# Patient Record
Sex: Female | Born: 1945 | Race: White | Hispanic: No | Marital: Married | Smoking: Former smoker
Health system: Southern US, Community
[De-identification: ages and names within clinical notes are randomized; demographics above are authoritative.]

## PROBLEM LIST (undated history)

## (undated) DIAGNOSIS — Z9221 Personal history of antineoplastic chemotherapy: Secondary | ICD-10-CM

## (undated) DIAGNOSIS — Z9889 Other specified postprocedural states: Secondary | ICD-10-CM

## (undated) DIAGNOSIS — M797 Fibromyalgia: Secondary | ICD-10-CM

## (undated) DIAGNOSIS — J302 Other seasonal allergic rhinitis: Secondary | ICD-10-CM

## (undated) DIAGNOSIS — N393 Stress incontinence (female) (male): Secondary | ICD-10-CM

## (undated) DIAGNOSIS — F32A Depression, unspecified: Secondary | ICD-10-CM

## (undated) DIAGNOSIS — F329 Major depressive disorder, single episode, unspecified: Secondary | ICD-10-CM

## (undated) DIAGNOSIS — H3530 Unspecified macular degeneration: Secondary | ICD-10-CM

## (undated) DIAGNOSIS — R112 Nausea with vomiting, unspecified: Secondary | ICD-10-CM

## (undated) DIAGNOSIS — R208 Other disturbances of skin sensation: Secondary | ICD-10-CM

## (undated) DIAGNOSIS — Z860100 Personal history of colon polyps, unspecified: Secondary | ICD-10-CM

## (undated) DIAGNOSIS — C519 Malignant neoplasm of vulva, unspecified: Secondary | ICD-10-CM

## (undated) DIAGNOSIS — Z973 Presence of spectacles and contact lenses: Secondary | ICD-10-CM

## (undated) DIAGNOSIS — Z853 Personal history of malignant neoplasm of breast: Secondary | ICD-10-CM

## (undated) DIAGNOSIS — M1990 Unspecified osteoarthritis, unspecified site: Secondary | ICD-10-CM

## (undated) DIAGNOSIS — K219 Gastro-esophageal reflux disease without esophagitis: Secondary | ICD-10-CM

## (undated) DIAGNOSIS — R0602 Shortness of breath: Secondary | ICD-10-CM

## (undated) DIAGNOSIS — Z923 Personal history of irradiation: Secondary | ICD-10-CM

## (undated) DIAGNOSIS — E039 Hypothyroidism, unspecified: Secondary | ICD-10-CM

## (undated) DIAGNOSIS — K5909 Other constipation: Secondary | ICD-10-CM

## (undated) DIAGNOSIS — Z8719 Personal history of other diseases of the digestive system: Secondary | ICD-10-CM

## (undated) DIAGNOSIS — J45909 Unspecified asthma, uncomplicated: Secondary | ICD-10-CM

## (undated) DIAGNOSIS — C4499 Other specified malignant neoplasm of skin, unspecified: Secondary | ICD-10-CM

## (undated) DIAGNOSIS — Z8711 Personal history of peptic ulcer disease: Secondary | ICD-10-CM

## (undated) DIAGNOSIS — F419 Anxiety disorder, unspecified: Secondary | ICD-10-CM

## (undated) DIAGNOSIS — R200 Anesthesia of skin: Secondary | ICD-10-CM

## (undated) HISTORY — PX: CARDIAC CATHETERIZATION: SHX172

## (undated) HISTORY — PX: KNEE ARTHROSCOPY: SUR90

## (undated) HISTORY — PX: DIAGNOSTIC LAPAROSCOPY: SUR761

## (undated) HISTORY — PX: ROTATOR CUFF REPAIR: SHX139

## (undated) HISTORY — DX: Fibromyalgia: M79.7

## (undated) HISTORY — DX: Gastro-esophageal reflux disease without esophagitis: K21.9

## (undated) HISTORY — PX: TOTAL KNEE ARTHROPLASTY: SHX125

## (undated) HISTORY — DX: Shortness of breath: R06.02

---

## 1966-05-26 HISTORY — PX: TONSILLECTOMY: SUR1361

## 1966-05-26 HISTORY — PX: DILATION AND CURETTAGE OF UTERUS: SHX78

## 1981-05-26 HISTORY — PX: THYROIDECTOMY, PARTIAL: SHX18

## 1991-05-27 HISTORY — PX: OTHER SURGICAL HISTORY: SHX169

## 1995-05-27 DIAGNOSIS — Z9221 Personal history of antineoplastic chemotherapy: Secondary | ICD-10-CM

## 1995-05-27 HISTORY — PX: MASTECTOMY: SHX3

## 1995-05-27 HISTORY — PX: OTHER SURGICAL HISTORY: SHX169

## 1995-05-27 HISTORY — DX: Personal history of antineoplastic chemotherapy: Z92.21

## 1996-05-26 HISTORY — PX: LUMBAR SPINE SURGERY: SHX701

## 1997-10-16 ENCOUNTER — Ambulatory Visit (HOSPITAL_BASED_OUTPATIENT_CLINIC_OR_DEPARTMENT_OTHER): Admission: RE | Admit: 1997-10-16 | Discharge: 1997-10-16 | Payer: Self-pay | Admitting: Specialist

## 1998-01-24 ENCOUNTER — Ambulatory Visit (HOSPITAL_COMMUNITY): Admission: RE | Admit: 1998-01-24 | Discharge: 1998-01-24 | Payer: Self-pay | Admitting: Family Medicine

## 1999-01-30 ENCOUNTER — Other Ambulatory Visit: Admission: RE | Admit: 1999-01-30 | Discharge: 1999-01-30 | Payer: Self-pay | Admitting: Family Medicine

## 1999-02-06 ENCOUNTER — Ambulatory Visit (HOSPITAL_COMMUNITY): Admission: RE | Admit: 1999-02-06 | Discharge: 1999-02-06 | Payer: Self-pay | Admitting: Family Medicine

## 1999-02-06 ENCOUNTER — Encounter: Payer: Self-pay | Admitting: Family Medicine

## 1999-02-13 ENCOUNTER — Encounter: Payer: Self-pay | Admitting: Family Medicine

## 1999-02-13 ENCOUNTER — Ambulatory Visit (HOSPITAL_COMMUNITY): Admission: RE | Admit: 1999-02-13 | Discharge: 1999-02-13 | Payer: Self-pay | Admitting: Family Medicine

## 1999-04-15 ENCOUNTER — Ambulatory Visit (HOSPITAL_COMMUNITY): Admission: RE | Admit: 1999-04-15 | Discharge: 1999-04-15 | Payer: Self-pay | Admitting: *Deleted

## 1999-06-03 ENCOUNTER — Encounter (INDEPENDENT_AMBULATORY_CARE_PROVIDER_SITE_OTHER): Payer: Self-pay | Admitting: Specialist

## 1999-06-03 ENCOUNTER — Ambulatory Visit (HOSPITAL_BASED_OUTPATIENT_CLINIC_OR_DEPARTMENT_OTHER): Admission: RE | Admit: 1999-06-03 | Discharge: 1999-06-03 | Payer: Self-pay | Admitting: Specialist

## 1999-06-03 HISTORY — PX: OTHER SURGICAL HISTORY: SHX169

## 1999-07-24 ENCOUNTER — Encounter: Admission: RE | Admit: 1999-07-24 | Discharge: 1999-07-24 | Payer: Self-pay | Admitting: Oncology

## 1999-07-24 ENCOUNTER — Encounter: Payer: Self-pay | Admitting: Oncology

## 2000-03-23 ENCOUNTER — Encounter: Payer: Self-pay | Admitting: Oncology

## 2000-03-23 ENCOUNTER — Encounter: Admission: RE | Admit: 2000-03-23 | Discharge: 2000-03-23 | Payer: Self-pay | Admitting: Oncology

## 2000-04-28 ENCOUNTER — Encounter: Payer: Self-pay | Admitting: Orthopedic Surgery

## 2000-04-29 ENCOUNTER — Encounter: Admission: RE | Admit: 2000-04-29 | Discharge: 2000-04-29 | Payer: Self-pay | Admitting: Oncology

## 2000-04-29 ENCOUNTER — Encounter: Payer: Self-pay | Admitting: Oncology

## 2000-05-04 ENCOUNTER — Inpatient Hospital Stay (HOSPITAL_COMMUNITY): Admission: RE | Admit: 2000-05-04 | Discharge: 2000-05-07 | Payer: Self-pay | Admitting: Orthopedic Surgery

## 2000-05-05 ENCOUNTER — Encounter: Payer: Self-pay | Admitting: Orthopedic Surgery

## 2000-05-06 ENCOUNTER — Encounter: Payer: Self-pay | Admitting: Orthopedic Surgery

## 2000-05-07 ENCOUNTER — Inpatient Hospital Stay (HOSPITAL_COMMUNITY)
Admission: RE | Admit: 2000-05-07 | Discharge: 2000-05-11 | Payer: Self-pay | Admitting: Physical Medicine & Rehabilitation

## 2000-06-09 ENCOUNTER — Encounter: Admission: RE | Admit: 2000-06-09 | Discharge: 2000-08-11 | Payer: Self-pay | Admitting: Orthopedic Surgery

## 2000-07-30 ENCOUNTER — Encounter: Payer: Self-pay | Admitting: Oncology

## 2000-07-30 ENCOUNTER — Ambulatory Visit (HOSPITAL_COMMUNITY): Admission: RE | Admit: 2000-07-30 | Discharge: 2000-07-30 | Payer: Self-pay | Admitting: Oncology

## 2000-08-07 ENCOUNTER — Ambulatory Visit (HOSPITAL_COMMUNITY): Admission: RE | Admit: 2000-08-07 | Discharge: 2000-08-07 | Payer: Self-pay | Admitting: Oncology

## 2000-08-07 ENCOUNTER — Encounter: Payer: Self-pay | Admitting: Oncology

## 2000-10-08 ENCOUNTER — Ambulatory Visit: Admission: RE | Admit: 2000-10-08 | Discharge: 2000-10-08 | Payer: Self-pay | Admitting: Orthopedic Surgery

## 2000-10-30 ENCOUNTER — Ambulatory Visit (HOSPITAL_COMMUNITY): Admission: RE | Admit: 2000-10-30 | Discharge: 2000-10-31 | Payer: Self-pay | Admitting: Cardiology

## 2000-12-22 ENCOUNTER — Inpatient Hospital Stay (HOSPITAL_COMMUNITY): Admission: EM | Admit: 2000-12-22 | Discharge: 2000-12-25 | Payer: Self-pay | Admitting: *Deleted

## 2001-03-29 ENCOUNTER — Inpatient Hospital Stay (HOSPITAL_COMMUNITY): Admission: RE | Admit: 2001-03-29 | Discharge: 2001-04-02 | Payer: Self-pay | Admitting: Orthopedic Surgery

## 2001-04-08 ENCOUNTER — Ambulatory Visit: Admission: RE | Admit: 2001-04-08 | Discharge: 2001-04-08 | Payer: Self-pay | Admitting: Orthopedic Surgery

## 2001-04-14 ENCOUNTER — Encounter: Payer: Self-pay | Admitting: Orthopedic Surgery

## 2001-04-14 ENCOUNTER — Inpatient Hospital Stay (HOSPITAL_COMMUNITY): Admission: RE | Admit: 2001-04-14 | Discharge: 2001-04-17 | Payer: Self-pay | Admitting: Orthopedic Surgery

## 2001-04-14 HISTORY — PX: OTHER SURGICAL HISTORY: SHX169

## 2001-04-20 ENCOUNTER — Inpatient Hospital Stay (HOSPITAL_COMMUNITY): Admission: EM | Admit: 2001-04-20 | Discharge: 2001-04-25 | Payer: Self-pay | Admitting: Orthopedic Surgery

## 2001-05-24 ENCOUNTER — Ambulatory Visit: Admission: RE | Admit: 2001-05-24 | Discharge: 2001-05-24 | Payer: Self-pay | Admitting: Orthopedic Surgery

## 2001-06-14 ENCOUNTER — Encounter: Admission: RE | Admit: 2001-06-14 | Discharge: 2001-09-12 | Payer: Self-pay | Admitting: Orthopedic Surgery

## 2001-09-13 ENCOUNTER — Encounter: Admission: RE | Admit: 2001-09-13 | Discharge: 2001-12-12 | Payer: Self-pay | Admitting: Orthopedic Surgery

## 2001-12-09 ENCOUNTER — Encounter: Payer: Self-pay | Admitting: Orthopedic Surgery

## 2001-12-10 ENCOUNTER — Inpatient Hospital Stay (HOSPITAL_COMMUNITY): Admission: RE | Admit: 2001-12-10 | Discharge: 2001-12-18 | Payer: Self-pay | Admitting: Orthopedic Surgery

## 2001-12-13 ENCOUNTER — Encounter: Payer: Self-pay | Admitting: Orthopedic Surgery

## 2001-12-14 ENCOUNTER — Encounter: Payer: Self-pay | Admitting: Orthopedic Surgery

## 2001-12-17 ENCOUNTER — Encounter: Payer: Self-pay | Admitting: Orthopedic Surgery

## 2001-12-30 ENCOUNTER — Encounter: Payer: Self-pay | Admitting: Orthopedic Surgery

## 2001-12-30 ENCOUNTER — Ambulatory Visit (HOSPITAL_BASED_OUTPATIENT_CLINIC_OR_DEPARTMENT_OTHER): Admission: RE | Admit: 2001-12-30 | Discharge: 2001-12-30 | Payer: Self-pay | Admitting: Orthopedic Surgery

## 2002-01-13 ENCOUNTER — Encounter: Payer: Self-pay | Admitting: Orthopedic Surgery

## 2002-01-14 ENCOUNTER — Encounter: Payer: Self-pay | Admitting: Orthopedic Surgery

## 2002-01-14 ENCOUNTER — Inpatient Hospital Stay (HOSPITAL_COMMUNITY): Admission: RE | Admit: 2002-01-14 | Discharge: 2002-01-18 | Payer: Self-pay | Admitting: Orthopedic Surgery

## 2002-01-18 ENCOUNTER — Inpatient Hospital Stay (HOSPITAL_COMMUNITY)
Admission: RE | Admit: 2002-01-18 | Discharge: 2002-01-21 | Payer: Self-pay | Admitting: Physical Medicine & Rehabilitation

## 2002-02-22 ENCOUNTER — Encounter: Admission: RE | Admit: 2002-02-22 | Discharge: 2002-02-22 | Payer: Self-pay | Admitting: Family Medicine

## 2002-02-22 ENCOUNTER — Encounter: Payer: Self-pay | Admitting: Family Medicine

## 2002-07-28 ENCOUNTER — Encounter: Payer: Self-pay | Admitting: Family Medicine

## 2002-07-28 ENCOUNTER — Encounter: Admission: RE | Admit: 2002-07-28 | Discharge: 2002-07-28 | Payer: Self-pay | Admitting: Family Medicine

## 2002-10-27 ENCOUNTER — Other Ambulatory Visit: Admission: RE | Admit: 2002-10-27 | Discharge: 2002-10-27 | Payer: Self-pay | Admitting: *Deleted

## 2002-11-03 ENCOUNTER — Ambulatory Visit (HOSPITAL_COMMUNITY): Admission: RE | Admit: 2002-11-03 | Discharge: 2002-11-03 | Payer: Self-pay | Admitting: *Deleted

## 2002-11-17 ENCOUNTER — Encounter (INDEPENDENT_AMBULATORY_CARE_PROVIDER_SITE_OTHER): Payer: Self-pay | Admitting: Specialist

## 2002-11-17 ENCOUNTER — Ambulatory Visit (HOSPITAL_COMMUNITY): Admission: RE | Admit: 2002-11-17 | Discharge: 2002-11-17 | Payer: Self-pay | Admitting: *Deleted

## 2003-02-24 ENCOUNTER — Encounter: Payer: Self-pay | Admitting: Oncology

## 2003-02-24 ENCOUNTER — Encounter: Admission: RE | Admit: 2003-02-24 | Discharge: 2003-02-24 | Payer: Self-pay | Admitting: Oncology

## 2003-05-27 DIAGNOSIS — Z923 Personal history of irradiation: Secondary | ICD-10-CM

## 2003-05-27 HISTORY — DX: Personal history of irradiation: Z92.3

## 2003-09-25 ENCOUNTER — Encounter: Admission: RE | Admit: 2003-09-25 | Discharge: 2003-09-25 | Payer: Self-pay | Admitting: Orthopedic Surgery

## 2004-03-11 ENCOUNTER — Encounter: Admission: RE | Admit: 2004-03-11 | Discharge: 2004-03-11 | Payer: Self-pay | Admitting: *Deleted

## 2004-03-15 ENCOUNTER — Encounter (INDEPENDENT_AMBULATORY_CARE_PROVIDER_SITE_OTHER): Payer: Self-pay | Admitting: *Deleted

## 2004-03-15 ENCOUNTER — Encounter: Admission: RE | Admit: 2004-03-15 | Discharge: 2004-03-15 | Payer: Self-pay | Admitting: Oncology

## 2004-03-15 ENCOUNTER — Encounter (INDEPENDENT_AMBULATORY_CARE_PROVIDER_SITE_OTHER): Payer: Self-pay | Admitting: Radiology

## 2004-03-19 HISTORY — PX: BREAST BIOPSY: SHX20

## 2004-03-20 ENCOUNTER — Encounter (HOSPITAL_COMMUNITY): Admission: RE | Admit: 2004-03-20 | Discharge: 2004-06-18 | Payer: Self-pay | Admitting: Oncology

## 2004-03-27 ENCOUNTER — Encounter: Admission: RE | Admit: 2004-03-27 | Discharge: 2004-03-27 | Payer: Self-pay | Admitting: General Surgery

## 2004-03-27 ENCOUNTER — Ambulatory Visit (HOSPITAL_COMMUNITY): Admission: RE | Admit: 2004-03-27 | Discharge: 2004-03-27 | Payer: Self-pay | Admitting: General Surgery

## 2004-03-27 ENCOUNTER — Encounter (INDEPENDENT_AMBULATORY_CARE_PROVIDER_SITE_OTHER): Payer: Self-pay | Admitting: *Deleted

## 2004-03-27 ENCOUNTER — Ambulatory Visit (HOSPITAL_BASED_OUTPATIENT_CLINIC_OR_DEPARTMENT_OTHER): Admission: RE | Admit: 2004-03-27 | Discharge: 2004-03-27 | Payer: Self-pay | Admitting: General Surgery

## 2004-03-27 HISTORY — PX: BREAST LUMPECTOMY: SHX2

## 2004-04-09 ENCOUNTER — Ambulatory Visit: Admission: RE | Admit: 2004-04-09 | Discharge: 2004-06-21 | Payer: Self-pay | Admitting: Radiation Oncology

## 2004-04-12 ENCOUNTER — Ambulatory Visit: Payer: Self-pay | Admitting: Oncology

## 2004-05-31 ENCOUNTER — Ambulatory Visit: Payer: Self-pay | Admitting: Oncology

## 2004-07-17 ENCOUNTER — Ambulatory Visit: Admission: RE | Admit: 2004-07-17 | Discharge: 2004-07-17 | Payer: Self-pay | Admitting: Radiation Oncology

## 2004-08-15 ENCOUNTER — Ambulatory Visit: Payer: Self-pay | Admitting: Oncology

## 2004-09-10 ENCOUNTER — Encounter: Admission: RE | Admit: 2004-09-10 | Discharge: 2004-09-10 | Payer: Self-pay | Admitting: Oncology

## 2004-10-17 ENCOUNTER — Ambulatory Visit: Payer: Self-pay | Admitting: Oncology

## 2004-12-19 ENCOUNTER — Ambulatory Visit: Payer: Self-pay | Admitting: Oncology

## 2005-02-13 ENCOUNTER — Ambulatory Visit: Payer: Self-pay | Admitting: Oncology

## 2005-02-20 ENCOUNTER — Encounter: Admission: RE | Admit: 2005-02-20 | Discharge: 2005-02-20 | Payer: Self-pay | Admitting: Radiation Oncology

## 2005-07-01 ENCOUNTER — Encounter: Admission: RE | Admit: 2005-07-01 | Discharge: 2005-07-11 | Payer: Self-pay | Admitting: Orthopedic Surgery

## 2005-07-07 ENCOUNTER — Encounter (INDEPENDENT_AMBULATORY_CARE_PROVIDER_SITE_OTHER): Payer: Self-pay | Admitting: Specialist

## 2005-07-07 ENCOUNTER — Inpatient Hospital Stay (HOSPITAL_COMMUNITY): Admission: RE | Admit: 2005-07-07 | Discharge: 2005-07-11 | Payer: Self-pay | Admitting: Orthopedic Surgery

## 2005-07-07 ENCOUNTER — Ambulatory Visit: Payer: Self-pay | Admitting: Physical Medicine & Rehabilitation

## 2005-07-07 HISTORY — PX: REVISION TOTAL KNEE ARTHROPLASTY: SUR1280

## 2005-08-14 ENCOUNTER — Ambulatory Visit: Payer: Self-pay | Admitting: Oncology

## 2005-09-09 ENCOUNTER — Encounter: Admission: RE | Admit: 2005-09-09 | Discharge: 2005-09-09 | Payer: Self-pay | Admitting: Oncology

## 2005-09-24 ENCOUNTER — Encounter: Admission: RE | Admit: 2005-09-24 | Discharge: 2005-09-24 | Payer: Self-pay | Admitting: Oncology

## 2006-08-11 ENCOUNTER — Ambulatory Visit: Payer: Self-pay | Admitting: Oncology

## 2006-08-13 LAB — CBC WITH DIFFERENTIAL/PLATELET
Basophils Absolute: 0.1 10*3/uL (ref 0.0–0.1)
Eosinophils Absolute: 0.1 10*3/uL (ref 0.0–0.5)
HGB: 13.3 g/dL (ref 11.6–15.9)
MCV: 83.4 fL (ref 81.0–101.0)
MONO%: 6.2 % (ref 0.0–13.0)
NEUT#: 2.5 10*3/uL (ref 1.5–6.5)
Platelets: 156 10*3/uL (ref 145–400)
RDW: 14.1 % (ref 11.3–14.5)

## 2006-08-13 LAB — MORPHOLOGY

## 2006-08-13 LAB — URIC ACID: Uric Acid, Serum: 4.4 mg/dL (ref 2.4–7.0)

## 2006-08-13 LAB — LACTATE DEHYDROGENASE: LDH: 159 U/L (ref 94–250)

## 2006-08-22 ENCOUNTER — Ambulatory Visit (HOSPITAL_COMMUNITY): Admission: RE | Admit: 2006-08-22 | Discharge: 2006-08-22 | Payer: Self-pay | Admitting: Oncology

## 2006-09-14 ENCOUNTER — Encounter: Admission: RE | Admit: 2006-09-14 | Discharge: 2006-09-14 | Payer: Self-pay | Admitting: Oncology

## 2007-03-05 ENCOUNTER — Ambulatory Visit: Payer: Self-pay | Admitting: Oncology

## 2007-03-09 LAB — COMPREHENSIVE METABOLIC PANEL
ALT: 21 U/L (ref 0–35)
AST: 17 U/L (ref 0–37)
Albumin: 4.4 g/dL (ref 3.5–5.2)
Calcium: 9 mg/dL (ref 8.4–10.5)
Chloride: 102 mEq/L (ref 96–112)
Creatinine, Ser: 0.74 mg/dL (ref 0.40–1.20)
Potassium: 4.2 mEq/L (ref 3.5–5.3)

## 2007-03-09 LAB — CBC WITH DIFFERENTIAL/PLATELET
BASO%: 0.5 % (ref 0.0–2.0)
EOS%: 1 % (ref 0.0–7.0)
MCH: 30.1 pg (ref 26.0–34.0)
MCHC: 35.2 g/dL (ref 32.0–36.0)
RDW: 13.5 % (ref 11.3–14.5)
lymph#: 1.2 10*3/uL (ref 0.9–3.3)

## 2007-05-07 ENCOUNTER — Ambulatory Visit (HOSPITAL_COMMUNITY): Admission: RE | Admit: 2007-05-07 | Discharge: 2007-05-07 | Payer: Self-pay | Admitting: Gastroenterology

## 2007-07-19 ENCOUNTER — Observation Stay (HOSPITAL_COMMUNITY): Admission: EM | Admit: 2007-07-19 | Discharge: 2007-07-20 | Payer: Self-pay | Admitting: Emergency Medicine

## 2007-08-04 ENCOUNTER — Encounter: Payer: Self-pay | Admitting: Pulmonary Disease

## 2007-08-23 ENCOUNTER — Ambulatory Visit: Payer: Self-pay | Admitting: Pulmonary Disease

## 2007-08-23 DIAGNOSIS — J45909 Unspecified asthma, uncomplicated: Secondary | ICD-10-CM

## 2007-08-23 DIAGNOSIS — Z85828 Personal history of other malignant neoplasm of skin: Secondary | ICD-10-CM | POA: Insufficient documentation

## 2007-08-23 DIAGNOSIS — R05 Cough: Secondary | ICD-10-CM

## 2007-08-23 DIAGNOSIS — R059 Cough, unspecified: Secondary | ICD-10-CM | POA: Insufficient documentation

## 2007-08-23 DIAGNOSIS — J309 Allergic rhinitis, unspecified: Secondary | ICD-10-CM

## 2007-08-23 HISTORY — DX: Cough, unspecified: R05.9

## 2007-08-23 HISTORY — DX: Allergic rhinitis, unspecified: J30.9

## 2007-08-23 HISTORY — DX: Personal history of other malignant neoplasm of skin: Z85.828

## 2007-08-23 HISTORY — DX: Unspecified asthma, uncomplicated: J45.909

## 2007-09-15 ENCOUNTER — Encounter: Admission: RE | Admit: 2007-09-15 | Discharge: 2007-09-15 | Payer: Self-pay | Admitting: Oncology

## 2007-09-22 ENCOUNTER — Ambulatory Visit: Payer: Self-pay | Admitting: Oncology

## 2007-09-27 LAB — COMPREHENSIVE METABOLIC PANEL
AST: 15 U/L (ref 0–37)
Alkaline Phosphatase: 101 U/L (ref 39–117)
BUN: 18 mg/dL (ref 6–23)
Calcium: 9.2 mg/dL (ref 8.4–10.5)
Creatinine, Ser: 0.75 mg/dL (ref 0.40–1.20)

## 2007-09-27 LAB — CBC WITH DIFFERENTIAL/PLATELET
Basophils Absolute: 0 10*3/uL (ref 0.0–0.1)
EOS%: 1.2 % (ref 0.0–7.0)
HCT: 37.5 % (ref 34.8–46.6)
HGB: 12.8 g/dL (ref 11.6–15.9)
LYMPH%: 25.3 % (ref 14.0–48.0)
MCH: 29.4 pg (ref 26.0–34.0)
MCV: 86.4 fL (ref 81.0–101.0)
MONO%: 6.6 % (ref 0.0–13.0)
NEUT%: 66.3 % (ref 39.6–76.8)

## 2007-09-28 ENCOUNTER — Encounter: Payer: Self-pay | Admitting: Family Medicine

## 2007-09-29 ENCOUNTER — Encounter: Admission: RE | Admit: 2007-09-29 | Discharge: 2007-09-29 | Payer: Self-pay | Admitting: Family Medicine

## 2007-10-01 ENCOUNTER — Ambulatory Visit: Payer: Self-pay | Admitting: Pulmonary Disease

## 2007-12-15 ENCOUNTER — Encounter: Payer: Self-pay | Admitting: Pulmonary Disease

## 2008-02-18 ENCOUNTER — Emergency Department (HOSPITAL_COMMUNITY): Admission: EM | Admit: 2008-02-18 | Discharge: 2008-02-18 | Payer: Self-pay | Admitting: Emergency Medicine

## 2008-02-28 ENCOUNTER — Encounter: Admission: RE | Admit: 2008-02-28 | Discharge: 2008-02-28 | Payer: Self-pay | Admitting: Orthopedic Surgery

## 2008-03-31 ENCOUNTER — Ambulatory Visit: Payer: Self-pay | Admitting: Oncology

## 2008-04-04 LAB — COMPREHENSIVE METABOLIC PANEL
AST: 20 U/L (ref 0–37)
Albumin: 4.3 g/dL (ref 3.5–5.2)
Alkaline Phosphatase: 81 U/L (ref 39–117)
BUN: 14 mg/dL (ref 6–23)
Glucose, Bld: 128 mg/dL — ABNORMAL HIGH (ref 70–99)
Potassium: 4.6 mEq/L (ref 3.5–5.3)
Total Bilirubin: 0.5 mg/dL (ref 0.3–1.2)

## 2008-04-04 LAB — CBC WITH DIFFERENTIAL/PLATELET
Basophils Absolute: 0 10*3/uL (ref 0.0–0.1)
EOS%: 0.9 % (ref 0.0–7.0)
HGB: 13 g/dL (ref 11.6–15.9)
LYMPH%: 25.6 % (ref 14.0–48.0)
MCH: 29.7 pg (ref 26.0–34.0)
MCV: 87 fL (ref 81.0–101.0)
MONO%: 5.8 % (ref 0.0–13.0)
Platelets: 167 10*3/uL (ref 145–400)
RBC: 4.38 10*6/uL (ref 3.70–5.32)
RDW: 14 % (ref 11.3–14.5)

## 2008-08-20 ENCOUNTER — Emergency Department (HOSPITAL_COMMUNITY): Admission: EM | Admit: 2008-08-20 | Discharge: 2008-08-20 | Payer: Self-pay | Admitting: Emergency Medicine

## 2008-11-03 ENCOUNTER — Emergency Department (HOSPITAL_COMMUNITY): Admission: EM | Admit: 2008-11-03 | Discharge: 2008-11-03 | Payer: Self-pay | Admitting: Emergency Medicine

## 2009-02-01 ENCOUNTER — Inpatient Hospital Stay (HOSPITAL_COMMUNITY): Admission: RE | Admit: 2009-02-01 | Discharge: 2009-02-05 | Payer: Self-pay | Admitting: Orthopedic Surgery

## 2009-02-01 HISTORY — PX: FOOT SURGERY: SHX648

## 2009-04-13 ENCOUNTER — Ambulatory Visit: Payer: Self-pay | Admitting: Oncology

## 2009-04-16 ENCOUNTER — Telehealth (INDEPENDENT_AMBULATORY_CARE_PROVIDER_SITE_OTHER): Payer: Self-pay | Admitting: *Deleted

## 2009-04-17 LAB — CBC WITH DIFFERENTIAL/PLATELET
BASO%: 0.5 % (ref 0.0–2.0)
EOS%: 1 % (ref 0.0–7.0)
HGB: 12.7 g/dL (ref 11.6–15.9)
MCH: 28.5 pg (ref 25.1–34.0)
MCHC: 33.2 g/dL (ref 31.5–36.0)
RBC: 4.47 10*6/uL (ref 3.70–5.45)
RDW: 14.1 % (ref 11.2–14.5)
lymph#: 1.2 10*3/uL (ref 0.9–3.3)

## 2009-04-17 LAB — COMPREHENSIVE METABOLIC PANEL
ALT: 18 U/L (ref 0–35)
AST: 19 U/L (ref 0–37)
Albumin: 4.1 g/dL (ref 3.5–5.2)
Alkaline Phosphatase: 102 U/L (ref 39–117)
Calcium: 9.4 mg/dL (ref 8.4–10.5)
Chloride: 103 mEq/L (ref 96–112)
Potassium: 4.3 mEq/L (ref 3.5–5.3)

## 2009-04-20 ENCOUNTER — Encounter: Admission: RE | Admit: 2009-04-20 | Discharge: 2009-04-20 | Payer: Self-pay | Admitting: Oncology

## 2009-04-25 ENCOUNTER — Encounter: Admission: RE | Admit: 2009-04-25 | Discharge: 2009-04-25 | Payer: Self-pay | Admitting: Oncology

## 2009-05-15 ENCOUNTER — Encounter: Admission: RE | Admit: 2009-05-15 | Discharge: 2009-05-15 | Payer: Self-pay | Admitting: Orthopedic Surgery

## 2009-05-23 ENCOUNTER — Ambulatory Visit (HOSPITAL_COMMUNITY): Admission: RE | Admit: 2009-05-23 | Discharge: 2009-05-23 | Payer: Self-pay | Admitting: Orthopedic Surgery

## 2009-05-26 HISTORY — PX: OTHER SURGICAL HISTORY: SHX169

## 2009-11-09 ENCOUNTER — Ambulatory Visit: Payer: Self-pay | Admitting: Pulmonary Disease

## 2009-11-09 DIAGNOSIS — K219 Gastro-esophageal reflux disease without esophagitis: Secondary | ICD-10-CM | POA: Insufficient documentation

## 2009-11-19 ENCOUNTER — Telehealth (INDEPENDENT_AMBULATORY_CARE_PROVIDER_SITE_OTHER): Payer: Self-pay | Admitting: *Deleted

## 2009-11-20 ENCOUNTER — Encounter: Payer: Self-pay | Admitting: Pulmonary Disease

## 2010-04-15 ENCOUNTER — Ambulatory Visit: Payer: Self-pay | Admitting: Oncology

## 2010-04-16 LAB — CBC WITH DIFFERENTIAL/PLATELET
BASO%: 0.3 % (ref 0.0–2.0)
EOS%: 1.7 % (ref 0.0–7.0)
MCH: 29.9 pg (ref 25.1–34.0)
MCHC: 34.7 g/dL (ref 31.5–36.0)
RDW: 13.6 % (ref 11.2–14.5)
lymph#: 1 10*3/uL (ref 0.9–3.3)

## 2010-04-16 LAB — COMPREHENSIVE METABOLIC PANEL
ALT: 217 U/L — ABNORMAL HIGH (ref 0–35)
AST: 77 U/L — ABNORMAL HIGH (ref 0–37)
Albumin: 4.4 g/dL (ref 3.5–5.2)
Calcium: 9.4 mg/dL (ref 8.4–10.5)
Chloride: 103 mEq/L (ref 96–112)
Potassium: 4.3 mEq/L (ref 3.5–5.3)
Sodium: 139 mEq/L (ref 135–145)
Total Protein: 6.3 g/dL (ref 6.0–8.3)

## 2010-04-22 ENCOUNTER — Encounter: Admission: RE | Admit: 2010-04-22 | Discharge: 2010-04-22 | Payer: Self-pay | Admitting: Oncology

## 2010-04-24 LAB — HEPATIC FUNCTION PANEL
ALT: 50 U/L — ABNORMAL HIGH (ref 0–35)
AST: 28 U/L (ref 0–37)
Bilirubin, Direct: 0.1 mg/dL (ref 0.0–0.3)
Indirect Bilirubin: 0.2 mg/dL (ref 0.0–0.9)

## 2010-04-24 LAB — HEPATITIS PANEL, ACUTE: Hep A IgM: NEGATIVE

## 2010-04-25 ENCOUNTER — Ambulatory Visit (HOSPITAL_COMMUNITY)
Admission: RE | Admit: 2010-04-25 | Discharge: 2010-04-25 | Payer: Self-pay | Source: Home / Self Care | Admitting: Oncology

## 2010-05-07 ENCOUNTER — Encounter
Admission: RE | Admit: 2010-05-07 | Discharge: 2010-05-07 | Payer: Self-pay | Source: Home / Self Care | Attending: Gastroenterology | Admitting: Gastroenterology

## 2010-06-16 ENCOUNTER — Encounter: Payer: Self-pay | Admitting: Oncology

## 2010-06-16 ENCOUNTER — Encounter: Payer: Self-pay | Admitting: Gastroenterology

## 2010-06-17 LAB — URINALYSIS, ROUTINE W REFLEX MICROSCOPIC
Bilirubin Urine: NEGATIVE
Hgb urine dipstick: NEGATIVE
Ketones, ur: NEGATIVE mg/dL
Nitrite: NEGATIVE
Protein, ur: NEGATIVE mg/dL
Specific Gravity, Urine: 1.014 (ref 1.005–1.030)
Urine Glucose, Fasting: NEGATIVE mg/dL
Urobilinogen, UA: 0.2 mg/dL (ref 0.0–1.0)
pH: 7 (ref 5.0–8.0)

## 2010-06-17 LAB — URINE MICROSCOPIC-ADD ON

## 2010-06-17 LAB — COMPREHENSIVE METABOLIC PANEL
ALT: 21 U/L (ref 0–35)
AST: 25 U/L (ref 0–37)
Albumin: 4.2 g/dL (ref 3.5–5.2)
Alkaline Phosphatase: 68 U/L (ref 39–117)
BUN: 13 mg/dL (ref 6–23)
CO2: 32 mEq/L (ref 19–32)
Calcium: 9.6 mg/dL (ref 8.4–10.5)
Chloride: 103 mEq/L (ref 96–112)
Creatinine, Ser: 0.71 mg/dL (ref 0.4–1.2)
GFR calc Af Amer: >60 mL/min (ref >60)
GFR calc non Af Amer: >60 mL/min (ref >60)
Glucose, Bld: 99 mg/dL (ref 70–99)
Potassium: 4.5 mEq/L (ref 3.5–5.1)
Sodium: 142 mEq/L (ref 135–145)
Total Bilirubin: 0.8 mg/dL (ref 0.3–1.2)
Total Protein: 7.1 g/dL (ref 6.0–8.3)

## 2010-06-17 LAB — DIFFERENTIAL
Eosinophils Relative: 2 % (ref 0–5)
Lymphs Abs: 1 10*3/uL (ref 0.7–4.0)
Monocytes Relative: 7 % (ref 3–12)
Neutro Abs: 2.7 10*3/uL (ref 1.7–7.7)

## 2010-06-17 LAB — SURGICAL PCR SCREEN
MRSA, PCR: NEGATIVE
Staphylococcus aureus: NEGATIVE

## 2010-06-17 LAB — CBC
Platelets: 127 10*3/uL — ABNORMAL LOW (ref 150–400)
RDW: 13.8 % (ref 11.5–15.5)

## 2010-06-21 ENCOUNTER — Ambulatory Visit (HOSPITAL_COMMUNITY)
Admission: RE | Admit: 2010-06-21 | Discharge: 2010-06-22 | Payer: Self-pay | Source: Home / Self Care | Attending: General Surgery | Admitting: General Surgery

## 2010-06-21 HISTORY — PX: LAPAROSCOPIC CHOLECYSTECTOMY: SUR755

## 2010-06-24 NOTE — Op Note (Signed)
NAMEORISSA, ARREAGA                ACCOUNT NO.:  [id number]  MEDICAL RECORD NO.:  [id number]          PATIENT TYPE:  AMB  LOCATION:  DAY                          FACILITY:  [facility]  PHYSICIAN:  Sharlet Salina T. Hoxworth, M.D.DATE OF BIRTH:  [birth year]  DATE OF PROCEDURE:  06/21/2010 DATE OF DISCHARGE:                              OPERATIVE REPORT   PREOPERATIVE DIAGNOSIS:  Chronic cholecystitis, intermittent elevated liver function tests.  POSTOPERATIVE DIAGNOSIS:  Chronic cholecystitis, intermittent elevated liver function tests.  SURGICAL PROCEDURES:  Laparoscopic cholecystectomy with intraoperative cholangiogram.  SURGEON:  Sharlet Salina T. Hoxworth, M.D.  ANESTHESIA:  General.  BRIEF HISTORY:  Ms. Lieurance is a 65 year old female with a history of morbid obesity and breast cancer.  She has been noted recently to have elevated LFTs with moderately elevated transaminases, mildly elevated alkaline phosphatase and normal bilirubin.  This has coincided with episodes of fairly severe acute right upper quadrant abdominal pain. She has had an extensive workup including CT scan of the abdomen and pelvis which was unremarkable as well as a gallbladder ultrasound which was negative.  She, however, has had continued discomfort and tenderness in the right upper quadrant.  She is felt to have possibly chronic cholecystitis or small stones not seen on ultrasound and with this constellation of findings, after extensive discussion regarding pros and cons, we have elected to proceed with laparoscopic cholecystectomy with cholangiogram in an effort to relieve her symptoms.  The nature of the procedure, its indications, risks of bleeding, infection, bile leak, bile duct injury, anesthetic complications and failure to relieve symptoms have been discussed and understood.  She is now brought to the operating room for this procedure.  DESCRIPTION OF OPERATION:  The patient was brought to the  operating room, placed in the supine position on the operating table and general endotracheal anesthesia was induced.  She received subcutaneous heparin and preoperative IV antibiotics.  The abdomen was widely sterilely prepped and draped.  PAS were in place.  The correct patient and procedure were verified.  Due to morbid obesity, I made an incision above the umbilicus in the midline about 2 cm and dissection was carried down through the subcutaneous tissue to the midline fascia which was incised for 1 cm under direct vision and the peritoneum entered without difficulty.  Through a mattress suture of 0 Vicryl the Hasson trocar was placed and pneumoperitoneum established.  Under direct vision an 11-mm trocar was placed subxiphoid and two 5-mm trocars along the right subcostal margin.  The gallbladder did not appear particularly inflamed, although there were some adhesions up to the infundibulum.  The fundus was grasped and elevated up over the liver.  The exposure was a little difficult but with a 30 degrees scope we were able to see theinfundibulum and closed triangle well.  The infundibulum was retracted inferolaterally and peritoneum anterior and posterior to closed triangle was incised.  Fibrofatty tissue was stripped off the neck of the gallbladder toward the porta hepatis.  The distal gallbladder was thoroughly dissected.  The cystic artery was identified and skeletonized in closed triangle and clearly seen coursing up on the gallbladder wall.  It was then divided between two proximal and one distal clip.  The cystic duct was further dissected and when the anatomy was clear the cystic duct was clipped at the gallbladder junction and an operative cholangiogram was obtained through the cystic duct.  This showed good filling of a normal common bile duct and intrahepatic ducts with free flow into the duodenum and no filling defects or obstruction.  The pancreatic duct was also visualized  proximally.  Cholangiocath was removed and the cystic duct was triply clipped and proximally divided. The gallbladder was then dissected free from its bed using hook cautery. It was then placed in an EndoCatch bag and brought out through the umbilicus.  The right upper quadrant was thoroughly irrigated and suctioned until clear.  There was some mild bleeding in the gallbladder bed that was controlled with cautery and then a Surgicel pack placed as well.  At this point, there was no evidence of bleeding, trocar injury or other problem.  All CO2 was evacuated and trocars were removed and the mattress suture was secured at the umbilicus.  Skin incisions were closed with subcuticular Monocryl and Dermabond.  Sponge, needle, and instrument counts were correct.  The patient was taken to recovery in good condition.     Lorne Skeens. Hoxworth, M.D.     Tory Emerald  D:  06/21/2010  T:  06/21/2010  Job:  [id number]  cc:   Griffith Citron, M.D. Fax: [phone]  Genene Churn. Cyndie Chime, M.D. Fax: [phone]  Electronically Signed by Glenna Fellows M.D. on 06/24/2010 02:04:18 PM

## 2010-06-25 NOTE — Assessment & Plan Note (Signed)
Summary: rov for cough related to LPR   Copy to:  [facility] Primary Provider/Referring Provider:  Herb Grays  CC:  Pt is here for a f/u appt.  Pt was last seen May 2009.  Pt requesting refill on Zegerid.  Pt states occ cough esp when eating.  Pt states she will cough up sputum- unsure of color.  History of Present Illness: the pt comes in today for f/u of her known chronic cough related to reflux disease.  She has done better with zegerid, but does still have a cough at times while eating.  She still has intermittant reflux that sometimes is associated with regurgitation of food.  She has not seen Dr. Kinnie Scales in awhile.  She denies any chest congestion or change in her exertional tolerance.  Current Medications (verified): 1)  Spironolactone 50 Mg Tabs (Spironolactone) .... Take 1/2 To 1 Tab By Mouth As Needed For Swelling 2)  Zegerid 40-1100 Mg  Caps (Omeprazole-Sodium Bicarbonate) .... Take 1 Tablet By Mouth Two Times A Day 3)  Celexa 40 Mg  Tabs (Citalopram Hydrobromide) .... Take 1 1/2 Tabs By Mouth Once Daily 4)  Femara 2.5 Mg  Tabs (Letrozole) .... Take 1 Tablet By Mouth Once A Day 5)  Combivent 103-18 Mcg/act  Aero (Ipratropium-Albuterol) .... Inhale 2 Puffs Every 6 Hours 6)  Wellbutrin Xl 150 Mg Xr24h-Tab (Bupropion Hcl) .... Take 1 Tablet By Mouth Once A Day 7)  Calcium +vitamin D .... Take 2 Tabs By Mouth Daily 8)  Multivitamins   Tabs (Multiple Vitamin) .... Take 1 Tablet By Mouth Once A Day 9)  Albuterol Neb .... Use As Needed 10)  Levothyroxine Sodium 150 Mcg Tabs (Levothyroxine Sodium) .... Take 1 Tablet By Mouth Once A Day 11)  Tussionex Pennkinetic Er 8-10 Mg/82ml  Lqcr (Chlorpheniramine-Hydrocodone) .... 5 Cc By Mouth Twice Daily 12)  Tessalon Perles 100 Mg  Caps (Benzonatate) .... One To Two By Mouth 3-4 Times Daily 13)  Ocu Support Antioxidant Powder 10mg  .... Take 1 Tablet By Mouth Three Times A Day 14)  Pro Omega .... Take 2 Tabs Daily  Allergies (verified): 1)  !  Penicillin 2)  ! Valium 3)  ! Adhesive Tape  Review of Systems       The patient complains of productive cough, non-productive cough, acid heartburn, indigestion, and hand/feet swelling.  The patient denies shortness of breath at rest, coughing up blood, chest pain, irregular heartbeats, loss of appetite, weight change, abdominal pain, difficulty swallowing, sore throat, tooth/dental problems, headaches, nasal congestion/difficulty breathing through nose, sneezing, itching, ear ache, anxiety, depression, joint stiffness or pain, rash, change in color of mucus, fever, and shortness of breath with activity.    Vital Signs:  Patient profile:   65 year old female Height:      64 inches Weight:      258 pounds BMI:     44.45 O2 Sat:      90 % on Room air Temp:     98.0 degrees F oral Pulse rate:   92 / minute BP sitting:   118 / 76  (left arm) Cuff size:   large  Vitals Entered By: Arman Filter LPN (November 09, 2009 10:56 AM)  O2 Flow:  Room air  Physical Exam  General:  obese female in nad Nose:  no obvious drainage or purulence. Lungs:  totally clear to auscultation Heart:  rrr Extremities:  no significant edema or cyanosis Neurologic:  alert and oriented, moves all 4.   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  She has done well with zegerid, but is currently having symptoms of ongoing reflux with regurgitation of food.  She obviously needs to get back with her gastroenterologist, and I have asked her to do so.  I have given her a prescription for zegerid, but asked her to get this filled in the future thru GI or her primary md.  I would be happy to see her again if worsening or if new pulmonary issues arise.  Medications Added to Medication List This Visit: 1)  Spironolactone 50 Mg Tabs (Spironolactone) .... Take 1/2 to 1 tab by mouth as needed for swelling 2)  Wellbutrin Xl 150 Mg Xr24h-tab  (Bupropion hcl) .... Take 1 tablet by mouth once a day 3)  Calcium +vitamin D  .... Take 2 tabs by mouth daily 4)  Levothyroxine Sodium 150 Mcg Tabs (Levothyroxine sodium) .... Take 1 tablet by mouth once a day 5)  Ocu Support Antioxidant Powder 10mg   .... Take 1 tablet by mouth three times a day 6)  Pro Omega  .... Take 2 tabs daily  Other Orders: Est. Patient Level III (46962)  Patient Instructions: 1)  will refill your zegerid, but will send a note to your primary md asking if they can fill in the future for you. 2)  you need to make an apptm with Dr. Kinnie Scales regarding your ongoing reflux with regurgitation 3)  followup with me as needed.  Prescriptions: ZEGERID 40-1100 MG  CAPS (OMEPRAZOLE-SODIUM BICARBONATE) Take 1 tablet by mouth two times a day  #60 Capsule x 3   Entered and Authorized by:   Barbaraann Share MD   Signed by:   Barbaraann Share MD on 11/09/2009   Method used:   Print then Give to Patient   RxID:   [id number]     Immunization History:  Influenza Immunization History:    Influenza:  historical (05/26/2009)  Pneumovax Immunization History:    Pneumovax:  historical (05/26/2009)

## 2010-06-25 NOTE — Progress Notes (Signed)
Summary: rx/PA  Phone Note Call from Patient Call back at Home Phone [phone]   Caller: Patient Call For: clance Reason for Call: Talk to Nurse Summary of Call: pt's insurance company won't let her have Zegerid without PA.    Pt takes two times a day and nothing else works.  She has been taking OTC Prilosec and it is not working.  [facility] said they sent fax to [facility] on Friday fro [place].  Have we rec'd Initial call taken by: Eugene Gavia,  November 19, 2009 3:42 PM  Follow-up for Phone Call        Advised pt that we never recieived PA form and that she should have the pharmacy re-fax this. Pt verbalized understanding. Follow-up by: Vernie Murders,  November 19, 2009 3:55 PM

## 2010-06-25 NOTE — Miscellaneous (Signed)
Summary: change omeprazole to prilosec  Clinical Lists Changes  Medications: Changed medication from ZEGERID 40-1100 MG  CAPS (OMEPRAZOLE-SODIUM BICARBONATE) Take 1 tablet by mouth two times a day to PRILOSEC 40 MG CPDR (OMEPRAZOLE) Take 1 tablet by mouth two times a day - Signed Rx of PRILOSEC 40 MG CPDR (OMEPRAZOLE) Take 1 tablet by mouth two times a day;  #60 x 3;  Signed;  Entered by: Arman Filter LPN;  Authorized by: Barbaraann Share MD;  Method used: Electronically to [facility] [address] [place], [place], [place]  [address], Ph: [phone], Fax: [phone]  received prior auth request from [facility] pharmacy for pt's Omeprazole/Sodium 40-1100mg .  [facility] ok'd to change rx to Prilosec 40mg  two times a day.  Rx sent to pharmacy. Aundra Millet Reynolds LPN  November 20, 2009 2:44 PM   Prescriptions: PRILOSEC 40 MG CPDR (OMEPRAZOLE) Take 1 tablet by mouth two times a day  #60 x 3   Entered by:   Arman Filter LPN   Authorized by:   Barbaraann Share MD   Signed by:   Arman Filter LPN on 76/72/0947   Method used:   Electronically to        [facility]* (retail)       [address]       [place], [place]  [address]       Ph: [phone]       Fax: [phone]   RxID:   [id number]

## 2010-07-10 ENCOUNTER — Encounter (HOSPITAL_COMMUNITY): Payer: Medicare Other

## 2010-07-10 ENCOUNTER — Ambulatory Visit: Payer: Medicare Other | Attending: Gynecologic Oncology | Admitting: Gynecologic Oncology

## 2010-07-10 DIAGNOSIS — Z79899 Other long term (current) drug therapy: Secondary | ICD-10-CM | POA: Insufficient documentation

## 2010-07-10 DIAGNOSIS — R32 Unspecified urinary incontinence: Secondary | ICD-10-CM | POA: Insufficient documentation

## 2010-07-10 DIAGNOSIS — Z853 Personal history of malignant neoplasm of breast: Secondary | ICD-10-CM | POA: Insufficient documentation

## 2010-07-10 DIAGNOSIS — Z01812 Encounter for preprocedural laboratory examination: Secondary | ICD-10-CM | POA: Insufficient documentation

## 2010-07-10 DIAGNOSIS — C44599 Other specified malignant neoplasm of skin of other part of trunk: Secondary | ICD-10-CM | POA: Insufficient documentation

## 2010-07-10 DIAGNOSIS — N840 Polyp of corpus uteri: Secondary | ICD-10-CM | POA: Insufficient documentation

## 2010-07-10 DIAGNOSIS — E890 Postprocedural hypothyroidism: Secondary | ICD-10-CM | POA: Insufficient documentation

## 2010-07-10 DIAGNOSIS — Z9010 Acquired absence of unspecified breast and nipple: Secondary | ICD-10-CM | POA: Insufficient documentation

## 2010-07-10 DIAGNOSIS — L293 Anogenital pruritus, unspecified: Secondary | ICD-10-CM | POA: Insufficient documentation

## 2010-07-10 LAB — CBC
HCT: 36.5 % (ref 36.0–46.0)
MCH: 28.6 pg (ref 26.0–34.0)
MCV: 88.4 fL (ref 78.0–100.0)
RDW: 13.5 % (ref 11.5–15.5)
WBC: 4.2 10*3/uL (ref 4.0–10.5)

## 2010-07-10 LAB — COMPREHENSIVE METABOLIC PANEL
Alkaline Phosphatase: 84 U/L (ref 39–117)
BUN: 14 mg/dL (ref 6–23)
Creatinine, Ser: 0.71 mg/dL (ref 0.4–1.2)
Glucose, Bld: 117 mg/dL — ABNORMAL HIGH (ref 70–99)
Potassium: 4.3 mEq/L (ref 3.5–5.1)
Total Bilirubin: 0.4 mg/dL (ref 0.3–1.2)
Total Protein: 6.2 g/dL (ref 6.0–8.3)

## 2010-07-10 LAB — SURGICAL PCR SCREEN
MRSA, PCR: NEGATIVE
Staphylococcus aureus: NEGATIVE

## 2010-07-11 ENCOUNTER — Other Ambulatory Visit: Payer: Self-pay | Admitting: Oncology

## 2010-07-11 DIAGNOSIS — Z9889 Other specified postprocedural states: Secondary | ICD-10-CM

## 2010-07-11 DIAGNOSIS — Z9011 Acquired absence of right breast and nipple: Secondary | ICD-10-CM

## 2010-07-16 ENCOUNTER — Ambulatory Visit (HOSPITAL_COMMUNITY)
Admission: RE | Admit: 2010-07-16 | Discharge: 2010-07-16 | Disposition: A | Discharge status: Home / Self Care | Payer: Medicare Other | Source: Ambulatory Visit | Attending: Obstetrics and Gynecology | Admitting: Obstetrics and Gynecology

## 2010-07-16 ENCOUNTER — Other Ambulatory Visit: Payer: Self-pay | Admitting: Obstetrics and Gynecology

## 2010-07-16 DIAGNOSIS — J449 Chronic obstructive pulmonary disease, unspecified: Secondary | ICD-10-CM | POA: Insufficient documentation

## 2010-07-16 DIAGNOSIS — J4489 Other specified chronic obstructive pulmonary disease: Secondary | ICD-10-CM | POA: Insufficient documentation

## 2010-07-16 DIAGNOSIS — Z01812 Encounter for preprocedural laboratory examination: Secondary | ICD-10-CM | POA: Insufficient documentation

## 2010-07-16 DIAGNOSIS — E669 Obesity, unspecified: Secondary | ICD-10-CM | POA: Insufficient documentation

## 2010-07-16 DIAGNOSIS — N840 Polyp of corpus uteri: Secondary | ICD-10-CM | POA: Insufficient documentation

## 2010-07-16 DIAGNOSIS — C44599 Other specified malignant neoplasm of skin of other part of trunk: Secondary | ICD-10-CM | POA: Insufficient documentation

## 2010-07-16 DIAGNOSIS — E039 Hypothyroidism, unspecified: Secondary | ICD-10-CM | POA: Insufficient documentation

## 2010-07-16 DIAGNOSIS — K219 Gastro-esophageal reflux disease without esophagitis: Secondary | ICD-10-CM | POA: Insufficient documentation

## 2010-07-16 DIAGNOSIS — Z853 Personal history of malignant neoplasm of breast: Secondary | ICD-10-CM | POA: Insufficient documentation

## 2010-07-16 HISTORY — PX: VULVECTOMY PARTIAL: SHX6187

## 2010-07-17 NOTE — Op Note (Signed)
  [name], REPPUCCI                ACCOUNT NO.:  [id number]  MEDICAL RECORD NO.:  [id number]           PATIENT TYPE:  O  LOCATION:  DAYL                         FACILITY:  [facility]  PHYSICIAN:  De Blanch, M.D.DATE OF BIRTH:  06-Jul-1945  DATE OF PROCEDURE: DATE OF DISCHARGE:  07/16/2010                              OPERATIVE REPORT   PREOPERATIVE DIAGNOSIS:  Paget disease of the vulva.  POSTOPERATIVE DIAGNOSIS:  Paget disease of the vulva.  PROCEDURE:  Partial vulvectomy.  SURGEON:  De Blanch, MD  ASSISTANT:  [name], R.N.  ANESTHESIA:  General orotracheal tube.  ESTIMATED BLOOD LOSS:  50 cc.  SURGICAL FINDINGS:  Examination under anesthesia revealed a raised red and white lesion of the left posterior vulva measuring approximately 5 x 7 cm.  Some of the other areas of the vulva were edematous and inflamed, but did not appear to be Paget disease.  DESCRIPTION OF PROCEDURE:  The patient was brought to the operating room and after satisfactory attainment of general anesthesia, she was placed in modified lithotomy position in [place] stirrups.  Dr. Dois Davenport Rivard proceeded to perform a hysteroscopy and D and C.  The vulvar lesion was identified and acetic acid was applied to further delineate the margins.  Taking approximately 1 cm margin, an elliptical incision was made encompassing the entire lesion.  The lesion was then excised including a small portion of subcutaneous fat.  Bleeding sites were controlled with cautery.  The subcutaneous fat was reapproximated with interrupted sutures of 2-0 Vicryl.  The skin was closed interrupted sutures of 3-0 Vicryl. Hemostasis at the end of the procedure was excellent.  A ice pack was placed on the vulva and the patient was awakened from anesthesia and taken to the recovery room in satisfactory condition.  Sponge, needle, and instruments counts were correct x2.     De Blanch,  M.D.     DC/MEDQ  D:  07/16/2010  T:  07/17/2010  Job:  [id number]  cc:   Dois Davenport A. Rivard, M.D. Fax: [phone]  [name], R.N. [address] [place], [place] [address]  Electronically Signed by De Blanch M.D. on 07/17/2010 11:23:53 AM

## 2010-07-19 NOTE — Op Note (Signed)
  NAMESHATERIA, PATERNOSTRO                ACCOUNT NO.:  [id number]  MEDICAL RECORD NO.:  [id number]           PATIENT TYPE:  O  LOCATION:  PADM                         FACILITY:  [facility]  PHYSICIAN:  Crist Fat. Charrie Mcconnon, M.D. DATE OF BIRTH:  Sep 30, 1945  DATE OF PROCEDURE: DATE OF DISCHARGE:  07/10/2010                              OPERATIVE REPORT   PREOPERATIVE DIAGNOSIS:  Postmenopausal bleeding.  POSTOPERATIVE DIAGNOSIS:  Postmenopausal bleeding.  ANESTHESIA:  General.  ANESTHESIOLOGIST:  Jill Side, M.D.  PROCEDURE:  Hysteroscopy D and C.  SURGEON:  Ketrina Boateng A. Gicela Schwarting, M.D.  ASSISTANT:  No assistant.  ESTIMATED BLOOD LOSS:  Minimal.  DESCRIPTION OF PROCEDURE:  After being informed of the planned procedure with possible complications including bleeding, infection, and injury to uterus, informed consent was obtained.  The patient is taken to OR #12, given general anesthesia with endotracheal intubation without any complication.  She is placed in lithotomy position, prepped and draped in a sterile fashion with knee-high sequential compressive devices.  Her bladder was emptied with an in-and-out red rubber catheter.  Pelvic exam is greatly limited by body habitus, but reveals a normal uterus with adnexa that are not felt.  A weighted speculum is inserted in the vagina.  Anterior lip of the cervix was grasped with a tenaculum forceps, and we proceeded with a paracervical block using Novocain 1% 18 cc in the usual fashion.  Uterus was then sounded at 7 cm, and the cervix was easily dilated using Hegar dilator until #33, which allowed easy entry of an operative hysteroscope with resectoscope.  Using glycine perfusion at a maximum pressure of 80 mmHg, we are able to easily visualize the entire uterine cavity and both tubal ostia.  To our surprise, we see a large dense synechia midline about 2 cm below the fundus.  We are able to go around the synechia and visualize the  entire endometrium, which is greatly atrophic.  Instruments are then removed and a sharp curette is used to remove some of endometrial tissue for further pathological diagnosis.  Endocervical curettage is also performed.  Instruments and sponge count is complete x2.  Estimated blood loss is minimal.  The procedure is well tolerated by the patient who is then left to the care of Dr. De Blanch to proceed with a wide vulvar excision due to Paget disease.  SPECIMEN:  Endometrial curettings and endocervical curettings sent to Pathology.     Crist Fat Staphanie Harbison, M.D.     SAR/MEDQ  D:  07/16/2010  T:  07/17/2010  Job:  [id number]  Electronically Signed by Silverio Lay M.D. on 07/19/2010 06:27:14 PM

## 2010-07-22 NOTE — Op Note (Signed)
  NAMEJENEL, GIERKE                ACCOUNT NO.:  [id number]  MEDICAL RECORD NO.:  [id number]          PATIENT TYPE:  OIB  LOCATION:  1531                         FACILITY:  [facility]  PHYSICIAN:  Sharlet Salina T. Juris Gosnell, M.D.DATE OF BIRTH:  12/20/1945  DATE OF PROCEDURE:  06/21/2010 DATE OF DISCHARGE:  06/22/2010                              OPERATIVE REPORT   ADDENDUM:  SURGICAL ASSISTANT:  Molli Hazard B. Daphine Deutscher, MD.     Lorne Skeens. Adalene Gulotta, M.D.     Tory Emerald  D:  07/04/2010  T:  07/04/2010  Job:  [id number]  Electronically Signed by Glenna Fellows M.D. on 07/22/2010 03:11:25 PM

## 2010-08-08 ENCOUNTER — Ambulatory Visit: Payer: Medicare Other | Attending: Gynecologic Oncology | Admitting: Gynecologic Oncology

## 2010-08-08 DIAGNOSIS — C44599 Other specified malignant neoplasm of skin of other part of trunk: Secondary | ICD-10-CM | POA: Insufficient documentation

## 2010-08-08 DIAGNOSIS — Z853 Personal history of malignant neoplasm of breast: Secondary | ICD-10-CM | POA: Insufficient documentation

## 2010-08-16 NOTE — Consult Note (Signed)
NAMESIRENA, RIDDLE                ACCOUNT NO.:  [id number]  MEDICAL RECORD NO.:  [id number]           PATIENT TYPE:  O  LOCATION:  PADM                         FACILITY:  [facility]  PHYSICIAN:  Laurette Schimke, MD     DATE OF BIRTH:  Mar 02, 1946  DATE OF CONSULTATION: DATE OF DISCHARGE:                                CONSULTATION   REASON FOR CONSULTATION:  Consult was requested for evaluation of patient with extramammary Paget disease of the vulva by Dr. Estanislado Pandy.  HISTORY OF PRESENT ILLNESS:  This is a 65 year old gravida 1, para 1, last normal menstrual period in 1997.  Ms. Cathers noted vaginal bleeding approximately 8 to 12 months ago and heavier bleeding for the last 2 months.  She was referred to Dr. Estanislado Pandy and an ultrasound was performed on June 20, 2010.  This was notable for endometrial stripe of 9.4 mm. The adnexa appeared within normal limits.  At the time of that evaluation, a biopsy of the vulva was obtained and was consistent with extramammary Paget disease without an invasive component noted within the dermis.  Ms. Sievers subsequently underwent sonohysterogram, which demonstrated the presence of endometrial polyps measuring 2.2 cm.  Ms. Sonnen reports vulva pruritus of several months' duration.  She denies any weight loss, changes in her appetite, fever, chills, nausea, or vomiting.  PAST MEDICAL HISTORY:  Right lumpectomy in February 1997 followed by radical mastectomy at which time she was diagnosed with a stage II right breast cancer with 2 positive lymph nodes involved.  She received adjuvant chemotherapy and then a course of tamoxifen.  A contralateral left-sided breast cancer was identified in April 2006 that was treated with lumpectomy, lymph node dissection, and adjuvant radiation therapy. She is now currently on Femara.  Right breast cancer in 1997, left breast cancer in 2006, hypothyroidism.  PAST SURGICAL HISTORY:  Notable for thyroidectomy in 1983,  tonsillectomy and adenoidectomy in 1968, knee replacement of the right knee in 2007 and in 1996, and left knee replacement in 2005, cholecystectomy on June 21, 2010, left foot fusion in 2010.  PAST GYNECOLOGIC HISTORY:  Gravida 1, para 1, stillbirth at term.  Last Pap 5 years ago.  She reports urinary incontinence.  SOCIAL HISTORY:  Tobacco use, 2 packs per day for 41 years, quit 13 years ago.  Colonoscopy 2 years ago.  FAMILY HISTORY:  Notable for paternal cousin with breast cancer, age unknown.  Her father with lung and kidney cancer and a paternal uncle with lung and colon cancer in 44s.  ALLERGIES:  PENICILLIN, which causes a rash.  MEDICATIONS: 1. Bupropion hydrochloride XL 150 mg daily. 2. Levothyroxine 175 mcg daily. 3. Letrozole 2.5 mg daily. 4. Chlordiazepoxide 1 tablet every 6 h. 5. Citalopram 40 mg 1 tablet daily. 6. Spironolactone 50 mg 1/2 to 1 tablet twice daily. 7. Pantoprazole 40 mg 1 tablet twice daily. 8. Advair p.r.n. 9. Albuterol inhaled nebulizer p.r.n. 10.Clindamycin before dental appointments. 11.Fish oil 1000 mg daily.  REVIEW OF SYSTEMS:  10-point review of systems is notable for vaginal bleeding, vulva pruritus.  No nausea, vomiting.  No abdominal pain. Lower extremity  edema.  Abdominal wall discomfort.  Urinary incontinence.  No hematuria or hematochezia.  Otherwise, 10-point review of systems is negative.  PHYSICAL EXAMINATION:  GENERAL:  Well-developed female in no acute distress. VITAL SIGNS:  Weight 270 pounds, height 5 feet 5-1/2 inches, blood pressure 138/72. CHEST:  Clear to auscultation.  Distant breath sounds. LYMPH NODES:  No cervical, subclavicular, or inguinal adenopathy. HEART:  Regular rate and rhythm. ABDOMEN:  Soft, nontender, obese. PELVIC:  A 7 x 5 cm left labia majora lesion is identified, brawny edematous with changes consistent with extramammary Paget.  There is thickening of the area of the perineum and inner thigh  consistent with trauma from walking. On pelvic examination small cervix is appreciated and is unable to assess the size of the uterus.  IMPRESSION:  Ms. Hawks is a 65 year old with extramammary noninvasive Paget and endometrial polyps with bleeding.  Dr. Estanislado Pandy has requested a joint procedure for the benefit of the patient with a single anesthetic agent.  Our plan is for wide local excision, possible simple vulvectomy of the left labia majora.  Ms. Kasper and her husband were counseled that positive margins are quite common and recurrence will likely be the norm as she has been counseled to pay attention to vulvar itching.  Dr. Estanislado Pandy will perform the hysteroscopy and D and C prior to the wide local excision/simple vulvectomy that will perform by Dr. Katheren Shams- Sharol Given.  Dr. Estanislado Pandy will order antibiotics as indicated for this situation.     Laurette Schimke, MD     WB/MEDQ  D:  07/10/2010  T:  07/11/2010  Job:  [id number]  cc:   [name], R.N. [address] [place], [place] [address]  Silverio Lay, MD  Electronically Signed by Laurette Schimke MD on 07/11/2010 12:52:21 PM

## 2010-08-16 NOTE — Consult Note (Signed)
  NAMEKELSEI, DEFINO                ACCOUNT NO.:  [id number]  MEDICAL RECORD NO.:  [id number]           PATIENT TYPE:  LOCATION:                                 FACILITY:  PHYSICIAN:  Laurette Schimke, MD     DATE OF BIRTH:  [birth year]  DATE OF CONSULTATION:  08/08/2010 DATE OF DISCHARGE:                                CONSULTATION   REFERRING PHYSICIAN:  Crist Fat. Rivard, M.D.  REASON FOR VISIT:  Surveillance, status post partial vulvectomy for extramammary Pagets's.  HISTORY OF PRESENT ILLNESS:  This is a 65 year old who was referred by Dr. Dois Davenport Rivard for Paget disease of the vulva.  Her history is notable for bilateral breast cancers with recurrence, all treated now without any evidence of disease.  On July 16, 2010, she underwent a left partial vulvectomy.  Final pathology was consistent with Paget disease of the vulva with multiple foci of Paget disease present at the medial, inferior margins.  At this visit, Ms. Dray reports fibrinous yellow discharge.  She informs [place] that she is currently receiving Ciprofloxacin.  REVIEW OF SYSTEMS:  No fever, chills, nausea, vomiting, discomfort in the area of the vulva and yellow brown discharge.  PHYSICAL EXAMINATION:  VITAL SIGNS:  Weight 267 pounds, blood pressure 120/62, pulse of 60. ABDOMEN:  Soft, nontender. GENITALIA:  Inspection of the vulva demonstrates that the surgical sites are intact.  There is no evidence of discharge.  No erythema.  No lymphangitic streaking.  ASSESSMENT:  Extramammary Paget disease of the vulva.  PLAN:  Ms. Slagel has been advised to follow up in 6 weeks.  She has been also advised to clean the vulva after defecation and urination and she has been advised on how to use a spray bottle to facilitate multiple daily water debridements of the area.     Laurette Schimke, MD     WB/MEDQ  D:  08/08/2010  T:  08/09/2010  Job:  [id number]  cc:   Dois Davenport A. Rivard, M.D. Fax: [phone]  [name], R.N. [address] [place], [place] [address]  Electronically Signed by Laurette Schimke MD on 08/12/2010 10:24:11 AM

## 2010-08-30 LAB — COMPREHENSIVE METABOLIC PANEL
Albumin: 4.2 g/dL (ref 3.5–5.2)
BUN: 14 mg/dL (ref 6–23)
Chloride: 107 mEq/L (ref 96–112)
Creatinine, Ser: 0.65 mg/dL (ref 0.4–1.2)
GFR calc non Af Amer: >60 mL/min (ref >60)
Total Bilirubin: 0.5 mg/dL (ref 0.3–1.2)

## 2010-08-30 LAB — CBC
HCT: 36.8 % (ref 36.0–46.0)
MCV: 87.1 fL (ref 78.0–100.0)
Platelets: 140 10*3/uL — ABNORMAL LOW (ref 150–400)
WBC: 4.8 10*3/uL (ref 4.0–10.5)

## 2010-08-30 LAB — PROTIME-INR
INR: 1 (ref 0.00–1.49)
Prothrombin Time: 13.3 seconds (ref 11.6–15.2)

## 2010-08-30 LAB — APTT: aPTT: 29 seconds (ref 24–37)

## 2010-09-18 ENCOUNTER — Ambulatory Visit: Payer: Medicare Other | Attending: Gynecology | Admitting: Gynecology

## 2010-09-18 DIAGNOSIS — C44599 Other specified malignant neoplasm of skin of other part of trunk: Secondary | ICD-10-CM | POA: Insufficient documentation

## 2010-09-18 DIAGNOSIS — F17200 Nicotine dependence, unspecified, uncomplicated: Secondary | ICD-10-CM | POA: Insufficient documentation

## 2010-09-18 DIAGNOSIS — Z853 Personal history of malignant neoplasm of breast: Secondary | ICD-10-CM | POA: Insufficient documentation

## 2010-09-18 DIAGNOSIS — N760 Acute vaginitis: Secondary | ICD-10-CM | POA: Insufficient documentation

## 2010-09-18 DIAGNOSIS — Z09 Encounter for follow-up examination after completed treatment for conditions other than malignant neoplasm: Secondary | ICD-10-CM | POA: Insufficient documentation

## 2010-09-19 NOTE — Consult Note (Signed)
NAMECHENEY, GOSCH                ACCOUNT NO.:  [id number]  MEDICAL RECORD NO.:  [id number]          PATIENT TYPE:  LOCATION:                                 FACILITY:  PHYSICIAN:  De Blanch, M.D.DATE OF BIRTH:  [birth year]  DATE OF CONSULTATION:  09/18/2010 DATE OF DISCHARGE:                                CONSULTATION   CHIEF COMPLAINT:  Postoperative followup of Paget's disease of the vulva.  INTERVAL HISTORY:  The patient returns today for followup.  She reports she has had good surgical healing and has had no trouble with her surgical wound.  On the other hand, she has had a considerable amount of vulvitis and has been scratching extensively.  She uses Aristocort cream on occasion.  Patient also complains of abdominal distention and tightness, which has been present for approximately 2 weeks.  She feels gassy and has had some difficulty with constipation.  HISTORY OF PRESENT ILLNESS:  Patient underwent a partial simple vulvectomy on July 16, 2010 for vulvar Paget's disease.  Some of the surgical margins were positive.  She had a postoperative course, which was uncomplicated.  PAST MEDICAL HISTORY:  Bilateral breast cancer, treated with mastectomy and adjuvant chemotherapy and tamoxifen.  PAST SURGICAL HISTORY: 1. Thyroidectomy. 2. Tonsils and adenoidectomy. 3. Total knee replacement of the right knee and left knee. 4. Cholecystectomy. 5. Left foot fusion. 6. Partial vulvectomy.  PAST GYNECOLOGIC HISTORY:  Gravida 1, para 1, stillbirth at term.  She has some urinary incontinence.  SOCIAL HISTORY:  The patient smokes two packs a day.  She quit 13 years ago.  Colonoscopy was performed 2 years ago.  FAMILY HISTORY:  Paternal cousin with breast cancer.  Her father had lung and kidney cancer and a parenteral uncle had lung and colon cancer.  DRUG ALLERGIES:  PENICILLIN (rash).  CURRENT MEDICATIONS:  Listed in my clinic note of  July 10, 2010, are reviewed and unchanged.  REVIEW OF SYSTEMS:  Ten-point comprehensive review of systems negative except as noted above.  PHYSICAL EXAMINATION:  VITAL SIGNS:  Weight 268 pounds, blood pressure 110/70, height 5 feet 5-1/2 inches. GENERAL:  The patient is a pleasant white female, in no acute distress. HEENT:  Negative. NECK:  Supple without thyromegaly or supraclavicular or inguinal adenopathy. ABDOMEN:  Massively obese and distended.  I am unable to detect any fluid wave or shifting dullness.  I am unable to detect any organomegaly or masses. PELVIC EXAMINATION:  EGBUS shows that the left vulva is well-healed from a partial vulvectomy and no new lesions are noted; however, there is a considerable amount of excoriation throughout the entire vulva and medial thighs consistent with a chronic vulvitis.  IMPRESSION:  Paget's disease of the vulva, status post excision with good postoperative healing.  We will return the patient to Dr. Estanislado Pandy for follow-up and would recommend 51-month intervals.  Acute vulvitis:  The patient is given instructions on vulvar hygiene, clean cotton underwear, Aveeno Oatmeal Bath and topical steroids.  She is to return to the care of Dr. Estanislado Pandy to manage this problem.  Abdominal distention of questionable etiology:  We will  refer back to her primary care physician, Dr. Herb Grays to evaluate this problem further.     De Blanch, M.D.     DC/MEDQ  D:  09/18/2010  T:  09/18/2010  Job:  [id number]  cc:   Dois Davenport A. Rivard, M.D. Fax: [phone]  [name], R.N. [address] [place], [place] [address]  Tammy R. Collins Scotland, M.D. Fax: [phone]  Electronically Signed by De Blanch M.D. on 09/19/2010 03:52:38 PM

## 2010-10-08 NOTE — Op Note (Signed)
NAMEJESSIECA, RHEM                ACCOUNT NO.:  [id number]   MEDICAL RECORD NO.:  [id number]          PATIENT TYPE:  AMB   LOCATION:  ENDO                         FACILITY:  [facility]   PHYSICIAN:  Griffith Citron, M.D.DATE OF BIRTH:  Feb 27, 1946   DATE OF PROCEDURE:  05/07/2007  DATE OF DISCHARGE:  05/07/2007                               OPERATIVE REPORT   Date procedure report received May 18, 2007 (after multiple  requests).   [name] with symptoms of dysphagia.  Panendoscopy,  barium swallow with barium tablet both normal.   PROCEDURE:  After reviewing the nature of the procedure with the patient  including potential risks and complications, informed consent was  signed.  Using a low compliance manometry system, nasoesophageal  catheter was placed.  Evaluation of the esophageal, lower esophageal  sphincter, esophageal body, and upper esophageal sphincter were  successfully completed without complication.  The catheter was removed  and the patient was discharged in satisfactory condition.   DATA INTERPRETATION:  The lower esophageal sphincter was located at 43.5  cm to 47.5 cm with a total length of 4 cm.  The lower esophageal  sphincter pressure (LESP) measured 20.2 mmHg.  Normal range 10-45 mmHg.  Relaxation 95% with swallowing was measured.   Lower esophageal body revealed 89% peristaltic contractions.  The  amplitude ranged from 56 to 83 Hg with a mean velocity of 3.5 cm per  second.  All measurements within normal limits.  The upper esophageal  sphincter pressure was a bit increased at 124 mmHg (normal 30-118 mmHg).  There was 96% coordinated relaxation with swallows.   ASSESSMENT:  Normal esophageal manometry, no motility disorder to  explain symptoms of dysphagia.   RECOMMENDATIONS:  Consider a functional etiology.      Griffith Citron, M.D.  Electronically Signed     JRM/MEDQ  D:  05/18/2007  T:  05/19/2007  Job:  [id number]   cc:    Tammy R. Collins Scotland, M.D.  Genene Churn. Cyndie Chime, M.D.

## 2010-10-08 NOTE — H&P (Signed)
[name], [name]                ACCOUNT NO.:  [id number]   MEDICAL RECORD NO.:  [id number]          PATIENT TYPE:  INP   LOCATION:  1519                         FACILITY:  [facility]   PHYSICIAN:  [name], MDDATE OF BIRTH:  March 09, 1946   DATE OF ADMISSION:  07/19/2007  DATE OF DISCHARGE:                              HISTORY & PHYSICAL   CHIEF COMPLAINT:  Shortness of breath.   HISTORY OF PRESENT ILLNESS:  [name] is a 65 year old woman who  presents to the emergency department because of a shortness of breath  particularly with activity.  The patient has been having wheezing,  cough, shortness of breath for 8 days now.  She saw her primary care  physician and has completed a Z-Pak course but she is no better.  She  continues to have a severe cough and shortness of breath with exertion.  She chronically sleeps on 2 pillows but has had to sleep on 3 recently.  She denies any fever.  No chest pain but just persistent severe wheezing  and cough.   PAST MEDICAL HISTORY:  Significant for asthma which was diagnosed 5  years ago but she has a history of breast cancer first in 1997.  She is  status post right mastectomy and chemotherapy. She had a recurrence in  2007 and is status post left lumpectomy and radiation. She has a history  of thyroidectomy, tonsillectomy, bilateral total knee replacements with  multiple complications requiring revisions and she had an infection as  well.  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  The patient also had a history of a left breast implant which was  infected and had to be removed several times, history of GERD,  depression and hypothyroidism. The patient does have a history of  thyroidectomy because she says of an enlarged thyroid and they were  worried about thyroid cancer but that was never diagnosed.   MEDICATIONS ON ARRIVAL:  1.  Spironolactone 25 mg p.o. b.i.d.  2. Bupropion XL 300 mg daily.  3. Citalopram 60 mg daily.  4. Lasix 40 mg daily.  5. Femara 2.5 mg daily.  6. Zegerid 40/1100 at bedtime.  7. Calcium supplementation.  8. Multivitamin.  9. Synthroid 175 mcg daily.   VITAL SIGNS:  Her temperature was 98.8, blood pressure 116/64, pulse 82,  respiratory rate 22, O2 sats 94% on 2 liters nasal cannula.   SOCIAL HISTORY:  She does not smoke cigarettes, drink alcohol or use any  illicit drugs.   FAMILY HISTORY:  Significant for asthma and CHF.   REVIEW OF SYSTEMS:  CONSTITUTIONAL:  Appetite is fine.  No weight loss  reported. HEENT:  She does have a sinus headache, some nasal discharge  which was slightly bloody, so she has had significant sinus trouble over  the last several days. No sore throat. CARDIOVASCULAR:  No chest pain.  No lower extremity edema. RESPIRATORY:  She has a severe cough, it is  nonproductive but persistent. She is short of breath  with walking. She  has had to sleep on 3 pills instead of her usual 2. She has not had any  hemoptysis. GI:  She has chronic constipation.  No abdominal pain, has  not vomited blood or seen blood in her stool.  GU:  Negative.  MUSCULOSKELETAL:  She walks with a cane and that is unchanged.  No  change in her chronic joint pain. NEUROLOGIC:  No asymmetric weakness.  No paresthesias.  All other systems reviewed and are negative.   PHYSICAL EXAMINATION:  The patient is obese and in no acute distress.  HEENT:  Normocephalic, atraumatic.  Pupils are equal round.  Sclerae  nonicteric.  Oral mucosa moist.  NECK:  Supple.  No lymphadenopathy, no thyromegaly, no jugular venous  distention.  CARDIAC:  Regular rate and rhythm.  Her chest wall reveals a deformity  secondary to the right mastectomy.  LUNGS:  Reveal moderate expiratory wheezing in all lung fields.  ABDOMEN:  Obese, nontender, nondistended.  Normoactive bowel sounds.  No  masses are appreciated.   EXTREMITIES:  Reveal no evidence of clubbing, cyanosis or edema.  MUSCULOSKELETAL:  Reveals she has multiple or severe scars over both  knees and some deformity because of the multiple surgeries.  NEUROLOGICALLY:  She is alert and oriented x3.  Her cranial nerves II-  XII are intact grossly.  She has 5/5 strength in her upper and lower  extremities.  Her sensory exam is intact grossly in her upper and lower  extremities except for the fact that she has some mild foot drop in the  left foot which she says is due to some tendons which have ruptured. All  of her labs are still pending at this time. She did have a chest x-ray  which reveals no acute cardiopulmonary process.   ASSESSMENT/PLAN:  1. Chronic obstructive pulmonary disease flare and acute bronchitis.      The patient has failed outpatient management. Will start her on      prednisone which is the one medication  she has not had yet. She      has already completed her antibiotics so we will not repeat any      antibiotics.  She is afebrile and her chest x-ray shows no      pneumonia. I will continue nebs as well and will wait for her      laboratory results.  2. As for her GERD, depression, hypothyroidism, and history of breast      cancer:  Will continue all of her outpatient routine medications.      Vanessa Bottoms, MD  Electronically Signed     CVC/MEDQ  D:  07/19/2007  T:  07/20/2007  Job:  [id number]   cc:   [name], M.D.  Fax: [phone]

## 2010-10-11 NOTE — Op Note (Signed)
[place]  Patient:    LYNN, RECENDIZ [facility] Visit Number: [id number] MRN: [id number]          Service Type: SUR Location: [id number] Attending Physician:  Georgena Spurling Dictated by:   Georgena Spurling, M.D. Proc. Date: 12/10/01 Admit Date:  12/10/2001                             Operative Report  PREOPERATIVE DIAGNOSIS:  Infected left total knee replacement.  POSTOPERATIVE DIAGNOSIS:  Infected left total knee replacement.  PROCEDURE:  Removal of left total knee arthroplasty, and insertion of antibiotic cement spacer, and irrigation and debridement.  ANESTHESIA:  General.  SURGEON:  Georgena Spurling, M.D.  ASSISTANT:  Jamelle Rushing, P.A.  INDICATIONS FOR PROCEDURE:  The patient is status post revision total knee arthroplasty for instability from her early primary arthroplasty.  She had three operations within one month and began having a warm swollen knee. Aspiration results revealed Staph epidermidis infection.  Blood work concurred with that diagnosis and informed consent was obtained.  DESCRIPTION OF PROCEDURE:  The patient was laid supine and administered general endotracheal anesthesia and a Foley catheter placed.  The left lower extremity was prepped in the usual sterile fashion.  The leg was elevated, but not exsanguinated, and the tourniquet inflated to 350 mmHg.  A #10 blade was used to make an incision in the previous midline incision and a clean blade was used to elevate some plaques minimally and identify the arthrotomy.  There was a draining sinus tract coming out the inferomedial portion of the knee and communicating with the skin.  I debrided that aggressively and then made the arthrotomy.  I did a synovectomy very aggressively throughout the knee.  I removed the polyethylene.  I used a small sagittal saw to remove the femoral and tibial component.  Completed that removal with the osteotome and got it out very cleanly with  very, very minimal bone loss.  I then removed all loose cement I could which I believe was just about all of it, and at this point, I irrigated copiously with 6000 cc of normal saline via the pulse lavage system. I then fashioned an antibiotic cement spacer with two batches of Palacos cement and 2.4 g of tobramycin.  I made a tibial component and femoral component, and then put it in the knee when the two components were not together.  I then irrigated once again and left a Hemovac deep to the arthrotomy, closed the arthrotomy with interrupted #1 PDS, and then deep 0 PDSs, and then a running 2-0 PDS, and skin staples.  The patient tolerated the procedure well.  A dressing of Adaptic, 4 x 4s, sterile Webril, and Ace wrap.  Complications none.  Drains one Hemovac. Estimated blood loss 500 cc. Dictated by:   Georgena Spurling, M.D. Attending Physician:  Georgena Spurling DD:  12/10/01 TD:  12/15/01 Job: [id number]

## 2010-10-11 NOTE — Op Note (Signed)
[place]  Patient:    [name]                          MRN: [id number] Proc. Date: 06/03/99 Adm. Date:  [id number] Attending:  Gustavus Messing CC:         Yaakov Guthrie. Shon Hough, M.D.                           Operative Report  PREOPERATIVE DIAGNOSIS:  POSTOPERATIVE DIAGNOSIS:  OPERATION PERFORMED:  Removal of right breast implant, right capsulectomy.  SURGEON:  Yaakov Guthrie. Shon Hough, M.D.  ANESTHESIA:  General.  INDICATIONS FOR PROCEDURE:  The patient is a 65 year old lady who is status post right mastectomy for breast cancer and had reconstruction done and had implant replacement because of capsule formation.  Has had increased pain and difficulty with the right breast ____________ allow me to expand this as well over the last year or so.  She has now developed a Bakers 3 to 4 capsule formation, increased  pain that radiates from the breast area to the axillary and back areas with no improvement.  DESCRIPTION OF PROCEDURE:  The patient underwent general anesthesia, intubated orally.  Prep was done to the chest and breast areas in the routine fashion using Betadine soap and solution and walled off with sterile towels and drapes so as o make a sterile field.  The right transverse incision was entered down through skin and subcutaneous tissue to the underlying capsule.  Opening was made in the capsule and the implant was removed.  Next, capsulectomies were done throughout the area to remove intense scar tissue.  After this and after proper hemostasis using a Bovie unit on coagulation, the wound was irrigated with saline.  The wounds were reclosed with 2-0 Monocryl x 2 layers and a running subcuticular stitch of 3-0 Monocryl.  Steri-Strips and soft dressing were applied to all the areas.  She withstood the procedures very well and was taken to recovery in excellent condition. DD:  06/03/99 TD:  06/03/99 Job:  [id number]

## 2010-10-11 NOTE — H&P (Signed)
[place]  Patient:    [name], [name]                         MRN: [id number] Adm. Date:  10/30/00 Attending:  Darden Palmer., M.D. CC:         Tammy R. Collins Scotland, M.D.             John L. Dorothyann Gibbs, M.D.                         History and Physical  REASON FOR ADMISSION:  For catheterization.  HISTORY:  The patient is a 65 year old woman with lifelong obesity and severe arthritis.  She has had a prior history of right mastectomy for breast cancer and postoperative chemotherapy and has a longstanding history of cigarette abuse, quitting last October.  Last October she had a right total knee replacement with Dr. Priscille Kluver and states she had a bad time with the surgery with dyspnea, had a difficult time getting back to par in rehab.  Surgery was planned on the left leg but surgery was cancelled because of a history of progressive dyspnea with chest tightness and pain radiating up into her neck and shoulder.  She was placed on Prevacid with some relief and noticed that the pain had some relation to food.  She has become limited with dyspnea and exertional sweating and we saw her in consultation on Oct 15, 2000.  An adenosine Cardiolite scan done on a two-day basis was done on Oct 23, 2000 which was an indeterminate study.  Because she has had progressive symptoms catheterization was advised to exclude significant coronary artery disease prior to planned upcoming knee surgery.  PAST HISTORY:  Remarkable for longstanding cigarette abuse.  She states that she has quit since last October.  There is a history of cancer.  There is no history of hypertension or diabetes.  PREVIOUS SURGERY:  Right knee replacement, tonsillectomy, D&C, right mastectomy for cancer, thyroidectomy, arthroscopy bilaterally in knees, right trigger thumb, and lumbar laminectomy.  ALLERGIES:  DEMEROL, CODEINE, and PENICILLIN.  CURRENT MEDICATIONS: 1. Sorbitol b.i.d. 2.  Synthroid 0.2 mg daily. 3. Vioxx 25 b.i.d. 4. Nolvadex 20 mg daily. 5. Prevacid 30 mg daily. 6. Wellbutrin SR 150 b.i.d. 7. Celexa 40 mg daily. 8. Ranitidine 150 mg p.r.n.  FAMILY HISTORY:  Father died age 53 of heart attack, lung cancer, kidney problems.  Mother is 50.  Three brothers and two sisters healthy.  SOCIAL HISTORY:  She has been married for several years.  She is a disabled Midwife, quit smoking six months ago.  Does not drink alcohol to excess.  Does drink some caffeine.  REVIEW OF SYSTEMS:  Weight has gone up since her previous knee surgery to a high weight of 280 pounds.  She has been quite inactive.  She denies eye problems or skin problems, does wear glasses.  She has some esophageal reflux as well as indigestion and does have constipation.  She does have urinary frequency and urgency and some dependent edema.  No claudication or TA. Significant low back pain and severe generalized arthritis.  The remainder of the review of systems is unremarkable except as noted above.  PHYSICAL EXAMINATION:  GENERAL:  She is an obese female in no acute distress.  VITAL SIGNS:  Blood pressure 120/70 sitting and standing, pulse 76.  SKIN:  Warm and dry.  HEENT:  EOMI, PERRLA.  C&S clear.  Fundi unremarkable.  Pharynx negative.  NECK:  Supple without masses, JVD, thyromegaly, or bruits.  LUNGS:  Clear to A&P.  Increased AP diameter.  BREASTS:  Not examined.  Right mastectomy noted.  CARDIAC:  Normal S1, S2, no S3 or murmur.  ABDOMEN:  Soft and nontender.  No mass and no organomegaly.  PULSES:  Femoral and distal pulses are deep.  EXTREMITIES:  There is trace edema noted.  LABORATORY DATA:  A 12-lead electrocardiogram shows sinus rhythm and is normal.  IMPRESSION: 1. Exertional dyspnea and atypical chest pain with an indeterminate    Cardiolite scan, rule out significant coronary artery disease. 2. Severe arthritis in need of knee replacement. 3. Morbid  obesity. 4. Chronic obstructive pulmonary disease with previous cigarette smoking. 5. Anxiety and depression.  RECOMMENDATIONS:  Brought in at this time for same-day cardiac catheterization.  Procedure is discussed with the patient fully including risk of MI, death, or CVA and she is willing to proceed. DD:  10/29/00 TD:  10/29/00 Job: [id number]

## 2010-10-11 NOTE — Discharge Summary (Signed)
NAMEANGENETTE, [name]                ACCOUNT NO.:  [id number]   MEDICAL RECORD NO.:  [id number]          PATIENT TYPE:  OBV   LOCATION:  1519                         FACILITY:  [facility]   PHYSICIAN:  Michaelyn Barter, M.D. DATE OF BIRTH:  1946/05/03   DATE OF ADMISSION:  07/19/2007  DATE OF DISCHARGE:  07/20/2007                               DISCHARGE SUMMARY   FINAL DIAGNOSES:  1. Acute chronic obstructive pulmonary disease exacerbation.  2. Acute bronchitis.   PROCEDURES:  Chest x-ray completed July 19, 2007.   HISTORY OF PRESENT ILLNESS:  [name] is a 65 year old female who  arrived with a chief complaint of shortness of breath.  She indicated  that she had experienced wheezing, cough, shortness of breath for eight  days.  She saw her primary care physician and was prescribed a Z-Pak,  but her symptoms did not improve.  She indicated that her shortness of  breath was more pronounced with exertion.   PAST MEDICAL HISTORY:  Please see that dictated by Dr. Buena Irish.   HOSPITAL COURSE:  Acute chronic obstructive pulmonary disease  exacerbation.  A chest x-ray was done on February 23 which revealed no  active disease.  The patient was admitted to the medicine floor, and  prednisone as well as well as breathing treatments were provided to the  patient.  Over the patient's hospital course her breathing improved  significantly.  Her shortness of breath had resolved, and by the  following day the patient indicated that she wanted to go home.  She was  also started on empiric antibiotics.  On the day of discharge the  patient's vitals are temperature 97.4, heart rate 74, respirations 20,  blood pressure 114/78, O2 sat 93% on 2 liters.   DISCHARGE MEDICATIONS:  She was discharged home on:   1. Combivent MDI 2 puffs q.6 h.  2. Moxifloxacin 400 mg p.o. [name].  3. Prednisone tapering.   She was told to call her primary care doctor for follow-up appointment.      Michaelyn Barter, M.D.  Electronically Signed     OR/MEDQ  D:  08/26/2007  T:  08/26/2007  Job:  [id number]

## 2010-10-11 NOTE — Discharge Summary (Signed)
[name], [name]                          ACCOUNT NO.:  [id number]   MEDICAL RECORD NO.:  [id number]                   PATIENT TYPE:  INP   LOCATION:  5031                                 FACILITY:  [facility]   PHYSICIAN:  Mila Homer. Sherlean Foot, M.D.              DATE OF BIRTH:  [birth year]   DATE OF ADMISSION:  12/10/2001  DATE OF DISCHARGE:  12/18/2001                                 DISCHARGE SUMMARY   ADMISSION DIAGNOSES:  1. Infected left total knee arthroplasty.  2. Right total knee arthroplasty.  3. Obesity.  4. Thyroid disease.  5. Peptic ulcer disease.  6. History of depression.  7. History of angina.   DISCHARGE DIAGNOSES:  1. Irrigation and debridement with removal of left total knee arthroplasty     hardware with implantation of antibiotic-impregnated cement spacer.  2. Methicillin-sensitive Staphylococcus aureus infection.  3. Repeated dislocation of left knee with cement spacer.  4. Depression.  5. Postoperative blood loss anemia.  6. History of peptic ulcer disease.  7. History of angina.  8. Obesity.  9. Thyroid disease.   HISTORY OF PRESENT ILLNESS:  The patient is a 65 year old white female with  a history of left total knee arthroplasty in November 2002.  The patient  initially had problems with multiple spinouts of the poly prior to being  discharged.  A revision was performed x2 with a second revision including  the removal of hardware and a restraining-type prosthesis placed.  The  patient has been having chronic low-grade temperatures, pain over the  anterior-inferior part of the joint, last aspiration performed showed Staph  species coagulase negative.  It was felt that the prosthesis joint would  involve an infection and so she was brought in to the hospital for removal  of hardware and placement of antibiotic-impregnated cement spacer for a  total of 6-8 weeks IV antibiotics and then revision with a new prosthesis.   ALLERGIES:  DEMEROL, CODEINE,  PENICILLIN, SENSITIVE TO TAPE.   CURRENT MEDICATIONS:  1. Synthroid 175 mcg p.o. q.d.  2. Nexium 40 mg p.o. q.d.  3. Wellbutrin 200 mg p.o. b.i.d.  4. Celexa 40 mg p.o. q.d.   SURGICAL PROCEDURE:  On December 10, 2001, the patient was taken to the OR by  Dr. Georgena Spurling assisted by Jamelle Rushing, P.A.-C.  Under general  anesthesia, the patient underwent a removal left total knee arthroplasty  with insertion of an antibiotic cement spacer with irrigation and  debridement.  The patient tolerated the procedure well.  One Hemovac drain  was left in place.  Complications were none.  Estimated blood loss 500 cc.  The patient was transferred to the recovery room and then to the orthopedic  floor after receiving a femoral nerve block for routine postop care.   CONSULTATIONS:  A physical therapy consult, infectious disease consult, and  a psych consult were requested for evaluation of the  patient's multiple  medical issues during her hospitalization.   HOSPITAL COURSE:  On December 10, 2001, the patient was admitted to [facility] under the care of Dr. Georgena Spurling.  The patient was taken to the  OR for an I&D and removal of left total knee prosthesis due to a Staph  species coagulase-negative infection.  An antibiotic spacer was placed.  The  patient had a postoperative femoral nerve block placed and was transferred  to the recovery room and then to the orthopedic floor in good condition.  The patient was placed on Lovenox for routine DVT prophylaxis.   The patient then incurred a total of 8 days postoperative care on the  orthopedic floor.  She did develop some postoperative blood loss anemia when  her hemoglobin dropped to 8.7.  She was transfused 2 units of packed red  blood cells with improvement of her hemoglobin to 9.7.  Infectious disease  did evaluate the patient and the cultures and felt that the appropriate IV  antibiotic to start with would be vancomycin.  This was dosed for  IV dosing  for q.d. dosing and the patient was continued on this even as a post-  outpatient basis.   The patient's other significant events during her hospitalization were she  had multiple posterior dislocations of the tibial cement component, the  patient was noncompliant with wearing her leg immobilizer, she had to have  two closed reductions at the bedside with the first attempting to keep the  leg reduced with just a long leg immobilizer, the second attempt was finally  placing a long-leg cast.  The patient was evaluated after each reduction  with improvement with a perching of the tibial component onto the femoral  component but this was felt to be better than completely dislocated as  previously been.   The patient's wound remained benign for any signs of infection.  Her vital  signs remained stable.  Her leg remained neuromotor vascularly intact during  her hospitalization.  The patient did have significant issues raised with  her depression and frustration with her current medical situation, her home  situation and loss of support by a close family member so it was requested a  psychiatry consult for evaluation of this and possible further  recommendations for management.  This consult was performed.  Her  medications were adjusted in the dosage and she was recommended for followup  with the [facility]patient [facility] upon discharge.   On postop day #8 the patient's leg was neuromotor vascularly intact in a  long leg cast, the patient was comfortable, there were no other complaints  or issues and she was felt to be ready for discharge to home after having a  PICC line placed and outpatient IV vancomycin.  These were arranged and the  patient was discharged to home in improved condition.   LABORATORIES:  1. EKG on admission was 83 beats per minute with normal sinus rhythm. 2. CBC on December 18, 2001 - WBC 3.7, hemoglobin 8.9, hematocrit 26.8,     platelets 200.  3.  Routine chemistries on December 12, 2001 - Sodium of 141, potassium of 3.8,     glucose 111, BUN 7, creatinine 0.6.  4. Wound cultures from December 10, 2001 show aerobic culture of rare Staph     species coagulase negative.  No anaerobes noted.   TRANSFUSION:  The patient received a total of 2 units of packed red blood  cells during hospitalization.  MEDICATIONS UPON DISCHARGE:  1. Colace 100 mg p.o. b.i.d.  2. Trinsicon one capsule p.o. t.i.d.  3. Percocet one or two tablets every 4-6 hours p.r.n.  4. Tylenol 650 mg po q.4h. p.r.n.  5. Skelaxin one or two tablets every 6 hours p.r.n.  6. Restoril 30 mg p.o. q.h.s. p.r.n.  7. Lovenox 30 mg subcu q.12h.  8. Synthroid 125 mcg p.o. q.d.  9. Protonix 80 mg p.o. q.d.  10.      Wellbutrin 200 mg p.o. q.d.  11.      Celexa 40 mg p.o. q.d.  12.      Vancomycin 1500 IV q.24h.  13.      OxyContin CR 10 mg p.o. q.12h.   DISCHARGE INSTRUCTIONS:  1. Medications:     a. Resume home meds except Celexa.     b. Celexa 60 mg once a day.     c. Lovenox 40 mg one injection a day for 7 days.     d. OxyContin CR 10 mg one tablet every 12 hours.     e. Dilaudid 2 mg one or two tablets every 4-6 hours for pain if needed.     f. Vancomycin IV once a day 2 g IV over 2 hours.  2. Activity:     a. Rest as much as possible.     b. Keep brace on at all times.  3. Wound care: Keep wound clean.  If there are any signs of infection call     Dr. Tobin Chad office.  4. Followup:     a. Call Dr. Tobin Chad office for an appointment in 1 week.     b. Call [phone] for a followup appointment with Behavioral Health.   CONDITION ON DISCHARGE TO HOME:  Improved and good.     Jamelle Rushing, P.A.                      Mila Homer. Sherlean Foot, M.D.    RWK/MEDQ  D:  02/09/2002  T:  02/11/2002  Job:  [id number]

## 2010-10-11 NOTE — Op Note (Signed)
[facility]  Patient:    TALULLAH, ABATE [facility] Visit Number: [id number] MRN: [id number]          Service Type: SUR Location: 4W [id number] [id number] Attending Physician:  Carolan Shiver Ii Proc. Date: 03/29/01 Admit Date:  03/29/2001                             Operative Report  INDICATION AND JUSTIFICATION FOR PROCEDURE:  Chronic unremitting left knee pain, nonresponsive to conservative measures with radiographic bone against bone lateral compartment.  JUSTIFICATION FOR HOSPITAL STAY:  Pain control and rehab.  PREOPERATIVE DIAGNOSIS:  End-stage osteoarthritis left knee.  SURGICAL PROCEDURE:  Left LCS total knee replacement, fully cemented.  POSTOPERATIVE DIAGNOSIS:  End-stage osteoarthritis left knee.  SURGEON:  John L. Dorothyann Gibbs, M.D.  ASSISTANTJerolyn Shin. Tresa Res, M.D.  ANESTHESIA:  General.  PATHOLOGY:  The patient has bone against bone lateral compartment of the knee plus end-stage patellofemoral arthritis as well and milder medial compartment arthritis.  DESCRIPTION OF PROCEDURE:  Under general anesthesia, the left leg was prepared with DuraPrep and draped as a sterile field.  Midline incision was made.  The patella was everted.  Femur was sized as a standard.  Proximal tibial resection was carried out using the tibial guide.  Using first the femoral guide, an intercondylar drill hole was placed.  Using the second guide, the anterior and posterior flare of the distal femur were resected with a 15 mm flexion gap.  Using the intramedullary guide, distal femoral cut was made with a 15 mm extension gap on the second try.  It was 12.5 at the first and not long enough.  Recessing guide was then used.  Lamina spreader was then inserted.  Remnants of the menisci and cruciates were resected.  Bone spurs off the back of the femoral condyle and the fabella were excised.  Following this, tibia was sized at 2.5.  A center peg hole was made along with hole  for a keel.  Following this, trial reduction of a keeled 2.5 tibial component, a 15 mm bearing, and a standard femur revealed excellent fit, alignment, and stability both in flexion and extension.  The patella was then osteotomized, three peg holes placed; trial patella insertion revealed excellent fit and stability.  Permanent components were then obtained, and all were cemented in place.  Once cement hardened, excess was removed.  The leg was then tested through a full range of motion, and the tourniquet was let down.  At this point, a lateral bearing was found to spin forward and out of place.  The determination was made that a larger bearing was required.  Bearings were trialed, and the 20 bearing finally gave good stability in flexion.  It was stability in flexion that was the problem not extension.  A permanent 20 bearing was then inserted and was stable both in flexion and extension.  Full extension was obtained.  The wound was then closed in layers with #1 Surgidac, 2-0 Vicryl, and skin clips.  Operative time approximately 1 hour and 20 minutes.  The patient tolerated the procedure well.  Blood loss was 150 cc. Medium Hemovac drain was in the knee. Attending Physician:  Carolan Shiver Ii DD:  03/29/01 TD:  03/30/01 Job: [id number]

## 2010-10-11 NOTE — Discharge Summary (Signed)
[facility]  Patient:    [name], [name]                     MRN: [id number] Adm. Date:  [id number] Disc. Date: 05/07/00 Attending:  Herold Harms Dictator:   Arnoldo Morale, P.A. CC:         Tammy R. Collins [place], M.D., [facility]   Discharge Summary  ADMISSION DIAGNOSES  1. End-stage osteoarthritis, bilateral knees, right worse than left.  2. Obesity.  3. Hypothyroidism.  4. Peptic ulcer disease.  5. Gastroesophageal reflux disease.  6. History of depression.  7. Chronic bronchitis.  8. History of breast cancer.  9. Candidiasis in groin. 10. Chronic constipation.  DISCHARGE DIAGNOSES  1. Post-hemorrhagic anemia.  2. Postoperative atelectasis, left lower lobe.  3. End-stage osteoarthritis, bilateral knees, right worse than left.  4. Obesity.  5. Hypothyroidism.  6. Peptic ulcer disease.  7. Gastroesophageal reflux disease.  8. History of depression.  9. Chronic bronchitis. 10. History of breast cancer. 11. Candidiasis in groin. 12. Chronic constipation.  SURGICAL PROCEDURE:  On May 04, 2000, Ms. Backs underwent a right total knee arthroplasty by Dr. Jonny Ruiz L. Rendall III.  COMPLICATIONS:  None.  CONSULTANTS  1. Pharmacy consult for Coumadin therapy, May 04, 2000.  2. Case Management, Rehab Medicine and physical therapy consults,     May 05, 2000.  3. Occupational therapy consult, May 06, 2000.  HISTORY OF PRESENT ILLNESS:  This 65 year old white female presented to Dr. Priscille Kluver with a 10- to 20-year history of progressively worsening bilateral knee pain.  The knee pain has been so severe that she had to be medically disabled.  Over the last two to three years, the pain in the right knee has been getting progressively worse and now it is much worse than the left.  The pain is constant in both knees but the pain in the right knee is located in the anteromedial aspect of the knee with occasional radiation  down her leg. She has severe leg cramps when the pain is severe.  The pain is constant and increased with any walking and standing and decreased with elevation and ice. Because of her significant difficulty walking because of the pain and the failure of conservative measures, she is presenting for a right total knee arthroplasty.  HOSPITAL COURSE:  She tolerated her surgical procedure well without immediate postoperative complications.  She was subsequently transferred to 4-west. Postop day #1, T-max was 100.3 and vital signs stable.  She had a productive cough with slightly blood-tinged sputum.  There were some coarse breath sounds noted in the right lung and x-rays were taken; they showed a possible infiltrate in the left lower lobe.  Hemoglobin was 10.7, hematocrit 30.7.  Her leg was neurovascularly intact.  The incision was well-approximated with staples.  She was started on PT per protocol and continued on Coumadin per protocol.  On postop day #2, T-max was 100.4.  Vital signs stable.  A repeat PA and lateral chest x-ray were obtained per radiologys recommendations and it did confirm atelectasis in the left lower lobe.  Breath sounds did appear slightly clear at this time.  Right leg was unchanged.  Hemoglobin was 9.5, hematocrit 27.3.  She did complain of some constipation and medications were given to help resolve that.  She was converted to p.o. pain medicine.  On December 13th, she was feeling slightly better.  She did have a productive cough and cough syrup was given  to help suppress that some.  The medication worked the day before and her constipation had resolved.  T-max was 100.2. Lung sounds had improved at that time but still a few rales and expiratory wheezes were noted.  Right knee incision was unchanged.  Her hemoglobin was 8.5, hematocrit 24.3; this was monitored and she was started on ferrous sulfate.  She was also started on Tequin 400 mg p.o. q.d. for five days  to cover for a possible respiratory infection.  Sputum culture was ordered.  A bed became available on rehab and she was subsequently transferred to rehab.  DISCHARGE INSTRUCTIONS  1. She is to continue all her current hospital medications, with them to be     adjusted by the rehab medicine doctors as needed.  2. She is to be weightbearing as tolerated on the right leg with the use of a     walker and is to continue PT and OT per rehab protocols.  3. Monitor the right knee incision for signs of infection and on postop day     #14, her staples can be removed with Steri-Strips applied.  If she is     still hospitalized at that time, it can be done in house; otherwise, she     needs to follow up with Dr. Priscille Kluver at about that time for staple removal.     If staples are removed prior to her discharge, then she needs to follow     up with Dr. Priscille Kluver in approximately two weeks; she needs to call [phone]     to set up that appointment.  4. You are to notify Dr. Priscille Kluver of any temperature greater than 101.5,     chills, pain unrelieved by pain medications or foul-smelling drainage from     the wounds.  LABORATORY AND X-RAY FINDINGS:  On April 28, 2000, hemoglobin was 12.1, hematocrit 35.1.  On December 11th, hemoglobin 10.7, hematocrit 30.7, platelets 133,000.  On December 12th, white count was 8.8, hemoglobin 9.5, hematocrit 27.3, platelets 116,000 and on December 13th, white count was 6.7, hemoglobin 8.5, hematocrit 24.3 and platelets 105,000.  Her PT on December 4th was 13.7 seconds, INR 1.1 and PTT 30.  On December 13th, PT was 20.1 seconds, INR 2.2.  On December 4th, her sodium was 134, potassium 4.  On December 11th, sodium was 135, potassium 4.4, glucose 156, calcium 8.2.  On December 12th, glucose was 153, calcium 8.2.  On December 13th, sodium was 138, potassium 3.8, chloride 104, CO2 30, BUN 7, creatinine 0.8 and glucose 166.  All other laboratory studies were within normal  limits.  Chest x-ray done on April 28, 2000 showed probable COPD, chronic bronchitis,  bibasilar atelectasis related to suboptimal inspiration but no evidence of acute disease.  Portable chest x-ray done on December 11th showed lingular atelectasis versus infiltrate and a followup PA and lateral chest was recommended.  A repeat chest x-ray done, PA and lateral, on December 12th showed linear opacity at the left base, most compatible with atelectasis. DD:  05/08/00 TD:  05/08/00 Job: [id number]

## 2010-10-11 NOTE — H&P (Signed)
[name], [name]                ACCOUNT NO.:  [id number]   MEDICAL RECORD NO.:  [id number]          PATIENT TYPE:  INP   LOCATION:  NA                           FACILITY:  [facility]   PHYSICIAN:  Mila Homer. Sherlean Foot, M.D. DATE OF BIRTH:  August 20, 1945   DATE OF ADMISSION:  07/07/2005  DATE OF DISCHARGE:                                HISTORY & PHYSICAL   CHIEF COMPLAINT:  Painful right total knee for the last 4-6 months.   HISTORY OF PRESENT ILLNESS:  This 65 year old white female patient with a  history of bilateral knee replacements and multiple revisions surgeries on  the last presents with an approximately 5-6 months history of gradual onset  of progressively worsening right knee pain. She has had no new injury to the  right knee and no surgery since the knee was placed. At this point, the pain  is a constant sharp, throbbing sensation over the medial and anterior aspect  of the knee with radiation all the way down into her foot at times. Pain  increases with any prolonged standing or walking and then decreases with  elevation and ibuprofen. She complains of the knee catching, locking,  swelling and keeping her up at night. She has great difficulty the first  thing in the morning when she attempts to walk. She actually has to use a  walker. She has had several falls because her knee gave way. The knee does  not grind or pop. She has been walking with a cane and walker on and off the  last year.   ALLERGIES:  1.  DEMEROL causes sedation.  2.  CODEINE causes hallucinations.  3.  PENICILLIN causes severe rash.  4.  PLASTIC TAPE causes a rash.  5.  VALIUM causes sedation.   CURRENT MEDICATIONS:  1.  Calcium plus D 600 mg 2 tablets p.o. q.a.m..  2.  Multivitamin 1 tablet p.o. q.a.m.Marland Kitchen  3.  Samara 2.5 mg 1 tablet p.o. q.a.m.Marland Kitchen  4.  Nexium 40 mg 1 tablet p.o. q.a.m..  5.  Bupropion XL 300 mg 1 tablet p.o. q.a.m..  6.  Levothyroxine 175 mcg 1 tablet p.o. q.a.m.Marland Kitchen  7.  Citalopram 40 mg 1/2  to 1 tablet p.o. q.a.m..  8.  Lasix 40 mg 1 tablet p.o. q.a.m. p.r.n..  9.  Detrol LA 4 mg 1 tablet p.o. q.a.m.Marland Kitchen  10. Advair discus 250/50 mg 1 puff inhaled twice daily p.r.n.Marland Kitchen  11. Albuterol nebulizer use p.r.n.Marland Kitchen  12. Senokot 2 tablets p.o. q.a.m. p.r.n.Marland Kitchen   PAST MEDICAL HISTORY:  1.  Thyroid disease, initially hyperactive, treated with radioactive iodine      and subsequent thyroidectomy.  2.  Peptic ulcer disease, 1992.  3.  Depression since the 1980s.  4.  Chronic bronchitis and newly diagnosed with asthma and 2000.  5.  Gastroesophageal reflux disease.  6.  History of breast cancer with right mastectomy and reoccurrence in the      left breast in 2005.  7.  History of angina with cardiac catheterization in June 2002 by Dr.      Donnie Aho.   She  denies any history of diabetes mellitus, hypertension, hiatal hernia,  heart disease, or any other chronic medical condition other than noted  previously.   PAST SURGICAL HISTORY:  1.  Left total knee arthroplasty with 2 revisions by Dr. Priscille Kluver.  2.  Tonsillectomy.  3.  Fair thyroidectomy in 1983.  4.  Right breast lumpectomy.  5.  Right breast mastectomy with lymph node dissection.  6.  Repair of herniated lumbar disk L5-S1.  7.  Left knee arthroscopy by Dr. Priscille Kluver.  8.  Right knee arthroscopy and right trigger finger release of the thumb by      Dr. Priscille Kluver.  9.  Left chest Port-A-Cath with removal in the same year and placement of      Hickman catheter.  10. Right total knee arthroplasty in about 1999 by Dr. Jonny Ruiz L. Rendall.  11. Removal of infected left total knee December 10, 2001, with placement of      antibiotic spacer.  12. Closed reduction of antibiotic spacer January 12, 2002.  13. Revision left total knee with removal of antibiotic spacer January 14, 2002, by Dr. Mila Homer. Lucey.  14. Left breast lumpectomy November 2005.  15. Basal cell carcinoma removal left thigh 2005.  16. Removal of left thigh anterior basal cell  carcinoma January 2007.  17. D&C in 1976.  18. Right breast implant 1998 and subsequent removal after Port-A-Cath was      removed.   She denies any complications from the above-mentioned surgical procedures.   SOCIAL HISTORY:  She quit cigarette smoking in 2001. She does not drink nor  use any drugs. She is married. Her husband has been laid off. They live in a  one-story house with four steps into the main entrance. Her medical doctor  is Dr. Herb Grays at [phone]. Her cardiologist is Dr. Viann Fish and  Dr. Deborah Chalk.   FAMILY HISTORY:  Mother is alive at age 46 with Alzheimer's. Father died at  the age of 41 with lung cancer kidney cancer and myocardial infarction. She  has three living brothers with history of peptic ulcer disease and two  sisters who are alive and well. She has no living children. She did have a  stillborn daughter in 48.   REVIEW OF SYSTEMS:  She does have a history of headaches. She does wear  glasses. She has some dyspnea on exertion. She does have constipation  treated with Senokot. Complains of occasional low abdominal pain. She does  have some urinary urgency or frequency treated with Detrol. She does not  have a living will nor power of attorney. All other systems were negative  and noncontributory.   PHYSICAL EXAMINATION:  GENERAL:  Well-developed, well-nourished overweight  white female who walks with a significant antalgic gait and right-sided limp  with use of a cane. Mood and affect are appropriate. Accompanied by her  husband.  Height 5 feet 5 inches, weight 264 pounds, BMI is 33.  VITAL SIGNS: Temperature 99.2 degrees Fahrenheit, pulse 76, respirations 14,  and blood pressure 134/76.  HEENT: Normocephalic, atraumatic without frontal or maxillary sinus  tenderness to palpation. Conjunctiva pink. Sclerae anicteric. PERLA. EOMs intact. No visible external ear deformities. Right ear canal occluded with  cerumen. Left tympanic membranes pearly  gray with good light reflex. Nose  and nasal septum midline. Nasal mucosa pink and moist without exudates or  polyps noted. Buccal mucosa pink and moist. Dentition in fair repair.  Pharynx without erythema or exudates.  Tongue and uvula midline. Tongue  without fasciculations, and uvula rises equally with phonation.  NECK: No visible masses or lesions noted. There is a well-healed low neck  incision line. No palpable lymphadenopathy or thyromegaly. Carotids +2  bilaterally without bruits. Full range of motion, nontender to palpation  along the cervical spine.  CARDIOVASCULAR:  Heart rate and rhythm regular. S1-S2 present without rubs,  clicks or murmurs noted.  RESPIRATORY: Respirations even and unlabored. Breath sounds clear to  auscultation bilaterally without rales or wheezes noted. There is a  midsternal old healed incision line from a previous Hickman catheter.  BREASTS: She does have a right mastectomy scar noted that is well healed and  approximated. There is a low inner quadrant left breast incision line that  is also well healed and approximated. No other abnormalities noted.  ABDOMEN:  Rounded abdominal contour. Bowel sounds present x4 quadrants. She  does have pain with palpation in the right lower quadrant of the abdomen.  This is reproducible. No rebound. Femoral pulses +2 bilaterally. Nontender  to palpation along the vertebral column.  GU/RECTAL/PELVIC: These exams deferred at this time.  MUSCULOSKELETAL:  No obvious deformities bilateral upper extremities with  full range of motion of these extremities without pain. Radial pulses +2  bilaterally. She has full range of motion of her hips, ankles and toes  bilaterally. DP and PT pulses are +2. There is +2 bilateral, mildly pitting  lower extremity edema. No calf pain with palpation. Negative Homans' sign  bilaterally.  LEFT KNEE has a well-healed curvilinear knee incision. She appears to have  some bruising along the lateral  aspect of the knee. She is lacking about 5  degrees of full extension and can flex the knee to 100 degrees with minimal  amount of patellofemoral crepitus. She is tender to palpation over the  medial joint line, none laterally. Stable to varus and valgus stress.  Negative anterior drawer. Right knee has a well-healed midline incision. No  erythema or ecchymosis. She has full extension and flexion to 90 degrees  with a fair amount of pain. She is acutely tender to palpation over the  medial joint line, none laterally. There may be a small effusion. Stable to  varus and valgus stress. Negative anterior drawer.  NEUROLOGIC: Alert and oriented x3. Cranial nerves II-XII are grossly intact.  Strength 5/5 bilateral upper and lower extremities. Rapid alternating  movements intact. Deep tendon reflexes 2+ bilateral upper and lower  extremities. Sensation intact to light touch.  RADIOLOGIC FINDINGS:  X-rays taken of the right knee in April 2005 show some  signs of settling of the tibial prosthesis with it tilting into varus and  what Dr. Sherlean Foot felt were signs of loosening.   IMPRESSION:  1.  Painful right total knee replacement.  2.  History of multiple revision surgeries on the left knee status post most      recent revision in 2003.  3.  Obesity.  4.  Hypothyroidism.  5.  Peptic ulcer disease.  6.  Gastroesophageal reflux disease.  7.  Depression.  8.  Chronic bronchitis and asthma.  9.  History of breast cancer with recent reoccurrence in the left breast in      2005.  10. History of angina.   PLAN:  Ms. Cohick will be admitted to [facility] on July 07, 2005, where she will undergo a revision of her right total knee arthroplasty  by Dr. Mila Homer. Lucey. She will undergo all the  routine preoperative  laboratory tests and studies prior to this procedure. If she has any medical  issues while she is hospitalized, we will consult Dr. Alda Berthold group and find  out who they would  like to have follow her or consult Dr. Deborah Chalk or Dr.  Donnie Aho.      Legrand Pitts Duffy, P.A.    ______________________________  Mila Homer. Sherlean Foot, M.D.    KED/MEDQ  D:  06/30/2005  T:  06/30/2005  Job:  [id number]

## 2010-10-11 NOTE — Op Note (Signed)
   NAMEMENDE, BISWELL                          ACCOUNT NO.:  [id number]   MEDICAL RECORD NO.:  [id number]                   PATIENT TYPE:  INP   LOCATION:  NA                                   FACILITY:  [facility]   PHYSICIAN:  Mila Homer. Sherlean Foot, M.D.              DATE OF BIRTH:  Apr 29, 1946   DATE OF PROCEDURE:  12/30/2001  DATE OF DISCHARGE:                                 OPERATIVE REPORT   SURGEON:  Mila Homer. Sherlean Foot, M.D.   ASSISTANT:  None.   ANESTHESIA:  General mask.   INDICATIONS FOR PROCEDURE:  The patient is a 64 year old status post  infectious of a total knee revision arthroplasty with removal and antibiotic  cement spacers with dislocation.  She dislocated the spacer and I took her  to the operating room a week ago, did a closed reduction under anesthesia,  placed a cast in flexion, and she returned to the office yesterday with it  dislocated once again.  Informed consent was obtained.   DESCRIPTION OF PROCEDURE:  I removed the case after she was placed under  general anesthesia.  I performed several reduction maneuvers.  It was  obviously being caught in the tight lateral compartment and opening/levering  posteriorly in the loose medial compartment.  Therefore, I found the most  stable position was to be in hyperextension and with her in hyperextension  and a varus stress straightening the leg, it was stable.  So, I cast it in  that position, verified under C-arm images that it was in fact located and  the patient was awakened and left the operating room in stable condition.                                               Mila Homer. Sherlean Foot, M.D.    SDL/MEDQ  D:  12/30/2001  T:  01/01/2002  Job:  [id number]

## 2010-10-11 NOTE — Op Note (Signed)
[facility]  Patient:    [name], [name] [facility] Visit Number: [id number] MRN: [id number]          Service Type: SUR Location: 4W [id number] Attending Physician:  Carolan Shiver Ii Dictated by:   Carlisle Beers Dorothyann Gibbs, M.D. Proc. 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  POSTOPERATIVE DIAGNOSIS:  Dislocated bearing, left total knee with capsular stretch.  SURGICAL PROCEDURE:  Closed manipulation followed by open reduction following by bearing exchange between tibial and femoral bearing, left total knee.  SURGEON:  John L. Rendall III, M.D.  ASSISTANT:  Arnoldo Morale, P.A.-C.  ANESTHESIA:  General.  PATHOLOGY:  The patients tibial rotating platform bearing had dislocated posterolaterally, and there was the appearance of a complete stretch of the medial capsule of the knee.  At the time of surgery, the spun bearing was causing erosion of the posterior aspect of the patellar tendon, thinning and fraying the patellar tendon.  The bearing itself was beginning to show some deformation where the femoral component was striking in a nonintended way. The medial capsule was actually found at the time of open surgery to be intact, and not pulled loose.  DESCRIPTION OF PROCEDURE:  Under general anesthesia, the left leg was manipulated in an attempt to correct the valgus deformity, and this was completely unsuccessful.  The displaced bearing did not budge.  At this point, the leg was prepared with Betadine, and draped in the sterile field using the nonsterile proximal thigh tourniquet.  Previous surgical incision was reopened, down through skin and subcutaneous tissue, carefully exposing. Retinaculum and capsule was carefully evaluated under direct vision.  Stitches were intact and had not  apparently pulled out, although there was fraying of the patellar tendon seen from behind as a result of bearing deformity. Previous surgical capsule incision was then reopened.  Normal residual hematoma was found, but a deep culture was obtained just to be thorough.  The spun bearing required a fairly significant medial capsular release from the tibia in order to allow it to be reduced.  Once it was reduced, the knee was stable in 90 degrees of flexion.  The knee was stable in full extension, but in 60 degrees flexion, it was possible to redislocate the bearing.  This was felt to be a problem of capsular stretching.  At this point, the 20 mm bearing was removed.  Trial reduction of a 22.5 deep dish bearing which was a slightly deeper and more stable bearing revealed excellent range of motion, stability, and full flexion, full extension, and the 60 degree flexed position. Permanent component was then obtained and inserted.  At this point, the knee was copiously irrigated with antibiotic solution.  The tourniquet was let down.  Towel clips were applied to the incision.  Testing of the stability one more time revealed it to be absolutely stable.  No impingement of the patella on the bearing in 95 degrees flexion, even though now there appeared to be a relative patella baja.  At this point, with the tourniquet down, several small vessels were cauterized.  The knee was then closed with layers with #1 Tycron, 0 and 2-0 Vicryl, and skin clips.  Tourniquet time was approximately 30 minutes and another  20 minutes was required for wound closure.  Dr. Shireen Quan applied a femoral nerve block at the end of the case. Dictated by:   Carlisle Beers. Dorothyann Gibbs, M.D. Attending Physician:  Carolan Shiver Ii DD:  04/14/01 TD:  04/15/01 Job: [id number]

## 2010-10-11 NOTE — Op Note (Signed)
NAMEMAXWELL, [name]                          ACCOUNT NO.:  [id number]   MEDICAL RECORD NO.:  [id number]                   PATIENT TYPE:  INP   LOCATION:  5032                                 FACILITY:  [facility]   PHYSICIAN:  Mila Homer. Sherlean Foot, M.D.              DATE OF BIRTH:  June 24, 1945   DATE OF PROCEDURE:  01/14/2002  DATE OF DISCHARGE:                                 OPERATIVE REPORT   PREOPERATIVE DIAGNOSIS:  Failed, infected left total knee arthroplasty.   POSTOPERATIVE DIAGNOSIS:  Failed, infected left total knee arthroplasty.   PROCEDURE:  Revision left total knee arthroplasty.   SURGEON:  Mila Homer. Sherlean Foot, M.D.   ASSISTANT:  Jamelle Rushing, P.A.   ANESTHESIA:  General.   TOURNIQUET TIME:  1 hour 16 minute.   COMPLICATIONS:  None.   DRAINS:  One Hemovac.   ESTIMATED BLOOD LOSS:  400 cc.   INDICATIONS:  The patient is status post infected revision arthroplasty with  removal of antibiotic cement spacer and five to six weeks of IV antibiotics.  Informed consent was obtained.   DESCRIPTION OF PROCEDURE:  The patient was laid supine and administered  general anesthesia, a Foley catheter placed, and her left lower extremity  was prepped and draped in the usual sterile fashion.  Old incision was used  and made with a #10 blade.  A new clean blade was used to make a median  parapatellar arthrotomy.  The antibiotic spacers were broken up with an  osteotome and removed.  I then performed a synovectomy in the medial and  lateral gutters and removed all soft tissue and scar tissue from the end of  the femur and the top of the tibia.  I then had to perform a quadriceps snip  to gain flexion.  There was a very small remnant shell of the patella, and I  elected not to attempt to replace the back of the patella.  I then used  spacer blocks and found that a 17 spacer block gave good flexion-extension  gap balance.  I did have to dissect all the way around the medial corner and  released the semimembranosus tendon.  I then reamed sequentially up to 16 on  the tibia, 17 on the femur, and used a size 4 tibial tray, drilled, keeled,  and prepared for a size 4 tibial trial with a 16 mm offset stem, offsetting  and pushing the tibial tray somewhat laterally to gain good seating of the  tibial component.  I then used a size E cutting block to cut the notch just  a little bit deeper.  We used a 5 mm distal augment and a 5 mm posterior  medial augment with an offset 17 mm femoral stem.  With this in place, we  trialled multiple sizes.  The only difficulty was that the tibia had been  scarred posteriorly, the hamstrings were quite tight,  and we had some  posterior subluxation force that was keeping [place] from full extension, but  with a 14 and a 17 both in place, we had good flexion-extension gap  balancing and so we removed our trial components, copiously irrigated with  over 6000 cc of pulse-lavage normal saline.  I then cemented in a size 4  tibia with an offset 16 mm stem, removed excess cement, then went into  flexion and cemented in a size E femoral component with a 5 mm distal and 5  mm posteromedial augment, an offset 17 mm femoral stem.  Then we put a 14 mm  insert in place, let the cement harden in extension.  Once it was hard we  let the tourniquet down, which did, in fact, help the hamstrings loosen up a  bit and pull the tibia forward.  At this point the 17 mm CCK polyethylene  insert snapped and screwed it into place.  We located the knee, took it  through a range of motion.  The quadriceps tendon was quite contracted.  The  patellar tendon was also somewhat frayed on the medial side.  I performed a  closure of the quadriceps snip, closure of the quadriceps and patellar  tendon, and it was under no tension until approximately 50-55 degrees of  flexion.  After that it would probably have torn.  We then closed the deep  soft tissues with interrupted 0 Vicryl, a  running subcuticular 2-0 Vicryl,  skin staples.  We did leave a Hemovac deep to the arthrotomy.  We dressed  with Adaptic, 4 x 4's, sterile Webril, and TED stocking.                                               Mila Homer. Sherlean Foot, M.D.    SDL/MEDQ  D:  01/14/2002  T:  01/18/2002  Job:  [id number]

## 2010-10-11 NOTE — H&P (Signed)
[facility]  Patient:    [name], [name]                      MRN: [id number] Adm. Date:  05/04/00 Attending:  Carlisle Beers. Dorothyann Gibbs, M.D. Dictator:   John L. Dorothyann Gibbs, M.D.                         History and Physical  DATE OF BIRTH:  May 19, 1946  CHIEF COMPLAINT:  Bilateral knee pain, right worse than left since the 1980s.  HISTORY OF PRESENT ILLNESS:  This 65 year old white female presented to Dr. Priscille Kluver with a 10-20 year history of progressively worsening bilateral knee pain.  At this point, the right is worse than the left.  She has been having pain in both the knees since the 1980s and has had two arthroscopies on the left and arthroscopy on the right in 1993.  The last two to three years, the pain in the right knee has been getting progressively worse and now it is much worse than the left.  At this time, the pain is constant in both knees, right worse than left.  The pain in the right knee is located in the anterior medial aspect of the knee and also posteriorly.  There is occasional radiation down its path.  She also has severe leg cramps when the knee bothers her.  The pain is constant and sharp and increases with any walking and standing and decreases with elevation and ice.  The knee does lock and grind when she ambulated up stairs.  It also catches in the medial aspect of the knee.  She has difficulty with prolonged sitting and a lot of stiffness and occasional edema in the knee.  She does have to walk with a cane at times, and she does have some pain at night.  She has tried hyalogen and cortisone in the past, and they have been ineffective in relieving her pain.  She is currently taking Tylenol and ibuprofen for pain, and that provides a moderate amount of relief.  ALLERGIES: 1. PENICILLIN causes hives. 2. CODEINE causes hallucinations. 3. DEMEROL caused extreme sedation.  CURRENT MEDICATIONS: 1. Wellbutrin SR 150 mg 1 tablet  p.o. b.i.d. 2. Celexa 40 mg 1 tablet p.o. q.d. 3. Synthroid 0.175 mg 1 tablet p.o. q.d. 4. ______  20 mg 1 tablet p.o. q.d. 5. Vioxx 25 mg 1 tablet p.o. b.i.d. p.r.n. pain. 6. Prevacid 30 mg 1 tablet p.o. q.d. 7. Sorbitol solution 70%, 2 tablespoons p.o. b.i.d. 8. Nystatin powder 100,000 grams applied to her groin 2-4 times a day as    needed. 9. Albuterol nebulizer inhaled p.r.n. shortness of breath.  PAST MEDICAL HISTORY: 1. She does have a history of an enlarged thyroid in 1983 which required    excision, and she reports she only had 1/2 of a thyroid gland.  She now has    hypothyroidism, and that is followed by Dr. Yehuda Budd. 2. She does have a history of peptic ulcer disease and gastroesophageal reflux    disease, and that is treated by [facility]. 3. She has a history of depression treated by Dr. Yehuda Budd. 4. She also has a history of bronchitis and breast cancer in the right breast.    That was treated with a mastectomy in 1997.  She denies any history of    diabetes mellitus, hypertension, hiatal hernia, heart disease, asthma, or  any chronic medical condition other than noted previously.  PAST SURGICAL HISTORY:  1. Tonsillectomy in 1968.  2. Thyroidectomy in 1983.  3. Right breast lumpectomy in 1997.  4. Right mastectomy with lymph node dissection in July 24, 1995.  5. Repair of a ruptured disk L5-S1 by Dr. Elesa Hacker in 1998.  6. Left knee arthroscopy in September of 1985 by Dr. Rinaldo Ratel.  7. Left knee arthroscopy December 1993 by Dr. Jonny Ruiz L. Rendall.  8. Right knee arthroscopy and repair of a right trigger thumb in May of 1993     by Dr. Jonny Ruiz L. Rendall.  9. Left chest Port-A-Cath in March 1997. 10. Removal of left chest Port-A-Cath April 1997. 11. Placement of a right Hickman catheter in May 1997. 12. Removal of right breast implant in May 1999.  SOCIAL HISTORY:  She quit smoking February 26, 2000.  She has an 80-pack-year history of cigarette smoking.  She  did smoke two packs of cigarettes a day. She does not drink any alcohol nor use any drugs.   She is married and does not have any children.  She and her husband live in a one-story house with four steps into the main entrance.  She is a retired Midwife.  Her medical doctor is Dr. Dewain Penning at [facility].  FAMILY HISTORY:  Her mother is alive at age 67 with no major medical problems. Her father died at the age of 58 with lung cancer, kidney cancer, myocardial infarction, and congestive heart failure.  She has three brothers who are alive, one at age 74 with peptic ulcer disease, one age 57, and one age 17. She has two sisters age 50 and 45, and they are alive and well.  REVIEW OF SYSTEMS:  She did have one positive lymph node with her lymph node biopsy in 1997 and underwent chemotherapy treatment.  Her Port-A-Cath did get infected and eventually cause removal of her right breast implant.  She does have occasional tinnitus in her right ear.  She does have some pain and cracking in her jaw at times.  She also has cervical spine arthritis and pain in her neck p.r.n.  She has dyspnea on exertion, history of hemorrhoids and frequent constipation for which she takes sorbitol regularly.  She does have some urinary frequency.  She is being treated at this time for a yeast infection in her groin, and that is almost resolved.  She does wear glasses. Her last normal menstrual period was in 1997.  She complains of occasional paresthesias in both legs at times.  She has gained about 25 pounds since she quit smoking three months ago.  She does not have a living will or Power-of-Attorney.  All other systems are negative and not contributory at this time.  PHYSICAL EXAMINATION:  Well-developed, well-nourished overweight white female in no acute distress.  Does walk with a limp on the right.  Mood and affect are appropriate.  Talks easily with examiner.  Height 57", weight 274  lbs., BMI is 42.5  VITAL SIGNS: Temperature 99.1 degrees Fahrenheit, pulse 100, respirations 24, blood pressure 136/76.   HEENT:  Normocephalic, atraumatic without frontal or maxillary sinus tenderness to palpation.  Conjunctivae pink, sclerae anicteric.  PERRLA.  EOMs intact.  Funduscopic exam shows visible red reflux bilaterally with normal retinal vasculature, optic disc, and cup.  No visible external ear deformities.  Hearing grossly intact.  Tympanic membranes are pearly gray bilaterally with good light reflex.  Nose and nasal septum  midline.  Nasal mucosa pink and moist without exudates or polyps noted.  Buccal mucosa pink and moist.  Good dentition.  Pharynx without erythema or exudates.  Tongue and uvula midline.  Tongue without fasciculations and uvular rises equally with phonation.  NECK:  She has a well healed thyroid incision located at the base of her neck. Skin is intact without redness or ecchymosis.  Trachea midline.  No palpable lymphadenopathy nor thyromegaly.  Carotids +2 bilaterally without bruits.  She has some pain with lateral bending to the left, but otherwise full range of motion and nontender to palpation along the cervical spine.  CARDIOVASCULAR:  Heart rate and rhythm regular.  S1, S2 present without rubs, clicks, or murmurs noted.  RESPIRATORY:  Respirations even and unlabored.  Breath sounds clear to auscultation bilaterally without rales or wheezes noted.  ABDOMEN:  Rounded abdominal contour.  Bowel sounds present x 4 quadrants. Soft, nontender to palpation without hepatosplenomegaly nor CVA tenderness to palpation.  Nontender to palpation along the entire length of the vertebral column.  Femoral pulses are +2 bilaterally.  BREASTS:  She has a well healed right breast incision line.  She also has a small reddened incision line located over the left breast and also in the midline area.  These are well healed and approximated.  No obvious  masses noted.  GU/RECTAL/PELVIC:  These exams deferred at this time.  MUSCULOSKELETAL:  No obvious deformities of her bilateral upper extremities with full range of motion of these extremities without pain.  Radial pulses are +2 bilaterally.  She has full range of motion of her hips, ankles, and toes bilaterally.  DP and PT pulses are +2.  She is lacking 10 degrees of full extension of the left knee and can flex to only about 90 degrees.  She has pain with palpation, more so on lateral than the medial joint line.  There is a moderate amount of crepitus with range of motion of the left knee and a minimal effusion.  Range of motion and palpation on the knee does cause severe pain and cramping in her lower leg.  Collateral ligaments appear stable. Right knee is lacking 10 degrees of full extension and also can flex to 90 degrees.  She has pain with palpation on both the medial and lateral joint line and a moderate amount of crepitus with range of motion.  There is a small effusion in the knee, and collateral ligaments are stable.  NEUROLOGIC:  Alert and oriented x 3.  Cranial nerves 2-12 grossly intact. Strength 5/5 bilateral upper and lower extremities.  Rapid alternating movements intact.  Deep tendon reflexes 2+ bilateral upper and lower extremities.  Sensation intact to light touch.  RADIOLOGIC FINDINGS:  X-rays taken in 1993 of the right knee showed medial and lateral compartment collapse with advanced osteoarthritic changes.  Repeat x-rays taken since that time have shown bone against bone changes in her right knee.  IMPRESSION:  1. End-stage osteoarthritis bilateral knees, right worse than left.  2. Obesity.  3. Hypothyroidism.  4. Peptic ulcer disease.  5. Gastroesophageal reflux disease.  6. History of depression.  7. History of bronchitis.  8. History of breast cancer with lymph node dissection and right mastectomy.  9. Yeast infection, groin. 10. Chronic  constipation.  PLAN:  Ms. Riebe will be admitted to [facility] on May 04, 2000, where she will undergo a right total knee arthroplasty by Dr. Jonny Ruiz L. Rendall.  She will undergo all the routine preoperative  laboratory tests and studies prior to this procedure.  She is to have no blood pressures or sticks done in her right arm.  If we have any medical problems while she is hospitalized, we will contact Dr. Yehuda Budd office at that time. DD:  05/01/00 TD:  05/02/00 Job: [id number]

## 2010-10-11 NOTE — Discharge Summary (Signed)
[place]  Patient:    [name], [name]                     MRN: [id number] Adm. Date:  [id number] Disc. Date: 05/11/00 Attending:  Herold Harms Dictator:   Mcarthur Rossetti. Angiulli, P.A. CC:         John L. Dorothyann Gibbs, M.D.  Tammy R. Collins Scotland, M.D.   Discharge Summary  DISCHARGE DIAGNOSES: 1. Right total knee replacement March 04, 2000. 2. Postoperative anemia. 3. Hypothyroidism. 4. Peptic ulcer disease. 5. Depression. 6. History of bronchitis. 7. Right mastectomy.  HISTORY OF PRESENT ILLNESS:  This is a 65 year old female admitted to [facility] March 04, 2000, with progressive bilateral knee pain, right greater than left.  No relief with conservative care.  X-rays with end-stage degenerative joint disease.  Underwent a right total knee replacement per Dr. Priscille Kluver on March 04, 2000.  Placed on Coumadin for deep vein thrombosis prophylaxis and weightbearing as tolerated.  Postoperative anemia and pain management.  Noted ongoing chronic cough with history of bronchitis.  Chest x-ray x 2:  No active disease, minimal atelectasis.  Placed on Tequin May 07, 2000, for four days for empiric coverage.  Ambulating short distances with a walker.  No chest pain or shortness of breath.  Latest INR of 2.2. Chemistries unremarkable.  Hemoglobin 8.5.  Admitted for comprehensive rehabilitation program.  PAST MEDICAL HISTORY:  See discharge diagnoses.  ALLERGIES:  PENICILLIN, CODEINE, and DEMEROL.  PRIMARY M.D.:  Dr. Collins Scotland.  PAST SURGICAL HISTORY: 1. Tonsillectomy. 2. Thyroid resection. 3. Back surgery in 1998. 4. Right trigger thumb 1993.  MEDICATIONS PRIOR TO ADMISSION: 1. Wellbutrin SR 150 mg twice daily. 2. Celexa 40 mg daily. 3. Synthroid daily. 4. Vioxx daily. 5. Prevacid 30 mg daily. 6. Albuterol inhaler as needed. 7. Nolvadex daily.  TOBACCO/ALCOHOL:  Quit smoking in October 2001.  No alcohol.  SOCIAL HISTORY:   Married, no children.  Retired Midwife.  One-level home, four steps to entry.  Lives with her husband.  He is on medical retirement secondary to knee problems from [place].  HOSPITAL COURSE:  The patient did well while on rehabilitation services with therapies initiated on a b.i.d. basis.  The following issues were followed during the patients rehabilitation course.  Pertaining to Ms. Rodrigues right total knee replacement, remained stable.  Surgical sites healing nicely.  No signs of infection.  She was using a walker, independent in her room, weightbearing as tolerated.  She continued on Coumadin for deep vein thrombosis prophylaxis.  Venous Doppler studies prior to her discharge were negative.  She would be followed by her primary M.D., Dr. Collins Scotland. Postoperative anemia was stable.  Continued on iron supplement.  Latest hemoglobin of 8.7, hematocrit 24.6.  No bleeding episodes.  Hormone supplement for her hypothyroidism.  She continued on home regimen of Wellbutrin and Celexa for her history of depression.  She had a long history of bronchitis. She continued on her inhalers.  Chest x-ray x 2 was negative.  White blood cell count 5.6.  She had a urinalysis study, also negative.  She had completed a four-day course of Tequin May 10, 2000, for empiric coverage.  She was placed on Humibid for some atelectasis, which greatly improved.  She had no shortness of breath.  Overall, for her functional mobility she was ambulating extended household distances with a walker.  Essentially independent to standby assist in all areas of activities of daily living,  in dressing, grooming, and homemaking.  Overall, her strength and endurance greatly improved as she was encouraged in her overall progress and discharged to home.  Latest laboratories May 11, 2000, showed an INR of 2.3.  Latest hemoglobin of 8.7, hematocrit 24.6.  Sodium 138, potassium 4.4, BUN 10, creatinine 0.8.  DISCHARGE  MEDICATIONS: 1. Coumadin daily with dose to be established at the time of discharge to    complete Coumadin protocol. 2. Synthroid 175 mcg daily. 3. Nolvadex 20 mg daily. 4. Trinsicon twice daily. 5. Prevacid 40 mg daily.  6. Celexa 40 mg daily.  7. Wellbutrin SR 150 mg twice daily.  8. OxyContin CR 10 mg every 12 hours x 1 week.  9. Oxycodone as needed for pain. 10. Albuterol inhaler as advised. 11. Humibid L.A. 600 mg twice daily.  ACTIVITY:  Weightbearing as tolerated with walker.  DIET:  Regular.  WOUND CARE:  Follow up with Dr. Priscille Kluver in one week for removal of staples.  SPECIAL INSTRUCTIONS:  No driving.  No aspirin or ibuprofen while on Coumadin.  FOLLOW-UP:  Arrangements were to be made for Dr. Collins Scotland, [phone], to follow Coumadin until protocol completed. DD:  05/11/00 TD:  05/11/00 Job: [id number]

## 2010-10-11 NOTE — H&P (Signed)
[facility]  Patient:    [name], [name]                     MRN: [id number] Adm. Date:  [id number] Attending:  Anastasio Auerbach CC:         Tammy R. Collins Scotland, M.D.  Darden Palmer., M.D.  John L. Dorothyann Gibbs, M.D.  Genene Churn. Cyndie Chime, M.D.  Clinton D. Maple Hudson, M.D.   History and Physical  DATE OF BIRTH:  [birth year]  CHIEF COMPLAINT:  "I just cant catch my breath."  HISTORY OF PRESENT ILLNESS:  [name] is a 64 year old heavy set Caucasian female who presents with a one and a half week history of shortness of breath. She was seen in the outpatient clinic eight days ago and treated with Biaxin and a steroid taper.  She has failed to respond appreciably to this treatment and has been using her nebulizers q.4h. at home.  [name] has a long history of tobacco use, but quit in October 2001.  She also describes some possible asbestos exposure through work.  According to the patient since spring of this year she has had significant difficulty with progressive dyspnea on exertion.  She even underwent cardiac catheterization in June 2002 as there were some concerns about manifestations of coronary artery disease. Reports indicated normal ejection fraction with normal coronary arteries. There was no comment on pulmonary or right heart pressures.  She also describes a history of gastroesophageal reflux disease as well as possible allergic rhinitis with chronic mold exposure at home.  During this episode she has described chills, but has not had documented fevers.  No diarrhea.  Her sister was diagnosed with histoplasmoma pneumonia approximately three weeks ago but given the mode of transmission I doubt this is related.  ALLERGIES:  PENICILLIN (hives), CODEINE (disorientation).  CURRENT MEDICATIONS:  1. Celexa 40 mg q.d.  2. Synthroid 200 mcg q.d.  3. Zantac 150 b.i.d.  4. Wellbutrin SR 150 b.i.d.  5. Guaifenesin DM two b.i.d.  6. Prevacid  30 mg q.d.  7. Tamoxifen 10 mg b.i.d.  8. Prednisone 20 mg q.d. (completing taper).  9. Nystatin powder p.r.n. 10. Tussionex p.r.n. 11. Albuterol nebulizer q.4h. p.r.n. 12. Clindamycin 150 mg p.r.n. dental procedures.  PAST MEDICAL HISTORY:  1. Recurrent episodes of bronchitis with COPD exacerbation.  2. Gastroesophageal reflux disease.  3. Chronic skin fungal infection/panniculitis.  4. Mild obesity.  5. Breast cancer.     a. Right mastectomy with positive nodes 1997.     b. Status post chemotherapy.  6. History of recurrent bacterial infections since 1997.  7. Osteoarthritis.     a. Status post right knee replacement October 2001.  8. Hypothyroidism.     a. Thyroidectomy 1983.  9. Chronic headaches. 10. Posttraumatic stress disorder/depression. 11. Chronic constipation. 12. Stress incontinence. 13. History of ruptured disk. 14. Recent diagnosis of glucose intolerance.     a. Glycohemoglobin June 2002 5.3%.  SOCIAL HISTORY:  [name] is married.  She quit smoking in October 2001. Previously a 40+ pack year history.  Denies alcohol or drug use.  FAMILY HISTORY:  Father with heart disease as well as lung cancer.  Diabetes in mother.  No history of progressive lung disease except for the cancer for which her father had.  REVIEW OF SYSTEMS:  GENERAL:  No significant weight changes.  Occasional blurred vision in the right eye.  Chronic frontal headaches, postnasal drip, left ear fullness and fullness  in the left neck over the past 10 days, no difficulty swallowing, no hemoptysis, no odynophagia, no chest pain.  Positive dyspnea on exertion.  No abdominal pain.  Chronic constipation.  Chronic stress incontinence.  Intermittent lower extremity edema.  Chronic knee and hip pain.  No history of stroke or MI.  No history of kidney or liver problems.  Review of systems except for mentioned above and previously is otherwise negative.  PHYSICAL EXAMINATION  GENERAL:  Alert and  oriented x 3.  Appropriate.  Coughs frequently during the examination, but in no acute distress.  VITAL SIGNS:  Temperature 98.1, heart rate 78, blood pressure 136/93, respiratory rate 20, oxygen saturation 92% on room air.  HEENT:  Pupils are equal, round and reactive to light.  Sclerae:  Clear. Conjunctivae:  Pink.  Atraumatic, normocephalic.  Mucous membranes slightly dry.  Face somewhat flushed.  NECK:  Supple.  No significantly palpable adenopathy or mass.  I do get a sense of possible fullness on the left, but again, no definite mass.  No bruits.  No JVD.  LUNGS:  Fair air movement bilaterally with end-expiratory wheezes.  No central rhonchi.  No definite crackles.  HEART:  Regular.  Normal S1, S2.  No audible murmurs, rubs, or gallops.  ABDOMEN:  Bowel sounds present.  Obese.  No significant tenderness.  She does have a fungal infection under her pannus.  This has residual powder on it. There are satellite lesions to suggest a fungal nature.  There does not appear to be any bacterial suprainfection.  No definite mass or organomegaly.  CHEST:  Status post right mastectomy.  Scar over the central chest as well as the left chest related to catheter access for chemotherapy.  No palpable masses.  MUSCULOSKELETAL:  No atrophy.  No fasciculations.  No palpable cords.  Pulses 2+ bilaterally.  NEUROLOGIC:  Patient is alert and oriented x 3 as mentioned above.  Cranial nerves 2-12 grossly intact.  Motor examination reveals 5/5 strength bilaterally to confrontation.  Sensation grossly intact.  Gait is normal. Reflexes 2+ bilaterally with normal relaxation.  LABORATORIES:  Chest x-ray:  Dr. ______ did not send the x-ray with the  patient, however, she reported to me that she had mild hyperexpansion as well as some chronic lung changes and possible venous congestion.  I will repeat this x-ray tomorrow.  Room air blood gas pH 7.49, PCO2 37, PO2 68.  Sodium 144, potassium  5.1, chloride 108, bicarbonate 31, glucose 144, BUN 12, creatinine 0.9, bilirubin 0.7, alkaline phosphatase 72, SGOT 20, SGPT 22, albumin 4.2.  Hemoglobin 12.3, WBC 6200, ANC 5000, ALC 1000, 0% eosinophils.  IMPRESSION: 1. Chronic obstructive pulmonary disease exacerbation.  [name] has    clearly failed outpatient treatment for suspected bronchitis or atypical    pneumonia.  I would opt to broaden her antibiotic coverage to Tequin at    this time and put her on intravenous steroids and continue her nebulizers    and Humibid.  I have also added inhaled steroids in the form of a metered    dose inhaler.  At this point I would like to aggressively treat any    potential gastroesophageal reflux disease with a protime pump inhibitor    b.i.d. and add Nasonex and Claritin for possible allergies and postnasal    drip.  I am going to get a chest x-ray and consider possible chest CT scan    in this instance.  Given [name] history of progressive dyspnea  on    exertion there may be an underlying process that has not yet been    diagnosed.  At this point I will place her on 2 L of oxygen humidified and    follow her symptomatically.  I have not officially consulted Dr. Maple Hudson yet    but should we need a pulmonologist I would call him given the fact that she    has been set up as an outpatient to see him as a new patient on September    24. 2. Glucose intolerance.  [name] has some documented glucose intolerance    in the outpatient setting.  This may be related to steroids given the fact    that she had a normal glycohemoglobin early in June.  However, I will    repeat the glycohemoglobin as we would have had time to see a trend upward.    I have not written for any medications at this time but she may need    something while she is on steroids. DD:  12/22/00 TD:  12/22/00 Job: [id number]

## 2010-10-11 NOTE — Op Note (Signed)
[facility]  Patient:    [name], [name]                     MRN: [id number] Proc. Date: 05/04/00 Adm. Date:  [id number] Attending:  Carolan Shiver Ii                           Operative Report  PREOPERATIVE DIAGNOSIS:  Osteoarthritis, right knee.  POSTOPERATIVE DIAGNOSIS:  Osteoarthritis, right knee.  SURGICAL PROCEDURE:  Right LCS total knee replacement -- fully cemented.  SURGEON:  John L. Dorothyann Gibbs, M.D.  ASSISTANT:  Arnoldo Morale, P.A.  ANESTHESIA:  General.  PATHOLOGY:  Patient had essentially bare bone in the medial compartment, with extensive osteoarthritic changes in the other two compartments, with chronic pain now for the last several years.  DESCRIPTION OF PROCEDURE:  Under general anesthesia, the right leg was prepared with Duraprep and draped as a sterile field.  A midline incision is made, which is carried medial parapatellar deep.  The patella is everted. Multiple small vessels are cauterized.  The femur is sized to the standard. Proximal tibial resection is carried out.  PCL is not preserved.  The first femoral guide is then used and an intercondylar drill hole is placed.  Using the second guide, the anterior and posterior flares of the distal femur are resected, balancing the flexion gap at 12.5 mm.  Intramedullary guide is then used and extension gap is balanced at 12.5 mm.  The recessing guide is then used.  Lamina spreader is then inserted and remnants of the cruciates and menisci are resected, plus spurs on the back of the femoral condyles are removed.  Tibia is then sized to the standard.  A center peg hole is placed. Trial reduction of a standard tibia, 12.5 rotating platform and standard femur reveals excellent fit, alignment and stability.  Bone is considered to have good cancellous bone but it is soft and easily dented.  Decision is made to fully cement the prosthesis.  Patella is osteotomized.  Bony surfaces  are prepared with pulse irrigation.  The prosthesis is fully cemented in place. After cement has hardened, the excess is removed.  Tourniquet is let down at 46 minutes.  Multiple small vessels are cauterized and the wound is then closed in layers with #1 Tycron, 0 and 2-0 Vicryl and skin clips.  Operative time:  Approximately one hour.  Blood loss:  Less than 100 cc.  Patient tolerated the procedure well and returned to recovery in good condition. DD:  05/04/00 TD:  05/04/00 Job: [id number]

## 2010-10-11 NOTE — Op Note (Signed)
[name], [name]                ACCOUNT NO.:  [id number]   MEDICAL RECORD NO.:  [id number]          PATIENT TYPE:  AMB   LOCATION:  DSC                          FACILITY:  [facility]   PHYSICIAN:  Sharlet Salina T. Hoxworth, M.D.DATE OF BIRTH:  07/23/1945   DATE OF PROCEDURE:  03/27/2004  DATE OF DISCHARGE:                                 OPERATIVE REPORT   PREOPERATIVE DIAGNOSIS:  Ductal carcinoma in situ, left breast.   POSTOPERATIVE DIAGNOSIS:  Ductal carcinoma in situ, left breast.   OPERATION PERFORMED:  Needle localization, left breast lumpectomy.   SURGEON:  Lorne Skeens. Hoxworth, M.D.   ANESTHESIA:  LMA general.   INDICATIONS FOR PROCEDURE:  [name] is a 65 year old female with a  personal history of stage 2 cancer of the right breast in 1997, status post  modified mastectomy and adjuvant treatment.  Recent screening mammogram  revealed a small mass approximately 1 cm in the lateral aspect of the left  breast.  Large core needle biopsy has been performed which reveals  intermediate grade ductal carcinoma in situ.  After discussion of surgical  treatment options, she has elected to proceed with needle localized  lumpectomy.  The nature of the procedure, its indications and risks of  bleeding, infection and possible need for further surgery were discussed and  understood.  She is now brought to the operating room for this procedure.   DESCRIPTION OF PROCEDURE:  The patient was brought to the operating room and  placed in supine position on the operating table and laryngeal mask general  anesthesia was induced.  The left breast was widely sterilely prepped and  draped.  She had undergone needle localization with the shaft of the wire  through the lesion in the lateral left breast.  A curvilinear incision was  made in the lateral breast near the wire insertion site but closer to the  mass and dissection was carried down into the subcutaneous tissue.  The wire  was brought into  the wound.  A generous core of breast tissue was then  excised around the shaft of the wire and the specimen and wire were removed.  The wire appeared to be going through the center of the specimen.  Specimen  mammography confirmed the presence of the lesion within the specimen.  Complete hemostasis was obtained with cautery.  The soft tissue was  infiltrated with Marcaine with epinephrine.  The skin was closed with  running subcuticular 4-0 Monocryl and Steri-Strips.  Sponge and needle  counts were correct.  The patient was taken to the recovery room in good  condition.       BTH/MEDQ  D:  03/27/2004  T:  03/27/2004  Job:  [id number]

## 2010-10-11 NOTE — Discharge Summary (Signed)
[facility]  Patient:    [name], [name] [facility] Visit Number: [id number] MRN: [id number]          Service Type: MED Location: [phone] Attending Physician:  Carolan Shiver Ii Dictated by:   Arnoldo Morale, P.A.-C Admit Date:  04/20/2001 Discharge Date: 04/25/2001                             Discharge Summary  ADMISSION DIAGNOSES: 1. Dislocated polyethylene bearing, left knee replacement. 2. Obesity. 3. Hypothyroidism. 4. Peptic ulcer disease.  DISCHARGE DIAGNOSES: 1. Dislocated bearing, left total knee replacement. 2. Acute blood loss anemia secondary to surgery. 3. Obesity. 4. Hypothyroidism. 5. Peptic ulcer disease.  SURGICAL PROCEDURE:  On April 14, 2001, [name] underwent an open arthrotomy with polyethylene bearing exchange of her left total knee replacement by Dr. Jonny Ruiz L. Rendall, assisted by Arnoldo Morale, P.A.-C.  COMPLICATIONS:  None.  CONSULTATIONS: 1. Physical therapy and case management consult on April 15, 2001. 2. Occupational therapy consult on April 16, 2001.  HISTORY OF PRESENT ILLNESS:  This 65 year old white female patient underwent a right knee replacement by Dr. Priscille Kluver on March 29, 2001.  She was seen in the office on the day prior to admission when x-rays taken at that time revealed the tibial poly component of her knee replacement had dislocated posterior laterally.  She is now being admitted for closed versus open reduction of that polyethylene component and possible poly exchange.  HOSPITAL COURSE:  [name] tolerated her surgical procedure well without immediate postoperative complications.  She was subsequently transferred to [facility].  On postoperative day #1, her pain was controlled with medications. Vitals were stable.  Left leg dressing was intact without drainage, and her leg was neurovascularly intact.  Her hemoglobin was 10.3 with hematocrit of 30.8.  She did have some low urine output at  that time, and her maintenance fluid was increased, and she was started on therapy.  She was not to have CPM due to fear of having another problem with poly exchange spinning out, and she was placed in a hinged knee brace.  On postoperative day #2, her urine output had improved with maintenance fluids.  She was afebrile, vital signs stable.  Left knee incision was well approximated with staples, and the hinge brace was in place.  Hemoglobin was stable at 9, with a hematocrit of 26.5.  She did have some problems with constipation at the time, and that was treated with an enema.  Plans were begun for probable discharge home the next day.  Postoperative day #3, she was ready for discharge home, and was discharged home later in the day.  DIET:  She is to resume her regular pre-hospitalization diet.  DISCHARGE MEDICATIONS: 1. She is to resume her pre-hospitalization medications with the addition of    OxyContin 20 mg one tablet p.o. q.12h. 2. Arixstra 2.5 mg subcutaneously q.d. x 8 days.  ACTIVITY:  She is to be out of bed weightbearing as tolerated on the left knee with the hinge brace in place.  She is to ambulate with the use of a walker. She is arranged for home health physical therapy per [facility]. She is not to have CPM anymore on the knee.  WOUND CARE:  She needs to keep the incision clean and dry.  May shower after no drainage from the wound for two days.  She needs to notify Dr. Priscille Kluver  of temperature greater than 101.5, chills, pain unrelieved by pain medications, or foul smelling drainage from the wound.  FOLLOWUP:  She needs to follow up with Dr. Priscille Kluver in our office on April 20, 2001. Needs to call [phone] to set up that appointment.  LABORATORY DATA:  On April 13, 2001, hemoglobin 11.1, hematocrit 33.2.  On April 15, 2001, hemoglobin 10.3, hematocrit 30.8.  On April 16, 2001, hemoglobin 9, hematocrit 26.5.  On April 13, 2001, glucose  169.  On April 15, 2001, glucose 128.  Urinalysis on April 13, 2001, showed amber cloudy urine with trace ketones, trace leukocyte esterase, many epithelials, 0 to 2 white cells, and a few bacteria.  All other laboratory studies were within normal limits.  X-ray taken postoperatively on April 14, 2001, of the left knee showed normal alignment after revision of the knee replacement. Dictated by:   Arnoldo Morale, P.A.-C Attending Physician:  Carolan Shiver Ii DD:  05/05/01 TD:  05/05/01 Job: [id number]

## 2010-10-11 NOTE — Op Note (Signed)
[facility]  PatientSHAUNITA, SENEY [facility] Visit Number: [id number] MRN: [id number]          Service Type: MED Location: [phone] Attending Physician:  Carolan Shiver Ii Dictated by:   Georgena Spurling, M.D. Proc. Date: 04/21/01 Admit Date:  04/20/2001                             Operative Report  PREOPERATIVE DIAGNOSIS:       Failed left total knee replacement.  POSTOPERATIVE DIAGNOSIS:      Failed left total knee replacement.  OPERATION:                    Revision of left total knee replacement.  SURGEON:                      Georgena Spurling, M.D.  ASSISTANT:                    Carlisle Beers. Dorothyann Gibbs, M.D.  ANESTHESIA:                   General endotracheal.  INDICATIONS:                  The patient is a couple of weeks status post primary total knee replacement and has had two episodes of bearing spin out from a mobile bearing knee replacement design.  She was taken back for revision and that revision failed as well with bearing spin out.  Informed consent was obtained for revision to a constrained component.  DESCRIPTION OF PROCEDURE:     The patient was laid supine and administered general endotracheal anesthesia.  A Foley catheter was placed.  The left lower extremity was prepped and draped in the usual sterile fashion.  A midline incision was made with a #10 blade.  Old sutures were removed.  We then made a medial parapatellar arthrotomy where the prior arthrotomy was made, and these sutures were removed as well.  We then could easily dissect off the medial crest of the tibia, bring the knee up into flexion.  The bearing was lodged from the medial compartment and had spun out posteriorly.  We relocated the bearing and tested the knee, and had a lot of flexion instability, and was spinning out from the lateral side posteriorly.  We removed the component.  We removed the polyethylene bearing, and used the small sagittal saw to  develop an _________ between the prosthesis and cement.  We used more revision osteotomes in the femur and we removed the femur first and the tibia second. We did have a fair degree of bone loss on the femoral side.  The tibial side came off very cleanly.  At this point, we used curets and rongeurs to remove all cement and debris, and then we removed the patella in a likewise fashion with the sagittal saw and revision osteotomes.  We then gained access to the tibial and femoral canal, and reamed up to a 15 to accept a stubby stem on the tibia, and to accept a small stem in the femur.  We then used the sagittal guide to size E femoral component, and at this point, we placed it in with a 15 mm stem, pinned it into place, and cut our box.  At this point, we trialed, and we needed a 5 mm medial augment  to get an adequate valgus and angle, and then turned our attention to the tibia.  We freshened up our ________ to get rid of the posterior slope, and then chose a size 4 tray, drilled, and keeled it, and at this point, placed a size 4 with a stubby 15 mm stem on that.  We then placed a size E with a 5 mm distal medial augment and trialed with various poly inserts, and chose the largest size which was a size 23.  What was obvious was that we did have a flexion extension gap mismatch which was a little more loose in flexion than extension.  With the constrained liner in place, we had excellent medial and lateral stability.  We had adequate jump distance and flexion, so we chose these components.  We removed the prosthetic trial, irrigated copiously with 3000 cc of normal saline, and then some "bug juice."  We then mixed up two bags of Palacos cement with 2 g of tobramycin in each batch, and cemented the patella first, tibia second, femur third, and placed a trial 23 mm insert in place, and allowed the cement to harden while in extension.  We removed all excess cement.  Once the cement was  hard, we snapped in the CCK bearing-type screw, and relocated the knee.  Range of motion was 0-110 degrees.  Varus/valgus stability was good.  Anterior and posterior stability was good.  There was a little bit of tilting of the patella, and a slight lateral release was performed which corrected that.  We closed over a drain through the arthrotomy and drained the soft tissues.  We closed the arthrotomy with interrupted #1 Tycron sutures, the deep soft tissues with interrupted 0 Vicryl sutures and a subcuticular 2-0 Vicryl.  We then placed skin staples and dressed with Xeroform dressing, sponges, sterile ABDs, Webril, and Ace wrap.  We then placed the patient in a knee immobilizer and took her to the recovery room where she was in stable condition.  EBL 500 cc.  Drains two Hemovacs. Complications none. Dictated by:   Georgena Spurling, M.D. Attending Physician:  Carolan Shiver Ii DD:  04/21/01 TD:  04/22/01 Job: [id number]

## 2010-10-11 NOTE — Discharge Summary (Signed)
[facility]  Patient:    DARYANA, WHIRLEY                     MRN: [id number] Adm. Date:  [id number] Disc. Date: [id number] Attending:  Anastasio Auerbach CC:         Tammy R. Collins Scotland, M.D.  Clinton D. Maple Hudson, M.D.  Darden Palmer., M.D.  Genene Churn. Cyndie Chime, M.D.  John L. Dorothyann Gibbs, M.D.   Discharge Summary  DATE OF BIRTH:  03/31/1946  DISCHARGE DIAGNOSES:  1. Bronchitis with asthma/ chronic obstructive pulmonary disease     exacerbation:     A. Failed outpatient therapy with one week of antibiotics and steroids.     B. Recent progressive dyspnea on exertion.     C. Quit tobacco October 2001 (questionable history of asbestos exposure).  2. Hypoxia and respiratory distress secondary to #1:     A. Room air blood gas pH 7.50, pCO2 37, and pO2 68.  3. Glucose intolerance while on steroids:     A. Glycohemoglobin 5.4%.     B. Morning CBG on IV steroids 138.  4. Allergic rhinitis.  5. Question vocal cord dysfunction.  6. Progressive dyspnea on exertion since March 2002:     A. Cardiac catheterization June 2002:  Ejection fraction normal, normal        coronaries.  7. Obesity.  8. Chronic urinary incontinence:     A. Primarily stress incontinence.  9. Gastroesophageal reflux disease. 10. 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 11. History of breast cancer:     A. Mastectomy with positive lymph nodes in 1997.     B. Status post chemotherapy. 12. Osteoarthritis:     A. Right knee replacement, October 2001. 13. Hypothyroidism:     A. Thyroidectomy in 1983.     B. TSH 3.70 this admission. 14. Chronic headaches. 15. Depression/post-traumatic stress disorder. 16. Chronic constipation. 17. History of ruptured disk.  ALLERGIES:  1. PENICILLIN (hives).  2. CODEINE (disorientation).  DISCHARGE MEDICATIONS:  1. New, Tequin 400 mg p.o. q.d. x 6 days, total 10 day therapy.  2. New, Atrovent nebulizer 0.5 mg q.6h. x 7 days, then  convert to inhaler.  3. Albuterol nebulizer 2.5 q.6h. x 7 days, then convert to inhaler.  4. New, Combivent MDI with spacer 2 puffs q.6h, to be started after seven     days.  5. New, Nasonex 50 mcg 2 sprays in each nostril q.d.  6. Guaifenesin DM 2 pills b.i.d.  7. New, Flovent MDI with spacer 220 2 puffs b.i.d.  8. New, Claritin 10 mg q.d.  9. Prednisone 20 mg, continue taper you were on prior to admission, stop when     you run out.  I believe it is due to last only three to four more days. 10. New, Ditropan XL 10 mg q.d. 11. Nystatin powder, apply to clean dry pannus 2 x q.d. 12. Increased dose, Prevacid 30 mg p.o. b.i.d. 13. Tamoxifen 10 mg b.i.d. 14. Celexa 40 mg q.d. 15. Synthroid 200 mcg q.d. 16. Wellbutrin SR 150 mg p.o. b.i.d.  CONDITION ON DISCHARGE:  Stable.  Breathing definitely improved from presentation.  Oxygen saturation 92% on room air.  Discharge weight 276.9 pounds.  DISPOSITION:  Home.  RECOMMENDED ACTIVITY:  No strenuous until followup with Dr. Collins Scotland.  RECOMMENDED DIET:  Low fat, no concentrated sweets, drink plenty of water, and eat small frequent meals.  WOUND CARE:  Keep pannus clean and  dry.  SPECIAL INSTRUCTIONS:  1. Do not take Zantac (Prevacid replaces).  2. Call if problems or questions.  I did update Dr. Collins Scotland on the day of     discharge.  FOLLOWUP:  1. Dr. Herb Grays, Thursday, December 31, 2000, 2:30.  The patient was     instructed to bring her discharge medication list.  2. The patient is to keep her previously scheduled appointment with Dr. Fannie Knee, February 16, 2001, at 9:40.  The patient is to come to that     appointment 30 minutes early to fill out paperwork.  CONSULTATIONS:  None.  PROCEDURES:  Two-view chest x-ray, December 23, 2000, borderline cardiac size, mild chronic bronchitic changes, no acute infiltrative or edematous change.  HOSPITAL COURSE:  #1 - BRONCHITIS WITH ASTHMA/CHRONIC OBSTRUCTIVE PULMONARY  DISEASE EXACERBATION:  Ms. Hasten is a 65 year old, heavy set female who presents with shortness of breath.  One week ago she was seen in the outpatient clinic with evidence of wheezing and cough, and she was placed on Biaxin and a steroid taper.  Her symptoms have not significantly improved.  She was admitted to the hospital for further evaluation and treatment.  On presentation she was dyspneic with a room air gas demonstrating pH 7.50, pCO2 37, pO2 68.  She did have audible expiratory wheezing.  She told me she quit smoking in October 2001 and that she may have been exposed to asbestos years ago at work.  Her admission x-ray was unremarkable, except for some chronic bronchitic changes.  I reviewed films from December and compared them to now.  There was no appreciable change.  Ms. Kirsten did have a chest CT with contrast back in March 2002 for followup of her cancer.  At that time there was no interstitial abnormalities, and I felt like repeat CT would not be helpful.  We did, however, do a pulmonary function test.  I recognized this is somewhat of an acute setting, but it may be helpful to compare them to convalescent readings. I did discuss this with Dr. Maple Hudson who will be seeing her in the near future.  Oddly enough as I dictate this dictation, the pulmonary function tests, which were certainly present on the patients chart, are not with her stack in medical records.  From memory, she only had mild obstructive disease and had no significant response to bronchodilator and no reduction in her diffusion capacity.  I went back and talked with her because I was concerned about this potentially being a vocal cord dysfunction type picture.  She did state it was  more difficult breathing in than out, and she certainly does continue to have a rough cough that seems to come from her throat.  At this point she is clinically improved on aggressive treatment of both any reactive airway disease  component as well as treatment of any postnasal drip and gastroesophageal reflux disease.  At this point she is stable for discharge. She will follow up with Dr. Collins Scotland and certainly keep her appointment with Dr. Fannie Knee.  #2 - GLUCOSE INTOLERANCE ON HIGH DOSE STEROIDS:  Glycohemoglobin was normal at 5.4%.  She will be on a rapid taper as she goes home.  She will need to be followed closely in the outpatient setting over the months and years to ensure she does not develop overt diabetes.  DISCHARGE LABORATORY:  Sed rate 0, sodium 140, potassium 4.5, chloride 106, bicarb 28, BUN 9, creatinine 0.8, glucose 142.  Legionella urine antigen negative.  I spent greater than 30 minutes arranging discharge. DD:  12/27/00 TD:  12/29/00 Job: [id number]

## 2010-10-11 NOTE — H&P (Signed)
[facility]  Patient:    [name], [name]                       MRN: [id number] Adm. Date:  10/12/00 Attending:  Jonny Ruiz L. Dorothyann Gibbs, M.D. Dictator:   Jamelle Rushing, P.A.                         History and Physical  DATE OF BIRTH:  [birth year]  CHIEF COMPLAINT:  Left knee pain for 8-9 years.  HISTORY OF PRESENT ILLNESS:  The patient is a 65 year old white female with an 8-9 year history of progressively worsening left knee pain.  The patient did have a right total knee arthroplasty performed in December of 2001 with excellent results.  The patient states that her left knee pain has progressively worsened with time.  It bothers her with any type of weight bearing activity.  She has difficulty getting in and out of chairs, in and out of cars, going up and down stairs.  She does have an unstable feeling in the knee.  She does have grinding and swelling in the knee.  Initiation of ambulation is extremely difficult and takes a few moments to get her balance. She does have pain in the knee which is described as a sharp toothache quality of pain that does radiate down into the calf.  It is present at night.  She has occasionally used a cane for assistance in ambulation.  DRUG ALLERGIES:  PENICILLIN, DEMEROL, CODEINE.  CURRENT MEDICATIONS:  1. Darvocet-N 100 p.r.n.  2. Keflex 500 mg p.o. b.i.d. up until May 20.  3. Vioxx 25 mg p.o. b.i.d.  4. Ranitidine 150 mg p.o. q.d.  5. Nolvadex 20 mg p.o. q.d.  6. Wellbutrin 150 mg p.o. b.i.d.  7. Celexa 40 mg p.o. q.d.  8. Prevacid 30 mg p.o. q.d.  9. Synthroid 200 mcg p.o. q.d. 10. Sorbitol solution 70% two tablespoons b.i.d. 11. Albuterol MDI p.r.n.  PREVIOUS MEDICAL HISTORY:  The patient states she did have diabetes induced hypertension in 1968 but no problems since then.  She has had thyroid disease with removal of her thyroid in 1983.  The patient does have a history of hiatal hernia and peptic  ulcer.  The patient recently was diagnosed with an infection of her left breast evaluated by Dr. Johna Sheriff, and was placed on Keflex up until May 20.  The patient does have a history of a breast cancer with a right mastectomy in 1997 with chemotherapy.  The patient is also being treated for depression managed by Dr. Cyndie Chime.  Otherwise the patient denies any history of diabetes, cardiac disease.  PAST SURGICAL HISTORY:  1. Tonsillectomy in 1968.  2. Thyroidectomy in 1983.  3. Right breast lumpectomy in 1997.  4. Right breast mastectomy with lymph node dissection in 1997.  5. Repair of ruptured disk, L5-S1 in 1998.  6. Left knee arthroscopy in 1985.  7. Left knee arthroscopy in 1993.  8. Right knee arthroscopy and trigger finger repair in 1993.  9. Left chest Port-A-Cath in 1997. 10. Placement of Hickman cath in 1997. 11. Removal of right breast implant in 1999. 12. Right total knee arthroplasty in 2001.  The patient states that she has had problems with infections with all previous Port-A-Cath and Hickman cath placements.  She does have significant problems with nausea and vomiting after anesthesia.  SOCIAL HISTORY:  The patient is a 65 year old  white, moderately obese female. She states she stopped smoking in 2001 after smoking two packs a day for in excess of 40 years.  The patient denies any alcohol use or drug use.  She is married and does live with her husband in a one story house with four steps into the main entrance.  The patient does not have any children.  The patient is a retired Midwife.  FAMILY PHYSICIAN:  Dr. Dewain Penning at [facility].  FAMILY MEDICAL HISTORY:  Mother is alive and well at the age of 4.  Father is deceased at 39 years of age from lung cancer and kidney problems and a history of an MI.  Patient has got three brothers alive with histories of abdominal ulcers and migraines.  The patient has got two sisters alive, one of them  with problems with anxiety.  REVIEW OF SYSTEMS:  Positive for chronic afternoon cough.  It does improve with her albuterol nebulizers.  She does not produce any purulent sputum with these coughs.  The patient does sleep with two pillows at night.  This is no change.  The patient does have two lower teeth crowns.  The patient does have some shortness of breath with exertion.  She contributes to her weight, but she does have some periods of diaphoresis with this.  The patient denies any significant chest pains related to the shortness of breath.  The patient has not had any cardiac workup in the past.  The patient does have significant problems with constipation which improves with the Sorbitol.  She does have occasional increased urinary frequency.  She does have a history of anemia with previous chemotherapy.  Otherwise the review of systems areas are negative and noncontributory.  PHYSICAL EXAMINATION:  VITAL SIGNS:  Height is 5 feet 7 inches, weight is 276 pounds.  Respirations 12, temperature is 98.4, blood pressure is 148/80, heart rate 80.  GENERAL:  This is a short in stature, moderately obese white female.  She does ambulate without any significant limp, but she has a little bit of difficulty getting on and off the exam table.  She is a very pleasant conversationalist.  HEENT:  Head is normocephalic, atraumatic, nontender, _________ and frontal sinuses.  Pupils are equal, round, and reactive, accommodating to light. Extraocular movements are intact.  Sclerae are nonicteric.  Conjunctivae are pink and moist.  External ears without deformities, canals patent, TMs pearly gray and intact.  Gross hearing is intact.  Oral buccal mucosa is pink and moist without lesions.  Dentition is in fair repair.  Uvula is midline symmetrically with phonation.  Nasal septum is midline.  Mucous membranes pink and moist, no polyps.  NECK:  Supple, no palpable lymphadenopathy.  Thyroid gland was  nonpalpable. She had a very faint surgery line incision.  She had good range of motion of her cervical spine without any difficulty.   CHEST:  Lung sounds were clear and equal bilaterally.  No wheezes, rales, rhonchi, or rubs noted.  HEART:  Regular rate and rhythm, S1 and S2 was auscultated.  No murmurs, rubs, or gallops noted.  ABDOMEN:  Round, obese, difficulty palpating any _________  due to obesity. She was nontender in all quadrants.  Their bowel sounds were normal active throughout.  CVA was nontender to percussion.  EXTREMITIES:  Upper extremities were symmetrically sized and shape with excellent range of motion of her shoulders, elbows, and wrists.  Motor strength was 5/5 in all muscle groups.  Lower extremities:  Right and left hips had excellent range of motion with full extension and flexion to about 90 degrees, limited by soft tissue and abdomen.  She had 20-30 degrees internal-external rotation without any difficulty.  Right knee had a well healed midline surgical incision.  She had full extension, flexion back to about 100 degrees.  She had no joint line tenderness, no effusion, no anterior or posterior draw, no valgus varus laxity.  Left knee:  There was no sign or erythema or ecchymosis.  She had no palpable effusion.  There was some slight diffuse soft tissue swelling.  She was mitigantly tender along the medial and lateral joint line.  She did have about 20 degrees of valgus varus laxity with a 20 degree valgus deformity, non-weight bearing.  She had no anterior or posterior draw.  Calves were nontender.  Bilateral ankles were symmetrically sized and shape with good dorsi and plantar flexion.  Peripheral vasculature:  Carotid pulses were 2+, no bruits.  Radial pulses 2+, femoral pulses unable to palpate due to obesity.  Dorsalis pedis pulses were 1+, unable to palpate posterior tibial pulses.  The patient had a significant amount of bilateral ankle varicosities,  trace amount of lower extremity edema, venous stasis changes.  NEUROLOGIC:  The patient was conscious, alert, and appropriate, held an easy conversation with the examiner.  Cranial nerves 2-12 were grossly intact. Deep tendon reflexes of the upper and lower extremities were symmetrical.  BREASTS/RECTAL/GENITOURINARY:  Deferred at this time.  IMPRESSION: 1. End-stage osteoarthritis, left knee. 2. Status post right total knee arthroplasty. 3. Hypothyroidism. 4. Hiatal hernia. 5. Peptic ulcer disease. 6. Depression.  PLAN:  The patient will be admitted to [facility] on May 20 under the care of Dr. Jonny Ruiz L. Rendall.  The patient will undergo a left total knee arthroplasty after undergoing all routine labs and tests.  The patient does have an appointment with Dr. Yehuda Budd for medical clearance on May 15. DD:  10/06/00 TD:  10/06/00 Job: [id number]

## 2010-10-11 NOTE — Discharge Summary (Signed)
[name], [name]                ACCOUNT NO.:  [id number]   MEDICAL RECORD NO.:  [id number]          PATIENT TYPE:  INP   LOCATION:  5013                         FACILITY:  [facility]   PHYSICIAN:  Mila Homer. Sherlean Foot, M.D. DATE OF BIRTH:  12/21/1945   DATE OF ADMISSION:  07/07/2005  DATE OF DISCHARGE:  07/11/2005                                 DISCHARGE SUMMARY   ADMISSION DIAGNOSES:  1.  Painful right total knee arthroplasty.  2.  History of multiple revision surgeries on the left knee with most recent      revision January 2003.  3.  Obesity.  4.  Hypothyroidism.  5.  Peptic ulcer disease.  6.  Gastroesophageal reflux disease.  7.  Depression.  8.  Chronic bronchitis and asthma.  9.  History of breast cancer with recent reoccurrence left breast in 2005.  10. History of angina.   DISCHARGE DIAGNOSES:  1.  Painful right total knee arthroplasty status post revision total knee      arthroplasty.  2.  Acute blood loss anemia secondary to surgery.  3.  Constipation.  4.  Hyponatremia.  5.  Hypokalemia.  6.  Atelectasis and mild hypoxia.  7.  History of multiple revision surgeries left knee with most recent in      2003.  8.  Obesity.  9.  Hypothyroidism.  10. Peptic ulcer disease.  11. Gastroesophageal reflux disease.  12. Depression.  13. Chronic bronchitis and asthma.  14. History of breast cancer with recent reoccurrence left breast 2005.  15. History of angina.   SURGICAL PROCEDURE:  On July 07, 2005 [name] underwent a revision of  her right total knee arthroplasty by Dr. Mila Homer. Lucey, assisted by  Richardean Canal PA-C.  She had a NexGen stem extension offset 17 mm diameter  100 mm length with a femoral component size E right.  A stem tibial  component Precoat size 3 with a stem extension offset 16 mm diameter 100 mm  length an all poly patella size 32 8.5 mm thickness.  Distal femoral augment  block site E 5 mm augment and an also 10 mm augment with screws size  E for  the femoral side.  Also a NexGen complete Legacy knee condylar constrained  articular surface with locking screws size __________  EF 17 mm height.   COMPLICATIONS:  None.   CONSULTANT:  1.  Rehabilitation medicine consult July 10, 2005 in addition to a      physical therapy consult at that time.  2.  Occupational therapy consult July 09, 2005.   HISTORY OF PRESENT ILLNESS:  This 65 year old white female patient presented  to Dr. Sherlean Foot with history of bilateral knee replacements.  She has been  having problems with the right knee for the last five to six months.  Pain  is a constant, sharp, throbbing sensation over the medial and anterior  aspect of the knee with radiation into her foot.  It increases with  prolonged standing or walking and decreases with elevation, ibuprofen.  She  complains of the knee catching, locking, and swelling  keeps her up at night.  X-rays show signs of loosening and because of this she is presenting for a  revision of her right total knee arthroplasty.   HOSPITAL COURSE:  [name] tolerated her surgical procedure well without  immediate postoperative complications.  She was transfused with 1 unit of  packed red blood cells postoperatively.  She tolerated that well.  She was  transferred to 5000.  Postoperative day one she was afebrile, vitals stable.  Hemoglobin was 12.5, hematocrit 36.3.  Right knee and dressing was intact.  Leg was neurovascularly intact and she was started on therapy per protocol.  She did have problems with constipation upon admission, had not gone for  three days so treatment was started for that.   Postoperative day two she was afebrile, vitals stable.  Right knee incision  was well-approximated with staples, minimal drainage.  Hemoglobin was 10.4,  hematocrit 29.5.  Sodium 132, potassium 3.1.  Her potassium was supplemented  and she was continued on therapy.   Postoperative day three she was having problems with  hypoxia and low O2  saturations.  Aggressive pulmonary toilet was ordered.  Chest x-ray was  ordered which showed some atelectasis in the right lung base.  Sodium was  138, potassium 3.1.  Supplements were given for the potassium and aggressive  pulmonary toilet for her lungs.   Postoperative day four she is doing very well.  She has effectively had some  bowel movements.  T-max 99.2, vitals stable.  Sodium 140, potassium 4.2.  She did develop some blisters on lateral aspect of the right knee and medial  aspect of the left thigh.  These were intact with no signs of infection.  It  is felt she is ready for discharge home and will be discharged home later  today.   DISCHARGE INSTRUCTIONS:   DIET:  She can resume her regular pre hospitalization diet.   MEDICATIONS:  She may resume her pre hospitalization medications.  These  include:  1.  Calcium plus D 600 mg two tablets p.o. q.a.m.  2.  Multivitamin one tablet p.o. q.a.m.  3.  Femara 2.5 mg p.o. q.a.m.  4.  Nexium 40 mg one tablet p.o. q.a.m.  5.  Bupropion XL 300 mg one tablet p.o. q.a.m.  6.  Levothyroxine 175 mcg one tablet p.o. q.a.m.  7.  Citalopram 40 mg half to one tablet p.o. q.a.m.  8.  Lasix 40 mg one tablet p.o. q.a.m. p.r.n.  9.  Detrol LA 4 mg one tablet p.o. q.a.m.  10. Advair Diskus 250/50 mg one puff inhaled twice daily p.r.n.  11. Albuterol nebulizer p.r.n.  12. Senokot two tablets p.o. q.a.m. p.r.n.   ADDITIONAL MEDICATIONS:  1.  Lovenox 40 mg subcutaneous once a day, last dose July 21, 2005.  2.  Robaxin 500 mg one to two tablets p.o. q.6h. p.r.n. for spasms, 40 with      no refill.  3.  Norco 5/325 mg one to two tablets p.o. q.4h. p.r.n. for pain, 60 with no      refill.   ACTIVITY:  She can be out of bed weightbearing as tolerated on the right  leg.  She is to have home CPM 0-90 degrees six to eight hours a day and home health PT per [facility].  Please see the blue total knee  discharge  sheet for further activity instructions.   WOUND CARE:  She may shower after no drainage from the wound for two  days.  Please see the blue total knee discharge sheet for further wound care  instructions.   FOLLOW-UP:  She is to follow up with Dr. Sherlean Foot in our office on Tuesday,  February 27 and she needs to call [phone] for that appointment.   LABORATORY DATA:  Chest x-ray done on July 01, 2005 showed probable  chronic bronchitis, no active lung disease, and mild cardiomegaly.  Repeat  chest x-ray done on February 15 showed progressive right basilar atelectasis  and elevation of the right hemidiaphragm, no infiltrate.   White count on February 6 was 4.2 and went to a high of 8.2 on the 13th.  Hemoglobin/hematocrit on the 6th were 13.4 and 38.7, had dropped to a low of  9.4 and 26.6 on the 15th.  Platelet ranged from 141 on February 6 to a high  of 381 on February 12 and then 106 on February 15.   Sodium ranged from 138 on February 6 to a low of 132 on the 14th.  Potassium  ranged from 4.4 on the 6th to a low of 3.1 on the 15th to 4.2 on the 16th.  Glucose ranged from 89 on the 6th to a high of 139 on the 14th.  Calcium  ranged from 9 on February 6 to a low of 8.3 on February 15.  All other  laboratory studies were within normal limits.      Legrand Pitts Duffy, P.A.    ______________________________  Mila Homer. Sherlean Foot, M.D.    KED/MEDQ  D:  07/11/2005  T:  07/11/2005  Job:  [id number]   cc:   Georga Hacking, M.D.  Fax: [phone]  Email: [email or web address]   Tammy R. Collins [place], M.D.  Fax: [phone]

## 2010-10-11 NOTE — Discharge Summary (Signed)
[facility]  Patient:    [name], [name] [facility] Visit Number: [id number] MRN: [id number]          Service Type: MED Location: [phone] Attending Physician:  Carolan Shiver Ii Dictated by:   Arnoldo Morale, P.A. Admit Date:  04/20/2001 Discharge Date: 04/25/2001                             Discharge Summary  ADMITTING DIAGNOSES: 1. Dislocated bearing, left total knee replacement. 2. Obesity. 3. Hypothyroidism. 4. Gastroesophageal reflux disease. 5. History of depression and angina. 6. History of right breast cancer.  DISCHARGE DIAGNOSES: 1. Spun bearing left total knee replacement. 2. Acute blood loss anemia secondary to surgery. 3. 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 4. Obesity. 5. Hypothyroidism. 6. Gastroesophageal reflux disease. 7. History of depression and angina. 8. History of right breast cancer.  PROCEDURE:  On April 21, 2001, [name] underwent revision of left total knee arthroplasty by Dr. Georgena Spurling, assisted by Dr. Erasmo Leventhal.  No complications.  CONSULTATIONS: 1. Physical therapy consult on April 23, 2001. 2. Case management on April 25, 2001.  HISTORY OF PRESENT ILLNESS:  This 65 year old, white female patient of Dr. Priscille Kluver underwent a left knee replacement by Dr. Priscille Kluver on March 29, 2001, which was complicated with a spun bearing on April 13, 2001.  She subsequently underwent a poly exchange and open arthrotomy of the left knee replacement on April 14, 2001.  She was discharged from [facility] on November 23, and did well until November 25, when she got out of bed with the immobilizer on.  She felt a pop and had increasing pain in the knee.  X-rays in our office on April 20, 2001, showed the tibial bearing had spun out again.  She is being admitted now for complete revision of her knee replacement.  HOSPITAL COURSE:  [name] was admitted on November 26, and made NPO  after midnight.  On April 21, 2001, she was complaining of some knee pain, but was otherwise stable.  She was given FFP to reverse her Arixtra which she had been on after the previous surgery.  She subsequently underwent a revision of her left knee replacement later that day by Dr. Georgena Spurling, assisted by Dr. Priscille Kluver.  On postop day #1, she had complained of some numbness and tingling in her left leg from the knee down.  Her vital signs were stable.  Hemoglobin was 9.2, hematocrit 27.  She was noted to have some weakness in the peroneals.  She was continued on therapy.  On postop day #2, T-max was 100.3 with vital signs stable.  The leg was neurovascularly intact except the dorsum of her left foot.  Wounds were otherwise stable.  The peroneal still had minimal movement.  The dressing was changed and Hemovac discontinued.  She was started on p.o. pain medications and continued on therapy.  She continued to make good progress with therapy over the next several days. Her peroneals remained unchanged.  Hemoglobin on November 30, was 8.8 with hematocrit of 24.9 and this remained stable and she was asymptomatic with that.  This was just monitored.  On April 25, 2001, she was doing well enough that it was felt okay for her to be discharged to home.  DIET:  She can resume her prehospitalization diet.  DISCHARGE MEDICATIONS: 1. Resume all prehospital medications. 2. OxyContin 20 mg one tablet p.o. q.12h.  3. Oxy-IR 5 mg one to two p.o. q.4-6h. p.r.n. pain. 4. Arixtra 2.5 mg subcu q.d. x 2 days. 5. Keflex 500 mg p.o. q.i.d. 6. Colace 100 mg p.o. b.i.d. 7. Senokot S two tablets p.o. q.h.s. x 1 day.  ACTIVITY:  Out of bed, weightbearing as tolerated on the left leg with use of the walker.  She is to have home health physical therapy per [facility].  She is arranged to have home CPM.  WOUND CARE:  She needs to keep her incision clean and dry.  Notify Dr. Priscille Kluver or Dr.  Sherlean Foot if temperature greater than 101.5, chills, pain unrelieved by pain medications or foul smelling drainage from the wound.  FOLLOWUP:  She needs to follow up with Dr. Priscille Kluver in our office a week after discharge.  She needs to call [phone], to set up that appointment.  ADDENDUM:  It does appear that she was transfused with two units of packed red blood cells from November 26 through November 27.  LABORATORY DATA AND X-RAY FINDINGS:  On April 20, 2001, white count was 3.7, hemoglobin 9, hematocrit 26.5.  On November 28, hemoglobin 9.2, hematocrit 27.  On November 30, hemoglobin 8.8, hematocrit 24.9.  On December 1, white count 3.8, hemoglobin 8.8, hematocrit 25.2 and platelets 172.  On November 26, glucose 161.  On November 28, glucose 131, calcium 7.9.  On December 1, BUN 5, creatinine 0.6 and calcium 8.2.  Wound culture taken on April 21, 2001, showed a few white blood cells predominantly PMNs, but no organisms seen.  A few Staphylococcus species were cultured out and that was resistant to clindamycin, Keflex, erythromycin, Levaquin, oxacillin and penicillin.  It was sensitive to Rifampin and gentamicin only.  Left knee culture was sensitive to tetracycline, Bactrim and vancomycin.  Wound and tissue cultures done on November 27.  All other laboratory studies were within normal limits. Dictated by:   Arnoldo Morale, P.A. Attending Physician:  Carolan Shiver Ii DD:  05/05/01 TD:  05/05/01 Job: [id number]

## 2010-10-11 NOTE — H&P (Signed)
[facility]  Patient:    [name], [name]. Visit Number: [id number] MRN: [id number]          Service Type: Attending:  Carlisle Beers. Dorothyann Gibbs, M.D. Dictated by:   Jamelle Rushing, P.A. Adm. Date:  03/29/01                           History and Physical  DATE OF BIRTH:  08-28-1945  CHIEF COMPLAINT:  Left knee pain since the early 1980s.  HISTORY OF PRESENT ILLNESS:  Patient is a 65 year old white female with a history of bilateral knee pain since the 1980s.  The patient had a right total knee arthroplasty performed on May 04, 2000 with good results.  The patient states that her left knee has progressively worsened and is currently disabling.  The patient has pain with ambulation.  She has pain in her knee with any type of range of motion.  She has difficulty initiating ambulation from the sitting to the standing position.  The patient describes the pain as a deep achy sensation located from the anteromedial spot on the joint line, deep through across the knee to the posterolateral aspect.  She does have sharp shooting sensations in the knee with awkward movements.  She does have a locking-up sensation, she does have grinding, she does have popping and she does have swelling in the knee.  She occasionally has some night discomfort. Patient has not had any relief with the recent cortisone injections and patient is currently requesting a total knee replacement.  ALLERGIES:  1. PENICILLIN.  2. CODEINE.  3. DEMEROL.  CURRENT MEDICATIONS:  1. Wellbutrin SR 150 mg p.o. b.i.d.  2. Celexa 40 mg p.o. q.h.s.  3. Synthroid 200 mcg p.o. q.d.  4. Tamoxifen 10 mg p.o. b.i.d.  5. Prevacid 30 mg p.o. q.d.  6. Celebrex 200 mg p.o. q.d.  7. Advair 250/50 two puffs b.i.d.  8. Sorbitol solution 70% -- two tablespoons b.i.d.  9. Nystatin powder 100,000 g applied to affected areas b.i.d. to q.i.d.     p.r.n. 10. Albuterol nebulizer p.r.n.  PREVIOUS  MEDICAL HISTORY:  1. Thyroid disease.  2. Peptic ulcer disease.  3. History of depression.  4. History of bronchitis.  5. Right breast cancer with right-sided mastectomy.  6. Positive guaiac stools for blood currently being worked up.  7. Angina with cardiac catheterization in June of 2002 by Dr. Donnie Aho.  Patient denies any history of diabetes, hypertension, hiatal hernia or any asthma.  PAST SURGICAL HISTORY:  1. Tonsillectomy in 1968.  2. Thyroidectomy in 1983.  3. Right breast lumpectomy in 1997.  4. Right mastectomy with lymph node dissection in February of 1997.  5. Repair of ruptured disk, L5-S1, in 1998.  6. Left knee arthroscopy in 1995.  7. Left knee arthroscopy in December of 1993.  8. Right knee arthroscopy and repair of right trigger finger in 1993.  9. Left chest Port-A-Cath in 1997 with removal same year. 10. Removal of right breast implant in 1999. 11. Right total knee arthroplasty in 2001.  Patient denies any complications with any of the above-mentioned surgical procedures.  SOCIAL HISTORY:  Patient is a 65 year old white, obese, short-in-stature female who quit smoking February 26, 2000; up until that time, patient had an 80-pack-year smoking history.  She does not drink alcohol or use any drugs. She is married, does not have any children.  She lives with her husband in  a one-story house, four steps into the main entrance.  FAMILY PHYSICIAN:  Dr. Bayard Beaver. Collins [place] at [facility].  CARDIOLOGIST:  Dr. Georga Hacking, Montez Hageman.  FAMILY HISTORY:  Mother is alive at the age of 4, no major medical issues. Father is deceased at 32 from lung cancer, kidney cancer and myocardial infarction.  Patient has three brothers alive with histories of peptic ulcer disease and two sisters alive, in good health.  REVIEW OF SYSTEMS:  Review of systems is positive for recent positive guaiac stools, currently being worked up.  The patient does have loose bowels  with foul odors.  She also has urine with a foul odor.  Patient is currently going to a gastroenterologist for evaluation.  The patient recently was told by her primary care physician she does have increased wbcs and low platelet counts but the patient denies any significant bleeding disorders.  Patient does have decreased cough since stopping smoking.  She does have significant heartburn at times but it is relieved with the Prevacid and over-the-counter Zantac. Patient did have an episode of chest pain this last June, which she had a positive stress test with chest pain, was catheterized and was found to have some slight one-vessel disease, with no current medical treatment.  Patient does have occasional tinnitus in her right ear.  Patient does have problems with hemorrhoids and frequent constipation for which she uses the sorbitol, increased urinary frequency and frequent infections in her groin which improve with nystatin powder.  PHYSICAL EXAMINATION:  VITAL SIGNS:  Height is 5 foot 7 inches.  Weight 276 pounds.  Temperature is 98.7; respirations 14; pulse of 80 and regular; blood pressure is 122/70.  GENERAL:  This patient is a well-developed, well-nourished-appearing, overweight, obese female, ambulates very slowly, gets on and off the exam table very slowly but able to do it by herself.  She does have slow initiation of ambulation from sitting to standing.  She appears to be in no obvious significant distress.  HEENT:  Head is normocephalic, atraumatic, nontender over maxillary or frontal sinuses.  Pupils are equal, round and reactive, accommodating to light. Extraocular movements are intact.  Sclerae are nonicteric.  Conjunctivae are pink and moist.  External ears were without deformities.  Canals patent.  TMs pearly gray and gross hearing is intact.  Nasal septum was midline, mucous membranes pink and moist, no polyps noted.  Oral buccal mucosa was pink and moist without  lesions.  Upper and lower dentition were in good repair.  Uvula  was midline.  Patient is able to swallow without any difficulty.  NECK:  Supple.  No palpable lymphadenopathy.  Patient had good range of motion of her cervical spine without any difficulty or tenderness.  CHEST:  Lung sounds were clear and equal bilaterally.  No wheezes, rales, rhonchi or rubs noted.  HEART:  Regular rate and rhythm.  S1 and S2 were auscultated.  No murmurs, rubs, or gallops noted.  ABDOMEN:  Round, obese.  Bowel sounds were present throughout and normoactive. She had no tenderness to deep palpation.  Was unable to palpate any hepatosplenomegaly due to obesity.  CVA was nontender to percussion.  EXTREMITIES:  Upper extremities were symmetrically sized and shaped.  She had good range of motion of her shoulders, elbows and wrists without any difficulty, with 5/5 motor strength in all muscle groups tested.  Lower extremities:  Right and left hips had full extension, flexion up to 120, 20 to 30 degrees internal/external rotation without  any difficulty or tenderness or mechanical symptoms.  Right knee had a well-healed midline surgical incision, no palpable effusion, no sign of erythema.  She did have some slight mechanical clunking with valgus/varus stress but no significant laxity.  She had full extension and flexion back to about 115 degrees.  Left knee:  There was no sign of erythema or ecchymosis, no sign of any significant soft tissue swelling and no palpable effusion.  She was significantly tender along the medial and lateral joint lines.  She had a significant amount of pain with valgus/varus stress and she had about a 10 to 15 degrees laxity. She had about 10 degrees short of full extension and flexion down to 95 degrees.  She had no anterior or posterior drawer.  Bilateral calves were nontender.  Bilateral ankles were symmetrical with good dorsi and plantar flexion.  PERIPHERAL VASCULAR:   Carotid pulses were 2+, no bruits; radial pulses 2+; unable to palpate femoral pulses; dorsalis pedis pulses and posterior tibial pulses were 2+.  The patient had some slight lower extremity edema which was nonpitting.  No significant venous stasis changes or varicosities.  NEUROLOGIC:  Patient was conscious and appropriate, held an easy conversation with examiner.  Cranial nerves II-XII were grossly intact.  Deep tendon reflexes of the upper and lower extremities were intact.  Patient was grossly intact and had light touch sensation from head to toe.  BREASTS:  Patients right breast was not present due to a previous mastectomy. Breast exam was not completed.  RECTAL AND GU:  Exams were also deferred.  IMPRESSION:  1. Severe arthritis, left knee, with bone on bone in lateral compartment.  2. Obesity.  3. Thyroid disease.  4. Peptic ulcer disease.  5. History of depression.  6. History of angina with recent cardiac catheterization.  7. Guaiac positive for blood, stools currently being worked up.  PLAN:  Patient will be admitted to [facility] on March 29, 2001 under the care of Dr. Jonny Ruiz L. Rendall III.  The patient will undergo all routine labs and tests prior to having a left total knee arthroplasty. Dr. Tawana Scale office was contacted for review of cardiac cath information and this will be forwarded to [facility] anesthesia for further evaluation. Dictated by:   Jamelle Rushing, P.A. Attending:  Carlisle Beers. Dorothyann Gibbs, M.D. DD:  03/23/01 TD:  03/24/01 Job: [id number]

## 2010-10-11 NOTE — H&P (Signed)
[place]  Patient:    [name], [name] [facility] Visit Number: [id number] MRN: [id number]          Service Type: SUR Location: [id number] Attending Physician:  Georgena Spurling Dictated by:   Jamelle Rushing, P.A. Admit Date:  12/10/2001                           History and Physical  DATE OF BIRTH:  May 03, 1946  CHIEF COMPLAINT:  Painful left total knee arthroplasty.  HISTORY OF PRESENT ILLNESS:  The patient is a 65 year old white female with a history of left total knee arthroplasty in November 2002.  The patient initially had problems with multiple spinouts of the poly prior to being discharged.  A revision was performed x2 with the second revision including complete removal of hardware and a restraining type prosthesis placed.  The patient has been having chronic low grade temperatures and pain over the anterior and inferior part of the joint.  The last aspiration that was performed showed a Staph species coag negative.  It was felt that the prosthesis of the joint was involved in infection so she was brought into the hospital for removal of hardware, antibiotic, cement, and placed for six to eight weeks with IV antibiotics and then a revision of new prosthesis.  ALLERGIES:  DEMEROL, CODEINE, PENICILLIN, TAPE.  CURRENT MEDICATIONS: 1. Synthroid 175 mcg p.o. q.d. 2. Nexium 40 mg p.o. q.d. 3. Wellbutrin 200 mg p.o. b.i.d. 4. Celexa 40 mg p.o. q.d.  PAST MEDICAL HISTORY: 1. Thyroid disease. 2. Peptic ulcer disease. 3. History of depression. 4. History of bronchitis. 5. History of right breast cancer with mastectomy. 6. Angina with cardiac catheterization June 2002 by Dr. Donnie Aho.  PAST SURGICAL HISTORY:  1. Left total knee arthroplasty with a revision x2.  2. Tonsillectomy.  3. Thyroidectomy.  4. Right breast lumpectomy.  5. Right breast mastectomy with lymph node dissection.  6. Repair of lumbar disk at L5-S1.  7. Left knee  arthroscopy.  8. Right knee arthroscopy with right trigger finger repair.  9. Left chest Port-A-Cath with removal the same year. 10. Right total knee arthroplasty.  The patient denied any complications with the above mentioned surgical procedures.  SOCIAL HISTORY:  The patient is a 65 year old white obese short in stature female.  Quit smoking around 2001.  The patient does not drink or use any drugs.  She is married.  Her husband works third shift.  They live in a one-story house, four steps into the main entrance.  Family physician is Dr. Dewain Penning, cardiologist Dr. Viann Fish.  FAMILY HISTORY:  Mother is deceased recently.  Father is deceased at age 7 from lung cancer, kidney cancer, myocardial infarction.  The patient has three brothers with a history of peptic ulcer disease and two sisters who are in good health.  REVIEW OF SYSTEMS:  Positive for chronic discomfort in left knee with occasional low grade temperatures.  Otherwise, negative.  PHYSICAL EXAMINATION  VITAL SIGNS:  Height 5 feet 7 inches, weight 254 pounds, temperature 97.8, heart rate 83 and regular, blood pressure 118/75.  GENERAL:  This is a short in stature, slightly heavy set white female who appears healthy, well-developed who appears to be on the down and depressed side due to her current situation.  HEENT:  Head was normocephalic.  Pupils were equal and round and reactive, accommodating to light.  Extraocular movements intact.  Michaell Cowing  hearing is intact.  Oral buccal mucosa without lesions.  NECK:  Supple.  No palpable lymphadenopathy.  The patient had good range of motion of cervical spine.  CHEST:  Lung sounds were clear and equal bilaterally.  HEART:  Regular rate and rhythm.  ABDOMEN:  Round, obese, soft, nontender with normoactive bowel sounds throughout.  CVA was nontender.  EXTREMITIES:  Upper extremities were symmetrically sized and shaped.  She had good range of motion of her  shoulders, elbows, and wrists.  Motor strength 5/5.  Lower extremities, right and left hip had full range of motion without any deficits.  Left knee had a well healed midline surgical incision.  There was some diffuse erythema and some slight skin breakdown across the anterior inferior part of the knee.  It was quite tender along the anterior aspect of the knee.  She had about 10 degrees short of full extension and flexion back to 90 degrees.  There was no joint instability.  The calf was nontender. Right knee had a well healed surgical incision.  There was no sign of erythema or ecchymosis.  She was not tender throughout.  It was stable.  Calf was nontender.  Ankles were symmetric with good dorsi and plantar flexion.  CHEST WALL:  The patient had an obvious right-sided mastectomy.  PERIPHERAL VASCULAR:  Carotid pulses were 2+.  No bruits.  Radial pulses 2+. Posterior tibial pulses were 2+.  Unable to palpate dorsalis pedis.  She had no lower extremity edema or venous stasis changes.  NEUROLOGIC:  The patient was conscious, alert, and appropriate.  She appeared to be slightly down and depressed.  Cranial nerves II-XII were grossly intact. Deep tendon reflexes were symmetrical.  RECTAL:  Deferred.  GENITOURINARY:  Deferred.  IMPRESSION: 1. Infected left total knee arthroplasty. 2. Right total knee arthroplasty. 3. Obesity. 4. Thyroid disease. 5. Peptic ulcer disease. 6. History of depression. 7. History of angina.  PLAN:  The patient will be admitted to [facility] under the care of Dr. Georgena Spurling.  The patient will undergo a removal of all left total knee arthroplasty hardware and place implementing an antibiotic impregnated cement spacer, an infectious disease consult and treatment with possible IV antibiotics for six to eight weeks.  Once the patient is deemed clear of infection, we will revise her total knee arthroplasty. Dictated by:   Jamelle Rushing,  P.A. Attending Physician:  Georgena Spurling DD:  12/11/01 TD:  12/15/01  Job: [id number]

## 2010-10-11 NOTE — Discharge Summary (Signed)
NAMELUCIENNE, Moreno                            ACCOUNT NO.:  [id number]   MEDICAL RECORD NO.:  [id number]                   PATIENT TYPE:   LOCATION:                                       FACILITY:   PHYSICIAN:  [name]. [name], M.D.              DATE OF BIRTH:  June 19, 1945   DATE OF ADMISSION:  01/14/2002  DATE OF DISCHARGE:  01/18/2002                                 DISCHARGE SUMMARY   ADMISSION DIAGNOSES:  1. Infected left total knee arthroplasty.  2. Reflux disease.  3. Depression.  4. Thyroid disease.   DISCHARGE DIAGNOSES:  1. Revision left total knee arthroplasty status post infection.  2. Postoperative blood loss anemia.  3. Obesity.   HISTORY OF PRESENT ILLNESS:  The patient is a 65 year old white female who  has had one-year history of significant problems with her left total knee  arthroplasty.  The patient initially had several episodes in which after  having a total knee arthroplasty the poly would spin out.  This was revised  three times and on the third time the patient  did not have problems with  continued spinout.  The patient after several months had chronic pain and  eventually positive cultures were obtained for infection and then the  revised total knee arthroplasty was removed and had an antibiotic  impregnated spacer with IV antibiotics for six to eight weeks.  The patient  has completed her IV antibiotics and is now presenting for a revision of her  left total knee arthroplasty.   ALLERGIES:  1. PENICILLIN.  2. CODEINE.   CURRENT MEDICATIONS:  1. Synthroid 200 mcg p.o. q.d.  2. Nexium 40 mg p.o. q.d.  3. Wellbutrin 200 mg p.o. q.d.  4. Celexa 60 mg p.o. q.d.  5. OxyContin CR 10 mg p.o. q.12h.  6. OxyIR one or two tablets every four to six hours p.r.n.   SURGICAL PROCEDURE:  On 01/14/02, the patient was taken to the OR by Dr.  Georgena Moreno, assisted by Vanessa Rushing, PA-C.  Under general anesthesia,  the patient underwent a revision of her  left total knee arthroplasty.  Estimated blood loss was 400 cc.  One Hemovac drain was left in place.  There were no complications and the patient tolerated the procedure well and  transferred to the recovery room in good condition.   CONSULTATIONS:  The following routine consults  requested:  Physical  therapy, occupational therapy, rehab, case management.  Infectious disease  consult was also requested for continued evaluation of the patient's  previous infectious state.   HOSPITAL COURSE:  On 01/14/02, the patient was admitted to [facility] under the care of Dr. Georgena Moreno.  The patient was taken to the  OR.  A revision of her left total knee arthroplasty was performed.  The  patient tolerated the procedure well and was transferred to the recovery  room  and then to the orthopedic floor in good condition.   The patient then incurred a total of three days postoperative care on the  orthopedic floor in which she did very well.  The patient did develop some  postoperative blood loss anemia with her hemoglobin dropping to 8.3.  She  was typed and crossed and transfused two units of packed red blood cells  with positive results.  The patient's temperature remained afebrile.  Her  vitals remained stable. Her wound remained benign for any signs of  infection.  Her leg remained neuro, motor, vascularly intact.  The patient  did do well over the course of her postop stay with physical therapy, but it  was felt that due to her long progression of complications with this knee,  her slow ability to become independent; it was felt that a short stay on the  orthopedic floor was warranted.  Arrangements were made and she was accepted  and transferred to that unit in good condition.   LABORATORY DATA:  CBC on 8/25:  Hemoglobin 8.4, hematocrit 25.5.  Routine  chemistries on 8/24:  Sodium 137, potassium 3.8, glucose 122, BUN 5,  creatinine 0.6.  Routine urinalysis on admission showed   yellow hazy urine,  moderate leukocyte esterase, many epithelial cells, few bacteria.  This was  treated with routine postop antibiotics.   The patient received a total of two units of packed red blood cells.   MEDICATIONS UPON DISCHARGE FROM ORTHO FLOOR:  1. Colace 100 mg  p.o. b.i.d.  2. Lovenox 30 mg subcu q.12h.  3. Synthroid 75 mcg p.o. q.d.  4. Protonix 40 mg p.o. q.d.  5. Wellbutrin 200 mg p.o. b.i.d.  6. Celexa 60 mg p.o. q.d.  7. Oxycodone 20 mg p.o. q.12h.  8. Percocet one to two tablets every four to six hours p.r.n. breakthrough     pain.   DISCHARGE INSTRUCTIONS:  To rehab floor.   MEDICATIONS:  The patient continue routine meds as dispensed on ortho floor.   ACTIVITY:  The patient may be weightbearing as tolerated with the use of a  walker.   DIET:  No restrictions.   WOUND CARE:  The patient should keep wound clean and dry.  Check daily for  any signs of infection. Staples to be removed on postop day #14.    FOLLOW UP:  The patient should have a followup appointment with Dr. Sherlean Moreno in  his office two weeks from date of  discharge from the rehab floor.       [name], P.A.                      [name]. [name], M.D.    RWK/MEDQ  D:  03/29/2002  T:  03/30/2002  Job:  [id number]

## 2010-10-11 NOTE — Discharge Summary (Signed)
[facility]  Patient:    [name], [name] [facility] Visit Number: [id number] MRN: [id number]          Service Type: OUT Location: CARD Attending Physician:  Carolan Shiver Ii Dictated by:   Arnoldo Morale, P.A.-C. Admit Date:  04/08/2001 Discharge Date: 04/08/2001   CC:         [name], M.D., [facility]   Discharge Summary  ADMITTING DIAGNOSES: 1. Severe osteoarthritis, left knee. 2. Obesity. 3. Hypothyroidism. 4. Peptic ulcer disease. 5. History of depression. 6. History of angina with a recent cardiac catheterization. 7. History of guaiac-positive stools.  DISCHARGE DIAGNOSES:  1. End-stage osteoarthritis, left knee.  2. Obesity.  3. Hypothyroidism.  4. Peptic ulcer disease.  5. Acute blood loss anemia secondary to surgery.  6. Hematoma, left knee.  7. Constipation.  8. History of depression.  9. History of angina with a recent cardiac catheterization. 10. History of guaiac-positive stools.  SURGICAL PROCEDURE:  March 29, 2001, [name] underwent a left total knee arthroplasty by Jonny Ruiz L. Dorothyann Gibbs, M.D., assisted by Jerolyn Shin. Tresa Res, M.D.  COMPLICATIONS:  None.  CONSULTS: 1. Rehab medicine consult, March 31, 2001. 2. Physical therapy consult, March 30, 2001. 3. Occupational therapy and case management consult, March 31, 2001.  HISTORY OF PRESENT ILLNESS:  This 65 year old white female patient presented to Dr. Priscille Kluver with a history of bilateral knee pain since the 80s.  She had a right knee replacement done December 2001 with good results, and now her left knee has gotten progressively worse.  The pain in her knee is present with any range of motion.  She has difficulty moving from a sitting to standing positive also initiating ambulation.  The pain is a deep ache in the anteromedial aspect of the joint line with radiation across the knee to the posterolateral aspect.  She has sharp pains at times with  certain movements and has been having difficulty with ambulation.  The pain does keep her up at night.  She has failed conservative treatment at this time and is presenting for a left total knee arthroplasty.  HOSPITAL COURSE:  [name] tolerated her surgical procedure well without immediate postoperative complications.  She was subsequently transferred to [facility] [place].  On postop day one, she was afebrile, vital signs stable.  She had a low urine output, and that was monitored.  Her leg was neurovascularly intact. She was continued on IV fluids at that time and INR was monitored.  On March 31, 2001, she was afebrile, vital signs stable.  She complained of some constipation.  Hemoglobin was 7.1 with hematocrit of 20.7.  She was subsequently transfused with two units of packed red blood cells.  Left knee incision looked good.  She was switched to p.o. pain medications and given a soapsuds enema to help with her complaints of constipation.  On November 7, the patient was feeling much better.  She had good results from the enema.  Afebrile and vital signs stable.  Left knee incision unchanged with the addition of just some moderate ecchymosis.  Hemoglobin had improved to 9.4 with hematocrit of 27.  She was started on regular laxatives to help with constipation and plans were made for discharge home the next day.  On April 02, 2001, she was ready for discharge.  Incision was unchanged from the day before.  She was afebrile and vital signs stable.  She was ready for discharge home after another enema that day.  DISCHARGE  INSTRUCTIONS:  DIET:  She can resume her prehospitalization diet.  MEDICATIONS:  1. She is to presume her prehospitalization medications which include     Wellbutrin SR 150 mg p.o. b.i.d.  2. Celexa 40 mg p.o. q.h.s.  4. Synthroid 200 mcg p.o. q.d.  5. Tamoxifen 10 mg p.o. b.i.d.  6. Prevacid 30 mg p.o. q.d.  7. Celebrex 200 mg p.o. b.i.d.  8. Advair 250/50, 2 puffs  inhaled b.i.d.  9. Sorbitol solution 70% 2 tbsp. b.i.d. 10. Nystatin powder as needed b.i.d. to q.i.d. p.r.n. 11. Albuterol nebulizer p.r.n. 12. Additional medications included ______  2.5 mg subcu q 8 p.m. through     Sunday, and then she needs to start enteric-coated aspirin 325 mg 1 tablet     p.o. q.d. on April 05, 2001. 13. OxyContin 10 mg 1 tablet p.o. q.12h. #22 with no refill. 14. Percocet 5 mg 1-2 tablets p.o. q.4h. p.r.n. for pain #45 with no refill.  ACTIVITY:  She is to be out of bed.  Weightbearing as tolerated on the left leg with the use of a walker.  She is to put a K-pad to her left knee to help with swelling.  She is arranged for home health physical therapy and R.N. per [facility], and a CBC needs to be checked on her on April 05, 2001.  WOUND CARE:  She needs to keep the left knee incision clean and dry.  She can shower once there is no drainage from the wound for two days.  She needs to notify Dr. Priscille Kluver of temperature greater than 101.5, chills, pain unrelieved by pain medications or foul-smelling drainage from the wound.  FOLLOW-UP:  She needs to follow up with Dr. Priscille Kluver in our office on April 13, 2001, and needs to call [phone] to set up that appointment.  She stated good understanding of these instructions and was subsequently discharged to home.  LABORATORY DATA:  On March 24, 2001, white count 3.5, hemoglobin 12, hematocrit 35, and platelets 134.  On November 4, hemoglobin 10.3, hematocrit 29.6.  On November 5, hemoglobin 9, hematocrit 26.  On the 6th, initial hemoglobin was 7.1 with hematocrit 20.7 and repeat was 7.4 with hematocrit of 21.5.  On November 7, hemoglobin 9.4, hematocrit 27.  On November 6, glucose 135, calcium 7.8.  All other laboratory studies were within normal limits. Dictated by:   Arnoldo Morale, P.A.-C. Attending Physician:  Carolan Shiver Ii DD:  04/12/01 TD:  04/12/01 Job: [id number]

## 2010-10-11 NOTE — Discharge Summary (Signed)
[name], [name]                            ACCOUNT NO.:  [id number]   MEDICAL RECORD NO.:  [id number]                    PATIENT TYPE:   LOCATION:                                       FACILITY:   PHYSICIAN:  Krisalyn Yankowski DICTATOR                    DATE OF BIRTH:   DATE OF ADMISSION:  DATE OF DISCHARGE:                                 DISCHARGE SUMMARY   DISCHARGE DIAGNOSES:  1. 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  2. Anemia.  3. Hypothyroidism.  4. 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  5. Peptic ulcer disease.  6. History of depression.  7. History of bronchitis.  8. Right breast cancer with mastectomy and chemotherapy.   HISTORY OF PRESENT ILLNESS:  The patient is a 65 year old white female with  history of right total knee arthroplasty in December 2001 with inpatient  rehabilitative services. She was admitted on January 14, 2002 with noted left  knee arthroplasty on November 2002. Subsequent revision secondary to  loosening of hardware and infection with placement of antibiotic spacers.  She had ongoing low grade temperatures, pain over the joint space and felt  that the joint space was again infected. She was placed on IV Vancomycin and  monitored times six weeks. With increased knee pain, it was advised revision  of left total knee. She underwent revision of left knee on January 14, 2002  by Dr. Sherlean Foot. Placed on subcutaneous Lovenox for deep vein thrombosis  prophylaxis. Advised weight bearing as tolerated. Postoperative anemia 8.3  and transfused two units of packed red blood cells on January 15, 2002.  Infectious disease follow-up by Dr. Burnice Logan felt that Vancomycin could  be stopped but it was requested by the patient to complete two more weeks at  her request, although Dr. Roxan Hockey felt this was not needed. She was  minimal  assist for ambulation and transfers. Moderate assist for activities of daily  living. There was no chest pain or shortness of breath. Latest chemistries  unremarkable. She is admitted for comprehensive rehabilitative program.   PAST MEDICAL HISTORY:  1. Hypothyroidism.  2. Peptic ulcer disease.  3. Depression.  4. History of bronchitis.  5. Right breast cancer with mastectomy and chemotherapy.   PAST SURGICAL HISTORY:  1. Tonsillectomy.  2. Thyroid resection.  3. Back surgery in 1998.  4. Right trigger thumb release in 1993.   ALLERGIES:  PENICILLIN, CODEINE, DEMEROL, AND VALIUM.   PRIMARY CARE PHYSICIAN:  Dewain Penning, M.D.   ONCOLOGIST:  Cephas Darby, M.D.   SOCIAL HISTORY:  The patient quit smoking in 2001. She denies alcohol.   MEDICATION ON ADMISSION:  1. Synthroid.  2. Nexium.  3. Wellbutrin.  4. Celexa.  5.  OxyContin CR as well as immediate release.   FAMILY HISTORY:  Lives with husband in [place]. Independent with cane and  walker prior to admission. Also uses a wheelchair at times. She was  receiving [facility] three [facility] a [facility]. Husband  works full time. Local family with limited assistance.   HOSPITAL COURSE:  The patient did well while on rehabilitative services with  therapy as initiated on a twice a day basis. The following issues were  followed during the patient's rehabilitative course. Pertaining to [name] revision of left knee with history of multiple infections, surgical  site was healing nicely. She was ambulating with walker. Weight bearing as  tolerated. She was close supervision to minimal assist overall with her  gait. Minimum to moderate assist for activities of daily living. Home health  therapy has been arranged for [facility] as prior to hospital  admission. She continued on subcutaneous Lovenox for deep vein thrombosis  prophylaxis. This had been completed at the time of discharge.  She was on IV  Vancomycin, although this had been followed up through Infectious Disease by  Dr. Burnice Logan, who felt that the antibiotic could be discontinued due to  the fact that the patient said that she still had a nine day supply at home.  She had requested to complete this antibiotic therapy. This was all  confirmed with Dr. Burnice Logan. Noted left foot eversion one out of five  for ankle dorsiflexion with decreased sensation to the dorsum of the left  foot as well as left fifth toe and first dorsal web. There was question of  probable peroneal nerve injury. It was the thought of Dr. Jodean Lima that an  EMG should be completed and this would be arranged in his office.  Postoperative anemia was stable with latest hemoglobin 9.3 and hematocrit  27. She had been transfused upon her admit labs to [facility]  rehabilitative unit for hemoglobin of 7.5. She had no chest pain or  shortness of breath. She continued on iron supplement. She would remain on  her hormone replacement for hyperthyroidism. Blood pressures remained  controlled. She had no gastrointestinal complaints on Protonix, which she  would resume Nexium on discharge. She was to be weaned from her OxyContin CR  as she still had a supply at  home. This was all discussed at length. She  had no bowel or bladder disturbances.   LABORATORY DATA:  Hemoglobin and hematocrit 9.3 and 27.7. Sodium 142,  potassium 3.8, BUN 7, creatinine 0.7. LFT's were within normal limits.   DISCHARGE MEDICATIONS:  1. Vancomycin as to be completed prior to hospital admission, directed by     [facility].  2. Synthroid 75 mcg daily.  3. Nexium 40 mg daily.  4. Wellbutrin 200 mg daily.  5. Celexa 60 mg daily.  6. OxyContin CR 20 mg every twelve hours and taper over the next two weeks.  7. Trinsicon one tablet twice a day.  8. Tylenol as needed.  9. Oxycodone 5-10 mg every four hours as needed.  ACTIVITY:  Weight bearing as  tolerated with a walker.   DIET:  Regular.   FOLLOW UP:  Follow-up with Dr. Sherlean Foot in one week. Home health PT and OT as  well as a home health nurse for administering of antibiotics to be  completed. The patient should call for appointment for questionable EMG for  probable peroneal nerve injury per Dr. Loletta Parish  at the [facility] at [phone].                                               Camari Wisham DICTATOR    DD/MEDQ  D:  01/20/2002  T:  01/23/2002  Job:  [id number]   cc:   Erick Colace, M.D.  [address] [place], [place] [address]  Fax: [phone]   Mila Homer. Sherlean Foot, M.D.   Tammy R. Collins Scotland, M.D.

## 2010-10-11 NOTE — Cardiovascular Report (Signed)
[facility]  Patient:    [name], [name]                     MRN: [id number] Proc. Date: 10/30/00 Adm. Date:  [id number] Attending:  Norman Clay CC:         Tammy R. Collins Scotland, M.D.   Cardiac Catheterization  HISTORY:  The patient is a 65 year old female, with progressive exertional dyspnea, chest pain and sweating.  She has a Cardiolite scan that was indeterminate.  PROCEDURES PERFORMED:  Left heart catheterization with coronary angiogram and left ventriculogram.  COMMENTS ABOUT PROCEDURE:  The patient tolerated the procedure well.  Coronary angiograms were done without complications.  A single anterior wall arterial stick was done.  Following the procedure a right ______ angiogram was made and Perclose was attempted to the right femoral artery due to the patients large amount of obesity and platelet count of 115,000.  The initial Perclose device had only deployment of one suture.  This suture was withdrawn and the wire was reintroduced through the device.  A second device was deployed with both sutures being deployed.  Despite what was felt to be significant deployment of the knot, adequate hemostasis could not be achieved.  Manual pressure was held and multiple attempts were made to ensure that the knot was cinched down properly, which it appeared to be.  Despite this, the patient had perfuse arterial bleeding and it was thought that perhaps a suture had become unattached to the opposite arterial wall.  Manual pressure was then held.  The suture was clipped and the patient left the lab with adequate hemostasis.  She will be observed in the hospital overnight following the procedure.  Cipro 200 mg IV was given.  HEMODYNAMIC DATA:  Aorta post contrast 167/96, LV post contrast 167/18-20.  ANGIOGRAPHIC DATA:  LEFT VENTRICULOGRAM:  The left ventriculogram was performed in the 30 degree RAO projection.  The aortic valve was normal.   The mitral valve was normal. The left ventricle appears normal in size.  The estimated ejection fraction is 65-70%.  Coronary arteries arise and distribute normally.  No calcification is present.  Left main:  The left main coronary artery appears normal.  Left anterior descending:  The left anterior descending appears normal.  Circumflex artery:  The circumflex artery appears normal.  Right coronary artery:  The right coronary artery appears normal with no significant coronary artery disease identified.  IMPRESSION: 1. Normal left ventricular function. 2. Normal coronary arteries. 3. Unsuccessful Perclose of the right femoral artery. DD:  10/30/00 TD:  10/30/00 Job: [id number]

## 2010-10-11 NOTE — Op Note (Signed)
[name], [name]                ACCOUNT NO.:  [id number]   MEDICAL RECORD NO.:  [id number]          PATIENT TYPE:  INP   LOCATION:  5013                         FACILITY:  [facility]   PHYSICIAN:  Mila Homer. Sherlean Foot, M.D. DATE OF BIRTH:  [birth year]   DATE OF PROCEDURE:  07/07/2005  DATE OF DISCHARGE:                                 OPERATIVE REPORT   PREOPERATIVE DIAGNOSIS:  Failed right total knee arthroplasty.   POSTOPERATIVE DIAGNOSIS:  Failed right total knee arthroplasty.   OPERATION PERFORMED:  Right revision total knee arthroplasty.   SURGEON:  Mila Homer. Sherlean Foot, M.D.   ASSISTANT:  Richardean Canal, P.A.   ANESTHESIA:  General.   INDICATIONS FOR PROCEDURE:  The patient is a 65 year old white female with  failure of conservative measures for osteoarthritis of the knee.  Total knee  done nine years ago.  She has had two to three years worth of osteolysis and  pain and has now finally given informed consent for do revision surgery.  Informed consent was obtained.   DESCRIPTION OF PROCEDURE:  The patient was laid supine and administered  general anesthesia.  Foley catheter placed and right lower extremity was  prepped and draped in the usual sterile fashion.  Exsanguination of the  extremity was performed with an Esmarch and tourniquet inflated to 250 mmHg.  A #10 blade was used to make an incision through the old incision.  I  extended it proximally and distally approximately 2 more cm for a total  length of 16.  Used a fresh blade to go through the old medial parapatellar  arthrotomy to remove the Tycron sutures.  There was lots of synovitis.  Complete synovectomy was performed with a #10 blade.  Then I sent off some  of that synovium for frozen section and the pathologist called back with no  acute inflammation.  Both components were grossly loose.  I used the small  sagittal saw to get an interval between the femoral and tibial components  and the cement. I removed both just  with finger pressure.  Then removed the  cement and debris from the end of the femur with the curette and rongeurs.  The same on the tibia.  I then irrigated copiously.  I then removed the  metal backed patella.  There was minimal bone loss there.  The bone loss on  the femur was contained but was approximately 30 mL of cancellous bone that  was very very osteoporotic bone and at least 5 mm to 10 mm of bone loss from  the distal aspect of the lateral femoral condyle.  On the tibia, the tibial  component was basically inside of the tibia and metaphyseal region and had  subsided down approximately 1 to 1.5 cm.  I removed that and again debrided  back to a stable rim of bony tissue.  I then sized the femur with a sagittal  sizer to a size E.  I put on a 5 distal femoral augment 10 mm medial distal  augment and tamped it down with an offset femoral stem which was size 16.  This afforded excellent coverage distally.  I used the offset stem to bring  the component a little bit posteriorly to have  good contact anteriorly.  I  then pinned this and cut the box.  With the trial component in place, I  actually repaired the femur quite nicely.  I then turned my attention to the  tibia.  The knee was hyperflexed and I used the 12 mm reamer and set up a  perpendicular cut going down to the level of good cancellous bone.  I then  used a size 3 tibial tray, drill and keel and a size 16 offset stem to bring  the tibial a little bit anteriorly on the cut surface of the femur.  I then  trialed with a size 3 tibia, size 16 offset insert, size E femur with 5 mm  distal lateral, 10 mm distal medial and 16 mm offset femoral stem.  After  good flexion and extension gap balance I chose to go with a CCK 17 mm  polyethylene insert only because the LPS had a little bit of rotational  instability at midflexion.  It had good patellar tracking here as well.  I  removed all the trial components.  I then copiously  irrigated.  I then  cemented in the tibia.  I removed excess cement.  I then cemented in the  femur and placed the 17 mm polyethylene in and screwed it and locked it into  place.  I then cemented in the patella.  I allowed the cement to harden.  I  let the tourniquet down which was at 1 hour and 47 minutes.  I then  copiously irrigated again.  I left the Hemovac deep to the arthrotomy coming  out superolaterally.  I closed the arthrotomy with figure-of-eight #1 Vicryl  sutures.  Deep soft tissue with interrupted 0 Vicryl sutures, subcuticular 2-  0 Vicryl stitches and skin staples.   COMPLICATIONS:  None.   DRAINS:  One Hemovac.   ESTIMATED BLOOD LOSS:  500 mL.           ______________________________  Mila Homer. Sherlean Foot, M.D.     SDL/MEDQ  D:  07/07/2005  T:  07/07/2005  Job:  [id number]

## 2011-02-14 LAB — BASIC METABOLIC PANEL WITH GFR
BUN: 15
CO2: 28
Calcium: 9.2
Chloride: 96
Creatinine, Ser: 0.95
GFR calc non Af Amer: 60 — ABNORMAL LOW
Glucose, Bld: 214 — ABNORMAL HIGH
Potassium: 3.8
Sodium: 134 — ABNORMAL LOW

## 2011-02-14 LAB — DIFFERENTIAL
Basophils Absolute: 0
Basophils Relative: 0
Eosinophils Absolute: 0
Eosinophils Relative: 0
Lymphocytes Relative: 9 — ABNORMAL LOW
Lymphs Abs: 0.7
Monocytes Absolute: 0.1
Monocytes Relative: 1 — ABNORMAL LOW
Neutro Abs: 6.8
Neutrophils Relative %: 89 — ABNORMAL HIGH

## 2011-02-14 LAB — CBC
HCT: 39.4
Hemoglobin: 13.5
WBC: 7.7

## 2011-02-21 ENCOUNTER — Ambulatory Visit (INDEPENDENT_AMBULATORY_CARE_PROVIDER_SITE_OTHER): Payer: Medicare Other | Admitting: General Surgery

## 2011-02-21 VITALS — BP 142/88 | HR 70 | Temp 97.3°F | Resp 16 | Ht 66.0 in | Wt 272.2 lb

## 2011-02-21 DIAGNOSIS — R140 Abdominal distension (gaseous): Secondary | ICD-10-CM

## 2011-02-21 DIAGNOSIS — R141 Gas pain: Secondary | ICD-10-CM

## 2011-02-21 DIAGNOSIS — R143 Flatulence: Secondary | ICD-10-CM

## 2011-02-21 NOTE — Patient Instructions (Signed)
Call for any worsening symptoms and we will do your breast cancer checkup in November.

## 2011-02-21 NOTE — Progress Notes (Signed)
Chief complaint: Abdominal bloating and discomfort  History: Patient returns to the office with some abdominal complaints. She is status post laparoscopic cholecystectomy in January of this year for some episodic right upper quadrant pain and negative gallbladder ultrasound and occasionally mildly elevated LFTs. This pain was relieved by surgery and has remained relieved. She however in recent weeks to a couple of months has felt pain and bloating in her midline upper abdomen a little above her supraumbilical port site. This radiates up into her epigastrium. It tends to get much worse prior to bowel movements and then better with flatus. She however has this discomfort at other times as well and tenderness when this air of her abdomen bumps into a counter or chair. Of note she has a long history of abdominal and GI complaints followed by Dr. Kinnie Scales and has had a recent colonoscopy and has had a CT of the abdomen and pelvis within the past year.  Physical exam: Gen.: Significant for morbid obesity. Abdomen: Well-healed laparoscopic incisions. I cannot feel any evidence of incisional hernia with her coughing straining. There is no significant abdominal tenderness. No palpable mass.  Assessment and plan: Abdominal discomfort and bloating. This sounds quite similar to her chronic complaints and I expect may be functional and related to gaseous distention of her colon or bowel. I do not find any incisional hernia or evidence of complication from her gallbladder surgery. She has had extensive workup in the recent past and I doubt that she has a surgical problem. I told her that I think symptomatic management for now is appropriate. She has a breast cancer followup scheduled for later this year and we can recheck this issue at that time as well.

## 2011-04-10 ENCOUNTER — Ambulatory Visit (INDEPENDENT_AMBULATORY_CARE_PROVIDER_SITE_OTHER): Payer: Medicare Other | Admitting: General Surgery

## 2011-04-10 ENCOUNTER — Encounter (INDEPENDENT_AMBULATORY_CARE_PROVIDER_SITE_OTHER): Payer: Self-pay | Admitting: General Surgery

## 2011-04-10 VITALS — BP 128/76 | HR 80 | Temp 97.3°F | Resp 18 | Ht 65.5 in | Wt 269.0 lb

## 2011-04-10 DIAGNOSIS — K432 Incisional hernia without obstruction or gangrene: Secondary | ICD-10-CM

## 2011-04-10 HISTORY — DX: Morbid (severe) obesity due to excess calories: E66.01

## 2011-04-10 NOTE — Patient Instructions (Signed)
Hernia Repair with Laparoscope A hernia occurs when an internal organ pushes out through a weak spot in the belly (abdominal) wall muscles. Hernias most commonly occur in the groin and around the navel. Hernias can also occur through a cut by the surgeon (incision) after an abdominal operation. A hernia may be caused by:  Lifting heavy objects.   Prolonged coughing.   Straining to move your bowels.  Hernias can often be pushed back into place (reduced). Most hernias tend to get worse over time. Problems occur when abdominal contents get stuck in the opening and the blood supply is blocked or impaired (incarcerated hernia). Because of these risks, you require surgery to repair the hernia. Your hernia will be repaired using a laparoscope. Laparoscopic surgery is a type of minimally invasive surgery. It does not involve making a typical surgical cut (incision) in the skin. A laparoscope is a telescope-like rod and lens system. It is usually connected to a video camera and a light source so your caregiver can clearly see the operative area. The instruments are inserted through  to  inch (5 mm or 10 mm) openings in the skin at specific locations. A working and viewing space is created by blowing a small amount of carbon dioxide gas into the abdominal cavity. The abdomen is essentially blown up like a balloon (insufflated). This elevates the abdominal wall above the internal organs like a dome. The carbon dioxide gas is common to the human body and can be absorbed by tissue and removed by the respiratory system. Once the repair is completed, the small incisions will be closed with either stitches (sutures) or staples (just like a paper stapler only this staple holds the skin together). LET YOUR CAREGIVERS KNOW ABOUT:  Allergies.   Medications taken including herbs, eye drops, over the counter medications, and creams.   Use of steroids (by mouth or creams).   Previous problems with anesthetics or  Novocaine.   Possibility of pregnancy, if this applies.   History of blood clots (thrombophlebitis).   History of bleeding or blood problems.   Previous surgery.   Other health problems.  BEFORE THE PROCEDURE  Laparoscopy can be done either in a hospital or out-patient clinic. You may be given a mild sedative to help you relax before the procedure. Once in the operating room, you will be given a general anesthesia to make you sleep (unless you and your caregiver choose a different anesthetic).  AFTER THE PROCEDURE  After the procedure you will be watched in a recovery area. Depending on what type of hernia was repaired, you might be admitted to the hospital or you might go home the same day. With this procedure you may have less pain and scarring. This usually results in a quicker recovery and less risk of infection. HOME CARE INSTRUCTIONS   Bed rest is not required. You may continue your normal activities but avoid heavy lifting (more than 10 pounds) or straining.   Cough gently. If you are a smoker it is best to stop, as even the best hernia repair can break down with the continual strain of coughing.   Avoid driving until given the OK by your surgeon.   There are no dietary restrictions unless given otherwise.   TAKE ALL MEDICATIONS AS DIRECTED.   Only take over-the-counter or prescription medicines for pain, discomfort, or fever as directed by your caregiver.  SEEK MEDICAL CARE IF:   There is increasing abdominal pain or pain in your incisions.     There is more bleeding from incisions, other than minimal spotting.   You feel light headed or faint.   You develop an unexplained fever, chills, and/or an oral temperature above 102 F (38.9 C).   You have redness, swelling, or increasing pain in the wound.   Pus coming from wound.   A foul smell coming from the wound or dressings.  SEEK IMMEDIATE MEDICAL CARE IF:   You develop a rash.   You have difficulty breathing.    You have any allergic problems.  MAKE SURE YOU:   Understand these instructions.   Will watch your condition.   Will get help right away if you are not doing well or get worse.  Document Released: 05/12/2005 Document Revised: 01/22/2011 Document Reviewed: 04/11/2009 [employer] Patient Information 2012 [employer]. 

## 2011-04-10 NOTE — Progress Notes (Signed)
Subjective:   Lump and pain in abdomen  Patient ID: [name], female   DOB: [birth year], 64 y.o.   MRN: [id number]  HPI Patient returns to the office with a new complaints. She is status post laparoscopic cholecystectomy with a supraumbilical incision in January 2012. She states that last night she had a fairly severe pain near this incision and also vomiting. She has recently noted a lump soreness and tenderness in this area as well. No vomiting today.  Past Medical History  Diagnosis Date  . Cancer   . Asthma   . Blood transfusion   . GERD (gastroesophageal reflux disease)   . Thyroid disease   . Allergy    Past Surgical History  Procedure Date  . Breast surgery   . Breast lumpectomy   . Mastectomy 1997    right breast  . Cholecystectomy   . Knee surgery     x5  . Joint replacement     right knee x2  . Joint replacement     left knee x5  . Back surgery   . Foot surgery     fused left foot  . Tonsillectomy 68  . Thyroid surgery   . Hand tendon surgery     right hand tendon tear  . Rotator cuff repair     right rotator cuff  . Thyroidectomy    Current Outpatient Prescriptions  Medication Sig Dispense Refill  . buPROPion (WELLBUTRIN XL) 300 MG 24 hr tablet Take 300 mg by mouth daily.        . celecoxib (CELEBREX) 200 MG capsule Take 200 mg by mouth daily.        . citalopram (CELEXA) 40 MG tablet Take 40 mg by mouth daily.        Marland Kitchen esomeprazole (NEXIUM) 40 MG capsule Take 40 mg by mouth daily before breakfast.        . Fluticasone-Salmeterol (ADVAIR) 100-50 MCG/DOSE AEPB Inhale 1 puff into the lungs as needed.        . furosemide (LASIX) 40 MG tablet Take 40 mg by mouth daily.        Marland Kitchen levothyroxine (SYNTHROID, LEVOTHROID) 200 MCG tablet Take 200 mcg by mouth daily.        . Multiple Vitamins-Minerals (PRESERVISION/LUTEIN PO) Take by mouth.         Allergies  Allergen Reactions  . Diazepam   . Penicillins    History  Substance Use Topics  . Smoking status:  Former Games developer  . Smokeless tobacco: Never Used  . Alcohol Use: No    Review of Systems  Constitutional: Positive for fatigue.  HENT: Negative.   Respiratory: Negative.   Cardiovascular: Negative.   Gastrointestinal: Positive for nausea, abdominal pain and abdominal distention.  Musculoskeletal: Positive for arthralgias and gait problem.       Objective:   Physical Exam General: Morbidly obese Caucasian female in no distress Skin: Warm and dry without rash or infection HEENT: No palpable masses or thyromegaly. Sclera nonicteric. Lymph nodes: No cervical, subclavicular, or axillary nodes palpable Breasts: Status post right mastectomy with no chest wall masses or skin changes. Left breast is negative for masses Lungs: Clear without wheezing or increased work of breathing Cardiovascular: Regular rate and rhythm without murmur. Trace edema. No JVD. Abdomen: Obese. Intermittent abdomen above the umbilicus at the site of her previous laparoscopic port site is a definite several centimeter tender reducible hernia. With the patient doing so that maneuver there is some moderate stasis  as well. No other masses or tenderness. Exam limited due to obesity. Extremities: One plus edema Neurologic: Alert and oriented. Gait normal.    Assessment:     Incisional hernia at previous midabdominal laparoscopic site. This is markedly symptomatic and will need to be repaired. I discussed this with her. We discussed the nature of the procedure as well as complications such as general anesthesia risks bleeding, infection, trocar injury. We discussed use of mesh and the risk of recurrence. Her recurrence risk will be higher due to morbid obesity. She also has some diastases in the in this area in one to use a fairly large piece of mesh. Recovery time was discussed. All questions were answered.    Plan:     Laparoscopic repair of abdominal incisional hernia under general anesthesia and plan overnight  hospitalization

## 2011-04-12 ENCOUNTER — Telehealth: Payer: Self-pay | Admitting: Oncology

## 2011-04-12 NOTE — Telephone Encounter (Signed)
Talked to reminded her of appt on 04/18/11,pt is aware of appt scheduled for 05/30/11, r/s from November due to [facility]

## 2011-04-18 ENCOUNTER — Other Ambulatory Visit: Payer: Self-pay | Admitting: Oncology

## 2011-04-18 ENCOUNTER — Other Ambulatory Visit (HOSPITAL_BASED_OUTPATIENT_CLINIC_OR_DEPARTMENT_OTHER): Payer: Medicare Other | Admitting: Lab

## 2011-04-18 DIAGNOSIS — Z853 Personal history of malignant neoplasm of breast: Secondary | ICD-10-CM

## 2011-04-18 DIAGNOSIS — R7401 Elevation of levels of liver transaminase levels: Secondary | ICD-10-CM

## 2011-04-18 LAB — CBC WITH DIFFERENTIAL/PLATELET
BASO%: 0.4 % (ref 0.0–2.0)
EOS%: 1.8 % (ref 0.0–7.0)
HCT: 36.5 % (ref 34.8–46.6)
LYMPH%: 24.2 % (ref 14.0–49.7)
MCH: 29.6 pg (ref 25.1–34.0)
MCHC: 33.9 g/dL (ref 31.5–36.0)
MONO#: 0.2 10*3/uL (ref 0.1–0.9)
NEUT%: 68.1 % (ref 38.4–76.8)
Platelets: 138 10*3/uL — ABNORMAL LOW (ref 145–400)

## 2011-04-18 LAB — COMPREHENSIVE METABOLIC PANEL
ALT: 30 U/L (ref 0–35)
BUN: 13 mg/dL (ref 6–23)
CO2: 24 mEq/L (ref 19–32)
Creatinine, Ser: 0.56 mg/dL (ref 0.50–1.10)
Total Bilirubin: 0.2 mg/dL — ABNORMAL LOW (ref 0.3–1.2)

## 2011-04-18 LAB — LACTATE DEHYDROGENASE: LDH: 186 U/L (ref 94–250)

## 2011-04-22 ENCOUNTER — Encounter (INDEPENDENT_AMBULATORY_CARE_PROVIDER_SITE_OTHER): Payer: Self-pay | Admitting: Surgery

## 2011-04-22 ENCOUNTER — Ambulatory Visit (INDEPENDENT_AMBULATORY_CARE_PROVIDER_SITE_OTHER): Payer: Medicare Other | Admitting: Surgery

## 2011-04-22 VITALS — BP 116/88 | HR 68 | Temp 97.5°F | Resp 16 | Ht 65.5 in | Wt 264.1 lb

## 2011-04-22 DIAGNOSIS — L02219 Cutaneous abscess of trunk, unspecified: Secondary | ICD-10-CM

## 2011-04-22 DIAGNOSIS — L03313 Cellulitis of chest wall: Secondary | ICD-10-CM

## 2011-04-22 DIAGNOSIS — L03319 Cellulitis of trunk, unspecified: Secondary | ICD-10-CM

## 2011-04-22 MED ORDER — DOXYCYCLINE HYCLATE 100 MG PO TABS: 100.0000 mg | ORAL_TABLET | Freq: Two times a day (BID) | ORAL | Status: AC

## 2011-04-22 NOTE — Progress Notes (Signed)
Subjective:     Patient ID: IMAAN Moreno, female   DOB: January 05, 1946, 65 y.o.   MRN: [id number]  HPI The patient presents today with a chief complaint of redness over her previous right mastectomy incision. She also has noted some drainage. She is complaining of pain at that site as well. The pain is sharp in nature. She denies any fever or chills. The drainage is purulent. This has been going on for at least one week.   Review of Systems  Constitutional: Positive for fatigue. Negative for fever and chills.  HENT: Negative.   Respiratory: Negative.   Cardiovascular: Negative.        Objective:   Physical Exam  HENT:  Head: Normocephalic and atraumatic.  Pulmonary/Chest: Effort normal.  Skin:     Redness central portion of mastectomy incision.no abscess.  No drainage.     Assessment:     Cellulitis right chest wall in mastectomy incision.    Plan:     Doxycycline 100 mg p.o. b.i.d. Cellulitis home care instructions given. Followup 10-14 days with Dr. Johna Moreno

## 2011-04-22 NOTE — Patient Instructions (Signed)

## 2011-04-24 ENCOUNTER — Ambulatory Visit
Admission: RE | Admit: 2011-04-24 | Discharge: 2011-04-24 | Disposition: A | Discharge status: Home / Self Care | Payer: Medicare Other | Source: Ambulatory Visit | Attending: Oncology | Admitting: Oncology

## 2011-04-24 ENCOUNTER — Encounter (INDEPENDENT_AMBULATORY_CARE_PROVIDER_SITE_OTHER): Payer: Medicare Other | Admitting: General Surgery

## 2011-04-24 DIAGNOSIS — Z9011 Acquired absence of right breast and nipple: Secondary | ICD-10-CM

## 2011-04-24 DIAGNOSIS — Z9889 Other specified postprocedural states: Secondary | ICD-10-CM

## 2011-05-05 ENCOUNTER — Emergency Department (HOSPITAL_COMMUNITY): Payer: Medicare Other | Admitting: Anesthesiology

## 2011-05-05 ENCOUNTER — Encounter (HOSPITAL_COMMUNITY): Payer: Self-pay | Admitting: Emergency Medicine

## 2011-05-05 ENCOUNTER — Inpatient Hospital Stay (HOSPITAL_COMMUNITY)
Admission: EM | Admit: 2011-05-05 | Discharge: 2011-05-10 | DRG: 354 | Disposition: A | Discharge status: Home / Self Care | Payer: Medicare Other | Attending: Surgery | Admitting: Surgery

## 2011-05-05 ENCOUNTER — Other Ambulatory Visit: Payer: Self-pay

## 2011-05-05 ENCOUNTER — Emergency Department (HOSPITAL_COMMUNITY): Payer: Medicare Other

## 2011-05-05 ENCOUNTER — Encounter (HOSPITAL_COMMUNITY): Payer: Self-pay | Admitting: Anesthesiology

## 2011-05-05 ENCOUNTER — Encounter (HOSPITAL_COMMUNITY): Admission: EM | Disposition: A | Discharge status: Home / Self Care | Payer: Self-pay | Source: Home / Self Care

## 2011-05-05 ENCOUNTER — Telehealth (INDEPENDENT_AMBULATORY_CARE_PROVIDER_SITE_OTHER): Payer: Self-pay | Admitting: General Surgery

## 2011-05-05 DIAGNOSIS — K560 Paralytic ileus: Secondary | ICD-10-CM | POA: Diagnosis not present

## 2011-05-05 DIAGNOSIS — R112 Nausea with vomiting, unspecified: Secondary | ICD-10-CM | POA: Diagnosis present

## 2011-05-05 DIAGNOSIS — E875 Hyperkalemia: Secondary | ICD-10-CM | POA: Diagnosis present

## 2011-05-05 DIAGNOSIS — K219 Gastro-esophageal reflux disease without esophagitis: Secondary | ICD-10-CM | POA: Diagnosis present

## 2011-05-05 DIAGNOSIS — R109 Unspecified abdominal pain: Secondary | ICD-10-CM

## 2011-05-05 DIAGNOSIS — K430 Incisional hernia with obstruction, without gangrene: Secondary | ICD-10-CM

## 2011-05-05 DIAGNOSIS — Z6841 Body Mass Index (BMI) 40.0 and over, adult: Secondary | ICD-10-CM

## 2011-05-05 DIAGNOSIS — K56609 Unspecified intestinal obstruction, unspecified as to partial versus complete obstruction: Secondary | ICD-10-CM | POA: Diagnosis present

## 2011-05-05 HISTORY — PX: INCISIONAL HERNIA REPAIR: SHX193

## 2011-05-05 HISTORY — DX: Other specified postprocedural states: R11.2

## 2011-05-05 HISTORY — DX: Other specified postprocedural states: Z98.890

## 2011-05-05 LAB — COMPREHENSIVE METABOLIC PANEL
Alkaline Phosphatase: 69 U/L (ref 39–117)
BUN: 12 mg/dL (ref 6–23)
CO2: 27 mEq/L (ref 19–32)
Chloride: 101 mEq/L (ref 96–112)
Creatinine, Ser: 0.6 mg/dL (ref 0.50–1.10)
GFR calc non Af Amer: >90 mL/min (ref >90)
Glucose, Bld: 134 mg/dL — ABNORMAL HIGH (ref 70–99)
Potassium: 3.9 mEq/L (ref 3.5–5.1)
Total Bilirubin: 0.4 mg/dL (ref 0.3–1.2)

## 2011-05-05 LAB — URINALYSIS, ROUTINE W REFLEX MICROSCOPIC
Bilirubin Urine: NEGATIVE
Ketones, ur: NEGATIVE mg/dL
Nitrite: NEGATIVE
Protein, ur: NEGATIVE mg/dL

## 2011-05-05 LAB — DIFFERENTIAL
Lymphocytes Relative: 13 % (ref 12–46)
Lymphs Abs: 0.9 10*3/uL (ref 0.7–4.0)
Monocytes Absolute: 0.3 10*3/uL (ref 0.1–1.0)
Monocytes Relative: 5 % (ref 3–12)
Neutro Abs: 5.2 10*3/uL (ref 1.7–7.7)

## 2011-05-05 LAB — URINE MICROSCOPIC-ADD ON

## 2011-05-05 LAB — CBC
HCT: 37.9 % (ref 36.0–46.0)
Hemoglobin: 12.7 g/dL (ref 12.0–15.0)
MCHC: 33.5 g/dL (ref 30.0–36.0)
RBC: 4.43 MIL/uL (ref 3.87–5.11)
WBC: 6.4 10*3/uL (ref 4.0–10.5)

## 2011-05-05 LAB — LIPASE, BLOOD: Lipase: 13 U/L (ref 11–59)

## 2011-05-05 SURGERY — REPAIR, HERNIA, INCISIONAL, LAPAROSCOPIC
Anesthesia: General | Site: Abdomen | Wound class: Clean Contaminated

## 2011-05-05 MED ORDER — HYDROMORPHONE HCL PF 1 MG/ML IJ SOLN: 0.2500 mg | INTRAMUSCULAR | Status: DC | PRN

## 2011-05-05 MED ORDER — ONDANSETRON HCL 4 MG/2ML IJ SOLN
INTRAMUSCULAR | Status: DC | PRN
  Administered 2011-05-05: 4 mg via INTRAVENOUS

## 2011-05-05 MED ORDER — PROPOFOL 10 MG/ML IV BOLUS
INTRAVENOUS | Status: DC | PRN
  Administered 2011-05-05: 150 mg via INTRAVENOUS
  Administered 2011-05-05: 25 mg via INTRAVENOUS

## 2011-05-05 MED ORDER — DIPHENHYDRAMINE HCL 12.5 MG/5ML PO ELIX: 12.5000 mg | ORAL_SOLUTION | Freq: Four times a day (QID) | ORAL | Status: DC | PRN

## 2011-05-05 MED ORDER — ONDANSETRON HCL 4 MG/2ML IJ SOLN
4.0000 mg | Freq: Once | INTRAMUSCULAR | Status: AC
  Administered 2011-05-05: 4 mg via INTRAVENOUS
  Filled 2011-05-05: qty 2

## 2011-05-05 MED ORDER — IOHEXOL 300 MG/ML  SOLN
100.0000 mL | Freq: Once | INTRAMUSCULAR | Status: AC | PRN
  Administered 2011-05-05: 100 mL via INTRAVENOUS

## 2011-05-05 MED ORDER — DEXAMETHASONE SODIUM PHOSPHATE 4 MG/ML IJ SOLN
INTRAMUSCULAR | Status: DC | PRN
  Administered 2011-05-05: 10 mg via INTRAVENOUS

## 2011-05-05 MED ORDER — LACTATED RINGERS IV SOLN: INTRAVENOUS | Status: DC

## 2011-05-05 MED ORDER — GLYCOPYRROLATE 0.2 MG/ML IJ SOLN
INTRAMUSCULAR | Status: DC | PRN
  Administered 2011-05-05: .4 mg via INTRAVENOUS

## 2011-05-05 MED ORDER — PANTOPRAZOLE SODIUM 40 MG IV SOLR
40.0000 mg | Freq: Every day | INTRAVENOUS | Status: DC
  Administered 2011-05-06 – 2011-05-09 (×4): 40 mg via INTRAVENOUS
  Filled 2011-05-05 (×5): qty 40

## 2011-05-05 MED ORDER — LACTATED RINGERS IR SOLN
Status: DC | PRN
  Administered 2011-05-05: 1000 mL

## 2011-05-05 MED ORDER — CISATRACURIUM BESYLATE 2 MG/ML IV SOLN
INTRAVENOUS | Status: DC | PRN
  Administered 2011-05-05: 4 mg via INTRAVENOUS
  Administered 2011-05-05: 2 mg via INTRAVENOUS
  Administered 2011-05-05: 6 mg via INTRAVENOUS

## 2011-05-05 MED ORDER — DIPHENHYDRAMINE HCL 50 MG/ML IJ SOLN: 12.5000 mg | Freq: Four times a day (QID) | INTRAMUSCULAR | Status: DC | PRN

## 2011-05-05 MED ORDER — CIPROFLOXACIN IN D5W 400 MG/200ML IV SOLN
400.0000 mg | Freq: Once | INTRAVENOUS | Status: AC
  Administered 2011-05-05: 400 mg via INTRAVENOUS
  Filled 2011-05-05: qty 200

## 2011-05-05 MED ORDER — SODIUM CHLORIDE 0.9 % IR SOLN
Status: DC | PRN
  Administered 2011-05-05: 2000 mL

## 2011-05-05 MED ORDER — HYDROMORPHONE HCL PF 1 MG/ML IJ SOLN
1.0000 mg | Freq: Once | INTRAMUSCULAR | Status: AC
  Administered 2011-05-05: 1 mg via INTRAVENOUS
  Filled 2011-05-05: qty 1

## 2011-05-05 MED ORDER — LACTATED RINGERS IV SOLN
INTRAVENOUS | Status: DC
  Administered 2011-05-05: 22:00:00 via INTRAVENOUS

## 2011-05-05 MED ORDER — SODIUM CHLORIDE 0.9 % IV SOLN
999.0000 mL | Freq: Once | INTRAVENOUS | Status: AC
  Administered 2011-05-05: 999 mL via INTRAVENOUS

## 2011-05-05 MED ORDER — NEOSTIGMINE METHYLSULFATE 1 MG/ML IJ SOLN
INTRAMUSCULAR | Status: DC | PRN
  Administered 2011-05-05: 2 mg via INTRAVENOUS

## 2011-05-05 MED ORDER — PROMETHAZINE HCL 25 MG/ML IJ SOLN: 6.2500 mg | INTRAMUSCULAR | Status: DC | PRN

## 2011-05-05 MED ORDER — KCL IN DEXTROSE-NACL 20-5-0.45 MEQ/L-%-% IV SOLN
INTRAVENOUS | Status: DC
  Administered 2011-05-05 – 2011-05-10 (×12): via INTRAVENOUS
  Filled 2011-05-05 (×13): qty 1000

## 2011-05-05 MED ORDER — MORPHINE SULFATE 10 MG/ML IJ SOLN
INTRAMUSCULAR | Status: AC
  Administered 2011-05-05: 4 mg
  Filled 2011-05-05: qty 1

## 2011-05-05 MED ORDER — MORPHINE SULFATE 4 MG/ML IJ SOLN
4.0000 mg | Freq: Once | INTRAMUSCULAR | Status: DC
  Filled 2011-05-05: qty 1

## 2011-05-05 MED ORDER — SUCCINYLCHOLINE CHLORIDE 20 MG/ML IJ SOLN
INTRAMUSCULAR | Status: DC | PRN
  Administered 2011-05-05: 100 mg via INTRAVENOUS

## 2011-05-05 MED ORDER — HYDROMORPHONE HCL PF 1 MG/ML IJ SOLN
1.0000 mg | INTRAMUSCULAR | Status: DC | PRN
  Administered 2011-05-05 – 2011-05-06 (×4): 2 mg via INTRAVENOUS
  Administered 2011-05-07 – 2011-05-09 (×4): 1 mg via INTRAVENOUS
  Administered 2011-05-09: 2 mg via INTRAVENOUS
  Administered 2011-05-10: 1 mg via INTRAVENOUS
  Filled 2011-05-05 (×2): qty 2
  Filled 2011-05-05 (×4): qty 1
  Filled 2011-05-05 (×2): qty 2
  Filled 2011-05-05: qty 1
  Filled 2011-05-05: qty 2

## 2011-05-05 MED ORDER — BUPIVACAINE-EPINEPHRINE PF 0.25-1:200000 % IJ SOLN
INTRAMUSCULAR | Status: DC | PRN
  Administered 2011-05-05: 20 mL

## 2011-05-05 MED ORDER — FENTANYL CITRATE 0.05 MG/ML IJ SOLN
INTRAMUSCULAR | Status: DC | PRN
  Administered 2011-05-05: 50 ug via INTRAVENOUS
  Administered 2011-05-05: 100 ug via INTRAVENOUS
  Administered 2011-05-05 (×2): 25 ug via INTRAVENOUS

## 2011-05-05 MED ORDER — LACTATED RINGERS IV SOLN
INTRAVENOUS | Status: DC | PRN
  Administered 2011-05-05 (×2): via INTRAVENOUS

## 2011-05-05 MED ORDER — ONDANSETRON HCL 4 MG/2ML IJ SOLN
4.0000 mg | Freq: Four times a day (QID) | INTRAMUSCULAR | Status: DC | PRN
  Administered 2011-05-06 – 2011-05-07 (×2): 4 mg via INTRAVENOUS
  Filled 2011-05-05 (×2): qty 2

## 2011-05-05 MED ORDER — DIPHENHYDRAMINE HCL 50 MG/ML IJ SOLN
25.0000 mg | Freq: Once | INTRAMUSCULAR | Status: AC
  Administered 2011-05-05: 25 mg via INTRAVENOUS
  Filled 2011-05-05: qty 1

## 2011-05-05 MED ORDER — BUPIVACAINE-EPINEPHRINE PF 0.25-1:200000 % IJ SOLN
INTRAMUSCULAR | Status: AC
  Filled 2011-05-05: qty 30

## 2011-05-05 SURGICAL SUPPLY — 70 items
ADH SKN CLS APL DERMABOND .7 (GAUZE/BANDAGES/DRESSINGS) ×2
APL SKNCLS STERI-STRIP NONHPOA (GAUZE/BANDAGES/DRESSINGS)
BENZOIN TINCTURE PRP APPL 2/3 (GAUZE/BANDAGES/DRESSINGS) IMPLANT
BINDER ABD UNIV 12 45-62 (WOUND CARE) ×2 IMPLANT
BINDER ABDOMINAL 46IN 62IN (WOUND CARE) ×3
CANISTER SUCTION 2500CC (MISCELLANEOUS) ×3 IMPLANT
CLOTH BEACON ORANGE TIMEOUT ST (SAFETY) ×3 IMPLANT
DECANTER SPIKE VIAL GLASS SM (MISCELLANEOUS) ×3 IMPLANT
DERMABOND ADVANCED (GAUZE/BANDAGES/DRESSINGS) ×1
DERMABOND ADVANCED .7 DNX12 (GAUZE/BANDAGES/DRESSINGS) ×1 IMPLANT
DEVICE SECURE STRAP 25 ABSORB (INSTRUMENTS) ×4 IMPLANT
DEVICE TROCAR PUNCTURE CLOSURE (ENDOMECHANICALS) ×2 IMPLANT
DISSECTOR BLUNT TIP ENDO 5MM (MISCELLANEOUS) IMPLANT
DRAIN CHANNEL 19F RND (DRAIN) ×1 IMPLANT
DRAPE CAMERA CLOSED 9X96 (DRAPES) ×2 IMPLANT
DRAPE INCISE IOBAN 66X45 STRL (DRAPES) ×4 IMPLANT
DRAPE INCISE IOBAN 85X60 (DRAPES) ×2 IMPLANT
DRAPE LAPAROSCOPIC ABDOMINAL (DRAPES) ×4 IMPLANT
DRSG PAD ABDOMINAL 8X10 ST (GAUZE/BANDAGES/DRESSINGS) ×8 IMPLANT
ELECT REM PT RETURN 9FT ADLT (ELECTROSURGICAL) ×3
ELECTRODE REM PT RTRN 9FT ADLT (ELECTROSURGICAL) ×3 IMPLANT
GLOVE BIOGEL PI IND STRL 6.5 (GLOVE) ×2 IMPLANT
GLOVE BIOGEL PI IND STRL 7.0 (GLOVE) ×4 IMPLANT
GLOVE BIOGEL PI INDICATOR 6.5 (GLOVE) ×2
GLOVE BIOGEL PI INDICATOR 7.0 (GLOVE) ×2
GLOVE SS BIOGEL STRL SZ 7.5 (GLOVE) ×2 IMPLANT
GLOVE SUPERSENSE BIOGEL SZ 7.5 (GLOVE) ×1
GLOVE SURG SS PI 6.5 STRL IVOR (GLOVE) ×6 IMPLANT
GOWN BRE IMP SLV AUR XL STRL (GOWN DISPOSABLE) ×1 IMPLANT
GOWN STRL NON-REIN LRG LVL3 (GOWN DISPOSABLE) ×9 IMPLANT
GOWN STRL REIN XL XLG (GOWN DISPOSABLE) ×8 IMPLANT
KIT BASIN OR (CUSTOM PROCEDURE TRAY) ×4 IMPLANT
MESH PARIETEX 20X15 (Mesh General) ×2 IMPLANT
NDL SPNL 22GX3.5 QUINCKE BK (NEEDLE) ×1 IMPLANT
NEEDLE INSUFFLATION 14GA 150MM (NEEDLE) IMPLANT
NEEDLE SPNL 22GX3.5 QUINCKE BK (NEEDLE) IMPLANT
NS IRRIG 1000ML POUR BTL (IV SOLUTION) ×3 IMPLANT
PACK GENERAL/GYN (CUSTOM PROCEDURE TRAY) ×3 IMPLANT
PEN SKIN MARKING BROAD (MISCELLANEOUS) ×3 IMPLANT
PENCIL BUTTON HOLSTER BLD 10FT (ELECTRODE) ×3 IMPLANT
SCALPEL HARMONIC ACE (MISCELLANEOUS) ×1 IMPLANT
SCISSORS LAP 5X35 DISP (ENDOMECHANICALS) ×2 IMPLANT
SET IRRIG TUBING LAPAROSCOPIC (IRRIGATION / IRRIGATOR) ×3 IMPLANT
SLEEVE ADV FIXATION 5X100MM (TROCAR) ×2 IMPLANT
SLEEVE Z-THREAD 5X100MM (TROCAR) ×4 IMPLANT
SOLUTION ANTI FOG 6CC (MISCELLANEOUS) ×3 IMPLANT
SPONGE GAUZE 4X4 12PLY (GAUZE/BANDAGES/DRESSINGS) IMPLANT
STAPLER VISISTAT 35W (STAPLE) IMPLANT
STRIP CLOSURE SKIN 1/2X4 (GAUZE/BANDAGES/DRESSINGS) IMPLANT
SUT MNCRL AB 4-0 PS2 18 (SUTURE) IMPLANT
SUT NOVA 0 T19/GS 22DT (SUTURE) ×3 IMPLANT
SUT PROLENE 0 CT 1 CR/8 (SUTURE) IMPLANT
SUT SILK 0 (SUTURE) ×3
SUT SILK 0 30XBRD TIE 6 (SUTURE) ×1 IMPLANT
SUT SILK 2 0 KS (SUTURE) ×4 IMPLANT
SYRINGE 10CC LL (SYRINGE) ×3 IMPLANT
TACKER 5MM HERNIA 3.5CML NAB (ENDOMECHANICALS) ×1 IMPLANT
TIPS TEFLON (MISCELLANEOUS) ×3 IMPLANT
TOWEL OR 17X26 10 PK STRL BLUE (TOWEL DISPOSABLE) ×6 IMPLANT
TRAY FOLEY CATH 14FRSI W/METER (CATHETERS) ×3 IMPLANT
TRAY LAP CHOLE (CUSTOM PROCEDURE TRAY) IMPLANT
TROCAR ADV FIXATION 11X100MM (TROCAR) IMPLANT
TROCAR ADV FIXATION 5X100MM (TROCAR) IMPLANT
TROCAR BLADELESS OPT 5 75 (ENDOMECHANICALS) ×2 IMPLANT
TROCAR XCEL BLUNT TIP 100MML (ENDOMECHANICALS) IMPLANT
TROCAR Z-THREAD FIOS 11X100 BL (TROCAR) IMPLANT
TROCAR Z-THREAD FIOS 12X100MM (TROCAR) ×3 IMPLANT
TROCAR Z-THREAD FIOS 5X100MM (TROCAR) ×1 IMPLANT
TROCAR Z-THREAD SLEEVE 11X100 (TROCAR) IMPLANT
TUBING FILTER THERMOFLATOR (ELECTROSURGICAL) ×1 IMPLANT

## 2011-05-05 NOTE — ED Notes (Signed)
Pt c/o hernia pain, having surgery on 05/21/11. Pt states is now vomiting green emesis. Pt denies fever states did have chills.

## 2011-05-05 NOTE — Transfer of Care (Signed)
Immediate Anesthesia Transfer of Care Note  Patient: [name]  Procedure(s) Performed:  LAPAROSCOPIC INCISIONAL HERNIA - LAPAROSCOPIC REPAIR INCARCERATED INCISIONAL HERNIA  WITH MESH  Patient Location: PACU  Anesthesia Type: General  Level of Consciousness: awake, patient cooperative, lethargic and responds to stimulation  Airway & Oxygen Therapy: Patient Spontanous Breathing and Patient connected to face mask oxygen  Post-op Assessment: Report given to PACU RN, Post -op Vital signs reviewed and stable and Patient moving all extremities X 4  Post vital signs: Reviewed and stable  Complications: No apparent anesthesia complications

## 2011-05-05 NOTE — ED Provider Notes (Signed)
History     CSN: [id number] Arrival date & time: 05/05/2011  9:48 AM   First MD Initiated Contact with Patient 05/05/11 [phone]      Chief Complaint  Patient presents with  . Hernia    (Consider location/radiation/quality/duration/timing/severity/associated sxs/prior treatment) HPI This 65 year old female presents with abdominal pain, nausea, vomiting. She has a known ventral hernia, and is scheduled for elective repair in 2 weeks. She notes symptoms began gradually 3 days ago. Since onset she has had persistent discomfort about her supra-umbilical area. The pain is described as sharp, worse with different positions, not relieved by anything. She also notes persistent nausea since that time. Vomiting has been persistent, not relieved by anything. No bowel movements since the onset of pain, though the patient continues to have flatus. No fever, + chills, no disorientation, no other pain, no chest pain, no dyspnea Past Medical History  Diagnosis Date  . Cancer   . Asthma   . Blood transfusion   . GERD (gastroesophageal reflux disease)   . Thyroid disease   . Allergy   . Hernia     Past Surgical History  Procedure Date  . Breast surgery   . Breast lumpectomy   . Mastectomy 1997    right breast  . Cholecystectomy   . Knee surgery     x5  . Joint replacement     right knee x2  . Joint replacement     left knee x5  . Back surgery   . Foot surgery     fused left foot  . Tonsillectomy 68  . Thyroid surgery   . Hand tendon surgery     right hand tendon tear  . Rotator cuff repair     right rotator cuff  . Thyroidectomy     Family History  Problem Relation Age of Onset  . Cancer Father 65    lung and kidney     History  Substance Use Topics  . Smoking status: Former Games developer  . Smokeless tobacco: Never Used  . Alcohol Use: No    OB History    Grav Para Term Preterm Abortions TAB SAB Ect Mult Living                  Review of Systems  Constitutional:   HPI  HENT:       HPI otherwise negative  Eyes: Negative.   Respiratory:       HPI, otherwise negative  Cardiovascular:       HPI, otherwise nmegative  Gastrointestinal: Positive for vomiting.  Genitourinary:       HPI, otherwise negative  Musculoskeletal:       HPI, otherwise negative  Skin: Negative.   Neurological: Negative for syncope.    Allergies  Diazepam and Penicillins  Home Medications   Current Outpatient Rx  Name Route Sig Dispense Refill  . BUPROPION HCL ER (XL) 300 MG PO TB24 Oral Take 300 mg by mouth daily.      . CELECOXIB 200 MG PO CAPS Oral Take 200 mg by mouth daily.      Marland Kitchen CITALOPRAM HYDROBROMIDE 40 MG PO TABS Oral Take 40 mg by mouth daily.      Marland Kitchen ESOMEPRAZOLE MAGNESIUM 40 MG PO CPDR Oral Take 40 mg by mouth daily before breakfast.      . FLUTICASONE-SALMETEROL 100-50 MCG/DOSE IN AEPB Inhalation Inhale 1 puff into the lungs as needed.      . FUROSEMIDE 40 MG PO TABS Oral Take 40  mg by mouth daily.      Marland Kitchen LEVOTHYROXINE SODIUM 200 MCG PO TABS Oral Take 200 mcg by mouth daily.      Marland Kitchen PRESERVISION/LUTEIN PO Oral Take by mouth.        BP 124/74  Pulse 91  Temp(Src) 98.6 F (37 C) (Oral)  Resp 16  SpO2 94%  Physical Exam  Nursing note and vitals reviewed. Constitutional: She is oriented to person, place, and time. She appears well-developed and well-nourished.  HENT:  Head: Normocephalic and atraumatic.  Eyes: EOM are normal.  Cardiovascular: Normal rate and regular rhythm.   Pulmonary/Chest: Effort normal and breath sounds normal.  Abdominal: She exhibits distension and mass. There is tenderness. There is guarding.       Palpable firm mass protruding from the patient's mid abdomen area and non-reducible  Musculoskeletal: She exhibits no edema and no tenderness.  Neurological: She is alert and oriented to person, place, and time.  Skin: Skin is warm and dry.    ED Course  Procedures (including critical care time)   Labs Reviewed  CBC    DIFFERENTIAL  COMPREHENSIVE METABOLIC PANEL  LIPASE, BLOOD  URINALYSIS, ROUTINE W REFLEX MICROSCOPIC   No results found.   No diagnosis found.   Ct reviewed by me.  NG placed in ED MDM  This 65 year old female with a known ventral hernia now presents with several days of abdominal pain, nausea, vomiting. On exam she is nondistressed though she is uncomfortable. The patient's CT notable for an incarcerated hernia. There is also a small bowel obstruction. The patient will be admitted for further evaluation and management of her condition.        Gerhard Munch, MD 05/05/11 [phone]

## 2011-05-05 NOTE — Anesthesia Postprocedure Evaluation (Signed)
  Anesthesia Post-op Note  Patient: [name]  Procedure(s) Performed:  LAPAROSCOPIC INCISIONAL HERNIA - LAPAROSCOPIC REPAIR INCARCERATED INCISIONAL HERNIA  WITH MESH  Patient Location: PACU  Anesthesia Type: General  Level of Consciousness: awake and alert   Airway and Oxygen Therapy: Patient Spontanous Breathing with oxygen  Post-op Pain: mild  Post-op Assessment: Post-op Vital signs reviewed, Patient's Cardiovascular Status Stable, Respiratory Function Stable, Patent Airway and No signs of Nausea or vomiting  Post-op Vital Signs: stable  Complications: No apparent anesthesia complications

## 2011-05-05 NOTE — Op Note (Signed)
Surgeon: Glenna Fellows T   Assistants: None  Anesthesia: General endotracheal anesthesia  Indications: incarcerated ventral incisional hernia with small bowel obstructio   Procedure Detail : patient is a 65 year old female with morbid obesity and a known relatively small isolated ventral incisional hernia in her low epigastrium secondary to a previous laparoscopic port from cholecystectomy. She had been scheduled for elective repair in about one week but presented today with 24 hours of persistent nausea vomiting abdominal pain and CT scan was obtained in the emergency room showing a loop of small bowel incarcerated in the hernia with obstruction. I recommended proceeding with laparoscopic and possible open repair depending on findings. We discussed the nature of the procedure, indications, expected recovery, risks of anesthetic complications, bleeding, infection, bowel injury and recurrence.  Patient is brought to the operating room and placed in the supine position on the operating table. She received preoperative IV antibiotics. Patient timeout was performed and correct patient and proceed verified after widely sterilely prepping and draping the abdomen.  Ioban drape was used. Access was obtained with a 5 mm Optiview trocar in the left upper lateral abdomen without difficulty and pneumoperitoneum established. There was no evidence of trocar injury. Under direct vision an additional 5 mm trocar was placed well laterally in the left midabdomen. Through this trocar using sharp dissection the omental adhesions were taken down from the pelvis and the upper abdomen completely isolating the incarcerated hernia contents. I placed a second 5 mm trocar lower in the left lateral abdomen. Using careful blunt and sharp dissection without energy omentum was brought down out of the hernia defect. A loop of small bowel was exposed which was loosely incarcerated and came down easily. It was slightly  hemorrhagic but clearly viable. It was dilated proximally and decompressed distally and obviously the site of obstruction. Further omental adhesions were completely taken down and the hernia defect completely cleared. It measured about 4 cm in diameter. The falciform ligament was taken down superiorly to allow broad coverage of mesh to the abdominal wall. I measured 8 cm superiorly and inferiorly internally away from the defect. Using a Mellody Dance needle and 2-0 silk passed through the abdominal wall and back out inferiorly I measured an 18 cm length of mesh to cover this area. I chose a 20 x 15 cmpiece of Parietex, composite was chosen for wide coverage. 4  0 Novafil sutures were placed at the 4 clock positions around the mesh and one in the center of the mesh as well. The mesh was moistened, rolled, and introduced into the abdomen through a 12 mm trocar that was placed centrally through the hernia defect. I measured about a centimeter superiorly and inferiorly from a previously marked locations on the abdominal wall to allow for longer mesh and the superior and inferior sutures were brought up through the abdominal wall with a suture passer tightly deploying the mesh in this direction. Holding the center suture up through the hernia defect to center the mesh I pulled the 2 lateral sutures out tautly and brought them to the abdominal wall at this point. This provided very broad coverage in all directions. The sutures were secured. I then used concentrate circles of secure strap tacks to secure the mesh. The abdomen was inspected for hemostasis and there was no evidence of trocar injury or other problems. CO2 was evacuated and trochars removed. Skin incisions were closed with subcuticular Monocryl and Dermabond. A binder was placed and the patient was taken to PACU in good  condition.   Estimated Blood Loss:  less than 50 mL         Drains: None       Blood Given: none          Specimens: None          Complications:  * No complications entered in OR log *         Disposition: PACU - hemodynamically stable.         Condition: stable  Mariella Saa MD, FACS  05/05/2011, 8:46 PM

## 2011-05-05 NOTE — H&P (Signed)
Chief Complaint: Abd pain, known hernia HPI: [name] is an 65 y.o. female who had lap chole earlier this by Dr. Johna Sheriff, she developed a small hernia at her lower mid port site. It has gotten somewhat larger but has been soft.  She and Dr. Johna Sheriff had scheduled elective repair for 12/26. However, over the past few days, she's had progressive nausea and vomiting and obstipation. She woke this morning and noted that her hernia was stuck out and hard and very painful. She has not been able to push it back in. She contacted our office and was advised to come to the ED for urgent eval. Her eval and workup shows an incarcerated loop of SB in the hernia with some inflammatory changes. Surgical consult requested. NO other recent PMH or medication changes.  Past Medical History:  Past Medical History  Diagnosis Date  . Cancer   . Asthma   . Blood transfusion   . GERD (gastroesophageal reflux disease)   . Thyroid disease   . Allergy   . Hernia     Past Surgical History:  Past Surgical History  Procedure Date  . Breast surgery   . Breast lumpectomy   . Mastectomy 1997    right breast  . Cholecystectomy   . Knee surgery     x5  . Joint replacement     right knee x2  . Joint replacement     left knee x5  . Back surgery   . Foot surgery     fused left foot  . Tonsillectomy 68  . Thyroid surgery   . Hand tendon surgery     right hand tendon tear  . Rotator cuff repair     right rotator cuff  . Thyroidectomy     Family History:  Family History  Problem Relation Age of Onset  . Cancer Father 37    lung and kidney     Social History:  reports that she has quit smoking. She has never used smokeless tobacco. She reports that she does not drink alcohol or use illicit drugs.  Allergies:  Allergies  Allergen Reactions  . Diazepam Other (See Comments)    Knocks me out   . Morphine And Related Itching and Rash  . Penicillins Rash    Medications: Medication Sig Dispense  Refill  . celecoxib (CELEBREX) 200 MG capsule Take 200 mg by mouth daily.        . citalopram (CELEXA) 40 MG tablet Take 40 mg by mouth daily.        . Fluticasone-Salmeterol (ADVAIR) 100-50 MCG/DOSE AEPB Inhale 1 puff into the lungs as needed. wheezing      . furosemide (LASIX) 40 MG tablet Take 40 mg by mouth daily.        Marland Kitchen levothyroxine (SYNTHROID, LEVOTHROID) 200 MCG tablet Take 200 mcg by mouth daily.          10 system review complete, see HPI for pertinent positives.  Blood pressure 139/76, pulse 81, temperature 98.6 F (37 C), temperature source Oral, resp. rate 18, SpO2 100.00%. There is no height or weight on file to calculate BMI.   General Appearance:  Alert, cooperative, no distress, appears stated age, obese  Head:  Normocephalic, without obvious abnormality, atraumatic  ENT: Unremarkable  Neck: Supple, symmetrical, trachea midline, no adenopathy.  Lungs:   Clear to auscultation bilaterally, no w/r/r, respirations unlabored without use of accessory muscles.  Chest Wall:  No tenderness or deformity  Heart:  Regular  rate and rhythm, S1, S2 normal, no murmur, rub or gallop. Carotids 2+ without bruit.  Abdomen:   Soft. Focally tender and swollen at midline incision c/w prior trocar site. This area is also warm but no overlying skin changes. Obvious hernia, not reducible, very tender with gentle attempt.  Genitalia:  Normal. No hernias  Rectal:  Deferred.  Extremities: Extremities normal, atraumatic, no cyanosis or edema  Pulses: 2+ and symmetric  Skin: Skin color, texture, turgor normal, no rashes or lesions  Neurologic: Normal affect, no gross deficits.   Results for orders placed during the hospital encounter of 05/05/11 (from the past 48 hour(s))  CBC     Status: Normal   Collection Time   05/05/11 10:50 AM      Component Value Range Comment   WBC 6.4  4.0 - 10.5 (K/uL)    RBC 4.43  3.87 - 5.11 (MIL/uL)    Hemoglobin 12.7  12.0 - 15.0 (g/dL)    HCT 16.1  09.6 - 04.5  (%)    MCV 85.6  78.0 - 100.0 (fL)    MCH 28.7  26.0 - 34.0 (pg)    MCHC 33.5  30.0 - 36.0 (g/dL)    RDW 40.9  81.1 - 91.4 (%)    Platelets 168  150 - 400 (K/uL)   DIFFERENTIAL     Status: Abnormal   Collection Time   05/05/11 10:50 AM      Component Value Range Comment   Neutrophils Relative 81 (*) 43 - 77 (%)    Neutro Abs 5.2  1.7 - 7.7 (K/uL)    Lymphocytes Relative 13  12 - 46 (%)    Lymphs Abs 0.9  0.7 - 4.0 (K/uL)    Monocytes Relative 5  3 - 12 (%)    Monocytes Absolute 0.3  0.1 - 1.0 (K/uL)    Eosinophils Relative 1  0 - 5 (%)    Eosinophils Absolute 0.0  0.0 - 0.7 (K/uL)    Basophils Relative 0  0 - 1 (%)    Basophils Absolute 0.0  0.0 - 0.1 (K/uL)   COMPREHENSIVE METABOLIC PANEL     Status: Abnormal   Collection Time   05/05/11 10:50 AM      Component Value Range Comment   Sodium 137  135 - 145 (mEq/L)    Potassium 3.9  3.5 - 5.1 (mEq/L)    Chloride 101  96 - 112 (mEq/L)    CO2 27  19 - 32 (mEq/L)    Glucose, Bld 134 (*) 70 - 99 (mg/dL)    BUN 12  6 - 23 (mg/dL)    Creatinine, Ser 7.82  0.50 - 1.10 (mg/dL)    Calcium 95.6  8.4 - 10.5 (mg/dL)    Total Protein 6.9  6.0 - 8.3 (g/dL)    Albumin 3.9  3.5 - 5.2 (g/dL)    AST 19  0 - 37 (U/L)    ALT 19  0 - 35 (U/L)    Alkaline Phosphatase 69  39 - 117 (U/L)    Total Bilirubin 0.4  0.3 - 1.2 (mg/dL)    GFR calc non Af Amer >90  >90 (mL/min)    GFR calc Af Amer >90  >90 (mL/min)   LIPASE, BLOOD     Status: Normal   Collection Time   05/05/11 10:50 AM      Component Value Range Comment   Lipase 13  11 - 59 (U/L)   URINALYSIS, ROUTINE W REFLEX  MICROSCOPIC     Status: Abnormal   Collection Time   05/05/11 12:25 PM      Component Value Range Comment   Color, Urine YELLOW  YELLOW     APPearance CLEAR  CLEAR     Specific Gravity, Urine 1.006  1.005 - 1.030     pH 7.0  5.0 - 8.0     Glucose, UA NEGATIVE  NEGATIVE (mg/dL)    Hgb urine dipstick NEGATIVE  NEGATIVE     Bilirubin Urine NEGATIVE  NEGATIVE     Ketones, ur  NEGATIVE  NEGATIVE (mg/dL)    Protein, ur NEGATIVE  NEGATIVE (mg/dL)    Urobilinogen, UA 0.2  0.0 - 1.0 (mg/dL)    Nitrite NEGATIVE  NEGATIVE     Leukocytes, UA SMALL (*) NEGATIVE    URINE MICROSCOPIC-ADD ON     Status: Normal   Collection Time   05/05/11 12:25 PM      Component Value Range Comment   Squamous Epithelial / LPF RARE  RARE     WBC, UA 3-6  <3 (WBC/hpf) WITH CLUMPS   Bacteria, UA RARE  RARE     Ct Abdomen Pelvis W Contrast  05/05/2011  *RADIOLOGY REPORT*  Clinical Data: Abdominal pain and vomiting.  CT ABDOMEN AND PELVIS WITH CONTRAST  Technique:  Multidetector CT imaging of the abdomen and pelvis was performed following the standard protocol during bolus administration of intravenous contrast.  Contrast: OMNIPAQUE IOHEXOL 300 MG/ML IV SOLN  Comparison: CT scan 04/25/2010.  Findings: The lung bases are clear.  There is diffuse fatty infiltration of the liver but no focal hepatic lesions or intrahepatic biliary dilatation.  The gallbladder is surgically absent.  No common bile duct dilatation. The pancreas demonstrates fatty change but no acute inflammation or mass.  The spleen is normal in size.  No focal lesions.  The adrenal glands and kidneys are normal.  There is a small bowel obstruction due to an anterior abdominal wall hernia containing small bowel loops.  No bowel wall thickening or pneumatosis.  There is enhancement of the bowel wall.  There are moderate interstitial changes in the fat surrounding the bowel in the hernia.  The colon appears normal.  The appendix is normal. The aorta is normal in caliber.  Mild atherosclerotic changes.  No mesenteric or retroperitoneal masses or lymphadenopathy.  The uterus and ovaries are normal.  There is a small amount of free pelvic fluid.  Mild diffuse bladder wall thickening is noted.  No discrete mass.  The bony structures are unremarkable.  Bilateral pars defects are noted at L5 with a grade 1 spondylolisthesis.  IMPRESSION: Small  bowel obstruction due to incarcerated hernia in the midline 7 cm above the umbilicus.  Original Report Authenticated By: P. Loralie Champagne, M.D.    Assessment: Principal Problem:  *Incarcerated incisional hernia Active Problems:  Small bowel obstruction Plan: Bowel rest, NGT to LIWS Needs repair as bowel obstructed noted and possible impending bowel compromise based on CT findings. D/W pt that open repair is more likely/necessary in urgent setting, possible resection if concerning. Possible use of mesh with repair, which can get infected and possibly need additional surgery to remove.   Leiana Rund J 05/05/2011, 1:33 PM

## 2011-05-05 NOTE — ED Notes (Signed)
Patient being prepped for OR.  Per MD, OR will be ready in about an hour and half.

## 2011-05-05 NOTE — ED Notes (Signed)
Surgery called and they are on their way to retrieve the pt. Pt notified. NAD.

## 2011-05-05 NOTE — Telephone Encounter (Signed)
Pt called in stating that she has been vomiting since Friday. Stated today it was bitter in taste and greenish/yellow in color. Pt stated she is scheduled for hernia sx on 12/26 with Dr. Johna Sheriff. Pt stated the hernia will not reduce and it is hard, can hold down some food but not much. Advised pt to go to the ER as quickly has possible.

## 2011-05-05 NOTE — ED Notes (Signed)
After administration of 4mg /0.82mL of morphine the patient developed a rash above the IV site in the antecubital area and began itching on her arm. Doctor was notified and benadryl 25mg  was given. Morphine added to allergy list.

## 2011-05-05 NOTE — ED Notes (Signed)
Redness and itching resolved with benadryl. Pt feels better. NAD.

## 2011-05-05 NOTE — Anesthesia Preprocedure Evaluation (Addendum)
Anesthesia Evaluation  Patient identified by MRN, date of birth, ID band Patient awake    Reviewed: Allergy & Precautions, H&P , NPO status , Patient's Chart, lab work & pertinent test results  History of Anesthesia Complications (+) PONV  Airway Mallampati: II TM Distance: >3 FB Neck ROM: full    Dental  (+) Missing, Loose and Dental Advisory Given,    Pulmonary asthma ,  clear to auscultation  Pulmonary exam normal       Cardiovascular Exercise Tolerance: Good neg cardio ROS regular Normal    Neuro/Psych Negative Neurological ROS  Negative Psych ROS   GI/Hepatic negative GI ROS, Neg liver ROS, GERD-  Medicated and Controlled,  Endo/Other  Negative Endocrine ROSMorbid obesity  Renal/GU negative Renal ROS  Genitourinary negative   Musculoskeletal   Abdominal   Peds  Hematology negative hematology ROS (+)   Anesthesia Other Findings   Reproductive/Obstetrics negative OB ROS                          Anesthesia Physical Anesthesia Plan  ASA: III  Anesthesia Plan: General   Post-op Pain Management:    Induction: Intravenous, Rapid sequence and Cricoid pressure planned  Airway Management Planned: Oral ETT  Additional Equipment:   Intra-op Plan:   Post-operative Plan: Extubation in OR  Informed Consent: I have reviewed the patients History and Physical, chart, labs and discussed the procedure including the risks, benefits and alternatives for the proposed anesthesia with the patient or authorized representative who has indicated his/her understanding and acceptance.   Dental Advisory Given  Plan Discussed with: CRNA and Surgeon  Anesthesia Plan Comments:        Anesthesia Quick Evaluation

## 2011-05-05 NOTE — Preoperative (Signed)
Beta Blockers   Reason not to administer Beta Blockers:Not Applicable 

## 2011-05-06 ENCOUNTER — Inpatient Hospital Stay (HOSPITAL_COMMUNITY): Payer: Medicare Other

## 2011-05-06 LAB — BASIC METABOLIC PANEL
Calcium: 9.4 mg/dL (ref 8.4–10.5)
GFR calc non Af Amer: 89 mL/min — ABNORMAL LOW (ref >90)
Glucose, Bld: 166 mg/dL — ABNORMAL HIGH (ref 70–99)
Sodium: 135 mEq/L (ref 135–145)

## 2011-05-06 LAB — CBC
MCH: 28.9 pg (ref 26.0–34.0)
Platelets: 138 10*3/uL — ABNORMAL LOW (ref 150–400)
RBC: 4.25 MIL/uL (ref 3.87–5.11)
WBC: 6.2 10*3/uL (ref 4.0–10.5)

## 2011-05-06 LAB — MRSA PCR SCREENING: MRSA by PCR: NEGATIVE

## 2011-05-06 MED ORDER — FUROSEMIDE 10 MG/ML IJ SOLN
20.0000 mg | Freq: Every day | INTRAMUSCULAR | Status: DC
  Administered 2011-05-06 – 2011-05-09 (×4): 20 mg via INTRAVENOUS
  Filled 2011-05-06 (×5): qty 2

## 2011-05-06 NOTE — Progress Notes (Addendum)
1 Day Post-Op  Subjective: Sore, but pain control adequate. Some nausea, NG with little output, advanced some. No flatus yet.  Objective: Vital signs in last 24 hours: Temp:  [97.7 F (36.5 C)-99.2 F (37.3 C)] 98 F (36.7 C) (12/11 0630) Pulse Rate:  [81-92] 85  (12/11 0630) Resp:  [12-18] 16  (12/11 0630) BP: (72-139)/(41-76) 102/68 mmHg (12/11 0630) SpO2:  [92 %-100 %] 95 % (12/11 0630) Last BM Date: 05/05/11  Intake/Output this shift:    Physical Exam: BP 102/68  Pulse 85  Temp(Src) 98 F (36.7 C) (Oral)  Resp 16  SpO2 95% Lungs: CTA without w/r/r Heart: Regular Abdomen: soft, ND, appropriately tender   Incisions all c/d/i without erythema or hematoma. Ext: No edema or tenderness   Labs: CBC  Basename 05/06/11 0414 05/05/11 1050  WBC 6.2 6.4  HGB 12.3 12.7  HCT 37.3 37.9  PLT 138* 168   BMET  Basename 05/06/11 0414 05/05/11 1050  NA 135 137  K 5.2* 3.9  CL 103 101  CO2 24 27  GLUCOSE 166* 134*  BUN 11 12  CREATININE 0.69 0.60  CALCIUM 9.4 10.1   LFT  Basename 05/05/11 1050  PROT 6.9  ALBUMIN 3.9  AST 19  ALT 19  ALKPHOS 69  BILITOT 0.4  BILIDIR --  IBILI --  LIPASE 13   PT/INR No results found for this basename: LABPROT:2,INR:2 in the last 72 hours ABG No results found for this basename: PHART:2,PCO2:2,PO2:2,HCO3:2 in the last 72 hours  Studies/Results: Ct Abdomen Pelvis W Contrast  05/05/2011  *RADIOLOGY REPORT*  Clinical Data: Abdominal pain and vomiting.  CT ABDOMEN AND PELVIS WITH CONTRAST  Technique:  Multidetector CT imaging of the abdomen and pelvis was performed following the standard protocol during bolus administration of intravenous contrast.  Contrast: OMNIPAQUE IOHEXOL 300 MG/ML IV SOLN  Comparison: CT scan 04/25/2010.  Findings: The lung bases are clear.  There is diffuse fatty infiltration of the liver but no focal hepatic lesions or intrahepatic biliary dilatation.  The gallbladder is surgically absent.  No common  bile duct dilatation. The pancreas demonstrates fatty change but no acute inflammation or mass.  The spleen is normal in size.  No focal lesions.  The adrenal glands and kidneys are normal.  There is a small bowel obstruction due to an anterior abdominal wall hernia containing small bowel loops.  No bowel wall thickening or pneumatosis.  There is enhancement of the bowel wall.  There are moderate interstitial changes in the fat surrounding the bowel in the hernia.  The colon appears normal.  The appendix is normal. The aorta is normal in caliber.  Mild atherosclerotic changes.  No mesenteric or retroperitoneal masses or lymphadenopathy.  The uterus and ovaries are normal.  There is a small amount of free pelvic fluid.  Mild diffuse bladder wall thickening is noted.  No discrete mass.  The bony structures are unremarkable.  Bilateral pars defects are noted at L5 with a grade 1 spondylolisthesis.  IMPRESSION: Small bowel obstruction due to incarcerated hernia in the midline 7 cm above the umbilicus.  Original Report Authenticated By: P. Loralie Champagne, M.D.    Assessment: Principal Problem:  *Incarcerated incisional hernia Active Problems:  Small bowel obstruction Hyper K  Procedure(s): LAPAROSCOPIC INCISIONAL HERNIA  Plan: Cont NGT/NPO Encourage OOB/IS use Await return of bowel fxn Restart Lasix IV  LOS: 1 day    Jezelle Gullick J 05/06/2011

## 2011-05-06 NOTE — Progress Notes (Signed)
Chart reviewed and UR completed. 

## 2011-05-07 ENCOUNTER — Encounter (HOSPITAL_COMMUNITY): Payer: Self-pay | Admitting: General Surgery

## 2011-05-07 LAB — BASIC METABOLIC PANEL
BUN: 12 mg/dL (ref 6–23)
CO2: 30 mEq/L (ref 19–32)
Calcium: 9 mg/dL (ref 8.4–10.5)
Chloride: 101 mEq/L (ref 96–112)
Creatinine, Ser: 0.61 mg/dL (ref 0.50–1.10)

## 2011-05-07 MED ORDER — ALBUTEROL SULFATE (5 MG/ML) 0.5% IN NEBU: 2.5000 mg | INHALATION_SOLUTION | Freq: Four times a day (QID) | RESPIRATORY_TRACT | Status: DC | PRN

## 2011-05-07 MED ORDER — LEVOTHYROXINE SODIUM 200 MCG PO TABS
200.0000 ug | ORAL_TABLET | Freq: Every day | ORAL | Status: DC
  Administered 2011-05-07 – 2011-05-10 (×4): 200 ug via ORAL
  Filled 2011-05-07 (×4): qty 1

## 2011-05-07 MED ORDER — GUAIFENESIN 100 MG/5ML PO SOLN: 5.0000 mL | ORAL | Status: DC | PRN

## 2011-05-07 MED ORDER — CITALOPRAM HYDROBROMIDE 40 MG PO TABS
40.0000 mg | ORAL_TABLET | Freq: Every day | ORAL | Status: DC
  Administered 2011-05-07 – 2011-05-10 (×4): 40 mg via ORAL
  Filled 2011-05-07 (×4): qty 1

## 2011-05-07 MED ORDER — FLUTICASONE-SALMETEROL 100-50 MCG/DOSE IN AEPB
1.0000 | INHALATION_SPRAY | Freq: Two times a day (BID) | RESPIRATORY_TRACT | Status: DC
  Administered 2011-05-07 – 2011-05-10 (×8): 1 via RESPIRATORY_TRACT
  Filled 2011-05-07: qty 14

## 2011-05-07 MED ORDER — BUPROPION HCL ER (XL) 150 MG PO TB24
150.0000 mg | ORAL_TABLET | Freq: Two times a day (BID) | ORAL | Status: DC
  Administered 2011-05-07 – 2011-05-10 (×7): 150 mg via ORAL
  Filled 2011-05-07 (×9): qty 1

## 2011-05-07 MED ORDER — VITAMINS A & D EX OINT
TOPICAL_OINTMENT | CUTANEOUS | Status: AC
  Administered 2011-05-07: 1
  Filled 2011-05-07: qty 5

## 2011-05-07 NOTE — Progress Notes (Signed)
2 Days Post-Op  Subjective: Pt ok. C/o congestion and mucous drainage. No CP or SOB. Denies flatus but hearing good 'grumblings' VOiding well  Objective: Vital signs in last 24 hours: Temp:  [97.4 F (36.3 C)-98.7 F (37.1 C)] 98.7 F (37.1 C) (12/12 0600) Pulse Rate:  [72-81] 77  (12/12 0600) Resp:  [18] 18  (12/12 0600) BP: (99-125)/(59-73) 113/67 mmHg (12/12 0600) SpO2:  [94 %-100 %] 100 % (12/12 0600) Last BM Date: 05/05/11  Intake/Output this shift: Total I/O In: -  Out: 350 [Urine:350]  Physical Exam: BP 113/67  Pulse 77  Temp(Src) 98.7 F (37.1 C) (Oral)  Resp 18  SpO2 100% Lungs: CTA without w/r/r Heart: Regular Abdomen: soft, ND, appropriately tender   Incisions all c/d/i without erythema or hematoma. Ext: No edema or tenderness   Labs: CBC  Basename 05/06/11 0414 05/05/11 1050  WBC 6.2 6.4  HGB 12.3 12.7  HCT 37.3 37.9  PLT 138* 168   BMET  Basename 05/07/11 0355 05/06/11 0414  NA 137 135  K 3.9 5.2*  CL 101 103  CO2 30 24  GLUCOSE 110* 166*  BUN 12 11  CREATININE 0.61 0.69  CALCIUM 9.0 9.4   LFT  Basename 05/05/11 1050  PROT 6.9  ALBUMIN 3.9  AST 19  ALT 19  ALKPHOS 69  BILITOT 0.4  BILIDIR --  IBILI --  LIPASE 13   PT/INR No results found for this basename: LABPROT:2,INR:2 in the last 72 hours ABG No results found for this basename: PHART:2,PCO2:2,PO2:2,HCO3:2 in the last 72 hours  Studies/Results: Dg Abd 1 View  05/06/2011  *RADIOLOGY REPORT*  Clinical Data: NG tube placement.  Status post surgery for small bowel obstruction.  ABDOMEN - 1 VIEW  Comparison: CT scan 05/05/2011  Findings: The NG tube tip is at the GE junction.  This should be advanced several centimeters.  IMPRESSION: NG tube tip is at the GE junction and should be advanced several centimeters into the stomach.  Original Report Authenticated By: P. Loralie Champagne, M.D.   Ct Abdomen Pelvis W Contrast  05/05/2011  *RADIOLOGY REPORT*  Clinical Data: Abdominal  pain and vomiting.  CT ABDOMEN AND PELVIS WITH CONTRAST  Technique:  Multidetector CT imaging of the abdomen and pelvis was performed following the standard protocol during bolus administration of intravenous contrast.  Contrast: OMNIPAQUE IOHEXOL 300 MG/ML IV SOLN  Comparison: CT scan 04/25/2010.  Findings: The lung bases are clear.  There is diffuse fatty infiltration of the liver but no focal hepatic lesions or intrahepatic biliary dilatation.  The gallbladder is surgically absent.  No common bile duct dilatation. The pancreas demonstrates fatty change but no acute inflammation or mass.  The spleen is normal in size.  No focal lesions.  The adrenal glands and kidneys are normal.  There is a small bowel obstruction due to an anterior abdominal wall hernia containing small bowel loops.  No bowel wall thickening or pneumatosis.  There is enhancement of the bowel wall.  There are moderate interstitial changes in the fat surrounding the bowel in the hernia.  The colon appears normal.  The appendix is normal. The aorta is normal in caliber.  Mild atherosclerotic changes.  No mesenteric or retroperitoneal masses or lymphadenopathy.  The uterus and ovaries are normal.  There is a small amount of free pelvic fluid.  Mild diffuse bladder wall thickening is noted.  No discrete mass.  The bony structures are unremarkable.  Bilateral pars defects are noted at L5 with  a grade 1 spondylolisthesis.  IMPRESSION: Small bowel obstruction due to incarcerated hernia in the midline 7 cm above the umbilicus.  Original Report Authenticated By: P. Loralie Champagne, M.D.    Assessment: Principal Problem:  *Incarcerated incisional hernia Active Problems:  Small bowel obstruction   Procedure(s): LAPAROSCOPIC INCISIONAL HERNIA REPAIR  Plan: DC ngt, sips only OOB/IS Robitussin for congestion, resume Advair and Albuterol.  LOS: 2 days    Iona Coach 05/07/2011

## 2011-05-08 MED ORDER — BISACODYL 10 MG RE SUPP
10.0000 mg | Freq: Once | RECTAL | Status: AC
  Administered 2011-05-08: 10 mg via RECTAL
  Filled 2011-05-08: qty 1

## 2011-05-08 MED ORDER — ACETAMINOPHEN 160 MG/5ML PO SOLN
650.0000 mg | Freq: Four times a day (QID) | ORAL | Status: DC | PRN
  Administered 2011-05-08 (×2): 650 mg via ORAL
  Filled 2011-05-08 (×2): qty 20.3

## 2011-05-08 NOTE — Progress Notes (Signed)
3 Days Post-Op  Subjective: Pt ok. Belching some but also passing flatus. No BM Pain control ok. Foley remains  Objective: Vital signs in last 24 hours: Temp:  [98.1 F (36.7 C)-98.9 F (37.2 C)] 98.9 F (37.2 C) (12/13 0600) Pulse Rate:  [87-94] 87  (12/13 0600) Resp:  [18] 18  (12/13 0600) BP: (124-132)/(69-80) 132/80 mmHg (12/13 0600) SpO2:  [91 %-95 %] 95 % (12/13 0814) FiO2 (%):  [2 %] 2 % (12/13 0600) Last BM Date: 05/05/11  Intake/Output this shift:    Physical Exam: BP 132/80  Pulse 87  Temp(Src) 98.9 F (37.2 C) (Oral)  Resp 18  SpO2 95% Abdomen: mildly distended but soft. Few BS Incisions all c/d/i  Labs: CBC  Basename 05/06/11 0414 05/05/11 1050  WBC 6.2 6.4  HGB 12.3 12.7  HCT 37.3 37.9  PLT 138* 168   BMET  Basename 05/07/11 0355 05/06/11 0414  NA 137 135  K 3.9 5.2*  CL 101 103  CO2 30 24  GLUCOSE 110* 166*  BUN 12 11  CREATININE 0.61 0.69  CALCIUM 9.0 9.4   LFT  Basename 05/05/11 1050  PROT 6.9  ALBUMIN 3.9  AST 19  ALT 19  ALKPHOS 69  BILITOT 0.4  BILIDIR --  IBILI --  LIPASE 13   PT/INR No results found for this basename: LABPROT:2,INR:2 in the last 72 hours ABG No results found for this basename: PHART:2,PCO2:2,PO2:2,HCO3:2 in the last 72 hours  Studies/Results: Dg Abd 1 View  05/06/2011  *RADIOLOGY REPORT*  Clinical Data: NG tube placement.  Status post surgery for small bowel obstruction.  ABDOMEN - 1 VIEW  Comparison: CT scan 05/05/2011  Findings: The NG tube tip is at the GE junction.  This should be advanced several centimeters.  IMPRESSION: NG tube tip is at the GE junction and should be advanced several centimeters into the stomach.  Original Report Authenticated By: P. Loralie Champagne, M.D.    Assessment: Principal Problem:  *Incarcerated incisional hernia Active Problems:  Small bowel obstruction   Procedure(s): LAPAROSCOPIC INCISIONAL HERNIA  Plan: DC foley Keep on sips until increased bowel fxn Trial  suppository  LOS: 3 days    Naara Kelty J 05/08/2011

## 2011-05-09 LAB — BASIC METABOLIC PANEL
BUN: 5 mg/dL — ABNORMAL LOW (ref 6–23)
CO2: 27 mEq/L (ref 19–32)
Calcium: 9 mg/dL (ref 8.4–10.5)
Creatinine, Ser: 0.53 mg/dL (ref 0.50–1.10)
GFR calc non Af Amer: >90 mL/min (ref >90)
Glucose, Bld: 134 mg/dL — ABNORMAL HIGH (ref 70–99)
Sodium: 136 mEq/L (ref 135–145)

## 2011-05-09 NOTE — Progress Notes (Signed)
4 Days Post-Op  Subjective: More flatus and small mucous BM, less belching. Denies N/V, feels like she is ready for po's  Objective: Vital signs in last 24 hours: Temp:  [98.1 F (36.7 C)-98.8 F (37.1 C)] 98.1 F (36.7 C) (12/14 0600) Pulse Rate:  [83-85] 83  (12/14 0600) Resp:  [18] 18  (12/14 0600) BP: (101-118)/(67-74) 101/67 mmHg (12/14 0600) SpO2:  [92 %-95 %] 92 % (12/14 0600) Weight:  [260 lb (117.935 kg)] 260 lb (117.935 kg) (12/13 1900) Last BM Date: 05/05/11  Intake/Output this shift:    Physical Exam: BP 101/67  Pulse 83  Temp(Src) 98.1 F (36.7 C) (Oral)  Resp 18  Ht 5\' 5"  (1.651 m)  Wt 260 lb (117.935 kg)  BMI 43.27 kg/m2  SpO2 92% Abdomen: less distended. Good BS All incisions c/d/i  Labs: CBC No results found for this basename: WBC:2,HGB:2,HCT:2,PLT:2 in the last 72 hours BMET  [facility] 05/09/11 0355 05/07/11 0355  NA 136 137  K 3.9 3.9  CL 102 101  CO2 27 30  GLUCOSE 134* 110*  BUN 5* 12  CREATININE 0.53 0.61  CALCIUM 9.0 9.0   LFT No results found for this basename: PROT,ALBUMIN,AST,ALT,ALKPHOS,BILITOT,BILIDIR,IBILI,LIPASE in the last 72 hours PT/INR No results found for this basename: LABPROT:2,INR:2 in the last 72 hours ABG No results found for this basename: PHART:2,PCO2:2,PO2:2,HCO3:2 in the last 72 hours  Studies/Results: No results found.  Assessment: Principal Problem:  *Incarcerated incisional hernia Active Problems:  Small bowel obstruction   Procedure(s): LAPAROSCOPIC INCISIONAL HERNIA  Plan: Clear liquids diet, advance to fulls if tolerated well Keep walking  LOS: 4 days    Marianna Fuss 05/09/2011

## 2011-05-09 NOTE — Progress Notes (Signed)
Pt seen and agree with note and treatment plan. 

## 2011-05-10 MED ORDER — PANTOPRAZOLE SODIUM 40 MG PO TBEC: 40.0000 mg | DELAYED_RELEASE_TABLET | Freq: Every day | ORAL | Status: DC

## 2011-05-10 MED ORDER — OXYCODONE-ACETAMINOPHEN 5-325 MG PO TABS: 1.0000 | ORAL_TABLET | ORAL | Status: AC | PRN

## 2011-05-10 NOTE — Progress Notes (Signed)
Protonix IV  Patient meets criteria set forth by P&T Committee for auto conversion to po protonix: eating full liquids or better, tolerating other enteral meds, no GIB. Will change to po protonix.  Gwen Her PharmD  [phone] 05/10/2011 10:39 AM

## 2011-05-10 NOTE — Progress Notes (Signed)
5 Days Post-Op  Subjective: Tolerating full liquids.  Had a BM.  Objective: Vital signs in last 24 hours: Temp:  [97.8 F (36.6 C)-99.3 F (37.4 C)] 98 F (36.7 C) (12/15 0600) Pulse Rate:  [78-89] 78  (12/15 0600) Resp:  [18] 18  (12/15 0600) BP: (111-123)/(72-74) 114/74 mmHg (12/15 0600) SpO2:  [93 %-96 %] 94 % (12/15 0850) FiO2 (%):  [21 %] 21 % (12/14 2143) Last BM Date: 05/05/11  Intake/Output from previous day: 12/14 0701 - 12/15 0700 In: 3401.7 [P.O.:240; I.V.:3161.7] Out: 3300 [Urine:3300] Intake/Output this shift:    PE: Abd-soft, obese, incisions c/d/i  Lab Results:  No results found for this basename: WBC:2,HGB:2,HCT:2,PLT:2 in the last 72 hours BMET  [facility] 05/09/11 0355  NA 136  K 3.9  CL 102  CO2 27  GLUCOSE 134*  BUN 5*  CREATININE 0.53  CALCIUM 9.0   PT/INR No results found for this basename: LABPROT:2,INR:2 in the last 72 hours Comprehensive Metabolic Panel:    Component Value Date/Time   NA 136 05/09/2011 0355   K 3.9 05/09/2011 0355   CL 102 05/09/2011 0355   CO2 27 05/09/2011 0355   BUN 5* 05/09/2011 0355   CREATININE 0.53 05/09/2011 0355   GLUCOSE 134* 05/09/2011 0355   CALCIUM 9.0 05/09/2011 0355   AST 19 05/05/2011 1050   ALT 19 05/05/2011 1050   ALKPHOS 69 05/05/2011 1050   BILITOT 0.4 05/05/2011 1050   PROT 6.9 05/05/2011 1050   ALBUMIN 3.9 05/05/2011 1050     Studies/Results: No results found.  Anti-infectives: Anti-infectives     Start     Dose/Rate Route Frequency Ordered Stop   05/05/11 1415   ciprofloxacin (CIPRO) IVPB 400 mg        400 mg 200 mL/hr over 60 Minutes Intravenous  Once 05/05/11 1406 05/05/11 1521          Assessment Principal Problem:  *Incarcerated incisional hernia s/p repair Active Problems:  Ileus-resolved.    LOS: 5 days   Plan: Advance diet.  Home later today or tomorrow if diet tolerated.  Instructions given.   [name] 05/10/2011

## 2011-05-12 ENCOUNTER — Telehealth (INDEPENDENT_AMBULATORY_CARE_PROVIDER_SITE_OTHER): Payer: Self-pay | Admitting: General Surgery

## 2011-05-12 NOTE — Telephone Encounter (Signed)
Pt called in stating she had emergency hernia sx on 05/05/11 by [facility] and wanted to know if he could also see the site since he has appt tmrw. Confirmed with Neysa Bonito that would not be a problem. Will call back patient to advise.

## 2011-05-13 ENCOUNTER — Encounter (INDEPENDENT_AMBULATORY_CARE_PROVIDER_SITE_OTHER): Payer: Self-pay | Admitting: General Surgery

## 2011-05-13 ENCOUNTER — Ambulatory Visit (INDEPENDENT_AMBULATORY_CARE_PROVIDER_SITE_OTHER): Payer: Medicare Other | Admitting: General Surgery

## 2011-05-13 VITALS — BP 126/72 | HR 68 | Temp 97.2°F | Resp 18 | Ht 65.5 in | Wt 265.2 lb

## 2011-05-13 DIAGNOSIS — T8140XA Infection following a procedure, unspecified, initial encounter: Secondary | ICD-10-CM

## 2011-05-13 NOTE — Progress Notes (Signed)
Patient returns to the office just over one week following emergency laparoscopic repair of an incarcerated incisional hernia that contained a loop of small bowel. She presented with small bowel obstruction. She did not require resection and underwent laparoscopic repair. She's getting along pretty well. She has mild discomfort. She had some constipation likely related to pain medications.  On exam she is afebrile. She does not appear ill. Abdomen is soft and nontender. Incisions are all healing well. There is no evidence of recurrent hernia.  Assessment and plan: Doing well following laparoscopic repair of her incarcerated incisional hernia. I advised her to increase her MiraLax as needed for constipation. She is to return in 6 weeks for what should be a final check.

## 2011-05-21 ENCOUNTER — Inpatient Hospital Stay (HOSPITAL_COMMUNITY): Admission: RE | Admit: 2011-05-21 | Payer: Medicare Other | Source: Ambulatory Visit | Admitting: General Surgery

## 2011-05-21 ENCOUNTER — Encounter (HOSPITAL_COMMUNITY): Admission: RE | Payer: Self-pay | Source: Ambulatory Visit

## 2011-05-21 SURGERY — REPAIR, HERNIA, INCISIONAL, LAPAROSCOPIC
Anesthesia: General

## 2011-05-26 ENCOUNTER — Other Ambulatory Visit: Payer: Self-pay | Admitting: Oncology

## 2011-05-26 DIAGNOSIS — C50919 Malignant neoplasm of unspecified site of unspecified female breast: Secondary | ICD-10-CM

## 2011-05-28 ENCOUNTER — Encounter: Payer: Self-pay | Admitting: Oncology

## 2011-05-28 ENCOUNTER — Other Ambulatory Visit: Payer: Self-pay | Admitting: Oncology

## 2011-05-28 DIAGNOSIS — C50919 Malignant neoplasm of unspecified site of unspecified female breast: Secondary | ICD-10-CM

## 2011-05-28 DIAGNOSIS — F320 Major depressive disorder, single episode, mild: Secondary | ICD-10-CM

## 2011-05-28 DIAGNOSIS — C50911 Malignant neoplasm of unspecified site of right female breast: Secondary | ICD-10-CM | POA: Insufficient documentation

## 2011-05-28 DIAGNOSIS — F32A Depression, unspecified: Secondary | ICD-10-CM

## 2011-05-28 DIAGNOSIS — D696 Thrombocytopenia, unspecified: Secondary | ICD-10-CM

## 2011-05-28 DIAGNOSIS — M47816 Spondylosis without myelopathy or radiculopathy, lumbar region: Secondary | ICD-10-CM

## 2011-05-28 DIAGNOSIS — D0510 Intraductal carcinoma in situ of unspecified breast: Secondary | ICD-10-CM | POA: Insufficient documentation

## 2011-05-28 DIAGNOSIS — E039 Hypothyroidism, unspecified: Secondary | ICD-10-CM

## 2011-05-28 DIAGNOSIS — M797 Fibromyalgia: Secondary | ICD-10-CM

## 2011-05-28 DIAGNOSIS — K219 Gastro-esophageal reflux disease without esophagitis: Secondary | ICD-10-CM

## 2011-05-28 HISTORY — DX: Hypothyroidism, unspecified: E03.9

## 2011-05-28 HISTORY — DX: Depression, unspecified: F32.A

## 2011-05-28 HISTORY — DX: Malignant neoplasm of unspecified site of right female breast: C50.911

## 2011-05-28 HISTORY — DX: Major depressive disorder, single episode, mild: F32.0

## 2011-05-28 HISTORY — DX: Spondylosis without myelopathy or radiculopathy, lumbar region: M47.816

## 2011-05-28 NOTE — Discharge Summary (Signed)
  Patient ID: [name] [id number] 66 y.o. 07-27-1945  05/05/2011  Discharge date and time: 05/10/2011  Admitting Physician: Glenna Fellows T  Discharge Physician: Glenna Fellows T  Admission Diagnoses: Incarcerated incisional hernia [552.21] Small bowel obstruction [560.9] HERNIA  Discharge Diagnoses:same  Operations: Procedure(s): LAPAROSCOPIC INCISIONAL HERNIA  Admission Condition: poor  Discharged Condition: good  Indication for Admission: patient is a 66 year old female with morbid obesity and a known periumbilical ventral incisional hernia. Repair was scheduled for later this month. She however presents with 48 hours of persistent nausea and vomiting and pain at the hernia site. A CT scan was obtained in the emergency room showing incarcerated small bowel in the hernia with evidence of obstruction. She is admitted for emergency repair.  Hospital Course: on the day of admission the patient underwent laparoscopic repair of her ventral incisional hernia. There was no bowel damage and the hernia was relatively small. Her postoperative course was uneventful. Her NG tube was left for 24 hours. By the third postoperative day she was begun on sips of clear liquids which she tolerated well and her diet was gradually advanced. Her wounds healed without incident. By December 15 she was advanced to a soft diet and pain was well controlled on oral medications. She is discharged home this time to return to the office in 2 weeks.  Consults: none  Significant Diagnostic Studies: radiology: CT scan: showed evidence of small bowel obstruction with incarceration and a ventral incisional hernia  Treatments: surgery: laparoscopic repair of ventral incisional hernia  Disposition: Home  Patient Instructions:   [name], [name]  Home Medication Instructions [id number]   Printed on:05/28/11 1232  Medication Information                    celecoxib (CELEBREX) 200 MG capsule Take  200 mg by mouth daily.             levothyroxine (SYNTHROID, LEVOTHROID) 200 MCG tablet Take 200 mcg by mouth daily.             citalopram (CELEXA) 40 MG tablet Take 40 mg by mouth daily.             Fluticasone-Salmeterol (ADVAIR) 100-50 MCG/DOSE AEPB Inhale 1 puff into the lungs as needed. wheezing           furosemide (LASIX) 40 MG tablet Take 40 mg by mouth daily.             Calcium Carbonate-Vitamin D (CALCIUM + D PO) Take 1 tablet by mouth daily.             Omega-3 Fatty Acids (FISH OIL) 1200 MG CAPS Take 1 capsule by mouth daily.             OVER THE COUNTER MEDICATION Take 1 tablet by mouth daily. occuvision support. 1 tablet daily            buPROPion (WELLBUTRIN XL) 150 MG 24 hr tablet Take 150 mg by mouth 2 (two) times daily.             pantoprazole (PROTONIX) 40 MG tablet Take 40 mg by mouth daily.               Activity: no heavy lifting for 4 weeks Diet: regular diet Wound Care: none needed  Follow-up:  With Dr. Johna Sheriff in 2 weeks.  Signed: Mariella Saa MD, FACS  05/28/2011, 12:32 PM

## 2011-05-30 ENCOUNTER — Ambulatory Visit: Payer: Medicare Other | Admitting: Oncology

## 2011-06-19 ENCOUNTER — Encounter (INDEPENDENT_AMBULATORY_CARE_PROVIDER_SITE_OTHER): Payer: Self-pay | Admitting: General Surgery

## 2011-06-19 ENCOUNTER — Other Ambulatory Visit (INDEPENDENT_AMBULATORY_CARE_PROVIDER_SITE_OTHER): Payer: Self-pay

## 2011-06-19 ENCOUNTER — Ambulatory Visit (INDEPENDENT_AMBULATORY_CARE_PROVIDER_SITE_OTHER): Payer: Medicare Other | Admitting: General Surgery

## 2011-06-19 VITALS — BP 114/70 | HR 80 | Temp 97.6°F | Resp 24 | Ht 65.0 in | Wt 253.4 lb

## 2011-06-19 DIAGNOSIS — Z09 Encounter for follow-up examination after completed treatment for conditions other than malignant neoplasm: Secondary | ICD-10-CM

## 2011-06-19 NOTE — Progress Notes (Signed)
Patient returns for more long-term followup after emergency laparoscopic repair of an incarcerated ventral hernia. She reports she is feeling well at this point and back to her normal activities. She still gets some belching and gas but no nausea or abdominal pain.  On examination her wounds are all well healed and with her standing and coughing the hernia repair feels solid.  Assessment and plan: She's done well following her surgery and I see no evidence of complication. Her GI symptoms are somewhat chronic abdominal sure related to the surgery. She will call as needed at this point.

## 2011-07-22 ENCOUNTER — Other Ambulatory Visit: Payer: Self-pay | Admitting: Family Medicine

## 2011-07-22 DIAGNOSIS — N6320 Unspecified lump in the left breast, unspecified quadrant: Secondary | ICD-10-CM

## 2011-07-30 ENCOUNTER — Ambulatory Visit
Admission: RE | Admit: 2011-07-30 | Discharge: 2011-07-30 | Disposition: A | Discharge status: Home / Self Care | Payer: Medicare Other | Source: Ambulatory Visit | Attending: Family Medicine | Admitting: Family Medicine

## 2011-07-30 DIAGNOSIS — N6320 Unspecified lump in the left breast, unspecified quadrant: Secondary | ICD-10-CM

## 2011-08-15 ENCOUNTER — Ambulatory Visit (INDEPENDENT_AMBULATORY_CARE_PROVIDER_SITE_OTHER): Payer: Medicare Other | Admitting: Obstetrics and Gynecology

## 2011-08-15 DIAGNOSIS — N3946 Mixed incontinence: Secondary | ICD-10-CM

## 2011-08-15 DIAGNOSIS — Z9189 Other specified personal risk factors, not elsewhere classified: Secondary | ICD-10-CM

## 2011-08-15 DIAGNOSIS — Z853 Personal history of malignant neoplasm of breast: Secondary | ICD-10-CM

## 2011-08-26 ENCOUNTER — Other Ambulatory Visit: Payer: Self-pay | Admitting: Medical Oncology

## 2011-08-26 DIAGNOSIS — C50919 Malignant neoplasm of unspecified site of unspecified female breast: Secondary | ICD-10-CM

## 2011-08-26 MED ORDER — LETROZOLE 2.5 MG PO TABS: 2.5000 mg | ORAL_TABLET | Freq: Every day | ORAL | Status: DC

## 2011-08-26 NOTE — Telephone Encounter (Signed)
I spoke with pharmacist Olegario Messier at [facility] to give pt one refill on femara and pt needs to call for a MD appointment.

## 2011-08-27 ENCOUNTER — Other Ambulatory Visit: Payer: Self-pay | Admitting: Oncology

## 2011-08-27 ENCOUNTER — Encounter (INDEPENDENT_AMBULATORY_CARE_PROVIDER_SITE_OTHER): Payer: Medicare Other

## 2011-08-27 DIAGNOSIS — N390 Urinary tract infection, site not specified: Secondary | ICD-10-CM

## 2011-08-27 DIAGNOSIS — N3946 Mixed incontinence: Secondary | ICD-10-CM

## 2011-08-27 DIAGNOSIS — D696 Thrombocytopenia, unspecified: Secondary | ICD-10-CM

## 2011-08-27 DIAGNOSIS — C50919 Malignant neoplasm of unspecified site of unspecified female breast: Secondary | ICD-10-CM

## 2011-08-28 ENCOUNTER — Telehealth: Payer: Self-pay | Admitting: *Deleted

## 2011-08-28 ENCOUNTER — Other Ambulatory Visit: Payer: Self-pay

## 2011-08-28 NOTE — Telephone Encounter (Signed)
patient confirmed over the phone the new date and time on 09-08-2011 starting at 3:30

## 2011-09-08 ENCOUNTER — Ambulatory Visit (HOSPITAL_BASED_OUTPATIENT_CLINIC_OR_DEPARTMENT_OTHER): Payer: Medicare Other | Admitting: Oncology

## 2011-09-08 ENCOUNTER — Other Ambulatory Visit (HOSPITAL_BASED_OUTPATIENT_CLINIC_OR_DEPARTMENT_OTHER): Payer: Medicare Other | Admitting: Lab

## 2011-09-08 VITALS — BP 136/75 | HR 86 | Temp 98.3°F | Ht 66.0 in | Wt 250.8 lb

## 2011-09-08 DIAGNOSIS — Z9010 Acquired absence of unspecified breast and nipple: Secondary | ICD-10-CM

## 2011-09-08 DIAGNOSIS — Z1231 Encounter for screening mammogram for malignant neoplasm of breast: Secondary | ICD-10-CM

## 2011-09-08 DIAGNOSIS — C50919 Malignant neoplasm of unspecified site of unspecified female breast: Secondary | ICD-10-CM

## 2011-09-08 DIAGNOSIS — D0510 Intraductal carcinoma in situ of unspecified breast: Secondary | ICD-10-CM

## 2011-09-08 DIAGNOSIS — Z853 Personal history of malignant neoplasm of breast: Secondary | ICD-10-CM

## 2011-09-08 DIAGNOSIS — Z87898 Personal history of other specified conditions: Secondary | ICD-10-CM

## 2011-09-08 DIAGNOSIS — D696 Thrombocytopenia, unspecified: Secondary | ICD-10-CM

## 2011-09-08 DIAGNOSIS — Z9011 Acquired absence of right breast and nipple: Secondary | ICD-10-CM

## 2011-09-08 DIAGNOSIS — Z9889 Other specified postprocedural states: Secondary | ICD-10-CM

## 2011-09-08 LAB — CBC WITH DIFFERENTIAL/PLATELET
BASO%: 0.8 % (ref 0.0–2.0)
Basophils Absolute: 0 10e3/uL (ref 0.0–0.1)
EOS%: 1.9 % (ref 0.0–7.0)
Eosinophils Absolute: 0.1 10e3/uL (ref 0.0–0.5)
HCT: 37.3 % (ref 34.8–46.6)
HGB: 12.4 g/dL (ref 11.6–15.9)
LYMPH%: 24.6 % (ref 14.0–49.7)
MCH: 28.8 pg (ref 25.1–34.0)
MCHC: 33.2 g/dL (ref 31.5–36.0)
MCV: 86.6 fL (ref 79.5–101.0)
MONO#: 0.3 10e3/uL (ref 0.1–0.9)
MONO%: 5.2 % (ref 0.0–14.0)
NEUT#: 3.8 10e3/uL (ref 1.5–6.5)
NEUT%: 67.5 % (ref 38.4–76.8)
Platelets: 123 10e3/uL — ABNORMAL LOW (ref 145–400)
RBC: 4.3 10e6/uL (ref 3.70–5.45)
RDW: 14 % (ref 11.2–14.5)
WBC: 5.6 10e3/uL (ref 3.9–10.3)
lymph#: 1.4 10e3/uL (ref 0.9–3.3)
nRBC: 0 % (ref 0–0)

## 2011-09-08 LAB — COMPREHENSIVE METABOLIC PANEL WITH GFR
ALT: 18 U/L (ref 0–35)
AST: 23 U/L (ref 0–37)
Albumin: 3.9 g/dL (ref 3.5–5.2)
Alkaline Phosphatase: 84 U/L (ref 39–117)
BUN: 12 mg/dL (ref 6–23)
CO2: 30 meq/L (ref 19–32)
Calcium: 9.6 mg/dL (ref 8.4–10.5)
Chloride: 101 meq/L (ref 96–112)
Creatinine, Ser: 0.72 mg/dL (ref 0.50–1.10)
Glucose, Bld: 111 mg/dL — ABNORMAL HIGH (ref 70–99)
Potassium: 4.6 meq/L (ref 3.5–5.3)
Sodium: 139 meq/L (ref 135–145)
Total Bilirubin: 0.3 mg/dL (ref 0.3–1.2)
Total Protein: 6.7 g/dL (ref 6.0–8.3)

## 2011-09-08 LAB — LACTATE DEHYDROGENASE: LDH: 235 U/L (ref 94–250)

## 2011-09-09 ENCOUNTER — Other Ambulatory Visit: Payer: Medicare Other

## 2011-09-09 NOTE — Progress Notes (Signed)
Hematology and Oncology Follow Up Visit  [name] [id number] 05-Dec-1945 66 y.o. 09/09/2011 9:20 AM   Principle Diagnosis: Encounter Diagnoses  Name Primary?  . Carcinoma of breast, stage 2, estrogen receptor positive Yes  . DCIS (ductal carcinoma in situ) of breast   . 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  Second DCIS left breast status post lumpectomy November 2005.  She was put on Femara and remains on this drug at present. She has had ongoing evaluations for atypical abdominal pain and change in bowel habits. She was evaluated in December of 2011 for elevated serum transaminases. CT scan of the abdomen was grossly normal but she did have signs and symptoms suggestive of chronic cholecystitis. She was seen by Dr. Sharrell Ku gastroenterology. She was referred to Dr. Odie Sera general surgery, and underwent a laparoscopic cholecystectomy in January 2012. She developed an incisional hernia with subsequent small bowel obstruction due to incarceration and required surgical repair in December 2012.  She is now having bilateral lower quadrant abdominal pain. Some intermittent vaginal spotting. She is under evaluation by her gynecologist Dr. Estanislado Pandy. She is due to have a transvaginal ultrasound on April 25.  She has developed a rotator cuff injury after a fall. She is under evaluation by Dr. Lorin Picket being orthopedic surgery.  Medications: reviewed  Allergies:  Allergies  Allergen Reactions  . Diazepam Other (See Comments)    Knocks me out   . Morphine And Related Itching and Rash  . Penicillins Rash    Review of Systems: Constitutional:   Chronic  fatigue and anxiety Respiratory: No cough or dyspnea Cardiovascular:  No chest pain or palpitations Gastrointestinal: See above Genito-Urinary: See above Musculoskeletal: See above Neurologic: Intermittent right-sided headache likely related to her rotator cuff problem Skin: No rash or ecchymosis Remaining ROS negative.  Physical Exam: Blood pressure 136/75, pulse 86, temperature 98.3 F (36.8 C), temperature source Oral, height 5\' 6"  (1.676 m), weight 250 lb 12.8 oz (113.762 kg). Wt Readings from Last 3 Encounters:  09/08/11 250 lb 12.8 oz (113.762 kg)  06/19/11 253 lb 6.4 oz (114.941 kg)  05/13/11 265 lb 4 oz (120.317 kg)     General appearance: Well-nourished Caucasian woman she has lost 15 pounds since last evaluated here over one year ago HENNT: Pharynx no erythema or exudate Lymph nodes: No cervical supraclavicular or axillary adenopathy Breasts: Right mastectomy no chest wall lesions no left breast masses Lungs: Clear to auscultation resonant to percussion Heart: Regular rhythm no murmur or gallop Abdomen: Soft tender in both lower abdominal quadrants no mass no organomegaly Extremities: No edema no calf tenderness; scars over both knees status post bilateral knee replacements Vascular: No cyanosis Neurologic: Motor strength is 5 over 5 reflexes absent symmetric at the knees 1+ symmetric at the biceps Skin: No rash or ecchymosis  Lab Results: Lab Results  Component Value Date   WBC 5.6 09/08/2011   HGB 12.4 09/08/2011   HCT 37.3 09/08/2011   MCV 86.6 09/08/2011   PLT 123* 09/08/2011     Chemistry      Component Value Date/Time   NA 139 09/08/2011 1524   K 4.6 09/08/2011 1524   CL  101 09/08/2011 1524   CO2 30 09/08/2011 1524   BUN 12 09/08/2011 1524   CREATININE 0.72 09/08/2011 1524      Component Value Date/Time   CALCIUM 9.6 09/08/2011 1524   ALKPHOS 84 09/08/2011 1524   AST 23 09/08/2011 1524   ALT 18 09/08/2011 1524   BILITOT 0.3 09/08/2011 1524        Radiological Studies: Left mammogram done 07/30/2011 shows postsurgical changes but no new disease. Previous right mastectomy.   Impression and Plan: #1. Metachronous primary cancers of the right breast initial stage II ER positive invasive cancer then subsequent contralateral DCIS in the left breast and treated as outlined above. She remains free of any obvious new disease at this time. She has been on Femara now for approximately 8 years. I told her she could discontinue the hormone at this time.  #2. Chronic abdominal and pelvic pain see discussion above.  #3. Advanced degenerative arthritis status post bilateral knee replacements and now with a new right rotator cuff problem under orthopedic management.  #4. Hypothyroid on replacement.  #5. GERD  #6. Chronic anxiety and depression  #7. Probable fibromyalgia  CC:. Dr. Maxwell Marion; Dr. Dois Davenport Rivard; Dr. Sharrell Ku; Dr. Fara Chute   Levert Feinstein, MD 4/16/20139:20 AM

## 2011-09-15 ENCOUNTER — Other Ambulatory Visit: Payer: Self-pay

## 2011-09-15 DIAGNOSIS — R102 Pelvic and perineal pain: Secondary | ICD-10-CM

## 2011-09-16 ENCOUNTER — Encounter: Payer: Self-pay | Admitting: Obstetrics and Gynecology

## 2011-09-16 ENCOUNTER — Telehealth: Payer: Self-pay

## 2011-09-16 ENCOUNTER — Ambulatory Visit (INDEPENDENT_AMBULATORY_CARE_PROVIDER_SITE_OTHER): Payer: Medicare Other | Admitting: Obstetrics and Gynecology

## 2011-09-16 VITALS — BP 104/64 | HR 70 | Resp 16 | Ht 66.0 in | Wt 246.0 lb

## 2011-09-16 DIAGNOSIS — C50919 Malignant neoplasm of unspecified site of unspecified female breast: Secondary | ICD-10-CM

## 2011-09-16 DIAGNOSIS — N3946 Mixed incontinence: Secondary | ICD-10-CM

## 2011-09-16 HISTORY — DX: Mixed incontinence: N39.46

## 2011-09-16 NOTE — Progress Notes (Signed)
Here for f/u Lumax C/o mixed incontinence No relief previously with Detrol LA  Filed Vitals:   09/16/11 1401  BP: 104/64  Pulse: 70  Resp: 16   atrophic vagina No cystocele or rectocele   Urine Cx 08/27/11 Lumax Reviewed High volume SUI at low pressure - ?ISD  A/P Sched TVT per pt request after reviewing options and R/B/A Note for [facility] stating whether pt may use vaginal estrogen cream postop for 6wks Keep appt with SR Thurs for u/s Preop

## 2011-09-16 NOTE — Patient Instructions (Signed)
Patient Education Materials to be provided at check out (*indicates is located in accordion folder):  Surgery for Incontinence

## 2011-09-16 NOTE — Telephone Encounter (Signed)
Pt notified per Dr Cyndie Chime - OK for her to use estrogen cream.  Pt verbalizes understanding. dph

## 2011-09-16 NOTE — Telephone Encounter (Signed)
Received call from pt stating that Dr Su Hilt "wanted me to call and see what Dr Cyndie Chime thinks about her putting me on vaginal estrogen cream for 6 weeks."    Note to Dr Cyndie Chime. dph

## 2011-09-18 ENCOUNTER — Encounter: Payer: Self-pay | Admitting: Obstetrics and Gynecology

## 2011-09-18 ENCOUNTER — Ambulatory Visit (INDEPENDENT_AMBULATORY_CARE_PROVIDER_SITE_OTHER): Payer: Medicare Other | Admitting: Obstetrics and Gynecology

## 2011-09-18 ENCOUNTER — Other Ambulatory Visit: Payer: Self-pay | Admitting: Obstetrics and Gynecology

## 2011-09-18 ENCOUNTER — Ambulatory Visit (INDEPENDENT_AMBULATORY_CARE_PROVIDER_SITE_OTHER): Payer: Medicare Other

## 2011-09-18 ENCOUNTER — Other Ambulatory Visit: Payer: Medicare Other

## 2011-09-18 VITALS — BP 126/78 | Wt 248.0 lb

## 2011-09-18 DIAGNOSIS — N949 Unspecified condition associated with female genital organs and menstrual cycle: Secondary | ICD-10-CM

## 2011-09-18 DIAGNOSIS — D0510 Intraductal carcinoma in situ of unspecified breast: Secondary | ICD-10-CM

## 2011-09-18 DIAGNOSIS — R102 Pelvic and perineal pain: Secondary | ICD-10-CM

## 2011-09-18 DIAGNOSIS — C50919 Malignant neoplasm of unspecified site of unspecified female breast: Secondary | ICD-10-CM

## 2011-09-18 DIAGNOSIS — Z170 Estrogen receptor positive status [ER+]: Secondary | ICD-10-CM

## 2011-09-18 DIAGNOSIS — D0590 Unspecified type of carcinoma in situ of unspecified breast: Secondary | ICD-10-CM

## 2011-09-18 NOTE — Progress Notes (Signed)
66 year old married white female here today for a GYN ultrasound due to tenderness in the left lower quadrant at her annual exam.  Ultrasound today shows a normal uterus with a normal endometrium urea and right ovary is normal.  Left ovary is not seen due to bowel gas but no adnexal mass is seen.  Patient is awaiting scheduling with Dr. Osborn Coho to undergo a TVT.  Patient is instructed to communicate with her GI doctor to investigate the abdominal pain which could be GI in origin.  We'll see patient again for her annual exam

## 2011-09-23 ENCOUNTER — Telehealth: Payer: Self-pay | Admitting: Obstetrics and Gynecology

## 2011-09-23 NOTE — Telephone Encounter (Signed)
Phone call from patient requesting vaginal estrogen therapy and given written permission by oncologist, Dr. Cephas Darby to receive this therapy.  (patient has a history of breast cancer)      ----- Message ----- From: Levert Feinstein, MD Sent: 09/23/2011 2:30 PM To: Adrianne Moreno Subject: RE: Authorization to treat Patient with Estr*  I authorize short term use of vaginal estrogen cream for Ms. [name] ----- Message ----- From: Gardiner Ramus Sent: 09/23/2011 1:09 PM To: Henreitta Leber, PA, Levert Feinstein, MD Subject: Authorization to treat Patient with Estrogen*  Patient saw Dr. Osborn Coho on 09/16/11, she's a 2 time cancer survivor. Our PA Henreitta Leber was going to prescribe her estrogen cream X 6 weeks and she is asking for clearance from you for that. The patient relayed a verbal approval, but the PA would like it in writing. Please advise. [name]      Patient may receive:  Estrace Vaginal Cream  #1 tube  apply 1 gram in vagina every other day for 10 days, then twice weekly thereafter  refills for 1 year.  Vanessa Plemmons J. Lowell Guitar, PA-C

## 2011-09-25 ENCOUNTER — Other Ambulatory Visit (HOSPITAL_COMMUNITY): Payer: Self-pay | Admitting: Orthopedic Surgery

## 2011-09-25 DIAGNOSIS — M25561 Pain in right knee: Secondary | ICD-10-CM

## 2011-09-28 ENCOUNTER — Encounter (HOSPITAL_COMMUNITY): Payer: Self-pay

## 2011-09-29 ENCOUNTER — Encounter (HOSPITAL_COMMUNITY)
Admission: RE | Admit: 2011-09-29 | Discharge: 2011-09-29 | Disposition: A | Discharge status: Home / Self Care | Payer: Medicare Other | Source: Ambulatory Visit | Attending: Orthopedic Surgery | Admitting: Orthopedic Surgery

## 2011-09-29 DIAGNOSIS — M25561 Pain in right knee: Secondary | ICD-10-CM

## 2011-09-29 DIAGNOSIS — M25569 Pain in unspecified knee: Secondary | ICD-10-CM | POA: Insufficient documentation

## 2011-09-29 MED ORDER — TECHNETIUM TC 99M EXAMETAZIME IV KIT
6.7000 | PACK | Freq: Once | INTRAVENOUS | Status: AC | PRN
  Administered 2011-09-29: 6.7 via INTRAVENOUS

## 2011-09-30 NOTE — Progress Notes (Signed)
As per Dr. Cyndie Chime, vaginal estrogen in light of pt's history is ok for short term use.

## 2011-10-01 ENCOUNTER — Other Ambulatory Visit: Payer: Self-pay | Admitting: Obstetrics and Gynecology

## 2011-10-06 ENCOUNTER — Encounter: Payer: Self-pay | Admitting: Obstetrics and Gynecology

## 2011-10-06 ENCOUNTER — Ambulatory Visit (INDEPENDENT_AMBULATORY_CARE_PROVIDER_SITE_OTHER): Payer: Medicare Other | Admitting: Obstetrics and Gynecology

## 2011-10-06 ENCOUNTER — Telehealth: Payer: Self-pay | Admitting: Obstetrics and Gynecology

## 2011-10-06 VITALS — BP 120/78 | HR 74 | Temp 98.2°F | Resp 16 | Ht 65.0 in | Wt 249.0 lb

## 2011-10-06 DIAGNOSIS — N3946 Mixed incontinence: Secondary | ICD-10-CM

## 2011-10-06 MED ORDER — ESTRADIOL 10 MCG VA TABS: 10.0000 ug | ORAL_TABLET | VAGINAL | Status: DC

## 2011-10-06 NOTE — Progress Notes (Signed)
[name] is a 66 y.o. female G 0 who presents for pre-operative exam for placement of tension free vaginal tape because of mixed urinary incontinence symptoms. For many years patient has suffered incontinence that has worsened in the past 3 years. She has to wear 2 pads that she changes 2-3 times a day to stay dry. Over the years the patient has found if difficult to make it to the bathroom in time and now, due to inability to walk fast, she has even more of a challenge. She denies dysuria, hematuria, or flank pain. She has noticed that as long as she's supine, she can hold her urine for long periods of time. But her incontinence with standing and walking has caused her to have to change her clothes several times a day. Lumax urodynamics were performed April 2013 and confirmed mixed urinary incontinence.  Given the protracted and disruptive nature of patient's symptoms, she has decided to pursue surgical intervention and management with the placement of tension free vaginal tape.  Past Medical History  OB History: G0  GYN History: menarche 66 YO postmenopausal, The patient denies history of sexually transmitted disease. Denies history of abnormal PAP smear Last PAP smear 2012  Medical History: asthma, breast cancer, depression, thyroid, gastroesophageal reflux disease, abdominal hernia, fibromyalgia, degenerative disc disease-lumbar spine, thrombocytopenia, small bowel obstruction, peptic ulcer disease, Paget's disease, eczema, post-traumatic stress disorder, osteopenia and migraines.  Surgical History: 1968-tonsillectomy, 1970-D & C, 1970-lumbar disc surgery, 1997- Right Lumpectomy/Mastectomy, 2006-Left Breast Lumpectomy, 2010-Left Foot Bone Fusion, Multiple knee surgeries, right hand surgery, rotator cuff repair, hernia repair, partial cholecytecomy, thyroidectomy and history of blood transfusions. Admits to severe nausea and vomiting with some anesthesia  Family History: anxiety, dementia, acid  reflux, and substance abuse.  Social History: married, retired; denies tobacco, alcohol or illicit drug use.  Outpatient Encounter Prescriptions as of 10/06/2011   Medication  Sig  Dispense  Refill   .  benzonatate (TESSALON PERLES) 100 MG capsule  Take 200 mg by mouth 3 (three) times daily as needed. For cough     .  buPROPion (WELLBUTRIN XL) 150 MG 24 hr tablet  Take 150 mg by mouth 2 (two) times daily.     .  Calcium Carbonate-Vitamin D 600-125 MG-UNIT TABS  Take 2 tablets by mouth daily.     .  citalopram (CELEXA) 40 MG tablet  Take 60 mg by mouth daily.     .  docusate sodium (COLACE) 100 MG capsule  Take 300 mg by mouth at bedtime.     .  Fluticasone-Salmeterol (ADVAIR) 250-50 MCG/DOSE AEPB  Inhale 2 puffs into the lungs as needed. For wheezing. Will take 2 puffs before procedure     .  furosemide (LASIX) 40 MG tablet  Take 20-40 mg by mouth daily as needed. For edema     .  GREEN COFFEE BEAN PO  Take by mouth 3 (three) times daily.     .  levothyroxine (SYNTHROID, LEVOTHROID) 175 MCG tablet  Take 175 mcg by mouth daily.     .  Multiple Vitamins-Minerals (WOMENS 50+ MULTI VITAMIN/MIN PO)  Take 1 tablet by mouth daily.     .  nystatin ointment (MYCOSTATIN)  Apply 1 application topically as needed. For biopsied area on bottom     .  Omega-3 Fatty Acids (FISH OIL) 1200 MG CAPS  Take 2 capsules by mouth daily.     .  OVER THE COUNTER MEDICATION  Take 1 tablet by mouth daily.   occuvision support. 1 tablet daily     .  OVER THE COUNTER MEDICATION  Take 125 mg by mouth 3 (three) times daily.     .  pantoprazole (PROTONIX) 40 MG tablet  Take 40 mg by mouth 2 (two) times daily.     .  Polyethylene Glycol 3350 (MIRALAX PO)  Take 34 g by mouth daily.      Allergies   Allergen  Reactions   .  Diazepam  Other (See Comments)     Knocks me out   .  Tape      "thick clear plastic tape" causes blisters   .  Morphine And Related  Itching and Rash   .  Penicillins  Rash   Denies sensitivity to  shellfish, peanuts or soy.  ROS:wears glasses, has a removable partial plate in lower front jaw has frequent coughing, various arthralgias due to previous surgeries and arthritis,; denies headache with vision changes, dysphagia, chest pain, shortness of breath, nausea, vomiting or diarrhea, constipation, dysuria, hematuria, vaginitis symptoms, vaginal bleeding or skin rashes. Except as is mentioned in HPI, patient's review of systems is negative.  Physical Exam   BP 120/78  Pulse 74  Temp 98.2 F (36.8 C)  Resp 16  Ht 5' 5" (1.651 m)  Wt 249 lb (112.946 kg)  BMI 41.44 kg/m2  Neck: supple no masses  Lungs: clear  Heart:regular rate and rhythym  Abdomen: soft, non-tender  Pelvic:patient refused; 08/01/11 exam showed no cystocele/retocele/uterine prolapse  Extremities: no clubbing or cyanosis but patient has chronic lower extremity edema  Assesment: Mixed Urinary Incontinence  Disposition: A discussion was held with patient regarding the indication for her procedure(s) along with the risks, which include but are not limited to: reaction to anesthesia, damage to adjacent organs, infection, excessive bleeding. worsening of symptoms and erosion of tension free vaginal tape. (Patient's husband was also present for review of risks. Patient verbalized understanding of risks and has consented to proceed with placement of tension free vaginal tape at [facility] on 10/13/11 at 9:15 a.m.  CSN# [id number]  Baylin Cabal J. Mana Haberl, PA-C for Dr. Roberts   

## 2011-10-06 NOTE — H&P (Signed)
[name] is a 65 y.o. female G 0 who presents for pre-operative exam for placement of tension free vaginal tape because of mixed urinary incontinence symptoms. For many years patient has suffered incontinence that has worsened in the past 3 years. She has to wear 2 pads that she changes 2-3 times a day to stay dry. Over the years the patient has found if difficult to make it to the bathroom in time and now, due to inability to walk fast, she has even more of a challenge. She denies dysuria, hematuria, or flank pain. She has noticed that as long as she's supine, she can hold her urine for long periods of time. But her incontinence with standing and walking has caused her to have to change her clothes several times a day. Lumax urodynamics were performed April 2013 and confirmed mixed urinary incontinence.  Given the protracted and disruptive nature of patient's symptoms, she has decided to pursue surgical intervention and management with the placement of tension free vaginal tape.  Past Medical History  OB History: G0  GYN History: menarche 66 YO postmenopausal, The patient denies history of sexually transmitted disease. Denies history of abnormal PAP smear Last PAP smear 2012  Medical History: asthma, breast cancer, depression, thyroid, gastroesophageal reflux disease, abdominal hernia, fibromyalgia, degenerative disc disease-lumbar spine, thrombocytopenia, small bowel obstruction, peptic ulcer disease, Paget's disease, eczema, post-traumatic stress disorder, osteopenia and migraines.  Surgical History: 1968-tonsillectomy, 1970-D & C, 1970-lumbar disc surgery, 1997- Right Lumpectomy/Mastectomy, 2006-Left Breast Lumpectomy, 2010-Left Foot Bone Fusion, Multiple knee surgeries, right hand surgery, rotator cuff repair, hernia repair, partial cholecytecomy, thyroidectomy and history of blood transfusions. Admits to severe nausea and vomiting with some anesthesia  Family History: anxiety, dementia, acid  reflux, and substance abuse.  Social History: married, retired; denies tobacco, alcohol or illicit drug use.  Outpatient Encounter Prescriptions as of 10/06/2011   Medication  Sig  Dispense  Refill   .  benzonatate (TESSALON PERLES) 100 MG capsule  Take 200 mg by mouth 3 (three) times daily as needed. For cough     .  buPROPion (WELLBUTRIN XL) 150 MG 24 hr tablet  Take 150 mg by mouth 2 (two) times daily.     .  Calcium Carbonate-Vitamin D 600-125 MG-UNIT TABS  Take 2 tablets by mouth daily.     .  citalopram (CELEXA) 40 MG tablet  Take 60 mg by mouth daily.     .  docusate sodium (COLACE) 100 MG capsule  Take 300 mg by mouth at bedtime.     .  Fluticasone-Salmeterol (ADVAIR) 250-50 MCG/DOSE AEPB  Inhale 2 puffs into the lungs as needed. For wheezing. Will take 2 puffs before procedure     .  furosemide (LASIX) 40 MG tablet  Take 20-40 mg by mouth daily as needed. For edema     .  GREEN COFFEE BEAN PO  Take by mouth 3 (three) times daily.     .  levothyroxine (SYNTHROID, LEVOTHROID) 175 MCG tablet  Take 175 mcg by mouth daily.     .  Multiple Vitamins-Minerals (WOMENS 50+ MULTI VITAMIN/MIN PO)  Take 1 tablet by mouth daily.     .  nystatin ointment (MYCOSTATIN)  Apply 1 application topically as needed. For biopsied area on bottom     .  Omega-3 Fatty Acids (FISH OIL) 1200 MG CAPS  Take 2 capsules by mouth daily.     .  OVER THE COUNTER MEDICATION  Take 1 tablet by mouth daily.   occuvision support. 1 tablet daily     .  OVER THE COUNTER MEDICATION  Take 125 mg by mouth 3 (three) times daily.     .  pantoprazole (PROTONIX) 40 MG tablet  Take 40 mg by mouth 2 (two) times daily.     .  Polyethylene Glycol 3350 (MIRALAX PO)  Take 34 g by mouth daily.      Allergies   Allergen  Reactions   .  Diazepam  Other (See Comments)     Knocks me out   .  Tape      "thick clear plastic tape" causes blisters   .  Morphine And Related  Itching and Rash   .  Penicillins  Rash   Denies sensitivity to  shellfish, peanuts or soy.  ROS:wears glasses, has a removable partial plate in lower front jaw has frequent coughing, various arthralgias due to previous surgeries and arthritis,; denies headache with vision changes, dysphagia, chest pain, shortness of breath, nausea, vomiting or diarrhea, constipation, dysuria, hematuria, vaginitis symptoms, vaginal bleeding or skin rashes. Except as is mentioned in HPI, patient's review of systems is negative.  Physical Exam   BP 120/78  Pulse 74  Temp 98.2 F (36.8 C)  Resp 16  Ht 5' 5" (1.651 m)  Wt 249 lb (112.946 kg)  BMI 41.44 kg/m2  Neck: supple no masses  Lungs: clear  Heart:regular rate and rhythym  Abdomen: soft, non-tender  Pelvic:patient refused; 08/01/11 exam showed no cystocele/retocele/uterine prolapse  Extremities: no clubbing or cyanosis but patient has chronic lower extremity edema  Assesment: Mixed Urinary Incontinence  Disposition: A discussion was held with patient regarding the indication for her procedure(s) along with the risks, which include but are not limited to: reaction to anesthesia, damage to adjacent organs, infection, excessive bleeding. worsening of symptoms and erosion of tension free vaginal tape. (Patient's husband was also present for review of risks. Patient verbalized understanding of risks and has consented to proceed with placement of tension free vaginal tape at [facility] on 10/13/11 at 9:15 a.m.  CSN# [id number]  Cade Dashner J. Lani Havlik, PA-C for Dr. Roberts   

## 2011-10-06 NOTE — Telephone Encounter (Signed)
Ep will you see about this

## 2011-10-06 NOTE — Telephone Encounter (Signed)
Return call to pharmacist at [facility] in [place].  Their concern was about Vagifem (generic) and patient taking Femara.  Explained that patient was not on Femara (stopped at the end of April)-confirmed with patient and that patient's oncologist Dr. Cyndie Chime had given permission for her to be on a vaginal estrogen product.

## 2011-10-06 NOTE — Telephone Encounter (Signed)
[name]/EP pt/had appt today

## 2011-10-07 ENCOUNTER — Other Ambulatory Visit (HOSPITAL_COMMUNITY): Payer: Medicare Other

## 2011-10-07 ENCOUNTER — Encounter (HOSPITAL_COMMUNITY): Payer: Self-pay

## 2011-10-07 ENCOUNTER — Telehealth: Payer: Self-pay | Admitting: Obstetrics and Gynecology

## 2011-10-07 ENCOUNTER — Encounter (HOSPITAL_COMMUNITY)
Admission: RE | Admit: 2011-10-07 | Discharge: 2011-10-07 | Disposition: A | Discharge status: Home / Self Care | Payer: Medicare Other | Source: Ambulatory Visit | Attending: Obstetrics and Gynecology | Admitting: Obstetrics and Gynecology

## 2011-10-07 LAB — CBC
HCT: 39.7 % (ref 36.0–46.0)
MCV: 89 fL (ref 78.0–100.0)
RBC: 4.46 MIL/uL (ref 3.87–5.11)
RDW: 13.8 % (ref 11.5–15.5)
WBC: 4.8 10*3/uL (ref 4.0–10.5)

## 2011-10-07 LAB — BASIC METABOLIC PANEL
BUN: 13 mg/dL (ref 6–23)
CO2: 31 mEq/L (ref 19–32)
Chloride: 103 mEq/L (ref 96–112)
GFR calc Af Amer: >90 mL/min (ref >90)
Potassium: 4.5 mEq/L (ref 3.5–5.1)

## 2011-10-07 NOTE — Patient Instructions (Signed)
YOUR PROCEDURE IS SCHEDULED ON:10/13/11  ENTER THROUGH THE MAIN ENTRANCE OF [facility] AT:7:45 am  USE DESK PHONE AND DIAL [id number] TO INFORM us OF YOUR ARRIVAL  CALL [phone] IF YOU HAVE ANY QUESTIONS OR PROBLEMS PRIOR TO YOUR ARRIVAL.  REMEMBER: DO NOT EAT OR DRINK AFTER MIDNIGHT : Sunday   YOU MAY BRUSH YOUR TEETH THE MORNING OF SURGERY   TAKE THESE MEDICINES THE DAY OF SURGERY WITH SIP OF WATER:Synthroid, aWellbutrin, Celexa, Pantoprazole, Advair   DO NOT WEAR JEWELRY, EYE MAKEUP, LIPSTICK OR DARK FINGERNAIL POLISH DO NOT WEAR LOTIONS  DO NOT SHAVE FOR 48 HOURS PRIOR TO SURGERY  YOU WILL NOT BE ALLOWED TO DRIVE YOURSELF HOME.  NAME OF DRIVER:Mike Larinda Buttery

## 2011-10-09 NOTE — Progress Notes (Signed)
Addended by: Osborn Coho on: 10/09/2011 08:10 AM   Modules accepted: Orders

## 2011-10-10 NOTE — Telephone Encounter (Signed)
Triage/epic 

## 2011-10-11 NOTE — Telephone Encounter (Signed)
Had spoken with patient at pre-op visit about using a compounded form of vaginal estrogen for approximately $34 however, this cost was also greater than she was able to pay.  Will defer to Dr. Su Hilt for alternatives.   Nadea Kirkland, PA-C

## 2011-10-12 MED ORDER — GENTAMICIN SULFATE 40 MG/ML IJ SOLN
INTRAVENOUS | Status: AC
  Administered 2011-10-13: 100 mL via INTRAVENOUS
  Filled 2011-10-12: qty 2.5

## 2011-10-13 ENCOUNTER — Telehealth: Payer: Self-pay | Admitting: Obstetrics and Gynecology

## 2011-10-13 ENCOUNTER — Encounter (HOSPITAL_COMMUNITY): Payer: Self-pay | Admitting: *Deleted

## 2011-10-13 ENCOUNTER — Encounter (HOSPITAL_COMMUNITY)
Admission: RE | Disposition: A | Discharge status: Home / Self Care | Payer: Self-pay | Source: Ambulatory Visit | Attending: Obstetrics and Gynecology

## 2011-10-13 ENCOUNTER — Inpatient Hospital Stay (HOSPITAL_COMMUNITY): Payer: Medicare Other

## 2011-10-13 ENCOUNTER — Encounter (HOSPITAL_COMMUNITY): Payer: Self-pay

## 2011-10-13 ENCOUNTER — Ambulatory Visit (HOSPITAL_COMMUNITY)
Admission: RE | Admit: 2011-10-13 | Discharge: 2011-10-14 | Disposition: A | Discharge status: Home / Self Care | Payer: Medicare Other | Source: Ambulatory Visit | Attending: Obstetrics and Gynecology | Admitting: Obstetrics and Gynecology

## 2011-10-13 DIAGNOSIS — N3946 Mixed incontinence: Secondary | ICD-10-CM | POA: Insufficient documentation

## 2011-10-13 DIAGNOSIS — Z01812 Encounter for preprocedural laboratory examination: Secondary | ICD-10-CM | POA: Insufficient documentation

## 2011-10-13 DIAGNOSIS — N393 Stress incontinence (female) (male): Secondary | ICD-10-CM

## 2011-10-13 DIAGNOSIS — Z01818 Encounter for other preprocedural examination: Secondary | ICD-10-CM | POA: Insufficient documentation

## 2011-10-13 DIAGNOSIS — N3642 Intrinsic sphincter deficiency (ISD): Secondary | ICD-10-CM

## 2011-10-13 HISTORY — PX: CYSTOSCOPY: SHX5120

## 2011-10-13 HISTORY — PX: BLADDER SUSPENSION: SHX72

## 2011-10-13 SURGERY — URETHROPEXY, USING TRANSVAGINAL TAPE
Anesthesia: General | Wound class: Clean Contaminated

## 2011-10-13 MED ORDER — VASOPRESSIN 20 UNIT/ML IJ SOLN
INTRAVENOUS | Status: DC | PRN
  Administered 2011-10-13: 10:00:00 via INTRAMUSCULAR

## 2011-10-13 MED ORDER — DEXAMETHASONE SODIUM PHOSPHATE 10 MG/ML IJ SOLN
INTRAMUSCULAR | Status: AC
  Filled 2011-10-13: qty 1

## 2011-10-13 MED ORDER — CITALOPRAM HYDROBROMIDE 40 MG PO TABS
60.0000 mg | ORAL_TABLET | Freq: Every day | ORAL | Status: DC
  Administered 2011-10-14: 60 mg via ORAL
  Filled 2011-10-13 (×2): qty 1

## 2011-10-13 MED ORDER — ONDANSETRON HCL 4 MG/2ML IJ SOLN
INTRAMUSCULAR | Status: AC
  Filled 2011-10-13: qty 2

## 2011-10-13 MED ORDER — MIDAZOLAM HCL 5 MG/5ML IJ SOLN
INTRAMUSCULAR | Status: DC | PRN
  Administered 2011-10-13: 2 mg via INTRAVENOUS

## 2011-10-13 MED ORDER — FENTANYL CITRATE 0.05 MG/ML IJ SOLN
INTRAMUSCULAR | Status: AC
  Filled 2011-10-13: qty 5

## 2011-10-13 MED ORDER — OXYCODONE-ACETAMINOPHEN 5-325 MG PO TABS
1.0000 | ORAL_TABLET | ORAL | Status: DC | PRN
  Administered 2011-10-14 (×2): 2 via ORAL
  Filled 2011-10-13 (×2): qty 2

## 2011-10-13 MED ORDER — EPHEDRINE SULFATE 50 MG/ML IJ SOLN
INTRAMUSCULAR | Status: DC | PRN
  Administered 2011-10-13: 10 mg via INTRAVENOUS
  Administered 2011-10-13: 20 mg via INTRAVENOUS
  Administered 2011-10-13: 10 mg via INTRAVENOUS

## 2011-10-13 MED ORDER — LEVOTHYROXINE SODIUM 175 MCG PO TABS
175.0000 ug | ORAL_TABLET | Freq: Every day | ORAL | Status: DC
  Administered 2011-10-14: 175 ug via ORAL
  Filled 2011-10-13 (×2): qty 1

## 2011-10-13 MED ORDER — PROPOFOL 10 MG/ML IV EMUL
INTRAVENOUS | Status: DC | PRN
  Administered 2011-10-13: 240 mg via INTRAVENOUS

## 2011-10-13 MED ORDER — FENTANYL CITRATE 0.05 MG/ML IJ SOLN
INTRAMUSCULAR | Status: DC | PRN
  Administered 2011-10-13: 50 ug via INTRAVENOUS
  Administered 2011-10-13: 100 ug via INTRAVENOUS

## 2011-10-13 MED ORDER — HYDROMORPHONE 0.3 MG/ML IV SOLN
INTRAVENOUS | Status: DC
  Administered 2011-10-13: 0.2 mg via INTRAVENOUS
  Administered 2011-10-13: 0.399 mg via INTRAVENOUS
  Administered 2011-10-13: 13:00:00 via INTRAVENOUS
  Filled 2011-10-13: qty 25

## 2011-10-13 MED ORDER — LIDOCAINE HCL (CARDIAC) 20 MG/ML IV SOLN
INTRAVENOUS | Status: DC | PRN
  Administered 2011-10-13: 30 mg via INTRAVENOUS

## 2011-10-13 MED ORDER — ESTRADIOL 0.1 MG/GM VA CREA
TOPICAL_CREAM | VAGINAL | Status: DC | PRN
  Administered 2011-10-13: 1 via VAGINAL

## 2011-10-13 MED ORDER — MIDAZOLAM HCL 2 MG/2ML IJ SOLN
INTRAMUSCULAR | Status: AC
  Filled 2011-10-13: qty 2

## 2011-10-13 MED ORDER — NEOSTIGMINE METHYLSULFATE 1 MG/ML IJ SOLN
INTRAMUSCULAR | Status: DC | PRN
  Administered 2011-10-13: 5 mg via INTRAVENOUS

## 2011-10-13 MED ORDER — GLYCOPYRROLATE 0.2 MG/ML IJ SOLN
INTRAMUSCULAR | Status: AC
  Filled 2011-10-13: qty 1

## 2011-10-13 MED ORDER — FLUTICASONE-SALMETEROL 250-50 MCG/DOSE IN AEPB
2.0000 | INHALATION_SPRAY | RESPIRATORY_TRACT | Status: DC | PRN
  Filled 2011-10-13: qty 14

## 2011-10-13 MED ORDER — ESTRADIOL 0.1 MG/GM VA CREA
1.0000 | TOPICAL_CREAM | Freq: Every day | VAGINAL | Status: DC
  Administered 2011-10-13: 1 via VAGINAL
  Filled 2011-10-13: qty 42.5

## 2011-10-13 MED ORDER — DOCUSATE SODIUM 100 MG PO CAPS
300.0000 mg | ORAL_CAPSULE | Freq: Every day | ORAL | Status: DC
  Administered 2011-10-13: 300 mg via ORAL
  Filled 2011-10-13: qty 2
  Filled 2011-10-13: qty 1

## 2011-10-13 MED ORDER — FENTANYL CITRATE 0.05 MG/ML IJ SOLN
INTRAMUSCULAR | Status: AC
  Administered 2011-10-13: 25 ug
  Filled 2011-10-13: qty 2

## 2011-10-13 MED ORDER — ROCURONIUM BROMIDE 100 MG/10ML IV SOLN
INTRAVENOUS | Status: DC | PRN
  Administered 2011-10-13: 10 mg via INTRAVENOUS
  Administered 2011-10-13: 30 mg via INTRAVENOUS

## 2011-10-13 MED ORDER — EPHEDRINE 5 MG/ML INJ
INTRAVENOUS | Status: AC
  Filled 2011-10-13: qty 10

## 2011-10-13 MED ORDER — FUROSEMIDE 20 MG PO TABS
20.0000 mg | ORAL_TABLET | Freq: Every day | ORAL | Status: DC | PRN
  Filled 2011-10-13: qty 2

## 2011-10-13 MED ORDER — DIPHENHYDRAMINE HCL 50 MG/ML IJ SOLN: 12.5000 mg | Freq: Four times a day (QID) | INTRAMUSCULAR | Status: DC | PRN

## 2011-10-13 MED ORDER — IBUPROFEN 600 MG PO TABS: 600.0000 mg | ORAL_TABLET | Freq: Four times a day (QID) | ORAL | Status: DC | PRN

## 2011-10-13 MED ORDER — FENTANYL CITRATE 0.05 MG/ML IJ SOLN: 25.0000 ug | INTRAMUSCULAR | Status: DC | PRN

## 2011-10-13 MED ORDER — PROPOFOL 10 MG/ML IV EMUL
INTRAVENOUS | Status: AC
  Filled 2011-10-13: qty 20

## 2011-10-13 MED ORDER — ROCURONIUM BROMIDE 50 MG/5ML IV SOLN
INTRAVENOUS | Status: AC
  Filled 2011-10-13: qty 1

## 2011-10-13 MED ORDER — VASOPRESSIN 20 UNIT/ML IJ SOLN
INTRAMUSCULAR | Status: AC
  Filled 2011-10-13: qty 1

## 2011-10-13 MED ORDER — ESTRADIOL 0.1 MG/GM VA CREA
TOPICAL_CREAM | VAGINAL | Status: AC
  Filled 2011-10-13: qty 42.5

## 2011-10-13 MED ORDER — MEPERIDINE HCL 25 MG/ML IJ SOLN: 6.2500 mg | INTRAMUSCULAR | Status: DC | PRN

## 2011-10-13 MED ORDER — DEXAMETHASONE SODIUM PHOSPHATE 4 MG/ML IJ SOLN
INTRAMUSCULAR | Status: DC | PRN
  Administered 2011-10-13: 10 mg via INTRAVENOUS

## 2011-10-13 MED ORDER — GLYCOPYRROLATE 0.2 MG/ML IJ SOLN
INTRAMUSCULAR | Status: AC
  Filled 2011-10-13: qty 2

## 2011-10-13 MED ORDER — LACTATED RINGERS IV SOLN
INTRAVENOUS | Status: DC
  Administered 2011-10-13 (×2): via INTRAVENOUS

## 2011-10-13 MED ORDER — SCOPOLAMINE 1 MG/3DAYS TD PT72
MEDICATED_PATCH | TRANSDERMAL | Status: AC
  Administered 2011-10-13: 1.5 mg via TRANSDERMAL
  Filled 2011-10-13: qty 1

## 2011-10-13 MED ORDER — SCOPOLAMINE 1 MG/3DAYS TD PT72
1.0000 | MEDICATED_PATCH | TRANSDERMAL | Status: DC
  Administered 2011-10-13: 1.5 mg via TRANSDERMAL

## 2011-10-13 MED ORDER — STERILE WATER FOR IRRIGATION IR SOLN
Status: DC | PRN
  Administered 2011-10-13: 1000 mL

## 2011-10-13 MED ORDER — SUCCINYLCHOLINE CHLORIDE 20 MG/ML IJ SOLN
INTRAMUSCULAR | Status: AC
  Filled 2011-10-13: qty 10

## 2011-10-13 MED ORDER — INDIGOTINDISULFONATE SODIUM 8 MG/ML IJ SOLN
INTRAMUSCULAR | Status: AC
  Filled 2011-10-13: qty 5

## 2011-10-13 MED ORDER — ONDANSETRON HCL 4 MG/2ML IJ SOLN: 4.0000 mg | Freq: Four times a day (QID) | INTRAMUSCULAR | Status: DC | PRN

## 2011-10-13 MED ORDER — KETOROLAC TROMETHAMINE 30 MG/ML IJ SOLN: 30.0000 mg | Freq: Once | INTRAMUSCULAR | Status: DC

## 2011-10-13 MED ORDER — DIPHENHYDRAMINE HCL 12.5 MG/5ML PO ELIX: 12.5000 mg | ORAL_SOLUTION | Freq: Four times a day (QID) | ORAL | Status: DC | PRN

## 2011-10-13 MED ORDER — NEOSTIGMINE METHYLSULFATE 1 MG/ML IJ SOLN
INTRAMUSCULAR | Status: AC
  Filled 2011-10-13: qty 10

## 2011-10-13 MED ORDER — SODIUM CHLORIDE 0.9 % IJ SOLN: 9.0000 mL | INTRAMUSCULAR | Status: DC | PRN

## 2011-10-13 MED ORDER — SUCCINYLCHOLINE CHLORIDE 20 MG/ML IJ SOLN
INTRAMUSCULAR | Status: DC | PRN
  Administered 2011-10-13: 140 mg via INTRAVENOUS

## 2011-10-13 MED ORDER — ONDANSETRON HCL 4 MG/2ML IJ SOLN
INTRAMUSCULAR | Status: DC | PRN
  Administered 2011-10-13: 4 mg via INTRAVENOUS

## 2011-10-13 MED ORDER — INDIGOTINDISULFONATE SODIUM 8 MG/ML IJ SOLN
INTRAMUSCULAR | Status: DC | PRN
  Administered 2011-10-13: 5 mL via INTRAVENOUS

## 2011-10-13 MED ORDER — NALOXONE HCL 0.4 MG/ML IJ SOLN: 0.4000 mg | INTRAMUSCULAR | Status: DC | PRN

## 2011-10-13 MED ORDER — GLYCOPYRROLATE 0.2 MG/ML IJ SOLN
INTRAMUSCULAR | Status: DC | PRN
  Administered 2011-10-13: 1 mg via INTRAVENOUS
  Administered 2011-10-13: 0.2 mg via INTRAVENOUS

## 2011-10-13 MED ORDER — LIDOCAINE HCL (CARDIAC) 20 MG/ML IV SOLN
INTRAVENOUS | Status: AC
  Filled 2011-10-13: qty 5

## 2011-10-13 MED ORDER — BUPROPION HCL ER (XL) 150 MG PO TB24
150.0000 mg | ORAL_TABLET | Freq: Two times a day (BID) | ORAL | Status: DC
  Administered 2011-10-13 – 2011-10-14 (×2): 150 mg via ORAL
  Filled 2011-10-13 (×4): qty 1

## 2011-10-13 MED ORDER — PANTOPRAZOLE SODIUM 40 MG PO TBEC
40.0000 mg | DELAYED_RELEASE_TABLET | Freq: Two times a day (BID) | ORAL | Status: DC
  Administered 2011-10-13 – 2011-10-14 (×2): 40 mg via ORAL
  Filled 2011-10-13 (×4): qty 1

## 2011-10-13 MED ORDER — PROMETHAZINE HCL 25 MG/ML IJ SOLN: 6.2500 mg | INTRAMUSCULAR | Status: DC | PRN

## 2011-10-13 MED ORDER — KETOROLAC TROMETHAMINE 30 MG/ML IJ SOLN: 15.0000 mg | Freq: Once | INTRAMUSCULAR | Status: AC | PRN

## 2011-10-13 SURGICAL SUPPLY — 36 items
ADH SKN CLS APL DERMABOND .7 (GAUZE/BANDAGES/DRESSINGS) ×2
BLADE SURG 11 STRL SS (BLADE) ×3 IMPLANT
BLADE SURG 15 STRL LF C SS BP (BLADE) ×2 IMPLANT
BLADE SURG 15 STRL SS (BLADE) ×3
CANISTER SUCTION 2500CC (MISCELLANEOUS) ×3 IMPLANT
CATH FOLEY 2WAY SLVR  5CC 18FR (CATHETERS) ×2
CATH FOLEY 2WAY SLVR 5CC 18FR (CATHETERS) ×4 IMPLANT
CLOTH BEACON ORANGE TIMEOUT ST (SAFETY) ×3 IMPLANT
DECANTER SPIKE VIAL GLASS SM (MISCELLANEOUS) IMPLANT
DERMABOND ADVANCED (GAUZE/BANDAGES/DRESSINGS) ×1
DERMABOND ADVANCED .7 DNX12 (GAUZE/BANDAGES/DRESSINGS) ×2 IMPLANT
DRAPE HYSTEROSCOPY (DRAPE) ×3 IMPLANT
DRSG COVADERM PLUS 2X2 (GAUZE/BANDAGES/DRESSINGS) IMPLANT
GAUZE PACKING 2X5 YD STERILE (GAUZE/BANDAGES/DRESSINGS) IMPLANT
GAUZE SPONGE 4X4 16PLY XRAY LF (GAUZE/BANDAGES/DRESSINGS) IMPLANT
GLOVE BIO SURGEON STRL SZ7.5 (GLOVE) ×6 IMPLANT
GLOVE BIOGEL PI IND STRL 7.5 (GLOVE) ×4 IMPLANT
GLOVE BIOGEL PI INDICATOR 7.5 (GLOVE) ×2
GOWN PREVENTION PLUS LG XLONG (DISPOSABLE) ×9 IMPLANT
NDL SPNL 22GX3.5 QUINCKE BK (NEEDLE) IMPLANT
NEEDLE HYPO 22GX1.5 SAFETY (NEEDLE) ×3 IMPLANT
NEEDLE SPNL 22GX3.5 QUINCKE BK (NEEDLE) IMPLANT
NS IRRIG 1000ML POUR BTL (IV SOLUTION) ×3 IMPLANT
PACK VAGINAL WOMENS (CUSTOM PROCEDURE TRAY) ×3 IMPLANT
SET CYSTO W/LG BORE CLAMP LF (SET/KITS/TRAYS/PACK) ×3 IMPLANT
SLING TRANS VAGINAL TAPE (Sling) ×1 IMPLANT
SLING UTERINE/ABD GYNECARE TVT (Sling) ×2 IMPLANT
SUT MNCRL AB 3-0 PS2 27 (SUTURE) IMPLANT
SUT MNCRL AB 4-0 PS2 18 (SUTURE) IMPLANT
SUT VIC AB 0 CT1 27 (SUTURE) ×3
SUT VIC AB 0 CT1 27XBRD ANBCTR (SUTURE) ×2 IMPLANT
SUT VIC AB 2-0 SH 27 (SUTURE) ×24
SUT VIC AB 2-0 SH 27XBRD (SUTURE) ×16 IMPLANT
TOWEL OR 17X24 6PK STRL BLUE (TOWEL DISPOSABLE) ×6 IMPLANT
TRAY FOLEY CATH 14FR (SET/KITS/TRAYS/PACK) ×3 IMPLANT
WATER STERILE IRR 1000ML POUR (IV SOLUTION) ×3 IMPLANT

## 2011-10-13 NOTE — Anesthesia Preprocedure Evaluation (Signed)
Anesthesia Evaluation  Patient identified by MRN, date of birth, ID band  Reviewed: Allergy & Precautions, H&P , NPO status , Patient's Chart, lab work & pertinent test results  Airway Mallampati: II      Dental No notable dental hx.    Pulmonary asthma ,  Used inhaler today   Pulmonary exam normal       Cardiovascular     Neuro/Psych    GI/Hepatic negative GI ROS, Neg liver ROS, GERD-  Poorly Controlled,  Endo/Other  Hypothyroidism Morbid obesity  Renal/GU negative Renal ROS  Female GU complaint     Musculoskeletal   Abdominal (+) + obese,   Peds negative pediatric ROS (+)  Hematology negative hematology ROS (+)   Anesthesia Other Findings   Reproductive/Obstetrics negative OB ROS                           Anesthesia Physical Anesthesia Plan  ASA: III  Anesthesia Plan: General   Post-op Pain Management:    Induction: Intravenous  Airway Management Planned: Oral ETT  Additional Equipment:   Intra-op Plan:   Post-operative Plan: Extubation in OR  Informed Consent: I have reviewed the patients History and Physical, chart, labs and discussed the procedure including the risks, benefits and alternatives for the proposed anesthesia with the patient or authorized representative who has indicated his/her understanding and acceptance.   Dental advisory given  Plan Discussed with: CRNA and Surgeon  Anesthesia Plan Comments:         Anesthesia Quick Evaluation

## 2011-10-13 NOTE — Anesthesia Procedure Notes (Signed)
Procedure Name: Intubation Date/Time: 10/13/2011 9:40 AM Performed by: Lincoln Brigham Pre-anesthesia Checklist: Patient identified, Patient being monitored, Emergency Drugs available, Timeout performed and Suction available Patient Re-evaluated:Patient Re-evaluated prior to inductionOxygen Delivery Method: Circle system utilized Preoxygenation: Pre-oxygenation with 100% oxygen Intubation Type: IV induction, Rapid sequence and Cricoid Pressure applied Laryngoscope Size: Miller and 2 Grade View: Grade II Tube size: 7.0 mm Number of attempts: 1 Airway Equipment and Method: Stylet and Bite block Placement Confirmation: ETT inserted through vocal cords under direct vision,  breath sounds checked- equal and bilateral,  positive ETCO2 and CO2 detector Secured at: 21 cm Tube secured with: Tape

## 2011-10-13 NOTE — Transfer of Care (Signed)
Immediate Anesthesia Transfer of Care Note  Patient: [name]  Procedure(s) Performed: Procedure(s) (LRB): TRANSVAGINAL TAPE (TVT) PROCEDURE (N/A) CYSTOSCOPY (Bilateral)  Patient Location: PACU  Anesthesia Type: General  Level of Consciousness: awake and alert   Airway & Oxygen Therapy: Patient Spontanous Breathing and Patient connected to nasal cannula oxygen  Post-op Assessment: Report given to PACU RN and Post -op Vital signs reviewed and stable  Post vital signs: Reviewed and stable  Complications: No apparent anesthesia complications

## 2011-10-13 NOTE — Telephone Encounter (Signed)
TVT; Cysto scheduled for 10/13/11 @ 9:15 with AR/EP.  Patient has MCR/BCBS Adrianne Pridgen

## 2011-10-13 NOTE — H&P (View-Only) (Signed)
[name] is a 66 y.o. female G 0 who presents for pre-operative exam for placement of tension free vaginal tape because of mixed urinary incontinence symptoms. For many years patient has suffered incontinence that has worsened in the past 3 years. She has to wear 2 pads that she changes 2-3 times a day to stay dry. Over the years the patient has found if difficult to make it to the bathroom in time and now, due to inability to walk fast, she has even more of a challenge. She denies dysuria, hematuria, or flank pain. She has noticed that as long as she's supine, she can hold her urine for long periods of time. But her incontinence with standing and walking has caused her to have to change her clothes several times a day. Lumax urodynamics were performed April 2013 and confirmed mixed urinary incontinence.  Given the protracted and disruptive nature of patient's symptoms, she has decided to pursue surgical intervention and management with the placement of tension free vaginal tape.  Past Medical History  OB History: G0  GYN History: menarche 66 YO postmenopausal, The patient denies history of sexually transmitted disease. Denies history of abnormal PAP smear Last PAP smear 2012  Medical History: asthma, breast cancer, depression, thyroid, gastroesophageal reflux disease, abdominal hernia, fibromyalgia, degenerative disc disease-lumbar spine, thrombocytopenia, small bowel obstruction, peptic ulcer disease, Paget's disease, eczema, post-traumatic stress disorder, osteopenia and migraines.  Surgical History: 1968-tonsillectomy, 1970-D & C, 1970-lumbar disc surgery, 1997- Right Lumpectomy/Mastectomy, 2006-Left Breast Lumpectomy, 2010-Left Foot Bone Fusion, Multiple knee surgeries, right hand surgery, rotator cuff repair, hernia repair, partial cholecytecomy, thyroidectomy and history of blood transfusions. Admits to severe nausea and vomiting with some anesthesia  Family History: anxiety, dementia, acid  reflux, and substance abuse.  Social History: married, retired; denies tobacco, alcohol or illicit drug use.  Outpatient Encounter Prescriptions as of 10/06/2011   Medication  Sig  Dispense  Refill   .  benzonatate (TESSALON PERLES) 100 MG capsule  Take 200 mg by mouth 3 (three) times daily as needed. For cough     .  buPROPion (WELLBUTRIN XL) 150 MG 24 hr tablet  Take 150 mg by mouth 2 (two) times daily.     .  Calcium Carbonate-Vitamin D 600-125 MG-UNIT TABS  Take 2 tablets by mouth daily.     .  citalopram (CELEXA) 40 MG tablet  Take 60 mg by mouth daily.     Marland Kitchen  docusate sodium (COLACE) 100 MG capsule  Take 300 mg by mouth at bedtime.     .  Fluticasone-Salmeterol (ADVAIR) 250-50 MCG/DOSE AEPB  Inhale 2 puffs into the lungs as needed. For wheezing. Will take 2 puffs before procedure     .  furosemide (LASIX) 40 MG tablet  Take 20-40 mg by mouth daily as needed. For edema     .  GREEN COFFEE BEAN PO  Take by mouth 3 (three) times daily.     Marland Kitchen  levothyroxine (SYNTHROID, LEVOTHROID) 175 MCG tablet  Take 175 mcg by mouth daily.     .  Multiple Vitamins-Minerals (WOMENS 50+ MULTI VITAMIN/MIN PO)  Take 1 tablet by mouth daily.     Marland Kitchen  nystatin ointment (MYCOSTATIN)  Apply 1 application topically as needed. For biopsied area on bottom     .  Omega-3 Fatty Acids (FISH OIL) 1200 MG CAPS  Take 2 capsules by mouth daily.     Marland Kitchen  OVER THE COUNTER MEDICATION  Take 1 tablet by mouth daily.  occuvision support. 1 tablet daily     .  OVER THE COUNTER MEDICATION  Take 125 mg by mouth 3 (three) times daily.     .  pantoprazole (PROTONIX) 40 MG tablet  Take 40 mg by mouth 2 (two) times daily.     .  Polyethylene Glycol 3350 (MIRALAX PO)  Take 34 g by mouth daily.      Allergies   Allergen  Reactions   .  Diazepam  Other (See Comments)     Knocks me out   .  Tape      "thick clear plastic tape" causes blisters   .  Morphine And Related  Itching and Rash   .  Penicillins  Rash   Denies sensitivity to  shellfish, peanuts or soy.  ZOX:WRUEA glasses, has a removable partial plate in lower front jaw has frequent coughing, various arthralgias due to previous surgeries and arthritis,; denies headache with vision changes, dysphagia, chest pain, shortness of breath, nausea, vomiting or diarrhea, constipation, dysuria, hematuria, vaginitis symptoms, vaginal bleeding or skin rashes. Except as is mentioned in HPI, patient's review of systems is negative.  Physical Exam   BP 120/78  Pulse 74  Temp 98.2 F (36.8 C)  Resp 16  Ht 5\' 5"  (1.651 m)  Wt 249 lb (112.946 kg)  BMI 41.44 kg/m2  Neck: supple no masses  Lungs: clear  Heart:regular rate and rhythym  Abdomen: soft, non-tender  Pelvic:patient refused; 08/01/11 exam showed no cystocele/retocele/uterine prolapse  Extremities: no clubbing or cyanosis but patient has chronic lower extremity edema  Assesment: Mixed Urinary Incontinence  Disposition: A discussion was held with patient regarding the indication for her procedure(s) along with the risks, which include but are not limited to: reaction to anesthesia, damage to adjacent organs, infection, excessive bleeding. worsening of symptoms and erosion of tension free vaginal tape. (Patient's husband was also present for review of risks. Patient verbalized understanding of risks and has consented to proceed with placement of tension free vaginal tape at [facility] on 10/13/11 at 9:15 a.m.  CSN# [id number]  Kasheem Toner J. Lowell Guitar, PA-C for Dr. Su Hilt

## 2011-10-13 NOTE — Interval H&P Note (Signed)
History and Physical Interval Note: see complete H&P by Henreitta Leber, PA-C  10/13/2011 9:29 AM  [name]  has presented today for surgery, with the diagnosis of Urinary Incontinence  The various methods of treatment have been discussed with the patient and family. After consideration of risks, benefits and other options for treatment, the patient has consented to  Procedure(s) (LRB): TRANSVAGINAL TAPE (TVT) PROCEDURE (N/A) CYSTOSCOPY (Bilateral) as a surgical intervention .  The patients' history has been reviewed, patient examined, no change in status, stable for surgery.  I have reviewed the patients' chart and labs.  Questions were answered to the patient's satisfaction.     Pradyun Ishman Y  Pt was unable to get estrogen cream secondary to cost.  May send her home with some to use 2x/wk for 6wks.  Approved by MD that follows her for her h/o breast ca who thought it would be ok temporarily.

## 2011-10-13 NOTE — Op Note (Signed)
Preop Diagnosis: SUI  Postop Diagnosis: SUI  Procedure:1.TVT 2. Cystoscopy  Complications:none  Procedure:The patient was taken to the operating room after the risks, benefits and alternatives were discussed with patient, the patient verbalized understanding and consent signed and witnessed. The patient was placed under general anesthesia and prepped and draped in the normal sterile fashion in the dorsal lithotomy position. A weighted speculum was placed in the patient's vagina and the anterior vaginal wall was injected with dilute pitressin at a concentration of 20 units of pitressin in a total of 100cc of normal saline.  An incision was made in the anterior wall of the vagina for approximately 1cm beneath the midurethra and the underlying tissue was dissected away from the anterior vaginal wall down to the level of the lower symphysis pubis bilaterally. Attention was then turned to the mons pubis where two 5 mm incisions were made 2 fingerbreadths from the midline. The transabdominal guide was then passed through the mons pubis incision on the patient's right down through the space of Retzius and out through the anterior vaginal wall after deflecting the rigid urethral catheter guide to the ipsilateral side. The same was done on the contralateral side. Cystoscopy was performed and no invadvertant bladder injury was noted. The bladder was drained with a Foley and while deflecting the rigid urethral catheter guide to the patient's right, the mesh was attached to the transabdominal guide and elevated up through the space of Retzius and out through the incision on the mons pubis on the ipsilateral side. The same was done on the contralateral side. Cystoscopy was performed again and no inadvertant bladder injury was noted. The 52 French Foley was left in the urethra and a large Tresa Endo was placed between the urethra and the mesh in order to leave the mesh slack beneath the midurethra. The mesh was then cut flush  with the skin at the mons pubis incisions bilaterally. Indigo carmine had been administered, cystoscopy was performed again and bilateral ureters were noted to efflux without difficulty. The bilateral incisions on the mons pubis were then cleaned and Dermabond applied. The anterior vaginal wall incision was repaired with 2-0 vicryl with running interlocking stitch.  Vagina was packed with estrogen soaked vaginal packing.  Sponge, lap and needle count was correct.  The patient tolerated the procedure well and was returned to the recovery room in good condition.

## 2011-10-14 LAB — CBC
HCT: 33.1 % — ABNORMAL LOW (ref 36.0–46.0)
Hemoglobin: 10.9 g/dL — ABNORMAL LOW (ref 12.0–15.0)
MCHC: 32.9 g/dL (ref 30.0–36.0)
MCV: 89 fL (ref 78.0–100.0)

## 2011-10-14 MED ORDER — ESTRADIOL 0.1 MG/GM VA CREA
1.0000 g | TOPICAL_CREAM | VAGINAL | Status: DC
  Filled 2011-10-14: qty 42.5

## 2011-10-14 MED ORDER — CIPROFLOXACIN HCL 250 MG PO TABS: 250.0000 mg | ORAL_TABLET | Freq: Two times a day (BID) | ORAL | Status: AC

## 2011-10-14 MED ORDER — OXYCODONE-ACETAMINOPHEN 5-325 MG PO TABS: 1.0000 | ORAL_TABLET | ORAL | Status: DC | PRN

## 2011-10-14 MED ORDER — IBUPROFEN 600 MG PO TABS: 600.0000 mg | ORAL_TABLET | Freq: Four times a day (QID) | ORAL | Status: AC | PRN

## 2011-10-14 NOTE — Anesthesia Postprocedure Evaluation (Signed)
Anesthesia Post Note  Patient: [name]  Procedure(s) Performed: Procedure(s) (LRB): TRANSVAGINAL TAPE (TVT) PROCEDURE (N/A) CYSTOSCOPY (Bilateral)  Anesthesia type: General  Patient location: PACU  Post pain: Pain level controlled  Post assessment: Post-op Vital signs reviewed  Last Vitals:  Filed Vitals:   10/14/11 0620  BP: 104/68  Pulse: 86  Temp: 36.9 C  Resp: 18    Post vital signs: Reviewed  Level of consciousness: sedated  Complications: No apparent anesthesia complicationsfj

## 2011-10-14 NOTE — Anesthesia Postprocedure Evaluation (Signed)
  Anesthesia Post-op Note  Patient: [name]  Procedure(s) Performed: Procedure(s) (LRB): TRANSVAGINAL TAPE (TVT) PROCEDURE (N/A) CYSTOSCOPY (Bilateral)  Patient Location: A-ICU  Anesthesia Type: General  Level of Consciousness: awake  Airway and Oxygen Therapy: Patient Spontanous Breathing  Post-op Pain: mild  Post-op Assessment: Post-op Vital signs reviewed  Post-op Vital Signs: Reviewed and stable  Complications: No apparent anesthesia complications

## 2011-10-14 NOTE — Progress Notes (Cosign Needed)
   1 Day Post-Op Procedure(s) (LRB): TRANSVAGINAL TAPE (TVT) PROCEDURE (N/A) CYSTOSCOPY (Bilateral)  Subjective: Patient reports that pain is well managed.  Tolerating regular diet without difficulty. No nausea / vomiting., ambulating but has not voided yet.  Objective: BP 104/68  Pulse 86  Temp(Src) 98.4 F (36.9 C) (Oral)  Resp 18  Ht 5\' 5"  (1.651 m)  Wt 242 lb (109.77 kg)  BMI 40.27 kg/m2  SpO2 97% Lungs: clear Heart: normal rate and rhythm Abdomen:soft and appropriately tender Extremities: Homans sign is negative, no sign of DVT, SCD hose in place Incision:intact without evidence of infection Perineal pad: clean  Assessment: s/p Procedure(s): TRANSVAGINAL TAPE (TVT) PROCEDURE CYSTOSCOPY: stable and anemia  Plan: Discharge home Once patient voids without difficulty  LOS: 1 day    Dahiana Kulak, PA-C 10/14/2011, 7:53 AM

## 2011-10-14 NOTE — Progress Notes (Signed)
Foley catheter and vaginal packing removed at 6:00am.  At 10:00am pt unable to void after 3 attempts between 8:45am and 10:00am.  PVR via bladder scanner.  Phoned Dr. Su Hilt with update.  Instructed by Dr Su Hilt to I&O catheterize pt now and do another 4 hour voiding trial.  Updated pt who was amenable to plan.

## 2011-10-14 NOTE — Discharge Summary (Signed)
Physician Discharge Summary  Patient ID: [name] MRN: [id number] DOB/AGE: 1946/02/09 66 y.o.  Admit date: 10/13/2011 Discharge date: 10/14/2011  Admission Diagnoses: Mixed Urinary Incontinence  Discharge Diagnoses: Mixed Urinary Incontinence Active Problems:  * No active hospital problems. *    Discharged Condition: stable  Hospital Course: On the date of admission the patient underwent placement of tension free vaginal tape and cystoscopy, tolerating procedure well.  Post operative course was unremarkable with patient tolerating a Hgb of 10.9. By post operative day #1 the patient had resumed bowel and bladder function and was therefore deemed ready for discharge home.    Disposition: 01-Home or Self Care  Discharge Orders    Future Appointments: Provider: Department: Dept Phone: Center:   11/24/2011 11:45 AM Purcell Nails, MD [facility] [phone] None     Future Orders Please Complete By Expires   Discharge patient        Medication List  As of 10/14/2011  8:15 AM   TAKE these medications         buPROPion 150 MG 24 hr tablet   Commonly known as: WELLBUTRIN XL   Take 150 mg by mouth 2 (two) times daily.      Calcium Carbonate-Vitamin D 600-125 MG-UNIT Tabs   Take 2 tablets by mouth daily.      ciprofloxacin 250 MG tablet   Commonly known as: CIPRO   Take 1 tablet (250 mg total) by mouth 2 (two) times daily.      citalopram 40 MG tablet   Commonly known as: CELEXA   Take 60 mg by mouth daily.      docusate sodium 100 MG capsule   Commonly known as: COLACE   Take 300 mg by mouth at bedtime.      Fish Oil 1200 MG Caps   Take 2 capsules by mouth daily.      Fluticasone-Salmeterol 250-50 MCG/DOSE Aepb   Commonly known as: ADVAIR   Inhale 2 puffs into the lungs as needed. For wheezing. Will take 2 puffs before procedure      furosemide 40 MG tablet   Commonly known as: LASIX   Take 20-40 mg by mouth daily as needed. For edema      GREEN COFFEE BEAN  PO   Take by mouth 3 (three) times daily.      ibuprofen 600 MG tablet   Commonly known as: ADVIL,MOTRIN   Take 1 tablet (600 mg total) by mouth every 6 (six) hours as needed for pain (mild pain).      levothyroxine 175 MCG tablet   Commonly known as: SYNTHROID, LEVOTHROID   Take 175 mcg by mouth daily.      MIRALAX PO   Take 34 g by mouth daily.      nystatin ointment   Commonly known as: MYCOSTATIN   Apply 1 application topically as needed. For biopsied area on bottom      OVER THE COUNTER MEDICATION   Take 1 tablet by mouth daily. occuvision support. 1 tablet daily      OVER THE COUNTER MEDICATION   Take 125 mg by mouth 3 (three) times daily.      oxyCODONE-acetaminophen 5-325 MG per tablet   Commonly known as: PERCOCET   Take 1-2 tablets by mouth every 3 (three) hours as needed (moderate to severe pain (when tolerating fluids)).      pantoprazole 40 MG tablet   Commonly known as: PROTONIX   Take 40 mg by mouth 2 (two)  times daily.      TESSALON PERLES 100 MG capsule   Generic drug: benzonatate   Take 200 mg by mouth 3 (three) times daily as needed. For cough      WOMENS 50+ MULTI VITAMIN/MIN PO   Take 1 tablet by mouth daily.         ASK your doctor about these medications         Estradiol 10 MCG Tabs (Pt will be using vaginal cream 2x/wk until postop)   Place 1 tablet (10 mcg total) vaginally 1 day or 1 dose. 1 tablet in vagina every other day for 10 days then twice weekly           Follow-up Information    Follow up with Purcell Nails, MD on 11/24/2011. (appointment time 11:45 am)    Contact information:   [address] [place] [address] [phone]          Signed: Patrick Jupiter 10/14/2011, 8:15 AM

## 2011-10-14 NOTE — Addendum Note (Signed)
Addendum  created 10/14/11 1108 by Algis Greenhouse, CRNA   Modules edited:Notes Section

## 2011-10-14 NOTE — Progress Notes (Addendum)
Voiding trials:  10:00am   Vd:  0    PVR via [place]:  377    I&O Cath:  325 12:30pm   Vd: 150   PVR via [place]:  25   I&O Cath:  No done 14:00pm   Vd:  100   PVR via [place]:  747  I&O Cath: 750  Dr. Su Hilt updated via telephone with above and pt discharged by Wyvonna Plum, RN.  Condition stable - pt home with foley catheter and leg bag.  Pt to car via wheelchair with E. Pinion, NT.  No other equipment for home ordered at discharge.

## 2011-10-14 NOTE — Discharge Instructions (Signed)
Call [facility] @ [phone] if:  You have a temperature greater than or equal to 100.4 degrees Farenheit orally You have pain that is not made better by the pain medication given and taken as directed You have excessive bleeding or problems urinating  Apply estrogen vaginal cream with finger to vaginal area every other day for 14 days then twice weekly until your post operatiive  visit  Take Colace (Docusate Sodium/Stool Softener) 100 mg 2-3 times daily while taking narcotic pain medicine to avoid constipation or until bowel movements are regular.  You may walk up steps You may shower tomorrow You may resume a regular diet Keep incisions clean and dry Avoid anything in vagina for 6 weeks (or until after your post-operative visit)   Follow up with Dr. Su Hilt November 24, 2011 @ 11:45 am

## 2011-10-15 ENCOUNTER — Telehealth: Payer: Self-pay

## 2011-10-15 NOTE — Telephone Encounter (Signed)
Spoke to pt to get her sched for  appt 10/22/2011 @ 9 am for catheter removal. Levin Erp

## 2011-10-22 ENCOUNTER — Encounter: Payer: Self-pay | Admitting: Obstetrics and Gynecology

## 2011-10-22 ENCOUNTER — Ambulatory Visit (INDEPENDENT_AMBULATORY_CARE_PROVIDER_SITE_OTHER): Payer: Medicare Other | Admitting: Obstetrics and Gynecology

## 2011-10-22 VITALS — BP 118/66 | Resp 16 | Wt 250.0 lb

## 2011-10-22 DIAGNOSIS — R339 Retention of urine, unspecified: Secondary | ICD-10-CM

## 2011-10-22 DIAGNOSIS — R300 Dysuria: Secondary | ICD-10-CM

## 2011-10-22 LAB — POCT URINALYSIS DIPSTICK
Glucose, UA: NEGATIVE
Ketones, UA: NEGATIVE
Spec Grav, UA: 1.015

## 2011-10-22 MED ORDER — BETHANECHOL CHLORIDE 10 MG PO TABS: 10.0000 mg | ORAL_TABLET | Freq: Three times a day (TID) | ORAL | Status: DC

## 2011-10-22 MED ORDER — CLOTRIMAZOLE-BETAMETHASONE 1-0.05 % EX CREA: TOPICAL_CREAM | Freq: Every day | CUTANEOUS | Status: DC

## 2011-10-22 NOTE — Progress Notes (Signed)
DATE OF SURGERY: 10/13/2011 TYPE OF SURGERY:Bladder  PAIN:Yes  VAG BLEEDING: yes VAG DISCHARGE: yes NORMAL GI FUNCTN: yes NORMAL GU FUNCTN: foley in place secondary to urinary retention  C/o burning "down there"  Filed Vitals:   10/22/11 0916  BP: 118/66  Resp: 16   Ext genitalia with excoriated area Vagina healing well Foley removed without difficulty  A/P Pt to return this afternoon TOV in meantime - Upon return pt says she went to bathroom a couple of times.  Voided normally except still feels pressure.  Urine to Cx.  PVR after voiding 66 cc.  Rx for urecholine sent to pharmacy Lotrisone cream for ext genitalia RTO in 1wk for f/u urinary retention s/p TVT now with what appears to be resolution

## 2011-10-23 LAB — URINE CULTURE: Colony Count: 9000

## 2011-10-30 ENCOUNTER — Ambulatory Visit (INDEPENDENT_AMBULATORY_CARE_PROVIDER_SITE_OTHER): Payer: Medicare Other | Admitting: Obstetrics and Gynecology

## 2011-10-30 ENCOUNTER — Encounter: Payer: Self-pay | Admitting: Obstetrics and Gynecology

## 2011-10-30 VITALS — BP 130/70 | Resp 18 | Ht 63.5 in | Wt 245.0 lb

## 2011-10-30 DIAGNOSIS — Z466 Encounter for fitting and adjustment of urinary device: Secondary | ICD-10-CM

## 2011-10-30 DIAGNOSIS — N390 Urinary tract infection, site not specified: Secondary | ICD-10-CM

## 2011-10-30 LAB — POCT URINALYSIS DIPSTICK
Bilirubin, UA: NEGATIVE
Glucose, UA: NEGATIVE
Nitrite, UA: NEGATIVE
Urobilinogen, UA: NEGATIVE

## 2011-10-30 MED ORDER — SULFAMETHOXAZOLE-TRIMETHOPRIM 800-160 MG PO TABS: 1.0000 | ORAL_TABLET | Freq: Two times a day (BID) | ORAL | Status: AC

## 2011-10-30 NOTE — Progress Notes (Signed)
Pt c/o voiiding only small amounts when goes to urinate. S/p TVT with immediate postop urinary retention.  Foley left in for about a week post op to rest bladder.  Catheter has been removed for several day.  Filed Vitals:   10/30/11 1556  BP: 130/70  Resp: 18   Results for orders placed in visit on 10/30/11  POCT URINALYSIS DIPSTICK      Component Value Range   Color, UA       Clarity, UA       Glucose, UA negative     Bilirubin, UA negative     Ketones, UA negative     Spec Grav, UA 1.015     Blood, UA 50     pH, UA       Protein, UA negative     Urobilinogen, UA negative     Nitrite, UA negative     Leukocytes, UA moderate (2+)      PVR 15cc  A/P Probable UTI vs Retention Bactrim (pt had Cipro post op) CC Urine Cx If problem persists, may need to return to OR to loosen or replace TVT Pt instructed to call office if still feeling no relief by 2p tomorrow

## 2011-10-31 ENCOUNTER — Other Ambulatory Visit: Payer: Self-pay | Admitting: Obstetrics and Gynecology

## 2011-10-31 ENCOUNTER — Telehealth: Payer: Self-pay | Admitting: Obstetrics and Gynecology

## 2011-10-31 NOTE — Telephone Encounter (Signed)
PER MSG BELOW, PT ADVISED TO STOP MED, MED LIST UPDATED TO REFLECT THAT.

## 2011-10-31 NOTE — Telephone Encounter (Signed)
Message copied by Delon Sacramento on Fri Oct 31, 2011  9:19 AM ------      Message from: Osborn Coho      Created: Thu Oct 30, 2011  6:24 PM       Please contact pt and make sure she is no longer taking urecholine (Bethanecol) and if she is tell her to stop it immediately.  Update med list.  Thanks

## 2011-11-01 LAB — URINE CULTURE
Colony Count: NO GROWTH
Organism ID, Bacteria: NO GROWTH

## 2011-11-03 ENCOUNTER — Telehealth: Payer: Self-pay

## 2011-11-03 NOTE — Telephone Encounter (Signed)
Spoke to pt to see how she's doing and she states shes putting out a lot of urine. Usually about every 15-20 mins. Feeling good, drinking plenty of water. No complaints. Melody Comas A

## 2011-11-17 ENCOUNTER — Telehealth: Payer: Self-pay | Admitting: Obstetrics and Gynecology

## 2011-11-17 NOTE — Telephone Encounter (Signed)
Pt just called to ask if EP, Dr. Su Hilt or Rivard wanted some vegetables. No medical needs. [name]

## 2011-11-24 ENCOUNTER — Encounter: Payer: Medicare Other | Admitting: Obstetrics and Gynecology

## 2011-12-11 ENCOUNTER — Ambulatory Visit (INDEPENDENT_AMBULATORY_CARE_PROVIDER_SITE_OTHER): Payer: Medicare Other | Admitting: Obstetrics and Gynecology

## 2011-12-11 ENCOUNTER — Encounter: Payer: Self-pay | Admitting: Obstetrics and Gynecology

## 2011-12-11 VITALS — BP 110/70 | Ht 65.0 in | Wt 244.0 lb

## 2011-12-11 DIAGNOSIS — Z719 Counseling, unspecified: Secondary | ICD-10-CM

## 2011-12-11 DIAGNOSIS — N3946 Mixed incontinence: Secondary | ICD-10-CM

## 2011-12-11 MED ORDER — TOLTERODINE TARTRATE ER 4 MG PO CP24: 4.0000 mg | ORAL_CAPSULE | Freq: Every day | ORAL | Status: DC

## 2011-12-11 NOTE — Progress Notes (Signed)
DATE OF SURGERY: 10/13/2011 TYPE OF SURGERY:TVT/Cystoscopy PAIN:No VAG BLEEDING: no VAG DISCHARGE: no NORMAL GI FUNCTN: yes NORMAL GU FUNCTN: yes  C/o OAB sxs and having to sit on toilet while to empty bladder completely.  Nl PVR at last visit.  Filed Vitals:   12/11/11 1454  BP: 110/70   ROS: noncontributory  Pelvic exam:  VULVA: normal appearing vulva with no masses, tenderness or lesions,  VAGINA: normal appearing vagina with normal color and discharge, no lesions, well healed CERVIX: normal appearing cervix without discharge or lesions,  UTERUS: uterus is normal size, shape, consistency and nontender,  ADNEXA: normal adnexa in size, nontender and no masses.  A/P UCx Detrol La RTO 6wks

## 2011-12-13 LAB — URINE CULTURE: Colony Count: NO GROWTH

## 2011-12-18 ENCOUNTER — Telehealth: Payer: Self-pay | Admitting: Obstetrics and Gynecology

## 2011-12-19 ENCOUNTER — Telehealth: Payer: Self-pay

## 2011-12-19 ENCOUNTER — Other Ambulatory Visit: Payer: Self-pay

## 2011-12-19 DIAGNOSIS — N3946 Mixed incontinence: Secondary | ICD-10-CM

## 2011-12-19 MED ORDER — SOLIFENACIN SUCCINATE 5 MG PO TABS: ORAL_TABLET | ORAL | Status: DC

## 2011-12-19 NOTE — Telephone Encounter (Signed)
Spoke to pt to let her know I told AR about the Detrol LA being a $200.00 out of pocket cost. Per Dr. Su Hilt, we will e- scribe  Vesicare 5 mg instead to take 1-2 po qd # 30 w/ 1 RF instead. It will be  $ 43.00 cost out of pocket. Melody Comas A

## 2012-01-20 ENCOUNTER — Encounter: Payer: Medicare Other | Admitting: Obstetrics and Gynecology

## 2012-01-27 ENCOUNTER — Telehealth (INDEPENDENT_AMBULATORY_CARE_PROVIDER_SITE_OTHER): Payer: Self-pay | Admitting: General Surgery

## 2012-01-27 NOTE — Telephone Encounter (Signed)
Patient calling status post fall last Monday through her screen door. She is calling our office because she had a mastectomy in 1997 with axillary node dissection. She is having some pain in her back/axilla area. She doesn't report swelling or any cuts on arm/axilla. I advised her to follow up with PCP and they would contact our office if we need to see patient for this. She will call with any additional problems.

## 2012-03-13 ENCOUNTER — Emergency Department (HOSPITAL_COMMUNITY): Payer: Medicare Other

## 2012-03-13 ENCOUNTER — Emergency Department (HOSPITAL_COMMUNITY)
Admission: EM | Admit: 2012-03-13 | Discharge: 2012-03-13 | Disposition: A | Discharge status: Home / Self Care | Payer: Medicare Other | Attending: Emergency Medicine | Admitting: Emergency Medicine

## 2012-03-13 ENCOUNTER — Encounter (HOSPITAL_COMMUNITY): Payer: Self-pay | Admitting: Emergency Medicine

## 2012-03-13 DIAGNOSIS — M25519 Pain in unspecified shoulder: Secondary | ICD-10-CM | POA: Insufficient documentation

## 2012-03-13 DIAGNOSIS — S1093XA Contusion of unspecified part of neck, initial encounter: Secondary | ICD-10-CM | POA: Insufficient documentation

## 2012-03-13 DIAGNOSIS — M79609 Pain in unspecified limb: Secondary | ICD-10-CM | POA: Insufficient documentation

## 2012-03-13 DIAGNOSIS — M899 Disorder of bone, unspecified: Secondary | ICD-10-CM | POA: Insufficient documentation

## 2012-03-13 DIAGNOSIS — M25569 Pain in unspecified knee: Secondary | ICD-10-CM | POA: Insufficient documentation

## 2012-03-13 DIAGNOSIS — E039 Hypothyroidism, unspecified: Secondary | ICD-10-CM | POA: Insufficient documentation

## 2012-03-13 DIAGNOSIS — W010XXA Fall on same level from slipping, tripping and stumbling without subsequent striking against object, initial encounter: Secondary | ICD-10-CM | POA: Insufficient documentation

## 2012-03-13 DIAGNOSIS — Z96659 Presence of unspecified artificial knee joint: Secondary | ICD-10-CM | POA: Insufficient documentation

## 2012-03-13 DIAGNOSIS — M949 Disorder of cartilage, unspecified: Secondary | ICD-10-CM | POA: Insufficient documentation

## 2012-03-13 DIAGNOSIS — J90 Pleural effusion, not elsewhere classified: Secondary | ICD-10-CM | POA: Insufficient documentation

## 2012-03-13 DIAGNOSIS — R110 Nausea: Secondary | ICD-10-CM | POA: Insufficient documentation

## 2012-03-13 DIAGNOSIS — T148XXA Other injury of unspecified body region, initial encounter: Secondary | ICD-10-CM | POA: Insufficient documentation

## 2012-03-13 DIAGNOSIS — Z853 Personal history of malignant neoplasm of breast: Secondary | ICD-10-CM | POA: Insufficient documentation

## 2012-03-13 DIAGNOSIS — S0003XA Contusion of scalp, initial encounter: Secondary | ICD-10-CM | POA: Insufficient documentation

## 2012-03-13 MED ORDER — TRAMADOL HCL 50 MG PO TABS: 50.0000 mg | ORAL_TABLET | Freq: Four times a day (QID) | ORAL | Status: DC | PRN

## 2012-03-13 MED ORDER — PROMETHAZINE HCL 25 MG PO TABS: 25.0000 mg | ORAL_TABLET | Freq: Four times a day (QID) | ORAL | Status: DC | PRN

## 2012-03-13 MED ORDER — PROMETHAZINE HCL 25 MG/ML IJ SOLN
12.5000 mg | Freq: Once | INTRAMUSCULAR | Status: AC
  Administered 2012-03-13: 12.5 mg via INTRAVENOUS
  Filled 2012-03-13 (×2): qty 1

## 2012-03-13 NOTE — ED Notes (Signed)
RUE:AV40<JW> Expected date:03/13/12<BR> Expected time: 2:37 PM<BR> Means of arrival:<BR> Comments:<BR> Fall shoulder injury

## 2012-03-13 NOTE — ED Notes (Signed)
Patient transported to CT and X ray 

## 2012-03-13 NOTE — ED Provider Notes (Addendum)
History     CSN: [id number]  Arrival date & time 03/13/12  1445   First MD Initiated Contact with Patient 03/13/12 1520      Chief Complaint  Patient presents with  . Fall  . Arm Pain  . Knee Pain    (Consider location/radiation/quality/duration/timing/severity/associated sxs/prior treatment) The history is provided by the patient and the spouse.   the patient is a 66 year old, female, with a history of bilateral knee replacements, who presents to emergency department complaining of a contusion to her left forehead, along with right shoulder pain, right humerus pain.  Bilateral knee pain, and bilateral lower extremity pain.  She was walking down an incline and stumbled and fell forward.  She struck the left side of her forehead, and reached to brace herself from the fall.  She pulled her right arm, and landed on her knees.  She denies loss of consciousness.  She denies neck pain, or back pain.  She denies chest pain, or abdominal pain. She was nauseated, but denies vomiting.  She denies weakness, or paresthesias.  She was not able to stand up after the fall.  She denies taking anticoagulants.  She was given analgesics, and antiemetics, by the ambulance and denies pain, or nausea.  At this time.  Past Medical History  Diagnosis Date  . Cancer   . Asthma   . Blood transfusion   . GERD (gastroesophageal reflux disease)   . Thyroid disease   . Allergy   . Hernia   . PONV (postoperative nausea and vomiting)   . Breast cancer, stage 2 05/28/2011  . 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 Hypothyroid 05/28/2011  . GERD (gastroesophageal reflux disease) 05/28/2011  . Fibromyalgia 05/28/2011  . 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 Mild depression 05/28/2011  . Thrombocytopenia, unspecified 05/28/2011  . Hx of migraines     Past Surgical History  Procedure Date  . Breast surgery   . Breast lumpectomy   . Mastectomy 1997    right breast  .  Cholecystectomy   . Knee surgery     x5  . Joint replacement     right knee x2  . Joint replacement     left knee x5  . Back surgery   . Foot surgery     fused left foot  . Tonsillectomy 68  . Thyroid surgery   . Hand tendon surgery     right hand tendon tear  . Rotator cuff repair     right rotator cuff  . Thyroidectomy   . Incisional hernia repair 05/05/2011    Procedure: LAPAROSCOPIC INCISIONAL HERNIA;  Surgeon: Mariella Saa, MD;  Location: [facility];  Service: General;  Laterality: N/A;  LAPAROSCOPIC REPAIR INCARCERATED INCISIONAL HERNIA  WITH MESH  . Hernia repair 05/05/11    emergency VH and incisional hernia repair   . Bladder suspension 10/13/2011    Procedure: TRANSVAGINAL TAPE (TVT) PROCEDURE;  Surgeon: Purcell Nails, MD;  Location: [facility];  Service: Gynecology;  Laterality: N/A;  . Cystoscopy 10/13/2011    Procedure: CYSTOSCOPY;  Surgeon: Purcell Nails, MD;  Location: [facility];  Service: Gynecology;  Laterality: Bilateral;    Family History  Problem Relation Age of Onset  . Cancer Father 4    lung and kidney   . Alzheimer's disease Mother   . Alcohol abuse Brother   . Heart attack Maternal Grandmother     History  Substance Use Topics  .  Smoking status: Former Smoker    Types: Cigarettes    Quit date: 06/05/1999  . Smokeless tobacco: Never Used  . Alcohol Use: No    OB History    Grav Para Term Preterm Abortions TAB SAB Ect Mult Living   1 0              Review of Systems  HENT: Negative for neck pain.   Eyes: Negative for visual disturbance.  Cardiovascular: Negative for chest pain.  Gastrointestinal: Positive for nausea. Negative for vomiting and abdominal pain.  Musculoskeletal: Negative for back pain.  Skin: Negative for wound.  Neurological: Negative for weakness, numbness and headaches.  Hematological: Does not bruise/bleed easily.  Psychiatric/Behavioral: Negative for confusion.  All other systems reviewed and are  negative.    Allergies  Demerol; Diazepam; Tape; Morphine and related; and Penicillins  Home Medications   Current Outpatient Rx  Name Route Sig Dispense Refill  . BUPROPION HCL ER (XL) 150 MG PO TB24 Oral Take 150 mg by mouth 2 (two) times daily.      Marland Kitchen CALCIUM CARBONATE-VITAMIN D 600-125 MG-UNIT PO TABS Oral Take 2 tablets by mouth daily.    Marland Kitchen CITALOPRAM HYDROBROMIDE 40 MG PO TABS Oral Take 60 mg by mouth daily.     Marland Kitchen DOCUSATE SODIUM 100 MG PO CAPS Oral Take 300 mg by mouth at bedtime.    Marland Kitchen GREEN COFFEE BEAN PO Oral Take 1 tablet by mouth daily.     Marland Kitchen LEVOTHYROXINE SODIUM 175 MCG PO TABS Oral Take 175 mcg by mouth daily.    . WOMENS 50+ MULTI VITAMIN/MIN PO Oral Take 1 tablet by mouth daily.    Marland Kitchen FISH OIL 1200 MG PO CAPS Oral Take 2 capsules by mouth daily.     Marland Kitchen OVER THE COUNTER MEDICATION Oral Take 125 mg by mouth 3 (three) times daily.    Marland Kitchen PANTOPRAZOLE SODIUM 40 MG PO TBEC Oral Take 40 mg by mouth 2 (two) times daily.     Marland Kitchen MIRALAX PO Oral Take 34 g by mouth at bedtime.       BP 121/65  Pulse 75  Temp 98 F (36.7 C) (Oral)  Resp 20  SpO2 98%  Physical Exam  Nursing note and vitals reviewed. Constitutional: She is oriented to person, place, and time. No distress.       Morbidly obese  HENT:  Head: Normocephalic.       Approximately 3 cm contusion to left forehead  Eyes: Conjunctivae normal and EOM are normal.  Neck: Normal range of motion. Neck supple.       No neck tenderness  Cardiovascular: Normal rate and regular rhythm.   Pulmonary/Chest: Effort normal and breath sounds normal. No respiratory distress. She exhibits tenderness.       Mild bilateral anterior chest wall tenderness, without crepitance  Abdominal: Soft. Bowel sounds are normal. She exhibits no distension. There is no tenderness.  Musculoskeletal: She exhibits tenderness. She exhibits no edema.       Right upper extremity Tenderness over the shoulder and anterior humerus.  No tenderness from the  elbow to the hand Right lower extremity. Tenderness from the knee to the tibia without swelling, deformity, ecchymoses, or laceration. Left lower extremity. Tenderness from the knee to the anterior tibia without swelling, deformity, ecchymoses, or laceration. Pelvis is stable to compression over the iliac wings and pubic symphysis, with no tenderness   Neurological: She is alert and oriented to person, place, and time. No cranial  nerve deficit.  Skin: Skin is warm and dry.  Psychiatric: She has a normal mood and affect. Thought content normal.    ED Course  Procedures (including critical care time)  Labs Reviewed - No data to display No results found.   No diagnosis found.  5:07 PM Nausea returned. Denies pain.  Discussed xr results and plan.  Pt understands and agrees.   MDM  Fall with left forehead contusion        Cheri Guppy, MD 03/13/12 1558  Cheri Guppy, MD 03/13/12 [phone]

## 2012-03-13 NOTE — ED Notes (Addendum)
Per EMS: Pt fell face first onto a ramp, broke fall by upper extremities. She has contusion on left frontal. Pt complaints of 9/10 pain from right elbow to shoulder. Pt reports 7/10 bilateral knee pain, pt has hx or bilateral knee replacements. Pt found supine. Pt denies neck or back pain. Pt denies LOC.

## 2012-03-13 NOTE — ED Notes (Signed)
Pt returned from Radiology.

## 2012-03-17 ENCOUNTER — Encounter: Payer: Self-pay | Admitting: Obstetrics and Gynecology

## 2012-03-17 ENCOUNTER — Ambulatory Visit (INDEPENDENT_AMBULATORY_CARE_PROVIDER_SITE_OTHER): Payer: Medicare Other | Admitting: Obstetrics and Gynecology

## 2012-03-17 ENCOUNTER — Telehealth: Payer: Self-pay

## 2012-03-17 VITALS — BP 130/72 | Resp 16 | Ht 63.5 in | Wt 254.0 lb

## 2012-03-17 DIAGNOSIS — N318 Other neuromuscular dysfunction of bladder: Secondary | ICD-10-CM

## 2012-03-17 DIAGNOSIS — N760 Acute vaginitis: Secondary | ICD-10-CM

## 2012-03-17 DIAGNOSIS — N3281 Overactive bladder: Secondary | ICD-10-CM

## 2012-03-17 DIAGNOSIS — N762 Acute vulvitis: Secondary | ICD-10-CM

## 2012-03-17 DIAGNOSIS — R300 Dysuria: Secondary | ICD-10-CM

## 2012-03-17 LAB — POCT URINALYSIS DIPSTICK
Bilirubin, UA: NEGATIVE
Glucose, UA: NEGATIVE
Nitrite, UA: NEGATIVE
Spec Grav, UA: <=1.005
Urobilinogen, UA: NEGATIVE

## 2012-03-17 MED ORDER — SOLIFENACIN SUCCINATE 5 MG PO TABS: 10.0000 mg | ORAL_TABLET | Freq: Every day | ORAL | Status: DC

## 2012-03-17 MED ORDER — CLOTRIMAZOLE-BETAMETHASONE 1-0.05 % EX CREA: TOPICAL_CREAM | Freq: Every day | CUTANEOUS | Status: DC

## 2012-03-17 MED ORDER — NITROFURANTOIN MONOHYD MACRO 100 MG PO CAPS: 100.0000 mg | ORAL_CAPSULE | Freq: Two times a day (BID) | ORAL | Status: DC

## 2012-03-17 NOTE — Telephone Encounter (Signed)
Spoke with Genie Physiological scientist) and Selena Batten (pharmacist) regarding generic for [employer] regarding below. Generic was approved per Dr. Alinda Sierras. Genie states Ditropan is generic & cheaper will cost pt $10. Ditropan 5 mg 1 po qd may increase to 2 tabs po qd called to [facility] pharmacist. Pt is thankful and agrees.

## 2012-03-17 NOTE — Telephone Encounter (Signed)
VM from pt requesting solifenacin generic for Vesicare. Pt states pharmacy needs an order before prescribing the generic. Informed pt will consult with provider and call her back before the end of the day. Pt agrees and voices understanding.

## 2012-03-17 NOTE — Addendum Note (Signed)
Addended by: Marla Roe A on: 03/17/2012 12:56 PM   Modules accepted: Orders

## 2012-03-17 NOTE — Progress Notes (Signed)
Here to f/u OAB and starting detrol la  Filed Vitals:   03/17/12 1108  BP: 130/72  Resp: 16   ROS: noncontributory  Pelvic exam:  VULVA: appears inflamed with no masses, tenderness or lesions,  VAGINA: normal appearing vagina with normal color and discharge, no lesions, CERVIX: normal appearing cervix without discharge or lesions,  UTERUS: uterus is normal size, shape, consistency and nontender,  ADNEXA: normal adnexa in size, nontender and no masses.  A/P UCx Trial of macrobid lotrisone for vulvitis RTO 2wks Trial of vesicare

## 2012-03-19 LAB — URINE CULTURE
Colony Count: NO GROWTH
Organism ID, Bacteria: NO GROWTH

## 2012-03-30 ENCOUNTER — Encounter: Payer: Self-pay | Admitting: Obstetrics and Gynecology

## 2012-03-30 ENCOUNTER — Ambulatory Visit (INDEPENDENT_AMBULATORY_CARE_PROVIDER_SITE_OTHER): Payer: Medicare Other | Admitting: Obstetrics and Gynecology

## 2012-03-30 VITALS — BP 110/64 | Ht 65.0 in | Wt 257.0 lb

## 2012-03-30 DIAGNOSIS — C4499 Other specified malignant neoplasm of skin, unspecified: Secondary | ICD-10-CM

## 2012-03-30 HISTORY — DX: Other specified malignant neoplasm of skin, unspecified: C44.99

## 2012-03-30 MED ORDER — OXYBUTYNIN CHLORIDE 5 MG PO TABS: 5.0000 mg | ORAL_TABLET | Freq: Two times a day (BID) | ORAL | Status: DC

## 2012-03-30 NOTE — Progress Notes (Signed)
Pt here to f/u vulva and reports it is a worsening of what she had a yr ago when she was referred to Dr. Linus Mako Vitals:   03/30/12 1158  BP: 110/64   Vulva is less inflammed but still erythematous and irritated  A/p H/o paget's disease Will refer back to gyn onc

## 2012-03-30 NOTE — Addendum Note (Signed)
Addended by: Osborn Coho on: 03/30/2012 12:51 PM   Modules accepted: Orders

## 2012-04-01 ENCOUNTER — Telehealth: Payer: Self-pay

## 2012-04-01 NOTE — Telephone Encounter (Signed)
LM for pt to cb re: referral appt to GYN ONC . Appt set for 04/14/2012 w/ Dr. Cleda Mccreedy. @ 1:30. Pt to arrive 1/2 hr early. Melody Comas A

## 2012-04-01 NOTE — Telephone Encounter (Signed)
Notified pt about referral to Dr Lester [place] for Pagets disease on 04/14/2012 @ 1:30. Melody Comas A

## 2012-04-14 ENCOUNTER — Encounter: Payer: Self-pay | Admitting: Gynecologic Oncology

## 2012-04-14 ENCOUNTER — Ambulatory Visit: Payer: Medicare Other | Attending: Gynecologic Oncology | Admitting: Gynecologic Oncology

## 2012-04-14 VITALS — BP 118/68 | HR 68 | Temp 97.5°F | Resp 18 | Ht 65.0 in | Wt 252.0 lb

## 2012-04-14 DIAGNOSIS — C4499 Other specified malignant neoplasm of skin, unspecified: Secondary | ICD-10-CM

## 2012-04-14 DIAGNOSIS — C519 Malignant neoplasm of vulva, unspecified: Secondary | ICD-10-CM

## 2012-04-14 DIAGNOSIS — K219 Gastro-esophageal reflux disease without esophagitis: Secondary | ICD-10-CM | POA: Insufficient documentation

## 2012-04-14 DIAGNOSIS — Z79899 Other long term (current) drug therapy: Secondary | ICD-10-CM | POA: Insufficient documentation

## 2012-04-14 DIAGNOSIS — IMO0001 Reserved for inherently not codable concepts without codable children: Secondary | ICD-10-CM | POA: Insufficient documentation

## 2012-04-14 DIAGNOSIS — E039 Hypothyroidism, unspecified: Secondary | ICD-10-CM | POA: Insufficient documentation

## 2012-04-14 DIAGNOSIS — Z96659 Presence of unspecified artificial knee joint: Secondary | ICD-10-CM | POA: Insufficient documentation

## 2012-04-14 NOTE — Progress Notes (Signed)
Consult Note: Gyn-Onc  [name] 66 y.o. female  CC:  Chief Complaint  Patient presents with  . Paget Disease    Follow up    HPI: Patient is a 66 year old who was last seen by our service in April 2012. At that time she had a history of a partial simple vulvectomy in February of 2012 her vulvar Paget's disease. Some of the surgical margins were positive. Her postoperative course was uncomplicated. She was last seen by our service as stated above April 2012. At that time there was no evidence of any lesions consistent with Paget's. However, there is considerable amount of excoriations throughout the entire vulvar and medial thighs consistent with chronic vulvitis. Since we last saw her she had quite an eventful year in that she had a incarcerated hernia repaired in December of last year due to bowel obstruction by Dr. Johna Sheriff with permanent mesh. In May of this year she also had a TVT bladder by Dr. Osborn Coho. She does continue to wear a pad and also takes Detrol twice daily but states that her urinary leakage is markedly improved. She describes the vulvar irritation is intermittent itching with burning and that it has increased over time as has the pruritus. She does use a steroid cream twice daily and does scratch her vulva. She states that for the past week she's noticed some bleeding and spotting from the vulva. She notices it when she is wiping. There is no significant increase pain related to this.   Review of Systems: She has a change about bladder habits patient denies any vaginal bleeding. As any chest pain shortness of breath nausea vomiting fevers or chills. She has had a 16 pound weight loss since April 2012. She is otherwise doing quite well.  Current Meds:  Outpatient Encounter Prescriptions as of 04/14/2012  Medication Sig Dispense Refill  . albuterol (PROVENTIL, VENTOLIN) (5 MG/ML) 0.5% NEBU Use as directed 5 mg/hr in the mouth or throat continuous.      Marland Kitchen buPROPion  (WELLBUTRIN XL) 150 MG 24 hr tablet Take 150 mg by mouth 2 (two) times daily.        . Calcium Carbonate-Vitamin D 600-125 MG-UNIT TABS Take 2 tablets by mouth daily.      . citalopram (CELEXA) 40 MG tablet Take 60 mg by mouth daily.       Marland Kitchen docusate sodium (COLACE) 100 MG capsule Take 300 mg by mouth at bedtime.      Marland Kitchen GREEN COFFEE BEAN PO Take 1 tablet by mouth daily.       Marland Kitchen levothyroxine (SYNTHROID, LEVOTHROID) 175 MCG tablet Take 175 mcg by mouth daily.      . Multiple Vitamins-Minerals (WOMENS 50+ MULTI VITAMIN/MIN PO) Take 1 tablet by mouth daily.      Marland Kitchen nystatin-triamcinolone (MYCOLOG II) cream Apply 1 application topically 3 (three) times daily as needed.      . Omega-3 Fatty Acids (FISH OIL) 1200 MG CAPS Take 2 capsules by mouth daily.       Marland Kitchen OVER THE COUNTER MEDICATION Take 125 mg by mouth 3 (three) times daily.      Marland Kitchen oxybutynin (DITROPAN) 5 MG tablet Take 1 tablet (5 mg total) by mouth 2 (two) times daily.  60 tablet  6  . pantoprazole (PROTONIX) 40 MG tablet Take 40 mg by mouth 2 (two) times daily.       . Polyethylene Glycol 3350 (MIRALAX PO) Take 34 g by mouth at bedtime.       Marland Kitchen  clotrimazole-betamethasone (LOTRISONE) cream Apply topically daily. For 2wks .  30 g  0  . levofloxacin (LEVAQUIN) 750 MG tablet Take 750 mg by mouth daily.      . nitrofurantoin, macrocrystal-monohydrate, (MACROBID) 100 MG capsule Take 1 capsule (100 mg total) by mouth 2 (two) times daily.  10 capsule  0  . predniSONE (DELTASONE) 20 MG tablet Take 20 mg by mouth daily.      . promethazine (PHENERGAN) 25 MG tablet Take 1 tablet (25 mg total) by mouth every 6 (six) hours as needed for nausea.  20 tablet  0  . solifenacin (VESICARE) 5 MG tablet Take 2 tablets (10 mg total) by mouth daily. May start with 1 tab qd and increase to 2tabs daily if sxs persist.  30 tablet  1  . traMADol (ULTRAM) 50 MG tablet Take 1 tablet (50 mg total) by mouth every 6 (six) hours as needed for pain.  15 tablet  0    Allergy:   Allergies  Allergen Reactions  . Demerol (Meperidine)   . Diazepam Other (See Comments)    Knocks me out   . Tape     "thick clear plastic tape" causes blisters  . Morphine And Related Itching and Rash  . Penicillins Rash    Social Hx:   History   Social History  . Marital Status: Married    Spouse Name: N/A    Number of Children: N/A  . Years of Education: N/A   Occupational History  . Not on file.   Social History Main Topics  . Smoking status: Former Smoker    Types: Cigarettes    Quit date: 06/05/1999  . Smokeless tobacco: Never Used  . Alcohol Use: No  . Drug Use: No  . Sexually Active: No   Other Topics Concern  . Not on file   Social History Narrative  . No narrative on file    Past Surgical Hx:  Past Surgical History  Procedure Date  . Breast surgery   . Breast lumpectomy   . Mastectomy 1997    right breast  . Cholecystectomy   . Knee surgery     x5  . Joint replacement     right knee x2  . Joint replacement     left knee x5  . Back surgery   . Foot surgery     fused left foot  . Tonsillectomy 68  . Thyroid surgery   . Hand tendon surgery     right hand tendon tear  . Rotator cuff repair     right rotator cuff  . Thyroidectomy   . Incisional hernia repair 05/05/2011    Procedure: LAPAROSCOPIC INCISIONAL HERNIA;  Surgeon: Mariella Saa, MD;  Location: [facility];  Service: General;  Laterality: N/A;  LAPAROSCOPIC REPAIR INCARCERATED INCISIONAL HERNIA  WITH MESH  . Hernia repair 05/05/11    emergency VH and incisional hernia repair   . Bladder suspension 10/13/2011    Procedure: TRANSVAGINAL TAPE (TVT) PROCEDURE;  Surgeon: Purcell Nails, MD;  Location: [facility];  Service: Gynecology;  Laterality: N/A;  . Cystoscopy 10/13/2011    Procedure: CYSTOSCOPY;  Surgeon: Purcell Nails, MD;  Location: [facility];  Service: Gynecology;  Laterality: Bilateral;    Past Medical Hx:  Past Medical History  Diagnosis Date  . Cancer   . Asthma   .  Blood transfusion   . GERD (gastroesophageal reflux disease)   . Thyroid disease   . Allergy   .  Hernia   . PONV (postoperative nausea and vomiting)   . Breast cancer, stage 2 05/28/2011  . 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 Hypothyroid 05/28/2011  . GERD (gastroesophageal reflux disease) 05/28/2011  . Fibromyalgia 05/28/2011  . 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 Mild depression 05/28/2011  . Thrombocytopenia, unspecified 05/28/2011  . Hx of migraines   . Bowel obstruction     Family Hx:  Family History  Problem Relation Age of Onset  . Cancer Father 82    lung and kidney   . Alzheimer's disease Mother   . Alcohol abuse Brother   . Heart attack Maternal Grandmother     Vitals:  Blood pressure 118/68, pulse 68, temperature 97.5 F (36.4 C), resp. rate 18, height 5\' 5"  (1.651 m), weight 252 lb (114.306 kg).  Physical Exam: Well-developed female in no acute distress.   Pelvic: External genitalia is notable for surgical resection of the right labia minora. There is a hyperkeratotic raised lesion that somewhat pink and pale encompassing the left labia majora measuring approximately 3 x 3 cm and a separate area just lateral to labia minora measuring approximately 1 x 2 cm. After obtaining the patient's verbal consent the area was cleaned with Betadine clinic solution. Half a cc of 1% lidocaine with tuberculin syringe needle was injected. Biopsy was performed the Tischler forcep. Silver nitrate was applied. The patient tolerated the procedure well.  Assessment/Plan:  66 year old with history of vulvar Paget's he has increasing symptoms and bleeding. We will followup in results of her biopsy from today contact her with the results. Hairo Garraway A., MD 04/14/2012, 2:08 PM

## 2012-04-14 NOTE — Patient Instructions (Signed)
We will call you with the results of the biopsy

## 2012-04-21 ENCOUNTER — Telehealth: Payer: Self-pay | Admitting: *Deleted

## 2012-04-21 NOTE — Telephone Encounter (Signed)
Patient notified of Path results.   

## 2012-05-17 ENCOUNTER — Encounter (HOSPITAL_COMMUNITY): Payer: Self-pay

## 2012-05-17 NOTE — Progress Notes (Signed)
[name]-   Has  APPT PST 05/20/12  And doesn't have orders  THANKS

## 2012-05-20 ENCOUNTER — Encounter (HOSPITAL_COMMUNITY)
Admission: RE | Admit: 2012-05-20 | Discharge: 2012-05-20 | Disposition: A | Discharge status: Home / Self Care | Payer: Medicare Other | Source: Ambulatory Visit | Attending: Gynecologic Oncology | Admitting: Gynecologic Oncology

## 2012-05-20 ENCOUNTER — Encounter (HOSPITAL_COMMUNITY): Payer: Self-pay

## 2012-05-20 LAB — BASIC METABOLIC PANEL
Calcium: 9.2 mg/dL (ref 8.4–10.5)
Creatinine, Ser: 0.6 mg/dL (ref 0.50–1.10)
GFR calc Af Amer: >90 mL/min (ref >90)

## 2012-05-20 LAB — CBC
MCH: 29.3 pg (ref 26.0–34.0)
MCV: 86.9 fL (ref 78.0–100.0)
Platelets: 137 10*3/uL — ABNORMAL LOW (ref 150–400)
RDW: 13.6 % (ref 11.5–15.5)
WBC: 4.2 10*3/uL (ref 4.0–10.5)

## 2012-05-20 NOTE — Patient Instructions (Addendum)
20 [name]  05/20/2012   Your procedure is scheduled on:  1-2 -2014  Report to [facility] at    0930    AM.  Call this number if you have problems the morning of surgery: [phone]  Or Presurgical Testing [phone](Jody Aguinaga)   Remember: Follow any bowel prep instructions per MD office. For Cpap use: Bring mask and tubing only.   Do not eat food:After Midnight.    Take these medicines the morning of surgery with A SIP OF WATER: Bupropion. Citalopram. Levothyroxine. Pantoprazole. Advair and bring. And Albuterol inhaler   Do not wear jewelry, make-up or nail polish.  Do not wear lotions, powders, or perfumes. You may wear deodorant.  Do not shave 48 hours prior to surgery.(face and neck okay, no shaving of legs)  Do not bring valuables to the hospital.  Contacts, dentures or bridgework,body piercing,  may not be worn into surgery.  Leave suitcase in the car. After surgery it may be brought to your room.  For patients admitted to the hospital, checkout time is 11:00 AM the day of discharge.   Patients discharged the day of surgery will not be allowed to drive home. Must have responsible person with you x 24 hours once discharged.  Name and phone number of your driver: zasha, belleau [phone] cell  Special Instructions: CHG Shower Use Special Wash: see special instructions.(avoid face and genitals)   Please read over the following fact sheets that you were given: MRSA Information, Blood Transfusion fact sheet, Incentive Spirometry Instruction.    Failure to follow these instructions may result in Cancellation of your surgery.   Patient signature_______________________________________________________

## 2012-05-20 NOTE — Pre-Procedure Instructions (Addendum)
05-20-12-CXR 03-13-12-Epic 05-20-12 1445 Pt. Notified of Positive PCR screen for Staph aureus-will Use Rx. Mupirocin as directed.W. Kennon Portela

## 2012-05-27 ENCOUNTER — Ambulatory Visit (HOSPITAL_COMMUNITY): Payer: Medicare Other | Admitting: *Deleted

## 2012-05-27 ENCOUNTER — Encounter (HOSPITAL_COMMUNITY): Payer: Self-pay | Admitting: *Deleted

## 2012-05-27 ENCOUNTER — Encounter (HOSPITAL_COMMUNITY)
Admission: RE | Disposition: A | Discharge status: Home / Self Care | Payer: Self-pay | Source: Ambulatory Visit | Attending: Gynecologic Oncology

## 2012-05-27 ENCOUNTER — Ambulatory Visit (HOSPITAL_COMMUNITY)
Admission: RE | Admit: 2012-05-27 | Discharge: 2012-05-27 | Disposition: A | Discharge status: Home / Self Care | Payer: Medicare Other | Source: Ambulatory Visit | Attending: Gynecologic Oncology | Admitting: Gynecologic Oncology

## 2012-05-27 ENCOUNTER — Encounter (HOSPITAL_COMMUNITY): Payer: Self-pay | Admitting: Gynecologic Oncology

## 2012-05-27 DIAGNOSIS — Z01812 Encounter for preprocedural laboratory examination: Secondary | ICD-10-CM | POA: Insufficient documentation

## 2012-05-27 DIAGNOSIS — C519 Malignant neoplasm of vulva, unspecified: Secondary | ICD-10-CM | POA: Insufficient documentation

## 2012-05-27 DIAGNOSIS — C4499 Other specified malignant neoplasm of skin, unspecified: Secondary | ICD-10-CM

## 2012-05-27 DIAGNOSIS — Z01810 Encounter for preprocedural cardiovascular examination: Secondary | ICD-10-CM | POA: Insufficient documentation

## 2012-05-27 HISTORY — PX: VULVECTOMY: SHX1086

## 2012-05-27 SURGERY — WIDE EXCISION VULVECTOMY
Anesthesia: General | Site: Vulva | Wound class: Clean

## 2012-05-27 MED ORDER — FENTANYL CITRATE 0.05 MG/ML IJ SOLN
INTRAMUSCULAR | Status: DC | PRN
  Administered 2012-05-27: 50 ug via INTRAVENOUS
  Administered 2012-05-27: 25 ug via INTRAVENOUS
  Administered 2012-05-27: 50 ug via INTRAVENOUS
  Administered 2012-05-27: 25 ug via INTRAVENOUS

## 2012-05-27 MED ORDER — METOCLOPRAMIDE HCL 5 MG/ML IJ SOLN
INTRAMUSCULAR | Status: DC | PRN
  Administered 2012-05-27: 5 mg via INTRAVENOUS

## 2012-05-27 MED ORDER — ACETIC ACID 5 % SOLN
Status: AC
  Filled 2012-05-27: qty 500

## 2012-05-27 MED ORDER — DEXAMETHASONE SODIUM PHOSPHATE 10 MG/ML IJ SOLN
INTRAMUSCULAR | Status: DC | PRN
  Administered 2012-05-27: 10 mg via INTRAVENOUS

## 2012-05-27 MED ORDER — PROMETHAZINE HCL 25 MG/ML IJ SOLN: 6.2500 mg | INTRAMUSCULAR | Status: DC | PRN

## 2012-05-27 MED ORDER — BUPIVACAINE HCL 0.25 % IJ SOLN
INTRAMUSCULAR | Status: AC
  Filled 2012-05-27: qty 1

## 2012-05-27 MED ORDER — LIDOCAINE HCL (CARDIAC) 20 MG/ML IV SOLN
INTRAVENOUS | Status: DC | PRN
  Administered 2012-05-27: 50 mg via INTRAVENOUS

## 2012-05-27 MED ORDER — CISATRACURIUM BESYLATE (PF) 10 MG/5ML IV SOLN
INTRAVENOUS | Status: DC | PRN
  Administered 2012-05-27: 6 mg via INTRAVENOUS

## 2012-05-27 MED ORDER — LACTATED RINGERS IV SOLN: INTRAVENOUS | Status: DC

## 2012-05-27 MED ORDER — LIDOCAINE-EPINEPHRINE 1 %-1:100000 IJ SOLN
INTRAMUSCULAR | Status: AC
  Filled 2012-05-27: qty 1

## 2012-05-27 MED ORDER — PROPOFOL 10 MG/ML IV EMUL
INTRAVENOUS | Status: DC | PRN
  Administered 2012-05-27: 175 mg via INTRAVENOUS

## 2012-05-27 MED ORDER — ALBUTEROL SULFATE HFA 108 (90 BASE) MCG/ACT IN AERS
INHALATION_SPRAY | RESPIRATORY_TRACT | Status: AC
  Filled 2012-05-27: qty 6.7

## 2012-05-27 MED ORDER — ACETAMINOPHEN 10 MG/ML IV SOLN
INTRAVENOUS | Status: AC
  Filled 2012-05-27: qty 100

## 2012-05-27 MED ORDER — NEOSTIGMINE METHYLSULFATE 1 MG/ML IJ SOLN
INTRAMUSCULAR | Status: DC | PRN
  Administered 2012-05-27: 4 mg via INTRAVENOUS

## 2012-05-27 MED ORDER — OXYCODONE-ACETAMINOPHEN 5-325 MG PO TABS
1.0000 | ORAL_TABLET | Freq: Four times a day (QID) | ORAL | Status: DC | PRN
  Administered 2012-05-27: 1 via ORAL
  Filled 2012-05-27: qty 1

## 2012-05-27 MED ORDER — ONDANSETRON HCL 4 MG/2ML IJ SOLN
INTRAMUSCULAR | Status: DC | PRN
  Administered 2012-05-27 (×2): 2 mg via INTRAVENOUS

## 2012-05-27 MED ORDER — SUCCINYLCHOLINE CHLORIDE 20 MG/ML IJ SOLN
INTRAMUSCULAR | Status: DC | PRN
  Administered 2012-05-27: 100 mg via INTRAVENOUS

## 2012-05-27 MED ORDER — FENTANYL CITRATE 0.05 MG/ML IJ SOLN: 25.0000 ug | INTRAMUSCULAR | Status: DC | PRN

## 2012-05-27 MED ORDER — LACTATED RINGERS IV SOLN
INTRAVENOUS | Status: DC
  Administered 2012-05-27: 13:00:00 via INTRAVENOUS
  Administered 2012-05-27: 1000 mL via INTRAVENOUS

## 2012-05-27 MED ORDER — MIDAZOLAM HCL 5 MG/5ML IJ SOLN
INTRAMUSCULAR | Status: DC | PRN
  Administered 2012-05-27 (×2): 1 mg via INTRAVENOUS

## 2012-05-27 MED ORDER — ACETAMINOPHEN 10 MG/ML IV SOLN
INTRAVENOUS | Status: DC | PRN
  Administered 2012-05-27: 1000 mg via INTRAVENOUS

## 2012-05-27 MED ORDER — BUPIVACAINE HCL (PF) 0.25 % IJ SOLN
INTRAMUSCULAR | Status: DC | PRN
  Administered 2012-05-27: 30 mL

## 2012-05-27 MED ORDER — 0.9 % SODIUM CHLORIDE (POUR BTL) OPTIME
TOPICAL | Status: DC | PRN
  Administered 2012-05-27: 1000 mL

## 2012-05-27 MED ORDER — GLYCOPYRROLATE 0.2 MG/ML IJ SOLN
INTRAMUSCULAR | Status: DC | PRN
  Administered 2012-05-27: .6 mg via INTRAVENOUS

## 2012-05-27 SURGICAL SUPPLY — 35 items
ATTRACTOMAT 16X20 MAGNETIC DRP (DRAPES) ×1 IMPLANT
BLADE SURG 15 STRL LF DISP TIS (BLADE) ×2 IMPLANT
BLADE SURG 15 STRL SS (BLADE) ×4
CANISTER SUCTION 2500CC (MISCELLANEOUS) ×2 IMPLANT
CATH ROBINSON RED A/P 10FR (CATHETERS) ×2 IMPLANT
CLIP TI MEDIUM LARGE 6 (CLIP) IMPLANT
CLOTH BEACON ORANGE TIMEOUT ST (SAFETY) ×2 IMPLANT
COVER MAYO STAND STRL (DRAPES) ×1 IMPLANT
DRAIN CHANNEL RND F F (WOUND CARE) IMPLANT
DRAPE LG THREE QUARTER DISP (DRAPES) ×4 IMPLANT
DRAPE SURG IRRIG POUCH 19X23 (DRAPES) ×2 IMPLANT
EVACUATOR SILICONE 100CC (DRAIN) ×1 IMPLANT
GAUZE SPONGE 4X4 16PLY XRAY LF (GAUZE/BANDAGES/DRESSINGS) ×4 IMPLANT
GLOVE BIO SURGEON STRL SZ 6.5 (GLOVE) ×2 IMPLANT
GLOVE BIO SURGEON STRL SZ7.5 (GLOVE) ×4 IMPLANT
GLOVE BIOGEL PI IND STRL 7.0 (GLOVE) ×1 IMPLANT
GLOVE BIOGEL PI INDICATOR 7.0 (GLOVE) ×1
NS IRRIG 1000ML POUR BTL (IV SOLUTION) ×2 IMPLANT
PACK COOL COMFORT PERI COLD (SET/KITS/TRAYS/PACK) ×7 IMPLANT
PACK MINOR VAGINAL W LONG (CUSTOM PROCEDURE TRAY) ×2 IMPLANT
PENCIL BUTTON HOLSTER BLD 10FT (ELECTRODE) ×2 IMPLANT
SHEET LAVH (DRAPES) ×1 IMPLANT
SPONGE GAUZE 4X4 12PLY (GAUZE/BANDAGES/DRESSINGS) ×2 IMPLANT
SPONGE LAP 18X18 X RAY DECT (DISPOSABLE) ×4 IMPLANT
SUT VIC AB 2-0 CT2 27 (SUTURE) ×6 IMPLANT
SUT VIC AB 2-0 SH 27 (SUTURE)
SUT VIC AB 2-0 SH 27X BRD (SUTURE) IMPLANT
SUT VIC AB 3-0 PS2 18 (SUTURE) ×6
SUT VIC AB 3-0 PS2 18XBRD (SUTURE) ×3 IMPLANT
SUT VICRYL 2 0 18  UND BR (SUTURE) ×1
SUT VICRYL 2 0 18 UND BR (SUTURE) ×1 IMPLANT
SUT VICRYL RAPIDE 3 0 (SUTURE) ×2 IMPLANT
TOWEL OR 17X26 10 PK STRL BLUE (TOWEL DISPOSABLE) ×2 IMPLANT
WATER STERILE IRR 1500ML POUR (IV SOLUTION) ×2 IMPLANT
YANKAUER SUCT BULB TIP 10FT TU (MISCELLANEOUS) ×2 IMPLANT

## 2012-05-27 NOTE — Anesthesia Preprocedure Evaluation (Addendum)
Anesthesia Evaluation  Patient identified by MRN, date of birth, ID band Patient awake    Reviewed: Allergy & Precautions, H&P , NPO status , Patient's Chart, lab work & pertinent test results  History of Anesthesia Complications (+) PONV  Airway Mallampati: II TM Distance: >3 FB Neck ROM: full    Dental  (+) Missing, Loose and Dental Advisory Given,    Pulmonary asthma ,  breath sounds clear to auscultation  Pulmonary exam normal       Cardiovascular Exercise Tolerance: Good negative cardio ROS  Rhythm:regular Rate:Normal     Neuro/Psych PSYCHIATRIC DISORDERS Depression negative neurological ROS  negative psych ROS   GI/Hepatic negative GI ROS, Neg liver ROS, GERD-  Medicated and Controlled,  Endo/Other  negative endocrine ROSMorbid obesity  Renal/GU negative Renal ROS  negative genitourinary   Musculoskeletal  (+) Fibromyalgia -  Abdominal   Peds  Hematology negative hematology ROS (+)   Anesthesia Other Findings   Reproductive/Obstetrics negative OB ROS                           Anesthesia Physical  Anesthesia Plan  ASA: III  Anesthesia Plan: General   Post-op Pain Management:    Induction: Intravenous, Rapid sequence and Cricoid pressure planned  Airway Management Planned: Oral ETT  Additional Equipment:   Intra-op Plan:   Post-operative Plan: Extubation in OR  Informed Consent: I have reviewed the patients History and Physical, chart, labs and discussed the procedure including the risks, benefits and alternatives for the proposed anesthesia with the patient or authorized representative who has indicated his/her understanding and acceptance.   Dental Advisory Given  Plan Discussed with: CRNA and Surgeon  Anesthesia Plan Comments:         Anesthesia Quick Evaluation

## 2012-05-27 NOTE — Transfer of Care (Signed)
Immediate Anesthesia Transfer of Care Note  Patient: [name]  Procedure(s) Performed: Procedure(s) (LRB) with comments: WIDE EXCISION VULVECTOMY (N/A) - Wide Local Excision of Vulva   Patient Location: PACU  Anesthesia Type:General  Level of Consciousness: awake, alert , oriented, patient cooperative, lethargic and responds to stimulation  Airway & Oxygen Therapy: Patient Spontanous Breathing and Patient connected to face mask oxygen  Post-op Assessment: Report given to PACU RN, Post -op Vital signs reviewed and stable and Patient moving all extremities  Post vital signs: Reviewed and stable  Complications: No apparent anesthesia complications

## 2012-05-27 NOTE — Interval H&P Note (Signed)
History and Physical Interval Note:  05/27/2012 12:04 PM  [name]  has presented today for surgery, with the diagnosis of paget's disease  The various methods of treatment have been discussed with the patient and family. After consideration of risks, benefits and other options for treatment, the patient has consented to  Procedure(s) (LRB) with comments: WIDE EXCISION VULVECTOMY (N/A) - Wide Local Excision of Vulva  as a surgical intervention .  The patient's history has been reviewed, patient examined, no change in status, stable for surgery.  I have reviewed the patient's chart and labs.  Questions were answered to the patient's satisfaction.     Antania Hoefling A.

## 2012-05-27 NOTE — Anesthesia Postprocedure Evaluation (Signed)
  Anesthesia Post-op Note  Patient: [name]  Procedure(s) Performed: Procedure(s) (LRB): WIDE EXCISION VULVECTOMY (N/A)  Patient Location: PACU  Anesthesia Type: General  Level of Consciousness: awake and alert   Airway and Oxygen Therapy: Patient Spontanous Breathing  Post-op Pain: mild  Post-op Assessment: Post-op Vital signs reviewed, Patient's Cardiovascular Status Stable, Respiratory Function Stable, Patent Airway and No signs of Nausea or vomiting  Last Vitals:  Filed Vitals:   05/27/12 1400  BP: 99/57  Pulse: 79  Temp:   Resp: 13    Post-op Vital Signs: stable   Complications: No apparent anesthesia complications

## 2012-05-27 NOTE — H&P (Signed)
Consult Note: Gyn-Onc  [name] 67 y.o. female  CC: No chief complaint on file. Paget's Disease   HPI:  Patient is a 67 year old who was last seen by our service in April 2012. At that time she had a history of a partial simple vulvectomy in February of 2012 her vulvar Paget's disease. Some of the surgical margins were positive. Her postoperative course was uncomplicated. She was last seen by our service as stated above April 2012. At that time there was no evidence of any lesions consistent with Paget's. However, there is considerable amount of excoriations throughout the entire vulvar and medial thighs consistent with chronic vulvitis. Since we last saw her she had quite an eventful year in that she had a incarcerated hernia repaired in December of last year due to bowel obstruction by Dr. Johna Sheriff with permanent mesh. In May of this year she also had a TVT bladder by Dr. Osborn Coho. She does continue to wear a pad and also takes Detrol twice daily but states that her urinary leakage is markedly improved. She describes the vulvar irritation is intermittent itching with burning and that it has increased over time as has the pruritus. She does use a steroid cream twice daily and does scratch her vulva. She states that for the past week she's noticed some bleeding and spotting from the vulva. She notices it when she is wiping. There is no significant increase pain related to this.   Review of Systems:  She has a change about bladder habits patient denies any vaginal bleeding. As any chest pain shortness of breath nausea vomiting fevers or chills. She has had a 16 pound weight loss since April 2012. She is otherwise doing quite well.    Current Meds: @[facility] @  Allergy:  Allergies  Allergen Reactions  . Demerol (Meperidine)   . Diazepam Other (See Comments)    Knocks me out   . Tape     "thick clear plastic tape" causes blisters  . Morphine And Related Itching and Rash  . Penicillins  Rash    Social Hx:   History   Social History  . Marital Status: Married    Spouse Name: N/A    Number of Children: N/A  . Years of Education: N/A   Occupational History  . Not on file.   Social History Main Topics  . Smoking status: Former Smoker    Types: Cigarettes    Quit date: 06/05/1999  . Smokeless tobacco: Never Used  . Alcohol Use: No  . Drug Use: No  . Sexually Active: No   Other Topics Concern  . Not on file   Social History Narrative  . No narrative on file    Past Surgical Hx:  Past Surgical History  Procedure Date  . Breast surgery   . Breast lumpectomy   . Mastectomy 1997    right breast  . Cholecystectomy   . Knee surgery     x5  . Joint replacement     right knee x2  . Joint replacement     left knee x5  . Back surgery   . Foot surgery     fused left foot  . Tonsillectomy 68  . Thyroid surgery   . Hand tendon surgery     right hand tendon tear  . Rotator cuff repair     right rotator cuff  . Thyroidectomy   . Incisional hernia repair 05/05/2011    Procedure: LAPAROSCOPIC INCISIONAL HERNIA;  Surgeon: Sharlet Salina  Lurlean Leyden, MD;  Location: [facility];  Service: General;  Laterality: N/A;  LAPAROSCOPIC REPAIR INCARCERATED INCISIONAL HERNIA  WITH MESH  . Hernia repair 05/05/11    emergency VH and incisional hernia repair   . Bladder suspension 10/13/2011    Procedure: TRANSVAGINAL TAPE (TVT) PROCEDURE;  Surgeon: Purcell Nails, MD;  Location: [facility];  Service: Gynecology;  Laterality: N/A;  . Cystoscopy 10/13/2011    Procedure: CYSTOSCOPY;  Surgeon: Purcell Nails, MD;  Location: [facility];  Service: Gynecology;  Laterality: Bilateral;    Past Medical Hx:  Past Medical History  Diagnosis Date  . Cancer   . Asthma   . Blood transfusion   . GERD (gastroesophageal reflux disease)   . Thyroid disease   . Allergy   . Hernia   . PONV (postoperative nausea and vomiting)   . Breast cancer, stage 2 05/28/2011  . DCIS (ductal carcinoma in situ) of  breast 05/28/2011  . Hypothyroid 05/28/2011  . GERD (gastroesophageal reflux disease) 05/28/2011  . Fibromyalgia 05/28/2011  . 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 Mild depression 05/28/2011  . Thrombocytopenia, unspecified 05/28/2011  . Hx of migraines   . Bowel obstruction     Family Hx:  Family History  Problem Relation Age of Onset  . Cancer Father 34    lung and kidney   . Alzheimer's disease Mother   . Alcohol abuse Brother   . Heart attack Maternal Grandmother     Vitals:  Blood pressure 135/70, pulse 84, temperature 97.9 F (36.6 C), temperature source Oral, resp. rate 16, SpO2 98.00%.  Physical Exam:  Physical Exam:  Well-developed female in no acute distress.  Pelvic: External genitalia is notable for surgical resection of the right labia minora. There is a hyperkeratotic raised lesion that somewhat pink and pale encompassing the left labia majora measuring approximately 3 x 3 cm and a separate area just lateral to labia minora measuring approximately 1 x 2 cm. After obtaining the patient's verbal consent the area was cleaned with Betadine clinic solution. Half a cc of 1% lidocaine with tuberculin syringe needle was injected. Biopsy was performed the Tischler forcep. Silver nitrate was applied. The patient tolerated the procedure well.  Assessment/Plan:  67 year old with history of vulvar Paget's he has increasing symptoms and bleeding. We will followup in results of her biopsy from today contact her with the results. Bx + for paget's. Comes in today for resection.    Vanessa Joines A., MD 05/27/2012, 12:02 PM

## 2012-05-27 NOTE — Preoperative (Signed)
Beta Blockers   Reason not to administer Beta Blockers:Not Applicable 

## 2012-05-27 NOTE — Op Note (Signed)
PATIENT: [name] DATE OF BIRTH: 1945-11-03 ENCOUNTER DATE:    Preop Diagnosis: Vulvar Paget's diseae  Postoperative Diagnosis: same.   Surgery: Wide local excision of the vulva  Surgeons:  Shadoe Cryan A. Duard Brady, MD  Assistant: [name]   Anesthesia: General   Estimated blood loss: 25 ml   IVF: 1000 ml   Urine output:  200 ml   Complications: None   Pathology: Left vulva  Operative findings: 3x3 cm and 1x2 cm lesions consistent with vulvar pagets on patients left vulva.   Procedure: The patient was identified in the preoperative holding area. Informed consent was signed on the chart. Patient was seen history was reviewed and exam was performed.   Patient was taken to the operating room placed supine position. General anesthesia was induced.  SCDs were applied and she was placed in the dorsolithotomy position. The perineum was cleansed with Betadine. Straight catheter of the bladder was performed to 200 mL is of clear urine. First timeout was performed.  He ML of quarter percent Marcaine plain was injected in the area of the lesions. The area was marked out with a marking pen. It was then drawn an elliptical fashion to be approximately a 5 x 3 cm incision. The quarter percent Marcaine was used for postoperative anesthesia as well as to create the plain of dissection. An elliptical incision was made with a knife. The staining vulvectomy was performed the ninth. It was" and sent to pathology. Hemostasis was obtained using pinpoint cautery. The edges of the incision was closed without tension with 3-0 Vicryl interrupted fashion.  The patient tolerated the procedure well and was taken recovery room in stable condition.  All instrument needle and Ray-Tec counts were correct x2. The patient tolerated the procedure well and was taken to the recovery room in stable condition. This is Cleda Mccreedy dictating an operative note on patient [name].

## 2012-05-28 ENCOUNTER — Encounter (HOSPITAL_COMMUNITY): Payer: Self-pay | Admitting: Gynecologic Oncology

## 2012-06-03 ENCOUNTER — Encounter: Payer: Self-pay | Admitting: Gynecologic Oncology

## 2012-06-03 ENCOUNTER — Ambulatory Visit: Payer: Medicare Other | Attending: Gynecologic Oncology | Admitting: Gynecologic Oncology

## 2012-06-03 VITALS — BP 110/62 | HR 80 | Temp 98.2°F | Resp 22 | Ht 65.0 in | Wt 256.0 lb

## 2012-06-03 DIAGNOSIS — C519 Malignant neoplasm of vulva, unspecified: Secondary | ICD-10-CM

## 2012-06-03 DIAGNOSIS — C4499 Other specified malignant neoplasm of skin, unspecified: Secondary | ICD-10-CM | POA: Insufficient documentation

## 2012-06-03 NOTE — Patient Instructions (Signed)
RTC 4-6 weeks

## 2012-06-03 NOTE — Progress Notes (Signed)
Consult Note: Gyn-Onc  [name] 67 y.o. female  CC:  Chief Complaint  Patient presents with  . Paget's    Follow up post-op    HPI: Patient is a 67 year old who was last seen by our service in April 2012. At that time she had a history of a partial simple vulvectomy in February of 2012 her vulvar Paget's disease. Some of the surgical margins were positive. Her postoperative course was uncomplicated. She was last seen by our service as stated above April 2012. At that time there was no evidence of any lesions consistent with Paget's. However, there is considerable amount of excoriations throughout the entire vulvar and medial thighs consistent with chronic vulvitis. Since we last saw her she had quite an eventful year in that she had a incarcerated hernia repaired in December of last year due to bowel obstruction by Dr. Johna Sheriff with permanent mesh. In May of this year she also had a TVT bladder by Dr. Osborn Coho. She does continue to wear a pad and also takes Detrol twice daily but states that her urinary leakage is markedly improved. She describes the vulvar irritation is intermittent itching with burning and that it has increased over time as has the pruritus. She does use a steroid cream twice daily and does scratch her vulva.   Interval History:  She underwent a wide local excision of the left vulva on January 28 of year 2014. Operative findings included 2 separate 3 x 3 and 1.2 cm lesions consistent with Paget's on the left.  Pathology revealed 2 foci of extramammary Paget's disease extending to the left inked margin in the right and anterior tip margins at multiple foci. The posterior margin was negative for malignancy.  She comes in today for her postoperative check. She's overall doing quite well she's doing her sitz baths twice a day and her pain is well-controlled. She's noticed minimal disc discharge and bleeding. She does have some pain when sitting down on a hard surface.   At home she sits on a pillow with a doughnut-type below to help protect her perineum.   Current Meds:  Outpatient Encounter Prescriptions as of 06/03/2012  Medication Sig Dispense Refill  . albuterol (PROVENTIL, VENTOLIN) (5 MG/ML) 0.5% NEBU Use as directed 5 mg/hr in the mouth or throat continuous.      Marland Kitchen buPROPion (WELLBUTRIN XL) 150 MG 24 hr tablet Take 150 mg by mouth 2 (two) times daily.        . Calcium Carbonate-Vitamin D 600-125 MG-UNIT TABS Take 2 tablets by mouth daily.      . citalopram (CELEXA) 40 MG tablet Take 20-40 mg by mouth 2 (two) times daily. Pt takes 1 tablet in the morning for 40 mg. 1/2 tablet at bedtime for 20 mg dose to equal a total dose of 60 mg      . clotrimazole-betamethasone (LOTRISONE) cream Apply topically daily. For 2wks .  30 g  0  . docusate sodium (COLACE) 100 MG capsule Take 300 mg by mouth at bedtime.      Marland Kitchen GREEN COFFEE BEAN PO Take 1 tablet by mouth daily.       Marland Kitchen levothyroxine (SYNTHROID, LEVOTHROID) 175 MCG tablet Take 175 mcg by mouth every morning.       . Multiple Vitamins-Minerals (WOMENS 50+ MULTI VITAMIN/MIN PO) Take 1 tablet by mouth daily.      Marland Kitchen nystatin-triamcinolone (MYCOLOG II) cream Apply 1 application topically 3 (three) times daily as needed.      Marland Kitchen  Omega-3 Fatty Acids (FISH OIL) 1200 MG CAPS Take 2 capsules by mouth daily.       Marland Kitchen OVER THE COUNTER MEDICATION Take 125 mg by mouth 3 (three) times daily.      Marland Kitchen oxybutynin (DITROPAN) 5 MG tablet Take 1 tablet (5 mg total) by mouth 2 (two) times daily.  60 tablet  6  . pantoprazole (PROTONIX) 40 MG tablet Take 40 mg by mouth 2 (two) times daily.       . Polyethylene Glycol 3350 (MIRALAX PO) Take 34 g by mouth at bedtime.       . traMADol (ULTRAM) 50 MG tablet Take 1 tablet (50 mg total) by mouth every 6 (six) hours as needed for pain.  15 tablet  0    Allergy:  Allergies  Allergen Reactions  . Demerol (Meperidine)   . Diazepam Other (See Comments)    Knocks me out   . Tape     "thick  clear plastic tape" causes blisters  . Morphine And Related Itching and Rash  . Penicillins Rash    Social Hx:   History   Social History  . Marital Status: Married    Spouse Name: N/A    Number of Children: N/A  . Years of Education: N/A   Occupational History  . Not on file.   Social History Main Topics  . Smoking status: Former Smoker    Types: Cigarettes    Quit date: 06/05/1999  . Smokeless tobacco: Never Used  . Alcohol Use: No  . Drug Use: No  . Sexually Active: No   Other Topics Concern  . Not on file   Social History Narrative  . No narrative on file    Past Surgical Hx:  Past Surgical History  Procedure Date  . Breast surgery   . Breast lumpectomy   . Mastectomy 1997    right breast  . Cholecystectomy   . Knee surgery     x5  . Joint replacement     right knee x2  . Joint replacement     left knee x5  . Back surgery   . Foot surgery     fused left foot  . Tonsillectomy 68  . Thyroid surgery   . Hand tendon surgery     right hand tendon tear  . Rotator cuff repair     right rotator cuff  . Thyroidectomy   . Incisional hernia repair 05/05/2011    Procedure: LAPAROSCOPIC INCISIONAL HERNIA;  Surgeon: Mariella Saa, MD;  Location: [facility];  Service: General;  Laterality: N/A;  LAPAROSCOPIC REPAIR INCARCERATED INCISIONAL HERNIA  WITH MESH  . Hernia repair 05/05/11    emergency VH and incisional hernia repair   . Bladder suspension 10/13/2011    Procedure: TRANSVAGINAL TAPE (TVT) PROCEDURE;  Surgeon: Purcell Nails, MD;  Location: [facility];  Service: Gynecology;  Laterality: N/A;  . Cystoscopy 10/13/2011    Procedure: CYSTOSCOPY;  Surgeon: Purcell Nails, MD;  Location: [facility];  Service: Gynecology;  Laterality: Bilateral;  . Vulvectomy 05/27/2012    Procedure: WIDE EXCISION VULVECTOMY;  Surgeon: Rejeana Brock A. Duard Brady, MD;  Location: [facility];  Service: Gynecology;  Laterality: N/A;  Wide Local Excision of Vulva     Past Medical Hx:  Past Medical  History  Diagnosis Date  . Cancer   . Asthma   . Blood transfusion   . GERD (gastroesophageal reflux disease)   . Thyroid disease   . Allergy   .  Hernia   . PONV (postoperative nausea and vomiting)   . Breast cancer, stage 2 05/28/2011  . 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 Hypothyroid 05/28/2011  . GERD (gastroesophageal reflux disease) 05/28/2011  . Fibromyalgia 05/28/2011  . 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 Mild depression 05/28/2011  . Thrombocytopenia, unspecified 05/28/2011  . Hx of migraines   . Bowel obstruction     Family Hx:  Family History  Problem Relation Age of Onset  . Cancer Father 11    lung and kidney   . Alzheimer's disease Mother   . Alcohol abuse Brother   . Heart attack Maternal Grandmother     Vitals:  Blood pressure 110/62, pulse 80, temperature 98.2 F (36.8 C), temperature source Oral, resp. rate 22, height 5\' 5"  (1.651 m), weight 256 lb (116.121 kg).  Physical Exam:  Well-nourished vulva female in no acute distress.  Pelvic: External genitalia notable for a healing suture incision on the left side. There is no induration there is no purulent discharge suture line is intact. It is clean.  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 She'll continue her perineal care as she is doing an excellent job. She return to see [place] in 4-6 weeks. Because of the positive margins me to follow her closely to assure no recurrence.  Shayn Madole A., MD 06/03/2012, 11:41 AM

## 2012-06-12 ENCOUNTER — Telehealth: Payer: Self-pay | Admitting: *Deleted

## 2012-06-12 NOTE — Telephone Encounter (Signed)
Patient notified of Path results.  F/U appt 07/08/12

## 2012-07-08 ENCOUNTER — Ambulatory Visit: Payer: Medicare Other | Admitting: Gynecologic Oncology

## 2012-07-14 ENCOUNTER — Ambulatory Visit: Payer: Medicare Other | Admitting: Gynecologic Oncology

## 2012-07-20 ENCOUNTER — Encounter: Payer: Self-pay | Admitting: Gynecologic Oncology

## 2012-07-20 ENCOUNTER — Ambulatory Visit: Payer: Medicare Other | Attending: Gynecologic Oncology | Admitting: Gynecologic Oncology

## 2012-07-20 VITALS — BP 140/60 | HR 68 | Temp 98.8°F | Resp 22 | Ht 65.0 in | Wt 259.7 lb

## 2012-07-20 DIAGNOSIS — Z96659 Presence of unspecified artificial knee joint: Secondary | ICD-10-CM | POA: Insufficient documentation

## 2012-07-20 DIAGNOSIS — Z79899 Other long term (current) drug therapy: Secondary | ICD-10-CM | POA: Insufficient documentation

## 2012-07-20 DIAGNOSIS — Z09 Encounter for follow-up examination after completed treatment for conditions other than malignant neoplasm: Secondary | ICD-10-CM | POA: Insufficient documentation

## 2012-07-20 DIAGNOSIS — K219 Gastro-esophageal reflux disease without esophagitis: Secondary | ICD-10-CM | POA: Insufficient documentation

## 2012-07-20 DIAGNOSIS — N9089 Other specified noninflammatory disorders of vulva and perineum: Secondary | ICD-10-CM | POA: Insufficient documentation

## 2012-07-20 DIAGNOSIS — E039 Hypothyroidism, unspecified: Secondary | ICD-10-CM | POA: Insufficient documentation

## 2012-07-20 DIAGNOSIS — C519 Malignant neoplasm of vulva, unspecified: Secondary | ICD-10-CM

## 2012-07-20 NOTE — Patient Instructions (Signed)
Return to clinic in 4 months. Call your primary MD or our office if your abdominal pain does not improve.

## 2012-07-20 NOTE — Progress Notes (Signed)
Consult Note: Gyn-Onc  [name] 67 y.o. female  CC:  Chief Complaint  Patient presents with  . Paget's Disease of Vulva    Follow up    HPI: Patient is a 67 year old who was last seen by our service in April 2012. At that time she had a history of a partial simple vulvectomy in February of 2012 her vulvar Paget's disease. Some of the surgical margins were positive. Her postoperative course was uncomplicated. She was last seen by our service as stated above April 2012. At that time there was no evidence of any lesions consistent with Paget's. However, there is considerable amount of excoriations throughout the entire vulvar and medial thighs consistent with chronic vulvitis. Since we last saw her she had quite an eventful year in that she had a incarcerated hernia repaired in December of last year due to bowel obstruction by Dr. Johna Sheriff with permanent mesh. In May of this year she also had a TVT bladder by Dr. Osborn Coho. She does continue to wear a pad and also takes Detrol twice daily but states that her urinary leakage is markedly improved. She describes the vulvar irritation is intermittent itching with burning and that it has increased over time as has the pruritus. She does use a steroid cream twice daily and does scratch her vulva.   Interval History:  She underwent a wide local excision of the left vulva on January 2 of year 2014. Operative findings included 2 separate 3 x 3 and 1.2 cm lesions consistent with Paget's on the left.  Pathology revealed 2 foci of extramammary Paget's disease extending to the left inked margin in the right and anterior tip margins at multiple foci. The posterior margin was negative for malignancy.   She comes in today for her postoperative check. I last saw her 06/03/12 at which time it was healing well.  She's overall doing quite well and her pain is well-controlled. She's noticed minimal discharge and bleeding. She woke up with some abdominal pain  bilaterally. She has had some constipation but is taking her stool softeners and her bowels are more regular.  She took a pain pill with some relief of her pain. She is not very physically active. She had some nausea yesterday, no emesis, no nausea today.   Current Meds:  Outpatient Encounter Prescriptions as of 07/20/2012  Medication Sig Dispense Refill  . albuterol (PROVENTIL, VENTOLIN) (5 MG/ML) 0.5% NEBU Use as directed 5 mg/hr in the mouth or throat continuous.      Marland Kitchen buPROPion (WELLBUTRIN XL) 150 MG 24 hr tablet Take 150 mg by mouth 2 (two) times daily.        . Calcium Carbonate-Vitamin D 600-125 MG-UNIT TABS Take 2 tablets by mouth daily.      . citalopram (CELEXA) 40 MG tablet Take 20-40 mg by mouth 2 (two) times daily. Pt takes 1 tablet in the morning for 40 mg. 1/2 tablet at bedtime for 20 mg dose to equal a total dose of 60 mg      . clotrimazole-betamethasone (LOTRISONE) cream Apply topically daily. For 2wks .  30 g  0  . docusate sodium (COLACE) 100 MG capsule Take 300 mg by mouth at bedtime.      Marland Kitchen GREEN COFFEE BEAN PO Take 1 tablet by mouth daily.       Marland Kitchen levothyroxine (SYNTHROID, LEVOTHROID) 175 MCG tablet Take 175 mcg by mouth every morning.       . Multiple Vitamins-Minerals (WOMENS 50+ MULTI  VITAMIN/MIN PO) Take 1 tablet by mouth daily.      Marland Kitchen nystatin-triamcinolone (MYCOLOG II) cream Apply 1 application topically 3 (three) times daily as needed.      . Omega-3 Fatty Acids (FISH OIL) 1200 MG CAPS Take 2 capsules by mouth daily.       Marland Kitchen OVER THE COUNTER MEDICATION Take 125 mg by mouth 3 (three) times daily.      Marland Kitchen oxybutynin (DITROPAN) 5 MG tablet Take 1 tablet (5 mg total) by mouth 2 (two) times daily.  60 tablet  6  . pantoprazole (PROTONIX) 40 MG tablet Take 40 mg by mouth 2 (two) times daily.       . Polyethylene Glycol 3350 (MIRALAX PO) Take 34 g by mouth at bedtime.       . traMADol (ULTRAM) 50 MG tablet Take 1 tablet (50 mg total) by mouth every 6 (six) hours as needed  for pain.  15 tablet  0   No facility-administered encounter medications on file as of 07/20/2012.    Allergy:  Allergies  Allergen Reactions  . Demerol (Meperidine)   . Diazepam Other (See Comments)    Knocks me out   . Tape     "thick clear plastic tape" causes blisters  . Morphine And Related Itching and Rash  . Penicillins Rash    Social Hx:   History   Social History  . Marital Status: Married    Spouse Name: N/A    Number of Children: N/A  . Years of Education: N/A   Occupational History  . Not on file.   Social History Main Topics  . Smoking status: Former Smoker    Types: Cigarettes    Quit date: 06/05/1999  . Smokeless tobacco: Never Used  . Alcohol Use: No  . Drug Use: No  . Sexually Active: No   Other Topics Concern  . Not on file   Social History Narrative  . No narrative on file    Past Surgical Hx:  Past Surgical History  Procedure Laterality Date  . Breast surgery    . Breast lumpectomy    . Mastectomy  1997    right breast  . Cholecystectomy    . Knee surgery      x5  . Joint replacement      right knee x2  . Joint replacement      left knee x5  . Back surgery    . Foot surgery      fused left foot  . Tonsillectomy  68  . Thyroid surgery    . Hand tendon surgery      right hand tendon tear  . Rotator cuff repair      right rotator cuff  . Thyroidectomy    . Incisional hernia repair  05/05/2011    Procedure: LAPAROSCOPIC INCISIONAL HERNIA;  Surgeon: Mariella Saa, MD;  Location: [facility];  Service: General;  Laterality: N/A;  LAPAROSCOPIC REPAIR INCARCERATED INCISIONAL HERNIA  WITH MESH  . Hernia repair  05/05/11    emergency VH and incisional hernia repair   . Bladder suspension  10/13/2011    Procedure: TRANSVAGINAL TAPE (TVT) PROCEDURE;  Surgeon: Purcell Nails, MD;  Location: [facility];  Service: Gynecology;  Laterality: N/A;  . Cystoscopy  10/13/2011    Procedure: CYSTOSCOPY;  Surgeon: Purcell Nails, MD;  Location: [facility];  Service: Gynecology;  Laterality: Bilateral;  . Vulvectomy  05/27/2012    Procedure: WIDE EXCISION VULVECTOMY;  Surgeon:  Avraham Benish A. Duard Brady, MD;  Location: [facility];  Service: Gynecology;  Laterality: N/A;  Wide Local Excision of Vulva     Past Medical Hx:  Past Medical History  Diagnosis Date  . Cancer   . Asthma   . Blood transfusion   . GERD (gastroesophageal reflux disease)   . Thyroid disease   . Allergy   . Hernia   . PONV (postoperative nausea and vomiting)   . Breast cancer, stage 2 05/28/2011  . 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 Hypothyroid 05/28/2011  . GERD (gastroesophageal reflux disease) 05/28/2011  . Fibromyalgia 05/28/2011  . 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 Mild depression 05/28/2011  . Thrombocytopenia, unspecified 05/28/2011  . Hx of migraines   . Bowel obstruction     Family Hx:  Family History  Problem Relation Age of Onset  . Cancer Father 70    lung and kidney   . Alzheimer's disease Mother   . Alcohol abuse Brother   . Heart attack Maternal Grandmother     Vitals:  Blood pressure 140/60, pulse 68, temperature 98.8 F (37.1 C), temperature source Oral, resp. rate 22, height 5\' 5"  (1.651 m), weight 259 lb 11.2 oz (117.799 kg).  Physical Exam: Well-nourished well-developed female in no acute distress.  Abdomen: Tender within the pannus itself. There's no evidence of a necrotizing fasciitis. There is no rebound or guarding. There is no masses palpable within the pannus. There is no nodularity.  Pelvic: External genitalia status post excision of the left labia. The area is well-healed. There is no new visible lesions. There's 1 cm sebaceous cyst on the right labia majora.  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 She'll continue her perineal care as she is doing an excellent job. She return to see [place] in 4 months. Because of the positive margins me to follow her closely to assure no  recurrence. She will monitor her abdominal pain if it does not improve she'll either address it with her primary physician or give [place] a call   Cadience Bradfield A., MD 07/20/2012, 12:36 PM

## 2012-07-30 ENCOUNTER — Ambulatory Visit: Payer: Medicare Other

## 2012-08-02 ENCOUNTER — Ambulatory Visit
Admission: RE | Admit: 2012-08-02 | Discharge: 2012-08-02 | Disposition: A | Discharge status: Home / Self Care | Payer: Medicare Other | Source: Ambulatory Visit | Attending: Oncology | Admitting: Oncology

## 2012-08-02 ENCOUNTER — Telehealth: Payer: Self-pay | Admitting: Obstetrics and Gynecology

## 2012-08-02 ENCOUNTER — Other Ambulatory Visit: Payer: Self-pay | Admitting: Oncology

## 2012-08-02 DIAGNOSIS — Z1231 Encounter for screening mammogram for malignant neoplasm of breast: Secondary | ICD-10-CM

## 2012-08-02 DIAGNOSIS — Z853 Personal history of malignant neoplasm of breast: Secondary | ICD-10-CM

## 2012-08-02 DIAGNOSIS — Z9011 Acquired absence of right breast and nipple: Secondary | ICD-10-CM

## 2012-08-02 DIAGNOSIS — N6459 Other signs and symptoms in breast: Secondary | ICD-10-CM

## 2012-08-02 DIAGNOSIS — Z9889 Other specified postprocedural states: Secondary | ICD-10-CM

## 2012-08-02 NOTE — Telephone Encounter (Signed)
sr pt 

## 2012-08-02 NOTE — Telephone Encounter (Signed)
Order entered

## 2012-08-03 ENCOUNTER — Telehealth: Payer: Self-pay | Admitting: Obstetrics and Gynecology

## 2012-08-03 NOTE — Telephone Encounter (Signed)
Pt had questions regarding diagnostic mmg. Advised pt that order was put into system for this.   Darien Ramus, CMA

## 2012-08-11 ENCOUNTER — Ambulatory Visit
Admission: RE | Admit: 2012-08-11 | Discharge: 2012-08-11 | Disposition: A | Discharge status: Home / Self Care | Payer: Medicare Other | Source: Ambulatory Visit | Attending: Obstetrics and Gynecology | Admitting: Obstetrics and Gynecology

## 2012-08-11 ENCOUNTER — Other Ambulatory Visit: Payer: Self-pay | Admitting: Obstetrics and Gynecology

## 2012-08-11 DIAGNOSIS — Z853 Personal history of malignant neoplasm of breast: Secondary | ICD-10-CM

## 2012-10-26 ENCOUNTER — Emergency Department (HOSPITAL_COMMUNITY): Payer: Medicare Other

## 2012-10-26 ENCOUNTER — Encounter (HOSPITAL_COMMUNITY): Payer: Self-pay | Admitting: Emergency Medicine

## 2012-10-26 ENCOUNTER — Emergency Department (HOSPITAL_COMMUNITY)
Admission: EM | Admit: 2012-10-26 | Discharge: 2012-10-26 | Disposition: A | Discharge status: Home / Self Care | Payer: Medicare Other | Attending: Emergency Medicine | Admitting: Emergency Medicine

## 2012-10-26 DIAGNOSIS — W010XXA Fall on same level from slipping, tripping and stumbling without subsequent striking against object, initial encounter: Secondary | ICD-10-CM | POA: Insufficient documentation

## 2012-10-26 DIAGNOSIS — Y9289 Other specified places as the place of occurrence of the external cause: Secondary | ICD-10-CM | POA: Insufficient documentation

## 2012-10-26 DIAGNOSIS — F329 Major depressive disorder, single episode, unspecified: Secondary | ICD-10-CM | POA: Insufficient documentation

## 2012-10-26 DIAGNOSIS — E039 Hypothyroidism, unspecified: Secondary | ICD-10-CM | POA: Insufficient documentation

## 2012-10-26 DIAGNOSIS — S79929A Unspecified injury of unspecified thigh, initial encounter: Secondary | ICD-10-CM | POA: Insufficient documentation

## 2012-10-26 DIAGNOSIS — J45909 Unspecified asthma, uncomplicated: Secondary | ICD-10-CM | POA: Insufficient documentation

## 2012-10-26 DIAGNOSIS — S99929A Unspecified injury of unspecified foot, initial encounter: Secondary | ICD-10-CM | POA: Insufficient documentation

## 2012-10-26 DIAGNOSIS — Z8679 Personal history of other diseases of the circulatory system: Secondary | ICD-10-CM | POA: Insufficient documentation

## 2012-10-26 DIAGNOSIS — Z9889 Other specified postprocedural states: Secondary | ICD-10-CM | POA: Insufficient documentation

## 2012-10-26 DIAGNOSIS — Z87891 Personal history of nicotine dependence: Secondary | ICD-10-CM | POA: Insufficient documentation

## 2012-10-26 DIAGNOSIS — Z8739 Personal history of other diseases of the musculoskeletal system and connective tissue: Secondary | ICD-10-CM | POA: Insufficient documentation

## 2012-10-26 DIAGNOSIS — Z79899 Other long term (current) drug therapy: Secondary | ICD-10-CM | POA: Insufficient documentation

## 2012-10-26 DIAGNOSIS — K56609 Unspecified intestinal obstruction, unspecified as to partial versus complete obstruction: Secondary | ICD-10-CM | POA: Insufficient documentation

## 2012-10-26 DIAGNOSIS — Y9301 Activity, walking, marching and hiking: Secondary | ICD-10-CM | POA: Insufficient documentation

## 2012-10-26 DIAGNOSIS — S8990XA Unspecified injury of unspecified lower leg, initial encounter: Secondary | ICD-10-CM | POA: Insufficient documentation

## 2012-10-26 DIAGNOSIS — Z8719 Personal history of other diseases of the digestive system: Secondary | ICD-10-CM | POA: Insufficient documentation

## 2012-10-26 DIAGNOSIS — S2231XA Fracture of one rib, right side, initial encounter for closed fracture: Secondary | ICD-10-CM

## 2012-10-26 DIAGNOSIS — Z880 Allergy status to penicillin: Secondary | ICD-10-CM | POA: Insufficient documentation

## 2012-10-26 DIAGNOSIS — S79919A Unspecified injury of unspecified hip, initial encounter: Secondary | ICD-10-CM | POA: Insufficient documentation

## 2012-10-26 DIAGNOSIS — Z853 Personal history of malignant neoplasm of breast: Secondary | ICD-10-CM | POA: Insufficient documentation

## 2012-10-26 DIAGNOSIS — W102XXA Fall (on)(from) incline, initial encounter: Secondary | ICD-10-CM

## 2012-10-26 DIAGNOSIS — F3289 Other specified depressive episodes: Secondary | ICD-10-CM | POA: Insufficient documentation

## 2012-10-26 DIAGNOSIS — S2249XA Multiple fractures of ribs, unspecified side, initial encounter for closed fracture: Secondary | ICD-10-CM | POA: Insufficient documentation

## 2012-10-26 DIAGNOSIS — Z862 Personal history of diseases of the blood and blood-forming organs and certain disorders involving the immune mechanism: Secondary | ICD-10-CM | POA: Insufficient documentation

## 2012-10-26 DIAGNOSIS — K219 Gastro-esophageal reflux disease without esophagitis: Secondary | ICD-10-CM | POA: Insufficient documentation

## 2012-10-26 DIAGNOSIS — M47817 Spondylosis without myelopathy or radiculopathy, lumbosacral region: Secondary | ICD-10-CM | POA: Insufficient documentation

## 2012-10-26 MED ORDER — HYDROCODONE-ACETAMINOPHEN 5-325 MG PO TABS: 1.0000 | ORAL_TABLET | ORAL | Status: DC | PRN

## 2012-10-26 NOTE — ED Provider Notes (Signed)
History     CSN: [id number]  Arrival date & time 10/26/12  1327   First MD Initiated Contact with Patient 10/26/12 1443      Chief Complaint  Patient presents with  . Fall  . Hip Pain  . Knee Pain    (Consider location/radiation/quality/duration/timing/severity/associated sxs/prior treatment) HPI Comments: Patient reports she was walking to her car and talking to people when her feet got tangled up under her and she fell on her right side.  States she fell on her right knee, then right hip, then right ribs.  Pain is 3/10 in knee and ribs.  Denies cough, SOB, weakness or numbness of the leg. Denies hitting head or LOC, denies dizziness or lightheadedness.    The history is provided by the patient.    Past Medical History  Diagnosis Date  . Cancer   . Asthma   . Blood transfusion   . GERD (gastroesophageal reflux disease)   . Thyroid disease   . Allergy   . Hernia   . PONV (postoperative nausea and vomiting)   . Breast cancer, stage 2 05/28/2011  . 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 Hypothyroid 05/28/2011  . GERD (gastroesophageal reflux disease) 05/28/2011  . Fibromyalgia 05/28/2011  . 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 Mild depression 05/28/2011  . Thrombocytopenia, unspecified 05/28/2011  . Hx of migraines   . Bowel obstruction     Past Surgical History  Procedure Laterality Date  . Breast surgery    . Breast lumpectomy    . Mastectomy  1997    right breast  . Cholecystectomy    . Knee surgery      x5  . Joint replacement      right knee x2  . Joint replacement      left knee x5  . Back surgery    . Foot surgery      fused left foot  . Tonsillectomy  67  . Thyroid surgery    . Hand tendon surgery      right hand tendon tear  . Rotator cuff repair      right rotator cuff  . Thyroidectomy    . Incisional hernia repair  05/05/2011    Procedure: LAPAROSCOPIC INCISIONAL HERNIA;  Surgeon: Mariella Saa, MD;   Location: [facility];  Service: General;  Laterality: N/A;  LAPAROSCOPIC REPAIR INCARCERATED INCISIONAL HERNIA  WITH MESH  . Hernia repair  05/05/11    emergency VH and incisional hernia repair   . Bladder suspension  10/13/2011    Procedure: TRANSVAGINAL TAPE (TVT) PROCEDURE;  Surgeon: Purcell Nails, MD;  Location: [facility];  Service: Gynecology;  Laterality: N/A;  . Cystoscopy  10/13/2011    Procedure: CYSTOSCOPY;  Surgeon: Purcell Nails, MD;  Location: [facility];  Service: Gynecology;  Laterality: Bilateral;  . Vulvectomy  05/27/2012    Procedure: WIDE EXCISION VULVECTOMY;  Surgeon: Rejeana Brock A. Duard Brady, MD;  Location: [facility];  Service: Gynecology;  Laterality: N/A;  Wide Local Excision of Vulva     Family History  Problem Relation Age of Onset  . Cancer Father 83    lung and kidney   . Alzheimer's disease Mother   . Alcohol abuse Brother   . Heart attack Maternal Grandmother     History  Substance Use Topics  . Smoking status: Former Smoker    Types: Cigarettes    Quit date: 06/05/1999  . Smokeless tobacco: Never Used  .  Alcohol Use: No    OB History   Grav Para Term Preterm Abortions TAB SAB Ect Mult Living   1 0              Review of Systems  HENT: Negative for neck pain.   Respiratory: Negative for shortness of breath.   Gastrointestinal: Negative for abdominal pain.  Musculoskeletal: Negative for back pain.  Skin: Negative for color change and wound.  Neurological: Negative for dizziness, weakness, light-headedness, numbness and headaches.    Allergies  Demerol; Diazepam; Tape; Morphine and related; and Penicillins  Home Medications   Current Outpatient Rx  Name  Route  Sig  Dispense  Refill  . albuterol (PROVENTIL, VENTOLIN) (5 MG/ML) 0.5% NEBU   Mouth/Throat   Use as directed 5 mg/hr in the mouth or throat continuous.         Marland Kitchen buPROPion (WELLBUTRIN XL) 150 MG 24 hr tablet   Oral   Take 150 mg by mouth 2 (two) times daily.           . Calcium  Carbonate-Vitamin D 600-125 MG-UNIT TABS   Oral   Take 2 tablets by mouth daily.         . citalopram (CELEXA) 40 MG tablet   Oral   Take 20-40 mg by mouth 2 (two) times daily. Pt takes 1 tablet in the morning for 40 mg. 1/2 tablet at bedtime for 20 mg dose to equal a total dose of 60 mg         . clotrimazole-betamethasone (LOTRISONE) cream   Topical   Apply topically daily. For 2wks .   30 g   0   . docusate sodium (COLACE) 100 MG capsule   Oral   Take 300 mg by mouth at bedtime.         Marland Kitchen GREEN COFFEE BEAN PO   Oral   Take 1 tablet by mouth daily.          Marland Kitchen levothyroxine (SYNTHROID, LEVOTHROID) 175 MCG tablet   Oral   Take 175 mcg by mouth every morning.          . Multiple Vitamins-Minerals (WOMENS 50+ MULTI VITAMIN/MIN PO)   Oral   Take 1 tablet by mouth daily.         Marland Kitchen nystatin-triamcinolone (MYCOLOG II) cream   Topical   Apply 1 application topically 3 (three) times daily as needed.         . Omega-3 Fatty Acids (FISH OIL) 1200 MG CAPS   Oral   Take 2 capsules by mouth daily.          Marland Kitchen OVER THE COUNTER MEDICATION   Oral   Take 125 mg by mouth 3 (three) times daily.         Marland Kitchen oxybutynin (DITROPAN) 5 MG tablet   Oral   Take 1 tablet (5 mg total) by mouth 2 (two) times daily.   60 tablet   6   . pantoprazole (PROTONIX) 40 MG tablet   Oral   Take 40 mg by mouth 2 (two) times daily.          . Polyethylene Glycol 3350 (MIRALAX PO)   Oral   Take 34 g by mouth at bedtime.          . traMADol (ULTRAM) 50 MG tablet   Oral   Take 1 tablet (50 mg total) by mouth every 6 (six) hours as needed for pain.   15 tablet   0  BP 114/80  Pulse 84  Temp(Src) 99.5 F (37.5 C) (Oral)  Resp 16  SpO2 96%  Physical Exam  Nursing note and vitals reviewed. Constitutional: She appears well-developed and well-nourished. No distress.  HENT:  Head: Normocephalic and atraumatic.  Neck: Neck supple.  Pulmonary/Chest: Effort normal and  breath sounds normal. No respiratory distress. She has no wheezes. She has no rales. She exhibits tenderness and bony tenderness. She exhibits no crepitus and no deformity.    Musculoskeletal:  Spine nontender.  Mild lateral tenderness over right knee.  Right hip nontender.  Sensation intact, distal pulses intact.    Neurological: She is alert.  Skin: She is not diaphoretic.    ED Course  Procedures (including critical care time)  Labs Reviewed - No data to display Dg Chest 2 View  10/26/2012   *RADIOLOGY REPORT*  Clinical Data: Fall, right lower anterior chest tenderness.  CHEST - 2 VIEW  Comparison: 04/07/2012.  Findings: Stable cardiomegaly.  No infiltrates or failure.  No effusion or pneumothorax.  Right mastectomy with axillary clips are noted.  There is a suspected rib fracture of the right eighth or ninth rib laterally (arrow).  Consider rib detail for further evaluation.  No worrisome osseous lesions.  No evidence for metastatic disease to the lungs or bones.  Compared with priors, a similar rib deformity was not definitely present.  IMPRESSION: Cannot exclude a right lateral eighth or ninth rib fracture. Consider rib detail for further evaluation.  Cardiomegaly without active infiltrates or pneumothorax.   Original Report Authenticated By: Davonna Belling, M.D.   Dg Hip Complete Right  10/26/2012   *RADIOLOGY REPORT*  Clinical Data: Fall, right hip pain.  RIGHT HIP - COMPLETE 2+ VIEW  Comparison:  None.  Findings:  There is no evidence of hip fracture or dislocation. There is no evidence of arthropathy or other focal bone abnormality. Mild degenerative joint space narrowing is present on the right.  IMPRESSION: Negative for fracture.   Original Report Authenticated By: Davonna Belling, M.D.   Dg Knee Complete 4 Views Right  10/26/2012   *RADIOLOGY REPORT*  Clinical Data: Knee pain secondary to a fall.  Previous knee prosthesis.  RIGHT KNEE - COMPLETE 4+ VIEW  Comparison: 03/13/2012  Findings: There  is no fracture or dislocation or other acute abnormality.  The knee prosthesis is in good position with no evidence of loosening.  No acute soft tissue abnormality.  IMPRESSION: No acute abnormality.  Osteopenia.   Original Report Authenticated By: Francene Boyers, M.D.   3:14 PM Dr Blinda Leatherwood made aware of the patient, will also see patient.   1. Right rib fracture, closed, initial encounter   2. Fall (on)(from) incline, initial encounter     MDM  67 year old patient with pain after mechanical fall.  Pt is tender of ribs that xray questions may be fractured.  Pt has incentive spirometer at home.  Definitive fracture care performed.  Discussed all results with patient.  Pt given return precautions.  Pt verbalizes understanding and agrees with plan.           [place], PA-C 10/26/12 1518

## 2012-10-26 NOTE — ED Provider Notes (Addendum)
Medical screening examination/treatment/procedure(s) were conducted as a shared visit with non-physician practitioner(s) and myself.  I personally evaluated the patient during the encounter.  Patient's examination reveals slight tenderness on the right anterior chest wall, abdominal exam benign. Patient had a level fall, workup consistent with possible rib fracture. Treated with analgesia.  Gilda Crease, MD 10/26/12 1526  Gilda Crease, MD 10/26/12 [phone]

## 2012-10-26 NOTE — ED Notes (Signed)
Pt was in front of the main entrance and fell while going to her car states that she just slipped and fell. C/o rt hip pain. Pt has had previous bil knee surgery. Pt was able to stand. No visible open wounds or abrasions.

## 2012-10-26 NOTE — ED Notes (Signed)
Pt was leaving hospital, was getting in her car.  States that she lost her balance and fell onto her right side.  C/o right knee and right hip pain.  Denies LOC.

## 2012-11-04 ENCOUNTER — Encounter: Payer: Self-pay | Admitting: Gynecologic Oncology

## 2012-11-04 ENCOUNTER — Ambulatory Visit: Payer: Medicare Other | Attending: Gynecologic Oncology | Admitting: Gynecologic Oncology

## 2012-11-04 VITALS — BP 130/76 | HR 72 | Temp 98.1°F | Resp 16 | Ht 65.0 in | Wt 249.5 lb

## 2012-11-04 DIAGNOSIS — C4499 Other specified malignant neoplasm of skin, unspecified: Secondary | ICD-10-CM

## 2012-11-04 DIAGNOSIS — E039 Hypothyroidism, unspecified: Secondary | ICD-10-CM | POA: Insufficient documentation

## 2012-11-04 DIAGNOSIS — M47817 Spondylosis without myelopathy or radiculopathy, lumbosacral region: Secondary | ICD-10-CM | POA: Insufficient documentation

## 2012-11-04 DIAGNOSIS — K219 Gastro-esophageal reflux disease without esophagitis: Secondary | ICD-10-CM | POA: Insufficient documentation

## 2012-11-04 DIAGNOSIS — Z79899 Other long term (current) drug therapy: Secondary | ICD-10-CM | POA: Insufficient documentation

## 2012-11-04 DIAGNOSIS — Z853 Personal history of malignant neoplasm of breast: Secondary | ICD-10-CM | POA: Insufficient documentation

## 2012-11-04 DIAGNOSIS — Z9010 Acquired absence of unspecified breast and nipple: Secondary | ICD-10-CM | POA: Insufficient documentation

## 2012-11-04 DIAGNOSIS — Z9089 Acquired absence of other organs: Secondary | ICD-10-CM | POA: Insufficient documentation

## 2012-11-04 DIAGNOSIS — C519 Malignant neoplasm of vulva, unspecified: Secondary | ICD-10-CM

## 2012-11-04 NOTE — Patient Instructions (Signed)
Call for bleeding

## 2012-11-04 NOTE — Progress Notes (Signed)
Consult Note: Gyn-Onc  [name] 67 y.o. female  CC:  Chief Complaint  Patient presents with  . Paget's Disease    Follow up    HPI: Patient is a 67 year old who was last seen by our service in April 2012. At that time she had a history of a partial simple vulvectomy in February of 2012 her vulvar Paget's disease. Some of the surgical margins were positive. Her postoperative course was uncomplicated. She was last seen by our service as stated above April 2012. At that time there was no evidence of any lesions consistent with Paget's. However, there is considerable amount of excoriations throughout the entire vulvar and medial thighs consistent with chronic vulvitis. Since we last saw her she had quite an eventful year in that she had a incarcerated hernia repaired in December of last year due to bowel obstruction by Dr. Johna Moreno with permanent mesh. In May of this year she also had a TVT bladder by Dr. Osborn Moreno. She does continue to wear a pad and also takes Detrol twice daily but states that her urinary leakage is markedly improved. She describes the vulvar irritation is intermittent itching with burning and that it has increased over time as has the pruritus. She does use a steroid cream twice daily and does scratch her vulva.   Interval History:  She underwent a wide local excision of the left vulva on January 2 of year 2014. Operative findings included 2 separate 3 x 3 and 1.2 cm lesions consistent with Paget's on the left.  Pathology revealed 2 foci of extramammary Paget's disease extending to the left inked margin in the right and anterior tip margins at multiple foci. The posterior margin was negative for malignancy.   Last saw her for her postoperative check in February. She comes in is complaining of "bleeding" and scratching her left vulva. She did see blood yesterday. She is using the Lotrisone cream without significant relief. She is doing her sitz baths. This episode of  bleeding that she saw yesterday was the first time that she's been doing quite some time. She's been seen by Dr. Hinton Moreno in urology and is on a new medication for her bladder that is helping her much more significantly. Last week she fell and broke her right leg. She was waiting in valet parking and hit her right knee and elbow was seen in the emergency room. She complains about a week history of sick headache "" in her left side of her head feels known. She's not discussed this with [name] her primary physician. She has lost about 10 pounds since we last saw her and that has been unintentional.   Current Meds:  Outpatient Encounter Prescriptions as of 11/04/2012  Medication Sig Dispense Refill  . buPROPion (WELLBUTRIN XL) 150 MG 24 hr tablet Take 150 mg by mouth 2 (two) times daily.        . Calcium Carbonate-Vitamin D 600-125 MG-UNIT TABS Take 2 tablets by mouth daily.      . citalopram (CELEXA) 40 MG tablet Take 20-40 mg by mouth 2 (two) times daily. Pt takes 1 tablet in the morning for 40 mg. 1/2 tablet at bedtime for 20 mg dose to equal a total dose of 60 mg      . docusate sodium (COLACE) 100 MG capsule Take 300 mg by mouth at bedtime.      Marland Kitchen GREEN COFFEE BEAN PO Take 1 tablet by mouth daily.       Marland Kitchen  levothyroxine (SYNTHROID, LEVOTHROID) 175 MCG tablet Take 175 mcg by mouth every morning.       . Multiple Vitamins-Minerals (WOMENS 50+ MULTI VITAMIN/MIN PO) Take 1 tablet by mouth daily.      . Omega-3 Fatty Acids (FISH OIL) 1200 MG CAPS Take 2 capsules by mouth daily.       Marland Kitchen OVER THE COUNTER MEDICATION Take 125 mg by mouth 3 (three) times daily.      . pantoprazole (PROTONIX) 40 MG tablet Take 40 mg by mouth 2 (two) times daily.       . Polyethylene Glycol 3350 (MIRALAX PO) Take 17 g by mouth 3 (three) times daily.       . solifenacin (VESICARE) 5 MG tablet Take 5 mg by mouth daily.      Marland Kitchen albuterol (PROVENTIL, VENTOLIN) (5 MG/ML) 0.5% NEBU Use as directed 5 mg/hr in the mouth or throat  continuous.      Marland Kitchen HYDROcodone-acetaminophen (NORCO/VICODIN) 5-325 MG per tablet Take 1 tablet by mouth every 4 (four) hours as needed for pain.  15 tablet  0  . nystatin-triamcinolone (MYCOLOG II) cream Apply 1 application topically 3 (three) times daily as needed.       No facility-administered encounter medications on file as of 11/04/2012.    Allergy:  Allergies  Allergen Reactions  . Demerol (Meperidine)   . Diazepam Other (See Comments)    Knocks me out   . Tape     "thick clear plastic tape" causes blisters  . Morphine And Related Itching and Rash  . Penicillins Rash    Social Hx:   History   Social History  . Marital Status: Married    Spouse Name: N/A    Number of Children: N/A  . Years of Education: N/A   Occupational History  . Not on file.   Social History Main Topics  . Smoking status: Former Smoker    Types: Cigarettes    Quit date: 06/05/1999  . Smokeless tobacco: Never Used  . Alcohol Use: No  . Drug Use: No  . Sexually Active: No   Other Topics Concern  . Not on file   Social History Narrative  . No narrative on file    Past Surgical Hx:  Past Surgical History  Procedure Laterality Date  . Breast surgery    . Breast lumpectomy    . Mastectomy  1997    right breast  . Cholecystectomy    . Knee surgery      x5  . Joint replacement      right knee x2  . Joint replacement      left knee x5  . Back surgery    . Foot surgery      fused left foot  . Tonsillectomy  68  . Thyroid surgery    . Hand tendon surgery      right hand tendon tear  . Rotator cuff repair      right rotator cuff  . Thyroidectomy    . Incisional hernia repair  05/05/2011    Procedure: LAPAROSCOPIC INCISIONAL HERNIA;  Surgeon: Vanessa Saa, MD;  Location: [facility];  Service: General;  Laterality: N/A;  LAPAROSCOPIC REPAIR INCARCERATED INCISIONAL HERNIA  WITH MESH  . Hernia repair  05/05/11    emergency VH and incisional hernia repair   . Bladder suspension   10/13/2011    Procedure: TRANSVAGINAL TAPE (TVT) PROCEDURE;  Surgeon: Vanessa Nails, MD;  Location: [facility];  Service: Gynecology;  Laterality: N/A;  . Cystoscopy  10/13/2011    Procedure: CYSTOSCOPY;  Surgeon: Vanessa Nails, MD;  Location: [facility];  Service: Gynecology;  Laterality: Bilateral;  . Vulvectomy  05/27/2012    Procedure: WIDE EXCISION VULVECTOMY;  Surgeon: Rejeana Brock A. Duard Brady, MD;  Location: [facility];  Service: Gynecology;  Laterality: N/A;  Wide Local Excision of Vulva     Past Medical Hx:  Past Medical History  Diagnosis Date  . Cancer   . Asthma   . Blood transfusion   . GERD (gastroesophageal reflux disease)   . Thyroid disease   . Allergy   . Hernia   . PONV (postoperative nausea and vomiting)   . Breast cancer, stage 2 05/28/2011  . 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 Hypothyroid 05/28/2011  . GERD (gastroesophageal reflux disease) 05/28/2011  . Fibromyalgia 05/28/2011  . 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 Mild depression 05/28/2011  . Thrombocytopenia, unspecified 05/28/2011  . Hx of migraines   . Bowel obstruction     Family Hx:  Family History  Problem Relation Age of Onset  . Cancer Father 61    lung and kidney   . Alzheimer's disease Mother   . Alcohol abuse Brother   . Heart attack Maternal Grandmother     Vitals:  Blood pressure 130/76, pulse 72, temperature 98.1 F (36.7 C), resp. rate 16, height 5\' 5"  (1.651 m), weight 249 lb 8 oz (113.172 kg).  Physical Exam: Well-nourished well-developed female in no acute distress.   Pelvic: External genitalia status post excision of the left labia. The area is well-healed. There is a patch measuring 2 x 1 cm on the left labia. After obtaining the patient's verbal consent the area was infiltrated with 1 cc of lidocaine. A punch biopsy was performed without difficulty. Hemostasis was obtained with silver nitrate. 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 We will call her with the results of the biopsy next week. She was instructed call Dr. Yehuda Budd office today and discussed with him headaches in the fall she had last week.   Breiona Couvillon A., MD 11/04/2012, 3:41 PM

## 2012-11-09 ENCOUNTER — Telehealth: Payer: Self-pay | Admitting: Gynecologic Oncology

## 2012-11-09 NOTE — Telephone Encounter (Signed)
Patient informed of biopsy results and Dr. Denman George recommendations for a repeat WLE.  Verbalizing understanding.  Instructed that she would be contacted with the surgery date and time.

## 2012-11-15 ENCOUNTER — Telehealth: Payer: Self-pay | Admitting: Gynecologic Oncology

## 2012-11-15 NOTE — Telephone Encounter (Signed)
Called to confirm upcoming surgical date with the patient.  Verbalizing understanding.  She is scheduled for a pre-op appt this Friday.  Instructed to call for any questions or concerns.

## 2012-11-18 ENCOUNTER — Encounter (HOSPITAL_COMMUNITY): Payer: Self-pay | Admitting: Pharmacy Technician

## 2012-11-19 ENCOUNTER — Encounter (HOSPITAL_COMMUNITY): Payer: Self-pay

## 2012-11-19 ENCOUNTER — Encounter (HOSPITAL_COMMUNITY)
Admission: RE | Admit: 2012-11-19 | Discharge: 2012-11-19 | Disposition: A | Discharge status: Home / Self Care | Payer: Medicare Other | Source: Ambulatory Visit | Attending: Gynecologic Oncology | Admitting: Gynecologic Oncology

## 2012-11-19 LAB — CBC
MCH: 29 pg (ref 26.0–34.0)
MCV: 88.6 fL (ref 78.0–100.0)
Platelets: 145 10*3/uL — ABNORMAL LOW (ref 150–400)
RDW: 13.7 % (ref 11.5–15.5)
WBC: 4.4 10*3/uL (ref 4.0–10.5)

## 2012-11-19 LAB — BASIC METABOLIC PANEL
CO2: 29 mEq/L (ref 19–32)
Calcium: 9.3 mg/dL (ref 8.4–10.5)
Creatinine, Ser: 0.66 mg/dL (ref 0.50–1.10)

## 2012-11-19 LAB — SURGICAL PCR SCREEN: Staphylococcus aureus: POSITIVE — AB

## 2012-11-19 NOTE — Pre-Procedure Instructions (Signed)
EKG REPORT IN EPIC FROM 05/20/12. CXR REPORT IN EPIC FROM 10/26/12.

## 2012-11-19 NOTE — Patient Instructions (Addendum)
YOUR SURGERY IS SCHEDULED AT [facility]  ON:  Tuesday July 1  REPORT TO [facility] AT:  1:30 PM      PHONE # FOR SHORT STAY IS [phone]                   CLEAR LIQUID DIET ONLY THE DAY BEFORE YOUR SURGERY.  DO NOT EAT ANYTHING AFTER MIDNIGHT THE NIGHT BEFORE YOUR SURGERY.  YOU MAY BRUSH YOUR TEETH, RINSE OUT YOUR MOUTH- NO FOOD, NO CHEWING GUM, NO MINTS, NO CANDIES, NO CHEWING TOBACCO. YOU MAY CONTINUE CLEAR LIQUIDS FROM MIDNIGHT UNTIL 10:00 AM THE DAY OF YOUR SURGERY - BUT NOTHING TO DRINK AFTER 10:00 AM.  PLEASE TAKE THE FOLLOWING MEDICATIONS THE AM OF YOUR SURGERY WITH A FEW SIPS OF WATER:  WELLBUTRIN, CELEXA, SYNTHROID, PROTONIX, AND USE ADVAIR, ALBUTEROL  IF YOU USE INHALERS--USE YOUR INHALERS THE AM OF YOUR SURGERY AND BRING INHALERS TO THE HOSPITAL.     DO NOT BRING VALUABLES, MONEY, CREDIT CARDS.  DO NOT WEAR JEWELRY, MAKE-UP, NAIL POLISH AND NO METAL PINS OR CLIPS IN YOUR HAIR. CONTACT LENS, DENTURES / PARTIALS, GLASSES SHOULD NOT BE WORN TO SURGERY AND IN MOST CASES-HEARING AIDS WILL NEED TO BE REMOVED.  BRING YOUR GLASSES CASE, ANY EQUIPMENT NEEDED FOR YOUR CONTACT LENS. FOR PATIENTS ADMITTED TO THE HOSPITAL--CHECK OUT TIME THE DAY OF DISCHARGE IS 11:00 AM.  ALL INPATIENT ROOMS ARE PRIVATE - WITH BATHROOM, TELEPHONE, TELEVISION AND WIFI INTERNET.  IF YOU ARE BEING DISCHARGED THE SAME DAY OF YOUR SURGERY--YOU CAN NOT DRIVE YOURSELF HOME--AND SHOULD NOT GO HOME ALONE BY TAXI OR BUS.  NO DRIVING OR OPERATING MACHINERY FOR 24 HOURS FOLLOWING ANESTHESIA / PAIN MEDICATIONS.  PLEASE MAKE ARRANGEMENTS FOR SOMEONE TO BE WITH YOU AT HOME THE FIRST 24 HOURS AFTER SURGERY. RESPONSIBLE DRIVER'S NAME  HUSBAND MIKE WILL BE WITH PT                                               PHONE #   [phone]    REMEMBER TO DO YOUR DEEP BREATHING, COUGHING, TURNING, AND LEG EXERCISES AFTER SURGERY.                           PLEASE READ OVER ANY  FACT SHEETS THAT YOU WERE  GIVEN: MRSA INFORMATION,  INCENTIVE SPIROMETER INFORMATION. FAILURE TO FOLLOW THESE INSTRUCTIONS MAY RESULT IN THE CANCELLATION OF YOUR SURGERY.   PATIENT SIGNATURE_________________________________

## 2012-11-22 ENCOUNTER — Telehealth: Payer: Self-pay | Admitting: Gynecologic Oncology

## 2012-11-22 NOTE — Telephone Encounter (Signed)
Telephone call to check on pre-operative status.  Message left.  Instructed to call for any needs.

## 2012-11-23 ENCOUNTER — Encounter (HOSPITAL_COMMUNITY): Payer: Self-pay

## 2012-11-23 ENCOUNTER — Ambulatory Visit (HOSPITAL_COMMUNITY): Payer: Medicare Other | Admitting: Anesthesiology

## 2012-11-23 ENCOUNTER — Encounter (HOSPITAL_COMMUNITY): Payer: Self-pay | Admitting: Anesthesiology

## 2012-11-23 ENCOUNTER — Ambulatory Visit (HOSPITAL_COMMUNITY)
Admission: RE | Admit: 2012-11-23 | Discharge: 2012-11-23 | Disposition: A | Discharge status: Home / Self Care | Payer: Medicare Other | Source: Ambulatory Visit | Attending: Gynecologic Oncology | Admitting: Gynecologic Oncology

## 2012-11-23 ENCOUNTER — Encounter (HOSPITAL_COMMUNITY)
Admission: RE | Disposition: A | Discharge status: Home / Self Care | Payer: Self-pay | Source: Ambulatory Visit | Attending: Gynecologic Oncology

## 2012-11-23 DIAGNOSIS — J45909 Unspecified asthma, uncomplicated: Secondary | ICD-10-CM | POA: Insufficient documentation

## 2012-11-23 DIAGNOSIS — D696 Thrombocytopenia, unspecified: Secondary | ICD-10-CM | POA: Insufficient documentation

## 2012-11-23 DIAGNOSIS — Z87891 Personal history of nicotine dependence: Secondary | ICD-10-CM | POA: Insufficient documentation

## 2012-11-23 DIAGNOSIS — N904 Leukoplakia of vulva: Secondary | ICD-10-CM

## 2012-11-23 DIAGNOSIS — C519 Malignant neoplasm of vulva, unspecified: Secondary | ICD-10-CM | POA: Insufficient documentation

## 2012-11-23 DIAGNOSIS — Z9109 Other allergy status, other than to drugs and biological substances: Secondary | ICD-10-CM | POA: Insufficient documentation

## 2012-11-23 DIAGNOSIS — Z880 Allergy status to penicillin: Secondary | ICD-10-CM | POA: Insufficient documentation

## 2012-11-23 DIAGNOSIS — Z801 Family history of malignant neoplasm of trachea, bronchus and lung: Secondary | ICD-10-CM | POA: Insufficient documentation

## 2012-11-23 DIAGNOSIS — Z888 Allergy status to other drugs, medicaments and biological substances status: Secondary | ICD-10-CM | POA: Insufficient documentation

## 2012-11-23 DIAGNOSIS — Z885 Allergy status to narcotic agent status: Secondary | ICD-10-CM | POA: Insufficient documentation

## 2012-11-23 DIAGNOSIS — E079 Disorder of thyroid, unspecified: Secondary | ICD-10-CM | POA: Insufficient documentation

## 2012-11-23 DIAGNOSIS — Z8051 Family history of malignant neoplasm of kidney: Secondary | ICD-10-CM | POA: Insufficient documentation

## 2012-11-23 DIAGNOSIS — Z853 Personal history of malignant neoplasm of breast: Secondary | ICD-10-CM | POA: Insufficient documentation

## 2012-11-23 DIAGNOSIS — Z79899 Other long term (current) drug therapy: Secondary | ICD-10-CM | POA: Insufficient documentation

## 2012-11-23 DIAGNOSIS — C4499 Other specified malignant neoplasm of skin, unspecified: Secondary | ICD-10-CM

## 2012-11-23 DIAGNOSIS — K219 Gastro-esophageal reflux disease without esophagitis: Secondary | ICD-10-CM | POA: Insufficient documentation

## 2012-11-23 DIAGNOSIS — IMO0001 Reserved for inherently not codable concepts without codable children: Secondary | ICD-10-CM | POA: Insufficient documentation

## 2012-11-23 DIAGNOSIS — M47817 Spondylosis without myelopathy or radiculopathy, lumbosacral region: Secondary | ICD-10-CM | POA: Insufficient documentation

## 2012-11-23 HISTORY — PX: VULVECTOMY: SHX1086

## 2012-11-23 LAB — GLUCOSE, CAPILLARY: Glucose-Capillary: 98 mg/dL (ref 70–99)

## 2012-11-23 SURGERY — WIDE EXCISION VULVECTOMY
Anesthesia: General | Site: Vagina | Wound class: Clean Contaminated

## 2012-11-23 MED ORDER — HYDROCODONE-ACETAMINOPHEN 5-325 MG PO TABS: 2.0000 | ORAL_TABLET | Freq: Four times a day (QID) | ORAL | Status: DC | PRN

## 2012-11-23 MED ORDER — ACETIC ACID 5 % SOLN
Status: AC
  Filled 2012-11-23: qty 500

## 2012-11-23 MED ORDER — FENTANYL CITRATE 0.05 MG/ML IJ SOLN: 25.0000 ug | INTRAMUSCULAR | Status: DC | PRN

## 2012-11-23 MED ORDER — BUPIVACAINE HCL 0.25 % IJ SOLN
INTRAMUSCULAR | Status: DC | PRN
  Administered 2012-11-23: 10 mL

## 2012-11-23 MED ORDER — ONDANSETRON HCL 4 MG/2ML IJ SOLN
INTRAMUSCULAR | Status: DC | PRN
  Administered 2012-11-23: 4 mg via INTRAVENOUS

## 2012-11-23 MED ORDER — SODIUM CHLORIDE 0.9 % IJ SOLN: 3.0000 mL | Freq: Two times a day (BID) | INTRAMUSCULAR | Status: DC

## 2012-11-23 MED ORDER — PROMETHAZINE HCL 25 MG/ML IJ SOLN: 6.2500 mg | INTRAMUSCULAR | Status: DC | PRN

## 2012-11-23 MED ORDER — SUCCINYLCHOLINE CHLORIDE 20 MG/ML IJ SOLN
INTRAMUSCULAR | Status: DC | PRN
  Administered 2012-11-23: 100 mg via INTRAVENOUS

## 2012-11-23 MED ORDER — OXYCODONE HCL 5 MG PO TABS
5.0000 mg | ORAL_TABLET | ORAL | Status: DC | PRN
  Administered 2012-11-23: 5 mg via ORAL
  Filled 2012-11-23: qty 1

## 2012-11-23 MED ORDER — ACETAMINOPHEN 325 MG PO TABS: 650.0000 mg | ORAL_TABLET | ORAL | Status: DC | PRN

## 2012-11-23 MED ORDER — LACTATED RINGERS IV SOLN
INTRAVENOUS | Status: DC
  Administered 2012-11-23: 1000 mL via INTRAVENOUS

## 2012-11-23 MED ORDER — ACETAMINOPHEN 650 MG RE SUPP
650.0000 mg | RECTAL | Status: DC | PRN
  Filled 2012-11-23: qty 1

## 2012-11-23 MED ORDER — ONDANSETRON HCL 4 MG/2ML IJ SOLN: 4.0000 mg | Freq: Four times a day (QID) | INTRAMUSCULAR | Status: DC | PRN

## 2012-11-23 MED ORDER — SODIUM CHLORIDE 0.9 % IJ SOLN: 3.0000 mL | INTRAMUSCULAR | Status: DC | PRN

## 2012-11-23 MED ORDER — FENTANYL CITRATE 0.05 MG/ML IJ SOLN
INTRAMUSCULAR | Status: DC | PRN
  Administered 2012-11-23 (×2): 100 ug via INTRAVENOUS

## 2012-11-23 MED ORDER — PROPOFOL 10 MG/ML IV BOLUS
INTRAVENOUS | Status: DC | PRN
  Administered 2012-11-23: 200 mg via INTRAVENOUS

## 2012-11-23 MED ORDER — BUPIVACAINE HCL (PF) 0.25 % IJ SOLN
INTRAMUSCULAR | Status: AC
  Filled 2012-11-23: qty 30

## 2012-11-23 MED ORDER — SODIUM CHLORIDE 0.9 % IV SOLN: 250.0000 mL | INTRAVENOUS | Status: DC | PRN

## 2012-11-23 MED ORDER — LIDOCAINE HCL (PF) 2 % IJ SOLN
INTRAMUSCULAR | Status: DC | PRN
  Administered 2012-11-23: 75 mg

## 2012-11-23 SURGICAL SUPPLY — 34 items
BLADE SURG 15 STRL LF DISP TIS (BLADE) ×2 IMPLANT
BLADE SURG 15 STRL SS (BLADE) ×2
CANISTER SUCTION 2500CC (MISCELLANEOUS) ×2 IMPLANT
CLOTH BEACON ORANGE TIMEOUT ST (SAFETY) ×2 IMPLANT
COVER SURGICAL LIGHT HANDLE (MISCELLANEOUS) ×2 IMPLANT
DRAPE LG THREE QUARTER DISP (DRAPES) ×3 IMPLANT
DRAPE SURG IRRIG POUCH 19X23 (DRAPES) ×1 IMPLANT
GAUZE SPONGE 4X4 16PLY XRAY LF (GAUZE/BANDAGES/DRESSINGS) ×3 IMPLANT
GLOVE BIO SURGEON STRL SZ 6.5 (GLOVE) ×4 IMPLANT
GLOVE BIO SURGEON STRL SZ7.5 (GLOVE) ×2 IMPLANT
GLOVE BIOGEL PI IND STRL 7.0 (GLOVE) ×1 IMPLANT
GLOVE BIOGEL PI INDICATOR 7.0 (GLOVE) ×1
GOWN STRL NON-REIN LRG LVL3 (GOWN DISPOSABLE) ×3 IMPLANT
NEEDLE HYPO 22GX1.5 SAFETY (NEEDLE) ×2 IMPLANT
NS IRRIG 1000ML POUR BTL (IV SOLUTION) ×2 IMPLANT
PACK COOL COMFORT PERI COLD (SET/KITS/TRAYS/PACK) ×5 IMPLANT
PACK LITHOTOMY IV (CUSTOM PROCEDURE TRAY) ×2 IMPLANT
PACK MINOR VAGINAL W LONG (CUSTOM PROCEDURE TRAY) ×1 IMPLANT
PENCIL BUTTON HOLSTER BLD 10FT (ELECTRODE) ×2 IMPLANT
SPONGE GAUZE 4X4 12PLY (GAUZE/BANDAGES/DRESSINGS) ×2 IMPLANT
SUT VIC AB 2-0 CT2 27 (SUTURE) ×4 IMPLANT
SUT VIC AB 2-0 SH 27 (SUTURE) ×2
SUT VIC AB 2-0 SH 27X BRD (SUTURE) IMPLANT
SUT VIC AB 3-0 PS2 18 (SUTURE) ×2
SUT VIC AB 3-0 PS2 18XBRD (SUTURE) IMPLANT
SUT VIC AB 3-0 SH 27 (SUTURE) ×6
SUT VIC AB 3-0 SH 27X BRD (SUTURE) IMPLANT
SUT VICRYL 2 0 18  UND BR (SUTURE)
SUT VICRYL 2 0 18 UND BR (SUTURE) ×1 IMPLANT
SYR CONTROL 10ML LL (SYRINGE) ×2 IMPLANT
TOWEL OR 17X26 10 PK STRL BLUE (TOWEL DISPOSABLE) ×3 IMPLANT
TRAY FOLEY CATH 14FRSI W/METER (CATHETERS) ×2 IMPLANT
WATER STERILE IRR 1500ML POUR (IV SOLUTION) ×2 IMPLANT
YANKAUER SUCT BULB TIP 10FT TU (MISCELLANEOUS) ×2 IMPLANT

## 2012-11-23 NOTE — Anesthesia Preprocedure Evaluation (Signed)
Anesthesia Evaluation  Patient identified by MRN, date of birth, ID band Patient awake    Reviewed: Allergy & Precautions, H&P , NPO status , Patient's Chart, lab work & pertinent test results  History of Anesthesia Complications (+) PONV  Airway Mallampati: II TM Distance: >3 FB Neck ROM: full    Dental  (+) Missing, Loose and Dental Advisory Given,    Pulmonary asthma ,  breath sounds clear to auscultation  Pulmonary exam normal       Cardiovascular Exercise Tolerance: Good negative cardio ROS  Rhythm:regular Rate:Normal     Neuro/Psych PSYCHIATRIC DISORDERS Depression negative neurological ROS  negative psych ROS   GI/Hepatic negative GI ROS, Neg liver ROS, GERD-  Medicated and Controlled,  Endo/Other  negative endocrine ROSMorbid obesity  Renal/GU negative Renal ROS  negative genitourinary   Musculoskeletal  (+) Fibromyalgia -  Abdominal   Peds  Hematology negative hematology ROS (+)   Anesthesia Other Findings   Reproductive/Obstetrics negative OB ROS                           Anesthesia Physical  Anesthesia Plan  ASA: III  Anesthesia Plan: General   Post-op Pain Management:    Induction: Intravenous, Rapid sequence and Cricoid pressure planned  Airway Management Planned: Oral ETT  Additional Equipment:   Intra-op Plan:   Post-operative Plan: Extubation in OR  Informed Consent: I have reviewed the patients History and Physical, chart, labs and discussed the procedure including the risks, benefits and alternatives for the proposed anesthesia with the patient or authorized representative who has indicated his/her understanding and acceptance.   Dental Advisory Given  Plan Discussed with: CRNA and Surgeon  Anesthesia Plan Comments:         Anesthesia Quick Evaluation

## 2012-11-23 NOTE — H&P (View-Only) (Signed)
Consult Note: Gyn-Onc  [name] 67 y.o. female  CC:  Chief Complaint  Patient presents with  . Paget's Disease    Follow up    HPI: Patient is a 67 year old who was last seen by our service in April 2012. At that time she had a history of a partial simple vulvectomy in February of 2012 her vulvar Paget's disease. Some of the surgical margins were positive. Her postoperative course was uncomplicated. She was last seen by our service as stated above April 2012. At that time there was no evidence of any lesions consistent with Paget's. However, there is considerable amount of excoriations throughout the entire vulvar and medial thighs consistent with chronic vulvitis. Since we last saw her she had quite an eventful year in that she had a incarcerated hernia repaired in December of last year due to bowel obstruction by Dr. Johna Sheriff with permanent mesh. In May of this year she also had a TVT bladder by Dr. Osborn Coho. She does continue to wear a pad and also takes Detrol twice daily but states that her urinary leakage is markedly improved. She describes the vulvar irritation is intermittent itching with burning and that it has increased over time as has the pruritus. She does use a steroid cream twice daily and does scratch her vulva.   Interval History:  She underwent a wide local excision of the left vulva on January 2 of year 2014. Operative findings included 2 separate 3 x 3 and 1.2 cm lesions consistent with Paget's on the left.  Pathology revealed 2 foci of extramammary Paget's disease extending to the left inked margin in the right and anterior tip margins at multiple foci. The posterior margin was negative for malignancy.   Last saw her for her postoperative check in February. She comes in is complaining of "bleeding" and scratching her left vulva. She did see blood yesterday. She is using the Lotrisone cream without significant relief. She is doing her sitz baths. This episode of  bleeding that she saw yesterday was the first time that she's been doing quite some time. She's been seen by Dr. Hinton Rao in urology and is on a new medication for her bladder that is helping her much more significantly. Last week she fell and broke her right leg. She was waiting in valet parking and hit her right knee and elbow was seen in the emergency room. She complains about a week history of sick headache "" in her left side of her head feels known. She's not discussed this with Dr. Collins Scotland her primary physician. She has lost about 10 pounds since we last saw her and that has been unintentional.   Current Meds:  Outpatient Encounter Prescriptions as of 11/04/2012  Medication Sig Dispense Refill  . buPROPion (WELLBUTRIN XL) 150 MG 24 hr tablet Take 150 mg by mouth 2 (two) times daily.        . Calcium Carbonate-Vitamin D 600-125 MG-UNIT TABS Take 2 tablets by mouth daily.      . citalopram (CELEXA) 40 MG tablet Take 20-40 mg by mouth 2 (two) times daily. Pt takes 1 tablet in the morning for 40 mg. 1/2 tablet at bedtime for 20 mg dose to equal a total dose of 60 mg      . docusate sodium (COLACE) 100 MG capsule Take 300 mg by mouth at bedtime.      Marland Kitchen GREEN COFFEE BEAN PO Take 1 tablet by mouth daily.       Marland Kitchen  levothyroxine (SYNTHROID, LEVOTHROID) 175 MCG tablet Take 175 mcg by mouth every morning.       . Multiple Vitamins-Minerals (WOMENS 50+ MULTI VITAMIN/MIN PO) Take 1 tablet by mouth daily.      . Omega-3 Fatty Acids (FISH OIL) 1200 MG CAPS Take 2 capsules by mouth daily.       Marland Kitchen OVER THE COUNTER MEDICATION Take 125 mg by mouth 3 (three) times daily.      . pantoprazole (PROTONIX) 40 MG tablet Take 40 mg by mouth 2 (two) times daily.       . Polyethylene Glycol 3350 (MIRALAX PO) Take 17 g by mouth 3 (three) times daily.       . solifenacin (VESICARE) 5 MG tablet Take 5 mg by mouth daily.      Marland Kitchen albuterol (PROVENTIL, VENTOLIN) (5 MG/ML) 0.5% NEBU Use as directed 5 mg/hr in the mouth or throat  continuous.      Marland Kitchen HYDROcodone-acetaminophen (NORCO/VICODIN) 5-325 MG per tablet Take 1 tablet by mouth every 4 (four) hours as needed for pain.  15 tablet  0  . nystatin-triamcinolone (MYCOLOG II) cream Apply 1 application topically 3 (three) times daily as needed.       No facility-administered encounter medications on file as of 11/04/2012.    Allergy:  Allergies  Allergen Reactions  . Demerol (Meperidine)   . Diazepam Other (See Comments)    Knocks me out   . Tape     "thick clear plastic tape" causes blisters  . Morphine And Related Itching and Rash  . Penicillins Rash    Social Hx:   History   Social History  . Marital Status: Married    Spouse Name: N/A    Number of Children: N/A  . Years of Education: N/A   Occupational History  . Not on file.   Social History Main Topics  . Smoking status: Former Smoker    Types: Cigarettes    Quit date: 06/05/1999  . Smokeless tobacco: Never Used  . Alcohol Use: No  . Drug Use: No  . Sexually Active: No   Other Topics Concern  . Not on file   Social History Narrative  . No narrative on file    Past Surgical Hx:  Past Surgical History  Procedure Laterality Date  . Breast surgery    . Breast lumpectomy    . Mastectomy  1997    right breast  . Cholecystectomy    . Knee surgery      x5  . Joint replacement      right knee x2  . Joint replacement      left knee x5  . Back surgery    . Foot surgery      fused left foot  . Tonsillectomy  68  . Thyroid surgery    . Hand tendon surgery      right hand tendon tear  . Rotator cuff repair      right rotator cuff  . Thyroidectomy    . Incisional hernia repair  05/05/2011    Procedure: LAPAROSCOPIC INCISIONAL HERNIA;  Surgeon: Mariella Saa, MD;  Location: [facility];  Service: General;  Laterality: N/A;  LAPAROSCOPIC REPAIR INCARCERATED INCISIONAL HERNIA  WITH MESH  . Hernia repair  05/05/11    emergency VH and incisional hernia repair   . Bladder suspension   10/13/2011    Procedure: TRANSVAGINAL TAPE (TVT) PROCEDURE;  Surgeon: Purcell Nails, MD;  Location: [facility];  Service: Gynecology;  Laterality: N/A;  . Cystoscopy  10/13/2011    Procedure: CYSTOSCOPY;  Surgeon: Purcell Nails, MD;  Location: [facility];  Service: Gynecology;  Laterality: Bilateral;  . Vulvectomy  05/27/2012    Procedure: WIDE EXCISION VULVECTOMY;  Surgeon: Rejeana Brock A. Duard Brady, MD;  Location: [facility];  Service: Gynecology;  Laterality: N/A;  Wide Local Excision of Vulva     Past Medical Hx:  Past Medical History  Diagnosis Date  . Cancer   . Asthma   . Blood transfusion   . GERD (gastroesophageal reflux disease)   . Thyroid disease   . Allergy   . Hernia   . PONV (postoperative nausea and vomiting)   . Breast cancer, stage 2 05/28/2011  . 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 Hypothyroid 05/28/2011  . GERD (gastroesophageal reflux disease) 05/28/2011  . Fibromyalgia 05/28/2011  . 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 Mild depression 05/28/2011  . Thrombocytopenia, unspecified 05/28/2011  . Hx of migraines   . Bowel obstruction     Family Hx:  Family History  Problem Relation Age of Onset  . Cancer Father 77    lung and kidney   . Alzheimer's disease Mother   . Alcohol abuse Brother   . Heart attack Maternal Grandmother     Vitals:  Blood pressure 130/76, pulse 72, temperature 98.1 F (36.7 C), resp. rate 16, height 5\' 5"  (1.651 m), weight 249 lb 8 oz (113.172 kg).  Physical Exam: Well-nourished well-developed female in no acute distress.   Pelvic: External genitalia status post excision of the left labia. The area is well-healed. There is a patch measuring 2 x 1 cm on the left labia. After obtaining the patient's verbal consent the area was infiltrated with 1 cc of lidocaine. A punch biopsy was performed without difficulty. Hemostasis was obtained with silver nitrate. 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 We will call her with the results of the biopsy next week. She was instructed call Dr. Yehuda Budd office today and discussed with him headaches in the fall she had last week.   Sergey Ishler A., MD 11/04/2012, 3:41 PM

## 2012-11-23 NOTE — Transfer of Care (Signed)
Immediate Anesthesia Transfer of Care Note  Patient: [name]  Procedure(s) Performed: Procedure(s) with comments: WIDE LOCAL EXCISION OF THE VULVA (N/A) - left inferior vulvar biopsy excision left superior lesion left inferior vulvar excision  Patient Location: PACU  Anesthesia Type:General  Level of Consciousness: awake, alert , oriented and patient cooperative  Airway & Oxygen Therapy: Patient Spontanous Breathing and Patient connected to face mask oxygen  Post-op Assessment: Report given to PACU RN, Post -op Vital signs reviewed and stable and Patient moving all extremities X 4  Post vital signs: Reviewed and stable  Complications: No apparent anesthesia complications

## 2012-11-23 NOTE — Op Note (Signed)
PATIENT: ERIYAH FERNANDO DATE OF BIRTH: 02/21/1946 ENCOUNTER DATE: 11/23/2012   Preop Diagnosis: Paget's disease of the vulva  Postoperative Diagnosis: Same  Surgery: Wide local excision of the left vulva x2. Left vulvar biopsy  Surgeons:  Rejeana Brock A. Duard Brady, MD; Antionette Char, MD   Anesthesia: General   Anesthesiologist: Willa Frater  Estimated blood loss: 25 ml  IVF: 600 ml   Urine output:  NP  Complications: None   Pathology: Superior left vulvar excisions suture at 12:00. Inferior left vulvar excisions suture at 12:00. Left inferior vulvar biopsy.  Operative findings: Area of Paget's measuring approximately 2.5 x 1 cm in the superior aspect of her prior left vulvar excision. The inferior aspect of a similar lesion. 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 On the left perineal body the area is somewhat excoriated pale. Biopsy was performed in this area  Procedure: The patient was identified in the preoperative holding area. Informed consent was signed on the chart. Patient was seen history was reviewed and exam was performed.   The patient was then taken to the operating room and placed in the supine position with SCD hose on. General anesthesia was then induced without difficulty. She was then placed in the dorsolithotomy position. Perineum was prepped with Betadine.   Timeout was performed to confirm the patient, procedure, allergy, and need for antibiotics. No antibiotics were required. Acetic acid was applied. 10 cc of quarter percent Marcaine was applied and the area of the excisions. Our attention was first drawn to the inferior aspect of the perineal body. A biopsy was performed. Hemostasis was obtained using pinpoint cautery. The areas of the Paget's disease as described were again identified. An elipse was made at each of the separate incisions to visually negative margins with a knife. The skin was then elevated with the subcutaneous tissue  remaining below. Hemostasis was obtained with cautery. The skin edges were closed with 3-0 Vicryl interrupted fashion. There was adequate hemostasis.  All instrument, suture, Ray-Tec, and needle counts were correct x2. The patient tolerated the procedure well and was taken recovery room in stable condition. This is Cleda Mccreedy dictating an operative note on MAGIE CIAMPA.

## 2012-11-23 NOTE — Anesthesia Postprocedure Evaluation (Signed)
  Anesthesia Post-op Note  Patient: [name]  Procedure(s) Performed: Procedure(s) (LRB): WIDE LOCAL EXCISION OF THE VULVA (N/A)  Patient Location: PACU  Anesthesia Type: General  Level of Consciousness: awake and alert   Airway and Oxygen Therapy: Patient Spontanous Breathing  Post-op Pain: mild  Post-op Assessment: Post-op Vital signs reviewed, Patient's Cardiovascular Status Stable, Respiratory Function Stable, Patent Airway and No signs of Nausea or vomiting  Last Vitals:  Filed Vitals:   11/23/12 1700  BP: 112/65  Pulse: 81  Temp: 37.2 C  Resp: 16    Post-op Vital Signs: stable   Complications: No apparent anesthesia complications

## 2012-11-23 NOTE — Interval H&P Note (Signed)
History and Physical Interval Note:  11/23/2012 3:12 PM  [name]  has presented today for surgery, with the diagnosis of PAGETS DISEASE  The various methods of treatment have been discussed with the patient and family. After consideration of risks, benefits and other options for treatment, the patient has consented to  Procedure(s): WIDE LOCAL EXCISION OF THE VULVA (N/A) as a surgical intervention .  The patient's history has been reviewed, patient examined, no change in status, stable for surgery.  I have reviewed the patient's chart and labs.  Questions were answered to the patient's satisfaction.     Nyal Schachter A.

## 2012-11-24 ENCOUNTER — Telehealth: Payer: Self-pay | Admitting: Gynecologic Oncology

## 2012-11-24 ENCOUNTER — Encounter (HOSPITAL_COMMUNITY): Payer: Self-pay | Admitting: Gynecologic Oncology

## 2012-11-24 NOTE — Telephone Encounter (Signed)
Post op telephone call to check patient status.  Patient describes expected post operative status.  Adequate PO intake reported.  Bowels and bladder functioning without difficulty.  Pain minimal.  Reportable signs and symptoms reviewed.  Follow up appt given.   

## 2012-11-25 ENCOUNTER — Telehealth: Payer: Self-pay | Admitting: Gynecologic Oncology

## 2012-11-25 NOTE — Telephone Encounter (Signed)
Patient informed of final pathology results.  Informed that she would be contacted with further recommendations when available from Dr. Duard Brady.  Instructed to call for any questions or concerns.

## 2012-12-02 ENCOUNTER — Ambulatory Visit: Payer: Medicare Other | Attending: Gynecologic Oncology | Admitting: Gynecologic Oncology

## 2012-12-02 ENCOUNTER — Encounter: Payer: Self-pay | Admitting: Gynecologic Oncology

## 2012-12-02 ENCOUNTER — Other Ambulatory Visit: Payer: Medicare Other | Admitting: Lab

## 2012-12-02 ENCOUNTER — Encounter (HOSPITAL_COMMUNITY): Payer: Medicare Other

## 2012-12-02 VITALS — BP 118/62 | HR 100 | Temp 98.2°F | Resp 18 | Ht 65.0 in | Wt 265.9 lb

## 2012-12-02 DIAGNOSIS — R609 Edema, unspecified: Secondary | ICD-10-CM

## 2012-12-02 DIAGNOSIS — R300 Dysuria: Secondary | ICD-10-CM

## 2012-12-02 LAB — URINALYSIS, MICROSCOPIC - CHCC
Nitrite: NEGATIVE
Protein: NEGATIVE mg/dL
Specific Gravity, Urine: 1.005 (ref 1.003–1.035)
Urobilinogen, UR: 0.2 mg/dL (ref 0.2–1)
pH: 6.5 (ref 4.6–8.0)

## 2012-12-02 NOTE — Patient Instructions (Addendum)
Doing well.  Continue peri-care.  We will contact you with the results of your urinalysis and doppler of the left leg.  Please call for any questions or concerns.

## 2012-12-03 ENCOUNTER — Telehealth: Payer: Self-pay | Admitting: Gynecologic Oncology

## 2012-12-03 ENCOUNTER — Ambulatory Visit (HOSPITAL_COMMUNITY)
Admission: RE | Admit: 2012-12-03 | Discharge: 2012-12-03 | Disposition: A | Discharge status: Home / Self Care | Payer: Medicare Other | Source: Ambulatory Visit | Attending: Gynecologic Oncology | Admitting: Gynecologic Oncology

## 2012-12-03 ENCOUNTER — Other Ambulatory Visit: Payer: Self-pay | Admitting: Gynecologic Oncology

## 2012-12-03 DIAGNOSIS — R609 Edema, unspecified: Secondary | ICD-10-CM

## 2012-12-03 DIAGNOSIS — R300 Dysuria: Secondary | ICD-10-CM

## 2012-12-03 DIAGNOSIS — M79609 Pain in unspecified limb: Secondary | ICD-10-CM

## 2012-12-03 DIAGNOSIS — Z9889 Other specified postprocedural states: Secondary | ICD-10-CM | POA: Insufficient documentation

## 2012-12-03 NOTE — Progress Notes (Signed)
Follow Up Note: Gyn-Onc  [name] 67 y.o. female  CC:  Chief Complaint  Patient presents with  . Paget's  Disease    Follow up    HPI:  [name] is a 67 year old female who has a history of having a partial simple vulvectomy in February of 2012 along with a wide local excision of the left vulva on May 27, 2012 for Paget's disease. Operative findings for 05/27/12 included 2 separate 3 x 3 and 1.2 cm lesions consistent with Paget's on the left.  Pathology revealed 2 foci of extramammary Paget's disease extending to the left inked margin in the right and anterior tip margins at multiple foci. The posterior margin was negative for malignancy.  She was seen in the office on November 04, 2012 for follow up and underwent a biopsy of the vulva.  Pathology resulted Paget's disease and she underwent a wide local excision of the left vulva x 2 with a left vulvar biopsy on 11/23/12 with Dr. Duard Moreno.  Final pathology revealed 1. Vulva, biopsy, left inferior - EXTRAMAMMARY PAGET DISEASE EXTENDING TO THE EDGES OF THE BIOPSY. 2. Vulva, excision, left superior lesion suture marks 1200 - EXTRAMAMMARY PAGET DISEASE. - MARGINS NOT INVOLVED. - CLOSEST MARGIN 9 O'CLOCK, LESS THAN 0.1 CM.  3. Vulva, excision, left inferior excision suture marks 1200  - EXTRAMAMMARY PAGET DISEASE, 12 O'CLOCK MARGIN FOCALLY INVOLVED.  - REMAINING MARGINS CLEAR.      Interval History:  She presents today for post-operative follow up.  She reports intermittent, mild vaginal bleeding.  She reports dysuria with no frequency or urgency since surgery.  Adequate PO intake reported.  Pain minimal.  She reports that she has only had one BM since surgery on November 23, 2012.  Passing flatus with no nausea or vomiting.  Reporting left lower extremity edema that developed after surgery.  Pain reported along the inner aspect of the left lower leg.  No other concerns  voiced.  Review of Systems  Constitutional: Feels well.  No fever.  Intermittent chills yesterday evening.  No change in appetite, early satiety, or change in weight.  Cardiovascular: No chest pain, shortness of breath, or edema.  Pulmonary: No cough or wheeze.  Gastrointestinal: Moderate constipation reported.  No nausea, vomiting, or diarrhea. No bright red blood per rectum.  Genitourinary: No frequency or urgency.  Positive for dysuria. No vaginal bleeding or discharge.  Musculoskeletal: No myalgia or joint pain.  Pain reported at inner aspect of left lower extremity. Neurologic: No weakness, numbness, or change in gait.  Walking with cane for assist.  Psychology: No depression, anxiety, or insomnia.  Current Meds:  Outpatient Encounter Prescriptions as of 12/02/2012  Medication Sig Dispense Refill  . acetaminophen (TYLENOL) 500 MG tablet Take 500 mg by mouth every 6 (six) hours as needed for pain.      . ALBUTEROL SULFATE HFA IN Inhale into the lungs. USE 2 PUFFS IF NEEDED FOR WHEEZING      . beta carotene w/minerals (OCUVITE) tablet Take 1 tablet by mouth daily.      Marland Kitchen buPROPion (WELLBUTRIN XL) 150 MG 24 hr tablet Take 150 mg by mouth 2 (two) times daily.        . Calcium Carbonate-Vitamin D 600-125 MG-UNIT TABS Take 2 tablets by mouth daily.      . citalopram (CELEXA) 40 MG tablet Take 40 mg by mouth daily.       Marland Kitchen docusate sodium (COLACE) 100 MG capsule Take 400  mg by mouth at bedtime as needed for constipation.       . Fluticasone-Salmeterol (ADVAIR) 250-50 MCG/DOSE AEPB Inhale 1 puff into the lungs every 12 (twelve) hours.      Marland Kitchen GREEN COFFEE BEAN PO Take 1 tablet by mouth daily.       Marland Kitchen HYDROcodone-acetaminophen (NORCO/VICODIN) 5-325 MG per tablet Take 2 tablets by mouth every 6 (six) hours as needed for pain.  30 tablet  0  . levothyroxine (SYNTHROID, LEVOTHROID) 175 MCG tablet Take 175 mcg by mouth every morning.       . Multiple Vitamins-Minerals (WOMENS 50+ MULTI VITAMIN/MIN  PO) Take 1 tablet by mouth daily.      Marland Kitchen nystatin-triamcinolone (MYCOLOG II) cream Apply 1 application topically 3 (three) times daily as needed (for rash).       . Omega-3 Fatty Acids (FISH OIL) 1200 MG CAPS Take 2 capsules by mouth daily.       Marland Kitchen OVER THE COUNTER MEDICATION Take 2 capsules by mouth 3 (three) times daily. Raspberry keytone 125 mg      . pantoprazole (PROTONIX) 40 MG tablet Take 40 mg by mouth 2 (two) times daily.       . Polyethylene Glycol 3350 (MIRALAX PO) Take 17 g by mouth 3 (three) times daily.       Marland Kitchen spironolactone (ALDACTONE) 50 MG tablet Take 25-50 mg by mouth daily as needed (for fluid).       No facility-administered encounter medications on file as of 12/02/2012.    Allergy:  Allergies  Allergen Reactions  . Demerol (Meperidine)   . Diazepam Other (See Comments)    Knocks me out   . Tape     "thick clear plastic tape" causes blisters  . Morphine And Related Itching and Rash    Per CRNA, pt tol Fentanyl IV for OR case, no issues reported to rec RN in PACU, no immediate PACU issues noted as well upon arrival  . Penicillins Rash    Social Hx:   History   Social History  . Marital Status: Married    Spouse Name: N/A    Number of Children: N/A  . Years of Education: N/A   Occupational History  . Not on file.   Social History Main Topics  . Smoking status: Former Smoker    Types: Cigarettes    Quit date: 06/05/1999  . Smokeless tobacco: Never Used  . Alcohol Use: No  . Drug Use: No  . Sexually Active: No   Other Topics Concern  . Not on file   Social History Narrative  . No narrative on file    Past Surgical Hx:  Past Surgical History  Procedure Laterality Date  . Mastectomy  1997    right breast  . Cholecystectomy    . Knee surgery      x5  . Joint replacement      right knee x2  . Joint replacement      left knee x5  . Back surgery    . Foot surgery      fused left foot  . Tonsillectomy  68  . Hand tendon surgery      right  hand tendon tear  . Rotator cuff repair      right rotator cuff  . Thyroidectomy    . Incisional hernia repair  05/05/2011    Procedure: LAPAROSCOPIC INCISIONAL HERNIA;  Surgeon: Mariella Saa, MD;  Location: [facility];  Service: General;  Laterality: N/A;  LAPAROSCOPIC REPAIR INCARCERATED INCISIONAL HERNIA  WITH MESH  . Hernia repair  05/05/11    emergency VH and incisional hernia repair   . Bladder suspension  10/13/2011    Procedure: TRANSVAGINAL TAPE (TVT) PROCEDURE;  Surgeon: Purcell Nails, MD;  Location: [facility];  Service: Gynecology;  Laterality: N/A;  . Cystoscopy  10/13/2011    Procedure: CYSTOSCOPY;  Surgeon: Purcell Nails, MD;  Location: [facility];  Service: Gynecology;  Laterality: Bilateral;  . Vulvectomy  05/27/2012    Procedure: WIDE EXCISION VULVECTOMY;  Surgeon: Rejeana Brock A. Vanessa Brady, MD;  Location: [facility];  Service: Gynecology;  Laterality: N/A;  Wide Local Excision of Vulva   . Breast surgery    . Breast lumpectomy      LEFT BREAST-CA  . Vulvectomy N/A 11/23/2012    Procedure: WIDE LOCAL EXCISION OF THE VULVA;  Surgeon: Rejeana Brock A. Vanessa Brady, MD;  Location: [facility];  Service: Gynecology;  Laterality: N/A;  left inferior vulvar biopsy excision left superior lesion left inferior vulvar excision    Past Medical Hx:  Past Medical History  Diagnosis Date  . Cancer   . Asthma   . Blood transfusion   . GERD (gastroesophageal reflux disease)   . Thyroid disease   . Allergy   . PONV (postoperative nausea and vomiting)   . Breast cancer, stage 2 05/28/2011  . 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 Hypothyroid 05/28/2011  . GERD (gastroesophageal reflux disease) 05/28/2011  . Fibromyalgia 05/28/2011  . 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 Mild depression 05/28/2011  . Thrombocytopenia, unspecified 05/28/2011  . Hx of migraines   . Right rib fracture 10/26/12    PT FELL AND CXR SHOWED RIGHT LATERAL 8th OR 9th RIB FRACTURE.   PT STATES SORENESS RESOLVED -  AND BREATHING GOOD    Family Hx:  Family History  Problem Relation Age of Onset  . Cancer Father 31    lung and kidney   . Alzheimer's disease Mother   . Alcohol abuse Brother   . Heart attack Maternal Grandmother     Vitals:  Blood pressure 118/62, pulse 100, temperature 98.2 F (36.8 C), resp. rate 18, height 5\' 5"  (1.651 m), weight 265 lb 14.4 oz (120.611 kg).  Physical Exam:  General: Well developed, well nourished female in no acute distress. Alert and oriented x 3.  Neck: Supple without any enlargements.  Lymph node survey: No cervical, supraclavicular, or inguinal adenopathy.  Cardiovascular: Regular rate and rhythm. S1 and S2 normal.  Lungs: Clear to auscultation bilaterally. No wheezes/crackles/rhonchi noted.  Skin: No rashes or lesions present. Back: No CVA tenderness.  Abdomen: Abdomen soft, non-tender and obese. Active bowel sounds in all quadrants. No evidence of a fluid wave or abdominal masses, limited due to habitus.  Genitourinary:    Vulva/vagina:  Left vulvar incision healing well with sutures intact.  No bleeding, drainage, or lesions.    Urethra: No lesions or masses.  Extremities: No bilateral cyanosis or clubbing.  1+pitting edema noted with LLE.   Assessment/Plan:  67 year old with Paget's disease of the vulva s/p wide local excision on the left vulva x2 with a left vulvar biopsy with Dr. Duard Moreno on 11/23/12.  Final pathology revealed 1. Vulva, biopsy, left inferior - EXTRAMAMMARY PAGET DISEASE EXTENDING TO THE EDGES OF THE BIOPSY. 2. Vulva, excision, left superior lesion suture marks 1200 - EXTRAMAMMARY PAGET DISEASE. - MARGINS NOT INVOLVED. - CLOSEST MARGIN 9 O'CLOCK, LESS THAN  0.1 CM.  3. Vulva, excision, left inferior excision suture marks 1200  - EXTRAMAMMARY PAGET DISEASE, 12 O'CLOCK MARGIN FOCALLY INVOLVED.  - REMAINING MARGINS CLEAR.  We will obtain a urine sample today for urinalysis and culture if needed.  We will arrange for a venous doppler of the LLE  to rule out DVT post-operatively.  She will be contacted with the results.  Reportable signs and symptoms reviewed.  She is to follow up with Dr. Duard Moreno on August 14.  Once she has healed, there are plans to schedule a wide local excision to excise the area of Paget's identified on the biopsy taken during surgery per Dr. Duard Moreno.      Kao Conry DEAL, NP 12/03/2012, 10:36 AM

## 2012-12-03 NOTE — Progress Notes (Signed)
*  PRELIMINARY RESULTS* Vascular Ultrasound Left lower extremity venous duplex has been completed.  Preliminary findings: negative for DVT and baker's cyst.    Farrel Demark, RDMS, RVT  12/03/2012, 10:08 AM

## 2012-12-03 NOTE — Telephone Encounter (Signed)
Message left with doppler results: negative for DVT.  Instructed to call for any questions or concerns.  Informed that she would be notified when urine culture results returned.

## 2012-12-04 LAB — URINE CULTURE

## 2012-12-06 ENCOUNTER — Telehealth: Payer: Self-pay | Admitting: Gynecologic Oncology

## 2012-12-06 NOTE — Telephone Encounter (Signed)
Patient notified of urine culture results.  Instructed to call for any needs or concerns.  Advised to follow up with her urologist sooner than August 5 if the symptoms persisted.  Verbalizing understanding.

## 2012-12-22 ENCOUNTER — Encounter: Payer: Self-pay | Admitting: Gynecologic Oncology

## 2012-12-22 ENCOUNTER — Other Ambulatory Visit: Payer: Self-pay | Admitting: Gynecologic Oncology

## 2012-12-22 ENCOUNTER — Ambulatory Visit: Payer: Medicare Other | Attending: Gynecologic Oncology | Admitting: Gynecologic Oncology

## 2012-12-22 VITALS — BP 128/72 | HR 84 | Temp 98.2°F | Resp 20 | Ht 65.0 in | Wt 269.0 lb

## 2012-12-22 DIAGNOSIS — Z79899 Other long term (current) drug therapy: Secondary | ICD-10-CM | POA: Insufficient documentation

## 2012-12-22 DIAGNOSIS — Z853 Personal history of malignant neoplasm of breast: Secondary | ICD-10-CM | POA: Insufficient documentation

## 2012-12-22 DIAGNOSIS — C4499 Other specified malignant neoplasm of skin, unspecified: Secondary | ICD-10-CM

## 2012-12-22 DIAGNOSIS — R609 Edema, unspecified: Secondary | ICD-10-CM

## 2012-12-22 DIAGNOSIS — E039 Hypothyroidism, unspecified: Secondary | ICD-10-CM | POA: Insufficient documentation

## 2012-12-22 DIAGNOSIS — K219 Gastro-esophageal reflux disease without esophagitis: Secondary | ICD-10-CM | POA: Insufficient documentation

## 2012-12-22 DIAGNOSIS — C519 Malignant neoplasm of vulva, unspecified: Secondary | ICD-10-CM

## 2012-12-22 MED ORDER — CLOTRIMAZOLE-BETAMETHASONE 1-0.05 % EX CREA: TOPICAL_CREAM | Freq: Two times a day (BID) | CUTANEOUS | Status: DC

## 2012-12-22 MED ORDER — SPIRONOLACTONE 50 MG PO TABS: 25.0000 mg | ORAL_TABLET | Freq: Every day | ORAL | Status: DC | PRN

## 2012-12-22 NOTE — Progress Notes (Signed)
Consult Note: Gyn-Onc  [name] 67 y.o. female  CC:  Chief Complaint  Patient presents with  . Paget's Disease of Vulva    Follow up    HPI: Patient is a 67 year old who was last seen by our service in April 2012. At that time she had a history of a partial simple vulvectomy in February of 2012 her vulvar Paget's disease. Some of the surgical margins were positive. Her postoperative course was uncomplicated. She was last seen by our service as stated above April 2012. At that time there was no evidence of any lesions consistent with Paget's. However, there is considerable amount of excoriations throughout the entire vulvar and medial thighs consistent with chronic vulvitis. Since we last saw her she had quite an eventful year in that she had a incarcerated hernia repaired in December of last year due to bowel obstruction by [name] with permanent mesh. In May of this year she also had a TVT bladder by Dr. Osborn Coho. She does continue to wear a pad and also takes Detrol twice daily but states that her urinary leakage is markedly improved. She describes the vulvar irritation is intermittent itching with burning and that it has increased over time as has the pruritus. She does use a steroid cream twice daily and does scratch her vulva.   Interval History:  She underwent a wide local excision of the left vulva on January 2 of year 2014. Operative findings included 2 separate 3 x 3 and 1.2 cm lesions consistent with Paget's on the left.  Pathology revealed 2 foci of extramammary Paget's disease extending to the left inked margin in the right and anterior tip margins at multiple foci. The posterior margin was negative for malignancy.   Last saw her for her postoperative check in February. She comes in is complaining of "bleeding" and scratching her left vulva.   I performed a biopsy of this it was consistent with recurrent Paget's disease. On 11/23/2012 showed a repeat wide local  excision of liver x2 with the posterior fourchette biopsy. Findings revealed along the left labia minora/vagina is a Paget's disease measuring approximately 2.5 x 1 cm in the superior aspect of her prior left vulvar excision. The inferior aspect of a similar lesion. 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 On the left perineal body the area is somewhat excoriated pale. Biopsy was performed in this area.  Pathology revealed:  1. Vulva, biopsy, left inferior - EXTRAMAMMARY PAGET DISEASE EXTENDING TO THE EDGES OF THE BIOPSY. 2. Vulva, excision, left superior lesion suture marks 1200 - EXTRAMAMMARY PAGET DISEASE. - MARGINS NOT INVOLVED. - CLOSEST MARGIN 9 O'CLOCK, LESS THAN 0.1 CM. 3. Vulva, excision, left inferior excision suture marks 1200 - EXTRAMAMMARY PAGET DISEASE, 12 O'CLOCK MARGIN FOCALLY INVOLVED. - REMAINING MARGINS CLEAR.  She comes in today for her postoperative check. She's been seen by Efraim Kaufmann across our nurse practitioner and the wound has been healing well. She asked to be seen earlier by she can be having fairly extensive orthopedic surgery on her left ankle and wanted to make sure it was okay for her to proceed with this in the setting of the need to repeat wide local excision of the posterior fourchette for the area that was positive   Current Meds:  Outpatient Encounter Prescriptions as of 12/22/2012  Medication Sig Dispense Refill  . acetaminophen (TYLENOL) 500 MG tablet Take 500 mg by mouth every 6 (six) hours as needed for pain.      Marland Kitchen  ALBUTEROL SULFATE HFA IN Inhale into the lungs. USE 2 PUFFS IF NEEDED FOR WHEEZING      . beta carotene w/minerals (OCUVITE) tablet Take 1 tablet by mouth daily.      Marland Kitchen buPROPion (WELLBUTRIN XL) 150 MG 24 hr tablet Take 150 mg by mouth 2 (two) times daily.        . Calcium Carbonate-Vitamin D 600-125 MG-UNIT TABS Take 2 tablets by mouth daily.      . citalopram (CELEXA) 40 MG tablet Take 40 mg by mouth daily.        Marland Kitchen docusate sodium (COLACE) 100 MG capsule Take 400 mg by mouth at bedtime as needed for constipation.       . Fluticasone-Salmeterol (ADVAIR) 250-50 MCG/DOSE AEPB Inhale 1 puff into the lungs every 12 (twelve) hours.      Marland Kitchen GREEN COFFEE BEAN PO Take 1 tablet by mouth daily.       Marland Kitchen levothyroxine (SYNTHROID, LEVOTHROID) 175 MCG tablet Take 175 mcg by mouth every morning.       . Multiple Vitamins-Minerals (WOMENS 50+ MULTI VITAMIN/MIN PO) Take 1 tablet by mouth daily.      Marland Kitchen nystatin-triamcinolone (MYCOLOG II) cream Apply 1 application topically 3 (three) times daily as needed (for rash).       . Omega-3 Fatty Acids (FISH OIL) 1200 MG CAPS Take 2 capsules by mouth daily.       Marland Kitchen OVER THE COUNTER MEDICATION Take 2 capsules by mouth 3 (three) times daily. Raspberry keytone 125 mg      . pantoprazole (PROTONIX) 40 MG tablet Take 40 mg by mouth 2 (two) times daily.       . Polyethylene Glycol 3350 (MIRALAX PO) Take 17 g by mouth 3 (three) times daily.       Marland Kitchen spironolactone (ALDACTONE) 50 MG tablet Take 25-50 mg by mouth daily as needed (for fluid).      Marland Kitchen HYDROcodone-acetaminophen (NORCO/VICODIN) 5-325 MG per tablet Take 2 tablets by mouth every 6 (six) hours as needed for pain.  30 tablet  0   No facility-administered encounter medications on file as of 12/22/2012.    Allergy:  Allergies  Allergen Reactions  . Demerol (Meperidine)   . Diazepam Other (See Comments)    Knocks me out   . Tape     "thick clear plastic tape" causes blisters  . Morphine And Related Itching and Rash    Per CRNA, pt tol Fentanyl IV for OR case, no issues reported to rec RN in PACU, no immediate PACU issues noted as well upon arrival  . Penicillins Rash    Social Hx:   History   Social History  . Marital Status: Married    Spouse Name: N/A    Number of Children: N/A  . Years of Education: N/A   Occupational History  . Not on file.   Social History Main Topics  . Smoking status: Former Smoker     Types: Cigarettes    Quit date: 06/05/1999  . Smokeless tobacco: Never Used  . Alcohol Use: No  . Drug Use: No  . Sexually Active: No   Other Topics Concern  . Not on file   Social History Narrative  . No narrative on file    Past Surgical Hx:  Past Surgical History  Procedure Laterality Date  . Mastectomy  1997    right breast  . Cholecystectomy    . Knee surgery      x5  . Joint  replacement      right knee x2  . Joint replacement      left knee x5  . Back surgery    . Foot surgery      fused left foot  . Tonsillectomy  68  . Hand tendon surgery      right hand tendon tear  . Rotator cuff repair      right rotator cuff  . Thyroidectomy    . Incisional hernia repair  05/05/2011    Procedure: LAPAROSCOPIC INCISIONAL HERNIA;  Surgeon: Mariella Saa, MD;  Location: [facility];  Service: General;  Laterality: N/A;  LAPAROSCOPIC REPAIR INCARCERATED INCISIONAL HERNIA  WITH MESH  . Hernia repair  05/05/11    emergency VH and incisional hernia repair   . Bladder suspension  10/13/2011    Procedure: TRANSVAGINAL TAPE (TVT) PROCEDURE;  Surgeon: Purcell Nails, MD;  Location: [facility];  Service: Gynecology;  Laterality: N/A;  . Cystoscopy  10/13/2011    Procedure: CYSTOSCOPY;  Surgeon: Purcell Nails, MD;  Location: [facility];  Service: Gynecology;  Laterality: Bilateral;  . Vulvectomy  05/27/2012    Procedure: WIDE EXCISION VULVECTOMY;  Surgeon: Rejeana Brock A. Duard Brady, MD;  Location: [facility];  Service: Gynecology;  Laterality: N/A;  Wide Local Excision of Vulva   . Breast surgery    . Breast lumpectomy      LEFT BREAST-CA  . Vulvectomy N/A 11/23/2012    Procedure: WIDE LOCAL EXCISION OF THE VULVA;  Surgeon: Rejeana Brock A. Duard Brady, MD;  Location: [facility];  Service: Gynecology;  Laterality: N/A;  left inferior vulvar biopsy excision left superior lesion left inferior vulvar excision    Past Medical Hx:  Past Medical History  Diagnosis Date  . Cancer   . Asthma   . Blood transfusion   .  GERD (gastroesophageal reflux disease)   . Thyroid disease   . Allergy   . PONV (postoperative nausea and vomiting)   . Breast cancer, stage 2 05/28/2011  . 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 Hypothyroid 05/28/2011  . GERD (gastroesophageal reflux disease) 05/28/2011  . Fibromyalgia 05/28/2011  . 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 Mild depression 05/28/2011  . Thrombocytopenia, unspecified 05/28/2011  . Hx of migraines   . Right rib fracture 10/26/12    PT FELL AND CXR SHOWED RIGHT LATERAL 8th OR 9th RIB FRACTURE.   PT STATES SORENESS RESOLVED - AND BREATHING GOOD    Family Hx:  Family History  Problem Relation Age of Onset  . Cancer Father 18    lung and kidney   . Alzheimer's disease Mother   . Alcohol abuse Brother   . Heart attack Maternal Grandmother     Vitals:  Blood pressure 128/72, pulse 84, temperature 98.2 F (36.8 C), temperature source Oral, resp. rate 20, height 5\' 5"  (1.651 m), weight 269 lb (122.018 kg).  Physical Exam: Well-nourished well-developed female in no acute distress.  Pelvic: External genitalia status post excision of the left labia. The area is well-healed. The inferior left area is just consistent with a pale the biopsy site that was positive for Paget's disease.  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 She will contact us after she has her orthopedic surgery we'll schedule her for repeat blood local excision of her Paget's disease. She also has 2 areas one under her pannus on the right and 1 under the right breast consistent with Candida. She was given a prescription for  Lotrisone.   Cleda Mccreedy A., MD 12/22/2012, 4:23 PM

## 2012-12-24 ENCOUNTER — Telehealth: Payer: Self-pay | Admitting: *Deleted

## 2012-12-24 ENCOUNTER — Other Ambulatory Visit: Payer: Self-pay | Admitting: *Deleted

## 2012-12-24 NOTE — Telephone Encounter (Signed)
Received vm call from pt this am stating that she had some labs done at Dr Yehuda Budd & her platelets were 130 K & would like someone to call her to discuss.  Call returned & she states that she has to have some foot surgery at [facility].  Informed that platelet count within her normal range & should be OK for surgery.  She also states that she thought she should

## 2013-01-06 ENCOUNTER — Ambulatory Visit: Payer: Medicare Other | Admitting: Gynecologic Oncology

## 2013-03-26 HISTORY — PX: FOOT SURGERY: SHX648

## 2013-05-25 ENCOUNTER — Encounter: Payer: Self-pay | Admitting: Gynecologic Oncology

## 2013-05-25 ENCOUNTER — Encounter (INDEPENDENT_AMBULATORY_CARE_PROVIDER_SITE_OTHER): Payer: Self-pay

## 2013-05-25 ENCOUNTER — Ambulatory Visit: Payer: Medicare Other | Attending: Gynecologic Oncology | Admitting: Gynecologic Oncology

## 2013-05-25 ENCOUNTER — Ambulatory Visit: Payer: Medicare Other

## 2013-05-25 VITALS — BP 135/87 | HR 83 | Temp 98.0°F | Resp 16

## 2013-05-25 DIAGNOSIS — C4499 Other specified malignant neoplasm of skin, unspecified: Secondary | ICD-10-CM

## 2013-05-25 DIAGNOSIS — R8290 Unspecified abnormal findings in urine: Secondary | ICD-10-CM

## 2013-05-25 DIAGNOSIS — R300 Dysuria: Secondary | ICD-10-CM

## 2013-05-25 LAB — URINALYSIS, MICROSCOPIC - CHCC
Bilirubin (Urine): NEGATIVE
Blood: NEGATIVE
Glucose: NEGATIVE mg/dL
RBC / HPF: NEGATIVE (ref 0–2)
Specific Gravity, Urine: 1.005 (ref 1.003–1.035)
Urobilinogen, UR: 0.2 mg/dL (ref 0.2–1)

## 2013-05-25 MED ORDER — SULFAMETHOXAZOLE-TMP DS 800-160 MG PO TABS: 1.0000 | ORAL_TABLET | Freq: Two times a day (BID) | ORAL | Status: DC

## 2013-05-25 NOTE — Progress Notes (Signed)
Follow Up Note: Gyn-Onc  [name] 67 y.o. female  CC:  Chief Complaint  Patient presents with  . Foul smelling urine, Paget's disease    Follow up     HPI:  [name] is a 67 year old female who has a history of having a partial simple vulvectomy in February of 2012 along with a wide local excision of the left vulva on May 27, 2012 for Paget's disease. Operative findings for 05/27/12 included 2 separate 3 x 3 and 1.2 cm lesions consistent with Paget's on the left.  Pathology revealed 2 foci of extramammary Paget's disease extending to the left inked margin in the right and anterior tip margins at multiple foci. The posterior margin was negative for malignancy.  She was seen in the office on November 04, 2012 for follow up and underwent a biopsy of the vulva.  Pathology resulted Paget's disease and she underwent a wide local excision of the left vulva x 2 with a left vulvar biopsy on 11/23/12 with Dr. Duard Brady.  Final pathology revealed 1. Vulva, biopsy, left inferior - EXTRAMAMMARY PAGET DISEASE EXTENDING TO THE EDGES OF THE BIOPSY. 2. Vulva, excision, left superior lesion suture marks 1200 - EXTRAMAMMARY PAGET DISEASE. - MARGINS NOT INVOLVED. - CLOSEST MARGIN 9 O'CLOCK, LESS THAN 0.1 CM.  3. Vulva, excision, left inferior excision suture marks 1200  - EXTRAMAMMARY PAGET DISEASE, 12 O'CLOCK MARGIN FOCALLY INVOLVED.  - REMAINING MARGINS CLEAR.      Interval History:  She presents today for follow up and with complaints of pain with urination.  She reports intermittent, suprapubic pain that becomes sharp with urination.  She reports foul smelling urine with minimal amounts when voiding but no frequency or urgency.  Adequate PO intake reported.  She continues to have issues with constipation.  Passing flatus with no nausea or vomiting.  She underwent surgery in August 2014 and states that if she had to make the choice again, she would not proceed.  She ended up having a wound infection in her left  foot that required a wound vac that she got off last week.  She has not been able to drive since August 1610.  She reports improvement with a follow up appointment in Jan. 2015.  She is also to see Dr. Sherron Monday in Jan 2015.  Denies vaginal bleeding but reports intermittent itching to the left vulva.  No other concerns voiced.   Review of Systems  Constitutional: Feels well.  No fever.  Intermittent chills yesterday evening.  No change in appetite, early satiety, or change in weight.  Cardiovascular: No chest pain, shortness of breath, or edema.  Pulmonary: No cough or wheeze.  Gastrointestinal: Moderate constipation reported.  No nausea, vomiting, or diarrhea. No bright red blood per rectum.  Genitourinary: No frequency or urgency.  Positive for dysuria. No vaginal bleeding or discharge.  Musculoskeletal: No myalgia or joint pain.   Neurologic: No weakness or numbness. She arrived in a wheelchair.  She has a boot to the left LE. Psychology: No depression, anxiety, or insomnia.  Current Meds:  Outpatient Encounter Prescriptions as of 05/25/2013  Medication Sig  . acetaminophen (TYLENOL) 500 MG tablet Take 500 mg by mouth every 6 (six) hours as needed for pain.  . ALBUTEROL SULFATE HFA IN Inhale into the lungs. USE 2 PUFFS IF NEEDED FOR WHEEZING  . beta carotene w/minerals (OCUVITE) tablet Take 1 tablet by mouth daily.  Marland Kitchen buPROPion (WELLBUTRIN XL) 150 MG 24 hr tablet Take 150  mg by mouth 2 (two) times daily.    . Calcium Carbonate-Vitamin D 600-125 MG-UNIT TABS Take 2 tablets by mouth daily.  . citalopram (CELEXA) 40 MG tablet Take 40 mg by mouth daily.   . clotrimazole-betamethasone (LOTRISONE) cream Apply topically 2 (two) times daily.  Marland Kitchen docusate sodium (COLACE) 100 MG capsule Take 400 mg by mouth at bedtime as needed for constipation.   . Fluticasone-Salmeterol (ADVAIR) 250-50 MCG/DOSE AEPB Inhale 1 puff into the lungs every 12 (twelve) hours.  Marland Kitchen GREEN COFFEE BEAN PO Take 1 tablet by  mouth daily.   Marland Kitchen levothyroxine (SYNTHROID, LEVOTHROID) 175 MCG tablet Take 175 mcg by mouth every morning.   . Multiple Vitamins-Minerals (WOMENS 50+ MULTI VITAMIN/MIN PO) Take 1 tablet by mouth daily.  Marland Kitchen nystatin-triamcinolone (MYCOLOG II) cream Apply 1 application topically 3 (three) times daily as needed (for rash).   . Omega-3 Fatty Acids (FISH OIL) 1200 MG CAPS Take 2 capsules by mouth daily.   Marland Kitchen OVER THE COUNTER MEDICATION Take 2 capsules by mouth 3 (three) times daily. Raspberry keytone 125 mg  . pantoprazole (PROTONIX) 40 MG tablet Take 40 mg by mouth 2 (two) times daily.   . Polyethylene Glycol 3350 (MIRALAX PO) Take 17 g by mouth 3 (three) times daily.   Marland Kitchen spironolactone (ALDACTONE) 50 MG tablet Take 0.5-1 tablets (25-50 mg total) by mouth daily as needed (for fluid).  Marland Kitchen HYDROcodone-acetaminophen (NORCO/VICODIN) 5-325 MG per tablet Take 2 tablets by mouth every 6 (six) hours as needed for pain.    Allergy:  Allergies  Allergen Reactions  . Demerol [Meperidine]   . Diazepam Other (See Comments)    Knocks me out   . Tape     "thick clear plastic tape" causes blisters  . Morphine And Related Itching and Rash    Per CRNA, pt tol Fentanyl IV for OR case, no issues reported to rec RN in PACU, no immediate PACU issues noted as well upon arrival  . Penicillins Rash    Social Hx:   History   Social History  . Marital Status: Married    Spouse Name: N/A    Number of Children: N/A  . Years of Education: N/A   Occupational History  . Not on file.   Social History Main Topics  . Smoking status: Former Smoker    Types: Cigarettes    Quit date: 06/05/1999  . Smokeless tobacco: Never Used  . Alcohol Use: No  . Drug Use: No  . Sexual Activity: No   Other Topics Concern  . Not on file   Social History Narrative  . No narrative on file    Past Surgical Hx:  Past Surgical History  Procedure Laterality Date  . Mastectomy  1997    right breast  . Cholecystectomy    .  Knee surgery      x5  . Joint replacement      right knee x2  . Joint replacement      left knee x5  . Back surgery    . Foot surgery      fused left foot  . Tonsillectomy  68  . Hand tendon surgery      right hand tendon tear  . Rotator cuff repair      right rotator cuff  . Thyroidectomy    . Incisional hernia repair  05/05/2011    Procedure: LAPAROSCOPIC INCISIONAL HERNIA;  Surgeon: Mariella Saa, MD;  Location: [facility];  Service: General;  Laterality: N/A;  LAPAROSCOPIC REPAIR INCARCERATED INCISIONAL HERNIA  WITH MESH  . Hernia repair  05/05/11    emergency VH and incisional hernia repair   . Bladder suspension  10/13/2011    Procedure: TRANSVAGINAL TAPE (TVT) PROCEDURE;  Surgeon: Purcell Nails, MD;  Location: [facility];  Service: Gynecology;  Laterality: N/A;  . Cystoscopy  10/13/2011    Procedure: CYSTOSCOPY;  Surgeon: Purcell Nails, MD;  Location: [facility];  Service: Gynecology;  Laterality: Bilateral;  . Vulvectomy  05/27/2012    Procedure: WIDE EXCISION VULVECTOMY;  Surgeon: Rejeana Brock A. Duard Brady, MD;  Location: [facility];  Service: Gynecology;  Laterality: N/A;  Wide Local Excision of Vulva   . Breast surgery    . Breast lumpectomy      LEFT BREAST-CA  . Vulvectomy N/A 11/23/2012    Procedure: WIDE LOCAL EXCISION OF THE VULVA;  Surgeon: Rejeana Brock A. Duard Brady, MD;  Location: [facility];  Service: Gynecology;  Laterality: N/A;  left inferior vulvar biopsy excision left superior lesion left inferior vulvar excision    Past Medical Hx:  Past Medical History  Diagnosis Date  . Cancer   . Asthma   . Blood transfusion   . GERD (gastroesophageal reflux disease)   . Thyroid disease   . Allergy   . PONV (postoperative nausea and vomiting)   . Breast cancer, stage 2 05/28/2011  . 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 Hypothyroid 05/28/2011  . GERD (gastroesophageal reflux disease) 05/28/2011  . Fibromyalgia 05/28/2011  . 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 Mild depression 05/28/2011  . Thrombocytopenia, unspecified 05/28/2011  . Hx of migraines   . Right rib fracture 10/26/12    PT FELL AND CXR SHOWED RIGHT LATERAL 8th OR 9th RIB FRACTURE.   PT STATES SORENESS RESOLVED - AND BREATHING GOOD    Family Hx:  Family History  Problem Relation Age of Onset  . Cancer Father 56    lung and kidney   . Alzheimer's disease Mother   . Alcohol abuse Brother   . Heart attack Maternal Grandmother     Vitals:  Blood pressure 135/87, pulse 83, temperature 98 F (36.7 C), temperature source Oral, resp. rate 16.  Physical Exam:  General: Well developed, well nourished female in no acute distress. Alert and oriented x 3.  Cardiovascular: Regular rate and rhythm. S1 and S2 normal.  Lungs: Clear to auscultation bilaterally. No wheezes/crackles/rhonchi noted.  Skin: No rashes or lesions present. Back: No CVA tenderness.  Abdomen: Abdomen soft, non-tender and obese. Active bowel sounds in all quadrants. No evidence of a fluid wave or abdominal masses, limited due to habitus.  Genitourinary:    Vulva:  External genitalia status post excision of the left labia. The area is well-healed. The inferior left       area is just consistent with a pale the biopsy site that was positive for Paget's disease.  Left foot with ace bandage.    Assessment/Plan:  67 year old with recurrent Paget's disease.  We will obtain a urine sample today for urinalysis and culture if needed.  She will be contacted with the results.  Reportable signs and symptoms reviewed.  She is to follow up with Dr. Duard Brady on Jan. 21 with plans for a wide local excision of the vulva in Feb. 2015.  She is advised to call for any questions or concerns.  Ranbir Chew DEAL, NP 05/25/2013, 3:26 PM

## 2013-05-25 NOTE — Patient Instructions (Signed)
We will contact you with the results of your urine sample from today.  Plan to follow up with Dr. Duard Brady on Jan 21 with plans for outpatient surgery in Feb.

## 2013-05-27 ENCOUNTER — Telehealth: Payer: Self-pay | Admitting: Gynecologic Oncology

## 2013-05-27 NOTE — Telephone Encounter (Signed)
Patient reporting improvement in urinary symptoms after taking Bactrim.  Reporting vulvar itching but has cream to use to assist with symptoms.  Urine results unavailable.  Reportable signs and symptoms reviewed.  Will contact her when culture results available.

## 2013-05-28 LAB — URINE CULTURE

## 2013-05-30 ENCOUNTER — Telehealth: Payer: Self-pay | Admitting: Gynecologic Oncology

## 2013-05-30 NOTE — Telephone Encounter (Signed)
Pt informed of urine culture results.  Reporting improvement in symptoms.  Advised to call if symptoms develop again.  No concerns voiced.

## 2013-06-15 ENCOUNTER — Ambulatory Visit: Payer: Medicare Other | Attending: Gynecologic Oncology | Admitting: Gynecologic Oncology

## 2013-06-15 ENCOUNTER — Ambulatory Visit: Payer: Medicare Other

## 2013-06-15 ENCOUNTER — Encounter: Payer: Self-pay | Admitting: Gynecologic Oncology

## 2013-06-15 VITALS — BP 138/80 | HR 75 | Temp 98.1°F | Resp 16 | Ht 65.0 in | Wt 256.5 lb

## 2013-06-15 DIAGNOSIS — M5137 Other intervertebral disc degeneration, lumbosacral region: Secondary | ICD-10-CM | POA: Insufficient documentation

## 2013-06-15 DIAGNOSIS — D071 Carcinoma in situ of vulva: Principal | ICD-10-CM

## 2013-06-15 DIAGNOSIS — F3289 Other specified depressive episodes: Secondary | ICD-10-CM | POA: Insufficient documentation

## 2013-06-15 DIAGNOSIS — Z853 Personal history of malignant neoplasm of breast: Secondary | ICD-10-CM | POA: Insufficient documentation

## 2013-06-15 DIAGNOSIS — IMO0001 Reserved for inherently not codable concepts without codable children: Secondary | ICD-10-CM

## 2013-06-15 DIAGNOSIS — Z9010 Acquired absence of unspecified breast and nipple: Secondary | ICD-10-CM | POA: Insufficient documentation

## 2013-06-15 DIAGNOSIS — J45909 Unspecified asthma, uncomplicated: Secondary | ICD-10-CM | POA: Insufficient documentation

## 2013-06-15 DIAGNOSIS — C519 Malignant neoplasm of vulva, unspecified: Secondary | ICD-10-CM | POA: Insufficient documentation

## 2013-06-15 DIAGNOSIS — M51379 Other intervertebral disc degeneration, lumbosacral region without mention of lumbar back pain or lower extremity pain: Secondary | ICD-10-CM | POA: Insufficient documentation

## 2013-06-15 DIAGNOSIS — F329 Major depressive disorder, single episode, unspecified: Secondary | ICD-10-CM | POA: Insufficient documentation

## 2013-06-15 DIAGNOSIS — Z96659 Presence of unspecified artificial knee joint: Secondary | ICD-10-CM | POA: Insufficient documentation

## 2013-06-15 DIAGNOSIS — Z87891 Personal history of nicotine dependence: Secondary | ICD-10-CM | POA: Insufficient documentation

## 2013-06-15 DIAGNOSIS — E039 Hypothyroidism, unspecified: Secondary | ICD-10-CM | POA: Insufficient documentation

## 2013-06-15 DIAGNOSIS — Z79899 Other long term (current) drug therapy: Secondary | ICD-10-CM | POA: Insufficient documentation

## 2013-06-15 DIAGNOSIS — L293 Anogenital pruritus, unspecified: Secondary | ICD-10-CM | POA: Insufficient documentation

## 2013-06-15 DIAGNOSIS — K219 Gastro-esophageal reflux disease without esophagitis: Secondary | ICD-10-CM | POA: Insufficient documentation

## 2013-06-15 LAB — URINALYSIS, MICROSCOPIC - CHCC
BILIRUBIN (URINE): NEGATIVE
Blood: NEGATIVE
Glucose: NEGATIVE mg/dL
KETONES: 5 mg/dL
Nitrite: NEGATIVE
PH: 6.5 (ref 4.6–8.0)
Protein: NEGATIVE mg/dL
RBC / HPF: NEGATIVE (ref 0–2)
Specific Gravity, Urine: 1.01 (ref 1.003–1.035)
Urobilinogen, UR: 0.2 mg/dL (ref 0.2–1)

## 2013-06-15 NOTE — Progress Notes (Signed)
Consult Note: Gyn-Onc  [name] 68 y.o. female  CC:  Chief Complaint  Patient presents with  . 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 Some of the surgical margins were positive. Her postoperative course was uncomplicated. She was last seen by our service in 12/14. At that time there was no evidence of any lesions consistent with Paget's. However, there is considerable amount of excoriations throughout the entire vulvar and medial thighs consistent with chronic vulvitis.In December 2013 she had a hernia repair due to bowel obstruction by Dr. Excell Seltzer with permanent mesh. In May 2013 she also had a TVT bladder by Dr. Everett Graff. She does continue to wear a pad and also takes Detrol twice daily but states that her urinary leakage is markedly improved. She described the vulvar irritation is intermittent itching with burning and that it has increased over time as has the pruritus. She does use a steroid cream twice daily and does scratch her vulva.   She underwent a wide local excision of the left vulva on January 2 of year 2014. Operative findings included 2 separate 3 x 3 and 1.2 cm lesions consistent with Paget's on the left.  Pathology revealed 2 foci of extramammary Paget's disease extending to the left inked margin in the right and anterior tip margins at multiple foci. The posterior margin was negative for malignancy.   I saw her for her postoperative check in February 2014. She comes in is complaining of "bleeding" and scratching her left vulva.   I performed a biopsy of this it was consistent with recurrent Paget's disease. On 11/23/2012 showed a repeat wide local excision of liver x2 with the posterior fourchette biopsy. Findings revealed along the left labia minora/vagina is a Paget's disease measuring approximately 2.5 x 1 cm in the superior aspect of  her prior left vulvar excision. The inferior aspect of a similar lesion. 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 On the left perineal body the area is somewhat excoriated pale. Biopsy was performed in this area.  Pathology revealed:  1. Vulva, biopsy, left inferior - EXTRAMAMMARY PAGET DISEASE EXTENDING TO THE EDGES OF THE BIOPSY. 2. Vulva, excision, left superior lesion suture marks 1200 - EXTRAMAMMARY PAGET DISEASE. - MARGINS NOT INVOLVED. - CLOSEST MARGIN 9 O'CLOCK, LESS THAN 0.1 CM. 3. Vulva, excision, left inferior excision suture marks 1200 - EXTRAMAMMARY PAGET DISEASE, 12 O'CLOCK MARGIN FOCALLY INVOLVED. - REMAINING MARGINS CLEAR.  We have discussed repeating surgery however she had multiple orthopedic issues had broken ankle. She was last seen by [name] in December 2014. Exam at that time was fairly unremarkable. She comes in today for a repeat exam and discussion of wide local excision versus continued followup.  She is doing fairly well from her left ankle surgery. She's currently able to bear weight on it. She has not yet started her physical therapy. She is complaining of pruritus and she admits to scratching. She is using her steroid cream twice a day. She's had some pink discharge for 2 days last week is likely secondary broken skin from scratching. In December she'll urinalysis that was negative. She continues to complain of pain at initiation of her weight. She is seeing Dr. Chauncey Cruel in urology tomorrow for evaluation.   Current Meds:  Outpatient Encounter Prescriptions as of 06/15/2013  Medication  Sig  . acetaminophen (TYLENOL) 500 MG tablet Take 500 mg by mouth every 6 (six) hours as needed for pain.  . ALBUTEROL SULFATE HFA IN Inhale into the lungs. USE 2 PUFFS IF NEEDED FOR WHEEZING  . beta carotene w/minerals (OCUVITE) tablet Take 1 tablet by mouth daily.  Marland Kitchen buPROPion (WELLBUTRIN XL) 150 MG 24 hr tablet Take 150 mg by mouth 2 (two)  times daily.    . Calcium Carbonate-Vitamin D 600-125 MG-UNIT TABS Take 2 tablets by mouth daily.  . citalopram (CELEXA) 40 MG tablet Take 40 mg by mouth daily.   . clotrimazole-betamethasone (LOTRISONE) cream Apply topically 2 (two) times daily.  Marland Kitchen docusate sodium (COLACE) 100 MG capsule Take 400 mg by mouth at bedtime as needed for constipation.   . Fluticasone-Salmeterol (ADVAIR) 250-50 MCG/DOSE AEPB Inhale 1 puff into the lungs every 12 (twelve) hours.  Marland Kitchen GREEN COFFEE BEAN PO Take 1 tablet by mouth daily.   Marland Kitchen HYDROcodone-acetaminophen (NORCO/VICODIN) 5-325 MG per tablet Take 2 tablets by mouth every 6 (six) hours as needed for pain.  Marland Kitchen levothyroxine (SYNTHROID, LEVOTHROID) 175 MCG tablet Take 175 mcg by mouth every morning.   . Multiple Vitamins-Minerals (WOMENS 50+ MULTI VITAMIN/MIN PO) Take 1 tablet by mouth daily.  Marland Kitchen nystatin-triamcinolone (MYCOLOG II) cream Apply 1 application topically 3 (three) times daily as needed (for rash).   . Omega-3 Fatty Acids (FISH OIL) 1200 MG CAPS Take 2 capsules by mouth daily.   Marland Kitchen OVER THE COUNTER MEDICATION Take 2 capsules by mouth 3 (three) times daily. Raspberry keytone 125 mg  . pantoprazole (PROTONIX) 40 MG tablet Take 40 mg by mouth 2 (two) times daily.   Marland Kitchen spironolactone (ALDACTONE) 50 MG tablet Take 0.5-1 tablets (25-50 mg total) by mouth daily as needed (for fluid).  Marland Kitchen sulfamethoxazole-trimethoprim (BACTRIM DS) 800-160 MG per tablet Take 1 tablet by mouth 2 (two) times daily.  . clindamycin (CLEOCIN) 300 MG capsule Only when going to the dentist, due to hardware in left leg  . oxyCODONE (OXY IR/ROXICODONE) 5 MG immediate release tablet   . Polyethylene Glycol 3350 (MIRALAX PO) Take 17 g by mouth 3 (three) times daily.     Allergy:  Allergies  Allergen Reactions  . Demerol [Meperidine]   . Diazepam Other (See Comments)    Knocks me out   . Tape     "thick clear plastic tape" causes blisters  . Morphine And Related Itching and Rash    Per  [facility], pt tol Fentanyl IV for OR case, no issues reported to rec RN in PACU, no immediate PACU issues noted as well upon arrival  . Penicillins Rash    Social Hx:   History   Social History  . Marital Status: Married    Spouse Name: N/A    Number of Children: N/A  . Years of Education: N/A   Occupational History  . Not on file.   Social History Main Topics  . Smoking status: Former Smoker    Types: Cigarettes    Quit date: 06/05/1999  . Smokeless tobacco: Never Used  . Alcohol Use: No  . Drug Use: No  . Sexual Activity: No   Other Topics Concern  . Not on file   Social History Narrative  . No narrative on file    Past Surgical Hx:  Past Surgical History  Procedure Laterality Date  . Mastectomy  1997    right breast  . Cholecystectomy    . Knee surgery  x5  . Joint replacement      right knee x2  . Joint replacement      left knee x5  . Back surgery    . Foot surgery      fused left foot  . Tonsillectomy  68  . Hand tendon surgery      right hand tendon tear  . Rotator cuff repair      right rotator cuff  . Thyroidectomy    . Incisional hernia repair  05/05/2011    Procedure: LAPAROSCOPIC INCISIONAL HERNIA;  Surgeon: Edward Jolly, MD;  Location: [facility];  Service: General;  Laterality: N/A;  LAPAROSCOPIC REPAIR INCARCERATED INCISIONAL HERNIA  WITH MESH  . Hernia repair  05/05/11    emergency VH and incisional hernia repair   . Bladder suspension  10/13/2011    Procedure: TRANSVAGINAL TAPE (TVT) PROCEDURE;  Surgeon: Delice Lesch, MD;  Location: [facility];  Service: Gynecology;  Laterality: N/A;  . Cystoscopy  10/13/2011    Procedure: CYSTOSCOPY;  Surgeon: Delice Lesch, MD;  Location: [facility];  Service: Gynecology;  Laterality: Bilateral;  . Vulvectomy  05/27/2012    Procedure: WIDE EXCISION VULVECTOMY;  Surgeon: Imagene Gurney A. Alycia Rossetti, MD;  Location: [facility];  Service: Gynecology;  Laterality: N/A;  Wide Local Excision of Vulva   . Breast surgery    .  Breast lumpectomy      LEFT BREAST-CA  . Vulvectomy N/A 11/23/2012    Procedure: WIDE LOCAL EXCISION OF THE VULVA;  Surgeon: Imagene Gurney A. Alycia Rossetti, MD;  Location: [facility];  Service: Gynecology;  Laterality: N/A;  left inferior vulvar biopsy excision left superior lesion left inferior vulvar excision    Past Medical Hx:  Past Medical History  Diagnosis Date  . Cancer   . Asthma   . Blood transfusion   . GERD (gastroesophageal reflux disease)   . Thyroid disease   . Allergy   . PONV (postoperative nausea and vomiting)   . Breast cancer, stage 2 05/28/2011  . 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 Hypothyroid 05/28/2011  . GERD (gastroesophageal reflux disease) 05/28/2011  . Fibromyalgia 05/28/2011  . 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 Mild depression 05/28/2011  . Thrombocytopenia, unspecified 05/28/2011  . Hx of migraines   . Right rib fracture 10/26/12    PT FELL AND CXR SHOWED RIGHT LATERAL 8th OR 9th RIB FRACTURE.   PT STATES SORENESS RESOLVED - AND BREATHING GOOD    Family Hx:  Family History  Problem Relation Age of Onset  . Cancer Father 38    lung and kidney   . Alzheimer's disease Mother   . Alcohol abuse Brother   . Heart attack Maternal Grandmother     Vitals:  Blood pressure 138/80, pulse 75, temperature 98.1 F (36.7 C), temperature source Oral, resp. rate 16, height 5\' 5"  (1.651 m), weight 256 lb 8 oz (116.348 kg).  Physical Exam: Well-nourished well-developed female in no acute distress.  Pelvic: External genitalia status post excision of the left labia. The area is well-healed. The inferior left area is just consistent with a pale the biopsy site that was positive for Paget's disease. In addition on the left, at the bulbous area of the labia majora, there is a pink area.  Assessment/Plan: 36 -year-old with recurrent vulvar Paget's disease. She is to be seen by urology tomorrow. They believe there is something that needs to be done with  her sling we can  do it as a combined procedure with her wide local excision. If they do not feel that they need to have anything done regarding her sling we will schedule her wide local excisions in the operating room. She is to contact us tomorrow or Friday after her visit with them. We'll check a urinalysis today.  Forrestine Lecrone A., MD 06/15/2013, 11:36 AM

## 2013-07-04 ENCOUNTER — Other Ambulatory Visit: Payer: Self-pay

## 2013-07-04 DIAGNOSIS — Z9889 Other specified postprocedural states: Secondary | ICD-10-CM

## 2013-07-04 DIAGNOSIS — Z9011 Acquired absence of right breast and nipple: Secondary | ICD-10-CM

## 2013-07-04 DIAGNOSIS — Z1231 Encounter for screening mammogram for malignant neoplasm of breast: Secondary | ICD-10-CM

## 2013-07-21 NOTE — Progress Notes (Signed)
Surgery on 08/02/13.  preop on 07/28/13 at 1130am.  Need orders in EPIC.  Thank You.

## 2013-07-25 ENCOUNTER — Encounter (HOSPITAL_COMMUNITY): Payer: Self-pay | Admitting: Pharmacy Technician

## 2013-07-27 ENCOUNTER — Telehealth: Payer: Self-pay | Admitting: Oncology

## 2013-07-27 NOTE — Progress Notes (Signed)
Chest 6/14, EKG 1/15 EPIC

## 2013-07-27 NOTE — Telephone Encounter (Signed)
Pt can be dischange per Dr. Darnell Level

## 2013-07-28 ENCOUNTER — Encounter (HOSPITAL_COMMUNITY)
Admission: RE | Admit: 2013-07-28 | Discharge: 2013-07-28 | Disposition: A | Discharge status: Home / Self Care | Payer: Medicare Other | Source: Ambulatory Visit | Attending: Gynecologic Oncology | Admitting: Gynecologic Oncology

## 2013-07-28 ENCOUNTER — Other Ambulatory Visit: Payer: Self-pay

## 2013-07-28 ENCOUNTER — Encounter (HOSPITAL_COMMUNITY): Payer: Self-pay

## 2013-07-28 DIAGNOSIS — Z01810 Encounter for preprocedural cardiovascular examination: Secondary | ICD-10-CM | POA: Insufficient documentation

## 2013-07-28 DIAGNOSIS — Z01812 Encounter for preprocedural laboratory examination: Secondary | ICD-10-CM | POA: Insufficient documentation

## 2013-07-28 HISTORY — DX: Unspecified macular degeneration: H35.30

## 2013-07-28 HISTORY — DX: Other seasonal allergic rhinitis: J30.2

## 2013-07-28 LAB — CBC
HEMATOCRIT: 38.6 % (ref 36.0–46.0)
HEMOGLOBIN: 12.5 g/dL (ref 12.0–15.0)
MCH: 28.2 pg (ref 26.0–34.0)
MCHC: 32.4 g/dL (ref 30.0–36.0)
MCV: 87.1 fL (ref 78.0–100.0)
Platelets: 147 10*3/uL — ABNORMAL LOW (ref 150–400)
RBC: 4.43 MIL/uL (ref 3.87–5.11)
RDW: 14.3 % (ref 11.5–15.5)
WBC: 3.6 10*3/uL — AB (ref 4.0–10.5)

## 2013-07-28 LAB — BASIC METABOLIC PANEL
BUN: 10 mg/dL (ref 6–23)
CHLORIDE: 100 meq/L (ref 96–112)
CO2: 28 meq/L (ref 19–32)
CREATININE: 0.57 mg/dL (ref 0.50–1.10)
Calcium: 9.5 mg/dL (ref 8.4–10.5)
GFR calc Af Amer: >90 mL/min (ref >90)
GFR calc non Af Amer: >90 mL/min (ref >90)
Glucose, Bld: 136 mg/dL — ABNORMAL HIGH (ref 70–99)
Potassium: 4.2 mEq/L (ref 3.7–5.3)
Sodium: 139 mEq/L (ref 137–147)

## 2013-07-28 NOTE — Patient Instructions (Signed)
      Your procedure is scheduled on:  08/02/13  TUESDAY  Report to [facility] at   1055    AM.  Call this number if you have problems the morning of surgery: [phone]        Do not eat food  After Midnight.MONDAY NIGHT--- MAY HAVE CLEAR LIQUIDS until 0725am  Tuesday MORNING THEN NONE   Take these medicines the morning of surgery with A SIP OF WATER: Wellbutrin, Citalopram, Levothyroxine, Pantoprazole                                                    May use Albuterol if needed Bring inhaler with you to hospital   .  Contacts, dentures or partial plates, or metal hairpins  can not be worn to surgery. Your family will be responsible for glasses, dentures, hearing aides while you are in surgery  Leave suitcase in the car. After surgery it may be brought to your room.  For patients admitted to the hospital, checkout time is 11:00 AM day of  discharge.                DO NOT WEAR JEWELRY, LOTIONS, POWDERS, OR PERFUMES.  WOMEN-- DO NOT SHAVE LEGS OR UNDERARMS FOR 48 HOURS BEFORE SHOWERS. MEN MAY SHAVE FACE.  Patients discharged the day of surgery will not be allowed to drive home. IF going home the day of surgery, you must have a driver and someone to stay with you for the first 24 hours  Name and phone number of your driver:       husnand   MIKE                                                                 Please read over the following fact sheets that you were given:, [place]                                                  Patient Signature _____________________________

## 2013-07-28 NOTE — Progress Notes (Signed)
Per EPIC,  CBC has been reviewed by Heywood Iles NP

## 2013-07-28 NOTE — Progress Notes (Signed)
LAST EKG EPIC  1/14  Not 1/15.  Repeated at PST

## 2013-07-28 NOTE — Progress Notes (Signed)
07/28/13 1155  OBSTRUCTIVE SLEEP APNEA  Have you ever been diagnosed with sleep apnea through a sleep study? No  Do you snore loudly (loud enough to be heard through closed doors)?  1  Do you often feel tired, fatigued, or sleepy during the daytime? 1  Has anyone observed you stop breathing during your sleep? 0  Do you have, or are you being treated for high blood pressure? 0  BMI more than 35 kg/m2? 1  Age over 68 years old? 1  Neck circumference greater than 40 cm/18 inches? 0  Gender: 0  Obstructive Sleep Apnea Score 4  Score 4 or greater  Results sent to PCP

## 2013-08-01 ENCOUNTER — Telehealth: Payer: Self-pay | Admitting: *Deleted

## 2013-08-01 NOTE — Telephone Encounter (Signed)
Called pt with reminder,NPO after midnight, clear liquids only today. Pt verbalized understanding.

## 2013-08-02 ENCOUNTER — Encounter (HOSPITAL_COMMUNITY): Payer: Self-pay | Admitting: *Deleted

## 2013-08-02 ENCOUNTER — Ambulatory Visit (HOSPITAL_COMMUNITY)
Admission: RE | Admit: 2013-08-02 | Discharge: 2013-08-02 | Disposition: A | Discharge status: Home / Self Care | Payer: Medicare Other | Source: Ambulatory Visit | Attending: Gynecologic Oncology | Admitting: Gynecologic Oncology

## 2013-08-02 ENCOUNTER — Ambulatory Visit (HOSPITAL_COMMUNITY): Payer: Medicare Other | Admitting: Certified Registered Nurse Anesthetist

## 2013-08-02 ENCOUNTER — Other Ambulatory Visit: Payer: Self-pay | Admitting: Gynecologic Oncology

## 2013-08-02 ENCOUNTER — Encounter (HOSPITAL_COMMUNITY): Payer: Medicare Other | Admitting: Certified Registered Nurse Anesthetist

## 2013-08-02 ENCOUNTER — Encounter (HOSPITAL_COMMUNITY)
Admission: RE | Disposition: A | Discharge status: Home / Self Care | Payer: Self-pay | Source: Ambulatory Visit | Attending: Gynecologic Oncology

## 2013-08-02 DIAGNOSIS — R10813 Right lower quadrant abdominal tenderness: Secondary | ICD-10-CM

## 2013-08-02 DIAGNOSIS — F329 Major depressive disorder, single episode, unspecified: Secondary | ICD-10-CM | POA: Insufficient documentation

## 2013-08-02 DIAGNOSIS — Z87891 Personal history of nicotine dependence: Secondary | ICD-10-CM | POA: Insufficient documentation

## 2013-08-02 DIAGNOSIS — C519 Malignant neoplasm of vulva, unspecified: Secondary | ICD-10-CM | POA: Insufficient documentation

## 2013-08-02 DIAGNOSIS — Z79899 Other long term (current) drug therapy: Secondary | ICD-10-CM | POA: Insufficient documentation

## 2013-08-02 DIAGNOSIS — C4499 Other specified malignant neoplasm of skin, unspecified: Secondary | ICD-10-CM

## 2013-08-02 DIAGNOSIS — J45909 Unspecified asthma, uncomplicated: Secondary | ICD-10-CM | POA: Insufficient documentation

## 2013-08-02 DIAGNOSIS — F3289 Other specified depressive episodes: Secondary | ICD-10-CM | POA: Insufficient documentation

## 2013-08-02 DIAGNOSIS — Z853 Personal history of malignant neoplasm of breast: Secondary | ICD-10-CM | POA: Insufficient documentation

## 2013-08-02 DIAGNOSIS — IMO0001 Reserved for inherently not codable concepts without codable children: Secondary | ICD-10-CM | POA: Insufficient documentation

## 2013-08-02 DIAGNOSIS — E039 Hypothyroidism, unspecified: Secondary | ICD-10-CM | POA: Insufficient documentation

## 2013-08-02 DIAGNOSIS — Z96659 Presence of unspecified artificial knee joint: Secondary | ICD-10-CM | POA: Insufficient documentation

## 2013-08-02 DIAGNOSIS — K219 Gastro-esophageal reflux disease without esophagitis: Secondary | ICD-10-CM | POA: Insufficient documentation

## 2013-08-02 HISTORY — PX: VULVECTOMY: SHX1086

## 2013-08-02 SURGERY — WIDE EXCISION VULVECTOMY
Anesthesia: General | Site: Vagina

## 2013-08-02 MED ORDER — ACETIC ACID 5 % SOLN
1.0000 "application " | Freq: Once | Status: AC
  Administered 2013-08-02: 1 via TOPICAL
  Filled 2013-08-02: qty 500

## 2013-08-02 MED ORDER — PROMETHAZINE HCL 25 MG/ML IJ SOLN
INTRAMUSCULAR | Status: AC
  Filled 2013-08-02: qty 1

## 2013-08-02 MED ORDER — ONDANSETRON HCL 4 MG/2ML IJ SOLN: 4.0000 mg | Freq: Four times a day (QID) | INTRAMUSCULAR | Status: DC | PRN

## 2013-08-02 MED ORDER — SODIUM CHLORIDE 0.9 % IJ SOLN: 3.0000 mL | INTRAMUSCULAR | Status: DC | PRN

## 2013-08-02 MED ORDER — CISATRACURIUM BESYLATE 20 MG/10ML IV SOLN
INTRAVENOUS | Status: AC
  Filled 2013-08-02: qty 10

## 2013-08-02 MED ORDER — OXYCODONE HCL 5 MG PO TABS: 5.0000 mg | ORAL_TABLET | ORAL | Status: DC | PRN

## 2013-08-02 MED ORDER — NEOSTIGMINE METHYLSULFATE 1 MG/ML IJ SOLN
INTRAMUSCULAR | Status: DC | PRN
  Administered 2013-08-02: 3 mg via INTRAVENOUS

## 2013-08-02 MED ORDER — SUCCINYLCHOLINE CHLORIDE 20 MG/ML IJ SOLN
INTRAMUSCULAR | Status: DC | PRN
  Administered 2013-08-02: 100 mg via INTRAVENOUS

## 2013-08-02 MED ORDER — LIDOCAINE HCL (CARDIAC) 20 MG/ML IV SOLN
INTRAVENOUS | Status: DC | PRN
  Administered 2013-08-02: 100 mg via INTRAVENOUS

## 2013-08-02 MED ORDER — OXYCODONE-ACETAMINOPHEN 5-325 MG PO TABS: 2.0000 | ORAL_TABLET | Freq: Four times a day (QID) | ORAL | Status: DC | PRN

## 2013-08-02 MED ORDER — PROMETHAZINE HCL 25 MG/ML IJ SOLN
6.2500 mg | INTRAMUSCULAR | Status: DC | PRN
  Administered 2013-08-02: 6.25 mg via INTRAVENOUS

## 2013-08-02 MED ORDER — SODIUM CHLORIDE 0.9 % IJ SOLN
INTRAMUSCULAR | Status: AC
  Filled 2013-08-02: qty 10

## 2013-08-02 MED ORDER — ONDANSETRON HCL 4 MG/2ML IJ SOLN
INTRAMUSCULAR | Status: DC | PRN
  Administered 2013-08-02: 4 mg via INTRAVENOUS

## 2013-08-02 MED ORDER — BUPIVACAINE HCL 0.25 % IJ SOLN
INTRAMUSCULAR | Status: AC
  Filled 2013-08-02: qty 1

## 2013-08-02 MED ORDER — LACTATED RINGERS IV SOLN
INTRAVENOUS | Status: DC
  Administered 2013-08-02: 14:00:00 via INTRAVENOUS
  Administered 2013-08-02: 1000 mL via INTRAVENOUS

## 2013-08-02 MED ORDER — BUPIVACAINE-EPINEPHRINE 0.25% -1:200000 IJ SOLN
INTRAMUSCULAR | Status: AC
  Filled 2013-08-02: qty 1

## 2013-08-02 MED ORDER — MIDAZOLAM HCL 2 MG/2ML IJ SOLN
INTRAMUSCULAR | Status: AC
  Filled 2013-08-02: qty 2

## 2013-08-02 MED ORDER — ACETAMINOPHEN 325 MG PO TABS: 650.0000 mg | ORAL_TABLET | ORAL | Status: DC | PRN

## 2013-08-02 MED ORDER — SODIUM CHLORIDE 0.9 % IV SOLN
250.0000 mL | INTRAVENOUS | Status: DC | PRN
Start: 2013-08-02 — End: 2013-08-02

## 2013-08-02 MED ORDER — ACETAMINOPHEN 650 MG RE SUPP
650.0000 mg | RECTAL | Status: DC | PRN
  Filled 2013-08-02: qty 1

## 2013-08-02 MED ORDER — LIDOCAINE HCL (CARDIAC) 20 MG/ML IV SOLN
INTRAVENOUS | Status: AC
  Filled 2013-08-02: qty 5

## 2013-08-02 MED ORDER — CISATRACURIUM BESYLATE (PF) 10 MG/5ML IV SOLN
INTRAVENOUS | Status: DC | PRN
  Administered 2013-08-02: 6 mg via INTRAVENOUS

## 2013-08-02 MED ORDER — DEXAMETHASONE SODIUM PHOSPHATE 10 MG/ML IJ SOLN
INTRAMUSCULAR | Status: AC
  Filled 2013-08-02: qty 1

## 2013-08-02 MED ORDER — FENTANYL CITRATE 0.05 MG/ML IJ SOLN
INTRAMUSCULAR | Status: DC | PRN
  Administered 2013-08-02 (×3): 50 ug via INTRAVENOUS

## 2013-08-02 MED ORDER — PROPOFOL 10 MG/ML IV BOLUS
INTRAVENOUS | Status: AC
  Filled 2013-08-02: qty 20

## 2013-08-02 MED ORDER — FENTANYL CITRATE 0.05 MG/ML IJ SOLN
INTRAMUSCULAR | Status: AC
  Filled 2013-08-02: qty 5

## 2013-08-02 MED ORDER — ONDANSETRON HCL 4 MG/2ML IJ SOLN
INTRAMUSCULAR | Status: AC
  Filled 2013-08-02: qty 2

## 2013-08-02 MED ORDER — PROPOFOL 10 MG/ML IV BOLUS
INTRAVENOUS | Status: DC | PRN
  Administered 2013-08-02: 200 mg via INTRAVENOUS

## 2013-08-02 MED ORDER — DEXAMETHASONE SODIUM PHOSPHATE 10 MG/ML IJ SOLN
INTRAMUSCULAR | Status: DC | PRN
  Administered 2013-08-02: 10 mg via INTRAVENOUS

## 2013-08-02 MED ORDER — SODIUM CHLORIDE 0.9 % IJ SOLN: 3.0000 mL | Freq: Two times a day (BID) | INTRAMUSCULAR | Status: DC

## 2013-08-02 MED ORDER — MIDAZOLAM HCL 5 MG/5ML IJ SOLN
INTRAMUSCULAR | Status: DC | PRN
  Administered 2013-08-02 (×2): 1 mg via INTRAVENOUS

## 2013-08-02 MED ORDER — BUPIVACAINE HCL 0.25 % IJ SOLN
INTRAMUSCULAR | Status: DC | PRN
  Administered 2013-08-02: 10 mL

## 2013-08-02 MED ORDER — GLYCOPYRROLATE 0.2 MG/ML IJ SOLN
INTRAMUSCULAR | Status: DC | PRN
  Administered 2013-08-02: .6 mg via INTRAVENOUS

## 2013-08-02 MED ORDER — FENTANYL CITRATE 0.05 MG/ML IJ SOLN
25.0000 ug | INTRAMUSCULAR | Status: DC | PRN
Start: 2013-08-02 — End: 2013-08-02

## 2013-08-02 SURGICAL SUPPLY — 30 items
BLADE SURG 15 STRL LF DISP TIS (BLADE) ×2 IMPLANT
BLADE SURG 15 STRL SS (BLADE) ×4
CANISTER SUCTION 2500CC (MISCELLANEOUS) ×2 IMPLANT
COVER SURGICAL LIGHT HANDLE (MISCELLANEOUS) ×2 IMPLANT
DRAPE LG THREE QUARTER DISP (DRAPES) ×2 IMPLANT
DRAPE SURG IRRIG POUCH 19X23 (DRAPES) ×2 IMPLANT
GAUZE SPONGE 4X4 16PLY XRAY LF (GAUZE/BANDAGES/DRESSINGS) ×4 IMPLANT
GLOVE BIO SURGEON STRL SZ 6.5 (GLOVE) ×4 IMPLANT
GLOVE BIO SURGEON STRL SZ7.5 (GLOVE) ×4 IMPLANT
GLOVE BIOGEL PI IND STRL 7.0 (GLOVE) ×1 IMPLANT
GLOVE BIOGEL PI INDICATOR 7.0 (GLOVE) ×1
NEEDLE HYPO 22GX1.5 SAFETY (NEEDLE) ×2 IMPLANT
NS IRRIG 1000ML POUR BTL (IV SOLUTION) ×2 IMPLANT
PACK ICE MAXI GEL EZY WRAP (MISCELLANEOUS) ×1 IMPLANT
PACK MINOR VAGINAL W LONG (CUSTOM PROCEDURE TRAY) ×2 IMPLANT
PENCIL BUTTON HOLSTER BLD 10FT (ELECTRODE) ×2 IMPLANT
SPONGE GAUZE 4X4 12PLY (GAUZE/BANDAGES/DRESSINGS) ×2 IMPLANT
SUT VIC AB 2-0 CT2 27 (SUTURE) ×5 IMPLANT
SUT VIC AB 2-0 SH 27 (SUTURE) ×2
SUT VIC AB 2-0 SH 27X BRD (SUTURE) IMPLANT
SUT VIC AB 3-0 PS2 18 (SUTURE)
SUT VIC AB 3-0 PS2 18XBRD (SUTURE) IMPLANT
SUT VIC AB 4-0 PS2 27 (SUTURE) ×4 IMPLANT
SUT VICRYL 2 0 18  UND BR (SUTURE)
SUT VICRYL 2 0 18 UND BR (SUTURE) IMPLANT
SYR CONTROL 10ML LL (SYRINGE) ×2 IMPLANT
TOWEL OR 17X26 10 PK STRL BLUE (TOWEL DISPOSABLE) ×2 IMPLANT
TRAY FOLEY CATH 14FRSI W/METER (CATHETERS) ×4 IMPLANT
WATER STERILE IRR 1500ML POUR (IV SOLUTION) ×2 IMPLANT
YANKAUER SUCT BULB TIP 10FT TU (MISCELLANEOUS) ×2 IMPLANT

## 2013-08-02 NOTE — Op Note (Signed)
PATIENT: [name] DATE OF BIRTH: Oct 04, 1945 ENCOUNTER DATE: 08/02/2013   Preop Diagnosis: Extramammary Pagets disease  Postoperative Diagnosis: same.   Surgery:  Wide local excision of the vulva x2  Surgeons:  Imagene Gurney A. Alycia Rossetti, MD; Lahoma Crocker, MD   Anesthesia: General   Estimated blood loss:  25 ml   IVF: 1000 ml   Urine output: 427 ml   Complications: None   Pathology: Left upper vulva with suture at 12:00, left inferior vulva with suture at 3:00  Operative findings: Pale pink raised lesion 1.0 cm x.0.5 cm lesion on left upper vulva near prior excision site.  Pale raised patch measuring 1.5 cm x 1.0 cm at left crossing midline of perineal body.  Procedure: The patient was identified in the preoperative holding area. Informed consent was signed on the chart. Patient was seen history was reviewed and exam was performed.   The patient was then taken to the operating room and placed in the supine position with SCD hose on. General anesthesia was then induced without difficulty. She was then placed in the dorsolithotomy position. The perineum was then prepped in the usual fashion with Betadine. A 14 French Foley was inserted into the bladder under sterile conditions.   5% acetic acid was applied to the vulva. Upon inspection, the findings as above were noted.  An ellipse was drawn around each lesion with at least a 36mm margin. 10 ml of 1/4% marcaine was injected into both areas. Using the 15 blade the ellipse on the superior left vulva was incised.  The area was made hemostatic with pinpoint cautery.  Using a 4.0 vicryl on a PS2 needle interrupted sutures were used to close the incision.  The specimen was marked at 12:00. A similar procedure was performed on the inferior lesion and marked at 3:00. The incision was similarly closed with a 4.0 vicryl on a PS2 needle in an interrupted fashion. The areas were hemostatic.  All instrument needle and Ray-Tec counts were correct x2.  The patient tolerated the procedure well and was taken to the recovery room in stable condition. This is [name] dictating an operative note on patient [name].

## 2013-08-02 NOTE — Transfer of Care (Signed)
Immediate Anesthesia Transfer of Care Note  Patient: [name]  Procedure(s) Performed: Procedure(s): WIDE LOCAL  EXCISION VULVA (N/A)  Patient Location: PACU  Anesthesia Type:General  Level of Consciousness: awake, alert , oriented, patient cooperative and responds to stimulation  Airway & Oxygen Therapy: Patient Spontanous Breathing and Patient connected to face mask oxygen  Post-op Assessment: Report given to PACU RN, Post -op Vital signs reviewed and stable and Patient moving all extremities  Post vital signs: Reviewed and stable  Complications: No apparent anesthesia complications

## 2013-08-02 NOTE — Anesthesia Postprocedure Evaluation (Signed)
  Anesthesia Post-op Note  Patient: [name]  Procedure(s) Performed: Procedure(s) (LRB): WIDE LOCAL  EXCISION VULVA (N/A)  Patient Location: PACU  Anesthesia Type: General  Level of Consciousness: awake and alert   Airway and Oxygen Therapy: Patient Spontanous Breathing  Post-op Pain: mild  Post-op Assessment: Post-op Vital signs reviewed, Patient's Cardiovascular Status Stable, Respiratory Function Stable, Patent Airway and No signs of Nausea or vomiting  Last Vitals:  Filed Vitals:   08/02/13 1445  BP: 112/62  Pulse: 65  Temp:   Resp: 14    Post-op Vital Signs: stable   Complications: No apparent anesthesia complications

## 2013-08-02 NOTE — Discharge Instructions (Signed)
Vulvectomy, Care After The vulva is the external female genitalia, outside and around the vagina and pubic bone. It consists of:  The skin on, and in front of, the pubic bone.  The clitoris.  The labia majora (large lips) on the outside of the vagina.  The labia minora (small lips) around the opening of the vagina.  The opening and the skin in and around the vagina. A vulvectomy is the removal of the tissue of the vulva, which sometimes includes removal of the lymph nodes and tissue in the groin areas. These discharge instructions provide you with general information on caring for yourself after you leave the hospital. It is also important that you know the warning signs of complications, so that you can seek treatment. Please read the instructions outlined below and refer to this sheet in the next few weeks. Your caregiver may also give you specific information and medicines. If you have any questions or complications after discharge, please call your caregiver. ACTIVITY  Rest as much as possible the first two weeks after discharge.  Arrange to have help from family or others with your daily activities when you go home.  Avoid heavy lifting (more than 5 pounds), pushing, or pulling.  If you feel tired, balance your activity with rest periods.  Follow your caregiver's instruction about climbing stairs and driving a car.  Increase activity gradually.  Do not exercise until you have permission from your caregiver. LEG AND FOOT CARE If your doctor has removed lymph nodes from your groin area, there may be an increase in swelling of your legs and feet. You can help prevent swelling by doing the following:  Elevate your legs while sitting or lying down.  If your caregiver has ordered special stockings, wear them according to instructions.  Avoid standing in one place for long periods of time.  Call the physical therapy department if you have any questions about swelling or treatment  for swelling.  Avoid salt in your diet. It can cause fluid retention and swelling.  Do not cross your legs, especially when sitting. NUTRITION  You may resume your normal diet.  Drink 6 to 8 glasses of fluids a day.  Eat a healthy, balanced diet including portions of food from the meat (protein), milk, fruit, vegetable, and bread groups.  Your caregiver may recommend you take a multivitamin with iron. ELIMINATION  You may notice that your stream of urine is at a different angle, and may tend to spray. Using a plastic funnel may help to decrease urine spray.  If constipation occurs, drink more liquids, and add more fruits, vegetables, and bran to your diet. You may take a mild laxative, such as Milk of Magnesia, Metamucil or a stool softener, such as Colace with permission from your caregiver. HYGIENE  You may shower and wash your hair.  Check with your caregiver about tub baths.  Do not add any bath oils or chemicals to your bath water, after you have permission to take baths.  Clean yourself well after moving your bowels.  After urinating, wipe from top to bottom.  A sitz bath will help keep your perineal area clean, reduce swelling, and provide comfort. HOME CARE INSTRUCTIONS   Take your temperature twice a day and record it, especially if you feel feverish or have chills.  Follow your caregiver's instructions about medicines, activity, and follow-up appointments after surgery.  Do not drink alcohol while taking pain medicine.  Change your dressing as advised by your caregiver.  You may take over-the-counter medicine for pain, recommended by your caregiver.  If your pain is not relieved with medicine, call your caregiver.  Do not take aspirin, because it can cause bleeding.  Do not douche or use tampons (use a non-perfumed sanitary pad).  Do not have sexual intercourse until your caregiver gives you permission. Hugging, kissing, and playful sexual activity is  fine with your caregiver's permission.  Warm sitz baths, with your caregiver's permission, are helpful to control swelling and discomfort.  Take showers instead of baths, until your caregiver gives you permission to take baths.  You may take a mild medicine for constipation, recommended by your caregiver. Bran foods and drinking a lot of fluids will help with constipation.  Make sure your family understands everything about your operation and recovery. SEEK MEDICAL CARE IF:   You notice swelling and redness around the wound area.  You notice a foul smell coming from the wound or on the surgical dressing.  You notice the wound is separating.  You have painful or bloody urination.  You develop nausea and vomiting.  You develop diarrhea.  You develop a rash.  You have a reaction or allergy from the medicine.  You feel dizzy or light headed.  You need stronger pain medicine. SEEK IMMEDIATE MEDICAL CARE IF:   You develop a temperature of 102 F (38.9 C) or higher.  You pass out.  You develop leg or chest pain.  You develop abdominal pain.  You develop shortness of breath.  You develop bleeding from the wound area.  You see pus in the wound area. MAKE SURE YOU:   Understand these instructions.  Will watch your condition.  Will get help right away if you are not doing well or get worse. Document Released: 12/25/2003 Document Revised: 03/02/2013 Document Reviewed: 04/13/2009 [facility] Patient Information 2014 [place], [place].

## 2013-08-02 NOTE — H&P (Signed)
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 Some of the surgical margins were positive. Her postoperative course was uncomplicated. She was last seen by our service in 12/14. At that time there was no evidence of any lesions consistent with Paget's. However, there is considerable amount of excoriations throughout the entire vulvar and medial thighs consistent with chronic vulvitis.In December 2013 she had a hernia repair due to bowel obstruction by Dr. Excell Seltzer with permanent mesh. In May 2013 she also had a TVT bladder by Dr. Everett Graff. She does continue to wear a pad and also takes Detrol twice daily but states that her urinary leakage is markedly improved. She described the vulvar irritation is intermittent itching with burning and that it has increased over time as has the pruritus. She does use a steroid cream twice daily and does scratch her vulva.  She underwent a wide local excision of the left vulva on January 2 of year 2014. Operative findings included 2 separate 3 x 3 and 1.2 cm lesions consistent with Paget's on the left.  Pathology revealed 2 foci of extramammary Paget's disease extending to the left inked margin in the right and anterior tip margins at multiple foci. The posterior margin was negative for malignancy.  I saw her for her postoperative check in February 2014. She comes in is complaining of "bleeding" and scratching her left vulva.  I performed a biopsy of this it was consistent with recurrent Paget's disease. On 11/23/2012 showed a repeat wide local excision of liver x2 with the posterior fourchette biopsy. Findings revealed along the left labia minora/vagina is a Paget's disease measuring approximately 2.5 x 1 cm in the superior aspect of her prior left vulvar excision. The inferior aspect of a similar lesion. 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 On the left  perineal body the area is somewhat excoriated pale. Biopsy was performed in this area.  Pathology revealed:  1. Vulva, biopsy, left inferior  - EXTRAMAMMARY PAGET DISEASE EXTENDING TO THE EDGES OF THE BIOPSY.  2. Vulva, excision, left superior lesion suture marks 1200  - EXTRAMAMMARY PAGET DISEASE.  - MARGINS NOT INVOLVED.  - CLOSEST MARGIN 9 O'CLOCK, LESS THAN 0.1 CM.  3. Vulva, excision, left inferior excision suture marks 1200  - EXTRAMAMMARY PAGET DISEASE, 12 O'CLOCK MARGIN FOCALLY INVOLVED.  - REMAINING MARGINS CLEAR.  We have discussed repeating surgery however she had multiple orthopedic issues had broken ankle. She was last seen by [name] in December 2014. Exam at that time was fairly unremarkable. She comes in today for a repeat exam and discussion of wide local excision versus continued followup.  She is doing fairly well from her left ankle surgery. She's currently able to bear weight on it. She has not yet started her physical therapy. She is complaining of pruritus and she admits to scratching. She is using her steroid cream twice a day. She's had some pink discharge for 2 days last week is likely secondary broken skin from scratching. In December she'll urinalysis that was negative. She continues to complain of pain at initiation of her weight. She is seeing Dr. Chauncey Cruel in urology tomorrow for evaluation.  Current Meds:  Outpatient Encounter Prescriptions as of 06/15/2013   Medication  Sig   .  acetaminophen (TYLENOL) 500 MG tablet  Take 500 mg by mouth every 6 (six) hours as needed for pain.   Marland Kitchen  ALBUTEROL SULFATE HFA IN  Inhale into the lungs. USE 2 PUFFS IF NEEDED FOR WHEEZING   .  beta carotene w/minerals (OCUVITE) tablet  Take 1 tablet by mouth daily.   Marland Kitchen  buPROPion (WELLBUTRIN XL) 150 MG 24 hr tablet  Take 150 mg by mouth 2 (two) times daily.   .  Calcium Carbonate-Vitamin D 600-125 MG-UNIT TABS  Take 2 tablets by mouth daily.   .  citalopram (CELEXA) 40 MG tablet   Take 40 mg by mouth daily.   .  clotrimazole-betamethasone (LOTRISONE) cream  Apply topically 2 (two) times daily.   Marland Kitchen  docusate sodium (COLACE) 100 MG capsule  Take 400 mg by mouth at bedtime as needed for constipation.   .  Fluticasone-Salmeterol (ADVAIR) 250-50 MCG/DOSE AEPB  Inhale 1 puff into the lungs every 12 (twelve) hours.   Marland Kitchen  GREEN COFFEE BEAN PO  Take 1 tablet by mouth daily.   Marland Kitchen  HYDROcodone-acetaminophen (NORCO/VICODIN) 5-325 MG per tablet  Take 2 tablets by mouth every 6 (six) hours as needed for pain.   Marland Kitchen  levothyroxine (SYNTHROID, LEVOTHROID) 175 MCG tablet  Take 175 mcg by mouth every morning.   .  Multiple Vitamins-Minerals (WOMENS 50+ MULTI VITAMIN/MIN PO)  Take 1 tablet by mouth daily.   Marland Kitchen  nystatin-triamcinolone (MYCOLOG II) cream  Apply 1 application topically 3 (three) times daily as needed (for rash).   .  Omega-3 Fatty Acids (FISH OIL) 1200 MG CAPS  Take 2 capsules by mouth daily.   Marland Kitchen  OVER THE COUNTER MEDICATION  Take 2 capsules by mouth 3 (three) times daily. Raspberry keytone 125 mg   .  pantoprazole (PROTONIX) 40 MG tablet  Take 40 mg by mouth 2 (two) times daily.   Marland Kitchen  spironolactone (ALDACTONE) 50 MG tablet  Take 0.5-1 tablets (25-50 mg total) by mouth daily as needed (for fluid).   Marland Kitchen  sulfamethoxazole-trimethoprim (BACTRIM DS) 800-160 MG per tablet  Take 1 tablet by mouth 2 (two) times daily.   .  clindamycin (CLEOCIN) 300 MG capsule  Only when going to the dentist, due to hardware in left leg   .  oxyCODONE (OXY IR/ROXICODONE) 5 MG immediate release tablet    .  Polyethylene Glycol 3350 (MIRALAX PO)  Take 17 g by mouth 3 (three) times daily.   Allergy:  Allergies   Allergen  Reactions   .  Demerol [Meperidine]    .  Diazepam  Other (See Comments)     Knocks me out   .  Tape      "thick clear plastic tape" causes blisters   .  Morphine And Related  Itching and Rash     Per CRNA, pt tol Fentanyl IV for OR case, no issues reported to rec RN in PACU, no  immediate PACU issues noted as well upon arrival   .  Penicillins  Rash   Social Hx:  History    Social History   .  Marital Status:  Married     Spouse Name:  N/A     Number of Children:  N/A   .  Years of Education:  N/A    Occupational History   .  Not on file.    Social History Main Topics   .  Smoking status:  Former Smoker     Types:  Cigarettes     Quit date:  06/05/1999   .  Smokeless tobacco:  Never Used   .  Alcohol Use:  No   .  Drug Use:  No   .  Sexual Activity:  No    Other Topics  Concern   .  Not on file    Social History Narrative   .  No narrative on file   Past Surgical Hx:  Past Surgical History   Procedure  Laterality  Date   .  Mastectomy   1997     right breast   .  Cholecystectomy     .  Knee surgery       x5   .  Joint replacement       right knee x2   .  Joint replacement       left knee x5   .  Back surgery     .  Foot surgery       fused left foot   .  Tonsillectomy   68   .  Hand tendon surgery       right hand tendon tear   .  Rotator cuff repair       right rotator cuff   .  Thyroidectomy     .  Incisional hernia repair   05/05/2011     Procedure: LAPAROSCOPIC INCISIONAL HERNIA; Surgeon: Edward Jolly, MD; Location: [facility]; Service: General; Laterality: N/A; LAPAROSCOPIC REPAIR INCARCERATED INCISIONAL HERNIA WITH MESH   .  Hernia repair   05/05/11     emergency VH and incisional hernia repair   .  Bladder suspension   10/13/2011     Procedure: TRANSVAGINAL TAPE (TVT) PROCEDURE; Surgeon: Delice Lesch, MD; Location: [facility]; Service: Gynecology; Laterality: N/A;   .  Cystoscopy   10/13/2011     Procedure: CYSTOSCOPY; Surgeon: Delice Lesch, MD; Location: [facility]; Service: Gynecology; Laterality: Bilateral;   .  Vulvectomy   05/27/2012     Procedure: WIDE EXCISION VULVECTOMY; Surgeon: Imagene Gurney A. Alycia Rossetti, MD; Location: [facility]; Service: Gynecology; Laterality: N/A; Wide Local Excision of Vulva   .  Breast surgery     .  Breast  lumpectomy       LEFT BREAST-CA   .  Vulvectomy  N/A  11/23/2012     Procedure: WIDE LOCAL EXCISION OF THE VULVA; Surgeon: Imagene Gurney A. Alycia Rossetti, MD; Location: [facility]; Service: Gynecology; Laterality: N/A; left inferior vulvar biopsy  excision left superior lesion  left inferior vulvar excision   Past Medical Hx:  Past Medical History   Diagnosis  Date   .  Cancer    .  Asthma    .  Blood transfusion    .  GERD (gastroesophageal reflux disease)    .  Thyroid disease    .  Allergy    .  PONV (postoperative nausea and vomiting)    .  Breast cancer, stage 2  05/28/2011   .  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  Hypothyroid  05/28/2011   .  GERD (gastroesophageal reflux disease)  05/28/2011   .  Fibromyalgia  05/28/2011   .  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  Mild depression  05/28/2011   .  Thrombocytopenia, unspecified  05/28/2011   .  Hx of migraines    .  Right rib fracture  10/26/12     PT FELL AND CXR SHOWED RIGHT LATERAL 8th OR 9th RIB FRACTURE. PT STATES SORENESS RESOLVED - AND BREATHING GOOD   Family Hx:  Family History  Problem  Relation  Age of Onset   .  Cancer  Father  68     lung and kidney   .  Alzheimer's disease  Mother    .  Alcohol abuse  Brother    .  Heart attack  Maternal Grandmother    Vitals: Blood pressure 138/80, pulse 75, temperature 98.1 F (36.7 C), temperature source Oral, resp. rate 16, height 5\' 5"  (1.651 m), weight 256 lb 8 oz (116.348 kg).  Physical Exam:  Well-nourished well-developed female in no acute distress.  Pelvic: External genitalia status post excision of the left labia. The area is well-healed. The inferior left area is just consistent with a pale the biopsy site that was positive for Paget's disease. In addition on the left, at the bulbous area of the labia majora, there is a pink area.  Assessment/Plan:  23 -year-old with recurrent vulvar Paget's disease. She is to be seen by urology tomorrow. They believe  there is something that needs to be done with her sling we can do it as a combined procedure with her wide local excision. If they do not feel that they need to have anything done regarding her sling we will schedule her wide local excisions in the operating room.

## 2013-08-02 NOTE — Anesthesia Preprocedure Evaluation (Signed)
Anesthesia Evaluation  Patient identified by MRN, date of birth, ID band Patient awake    Reviewed: Allergy & Precautions, H&P , NPO status , Patient's Chart, lab work & pertinent test results  History of Anesthesia Complications (+) PONV and history of anesthetic complications  Airway Mallampati: II TM Distance: >3 FB Neck ROM: full    Dental  (+) Missing, Loose, Dental Advisory Given,    Pulmonary asthma , former smoker,  breath sounds clear to auscultation  Pulmonary exam normal       Cardiovascular Exercise Tolerance: Good negative cardio ROS  Rhythm:regular Rate:Normal     Neuro/Psych PSYCHIATRIC DISORDERS Depression negative neurological ROS  negative psych ROS   GI/Hepatic negative GI ROS, Neg liver ROS, GERD-  Medicated and Controlled,  Endo/Other  negative endocrine ROSMorbid obesity  Renal/GU negative Renal ROS  negative genitourinary   Musculoskeletal  (+) Fibromyalgia -  Abdominal   Peds  Hematology negative hematology ROS (+)   Anesthesia Other Findings   Reproductive/Obstetrics negative OB ROS                           Anesthesia Physical  Anesthesia Plan  ASA: III  Anesthesia Plan: General   Post-op Pain Management:    Induction: Intravenous, Rapid sequence and Cricoid pressure planned  Airway Management Planned: Oral ETT  Additional Equipment:   Intra-op Plan:   Post-operative Plan: Extubation in OR  Informed Consent: I have reviewed the patients History and Physical, chart, labs and discussed the procedure including the risks, benefits and alternatives for the proposed anesthesia with the patient or authorized representative who has indicated his/her understanding and acceptance.   Dental Advisory Given  Plan Discussed with: CRNA and Surgeon  Anesthesia Plan Comments:         Anesthesia Quick Evaluation

## 2013-08-02 NOTE — Interval H&P Note (Signed)
History and Physical Interval Note:  08/02/2013 12:47 PM  [name]  has presented today for surgery, with the diagnosis of PAGET'S DISEASE OF THE VULVA  The various methods of treatment have been discussed with the patient and family. After consideration of risks, benefits and other options for treatment, the patient has consented to  Procedure(s): WIDE LOCAL  EXCISION VULVA (N/A) as a surgical intervention .  The patient's history has been reviewed, patient examined, no change in status, stable for surgery.  I have reviewed the patient's chart and labs.  Questions were answered to the patient's satisfaction.     Salley A.

## 2013-08-02 NOTE — Preoperative (Signed)
Beta Blockers   Reason not to administer Beta Blockers:Not Applicable 

## 2013-08-03 ENCOUNTER — Telehealth: Payer: Self-pay | Admitting: *Deleted

## 2013-08-03 ENCOUNTER — Encounter (HOSPITAL_COMMUNITY): Payer: Self-pay | Admitting: Gynecologic Oncology

## 2013-08-03 DIAGNOSIS — N904 Leukoplakia of vulva: Secondary | ICD-10-CM

## 2013-08-03 NOTE — Telephone Encounter (Signed)
Called pt to check on post op status. Pt reports headache above right eye. Took 2 tylenol this am. Pt denies difficulty with vision, floaters, sharp or stabbing pain. Pt to call back this afternoon to report if headache is better or worse. No other complaints. Gave pt appt for [place] on 08/29/13 at 1:45pm. Pt to show up with full bladder. Pt verbalized understanding. No other concerns.

## 2013-08-05 ENCOUNTER — Telehealth: Payer: Self-pay | Admitting: Gynecologic Oncology

## 2013-08-05 NOTE — Telephone Encounter (Signed)
Pt informed of final path report.  Doing well post-operatively.  Advised to call for any questions or concerns.

## 2013-08-10 ENCOUNTER — Ambulatory Visit: Payer: Medicare Other | Admitting: Gynecologic Oncology

## 2013-08-12 ENCOUNTER — Ambulatory Visit
Admission: RE | Admit: 2013-08-12 | Discharge: 2013-08-12 | Disposition: A | Discharge status: Home / Self Care | Payer: Medicare Other | Source: Ambulatory Visit

## 2013-08-12 DIAGNOSIS — Z1231 Encounter for screening mammogram for malignant neoplasm of breast: Secondary | ICD-10-CM

## 2013-08-12 DIAGNOSIS — Z9889 Other specified postprocedural states: Secondary | ICD-10-CM

## 2013-08-12 DIAGNOSIS — Z9011 Acquired absence of right breast and nipple: Secondary | ICD-10-CM

## 2013-08-15 ENCOUNTER — Telehealth: Payer: Self-pay | Admitting: *Deleted

## 2013-08-15 NOTE — Telephone Encounter (Signed)
Called pt notified mammogram normal. Pt verbalized understanding. No further concerns.

## 2013-08-17 ENCOUNTER — Encounter (HOSPITAL_COMMUNITY): Payer: Self-pay | Admitting: Gynecologic Oncology

## 2013-08-17 NOTE — Addendum Note (Signed)
Addendum created 08/17/13 0758 by Montez Hageman, MD   Modules edited: Anesthesia Events

## 2013-08-29 ENCOUNTER — Ambulatory Visit (HOSPITAL_COMMUNITY)
Admission: RE | Admit: 2013-08-29 | Discharge: 2013-08-29 | Disposition: A | Discharge status: Home / Self Care | Payer: Medicare Other | Source: Ambulatory Visit | Attending: Gynecologic Oncology | Admitting: Gynecologic Oncology

## 2013-08-29 DIAGNOSIS — D259 Leiomyoma of uterus, unspecified: Secondary | ICD-10-CM | POA: Insufficient documentation

## 2013-08-29 DIAGNOSIS — R1031 Right lower quadrant pain: Secondary | ICD-10-CM | POA: Insufficient documentation

## 2013-08-29 DIAGNOSIS — R10813 Right lower quadrant abdominal tenderness: Secondary | ICD-10-CM

## 2013-08-30 ENCOUNTER — Telehealth: Payer: Self-pay | Admitting: *Deleted

## 2013-08-30 NOTE — Telephone Encounter (Signed)
Called pt gave [place] results. Pt verbalized understanding, no further concerns.

## 2013-08-31 ENCOUNTER — Telehealth: Payer: Self-pay | Admitting: Gynecologic Oncology

## 2013-08-31 DIAGNOSIS — R609 Edema, unspecified: Secondary | ICD-10-CM

## 2013-08-31 DIAGNOSIS — C4499 Other specified malignant neoplasm of skin, unspecified: Secondary | ICD-10-CM

## 2013-08-31 MED ORDER — SPIRONOLACTONE 25 MG PO TABS: 12.5000 mg | ORAL_TABLET | ORAL | Status: DC | PRN

## 2013-08-31 NOTE — Telephone Encounter (Signed)
Spoke with the pt about her ultrasound.  Advised to call for any questions or concerns.

## 2013-09-07 ENCOUNTER — Encounter: Payer: Self-pay | Admitting: Gynecologic Oncology

## 2013-09-07 ENCOUNTER — Ambulatory Visit: Payer: Medicare Other | Attending: Gynecologic Oncology | Admitting: Gynecologic Oncology

## 2013-09-07 VITALS — BP 154/72 | HR 81 | Temp 98.4°F | Resp 20 | Wt 243.0 lb

## 2013-09-07 DIAGNOSIS — J45909 Unspecified asthma, uncomplicated: Secondary | ICD-10-CM | POA: Insufficient documentation

## 2013-09-07 DIAGNOSIS — Z853 Personal history of malignant neoplasm of breast: Secondary | ICD-10-CM | POA: Insufficient documentation

## 2013-09-07 DIAGNOSIS — Z79899 Other long term (current) drug therapy: Secondary | ICD-10-CM | POA: Insufficient documentation

## 2013-09-07 DIAGNOSIS — K219 Gastro-esophageal reflux disease without esophagitis: Secondary | ICD-10-CM | POA: Insufficient documentation

## 2013-09-07 DIAGNOSIS — M889 Osteitis deformans of unspecified bone: Secondary | ICD-10-CM | POA: Insufficient documentation

## 2013-09-07 DIAGNOSIS — D071 Carcinoma in situ of vulva: Secondary | ICD-10-CM

## 2013-09-07 DIAGNOSIS — IMO0001 Reserved for inherently not codable concepts without codable children: Secondary | ICD-10-CM

## 2013-09-07 DIAGNOSIS — E039 Hypothyroidism, unspecified: Secondary | ICD-10-CM | POA: Insufficient documentation

## 2013-09-07 DIAGNOSIS — Z87891 Personal history of nicotine dependence: Secondary | ICD-10-CM | POA: Insufficient documentation

## 2013-09-07 NOTE — Patient Instructions (Signed)
Return to clinic in 4 months. Use Dove for personal washing. May also use a small amount of baby oil on the areas of dry skin to help with moisturizing.

## 2013-09-07 NOTE — Progress Notes (Signed)
Consult Note: Gyn-Onc  [name] 68 y.o. female  CC:  Chief Complaint  Patient presents with  . 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 Some of the surgical margins were positive. Her postoperative course was uncomplicated. She was last seen by our service in 12/14. At that time there was no evidence of any lesions consistent with Paget's. However, there is considerable amount of excoriations throughout the entire vulvar and medial thighs consistent with chronic vulvitis.In December 2013 she had a hernia repair due to bowel obstruction by Dr. Excell Seltzer with permanent mesh. In May 2013 she also had a TVT bladder by Dr. Everett Graff. She does continue to wear a pad and also takes Detrol twice daily but states that her urinary leakage is markedly improved. She described the vulvar irritation is intermittent itching with burning and that it has increased over time as has the pruritus. She does use a steroid cream twice daily and does scratch her vulva.   She underwent a wide local excision of the left vulva on January 2 of year 2014. Operative findings included 2 separate 3 x 3 and 1.2 cm lesions consistent with Paget's on the left.  Pathology revealed 2 foci of extramammary Paget's disease extending to the left inked margin in the right and anterior tip margins at multiple foci. The posterior margin was negative for malignancy.   I performed a biopsy of this it was consistent with recurrent Paget's disease. On 11/23/2012 showed a repeat wide local excision of liver x2 with the posterior fourchette biopsy. Findings revealed along the left labia minora/vagina is a Paget's disease measuring approximately 2.5 x 1 cm in the superior aspect of her prior left vulvar excision. The inferior aspect of a similar lesion. 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 On the  left perineal body the area is somewhat excoriated pale. Biopsy was performed in this area.  Pathology revealed:  1. Vulva, biopsy, left inferior - EXTRAMAMMARY PAGET DISEASE EXTENDING TO THE EDGES OF THE BIOPSY. 2. Vulva, excision, left superior lesion suture marks 1200 - EXTRAMAMMARY PAGET DISEASE. - MARGINS NOT INVOLVED. - CLOSEST MARGIN 9 O'CLOCK, LESS THAN 0.1 CM. 3. Vulva, excision, left inferior excision suture marks 1200 - EXTRAMAMMARY PAGET DISEASE, 12 O'CLOCK MARGIN FOCALLY INVOLVED. - REMAINING MARGINS CLEAR.  We have discussed repeating surgery however she had multiple orthopedic issues had broken ankle. She was last seen by [name] in December 2014. Exam at that time was fairly unremarkable.  I had last seen her in January of 2015. At that time exam was concerning for Paget's. On 08/02/2013 she underwent wide local excisions of the vulva x2. Operative findings included pale pink raised lesion measuring 1 x 0.5 cm the left upper vulva near the prior excision site. There is a pale raised lesion measuring 1.5 x 1 cm on the left crossing midline perineal body. Pathology revealed left superior vulvar excision with extramammary Paget's disease extending to the 9:00 margin. The other margins were negative for tumor. Of the left of midline lesion she extramammary Paget's disease extending to the 12:00 margin and focally at the 6:00 margin but otherwise negative margins. The patient at that time discuss with her she was having some right lower quadrant pain. Exam is very difficult secondary to habitus. There ultrasound was obtained. Ultrasound revealed:  FINDINGS:  UterusMeasurements: 4.6 x 3.5  x 3.8 cm. 2.5 x 1.7 x 1.6 cm uterine fibroid. This is present in the right lateral aspect of the body uterus.  Endometrium Thickness: 6.1 mm. No focal abnormality visualized.  Right ovary: No focal abnormality.  Left ovary :No focal abnormality.  Other findings: No free fluid  IMPRESSION:   Uterine fibroid otherwise negative exam.  She comes in today for her postoperative check. She's doing fairly well. She does complain of some itching on the mons in status got worse and she was using dial soap for her personal washing. She is switched over the [facility] it is diminished somewhat. She's had no bleeding. She's healed well from her surgery. She continues to complain of the right lower quadrant discomfort. She primarily notices it when she is voiding. She is noticing increased pressure when she voids. She had a sling procedure 2 years ago, May. She was doing fine but intermittently for the past few months she's had pain is been increasing slowly over the last few months. She denies any blood in her urine. She has a fevers or chills. She denies any back pain.  Current Meds:  Outpatient Encounter Prescriptions as of 09/07/2013  Medication Sig  . oxyCODONE (OXY IR/ROXICODONE) 5 MG immediate release tablet Take 5 mg by mouth.  Marland Kitchen acetaminophen (TYLENOL) 500 MG tablet Take 500 mg by mouth every 6 (six) hours as needed for pain.  Marland Kitchen albuterol (PROVENTIL HFA;VENTOLIN HFA) 108 (90 BASE) MCG/ACT inhaler Inhale 1 puff into the lungs every 6 (six) hours as needed for wheezing or shortness of breath.  . beta carotene w/minerals (OCUVITE) tablet Take 1 tablet by mouth daily.  Marland Kitchen buPROPion (WELLBUTRIN XL) 150 MG 24 hr tablet Take 150 mg by mouth every morning.   . Calcium Carbonate-Vitamin D 600-125 MG-UNIT TABS Take 1 tablet by mouth daily.   . citalopram (CELEXA) 40 MG tablet Take 40 mg by mouth every morning.   . clindamycin (CLEOCIN) 300 MG capsule Take 1,200 mg by mouth as directed. Only when going to the dentist, due to hardware in left leg  . clotrimazole-betamethasone (LOTRISONE) cream Apply topically 2 (two) times daily.  Marland Kitchen docusate sodium (COLACE) 100 MG capsule Take 100 mg by mouth daily.   . fesoterodine (TOVIAZ) 8 MG TB24 tablet Take 8 mg by mouth daily.  . Fish Oil OIL Take 1 tablet by mouth  daily.  Marland Kitchen GREEN COFFEE BEAN PO Take 1 tablet by mouth daily.   Marland Kitchen levothyroxine (SYNTHROID, LEVOTHROID) 175 MCG tablet Take 175 mcg by mouth every morning.   . Multiple Vitamins tablet Take 1 tablet by mouth.  . Multiple Vitamins-Minerals (WOMENS 50+ MULTI VITAMIN/MIN PO) Take 1 tablet by mouth daily.  . Omega-3 1000 MG CAPS Take 2 g by mouth.  Marland Kitchen OVER THE COUNTER MEDICATION Take 2 capsules by mouth daily. Raspberry keytone 125 mg  . oxyCODONE (OXY IR/ROXICODONE) 5 MG immediate release tablet   . oxyCODONE-acetaminophen (ROXICET) 5-325 MG per tablet Take 2 tablets by mouth every 6 (six) hours as needed for moderate pain or severe pain.  . pantoprazole (PROTONIX) 40 MG tablet Take 40 mg by mouth daily.   . Polyethylene Glycol 3350 (MIRALAX PO) Take 17 g by mouth at bedtime.   . polyvinyl alcohol (LIQUIFILM TEARS) 1.4 % ophthalmic solution Place 1 drop into both eyes as needed for dry eyes.  Marland Kitchen Raspberry Ketones 100 MG CAPS Take by mouth.  . spironolactone (ALDACTONE) 25 MG tablet Take 0.5-1 tablets (12.5-25 mg total) by mouth as  needed (for swelling).  Marland Kitchen sulfamethoxazole-trimethoprim (BACTRIM DS) 800-160 MG per tablet Take 1 tablet by mouth daily.    Allergy:  Allergies  Allergen Reactions  . Codeine     Hallucinations   . Demerol [Meperidine]     "Knocks me out"  . Diazepam Other (See Comments)    Knocks me out   . Tape     "thick clear plastic tape" causes blisters  . Morphine And Related Itching and Rash    Per CRNA, pt tol Fentanyl IV for OR case, no issues reported to rec RN in PACU, no immediate PACU issues noted as well upon arrival  . Penicillins Rash    Social Hx:   History   Social History  . Marital Status: Married    Spouse Name: N/A    Number of Children: N/A  . Years of Education: N/A   Occupational History  . Not on file.   Social History Main Topics  . Smoking status: Former Smoker    Types: Cigarettes    Quit date: 06/05/1999  . Smokeless tobacco: Never  Used  . Alcohol Use: No  . Drug Use: No  . Sexual Activity: No   Other Topics Concern  . Not on file   Social History Narrative  . No narrative on file    Past Surgical Hx:  Past Surgical History  Procedure Laterality Date  . Mastectomy  1997    right breast  . Cholecystectomy    . Knee surgery      x5  . Joint replacement      right knee x2  . Joint replacement      left knee x5  . Back surgery    . Foot surgery      fused left foot  . Tonsillectomy  68  . Hand tendon surgery      right hand tendon tear  . Rotator cuff repair      right rotator cuff  . Thyroidectomy    . Incisional hernia repair  05/05/2011    Procedure: LAPAROSCOPIC INCISIONAL HERNIA;  Surgeon: Edward Jolly, MD;  Location: [facility];  Service: General;  Laterality: N/A;  LAPAROSCOPIC REPAIR INCARCERATED INCISIONAL HERNIA  WITH MESH  . Hernia repair  05/05/11    emergency VH and incisional hernia repair   . Bladder suspension  10/13/2011    Procedure: TRANSVAGINAL TAPE (TVT) PROCEDURE;  Surgeon: Delice Lesch, MD;  Location: [facility];  Service: Gynecology;  Laterality: N/A;  . Cystoscopy  10/13/2011    Procedure: CYSTOSCOPY;  Surgeon: Delice Lesch, MD;  Location: [facility];  Service: Gynecology;  Laterality: Bilateral;  . Vulvectomy  05/27/2012    Procedure: WIDE EXCISION VULVECTOMY;  Surgeon: Imagene Gurney A. Alycia Rossetti, MD;  Location: [facility];  Service: Gynecology;  Laterality: N/A;  Wide Local Excision of Vulva   . Breast surgery    . Breast lumpectomy      LEFT BREAST-CA  . Vulvectomy N/A 11/23/2012    Procedure: WIDE LOCAL EXCISION OF THE VULVA;  Surgeon: Imagene Gurney A. Alycia Rossetti, MD;  Location: [facility];  Service: Gynecology;  Laterality: N/A;  left inferior vulvar biopsy excision left superior lesion left inferior vulvar excision  . Foot surgery Left 11/14    I&D left foot with woundvac placement  . Vulvectomy N/A 08/02/2013    Procedure: WIDE LOCAL  EXCISION VULVA;  Surgeon: Imagene Gurney A. Alycia Rossetti, MD;  Location: [facility];  Service: Gynecology;  Laterality: N/A;    Past  Medical Hx:  Past Medical History  Diagnosis Date  . Cancer   . Asthma   . Blood transfusion   . GERD (gastroesophageal reflux disease)   . Thyroid disease   . Allergy   . PONV (postoperative nausea and vomiting)   . Breast cancer, stage 2 05/28/2011  . 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 Hypothyroid 05/28/2011  . GERD (gastroesophageal reflux disease) 05/28/2011  . 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 Mild depression 05/28/2011  . Thrombocytopenia, unspecified 05/28/2011  . Hx of migraines   . Right rib fracture 10/26/12    PT FELL AND CXR SHOWED RIGHT LATERAL 8th OR 9th RIB FRACTURE.   PT STATES SORENESS RESOLVED - AND BREATHING GOOD  . Macular degeneration of right eye   . Seasonal allergies   . Fibromyalgia 05/28/2011    07/28/13   pt denies    Family Hx:  Family History  Problem Relation Age of Onset  . Cancer Father 50    lung and kidney   . Alzheimer's disease Mother   . Alcohol abuse Brother   . Heart attack Maternal Grandmother     Vitals:  Blood pressure 154/72, pulse 81, temperature 98.4 F (36.9 C), temperature source Oral, resp. rate 20, weight 243 lb (110.224 kg).  Physical Exam: Well-nourished well-developed female in no acute distress.  Pelvic: External genitalia status post excision of the left labia. The area is well-healed. Posterior fourchette is well-healed. Assessment/Plan: 48 -year-old with recurrent vulvar Paget's disease. She's having right-sided pain that appears to be related to voiding. She underwent a urologic procedure about 2 years ago. Dr. Chauncey Cruel had to cancel his appointment with her. We will call their offices to see if she could be seen by another provider  Karenna Romanoff A. Alycia Rossetti, MD 09/07/2013, 12:03 PM

## 2013-09-08 ENCOUNTER — Telehealth: Payer: Self-pay | Admitting: Gynecologic Oncology

## 2013-09-08 NOTE — Telephone Encounter (Signed)
Spoke with the patient about her request to possibly see Dr. Alinda Money at [facility] to evaluate RLQ pain with urination.  Called this am to [facility] and spoke with an RN who stated she would call the patient to arrange for an appointment.  Advised to call the office if she had not heard from [facility].

## 2014-03-16 ENCOUNTER — Encounter: Payer: Self-pay | Admitting: Gynecologic Oncology

## 2014-03-16 ENCOUNTER — Ambulatory Visit: Payer: Medicare Other | Attending: Gynecologic Oncology | Admitting: Gynecologic Oncology

## 2014-03-16 VITALS — BP 141/68 | HR 74 | Temp 98.1°F | Resp 18 | Ht 65.0 in | Wt 243.0 lb

## 2014-03-16 DIAGNOSIS — C50919 Malignant neoplasm of unspecified site of unspecified female breast: Secondary | ICD-10-CM | POA: Diagnosis not present

## 2014-03-16 DIAGNOSIS — E039 Hypothyroidism, unspecified: Secondary | ICD-10-CM | POA: Insufficient documentation

## 2014-03-16 DIAGNOSIS — R1031 Right lower quadrant pain: Secondary | ICD-10-CM | POA: Insufficient documentation

## 2014-03-16 DIAGNOSIS — Z79899 Other long term (current) drug therapy: Secondary | ICD-10-CM | POA: Insufficient documentation

## 2014-03-16 DIAGNOSIS — C511 Malignant neoplasm of labium minus: Secondary | ICD-10-CM | POA: Insufficient documentation

## 2014-03-16 DIAGNOSIS — Z87891 Personal history of nicotine dependence: Secondary | ICD-10-CM | POA: Insufficient documentation

## 2014-03-16 DIAGNOSIS — R109 Unspecified abdominal pain: Secondary | ICD-10-CM

## 2014-03-16 DIAGNOSIS — C4499 Other specified malignant neoplasm of skin, unspecified: Secondary | ICD-10-CM

## 2014-03-16 NOTE — Patient Instructions (Signed)
We will call you with the results 

## 2014-03-16 NOTE — Progress Notes (Signed)
Consult Note: Gyn-Onc  [name] 68 y.o. female  CC:  Chief Complaint  Patient presents with  . 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 Some of the surgical margins were positive. Her postoperative course was uncomplicated. She was last seen by our service in 12/14. At that time there was no evidence of any lesions consistent with Paget's. However, there is considerable amount of excoriations throughout the entire vulvar and medial thighs consistent with chronic vulvitis.In December 2013 she had a hernia repair due to bowel obstruction by Dr. Excell Seltzer with permanent mesh. In May 2013 she also had a TVT bladder by Dr. Everett Graff. She does continue to wear a pad and also takes Detrol twice daily but states that her urinary leakage is markedly improved. She described the vulvar irritation is intermittent itching with burning and that it has increased over time as has the pruritus. She does use a steroid cream twice daily and does scratch her vulva.   She underwent a wide local excision of the left vulva on January 2 of year 2014. Operative findings included 2 separate 3 x 3 and 1.2 cm lesions consistent with Paget's on the left.  Pathology revealed 2 foci of extramammary Paget's disease extending to the left inked margin in the right and anterior tip margins at multiple foci. The posterior margin was negative for malignancy.   I performed a biopsy of this it was consistent with recurrent Paget's disease. On 11/23/2012 showed a repeat wide local excision of liver x2 with the posterior fourchette biopsy. Findings revealed along the left labia minora/vagina is a Paget's disease measuring approximately 2.5 x 1 cm in the superior aspect of her prior left vulvar excision. The inferior aspect of a similar lesion. 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 On the  left perineal body the area is somewhat excoriated pale. Biopsy was performed in this area.  Pathology revealed:  1. Vulva, biopsy, left inferior - EXTRAMAMMARY PAGET DISEASE EXTENDING TO THE EDGES OF THE BIOPSY. 2. Vulva, excision, left superior lesion suture marks 1200 - EXTRAMAMMARY PAGET DISEASE. - MARGINS NOT INVOLVED. - CLOSEST MARGIN 9 O'CLOCK, LESS THAN 0.1 CM. 3. Vulva, excision, left inferior excision suture marks 1200 - EXTRAMAMMARY PAGET DISEASE, 12 O'CLOCK MARGIN FOCALLY INVOLVED. - REMAINING MARGINS CLEAR.  We have discussed repeating surgery however she had multiple orthopedic issues had broken ankle. She was last seen by [name] in December 2014. Exam at that time was fairly unremarkable.  I had last seen her in January of 2015. At that time exam was concerning for Paget's.   On 08/02/2013 she underwent wide local excisions of the vulva x2.  Operative findings included pale pink raised lesion measuring 1 x 0.5 cm the left upper vulva near the prior excision site. There is a pale raised lesion measuring 1.5 x 1 cm on the left crossing midline perineal body.  Pathology revealed left superior vulvar excision with extramammary Paget's disease extending to the 9:00 margin. The other margins were negative for tumor. Of the left of midline lesion she extramammary Paget's disease extending to the 12:00 margin and focally at the 6:00 margin but otherwise negative margins. The patient at that time discussed with [place] that she was having some right lower quadrant pain. Her exam is very difficult secondary to habitus. There ultrasound was obtained. Ultrasound revealed:  FINDINGS:  UterusMeasurements: 4.6 x 3.5 x 3.8 cm. 2.5 x 1.7 x 1.6 cm uterine fibroid. This is present in the right lateral aspect of the body uterus.  Endometrium Thickness: 6.1 mm. No focal abnormality visualized.  Right ovary: No focal abnormality.  Left ovary :No focal abnormality.  Other findings: No free fluid   IMPRESSION:  Uterine fibroid otherwise negative exam.  She comes in today for follow up. I last saw her in 4/15. She has numerous complaints today. She is complaining of abdominal pain. She stay she has to hold her abdomen in order to make having a bowel movement use year. She states that her abdomen is bulging and its more than just because of her weight. She states that for the past 30 days it "hurts all the time". She stay she's up-to-date on her colonoscopy does not need one for another 3 years. She states that she is her MiraLAX Mr. softeners every day however she still needs to strain to have a bowel movement. She denies any blood in her stools. There's not been any change in her caliber. She complains of occasional nausea with no emesis. She states her bladder issues are better than the last time I saw her. She's down to using one pad a day. She continues to take a "fluid pill" 2 times per week. She had bronchitis in August. She is complaining of vulvar itching again. The going on for about 10 days. There's been no bleeding. She states that the skin feels daily and thick. She denies any vaginal bleeding. She has no other new medical problems.  Review of Systems  Constitutional:Denies fever. Skin: + itching Cardiovascular: No chest pain, shortness of breath Pulmonary: No cough Gastro Intestinal: Reporting intermittent lower abdominal soreness.  No nausea, vomiting, constipation, or diarrhea reported. No bright red blood per rectum or change in bowel movement.  Genitourinary:Denies vaginal bleeding and discharge.  Musculoskeletal: orthopedic issues continue Neurologic: uses walker Psychology: no changes   Current Meds:  Outpatient Encounter Prescriptions as of 03/16/2014  Medication Sig  . acetaminophen (TYLENOL) 500 MG tablet Take 500 mg by mouth every 6 (six) hours as needed for pain.  Marland Kitchen albuterol (PROVENTIL HFA;VENTOLIN HFA) 108 (90 BASE) MCG/ACT inhaler Inhale 1 puff into the lungs  every 6 (six) hours as needed for wheezing or shortness of breath.  . beta carotene w/minerals (OCUVITE) tablet Take 1 tablet by mouth daily.  Marland Kitchen buPROPion (WELLBUTRIN XL) 150 MG 24 hr tablet Take 150 mg by mouth every morning.   . Calcium Carbonate-Vitamin D 600-125 MG-UNIT TABS Take 1 tablet by mouth daily.   . citalopram (CELEXA) 40 MG tablet Take 40 mg by mouth every morning.   . clotrimazole-betamethasone (LOTRISONE) cream Apply topically 2 (two) times daily.  Marland Kitchen docusate sodium (COLACE) 100 MG capsule Take 100 mg by mouth daily.   . fesoterodine (TOVIAZ) 8 MG TB24 tablet Take 8 mg by mouth daily.  . fluticasone (FLOVENT HFA) 44 MCG/ACT inhaler Inhale 2 puffs into the lungs.  Marland Kitchen levothyroxine (SYNTHROID, LEVOTHROID) 175 MCG tablet Take 175 mcg by mouth every morning.   . Multiple Vitamins tablet Take 1 tablet by mouth.  . Multiple Vitamins-Minerals (WOMENS 50+ MULTI VITAMIN/MIN PO) Take 1 tablet by mouth daily.  . Omega-3 1000 MG CAPS Take 2 g by mouth.  . pantoprazole (PROTONIX) 40 MG tablet Take 40 mg by mouth daily.   . Polyethylene Glycol 3350 (MIRALAX PO) Take 17 g by mouth at bedtime.   . polyvinyl  alcohol (LIQUIFILM TEARS) 1.4 % ophthalmic solution Place 1 drop into both eyes as needed for dry eyes.  Marland Kitchen spironolactone (ALDACTONE) 25 MG tablet Take 0.5-1 tablets (12.5-25 mg total) by mouth as needed (for swelling).  . clindamycin (CLEOCIN) 300 MG capsule Take 1,200 mg by mouth as directed. Only when going to the dentist, due to hardware in left leg  . [DISCONTINUED] Fish Oil OIL Take 1 tablet by mouth daily.  . [DISCONTINUED] GREEN COFFEE BEAN PO Take 1 tablet by mouth daily.   . [DISCONTINUED] OVER THE COUNTER MEDICATION Take 2 capsules by mouth daily. Raspberry keytone 125 mg  . [DISCONTINUED] oxyCODONE (OXY IR/ROXICODONE) 5 MG immediate release tablet   . [DISCONTINUED] oxyCODONE (OXY IR/ROXICODONE) 5 MG immediate release tablet Take 5 mg by mouth.  . [DISCONTINUED]  oxyCODONE-acetaminophen (ROXICET) 5-325 MG per tablet Take 2 tablets by mouth every 6 (six) hours as needed for moderate pain or severe pain.  . [DISCONTINUED] Raspberry Ketones 100 MG CAPS Take by mouth.  . [DISCONTINUED] sulfamethoxazole-trimethoprim (BACTRIM DS) 800-160 MG per tablet Take 1 tablet by mouth daily.    Allergy:  Allergies  Allergen Reactions  . Codeine     Hallucinations   . Demerol [Meperidine]     "Knocks me out"  . Diazepam Other (See Comments)    Knocks me out   . Tape     "thick clear plastic tape" causes blisters  . Morphine And Related Itching and Rash    Per CRNA, pt tol Fentanyl IV for OR case, no issues reported to rec RN in PACU, no immediate PACU issues noted as well upon arrival  . Penicillins Rash    Social Hx:   History   Social History  . Marital Status: Married    Spouse Name: N/A    Number of Children: N/A  . Years of Education: N/A   Occupational History  . Not on file.   Social History Main Topics  . Smoking status: Former Smoker    Types: Cigarettes    Quit date: 06/05/1999  . Smokeless tobacco: Never Used  . Alcohol Use: No  . Drug Use: No  . Sexual Activity: No   Other Topics Concern  . Not on file   Social History Narrative  . No narrative on file    Past Surgical Hx:  Past Surgical History  Procedure Laterality Date  . Mastectomy  1997    right breast  . Cholecystectomy    . Knee surgery      x5  . Joint replacement      right knee x2  . Joint replacement      left knee x5  . Back surgery    . Foot surgery      fused left foot  . Tonsillectomy  68  . Hand tendon surgery      right hand tendon tear  . Rotator cuff repair      right rotator cuff  . Thyroidectomy    . Incisional hernia repair  05/05/2011    Procedure: LAPAROSCOPIC INCISIONAL HERNIA;  Surgeon: Edward Jolly, MD;  Location: [facility];  Service: General;  Laterality: N/A;  LAPAROSCOPIC REPAIR INCARCERATED INCISIONAL HERNIA  WITH MESH  .  Hernia repair  05/05/11    emergency VH and incisional hernia repair   . Bladder suspension  10/13/2011    Procedure: TRANSVAGINAL TAPE (TVT) PROCEDURE;  Surgeon: Delice Lesch, MD;  Location: [facility];  Service: Gynecology;  Laterality: N/A;  .  Cystoscopy  10/13/2011    Procedure: CYSTOSCOPY;  Surgeon: Delice Lesch, MD;  Location: [facility];  Service: Gynecology;  Laterality: Bilateral;  . Vulvectomy  05/27/2012    Procedure: WIDE EXCISION VULVECTOMY;  Surgeon: Imagene Gurney A. Alycia Rossetti, MD;  Location: [facility];  Service: Gynecology;  Laterality: N/A;  Wide Local Excision of Vulva   . Breast surgery    . Breast lumpectomy      LEFT BREAST-CA  . Vulvectomy N/A 11/23/2012    Procedure: WIDE LOCAL EXCISION OF THE VULVA;  Surgeon: Imagene Gurney A. Alycia Rossetti, MD;  Location: [facility];  Service: Gynecology;  Laterality: N/A;  left inferior vulvar biopsy excision left superior lesion left inferior vulvar excision  . Foot surgery Left 11/14    I&D left foot with woundvac placement  . Vulvectomy N/A 08/02/2013    Procedure: WIDE LOCAL  EXCISION VULVA;  Surgeon: Imagene Gurney A. Alycia Rossetti, MD;  Location: [facility];  Service: Gynecology;  Laterality: N/A;    Past Medical Hx:  Past Medical History  Diagnosis Date  . Cancer   . Asthma   . Blood transfusion   . GERD (gastroesophageal reflux disease)   . Thyroid disease   . Allergy   . PONV (postoperative nausea and vomiting)   . Breast cancer, stage 2 05/28/2011  . 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 Hypothyroid 05/28/2011  . GERD (gastroesophageal reflux disease) 05/28/2011  . 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 Mild depression 05/28/2011  . Thrombocytopenia, unspecified 05/28/2011  . Hx of migraines   . Right rib fracture 10/26/12    PT FELL AND CXR SHOWED RIGHT LATERAL 8th OR 9th RIB FRACTURE.   PT STATES SORENESS RESOLVED - AND BREATHING GOOD  . Macular degeneration of right eye   . Seasonal allergies   . Fibromyalgia 05/28/2011    07/28/13   pt  denies    Family Hx:  Family History  Problem Relation Age of Onset  . Cancer Father 33    lung and kidney   . Alzheimer's disease Mother   . Alcohol abuse Brother   . Heart attack Maternal Grandmother     Vitals:  Blood pressure 141/68, pulse 74, temperature 98.1 F (36.7 C), temperature source Oral, resp. rate 18, height 5\' 5"  (1.651 m), weight 243 lb (110.224 kg).  Physical Exam: Well-nourished well-developed female in no acute distress.  Abdomen: Morbidly obese. No palpable masses. Exam is limited by habitus. Diffusely tender. No rebound or guarding to  Pelvic: External genitalia status post excision of the left labia. There is a small portion of labia minora on her left side. Immediately under this is excoriation with thick white skin daily is difficult to ascertain if the hyperkeratosis secondary to scratching or Paget's. After obtaining the patient's verbal consent, the area was injected with 0.5 mL of 1% plain lidocaine. A biopsy using a 3 mm punch biopsy was performed without difficulty. Hemostasis was obtained using silver nitrate. There are no other visible lesions. Assessment/Plan: 43 -year-old with recurrent vulvar Paget's disease. We spent quite a bit time to try to figure out her abdominal pain was is really very nondescript. She was offered a CT scan is her exam is very limited due to habitus but she defers at this time. I believe she has recurrent Paget's. We will followup in results for biopsy and call her with the results.  [name] A., MD 03/16/2014, 12:12 PM

## 2014-03-21 ENCOUNTER — Telehealth: Payer: Self-pay | Admitting: Gynecologic Oncology

## 2014-03-24 ENCOUNTER — Encounter (HOSPITAL_BASED_OUTPATIENT_CLINIC_OR_DEPARTMENT_OTHER): Payer: Self-pay | Admitting: *Deleted

## 2014-03-27 ENCOUNTER — Encounter (HOSPITAL_BASED_OUTPATIENT_CLINIC_OR_DEPARTMENT_OTHER): Payer: Self-pay | Admitting: *Deleted

## 2014-03-27 NOTE — Progress Notes (Signed)
   03/27/14 1308  OBSTRUCTIVE SLEEP APNEA  Have you ever been diagnosed with sleep apnea through a sleep study? No  Do you snore loudly (loud enough to be heard through closed doors)?  1  Do you often feel tired, fatigued, or sleepy during the daytime? 0  Has anyone observed you stop breathing during your sleep? 0  Do you have, or are you being treated for high blood pressure? 1  BMI more than 35 kg/m2? 1  Age over 68 years old? 1  Gender: 0  Obstructive Sleep Apnea Score 4  Score 4 or greater  Results sent to PCP

## 2014-03-27 NOTE — Progress Notes (Signed)
NPO AFTER MN. ARRIVE AT 0700. NEEDS HG. WILL TAKE AM MEDS W/ SIPS OF WATER.

## 2014-03-29 ENCOUNTER — Ambulatory Visit (HOSPITAL_BASED_OUTPATIENT_CLINIC_OR_DEPARTMENT_OTHER): Payer: Medicare Other | Admitting: Anesthesiology

## 2014-03-29 ENCOUNTER — Encounter (HOSPITAL_BASED_OUTPATIENT_CLINIC_OR_DEPARTMENT_OTHER)
Admission: RE | Disposition: A | Discharge status: Home / Self Care | Payer: Self-pay | Source: Ambulatory Visit | Attending: Gynecologic Oncology

## 2014-03-29 ENCOUNTER — Ambulatory Visit (HOSPITAL_BASED_OUTPATIENT_CLINIC_OR_DEPARTMENT_OTHER)
Admission: RE | Admit: 2014-03-29 | Discharge: 2014-03-29 | Disposition: A | Discharge status: Home / Self Care | Payer: Medicare Other | Source: Ambulatory Visit | Attending: Gynecologic Oncology | Admitting: Gynecologic Oncology

## 2014-03-29 ENCOUNTER — Other Ambulatory Visit: Payer: Self-pay | Admitting: Gynecologic Oncology

## 2014-03-29 ENCOUNTER — Encounter (HOSPITAL_BASED_OUTPATIENT_CLINIC_OR_DEPARTMENT_OTHER): Payer: Self-pay | Admitting: *Deleted

## 2014-03-29 DIAGNOSIS — Z885 Allergy status to narcotic agent status: Secondary | ICD-10-CM | POA: Insufficient documentation

## 2014-03-29 DIAGNOSIS — Z6841 Body Mass Index (BMI) 40.0 and over, adult: Secondary | ICD-10-CM | POA: Insufficient documentation

## 2014-03-29 DIAGNOSIS — C519 Malignant neoplasm of vulva, unspecified: Secondary | ICD-10-CM | POA: Insufficient documentation

## 2014-03-29 DIAGNOSIS — M47896 Other spondylosis, lumbar region: Secondary | ICD-10-CM | POA: Insufficient documentation

## 2014-03-29 DIAGNOSIS — Z888 Allergy status to other drugs, medicaments and biological substances status: Secondary | ICD-10-CM | POA: Insufficient documentation

## 2014-03-29 DIAGNOSIS — Z87891 Personal history of nicotine dependence: Secondary | ICD-10-CM | POA: Diagnosis not present

## 2014-03-29 DIAGNOSIS — H3530 Unspecified macular degeneration: Secondary | ICD-10-CM | POA: Insufficient documentation

## 2014-03-29 DIAGNOSIS — Z9011 Acquired absence of right breast and nipple: Secondary | ICD-10-CM | POA: Diagnosis not present

## 2014-03-29 DIAGNOSIS — E039 Hypothyroidism, unspecified: Secondary | ICD-10-CM | POA: Diagnosis not present

## 2014-03-29 DIAGNOSIS — C4499 Other specified malignant neoplasm of skin, unspecified: Secondary | ICD-10-CM

## 2014-03-29 DIAGNOSIS — K219 Gastro-esophageal reflux disease without esophagitis: Secondary | ICD-10-CM | POA: Insufficient documentation

## 2014-03-29 DIAGNOSIS — J45909 Unspecified asthma, uncomplicated: Secondary | ICD-10-CM | POA: Diagnosis not present

## 2014-03-29 DIAGNOSIS — Z880 Allergy status to penicillin: Secondary | ICD-10-CM | POA: Insufficient documentation

## 2014-03-29 DIAGNOSIS — M797 Fibromyalgia: Secondary | ICD-10-CM | POA: Diagnosis not present

## 2014-03-29 HISTORY — DX: Unspecified osteoarthritis, unspecified site: M19.90

## 2014-03-29 HISTORY — DX: Anxiety disorder, unspecified: F41.9

## 2014-03-29 HISTORY — DX: Other disturbances of skin sensation: R20.8

## 2014-03-29 HISTORY — DX: Depression, unspecified: F32.A

## 2014-03-29 HISTORY — DX: Personal history of colonic polyps: Z86.010

## 2014-03-29 HISTORY — DX: Malignant neoplasm of vulva, unspecified: C51.9

## 2014-03-29 HISTORY — DX: Other constipation: K59.09

## 2014-03-29 HISTORY — DX: Hypothyroidism, unspecified: E03.9

## 2014-03-29 HISTORY — DX: Personal history of colon polyps, unspecified: Z86.0100

## 2014-03-29 HISTORY — DX: Personal history of other diseases of the digestive system: Z87.19

## 2014-03-29 HISTORY — DX: Other specified malignant neoplasm of skin, unspecified: C44.99

## 2014-03-29 HISTORY — DX: Other specified postprocedural states: Z98.890

## 2014-03-29 HISTORY — DX: Personal history of peptic ulcer disease: Z87.11

## 2014-03-29 HISTORY — DX: Personal history of malignant neoplasm of breast: Z85.3

## 2014-03-29 HISTORY — DX: Major depressive disorder, single episode, unspecified: F32.9

## 2014-03-29 HISTORY — DX: Stress incontinence (female) (male): N39.3

## 2014-03-29 HISTORY — DX: Presence of spectacles and contact lenses: Z97.3

## 2014-03-29 HISTORY — DX: Unspecified asthma, uncomplicated: J45.909

## 2014-03-29 HISTORY — PX: VULVECTOMY: SHX1086

## 2014-03-29 HISTORY — DX: Anesthesia of skin: R20.0

## 2014-03-29 LAB — POCT HEMOGLOBIN-HEMACUE: HEMOGLOBIN: 12.4 g/dL (ref 12.0–15.0)

## 2014-03-29 SURGERY — WIDE EXCISION VULVECTOMY
Anesthesia: General | Site: Vulva | Laterality: Left

## 2014-03-29 MED ORDER — KETOROLAC TROMETHAMINE 30 MG/ML IJ SOLN
INTRAMUSCULAR | Status: DC | PRN
  Administered 2014-03-29: 15 mg via INTRAVENOUS

## 2014-03-29 MED ORDER — ACETIC ACID 5 % SOLN
Status: DC | PRN
  Administered 2014-03-29: 1 via TOPICAL

## 2014-03-29 MED ORDER — KETOROLAC TROMETHAMINE 30 MG/ML IJ SOLN
15.0000 mg | Freq: Once | INTRAMUSCULAR | Status: DC | PRN
  Filled 2014-03-29: qty 1

## 2014-03-29 MED ORDER — OXYCODONE-ACETAMINOPHEN 5-325 MG PO TABS: 1.0000 | ORAL_TABLET | ORAL | Status: AC | PRN

## 2014-03-29 MED ORDER — DEXAMETHASONE SODIUM PHOSPHATE 4 MG/ML IJ SOLN
INTRAMUSCULAR | Status: DC | PRN
  Administered 2014-03-29: 10 mg via INTRAVENOUS

## 2014-03-29 MED ORDER — OXYCODONE-ACETAMINOPHEN 5-325 MG PO TABS
1.0000 | ORAL_TABLET | Freq: Four times a day (QID) | ORAL | Status: DC | PRN
  Administered 2014-03-29: 1 via ORAL
  Filled 2014-03-29: qty 2

## 2014-03-29 MED ORDER — OXYCODONE-ACETAMINOPHEN 5-325 MG PO TABS
ORAL_TABLET | ORAL | Status: AC
  Filled 2014-03-29: qty 1

## 2014-03-29 MED ORDER — OXYCODONE-ACETAMINOPHEN 5-325 MG PO TABS: 1.0000 | ORAL_TABLET | ORAL | Status: DC | PRN

## 2014-03-29 MED ORDER — FENTANYL CITRATE 0.05 MG/ML IJ SOLN
INTRAMUSCULAR | Status: DC | PRN
  Administered 2014-03-29 (×2): 50 ug via INTRAVENOUS

## 2014-03-29 MED ORDER — LACTATED RINGERS IV SOLN
INTRAVENOUS | Status: DC
  Administered 2014-03-29: 08:00:00 via INTRAVENOUS
  Filled 2014-03-29: qty 1000

## 2014-03-29 MED ORDER — ACETAMINOPHEN 10 MG/ML IV SOLN
INTRAVENOUS | Status: DC | PRN
  Administered 2014-03-29: 1000 mg via INTRAVENOUS

## 2014-03-29 MED ORDER — FENTANYL CITRATE 0.05 MG/ML IJ SOLN
INTRAMUSCULAR | Status: AC
  Filled 2014-03-29: qty 2

## 2014-03-29 MED ORDER — PROMETHAZINE HCL 25 MG/ML IJ SOLN
6.2500 mg | INTRAMUSCULAR | Status: DC | PRN
  Filled 2014-03-29: qty 1

## 2014-03-29 MED ORDER — PROPOFOL 10 MG/ML IV BOLUS
INTRAVENOUS | Status: DC | PRN
  Administered 2014-03-29: 180 mg via INTRAVENOUS

## 2014-03-29 MED ORDER — BUPIVACAINE HCL (PF) 0.5 % IJ SOLN
INTRAMUSCULAR | Status: DC | PRN
  Administered 2014-03-29: 13 mL

## 2014-03-29 MED ORDER — FENTANYL CITRATE 0.05 MG/ML IJ SOLN
25.0000 ug | INTRAMUSCULAR | Status: DC | PRN
  Administered 2014-03-29: 50 ug via INTRAVENOUS
  Filled 2014-03-29: qty 1

## 2014-03-29 MED ORDER — LIDOCAINE HCL (CARDIAC) 20 MG/ML IV SOLN
INTRAVENOUS | Status: DC | PRN
  Administered 2014-03-29: 50 mg via INTRAVENOUS

## 2014-03-29 MED ORDER — ONDANSETRON HCL 4 MG/2ML IJ SOLN
INTRAMUSCULAR | Status: DC | PRN
  Administered 2014-03-29: 4 mg via INTRAVENOUS

## 2014-03-29 MED ORDER — STERILE WATER FOR IRRIGATION IR SOLN
Status: DC | PRN
  Administered 2014-03-29: 500 mL

## 2014-03-29 MED ORDER — FENTANYL CITRATE 0.05 MG/ML IJ SOLN
INTRAMUSCULAR | Status: AC
  Filled 2014-03-29: qty 4

## 2014-03-29 MED ORDER — SUCCINYLCHOLINE CHLORIDE 20 MG/ML IJ SOLN
INTRAMUSCULAR | Status: DC | PRN
  Administered 2014-03-29: 140 mg via INTRAVENOUS

## 2014-03-29 MED ORDER — MIDAZOLAM HCL 2 MG/2ML IJ SOLN
INTRAMUSCULAR | Status: AC
  Filled 2014-03-29: qty 2

## 2014-03-29 SURGICAL SUPPLY — 40 items
APPLICATOR COTTON TIP 6IN STRL (MISCELLANEOUS) ×4 IMPLANT
BLADE SURG 15 STRL LF DISP TIS (BLADE) ×2 IMPLANT
BLADE SURG 15 STRL SS (BLADE) ×4
CANISTER SUCTION 1200CC (MISCELLANEOUS) IMPLANT
CANISTER SUCTION 2500CC (MISCELLANEOUS) IMPLANT
CATH ROBINSON RED A/P 16FR (CATHETERS) IMPLANT
CLOTH BEACON ORANGE TIMEOUT ST (SAFETY) ×2 IMPLANT
COVER TABLE BACK 60X90 (DRAPES) ×2 IMPLANT
DRAPE LG THREE QUARTER DISP (DRAPES) ×2 IMPLANT
DRAPE UNDERBUTTOCKS STRL (DRAPE) ×2 IMPLANT
DRSG TELFA 3X8 NADH (GAUZE/BANDAGES/DRESSINGS) IMPLANT
ELECT REM PT RETURN 9FT ADLT (ELECTROSURGICAL) ×2
ELECTRODE REM PT RTRN 9FT ADLT (ELECTROSURGICAL) ×1 IMPLANT
GLOVE BIO SURGEON STRL SZ 6.5 (GLOVE) ×4 IMPLANT
GLOVE INDICATOR 7.0 STRL GRN (GLOVE) ×2 IMPLANT
GLOVE INDICATOR 7.5 STRL GRN (GLOVE) ×1 IMPLANT
GLOVE SURG SS PI 7.5 STRL IVOR (GLOVE) ×2 IMPLANT
GOWN PREVENTION PLUS LG XLONG (DISPOSABLE) ×2 IMPLANT
LEGGING LITHOTOMY PAIR STRL (DRAPES) ×4 IMPLANT
NDL HYPO 25X1 1.5 SAFETY (NEEDLE) IMPLANT
NEEDLE HYPO 25X1 1.5 SAFETY (NEEDLE) IMPLANT
NS IRRIG 500ML POUR BTL (IV SOLUTION) IMPLANT
PACK BASIN DAY SURGERY FS (CUSTOM PROCEDURE TRAY) ×2 IMPLANT
PAD DRESSING TELFA 3X8 NADH (GAUZE/BANDAGES/DRESSINGS) IMPLANT
PAD OB MATERNITY 4.3X12.25 (PERSONAL CARE ITEMS) ×2 IMPLANT
PAD PREP 24X48 CUFFED NSTRL (MISCELLANEOUS) ×2 IMPLANT
PENCIL BUTTON HOLSTER BLD 10FT (ELECTRODE) IMPLANT
SCOPETTES 8  STERILE (MISCELLANEOUS) ×2
SCOPETTES 8 STERILE (MISCELLANEOUS) ×2 IMPLANT
SUT VIC AB 2-0 SH 27 (SUTURE) ×4
SUT VIC AB 2-0 SH 27XBRD (SUTURE) ×2 IMPLANT
SUT VIC AB 3-0 PS2 18 (SUTURE)
SUT VIC AB 3-0 PS2 18XBRD (SUTURE) IMPLANT
SUT VIC AB 3-0 SH 27 (SUTURE) ×4
SUT VIC AB 3-0 SH 27X BRD (SUTURE) IMPLANT
TOWEL OR 17X24 6PK STRL BLUE (TOWEL DISPOSABLE) ×4 IMPLANT
TRAY DSU PREP LF (CUSTOM PROCEDURE TRAY) ×2 IMPLANT
TUBE CONNECTING 12X1/4 (SUCTIONS) ×2 IMPLANT
WATER STERILE IRR 500ML POUR (IV SOLUTION) ×2 IMPLANT
YANKAUER SUCT BULB TIP NO VENT (SUCTIONS) ×2 IMPLANT

## 2014-03-29 NOTE — Anesthesia Procedure Notes (Signed)
Procedure Name: Intubation Date/Time: 03/29/2014 8:32 AM Performed by: Bethena Roys T Pre-anesthesia Checklist: Patient identified, Emergency Drugs available, Suction available and Patient being monitored Patient Re-evaluated:Patient Re-evaluated prior to inductionOxygen Delivery Method: Circle System Utilized Preoxygenation: Pre-oxygenation with 100% oxygen Intubation Type: IV induction Ventilation: Mask ventilation without difficulty Laryngoscope Size: Mac and 3 Grade View: Grade II Tube type: Oral Number of attempts: 1 Airway Equipment and Method: stylet and oral airway Placement Confirmation: ETT inserted through vocal cords under direct vision,  positive ETCO2 and breath sounds checked- equal and bilateral Secured at: 21 cm Tube secured with: Tape Dental Injury: Teeth and Oropharynx as per pre-operative assessment

## 2014-03-29 NOTE — Transfer of Care (Signed)
Immediate Anesthesia Transfer of Care Note  Patient: [name]  Procedure(s) Performed: Procedure(s): WIDE LOCAL EXCISION OF LEFT VULVA  (Left)  Patient Location: PACU  Anesthesia Type:General  Level of Consciousness: awake, alert  and oriented  Airway & Oxygen Therapy: Patient Spontanous Breathing and Patient connected to nasal cannula oxygen  Post-op Assessment: Report given to PACU RN  Post vital signs: Reviewed and stable  Complications: No apparent anesthesia complications

## 2014-03-29 NOTE — H&P (Signed)
CC:  Chief Complaint  Patient presents with  . 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 Some of the surgical margins were positive. Her postoperative course was uncomplicated. She was last seen by our service in 12/14. At that time there was no evidence of any lesions consistent with Paget's. However, there is considerable amount of excoriations throughout the entire vulvar and medial thighs consistent with chronic vulvitis.In December 2013 she had a hernia repair due to bowel obstruction by Dr. Excell Seltzer with permanent mesh. In May 2013 she also had a TVT bladder by Dr. Everett Graff. She does continue to wear a pad and also takes Detrol twice daily but states that her urinary leakage is markedly improved. She described the vulvar irritation is intermittent itching with burning and that it has increased over time as has the pruritus. She does use a steroid cream twice daily and does scratch her vulva.   She underwent a wide local excision of the left vulva on January 2 of year 2014. Operative findings included 2 separate 3 x 3 and 1.2 cm lesions consistent with Paget's on the left.  Pathology revealed 2 foci of extramammary Paget's disease extending to the left inked margin in the right and anterior tip margins at multiple foci. The posterior margin was negative for malignancy.   I performed a biopsy of this it was consistent with recurrent Paget's disease. On 11/23/2012 showed a repeat wide local excision of liver x2 with the posterior fourchette biopsy. Findings revealed along the left labia minora/vagina is a Paget's disease measuring approximately 2.5 x 1 cm in the superior aspect of her prior left vulvar excision. The inferior aspect of a similar lesion. 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 On the left perineal body the area is somewhat excoriated  pale. Biopsy was performed in this area.  Pathology revealed:  1. Vulva, biopsy, left inferior - EXTRAMAMMARY PAGET DISEASE EXTENDING TO THE EDGES OF THE BIOPSY. 2. Vulva, excision, left superior lesion suture marks 1200 - EXTRAMAMMARY PAGET DISEASE. - MARGINS NOT INVOLVED. - CLOSEST MARGIN 9 O'CLOCK, LESS THAN 0.1 CM. 3. Vulva, excision, left inferior excision suture marks 1200 - EXTRAMAMMARY PAGET DISEASE, 12 O'CLOCK MARGIN FOCALLY INVOLVED. - REMAINING MARGINS CLEAR.  We have discussed repeating surgery however she had multiple orthopedic issues had broken ankle. She was last seen by [name] in December 2014. Exam at that time was fairly unremarkable.  I had last seen her in January of 2015. At that time exam was concerning for Paget's.   On 08/02/2013 she underwent wide local excisions of the vulva x2.  Operative findings included pale pink raised lesion measuring 1 x 0.5 cm the left upper vulva near the prior excision site. There is a pale raised lesion measuring 1.5 x 1 cm on the left crossing midline perineal body.  Pathology revealed left superior vulvar excision with extramammary Paget's disease extending to the 9:00 margin. The other margins were negative for tumor. Of the left of midline lesion she extramammary Paget's disease extending to the 12:00 margin and focally at the 6:00 margin but otherwise negative margins. The patient at that time discussed with [place] that she was having some right lower quadrant pain. Her exam is very difficult secondary to habitus. There ultrasound was obtained. Ultrasound revealed:  FINDINGS:  UterusMeasurements: 4.6 x 3.5 x 3.8 cm. 2.5  x 1.7 x 1.6 cm uterine fibroid. This is present in the right lateral aspect of the body uterus.  Endometrium Thickness: 6.1 mm. No focal abnormality visualized.  Right ovary: No focal abnormality.  Left ovary :No focal abnormality.  Other findings: No free fluid  IMPRESSION:  Uterine fibroid otherwise  negative exam.  She comes in today for follow up. I last saw her in 4/15. She has numerous complaints today. She is complaining of abdominal pain. She stay she has to hold her abdomen in order to make having a bowel movement use year. She states that her abdomen is bulging and its more than just because of her weight. She states that for the past 30 days it "hurts all the time". She stay she's up-to-date on her colonoscopy does not need one for another 3 years. She states that she is her MiraLAX Mr. softeners every day however she still needs to strain to have a bowel movement. She denies any blood in her stools. There's not been any change in her caliber. She complains of occasional nausea with no emesis. She states her bladder issues are better than the last time I saw her. She's down to using one pad a day. She continues to take a "fluid pill" 2 times per week. She had bronchitis in August. She is complaining of vulvar itching again. The going on for about 10 days. There's been no bleeding. She states that the skin feels daily and thick. She denies any vaginal bleeding. She has no other new medical problems.  Review of Systems  Constitutional:Denies fever. Skin: + itching Cardiovascular: No chest pain, shortness of breath Pulmonary: No cough Gastro Intestinal: Reporting intermittent lower abdominal soreness. No nausea, vomiting, constipation, or diarrhea reported. No bright red blood per rectum or change in bowel movement.  Genitourinary:Denies vaginal bleeding and discharge.  Musculoskeletal: orthopedic issues continue Neurologic: uses walker Psychology: no changes   Current Meds:  Outpatient Encounter Prescriptions as of 03/16/2014  Medication Sig  . acetaminophen (TYLENOL) 500 MG tablet Take 500 mg by mouth every 6 (six) hours as needed for pain.  Marland Kitchen albuterol (PROVENTIL HFA;VENTOLIN HFA) 108 (90 BASE) MCG/ACT inhaler Inhale 1 puff into the lungs every 6 (six) hours as needed  for wheezing or shortness of breath.  . beta carotene w/minerals (OCUVITE) tablet Take 1 tablet by mouth daily.  Marland Kitchen buPROPion (WELLBUTRIN XL) 150 MG 24 hr tablet Take 150 mg by mouth every morning.   . Calcium Carbonate-Vitamin D 600-125 MG-UNIT TABS Take 1 tablet by mouth daily.   . citalopram (CELEXA) 40 MG tablet Take 40 mg by mouth every morning.   . clotrimazole-betamethasone (LOTRISONE) cream Apply topically 2 (two) times daily.  Marland Kitchen docusate sodium (COLACE) 100 MG capsule Take 100 mg by mouth daily.   . fesoterodine (TOVIAZ) 8 MG TB24 tablet Take 8 mg by mouth daily.  . fluticasone (FLOVENT HFA) 44 MCG/ACT inhaler Inhale 2 puffs into the lungs.  Marland Kitchen levothyroxine (SYNTHROID, LEVOTHROID) 175 MCG tablet Take 175 mcg by mouth every morning.   . Multiple Vitamins tablet Take 1 tablet by mouth.  . Multiple Vitamins-Minerals (WOMENS 50+ MULTI VITAMIN/MIN PO) Take 1 tablet by mouth daily.  . Omega-3 1000 MG CAPS Take 2 g by mouth.  . pantoprazole (PROTONIX) 40 MG tablet Take 40 mg by mouth daily.   . Polyethylene Glycol 3350 (MIRALAX PO) Take 17 g by mouth at bedtime.   . polyvinyl alcohol (LIQUIFILM TEARS) 1.4 % ophthalmic solution Place 1 drop into  both eyes as needed for dry eyes.  Marland Kitchen spironolactone (ALDACTONE) 25 MG tablet Take 0.5-1 tablets (12.5-25 mg total) by mouth as needed (for swelling).  . clindamycin (CLEOCIN) 300 MG capsule Take 1,200 mg by mouth as directed. Only when going to the dentist, due to hardware in left leg  . [DISCONTINUED] Fish Oil OIL Take 1 tablet by mouth daily.  . [DISCONTINUED] GREEN COFFEE BEAN PO Take 1 tablet by mouth daily.   . [DISCONTINUED] OVER THE COUNTER MEDICATION Take 2 capsules by mouth daily. Raspberry keytone 125 mg  . [DISCONTINUED] oxyCODONE (OXY IR/ROXICODONE) 5 MG immediate release tablet   . [DISCONTINUED] oxyCODONE (OXY IR/ROXICODONE) 5 MG immediate release tablet Take 5 mg by mouth.   . [DISCONTINUED] oxyCODONE-acetaminophen (ROXICET) 5-325 MG per tablet Take 2 tablets by mouth every 6 (six) hours as needed for moderate pain or severe pain.  . [DISCONTINUED] Raspberry Ketones 100 MG CAPS Take by mouth.  . [DISCONTINUED] sulfamethoxazole-trimethoprim (BACTRIM DS) 800-160 MG per tablet Take 1 tablet by mouth daily.    Allergy:  Allergies  Allergen Reactions  . Codeine     Hallucinations   . Demerol [Meperidine]     "Knocks me out"  . Diazepam Other (See Comments)    Knocks me out   . Tape     "thick clear plastic tape" causes blisters  . Morphine And Related Itching and Rash    Per CRNA, pt tol Fentanyl IV for OR case, no issues reported to rec RN in PACU, no immediate PACU issues noted as well upon arrival  . Penicillins Rash    Social Hx:  History   Social History  . Marital Status: Married    Spouse Name: N/A    Number of Children: N/A  . Years of Education: N/A   Occupational History  . Not on file.   Social History Main Topics  . Smoking status: Former Smoker    Types: Cigarettes    Quit date: 06/05/1999  . Smokeless tobacco: Never Used  . Alcohol Use: No  . Drug Use: No  . Sexual Activity: No   Other Topics Concern  . Not on file   Social History Narrative  . No narrative on file    Past Surgical Hx:  Past Surgical History  Procedure Laterality Date  . Mastectomy  1997    right breast  . Cholecystectomy    . Knee surgery      x5  . Joint replacement      right knee x2  . Joint replacement      left knee x5  . Back surgery    . Foot surgery      fused left foot  . Tonsillectomy  68  . Hand tendon surgery      right hand tendon tear  . Rotator cuff repair      right rotator cuff  . Thyroidectomy    . Incisional hernia repair  05/05/2011     Procedure: LAPAROSCOPIC INCISIONAL HERNIA; Surgeon: Edward Jolly, MD; Location: [facility]; Service: General; Laterality: N/A; LAPAROSCOPIC REPAIR INCARCERATED INCISIONAL HERNIA WITH MESH  . Hernia repair  05/05/11    emergency VH and incisional hernia repair   . Bladder suspension  10/13/2011    Procedure: TRANSVAGINAL TAPE (TVT) PROCEDURE; Surgeon: Delice Lesch, MD; Location: [facility]; Service: Gynecology; Laterality: N/A;  . Cystoscopy  10/13/2011    Procedure: CYSTOSCOPY; Surgeon: Delice Lesch, MD; Location: [facility]; Service: Gynecology; Laterality: Bilateral;  .  Vulvectomy  05/27/2012    Procedure: WIDE EXCISION VULVECTOMY; Surgeon: Imagene Gurney A. Alycia Rossetti, MD; Location: [facility]; Service: Gynecology; Laterality: N/A; Wide Local Excision of Vulva   . Breast surgery    . Breast lumpectomy      LEFT BREAST-CA  . Vulvectomy N/A 11/23/2012    Procedure: WIDE LOCAL EXCISION OF THE VULVA; Surgeon: Imagene Gurney A. Alycia Rossetti, MD; Location: [facility]; Service: Gynecology; Laterality: N/A; left inferior vulvar biopsy excision left superior lesion left inferior vulvar excision  . Foot surgery Left 11/14    I&D left foot with woundvac placement  . Vulvectomy N/A 08/02/2013    Procedure: WIDE LOCAL EXCISION VULVA; Surgeon: Imagene Gurney A. Alycia Rossetti, MD; Location: [facility]; Service: Gynecology; Laterality: N/A;    Past Medical Hx:  Past Medical History  Diagnosis Date  . Cancer   . Asthma   . Blood transfusion   . GERD (gastroesophageal reflux disease)   . Thyroid disease   . Allergy   . PONV (postoperative nausea and vomiting)   . Breast cancer, stage 2 05/28/2011  . 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 Hypothyroid 05/28/2011  . GERD (gastroesophageal reflux disease) 05/28/2011  . 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 Mild depression 05/28/2011  .  Thrombocytopenia, unspecified 05/28/2011  . Hx of migraines   . Right rib fracture 10/26/12    PT FELL AND CXR SHOWED RIGHT LATERAL 8th OR 9th RIB FRACTURE. PT STATES SORENESS RESOLVED - AND BREATHING GOOD  . Macular degeneration of right eye   . Seasonal allergies   . Fibromyalgia 05/28/2011    07/28/13 pt denies    Family Hx:  Family History  Problem Relation Age of Onset  . Cancer Father 35    lung and kidney   . Alzheimer's disease Mother   . Alcohol abuse Brother   . Heart attack Maternal Grandmother     Vitals: Blood pressure 141/68, pulse 74, temperature 98.1 F (36.7 C), temperature source Oral, resp. rate 18, height 5\' 5"  (1.651 m), weight 243 lb (110.224 kg).  Physical Exam: Well-nourished well-developed female in no acute distress.  Abdomen: Morbidly obese. No palpable masses. Exam is limited by habitus. Diffusely tender. No rebound or guarding to  Pelvic: External genitalia status post excision of the left labia. There is a small portion of labia minora on her left side. Immediately under this is excoriation with thick white skin daily is difficult to ascertain if the hyperkeratosis secondary to scratching or Paget's. After obtaining the patient's verbal consent, the area was injected with 0.5 mL of 1% plain lidocaine. A biopsy using a 3 mm punch biopsy was performed without difficulty. Hemostasis was obtained using silver nitrate. There are no other visible lesions.  Assessment/Plan: 52 -year-old with recurrent vulvar Paget's disease. We spent quite a bit time to try to figure out her abdominal pain was is really very nondescript. She was offered a CT scan is her exam is very limited due to habitus but she defers at this time. I believe she has recurrent Paget's and biopsy confirms. She will undergo WLE in the OR as we have done before.

## 2014-03-29 NOTE — Anesthesia Preprocedure Evaluation (Signed)
Anesthesia Evaluation  Patient identified by MRN, date of birth, ID band Patient awake    Reviewed: Allergy & Precautions, H&P , NPO status , Patient's Chart, lab work & pertinent test results  Airway Mallampati: II  TM Distance: >3 FB Neck ROM: Full    Dental no notable dental hx. (+) Missing, Loose, Poor Dentition   Pulmonary asthma , former smoker,  breath sounds clear to auscultation  Pulmonary exam normal       Cardiovascular negative cardio ROS  Rhythm:Regular Rate:Normal     Neuro/Psych negative neurological ROS  negative psych ROS   GI/Hepatic Neg liver ROS, GERD-  Poorly Controlled,  Endo/Other  Hypothyroidism Morbid obesity  Renal/GU negative Renal ROS  negative genitourinary   Musculoskeletal negative musculoskeletal ROS (+)   Abdominal   Peds negative pediatric ROS (+)  Hematology negative hematology ROS (+)   Anesthesia Other Findings   Reproductive/Obstetrics negative OB ROS                             Anesthesia Physical Anesthesia Plan  ASA: III  Anesthesia Plan: General   Post-op Pain Management:    Induction: Intravenous and Rapid sequence  Airway Management Planned: Oral ETT  Additional Equipment:   Intra-op Plan:   Post-operative Plan: Extubation in OR  Informed Consent: I have reviewed the patients History and Physical, chart, labs and discussed the procedure including the risks, benefits and alternatives for the proposed anesthesia with the patient or authorized representative who has indicated his/her understanding and acceptance.   Dental advisory given  Plan Discussed with: CRNA and Surgeon  Anesthesia Plan Comments:         Anesthesia Quick Evaluation

## 2014-03-29 NOTE — Interval H&P Note (Signed)
History and Physical Interval Note:  03/29/2014 8:06 AM  [name]  has presented today for surgery, with the diagnosis of PAGETS DISEASE OF VULVA  The various methods of treatment have been discussed with the patient and family. After consideration of risks, benefits and other options for treatment, the patient has consented to  Procedure(s): WIDE LOCAL EXCISION OF LEFT VULVA  (Left) as a surgical intervention .  The patient's history has been reviewed, patient examined, no change in status, stable for surgery.  I have reviewed the patient's chart and labs.  Questions were answered to the patient's satisfaction.     [place] A.

## 2014-03-29 NOTE — Op Note (Signed)
PATIENT: [name] DATE OF BIRTH: [birth year] ENCOUNTER DATE: 03/29/14   Preop Diagnosis: Paget's disease of the vulva  Postoperative Diagnosis: Same  Surgery: left partial vulvectomy  Surgeons:  Imagene Gurney A. Alycia Rossetti, MD  Anesthesia: General, 13 mL 1/2% marcaine  Anesthesiologist: Christian Mate CRNA, Rose, MD  Estimated blood loss: 10 ml  Complications: None   Pathology: Left vulva marked at 12:00  Operative findings: White raised lesion measuring about 1 cm on left vulva just below the labia minora  Procedure: The patient was identified in the preoperative holding area. Informed consent was signed on the chart. Patient was seen history was reviewed and exam was performed.   The patient was then taken to the operating room and placed in the supine position with SCD hose on. General anesthesia was then induced without difficulty. She was then placed in the dorsolithotomy position. The perineum was prepped with betadine. The patient was then draped after the prep was dried. Timeout was performed the patient, procedure, antibiotic, allergy, and length of procedure.   The area was identified after applying dilute acetic acid. Local was injected. An elliptical incision was made with the knife to mark a visible negative margin around the lesion. This required the resection of a small amount of the labia minora on the left. The specimen was removed at the level of the subcutaneous tissues. Hemostasis was obtained with cautery. The deep tissues were reapproximated with interrupted 2.0 vicryl. The skin was closed with interrupted 3.0 vicryl sutures.  All instrument, suture, Ray-Tec, and needle counts were correct x2. The patient tolerated the procedure well and was taken recovery room in stable condition. This is [name] dictating an operative note on [name].

## 2014-03-29 NOTE — Anesthesia Postprocedure Evaluation (Signed)
  Anesthesia Post-op Note  Patient: [name]  Procedure(s) Performed: Procedure(s) (LRB): WIDE LOCAL EXCISION OF LEFT VULVA  (Left)  Patient Location: PACU  Anesthesia Type: General  Level of Consciousness: awake and alert   Airway and Oxygen Therapy: Patient Spontanous Breathing  Post-op Pain: mild  Post-op Assessment: Post-op Vital signs reviewed, Patient's Cardiovascular Status Stable, Respiratory Function Stable, Patent Airway and No signs of Nausea or vomiting  Last Vitals:  Filed Vitals:   03/29/14 0938  BP:   Pulse: 63  Temp:   Resp: 11    Post-op Vital Signs: stable   Complications: No apparent anesthesia complications

## 2014-03-29 NOTE — Discharge Instructions (Signed)
°  Post Anesthesia Home Care Instructions  Activity: Get plenty of rest for the remainder of the day. A responsible adult should stay with you for 24 hours following the procedure.  For the next 24 hours, DO NOT: -Drive a car -Paediatric nurse -Drink alcoholic beverages -Take any medication unless instructed by your physician -Make any legal decisions or sign important papers.  Meals: Start with liquid foods such as gelatin or soup. Progress to regular foods as tolerated. Avoid greasy, spicy, heavy foods. If nausea and/or vomiting occur, drink only clear liquids until the nausea and/or vomiting subsides. Call your physician if vomiting continues.  Special Instructions/Symptoms: Your throat may feel dry or sore from the anesthesia or the breathing tube placed in your throat during surgery. If this causes discomfort, gargle with warm salt water. The discomfort should disappear within 24 hours. Call your surgeon if you experience:   1.  Fever over 101.0. 2.  Inability to urinate. 3.  Nausea and/or vomiting. 4.  Extreme swelling or bruising at the surgical site. 5.  Continued bleeding from the incision. 6.  Increased pain, redness or drainage from the incision. 7.  Problems related to your pain medication. 9. Any problems and/or concerns

## 2014-03-30 ENCOUNTER — Encounter (HOSPITAL_BASED_OUTPATIENT_CLINIC_OR_DEPARTMENT_OTHER): Payer: Self-pay | Admitting: Gynecologic Oncology

## 2014-04-12 NOTE — Telephone Encounter (Signed)
Patient informed of biopsy results and Dr. Elenora Gamma recommendations for [facility].  Procedure arranged for 03/29/14.  Advised to call for any questions or concerns.

## 2014-04-13 ENCOUNTER — Other Ambulatory Visit: Payer: Self-pay | Admitting: Gynecologic Oncology

## 2014-04-13 ENCOUNTER — Encounter: Payer: Self-pay | Admitting: Gynecologic Oncology

## 2014-04-13 ENCOUNTER — Ambulatory Visit: Payer: Medicare Other | Attending: Gynecologic Oncology | Admitting: Gynecologic Oncology

## 2014-04-13 ENCOUNTER — Ambulatory Visit: Payer: Medicare Other

## 2014-04-13 VITALS — BP 153/75 | HR 88 | Temp 98.2°F | Resp 16 | Ht 65.0 in | Wt 253.9 lb

## 2014-04-13 DIAGNOSIS — E039 Hypothyroidism, unspecified: Secondary | ICD-10-CM | POA: Insufficient documentation

## 2014-04-13 DIAGNOSIS — F329 Major depressive disorder, single episode, unspecified: Secondary | ICD-10-CM | POA: Diagnosis not present

## 2014-04-13 DIAGNOSIS — C4499 Other specified malignant neoplasm of skin, unspecified: Secondary | ICD-10-CM

## 2014-04-13 DIAGNOSIS — J45909 Unspecified asthma, uncomplicated: Secondary | ICD-10-CM | POA: Diagnosis not present

## 2014-04-13 DIAGNOSIS — N393 Stress incontinence (female) (male): Secondary | ICD-10-CM | POA: Insufficient documentation

## 2014-04-13 DIAGNOSIS — Z79899 Other long term (current) drug therapy: Secondary | ICD-10-CM | POA: Insufficient documentation

## 2014-04-13 DIAGNOSIS — D071 Carcinoma in situ of vulva: Secondary | ICD-10-CM

## 2014-04-13 DIAGNOSIS — R1030 Lower abdominal pain, unspecified: Secondary | ICD-10-CM

## 2014-04-13 DIAGNOSIS — M797 Fibromyalgia: Secondary | ICD-10-CM | POA: Diagnosis not present

## 2014-04-13 DIAGNOSIS — Z87891 Personal history of nicotine dependence: Secondary | ICD-10-CM | POA: Diagnosis not present

## 2014-04-13 DIAGNOSIS — H3530 Unspecified macular degeneration: Secondary | ICD-10-CM | POA: Insufficient documentation

## 2014-04-13 DIAGNOSIS — F419 Anxiety disorder, unspecified: Secondary | ICD-10-CM | POA: Insufficient documentation

## 2014-04-13 DIAGNOSIS — C519 Malignant neoplasm of vulva, unspecified: Secondary | ICD-10-CM | POA: Insufficient documentation

## 2014-04-13 DIAGNOSIS — Z9010 Acquired absence of unspecified breast and nipple: Secondary | ICD-10-CM | POA: Insufficient documentation

## 2014-04-13 DIAGNOSIS — R109 Unspecified abdominal pain: Secondary | ICD-10-CM | POA: Insufficient documentation

## 2014-04-13 DIAGNOSIS — K219 Gastro-esophageal reflux disease without esophagitis: Secondary | ICD-10-CM | POA: Diagnosis not present

## 2014-04-13 LAB — BUN AND CREATININE (CC13)
BUN: 12.6 mg/dL (ref 7.0–26.0)
CREATININE: 0.7 mg/dL (ref 0.6–1.1)

## 2014-04-13 NOTE — Addendum Note (Signed)
Addended by: [name] D on: 04/13/2014 10:41 AM   Modules accepted: Orders

## 2014-04-13 NOTE — Progress Notes (Signed)
Consult Note: Gyn-Onc  [name] 68 y.o. female  CC:  Chief Complaint  Patient presents with  . 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 Some of the surgical margins were positive. Her postoperative course was uncomplicated. She was last seen by our service in 12/14. At that time there was no evidence of any lesions consistent with Paget's. However, there is considerable amount of excoriations throughout the entire vulvar and medial thighs consistent with chronic vulvitis.In December 2013 she had a hernia repair due to bowel obstruction by Dr. Excell Seltzer with permanent mesh. In May 2013 she also had a TVT bladder by Dr. Everett Graff. She does continue to wear a pad and also takes Detrol twice daily but states that her urinary leakage is markedly improved. She described the vulvar irritation is intermittent itching with burning and that it has increased over time as has the pruritus. She does use a steroid cream twice daily and does scratch her vulva.   She underwent a wide local excision of the left vulva on January 2 of year 2014. Operative findings included 2 separate 3 x 3 and 1.2 cm lesions consistent with Paget's on the left.  Pathology revealed 2 foci of extramammary Paget's disease extending to the left inked margin in the right and anterior tip margins at multiple foci. The posterior margin was negative for malignancy.   I performed a biopsy of this it was consistent with recurrent Paget's disease. On 11/23/2012 showed a repeat wide local excision of liver x2 with the posterior fourchette biopsy. Findings revealed along the left labia minora/vagina is a Paget's disease measuring approximately 2.5 x 1 cm in the superior aspect of her prior left vulvar excision. The inferior aspect of a similar lesion. 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 On  the left perineal body the area is somewhat excoriated pale. Biopsy was performed in this area.  Pathology revealed:  1. Vulva, biopsy, left inferior - EXTRAMAMMARY PAGET DISEASE EXTENDING TO THE EDGES OF THE BIOPSY. 2. Vulva, excision, left superior lesion suture marks 1200 - EXTRAMAMMARY PAGET DISEASE. - MARGINS NOT INVOLVED. - CLOSEST MARGIN 9 O'CLOCK, LESS THAN 0.1 CM. 3. Vulva, excision, left inferior excision suture marks 1200 - EXTRAMAMMARY PAGET DISEASE, 12 O'CLOCK MARGIN FOCALLY INVOLVED. - REMAINING MARGINS CLEAR.  We have discussed repeating surgery however she had multiple orthopedic issues had broken ankle. She was last seen by [name] in December 2014. Exam at that time was fairly unremarkable.  I had last seen her in January of 2015. At that time exam was concerning for Paget's.   On 08/02/2013 she underwent wide local excisions of the vulva x2.  Operative findings included pale pink raised lesion measuring 1 x 0.5 cm the left upper vulva near the prior excision site. There is a pale raised lesion measuring 1.5 x 1 cm on the left crossing midline perineal body.  Pathology revealed left superior vulvar excision with extramammary Paget's disease extending to the 9:00 margin. The other margins were negative for tumor. Of the left of midline lesion she extramammary Paget's disease extending to the 12:00 margin and focally at the 6:00 margin but otherwise negative margins. The patient at that time discussed with [place] that she was having some right lower quadrant pain. Her exam is very difficult secondary to habitus. There ultrasound was obtained. Ultrasound revealed:  FINDINGS:  UterusMeasurements: 4.6 x 3.5 x 3.8 cm. 2.5 x 1.7 x 1.6 cm uterine fibroid. This is present in the right lateral aspect of the body uterus.  Endometrium Thickness: 6.1 mm. No focal abnormality visualized.  Right ovary: No focal abnormality.  Left ovary :No focal abnormality.  Other findings: No free  fluid  IMPRESSION:  Uterine fibroid otherwise negative exam.  INTERVAL HISTORY:  I saw her March 16, 2014 at which time there was an area on the left vulva with hyperkeratosis. A biopsy of that area was performed consistent with Paget's disease. On March 29, 2014 she underwent wide local excision of the vulva. On exam she had a 1 cm left vulvar lesion just below the left labia minora. She underwent a wide local excision. He received a 3.7 x 1.7 x 0.3 cm lesion. There was extramammary Paget's disease extending to the 6 to 9:00 margin in the 12:00 margin. This is despite grossly negative margins on physical examination. She comes in today for her postoperative check. She's overall doing well. She is complaining of persistent and worrisome lower abdominal pain is radiating to her back. We've previously discussed this. Her ultrasound was negative. She declined a CT but now wishes to proceed with one. She did have some spotting for about a week. She's complaining of vulvar itching.  Current Meds:  Outpatient Encounter Prescriptions as of 04/13/2014  Medication Sig  . acetaminophen (TYLENOL) 500 MG tablet Take 500 mg by mouth every 6 (six) hours as needed for pain.  Marland Kitchen albuterol (PROVENTIL HFA;VENTOLIN HFA) 108 (90 BASE) MCG/ACT inhaler Inhale 1 puff into the lungs every 6 (six) hours as needed for wheezing or shortness of breath.  . beta carotene w/minerals (OCUVITE) tablet Take 1 tablet by mouth daily.  Marland Kitchen buPROPion (WELLBUTRIN XL) 150 MG 24 hr tablet Take 150 mg by mouth every morning.   . Calcium Carbonate-Vitamin D 600-125 MG-UNIT TABS Take 1 tablet by mouth daily.   . citalopram (CELEXA) 40 MG tablet Take 40 mg by mouth every morning.   . clindamycin (CLEOCIN) 300 MG capsule Take 1,200 mg by mouth as directed. Only when going to the dentist, due to hardware in left leg  . clotrimazole-betamethasone (LOTRISONE) cream Apply topically 2 (two) times daily. (Patient taking differently: Apply  topically 2 (two) times daily as needed. )  . docusate sodium (COLACE) 100 MG capsule Take 100 mg by mouth daily.   Marland Kitchen levothyroxine (SYNTHROID, LEVOTHROID) 175 MCG tablet Take 175 mcg by mouth every morning.   . Multiple Vitamins-Minerals (MULTIVITAMIN GUMMIES ADULT) CHEW Chew 2 tablets by mouth daily.  . Omega-3 1000 MG CAPS Take 1 g by mouth daily.   . pantoprazole (PROTONIX) 40 MG tablet Take 40 mg by mouth every morning.   . Polyethylene Glycol 3350 (MIRALAX PO) Take 17 g by mouth at bedtime.   . polyvinyl alcohol (LIQUIFILM TEARS) 1.4 % ophthalmic solution Place 1 drop into both eyes as needed for dry eyes.  Marland Kitchen spironolactone (ALDACTONE) 25 MG tablet Take 0.5-1 tablets (12.5-25 mg total) by mouth as needed (for swelling).  . chlorpheniramine-HYDROcodone (TUSSIONEX) 10-8 MG/5ML LQCR Take by mouth as needed.   . fluticasone (FLOVENT HFA) 44 MCG/ACT inhaler Inhale 2 puffs into the lungs as needed.     Allergy:  Allergies  Allergen Reactions  . Codeine Other (See Comments)    Hyper/ Hallucinations   . Demerol [Meperidine] Other (See Comments)    "Knocks me out"  . Diazepam Other (See Comments)  Extreme sedation  . Tape Hives and Other (See Comments)    "thick clear plastic tape" causes blisters  . Morphine And Related Itching and Rash    Per CRNA, pt tol Fentanyl IV for OR case, no issues reported to rec RN in PACU, no immediate PACU issues noted as well upon arrival  . Penicillins Hives and Swelling    Social Hx:   History   Social History  . Marital Status: Married    Spouse Name: N/A    Number of Children: N/A  . Years of Education: N/A   Occupational History  . Not on file.   Social History Main Topics  . Smoking status: Former Smoker -- 2.00 packs/day for 41 years    Types: Cigarettes    Quit date: 06/05/1999  . Smokeless tobacco: Never Used  . Alcohol Use: No  . Drug Use: No  . Sexual Activity: Not on file   Other Topics Concern  . Not on file   Social  History Narrative    Past Surgical Hx:  Past Surgical History  Procedure Laterality Date  . Foot surgery Left 02-01-2009    TRIPLE ARTHRODESIS  . Rotator cuff repair Right   . Incisional hernia repair  05/05/2011    Procedure: LAPAROSCOPIC INCISIONAL HERNIA;  Surgeon: Edward Jolly, MD;  Location: [facility];  Service: General;  Laterality: N/A;  LAPAROSCOPIC REPAIR INCARCERATED INCISIONAL HERNIA  WITH MESH  . Bladder suspension  10/13/2011    Procedure: TRANSVAGINAL TAPE (TVT) PROCEDURE;  Surgeon: Delice Lesch, MD;  Location: [facility];  Service: Gynecology;  Laterality: N/A;  . Cystoscopy  10/13/2011    Procedure: CYSTOSCOPY;  Surgeon: Delice Lesch, MD;  Location: [facility];  Service: Gynecology;  Laterality: Bilateral;  . Vulvectomy  05/27/2012    Procedure: WIDE EXCISION VULVECTOMY;  Surgeon: Imagene Gurney A. Alycia Rossetti, MD;  Location: [facility];  Service: Gynecology;  Laterality: N/A;  Wide Local Excision of Vulva   . Vulvectomy N/A 11/23/2012    Procedure: WIDE LOCAL EXCISION OF THE VULVA;  Surgeon: Imagene Gurney A. Alycia Rossetti, MD;  Location: [facility];  Service: Gynecology;  Laterality: N/A;  left inferior vulvar biopsy excision left superior lesion left inferior vulvar excision  . Foot surgery Left 11/14    I&D left foot with woundvac placement  . Vulvectomy N/A 08/02/2013    Procedure: WIDE LOCAL  EXCISION VULVA;  Surgeon: Imagene Gurney A. Alycia Rossetti, MD;  Location: [facility];  Service: Gynecology;  Laterality: N/A;  . Tonsillectomy  1968  . Lumbar spine surgery  1998    L5 -- S1  . Dilation and curettage of uterus  1968  . Breast lumpectomy Left 03-27-2004  . Mastectomy Right 1997    W/  LYMPH NODE DISSECTION AND RECONSTRUCTION WITH IMPLANTS AND EXPANDER  . Thyroidectomy, partial  1983  . Tendon repair right ring finger  2011  . Laparoscopic cholecystectomy  06-21-2010  . Knee arthroscopy Bilateral left 01-1984 & 04-1992/   right [phone]  . Vulvectomy partial  07-16-2010  . Cardiac catheterization  10-30-2000  dr  Wynonia Lawman    normal coronary arteries and LVF/  ef 65-70%  . Removal right breast implant and capsulectomy  06-03-1999  . Thumb trigger release Right 1993  . Total knee arthroplasty Bilateral right 05-04-2000/  left 03-29-2001  . Closed manipulation  w/ open reduction exchange tibial femoral bearing post total left knee  04-14-2001  . Revision total knee arthroplasty Left 07-07-2005  &  12-10-2001  12-10-2001  REMOVAL TOTAL KNEE/ I&D / INSERTION ANTIBIOTIC SPACER  . Revision total knee arthroplasty Right 07-07-2005  . Port-a-cath placement and removal  1997  . Vulvectomy Left 03/29/2014    Procedure: WIDE LOCAL EXCISION OF LEFT VULVA ;  Surgeon: Imagene Gurney A. Alycia Rossetti, MD;  Location: [facility];  Service: Gynecology;  Laterality: Left;    Past Medical Hx:  Past Medical History  Diagnosis Date  . Macular degeneration of right eye   . Seasonal allergies   . GERD (gastroesophageal reflux disease)   . History of breast cancer no recurrence    1997  right breast cancer  s/p  mastectomy w/ snl dissection and chemoradiation/  2005  left breast cancer DCIS  s/p  lumpectomy and radiation  . Depression   . Hypothyroidism   . Asthma   . Fibromyalgia   . Arthritis   . Wears glasses   . Anxiety   . Numbness of left foot     4 toes  . History of colon polyps     2007 hyperplagia  . History of esophageal dilatation     2008  . History of small bowel obstruction     2012  . History of peptic ulcer   . Paget's disease of vulva   . PONV (postoperative nausea and vomiting)     and HARD TO WAKE  . Chronic constipation   . SUI (stress urinary incontinence, female)   . At risk for sleep apnea     STOP-BANG= 4     SENT TO PCP 03-27-2014    Family Hx:  Family History  Problem Relation Age of Onset  . Cancer Father 40    lung and kidney   . Alzheimer's disease Mother   . Alcohol abuse Brother   . Heart attack Maternal Grandmother     Vitals:  Blood pressure 153/75, pulse 88,  temperature 98.2 F (36.8 C), temperature source Oral, resp. rate 16, height 5\' 5"  (1.651 m), weight 253 lb 14.4 oz (115.168 kg).  Physical Exam: Well-nourished well-developed female in no acute distress.  Pelvic: External genitalia status post excision of the left labia. Well-healing surgical incisions. 3 sutures remained intact. One was removed without difficulty. Removal of an additional 1 caused the patient pain therefore we stopped. No evidence of any infection, induration, erythema.  Assessment/Plan: 30 -year-old with recurrent vulvar Paget's disease. We spent quite a bit time to try to figure out her abdominal pain was is really very nondescript. She was offered a CT scan is her exam is very limited due to habitus but she deferred previously. She now wishes to proceed. She'll return to see me in 3 months for follow-up of her Paget's disease. She knows that we will call her with the results of her CT scan.  Brittnee Gaetano A., MD 04/13/2014, 10:14 AM

## 2014-04-13 NOTE — Patient Instructions (Addendum)
Return to clinic in 3 months. We will call you with the results of your CT scan.

## 2014-04-28 ENCOUNTER — Ambulatory Visit (HOSPITAL_COMMUNITY)
Admission: RE | Admit: 2014-04-28 | Discharge: 2014-04-28 | Disposition: A | Discharge status: Home / Self Care | Payer: Medicare Other | Source: Ambulatory Visit | Attending: Gynecologic Oncology | Admitting: Gynecologic Oncology

## 2014-04-28 DIAGNOSIS — I771 Stricture of artery: Secondary | ICD-10-CM | POA: Diagnosis not present

## 2014-04-28 DIAGNOSIS — I700 Atherosclerosis of aorta: Secondary | ICD-10-CM | POA: Insufficient documentation

## 2014-04-28 DIAGNOSIS — M4316 Spondylolisthesis, lumbar region: Secondary | ICD-10-CM | POA: Insufficient documentation

## 2014-04-28 DIAGNOSIS — Z853 Personal history of malignant neoplasm of breast: Secondary | ICD-10-CM | POA: Insufficient documentation

## 2014-04-28 DIAGNOSIS — R1032 Left lower quadrant pain: Secondary | ICD-10-CM | POA: Diagnosis not present

## 2014-04-28 DIAGNOSIS — J9811 Atelectasis: Secondary | ICD-10-CM | POA: Diagnosis not present

## 2014-04-28 DIAGNOSIS — R1030 Lower abdominal pain, unspecified: Secondary | ICD-10-CM

## 2014-04-28 MED ORDER — IOHEXOL 300 MG/ML  SOLN
100.0000 mL | Freq: Once | INTRAMUSCULAR | Status: AC | PRN
  Administered 2014-04-28: 100 mL via INTRAVENOUS

## 2014-04-28 MED ORDER — IOHEXOL 300 MG/ML  SOLN: 100.0000 mL | Freq: Once | INTRAMUSCULAR | Status: AC | PRN

## 2014-05-01 ENCOUNTER — Telehealth: Payer: Self-pay | Admitting: Gynecologic Oncology

## 2014-05-01 NOTE — Telephone Encounter (Signed)
Patient called wanting CT scan results.  Informed of results.  Advised to call for any questions or concerns.

## 2014-05-29 ENCOUNTER — Ambulatory Visit (INDEPENDENT_AMBULATORY_CARE_PROVIDER_SITE_OTHER): Payer: Medicare Other

## 2014-05-29 ENCOUNTER — Ambulatory Visit (INDEPENDENT_AMBULATORY_CARE_PROVIDER_SITE_OTHER): Payer: Medicare Other | Admitting: Podiatry

## 2014-05-29 VITALS — BP 147/85 | HR 74 | Resp 16

## 2014-05-29 DIAGNOSIS — M779 Enthesopathy, unspecified: Secondary | ICD-10-CM

## 2014-05-29 DIAGNOSIS — M21612 Bunion of left foot: Secondary | ICD-10-CM

## 2014-05-29 DIAGNOSIS — L84 Corns and callosities: Secondary | ICD-10-CM | POA: Diagnosis not present

## 2014-05-29 DIAGNOSIS — M2012 Hallux valgus (acquired), left foot: Secondary | ICD-10-CM

## 2014-05-29 MED ORDER — TRIAMCINOLONE ACETONIDE 10 MG/ML IJ SUSP
10.0000 mg | Freq: Once | INTRAMUSCULAR | Status: AC
  Administered 2014-05-29: 10 mg

## 2014-05-30 NOTE — Progress Notes (Signed)
Subjective:     Patient ID: [name], female   DOB: 06/07/1945, 69 y.o.   MRN: [id number]  HPI patient presents with painful callus on the bottom of the left foot that is probably due to change in her foot position secondary to extensive knee and foot surgery over the last few years   Review of Systems     Objective:   Physical Exam Neurovascular status unchanged with diminished range of motion and fusion of the left ankle and subtalar joint with plantarflexed first metatarsal and inflammatory changes in the plantar capsule first metatarsal along with keratotic lesion formation    Assessment:     Problems secondary to structural deformity of the left foot secondary to previous surgery with inflammatory capsulitis noted    Plan:     Injected the plantar capsule left first MPJ 3 mg dexamethasone Kenalog 5 mg Xylocaine and debrided plantar lesion and reappoint as needed

## 2014-07-03 ENCOUNTER — Other Ambulatory Visit: Payer: Self-pay

## 2014-07-03 DIAGNOSIS — Z1231 Encounter for screening mammogram for malignant neoplasm of breast: Secondary | ICD-10-CM

## 2014-07-05 ENCOUNTER — Telehealth: Payer: Self-pay | Admitting: *Deleted

## 2014-07-05 ENCOUNTER — Ambulatory Visit (INDEPENDENT_AMBULATORY_CARE_PROVIDER_SITE_OTHER): Payer: Medicare Other | Admitting: Podiatry

## 2014-07-05 ENCOUNTER — Encounter: Payer: Self-pay | Admitting: Podiatry

## 2014-07-05 VITALS — BP 127/68 | HR 83 | Resp 16

## 2014-07-05 DIAGNOSIS — L84 Corns and callosities: Secondary | ICD-10-CM

## 2014-07-05 DIAGNOSIS — M779 Enthesopathy, unspecified: Secondary | ICD-10-CM | POA: Diagnosis not present

## 2014-07-05 DIAGNOSIS — M21612 Bunion of left foot: Secondary | ICD-10-CM

## 2014-07-05 DIAGNOSIS — M2012 Hallux valgus (acquired), left foot: Secondary | ICD-10-CM

## 2014-07-05 MED ORDER — TRIAMCINOLONE ACETONIDE 10 MG/ML IJ SUSP
10.0000 mg | Freq: Once | INTRAMUSCULAR | Status: AC
  Administered 2014-07-05: 10 mg

## 2014-07-05 NOTE — Telephone Encounter (Signed)
Patient called asking for [facility] stating she is in need of a refill.  Call transferred to [id number].

## 2014-07-05 NOTE — Progress Notes (Signed)
Subjective:     Patient ID: [name], female   DOB: 27-Jul-1945, 69 y.o.   MRN: [id number]  HPI patient presents with lesion underneath the first metatarsal that continues to have fluid buildup and is painful when pressed. Stated that she got relief for a short period of time when I trimmed at the last time but that it did return relatively quickly. Having trouble walking on this   Review of Systems     Objective:   Physical Exam Neurovascular status intact with inflammation still noted around the first metatarsal head left with fluid buildup noted and pain when palpated. Patient is noted to have a lesion within the center of this    Assessment:     Continued inflammatory capsulitis first MPJ left with callus which may be porokeratotic or verruca in nature    Plan:     Injected the plantar capsule 3 mg dexamethasone Kenalog and 5 mg Xylocaine and did deep debridement of lesion. I did note what appeared to be some wart cells saw I went ahead today and applied chemical to this with sterile dressing. Reappoint to recheck in 4 weeks

## 2014-07-06 ENCOUNTER — Telehealth: Payer: Self-pay | Admitting: *Deleted

## 2014-07-06 DIAGNOSIS — R609 Edema, unspecified: Secondary | ICD-10-CM

## 2014-07-06 DIAGNOSIS — C4499 Other specified malignant neoplasm of skin, unspecified: Secondary | ICD-10-CM

## 2014-07-06 NOTE — Telephone Encounter (Signed)
Called pt refill for aldactone 25mg  Q10# no refills. Instructed pt to follow up with her PCP regarding additional refills, labs as rx can affect her electrolytes. Pt verbalized understanding. Called in Rx.

## 2014-07-20 ENCOUNTER — Ambulatory Visit: Payer: Medicare Other | Attending: Gynecologic Oncology | Admitting: Gynecologic Oncology

## 2014-07-20 ENCOUNTER — Encounter: Payer: Self-pay | Admitting: Gynecologic Oncology

## 2014-07-20 DIAGNOSIS — C511 Malignant neoplasm of labium minus: Secondary | ICD-10-CM

## 2014-07-20 DIAGNOSIS — C4499 Other specified malignant neoplasm of skin, unspecified: Secondary | ICD-10-CM | POA: Insufficient documentation

## 2014-07-20 MED ORDER — IMIQUIMOD 5 % EX CREA: TOPICAL_CREAM | CUTANEOUS | Status: DC

## 2014-07-20 NOTE — Progress Notes (Signed)
Consult Note: Gyn-Onc  [name] 69 y.o. female  CC:  Chief Complaint  Patient presents with  . 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 Some of the surgical margins were positive. Her postoperative course was uncomplicated. She was last seen by our service in 12/14. At that time there was no evidence of any lesions consistent with Paget's. However, there is considerable amount of excoriations throughout the entire vulvar and medial thighs consistent with chronic vulvitis.In December 2013 she had a hernia repair due to bowel obstruction by Dr. Excell Seltzer with permanent mesh. In May 2013 she also had a TVT bladder by Dr. Everett Graff. She does continue to wear a pad and also takes Detrol twice daily but states that her urinary leakage is markedly improved. She described the vulvar irritation is intermittent itching with burning and that it has increased over time as has the pruritus. She does use a steroid cream twice daily and does scratch her vulva.   She underwent a wide local excision of the left vulva on January 2 of year 2014. Operative findings included 2 separate 3 x 3 and 1.2 cm lesions consistent with Paget's on the left.  Pathology revealed 2 foci of extramammary Paget's disease extending to the left inked margin in the right and anterior tip margins at multiple foci. The posterior margin was negative for malignancy.   I performed a biopsy of this it was consistent with recurrent Paget's disease. On 11/23/2012 showed a repeat wide local excision of liver x2 with the posterior fourchette biopsy. Findings revealed along the left labia minora/vagina is a Paget's disease measuring approximately 2.5 x 1 cm in the superior aspect of her prior left vulvar excision. The inferior aspect of a similar lesion. 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 On  the left perineal body the area is somewhat excoriated pale. Biopsy was performed in this area.  Pathology revealed:  1. Vulva, biopsy, left inferior - EXTRAMAMMARY PAGET DISEASE EXTENDING TO THE EDGES OF THE BIOPSY. 2. Vulva, excision, left superior lesion suture marks 1200 - EXTRAMAMMARY PAGET DISEASE. - MARGINS NOT INVOLVED. - CLOSEST MARGIN 9 O'CLOCK, LESS THAN 0.1 CM. 3. Vulva, excision, left inferior excision suture marks 1200 - EXTRAMAMMARY PAGET DISEASE, 12 O'CLOCK MARGIN FOCALLY INVOLVED. - REMAINING MARGINS CLEAR.  We have discussed repeating surgery however she had multiple orthopedic issues had broken ankle. She was last seen by [name] in December 2014. Exam at that time was fairly unremarkable.  I had last seen her in January of 2015. At that time exam was concerning for Paget's.   On 08/02/2013 she underwent wide local excisions of the vulva x2.  Operative findings included pale pink raised lesion measuring 1 x 0.5 cm the left upper vulva near the prior excision site. There is a pale raised lesion measuring 1.5 x 1 cm on the left crossing midline perineal body.  Pathology revealed left superior vulvar excision with extramammary Paget's disease extending to the 9:00 margin. The other margins were negative for tumor. Of the left of midline lesion she extramammary Paget's disease extending to the 12:00 margin and focally at the 6:00 margin but otherwise negative margins. The patient at that time discussed with [place] that she was having some right lower quadrant pain. Her exam is very difficult secondary to habitus. There ultrasound was obtained. Ultrasound revealed:  FINDINGS:  UterusMeasurements: 4.6 x 3.5 x 3.8 cm. 2.5 x 1.7 x 1.6 cm uterine fibroid. This is present in the right lateral aspect of the body uterus.  Endometrium Thickness: 6.1 mm. No focal abnormality visualized.  Right ovary: No focal abnormality.  Left ovary :No focal abnormality.  Other findings: No free  fluid  IMPRESSION:  Uterine fibroid otherwise negative exam.  INTERVAL HISTORY:  I saw her March 16, 2014 at which time there was an area on the left vulva with hyperkeratosis. A biopsy of that area was performed consistent with Paget's disease. On March 29, 2014 she underwent wide local excision of the vulva. On exam she had a 1 cm left vulvar lesion just below the left labia minora. She underwent a wide local excision. He received a 3.7 x 1.7 x 0.3 cm lesion. There was extramammary Paget's disease extending to the 6 to 9:00 margin in the 12:00 margin. This is despite grossly negative margins on physical examination.   She's overall doing fairly well. In November when I saw her she had increasing abdominal pain we obtain a CT scan that was negative. She and her husband had been dieting and she's lost 22 pounds since I saw her in November. She's overall has less abdominal pain with the weight loss. She does complain of some bilateral groin pain. She does have some mild vulvar pruritus. Otherwise she has no complaints.  Current Meds:  Outpatient Encounter Prescriptions as of 07/20/2014  Medication Sig  . acetaminophen (TYLENOL) 500 MG tablet Take 500 mg by mouth every 6 (six) hours as needed for pain.  Marland Kitchen albuterol (PROVENTIL HFA;VENTOLIN HFA) 108 (90 BASE) MCG/ACT inhaler Inhale 1 puff into the lungs every 6 (six) hours as needed for wheezing or shortness of breath.  . beta carotene w/minerals (OCUVITE) tablet Take 1 tablet by mouth daily.  . budesonide-formoterol (SYMBICORT) 160-4.5 MCG/ACT inhaler Inhale 2 puffs into the lungs.  Marland Kitchen buPROPion (WELLBUTRIN XL) 150 MG 24 hr tablet Take 150 mg by mouth every morning.   . Calcium Carbonate-Vitamin D 600-125 MG-UNIT TABS Take 1 tablet by mouth daily.   . chlorpheniramine-HYDROcodone (TUSSIONEX) 10-8 MG/5ML LQCR Take by mouth as needed.   . citalopram (CELEXA) 40 MG tablet Take 40 mg by mouth every morning.   . clindamycin (CLEOCIN) 300 MG capsule  Take 1,200 mg by mouth as directed. Only when going to the dentist, due to hardware in left leg  . clotrimazole-betamethasone (LOTRISONE) cream Apply topically 2 (two) times daily. (Patient taking differently: Apply topically 2 (two) times daily as needed. )  . docusate sodium (COLACE) 100 MG capsule Take 100 mg by mouth daily.   . fluticasone (FLOVENT HFA) 44 MCG/ACT inhaler Inhale 2 puffs into the lungs as needed.   Marland Kitchen ipratropium-albuterol (DUONEB) 0.5-2.5 (3) MG/3ML SOLN 3 mLs.  Marland Kitchen levothyroxine (SYNTHROID, LEVOTHROID) 150 MCG tablet   . levothyroxine (SYNTHROID, LEVOTHROID) 175 MCG tablet Take 175 mcg by mouth every morning.   . montelukast (SINGULAIR) 10 MG tablet Take 10 mg by mouth at bedtime.  . Multiple Vitamins-Minerals (MULTIVITAMIN GUMMIES ADULT) CHEW Chew 2 tablets by mouth daily.  . Omega-3 1000 MG CAPS Take 1 g by mouth daily.   . pantoprazole (PROTONIX) 40 MG tablet Take 40 mg by mouth every morning.   . Polyethylene Glycol 3350 (MIRALAX PO) Take 17 g by mouth at bedtime.   . polyvinyl alcohol (LIQUIFILM TEARS) 1.4 % ophthalmic solution Place 1 drop into both eyes as needed for dry eyes.  Marland Kitchen  predniSONE (DELTASONE) 20 MG tablet   . spironolactone (ALDACTONE) 25 MG tablet Take 0.5-1 tablets (12.5-25 mg total) by mouth as needed (for swelling).    Allergy:  Allergies  Allergen Reactions  . Codeine Other (See Comments)    Hyper/ Hallucinations   . Demerol [Meperidine] Other (See Comments)    "Knocks me out"  . Diazepam Other (See Comments)    Extreme sedation  . Tape Hives and Other (See Comments)    "thick clear plastic tape" causes blisters  . Morphine And Related Itching and Rash    Per CRNA, pt tol Fentanyl IV for OR case, no issues reported to rec RN in PACU, no immediate PACU issues noted as well upon arrival  . Penicillins Hives and Swelling    Social Hx:   History   Social History  . Marital Status: Married    Spouse Name: N/A  . Number of Children: N/A  .  Years of Education: N/A   Occupational History  . Not on file.   Social History Main Topics  . Smoking status: Former Smoker -- 2.00 packs/day for 41 years    Types: Cigarettes    Quit date: 06/05/1999  . Smokeless tobacco: Never Used  . Alcohol Use: No  . Drug Use: No  . Sexual Activity: Not on file   Other Topics Concern  . Not on file   Social History Narrative    Past Surgical Hx:  Past Surgical History  Procedure Laterality Date  . Foot surgery Left 02-01-2009    TRIPLE ARTHRODESIS  . Rotator cuff repair Right   . Incisional hernia repair  05/05/2011    Procedure: LAPAROSCOPIC INCISIONAL HERNIA;  Surgeon: Edward Jolly, MD;  Location: [facility];  Service: General;  Laterality: N/A;  LAPAROSCOPIC REPAIR INCARCERATED INCISIONAL HERNIA  WITH MESH  . Bladder suspension  10/13/2011    Procedure: TRANSVAGINAL TAPE (TVT) PROCEDURE;  Surgeon: Delice Lesch, MD;  Location: [facility];  Service: Gynecology;  Laterality: N/A;  . Cystoscopy  10/13/2011    Procedure: CYSTOSCOPY;  Surgeon: Delice Lesch, MD;  Location: [facility];  Service: Gynecology;  Laterality: Bilateral;  . Vulvectomy  05/27/2012    Procedure: WIDE EXCISION VULVECTOMY;  Surgeon: Imagene Gurney A. Alycia Rossetti, MD;  Location: [facility];  Service: Gynecology;  Laterality: N/A;  Wide Local Excision of Vulva   . Vulvectomy N/A 11/23/2012    Procedure: WIDE LOCAL EXCISION OF THE VULVA;  Surgeon: Imagene Gurney A. Alycia Rossetti, MD;  Location: [facility];  Service: Gynecology;  Laterality: N/A;  left inferior vulvar biopsy excision left superior lesion left inferior vulvar excision  . Foot surgery Left 11/14    I&D left foot with woundvac placement  . Vulvectomy N/A 08/02/2013    Procedure: WIDE LOCAL  EXCISION VULVA;  Surgeon: Imagene Gurney A. Alycia Rossetti, MD;  Location: [facility];  Service: Gynecology;  Laterality: N/A;  . Tonsillectomy  1968  . Lumbar spine surgery  1998    L5 -- S1  . Dilation and curettage of uterus  1968  . Breast lumpectomy Left 03-27-2004  .  Mastectomy Right 1997    W/  LYMPH NODE DISSECTION AND RECONSTRUCTION WITH IMPLANTS AND EXPANDER  . Thyroidectomy, partial  1983  . Tendon repair right ring finger  2011  . Laparoscopic cholecystectomy  06-21-2010  . Knee arthroscopy Bilateral left 01-1984 & 04-1992/   right [phone]  . Vulvectomy partial  07-16-2010  . Cardiac catheterization  10-30-2000  dr Wynonia Lawman    normal coronary  arteries and LVF/  ef 65-70%  . Removal right breast implant and capsulectomy  06-03-1999  . Thumb trigger release Right 1993  . Total knee arthroplasty Bilateral right 05-04-2000/  left 03-29-2001  . Closed manipulation  w/ open reduction exchange tibial femoral bearing post total left knee  04-14-2001  . Revision total knee arthroplasty Left 07-07-2005  &  12-10-2001    12-10-2001  REMOVAL TOTAL KNEE/ I&D / INSERTION ANTIBIOTIC SPACER  . Revision total knee arthroplasty Right 07-07-2005  . Port-a-cath placement and removal  1997  . Vulvectomy Left 03/29/2014    Procedure: WIDE LOCAL EXCISION OF LEFT VULVA ;  Surgeon: Imagene Gurney A. Alycia Rossetti, MD;  Location: [facility];  Service: Gynecology;  Laterality: Left;    Past Medical Hx:  Past Medical History  Diagnosis Date  . Macular degeneration of right eye   . Seasonal allergies   . GERD (gastroesophageal reflux disease)   . History of breast cancer no recurrence    1997  right breast cancer  s/p  mastectomy w/ snl dissection and chemoradiation/  2005  left breast cancer DCIS  s/p  lumpectomy and radiation  . Depression   . Hypothyroidism   . Asthma   . Fibromyalgia   . Arthritis   . Wears glasses   . Anxiety   . Numbness of left foot     4 toes  . History of colon polyps     2007 hyperplagia  . History of esophageal dilatation     2008  . History of small bowel obstruction     2012  . History of peptic ulcer   . Paget's disease of vulva   . PONV (postoperative nausea and vomiting)     and HARD TO WAKE  . Chronic constipation   . SUI  (stress urinary incontinence, female)   . At risk for sleep apnea     STOP-BANG= 4     SENT TO PCP 03-27-2014    Family Hx:  Family History  Problem Relation Age of Onset  . Cancer Father 31    lung and kidney   . Alzheimer's disease Mother   . Alcohol abuse Brother   . Heart attack Maternal Grandmother     Vitals:  There were no vitals taken for this visit.  Physical Exam: Well-nourished well-developed female in no acute distress.  Pelvic: External genitalia status post excision of the left labia. Small labia minora remains on the left. Lateral to this, there is a 2x1 cm patch that is concerning for recurrent vulvar Paget's.  Assessment/Plan: 75 -year-old with recurrent vulvar Paget's disease. We discussed the use of aldara  for her Paget's disease. After our discussion she wishes to proceed. A prescription was sent to her pharmacy and instructions were given. She will return to see me in 3 months. She does all be happy to see her sooner should the need arise. She was congratulated on her weight loss efforts.  Naethan Bracewell A., MD 07/20/2014, 11:34 AM

## 2014-07-20 NOTE — Patient Instructions (Signed)
Imiquimod skin cream What is this medicine? IMIQUIMOD (i mi KWI mod) cream is used to treat external genital or anal warts. It is also used to treat other skin conditions such as actinic keratosis and certain types of skin cancer. This medicine may be used for other purposes; ask your health care provider or pharmacist if you have questions. COMMON BRAND NAME(S): Tawni Levy What should I tell my health care provider before I take this medicine? They need to know if you have any of these conditions: -decreased immune function -an unusual or allergic reaction to imiquimod, other medicines, foods, dyes, or preservatives -pregnant or trying to get pregnant -breast-feeding How should I use this medicine? This medicine is for external use only. Do not take by mouth. Follow the directions on the prescription label. Apply just before bedtime. Wash your hands before and after use. Apply a thin layer of cream and massage gently into the affected areas until no longer visible. Do not use in the mouth, eyes or the vagina. Use this medicine only on the affected area as directed by your health care provider. Do not use for longer than prescribed. It is important not to use more medicine than prescribed. To do so may increase the chance of side effects. Talk to your pediatrician regarding the use of this medicine in children. While this drug may be prescribed for children as young as 92 years of age for selected conditions, precautions do apply. Overdosage: If you think you have taken too much of this medicine contact a poison control center or emergency room at once. NOTE: This medicine is only for you. Do not share this medicine with others. What if I miss a dose? If you miss a dose, use it as soon as you can. If it is almost time for your next dose, use only that dose. Do not use double or extra doses. What may interact with this medicine? Interactions are not expected. Do not use any other medicines on  the treated area without asking your doctor or health care professional. This list may not describe all possible interactions. Give your health care provider a list of all the medicines, herbs, non-prescription drugs, or dietary supplements you use. Also tell them if you smoke, drink alcohol, or use illegal drugs. Some items may interact with your medicine. What should I watch for while using this medicine? Visit your health care professional for regular checks on your progress. Do not use this medicine until the skin has healed from any other drug (example: podofilox or podophyllin resin) or surgical skin treatment. Females should receive regular pelvic exams while being treated for genital warts. Most patients see improvement within 4 weeks. It may take up to 16 weeks to see a full clearing of the warts. This medicine is not a cure. New warts may develop during or after treatment. Avoid sexual (genital, anal, oral) contact while the cream is on the skin. If warts are visible in the genital area, sexual contact should be avoided until the warts are treated. The use of latex condoms during sexual contact may reduce, but not entirely prevent, infecting others. This medicine may weaken condoms, diaphragms, cervical caps or other barrier devices and make them less effective as birth control. Do not cover the treated area with an airtight bandage. Cotton gauze dressings can be used. Cotton underwear can be worn after using this medicine on the genital or anal area. Actinic keratoses that were not seen before may appear during treatment and  may later go away. The treatment area and surrounding area may lighten or darken after treatment with this medicine. These skin color changes may be permanent in some patients. If you experience a skin reaction at the treatment site that interferes or prevents you from doing any daily activity, contact your health care provider. You may need a rest period from treatment.  Treatment may be restarted once the reaction has gotten better as recommended by your doctor or health care professional. This medicine can make you more sensitive to the sun. Keep out of the sun. If you cannot avoid being in the sun, wear protective clothing and use sunscreen. Do not use sun lamps or tanning beds/booths. What side effects may I notice from receiving this medicine? Side effects that you should report to your doctor or health care professional as soon as possible: -open sores with or without drainage -skin infection -skin rash -unusual or severe skin reaction Side effects that usually do not require medical attention (report to your doctor or health care professional if they continue or are bothersome): -burning or itching -redness of the skin (very common but is usually not painful or harmful) -scabbing, crusting, or peeling skin -skin that becomes hard or thickened -swelling of the skin This list may not describe all possible side effects. Call your doctor for medical advice about side effects. You may report side effects to FDA at 1-800-FDA-1088. Where should I keep my medicine? Keep out of the reach of children. Store between 4 and 25 degrees C (39 and 77 degrees F). Do not freeze. Throw away any unused medicine after the expiration date. Discard packet after applying to affected area. Partial packets should not be saved or reused. NOTE: This sheet is a summary. It may not cover all possible information. If you have questions about this medicine, talk to your doctor, pharmacist, or health care provider.  2015, [employer]. (2008-04-25 10:33:25)

## 2014-07-31 ENCOUNTER — Ambulatory Visit (INDEPENDENT_AMBULATORY_CARE_PROVIDER_SITE_OTHER): Payer: Medicare Other | Admitting: Podiatry

## 2014-07-31 ENCOUNTER — Encounter: Payer: Self-pay | Admitting: Podiatry

## 2014-07-31 VITALS — BP 100/56 | HR 69 | Resp 12

## 2014-07-31 DIAGNOSIS — L84 Corns and callosities: Secondary | ICD-10-CM | POA: Diagnosis not present

## 2014-07-31 DIAGNOSIS — M779 Enthesopathy, unspecified: Secondary | ICD-10-CM | POA: Diagnosis not present

## 2014-07-31 MED ORDER — CLINDAMYCIN HCL 300 MG PO CAPS: 300.0000 mg | ORAL_CAPSULE | Freq: Three times a day (TID) | ORAL | Status: DC

## 2014-07-31 NOTE — Progress Notes (Signed)
Subjective:     Patient ID: [name], female   DOB: 1945-12-18, 69 y.o.   MRN: [id number]  HPI patient presents stating I'm still having pain on the bottom of my left foot and I'm not sure if this is healed. I did get a little bit of blister after the procedure   Review of Systems     Objective:   Physical Exam Neurovascular status intact with keratotic lesion sub-first metatarsal left that has a Black core to it but no indications of odor or drainage    Assessment:     Lesion secondary to pressure with possibility for irritation from previous wart    Plan:     Debrided the lesion and flushed and did not note any active drainage present. Applied padding around the area and she stated it felt much better and instructed on soaks and reappoint in 2 weeks

## 2014-08-02 ENCOUNTER — Ambulatory Visit: Payer: Medicare Other | Admitting: Podiatry

## 2014-08-14 ENCOUNTER — Other Ambulatory Visit: Payer: Self-pay

## 2014-08-14 ENCOUNTER — Ambulatory Visit
Admission: RE | Admit: 2014-08-14 | Discharge: 2014-08-14 | Disposition: A | Discharge status: Home / Self Care | Payer: Medicare Other | Source: Ambulatory Visit

## 2014-08-14 DIAGNOSIS — Z1231 Encounter for screening mammogram for malignant neoplasm of breast: Secondary | ICD-10-CM

## 2014-08-16 ENCOUNTER — Ambulatory Visit: Payer: Medicare Other | Admitting: Podiatry

## 2014-08-21 ENCOUNTER — Ambulatory Visit (INDEPENDENT_AMBULATORY_CARE_PROVIDER_SITE_OTHER): Payer: Medicare Other

## 2014-08-21 ENCOUNTER — Ambulatory Visit (INDEPENDENT_AMBULATORY_CARE_PROVIDER_SITE_OTHER): Payer: Medicare Other | Admitting: Podiatry

## 2014-08-21 VITALS — BP 145/81 | HR 69 | Resp 16

## 2014-08-21 DIAGNOSIS — M79672 Pain in left foot: Secondary | ICD-10-CM | POA: Diagnosis not present

## 2014-08-21 NOTE — Progress Notes (Signed)
Subjective:     Patient ID: [name], female   DOB: 04/09/1946, 69 y.o.   MRN: [id number]  HPI patient presents stating it still hurts underneath my left foot and makes it hard for me to walk   Review of Systems     Objective:   Physical Exam Neurovascular status unchanged with continued keratotic lesion sub-first metatarsal left that's painful when pressed and makes walking difficult. We have tried trimming and I tried wart treatment with symptoms still persisting with no erythema or drainage noted around the area and discomfort directly to palpation of the area    Assessment:     Most likely bone pressure secondary to severe bony deformity creating pressure against this area and leading to the pain that she experiences    Plan:     Reviewed condition with Dr. Jacqualyn Posey who agrees with me and today we put her into a surgical shoe with an offloading pad to try to take pressure off the area. She will continue soaks and finish her antibiotics and will be seen back in 3 weeks or earlier if any issues should occur

## 2014-09-27 ENCOUNTER — Encounter: Payer: Self-pay | Admitting: Podiatry

## 2014-09-27 ENCOUNTER — Ambulatory Visit (INDEPENDENT_AMBULATORY_CARE_PROVIDER_SITE_OTHER): Payer: Medicare Other

## 2014-09-27 ENCOUNTER — Telehealth: Payer: Self-pay | Admitting: *Deleted

## 2014-09-27 ENCOUNTER — Ambulatory Visit (INDEPENDENT_AMBULATORY_CARE_PROVIDER_SITE_OTHER): Payer: Medicare Other | Admitting: Podiatry

## 2014-09-27 VITALS — BP 125/76 | HR 83 | Resp 15

## 2014-09-27 DIAGNOSIS — M898X9 Other specified disorders of bone, unspecified site: Secondary | ICD-10-CM | POA: Diagnosis not present

## 2014-09-27 DIAGNOSIS — M79672 Pain in left foot: Secondary | ICD-10-CM

## 2014-09-27 NOTE — Telephone Encounter (Signed)
"  I was told to call you to schedule surgery.  May 10th will be fine."  Okay, we'll get it scheduled the surgical center will call you with the arrival time probably on Friday.  "Alright, thank you."

## 2014-09-27 NOTE — Patient Instructions (Signed)
Pre-Operative Instructions  Congratulations, you have decided to take an important step to improving your quality of life.  You can be assured that the doctors of [facility] will be with you every step of the way.  1. Plan to be at the surgery center/hospital at least 1 (one) hour prior to your scheduled time unless otherwise directed by the surgical center/hospital staff.  You must have a responsible adult accompany you, remain during the surgery and drive you home.  Make sure you have directions to the surgical center/hospital and know how to get there on time. 2. For hospital based surgery you will need to obtain a history and physical form from your family physician within 1 month prior to the date of surgery- we will give you a form for you primary physician.  3. We make every effort to accommodate the date you request for surgery.  There are however, times where surgery dates or times have to be moved.  We will contact you as soon as possible if a change in schedule is required.   4. No Aspirin/Ibuprofen for one week before surgery.  If you are on aspirin, any non-steroidal anti-inflammatory medications (Mobic, Aleve, Ibuprofen) you should stop taking it 7 days prior to your surgery.  You make take Tylenol  For pain prior to surgery.  5. Medications- If you are taking daily heart and blood pressure medications, seizure, reflux, allergy, asthma, anxiety, pain or diabetes medications, make sure the surgery center/hospital is aware before the day of surgery so they may notify you which medications to take or avoid the day of surgery. 6. No food or drink after midnight the night before surgery unless directed otherwise by surgical center/hospital staff. 7. No alcoholic beverages 24 hours prior to surgery.  No smoking 24 hours prior to or 24 hours after surgery. 8. Wear loose pants or shorts- loose enough to fit over bandages, boots, and casts. 9. No slip on shoes, sneakers are best. 10. Bring  your boot with you to the surgery center/hospital.  Also bring crutches or a walker if your physician has prescribed it for you.  If you do not have this equipment, it will be provided for you after surgery. 11. If you have not been contracted by the surgery center/hospital by the day before your surgery, call to confirm the date and time of your surgery. 12. Leave-time from work may vary depending on the type of surgery you have.  Appropriate arrangements should be made prior to surgery with your employer. 13. Prescriptions will be provided immediately following surgery by your doctor.  Have these filled as soon as possible after surgery and take the medication as directed. 14. Remove nail polish on the operative foot. 15. Wash the night before surgery.  The night before surgery wash the foot and leg well with the antibacterial soap provided and water paying special attention to beneath the toenails and in between the toes.  Rinse thoroughly with water and dry well with a towel.  Perform this wash unless told not to do so by your physician.  Enclosed: 1 Ice pack (please put in freezer the night before surgery)   1 Hibiclens skin cleaner   Pre-op Instructions  If you have any questions regarding the instructions, do not hesitate to call our office.  [place]: [address] [place], [place] [address] [phone]  [place]: [address] , [place] [address] [phone]  [place]: [address]  , [place] [address] [phone]  Dr. Richard   Tuchman DPM, Dr. Norman Regal DPM Dr. Richard Sikora DPM, Dr. M. Todd Hyatt DPM, Dr. Kathryn Egerton DPM 

## 2014-09-27 NOTE — Progress Notes (Signed)
Subjective:     Patient ID: [name], female   DOB: 1945/10/09, 69 y.o.   MRN: [id number]  HPI patient presents stating I have this area on the bottom my left foot that get real sore and no matter what we try nothing is working and I know they're something in there   Review of Systems     Objective:   Physical Exam Her vascular status intact muscle strength adequate with discomfort around the fibular sesamoidal complex left with prominence of the area and upon debridement no indications of drainage or other issues associated with the small skin lesion present    Assessment:     Appears to be bone trauma and thickness that's causing her pain    Plan:     At this time I re-x-rayed and then discussed this case at great length with patient. At this point were to go ahead and remove the fibular sesamoid and see if this relieves her pain. I may not be able to remove it entirely may remove a part of it but that will all depend on what it looks like when I get in there. She wants surgery understanding is no guarantee this was all of her problem and understands risk and at this time I allowed her to read consent form reviewing alternative treatments and procedure and complications. She is comfortable with all the signs consent form understanding total recovery. Can take up to 6 months and is scheduled for outpatient surgery in is encouraged to call with any other questions

## 2014-10-03 ENCOUNTER — Encounter: Payer: Self-pay | Admitting: Podiatry

## 2014-10-03 DIAGNOSIS — M84879 Other disorders of continuity of bone, unspecified ankle and foot: Secondary | ICD-10-CM

## 2014-10-09 ENCOUNTER — Ambulatory Visit: Payer: Medicare Other | Admitting: Podiatry

## 2014-10-10 ENCOUNTER — Other Ambulatory Visit: Payer: Self-pay | Admitting: Podiatry

## 2014-10-10 ENCOUNTER — Ambulatory Visit (INDEPENDENT_AMBULATORY_CARE_PROVIDER_SITE_OTHER): Payer: Medicare Other

## 2014-10-10 ENCOUNTER — Ambulatory Visit (INDEPENDENT_AMBULATORY_CARE_PROVIDER_SITE_OTHER): Payer: Medicare Other | Admitting: Podiatry

## 2014-10-10 VITALS — BP 118/79 | HR 72 | Resp 13

## 2014-10-10 DIAGNOSIS — Z9889 Other specified postprocedural states: Secondary | ICD-10-CM

## 2014-10-10 MED ORDER — HYDROCODONE-ACETAMINOPHEN 5-325 MG PO TABS: 1.0000 | ORAL_TABLET | Freq: Four times a day (QID) | ORAL | Status: DC | PRN

## 2014-10-10 NOTE — Progress Notes (Signed)
She presents today 1 week status post fibular sesamoidectomy first metatarsophalangeal joint of the left foot. She denies fever chills nausea vomiting muscle aches and pains. She states that is moderately tender when she steps down on it but she continues to use a walker on a regular basis.  Objective: Vital signs are stable she's alert and oriented 3. Pulses are strongly palpable left foot. Incision line to the plantar aspect of the first metatarsophalangeal joint appears to be intact margins are well coapted with nylon suture. No signs of infection no purulence no drainage. She has good range of motion of the first metatarsophalangeal joint. Hallux malleus is present. Radiographic evaluation demonstrates excision of fibular sesamoid.  Assessment: Well-healing surgical foot 1 week left.  Plan: [place] today with a dry sterile compressive dressing encouraged her to stay off the foot as much as she can she will follow up with Dr. Paulla Dolly in 1 week

## 2014-10-10 NOTE — Progress Notes (Signed)
DOS 10/03/2014 Removal of fibular sesamoid bone left 1st metatarsal.

## 2014-10-11 ENCOUNTER — Encounter: Payer: Self-pay | Admitting: Gynecologic Oncology

## 2014-10-11 ENCOUNTER — Ambulatory Visit: Payer: Medicare Other | Attending: Gynecologic Oncology | Admitting: Gynecologic Oncology

## 2014-10-11 VITALS — BP 123/55 | HR 85 | Temp 97.3°F | Resp 17 | Ht 65.0 in | Wt 223.0 lb

## 2014-10-11 DIAGNOSIS — C4499 Other specified malignant neoplasm of skin, unspecified: Secondary | ICD-10-CM

## 2014-10-11 MED ORDER — IMIQUIMOD 5 % EX CREA: TOPICAL_CREAM | CUTANEOUS | Status: DC

## 2014-10-11 NOTE — Progress Notes (Signed)
Consult Note: Gyn-Onc  [name] 69 y.o. female  CC:  Chief Complaint  Patient presents with  . 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 Some of the surgical margins were positive. Her postoperative course was uncomplicated. She was last seen by our service in 12/14. At that time there was no evidence of any lesions consistent with Paget's. However, there is considerable amount of excoriations throughout the entire vulvar and medial thighs consistent with chronic vulvitis.In December 2013 she had a hernia repair due to bowel obstruction by Dr. Excell Seltzer with permanent mesh. In May 2013 she also had a TVT bladder by Dr. Everett Graff. She does continue to wear a pad and also takes Detrol twice daily but states that her urinary leakage is markedly improved. She described the vulvar irritation is intermittent itching with burning and that it has increased over time as has the pruritus. She does use a steroid cream twice daily and does scratch her vulva.   She underwent a wide local excision of the left vulva on January 2 of year 2014. Operative findings included 2 separate 3 x 3 and 1.2 cm lesions consistent with Paget's on the left.  Pathology revealed 2 foci of extramammary Paget's disease extending to the left inked margin in the right and anterior tip margins at multiple foci. The posterior margin was negative for malignancy.   I performed a biopsy of this it was consistent with recurrent Paget's disease. On 11/23/2012 showed a repeat wide local excision of liver x2 with the posterior fourchette biopsy. Findings revealed along the left labia minora/vagina is a Paget's disease measuring approximately 2.5 x 1 cm in the superior aspect of her prior left vulvar excision. The inferior aspect of a similar lesion. 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  On the left perineal body the area is somewhat excoriated pale. Biopsy was performed in this area.  Pathology revealed:  1. Vulva, biopsy, left inferior - EXTRAMAMMARY PAGET DISEASE EXTENDING TO THE EDGES OF THE BIOPSY. 2. Vulva, excision, left superior lesion suture marks 1200 - EXTRAMAMMARY PAGET DISEASE. - MARGINS NOT INVOLVED. - CLOSEST MARGIN 9 O'CLOCK, LESS THAN 0.1 CM. 3. Vulva, excision, left inferior excision suture marks 1200 - EXTRAMAMMARY PAGET DISEASE, 12 O'CLOCK MARGIN FOCALLY INVOLVED. - REMAINING MARGINS CLEAR.  We have discussed repeating surgery however she had multiple orthopedic issues had broken ankle. She was last seen by [name] in December 2014. Exam at that time was fairly unremarkable.  I had last seen her in January of 2015. At that time exam was concerning for Paget's.   On 08/02/2013 she underwent wide local excisions of the vulva x2.  Operative findings included pale pink raised lesion measuring 1 x 0.5 cm the left upper vulva near the prior excision site. There is a pale raised lesion measuring 1.5 x 1 cm on the left crossing midline perineal body.  Pathology revealed left superior vulvar excision with extramammary Paget's disease extending to the 9:00 margin. The other margins were negative for tumor. Of the left of midline lesion she extramammary Paget's disease extending to the 12:00 margin and focally at the 6:00 margin but otherwise negative margins. The patient at that time discussed with [place] that she was having some right lower quadrant pain. Her exam is very difficult secondary to habitus. There ultrasound was obtained. Ultrasound revealed:  FINDINGS:  UterusMeasurements: 4.6 x 3.5 x 3.8 cm. 2.5 x 1.7 x 1.6 cm uterine fibroid. This is present in the right lateral aspect of the body uterus.  Endometrium Thickness: 6.1 mm. No focal abnormality visualized.  Right ovary: No focal abnormality.  Left ovary :No focal abnormality.  Other findings: No free  fluid  IMPRESSION:  Uterine fibroid otherwise negative exam.  I saw her March 16, 2014 at which time there was an area on the left vulva with hyperkeratosis. A biopsy of that area was performed consistent with Paget's disease. On March 29, 2014 she underwent wide local excision of the vulva. On exam she had a 1 cm left vulvar lesion just below the left labia minora. She underwent a wide local excision. He received a 3.7 x 1.7 x 0.3 cm lesion. There was extramammary Paget's disease extending to the 6 to 9:00 margin in the 12:00 margin. This is despite grossly negative margins on physical examination.   She had an excision in November that revealed Diagnosis Vulva, excision, w/stitch @ 1200 - EXTRAMAMMARY PAGET'S DISEASE, EXTENDING TO THE 6-9 O'CLOCK MARGIN AND 12 O'CLOCK PERPENDICULAR MARGIN.  I last saw her in February at which time there was concern for recurrent disease. She decided to proceed with Aldara therapy comes in today for follow-up. She is healed daughter for approximate 4 weeks and then he decided to stop it secondary to expensive medication. She then started using the clobetasol. While she was using the Aldara all the pruritus in her vulva couldn't really resolved itself. Since she stopped it has been using the steroid cream the pruritus is returned. She's recently had surgery on her left foot. She's been told that she has osteoporosis. She has intentionally lost another 8 pounds since we last saw her. She otherwise has no complaints from a gynecologic perspective other than vulvar itching.  Current Meds:  Outpatient Encounter Prescriptions as of 10/11/2014  Medication Sig  . acetaminophen (TYLENOL) 500 MG tablet Take 500 mg by mouth every 6 (six) hours as needed for pain.  Marland Kitchen albuterol (PROVENTIL HFA;VENTOLIN HFA) 108 (90 BASE) MCG/ACT inhaler Inhale 1 puff into the lungs every 6 (six) hours as needed for wheezing or shortness of breath.  . beta carotene w/minerals (OCUVITE)  tablet Take 1 tablet by mouth daily.  . budesonide-formoterol (SYMBICORT) 160-4.5 MCG/ACT inhaler Inhale 2 puffs into the lungs as needed.   Marland Kitchen buPROPion (WELLBUTRIN XL) 150 MG 24 hr tablet Take 150 mg by mouth every morning.   . Calcium Carbonate-Vitamin D 600-125 MG-UNIT TABS Take 1 tablet by mouth daily.   . citalopram (CELEXA) 40 MG tablet Take 40 mg by mouth every morning.   . clindamycin (CLEOCIN) 300 MG capsule Take 1,200 mg by mouth as directed. Only when going to the dentist, due to hardware in left leg  . clotrimazole-betamethasone (LOTRISONE) cream Apply topically 2 (two) times daily. (Patient taking differently: Apply topically 2 (two) times daily as needed. )  . docusate sodium (COLACE) 100 MG capsule Take 100 mg by mouth daily.   . fluticasone (FLOVENT HFA) 44 MCG/ACT inhaler Inhale 2 puffs into the lungs as needed.   Marland Kitchen HYDROcodone-acetaminophen (NORCO/VICODIN) 5-325 MG per tablet Take 1-2 tablets by mouth every 6 (six) hours as needed for moderate pain.  Marland Kitchen ipratropium-albuterol (DUONEB) 0.5-2.5 (3) MG/3ML SOLN 3 mLs as needed.   Marland Kitchen levothyroxine (SYNTHROID, LEVOTHROID) 175 MCG tablet Take 175 mcg by mouth every morning.   . montelukast (SINGULAIR) 10 MG tablet Take 10  mg by mouth at bedtime.  . Multiple Vitamins-Minerals (MULTIVITAMIN GUMMIES ADULT) CHEW Chew 2 tablets by mouth daily.  . Omega-3 1000 MG CAPS Take 1 g by mouth daily.   . pantoprazole (PROTONIX) 40 MG tablet Take 40 mg by mouth every morning.   . Polyethylene Glycol 3350 (MIRALAX PO) Take 17 g by mouth at bedtime.   . polyvinyl alcohol (LIQUIFILM TEARS) 1.4 % ophthalmic solution Place 1 drop into both eyes as needed for dry eyes.  Marland Kitchen spironolactone (ALDACTONE) 25 MG tablet Take 0.5-1 tablets (12.5-25 mg total) by mouth as needed (for swelling).  . imiquimod (ALDARA) 5 % cream Apply topically 3 (three) times a week. (Patient not taking: Reported on 10/11/2014)  . [DISCONTINUED] chlorpheniramine-HYDROcodone ([place])  10-8 MG/5ML LQCR Take by mouth as needed.   . [DISCONTINUED] clindamycin (CLEOCIN) 150 MG capsule   . [DISCONTINUED] clindamycin (CLEOCIN) 300 MG capsule Take 1 capsule (300 mg total) by mouth 3 (three) times daily.  . [DISCONTINUED] HYDROcodone-acetaminophen (NORCO) 10-325 MG per tablet Take 1 tablet by mouth every 4 (four) hours as needed.  . [DISCONTINUED] levothyroxine (SYNTHROID, LEVOTHROID) 150 MCG tablet   . [DISCONTINUED] predniSONE (DELTASONE) 20 MG tablet    No facility-administered encounter medications on file as of 10/11/2014.    Allergy:  Allergies  Allergen Reactions  . Codeine Other (See Comments)    Hyper/ Hallucinations   . Demerol [Meperidine] Other (See Comments)    "Knocks me out"  . Diazepam Other (See Comments)    Extreme sedation  . Tape Hives and Other (See Comments)    "thick clear plastic tape" causes blisters  . Morphine And Related Itching and Rash    Per CRNA, pt tol Fentanyl IV for OR case, no issues reported to rec RN in PACU, no immediate PACU issues noted as well upon arrival  . Penicillins Hives and Swelling    Social Hx:   History   Social History  . Marital Status: Married    Spouse Name: N/A  . Number of Children: N/A  . Years of Education: N/A   Occupational History  . Not on file.   Social History Main Topics  . Smoking status: Former Smoker -- 2.00 packs/day for 41 years    Types: Cigarettes    Quit date: 06/05/1999  . Smokeless tobacco: Never Used  . Alcohol Use: No  . Drug Use: No  . Sexual Activity: Not on file   Other Topics Concern  . Not on file   Social History Narrative    Past Surgical Hx:  Past Surgical History  Procedure Laterality Date  . Foot surgery Left 02-01-2009    TRIPLE ARTHRODESIS  . Rotator cuff repair Right   . Incisional hernia repair  05/05/2011    Procedure: LAPAROSCOPIC INCISIONAL HERNIA;  Surgeon: Edward Jolly, MD;  Location: [facility];  Service: General;  Laterality: N/A;  LAPAROSCOPIC  REPAIR INCARCERATED INCISIONAL HERNIA  WITH MESH  . Bladder suspension  10/13/2011    Procedure: TRANSVAGINAL TAPE (TVT) PROCEDURE;  Surgeon: Delice Lesch, MD;  Location: [facility];  Service: Gynecology;  Laterality: N/A;  . Cystoscopy  10/13/2011    Procedure: CYSTOSCOPY;  Surgeon: Delice Lesch, MD;  Location: [facility];  Service: Gynecology;  Laterality: Bilateral;  . Vulvectomy  05/27/2012    Procedure: WIDE EXCISION VULVECTOMY;  Surgeon: Imagene Gurney A. Alycia Rossetti, MD;  Location: [facility];  Service: Gynecology;  Laterality: N/A;  Wide Local Excision of Vulva   . Vulvectomy N/A 11/23/2012  Procedure: WIDE LOCAL EXCISION OF THE VULVA;  Surgeon: Imagene Gurney A. Alycia Rossetti, MD;  Location: [facility];  Service: Gynecology;  Laterality: N/A;  left inferior vulvar biopsy excision left superior lesion left inferior vulvar excision  . Foot surgery Left 11/14    I&D left foot with woundvac placement  . Vulvectomy N/A 08/02/2013    Procedure: WIDE LOCAL  EXCISION VULVA;  Surgeon: Imagene Gurney A. Alycia Rossetti, MD;  Location: [facility];  Service: Gynecology;  Laterality: N/A;  . Tonsillectomy  1968  . Lumbar spine surgery  1998    L5 -- S1  . Dilation and curettage of uterus  1968  . Breast lumpectomy Left 03-27-2004  . Mastectomy Right 1997    W/  LYMPH NODE DISSECTION AND RECONSTRUCTION WITH IMPLANTS AND EXPANDER  . Thyroidectomy, partial  1983  . Tendon repair right ring finger  2011  . Laparoscopic cholecystectomy  06-21-2010  . Knee arthroscopy Bilateral left 01-1984 & 04-1992/   right [phone]  . Vulvectomy partial  07-16-2010  . Cardiac catheterization  10-30-2000  dr Wynonia Lawman    normal coronary arteries and LVF/  ef 65-70%  . Removal right breast implant and capsulectomy  06-03-1999  . Thumb trigger release Right 1993  . Total knee arthroplasty Bilateral right 05-04-2000/  left 03-29-2001  . Closed manipulation  w/ open reduction exchange tibial femoral bearing post total left knee  04-14-2001  . Revision total knee arthroplasty Left  07-07-2005  &  12-10-2001    12-10-2001  REMOVAL TOTAL KNEE/ I&D / INSERTION ANTIBIOTIC SPACER  . Revision total knee arthroplasty Right 07-07-2005  . Port-a-cath placement and removal  1997  . Vulvectomy Left 03/29/2014    Procedure: WIDE LOCAL EXCISION OF LEFT VULVA ;  Surgeon: Imagene Gurney A. Alycia Rossetti, MD;  Location: [facility];  Service: Gynecology;  Laterality: Left;    Past Medical Hx:  Past Medical History  Diagnosis Date  . Macular degeneration of right eye   . Seasonal allergies   . GERD (gastroesophageal reflux disease)   . History of breast cancer no recurrence    1997  right breast cancer  s/p  mastectomy w/ snl dissection and chemoradiation/  2005  left breast cancer DCIS  s/p  lumpectomy and radiation  . Depression   . Hypothyroidism   . Asthma   . Fibromyalgia   . Arthritis   . Wears glasses   . Anxiety   . Numbness of left foot     4 toes  . History of colon polyps     2007 hyperplagia  . History of esophageal dilatation     2008  . History of small bowel obstruction     2012  . History of peptic ulcer   . Paget's disease of vulva   . PONV (postoperative nausea and vomiting)     and HARD TO WAKE  . Chronic constipation   . SUI (stress urinary incontinence, female)   . At risk for sleep apnea     STOP-BANG= 4     SENT TO PCP 03-27-2014    Family Hx:  Family History  Problem Relation Age of Onset  . Cancer Father 26    lung and kidney   . Alzheimer's disease Mother   . Alcohol abuse Brother   . Heart attack Maternal Grandmother     Vitals:  Blood pressure 123/55, pulse 85, temperature 97.3 F (36.3 C), temperature source Oral, resp. rate 17, height 5\' 5"  (1.651 m), weight 223 lb (101.152  kg), SpO2 96 %.  Physical Exam: Well-nourished well-developed female in no acute distress.  Pelvic: External genitalia status post excision of the left labia. Small labia minora remains on the left. Lateral to this, there is a very small area consistent  with Paget's. It's probably less than the size of a pea. On the right side there is a larger patch that looks more consistent with trauma from scratching. However, secondary to the patient's history the decision was made to proceed with a vulvar biopsy.  After obtaining the patient's verbal consent the area was cleansed with Betadine. 1 cc of 1% lidocaine without epinephrine was injected with a tuberculin needle. Using a 3 mm punch biopsy a biopsy was obtained. Hemostasis was obtained with silver nitrate. She tolerated the procedure well.  Assessment/Plan: 42 -year-old with recurrent vulvar Paget's disease. We will follow-up in results of the biopsy from today notify her with the results. We'll determine her disposition pending the results. However, if the result shows Paget's disease I do believe field are helped the other side and will need to see we can help her identify resources to be able to get the Aldara cream. If she's not able to then unfortunately we'll need to proceed with excision in the operating room.  Arjan Strohm A., MD 10/11/2014, 11:25 AM

## 2014-10-11 NOTE — Patient Instructions (Signed)
We will contact you with the results of your biopsy from today then we will discuss the plan.  Please call for any questions or concerns.

## 2014-10-12 ENCOUNTER — Ambulatory Visit: Payer: Medicare Other | Admitting: Gynecologic Oncology

## 2014-10-13 ENCOUNTER — Telehealth: Payer: Self-pay | Admitting: Gynecologic Oncology

## 2014-10-13 NOTE — Telephone Encounter (Signed)
Returned call to patient.  Patient requesting biopsy results.  Informed the biopsy resulted Paget's.  Awaiting Dr. Elenora Gamma recommendations and the patient will be contacted at that time.  No concerns voiced.  Advised to call for any needs between that time.

## 2014-10-16 ENCOUNTER — Telehealth: Payer: Self-pay | Admitting: Gynecologic Oncology

## 2014-10-16 ENCOUNTER — Ambulatory Visit (INDEPENDENT_AMBULATORY_CARE_PROVIDER_SITE_OTHER): Payer: Medicare Other | Admitting: Podiatry

## 2014-10-16 DIAGNOSIS — Z9889 Other specified postprocedural states: Secondary | ICD-10-CM

## 2014-10-16 NOTE — Telephone Encounter (Signed)
Left message asking the patient to call the office to discuss treatment options per Dr. Alycia Rossetti for her recent biopsy resulting Paget's.

## 2014-10-16 NOTE — Telephone Encounter (Signed)
Patient returned call.  Biopsy results discussed along with Dr. Elenora Gamma recommendations for continuing the use of aldara cream or having the area excised.  Patient stating she would like to call the [facility] to see how much the aldara cream will cost.  Prescription given for aldara at last visit.  She is to call the office to discuss her decision on treatment.

## 2014-10-17 NOTE — Progress Notes (Signed)
Subjective:     Patient ID: [name], female   DOB: 1946/05/13, 69 y.o.   MRN: [id number]  HPI patient presents stating my left foot feels a lot better since the surgery. States she's having minimal discomfort or swelling   Review of Systems     Objective:   Physical Exam Neurovascular status intact muscle strength was adequate with well-healing surgical site plantar left foot with wound edges well coapted stitches intact and minimal discomfort upon palpation with no drainage or redness noted    Assessment:     Doing well post fibular sesamoidectomy left    Plan:     Reviewed condition and at this time reapplied sterile dressing to the left foot. Advised on elevation to be continued and reappoint to recheck

## 2014-11-20 ENCOUNTER — Other Ambulatory Visit: Payer: Self-pay

## 2014-11-29 ENCOUNTER — Encounter: Payer: Self-pay | Admitting: Gynecologic Oncology

## 2014-11-29 ENCOUNTER — Ambulatory Visit: Payer: Medicare Other | Attending: Gynecologic Oncology | Admitting: Gynecologic Oncology

## 2014-11-29 VITALS — BP 132/56 | HR 74 | Temp 98.6°F | Resp 20 | Ht 65.0 in | Wt 232.2 lb

## 2014-11-29 DIAGNOSIS — C4499 Other specified malignant neoplasm of skin, unspecified: Secondary | ICD-10-CM | POA: Diagnosis present

## 2014-11-29 MED ORDER — BETAMETHASONE VALERATE 0.1 % EX OINT: 1.0000 "application " | TOPICAL_OINTMENT | Freq: Two times a day (BID) | CUTANEOUS | Status: DC

## 2014-11-29 NOTE — Progress Notes (Signed)
Consult Note: Gyn-Onc  [name] 69 y.o. female  CC:  Chief Complaint  Patient presents with  . 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 Some of the surgical margins were positive. Her postoperative course was uncomplicated. She was last seen by our service in 12/14. At that time there was no evidence of any lesions consistent with Paget's. However, there is considerable amount of excoriations throughout the entire vulvar and medial thighs consistent with chronic vulvitis.In December 2013 she had a hernia repair due to bowel obstruction by Dr. Excell Seltzer with permanent mesh. In May 2013 she also had a TVT bladder by Dr. Everett Graff. She does continue to wear a pad and also takes Detrol twice daily but states that her urinary leakage is markedly improved. She described the vulvar irritation is intermittent itching with burning and that it has increased over time as has the pruritus. She does use a steroid cream twice daily and does scratch her vulva.   She underwent a wide local excision of the left vulva on January 2 of year 2014. Operative findings included 2 separate 3 x 3 and 1.2 cm lesions consistent with Paget's on the left.  Pathology revealed 2 foci of extramammary Paget's disease extending to the left inked margin in the right and anterior tip margins at multiple foci. The posterior margin was negative for malignancy.   I performed a biopsy of this it was consistent with recurrent Paget's disease. On 11/23/2012 showed a repeat wide local excision of liver x2 with the posterior fourchette biopsy. Findings revealed along the left labia minora/vagina is a Paget's disease measuring approximately 2.5 x 1 cm in the superior aspect of her prior left vulvar excision. The inferior aspect of a similar lesion. 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 On the left perineal body the area is somewhat excoriated pale. Biopsy was performed in this area.  Pathology revealed:  1. Vulva, biopsy, left inferior - EXTRAMAMMARY PAGET DISEASE EXTENDING TO THE EDGES OF THE BIOPSY. 2. Vulva, excision, left superior lesion suture marks 1200 - EXTRAMAMMARY PAGET DISEASE. - MARGINS NOT INVOLVED. - CLOSEST MARGIN 9 O'CLOCK, LESS THAN 0.1 CM. 3. Vulva, excision, left inferior excision suture marks 1200 - EXTRAMAMMARY PAGET DISEASE, 12 O'CLOCK MARGIN FOCALLY INVOLVED. - REMAINING MARGINS CLEAR.  We have discussed repeating surgery however she had multiple orthopedic issues had broken ankle. She was last seen by [name] in December 2014. Exam at that time was fairly unremarkable.  I had last seen her in January of 2015. At that time exam was concerning for Paget's.   On 08/02/2013 she underwent wide local excisions of the vulva x2.  Operative findings included pale pink raised lesion measuring 1 x 0.5 cm the left upper vulva near the prior excision site. There is a pale raised lesion measuring 1.5 x 1 cm on the left crossing midline perineal body.  Pathology revealed left superior vulvar excision with extramammary Paget's disease extending to the 9:00 margin. The other margins were negative for tumor. Of the left of midline lesion she extramammary Paget's disease extending to the 12:00 margin and focally at the 6:00 margin but otherwise negative margins. The patient at that time discussed with [place] that she was having some right lower quadrant pain. Her exam is very difficult secondary to habitus. There ultrasound  was obtained. Ultrasound revealed:  FINDINGS:  UterusMeasurements: 4.6 x 3.5 x 3.8 cm. 2.5 x 1.7 x 1.6 cm uterine fibroid. This is present in the right lateral aspect of the body uterus.  Endometrium Thickness: 6.1 mm. No focal abnormality visualized.  Right ovary: No focal abnormality.  Left ovary :No focal abnormality.  Other  findings: No free fluid  IMPRESSION:  Uterine fibroid otherwise negative exam.  I saw her March 16, 2014 at which time there was an area on the left vulva with hyperkeratosis. A biopsy of that area was performed consistent with Paget's disease. On March 29, 2014 she underwent wide local excision of the vulva. On exam she had a 1 cm left vulvar lesion just below the left labia minora. She underwent a wide local excision. He received a 3.7 x 1.7 x 0.3 cm lesion. There was extramammary Paget's disease extending to the 6 to 9:00 margin in the 12:00 margin. This is despite grossly negative margins on physical examination.   She had an excision in November that revealed Diagnosis Vulva, excision, w/stitch @ 1200 - EXTRAMAMMARY PAGET'S DISEASE, EXTENDING TO THE 6-9 O'CLOCK MARGIN AND 12 O'CLOCK PERPENDICULAR MARGIN.  I saw her in February at which time there was concern for recurrent disease. She decided to proceed with Aldara therapy. She used for approximate 4 weeks and then decided to stop it secondary to expensive medication. She then started using the clobetasol. While she was using the Aldara all the pruritus in her vulva resolved itself. Since she stopped it and was using the steroid cream the pruritus returned.  I saw her in May at which time she has small area consistent with Paget's on the right labia minora and a larger patch on the left. Biopsy on the right revealed extramammary Paget's. After discussion she wished to try the Aldara again. She called and requested an earlier appointment as for the last 2-3 weeks had increased itching and has been scratching so much that she's had some bleeding. She is "digging in".  Current Meds:  Outpatient Encounter Prescriptions as of 11/29/2014  Medication Sig  . acetaminophen (TYLENOL) 500 MG tablet Take 500 mg by mouth every 6 (six) hours as needed for pain.  Marland Kitchen albuterol (PROVENTIL HFA;VENTOLIN HFA) 108 (90 BASE) MCG/ACT inhaler Inhale 1 puff into  the lungs every 6 (six) hours as needed for wheezing or shortness of breath.  . beta carotene w/minerals (OCUVITE) tablet Take 1 tablet by mouth daily.  . budesonide-formoterol (SYMBICORT) 160-4.5 MCG/ACT inhaler Inhale 2 puffs into the lungs as needed.   Marland Kitchen buPROPion (WELLBUTRIN XL) 150 MG 24 hr tablet Take 150 mg by mouth every morning.   . Calcium Carbonate-Vitamin D 600-125 MG-UNIT TABS Take 1 tablet by mouth daily.   . citalopram (CELEXA) 40 MG tablet Take 40 mg by mouth every morning.   . clindamycin (CLEOCIN) 300 MG capsule Take 1,200 mg by mouth as directed. Only when going to the dentist, due to hardware in left leg  . clotrimazole-betamethasone (LOTRISONE) cream Apply topically 2 (two) times daily. (Patient taking differently: Apply topically 2 (two) times daily as needed. )  . docusate sodium (COLACE) 100 MG capsule Take 100 mg by mouth daily.   . fluticasone (FLOVENT HFA) 44 MCG/ACT inhaler Inhale 2 puffs into the lungs as needed.   . imiquimod (ALDARA) 5 % cream Apply topically 3 (three) times a week.  Marland Kitchen ipratropium-albuterol (DUONEB) 0.5-2.5 (3) MG/3ML SOLN 3 mLs as needed.   Marland Kitchen levothyroxine (SYNTHROID, LEVOTHROID)  175 MCG tablet Take 175 mcg by mouth every morning.   . montelukast (SINGULAIR) 10 MG tablet Take 10 mg by mouth at bedtime.  . Multiple Vitamins-Minerals (MULTIVITAMIN GUMMIES ADULT) CHEW Chew 2 tablets by mouth daily.  . Omega-3 1000 MG CAPS Take 1 g by mouth daily.   . pantoprazole (PROTONIX) 40 MG tablet Take 40 mg by mouth every morning.   . Polyethylene Glycol 3350 (MIRALAX PO) Take 17 g by mouth at bedtime.   . polyvinyl alcohol (LIQUIFILM TEARS) 1.4 % ophthalmic solution Place 1 drop into both eyes as needed for dry eyes.  Marland Kitchen spironolactone (ALDACTONE) 25 MG tablet Take 0.5-1 tablets (12.5-25 mg total) by mouth as needed (for swelling).  . [DISCONTINUED] HYDROcodone-acetaminophen (NORCO/VICODIN) 5-325 MG per tablet Take 1-2 tablets by mouth every 6 (six) hours as  needed for moderate pain.   No facility-administered encounter medications on file as of 11/29/2014.    Allergy:  Allergies  Allergen Reactions  . Codeine Other (See Comments)    Hyper/ Hallucinations   . Demerol [Meperidine] Other (See Comments)    "Knocks me out"  . Diazepam Other (See Comments)    Extreme sedation  . Tape Hives and Other (See Comments)    "thick clear plastic tape" causes blisters  . Morphine And Related Itching and Rash    Per CRNA, pt tol Fentanyl IV for OR case, no issues reported to rec RN in PACU, no immediate PACU issues noted as well upon arrival  . Penicillins Hives and Swelling    Social Hx:   History   Social History  . Marital Status: Married    Spouse Name: N/A  . Number of Children: N/A  . Years of Education: N/A   Occupational History  . Not on file.   Social History Main Topics  . Smoking status: Former Smoker -- 2.00 packs/day for 41 years    Types: Cigarettes    Quit date: 06/05/1999  . Smokeless tobacco: Never Used  . Alcohol Use: No  . Drug Use: No  . Sexual Activity: Not on file   Other Topics Concern  . Not on file   Social History Narrative    Past Surgical Hx:  Past Surgical History  Procedure Laterality Date  . Foot surgery Left 02-01-2009    TRIPLE ARTHRODESIS  . Rotator cuff repair Right   . Incisional hernia repair  05/05/2011    Procedure: LAPAROSCOPIC INCISIONAL HERNIA;  Surgeon: Edward Jolly, MD;  Location: [facility];  Service: General;  Laterality: N/A;  LAPAROSCOPIC REPAIR INCARCERATED INCISIONAL HERNIA  WITH MESH  . Bladder suspension  10/13/2011    Procedure: TRANSVAGINAL TAPE (TVT) PROCEDURE;  Surgeon: Delice Lesch, MD;  Location: [facility];  Service: Gynecology;  Laterality: N/A;  . Cystoscopy  10/13/2011    Procedure: CYSTOSCOPY;  Surgeon: Delice Lesch, MD;  Location: [facility];  Service: Gynecology;  Laterality: Bilateral;  . Vulvectomy  05/27/2012    Procedure: WIDE EXCISION VULVECTOMY;  Surgeon:  Imagene Gurney A. Alycia Rossetti, MD;  Location: [facility];  Service: Gynecology;  Laterality: N/A;  Wide Local Excision of Vulva   . Vulvectomy N/A 11/23/2012    Procedure: WIDE LOCAL EXCISION OF THE VULVA;  Surgeon: Imagene Gurney A. Alycia Rossetti, MD;  Location: [facility];  Service: Gynecology;  Laterality: N/A;  left inferior vulvar biopsy excision left superior lesion left inferior vulvar excision  . Foot surgery Left 11/14    I&D left foot with woundvac placement  . Vulvectomy N/A 08/02/2013  Procedure: WIDE LOCAL  EXCISION VULVA;  Surgeon: Imagene Gurney A. Alycia Rossetti, MD;  Location: [facility];  Service: Gynecology;  Laterality: N/A;  . Tonsillectomy  1968  . Lumbar spine surgery  1998    L5 -- S1  . Dilation and curettage of uterus  1968  . Breast lumpectomy Left 03-27-2004  . Mastectomy Right 1997    W/  LYMPH NODE DISSECTION AND RECONSTRUCTION WITH IMPLANTS AND EXPANDER  . Thyroidectomy, partial  1983  . Tendon repair right ring finger  2011  . Laparoscopic cholecystectomy  06-21-2010  . Knee arthroscopy Bilateral left 01-1984 & 04-1992/   right [phone]  . Vulvectomy partial  07-16-2010  . Cardiac catheterization  10-30-2000  dr Wynonia Lawman    normal coronary arteries and LVF/  ef 65-70%  . Removal right breast implant and capsulectomy  06-03-1999  . Thumb trigger release Right 1993  . Total knee arthroplasty Bilateral right 05-04-2000/  left 03-29-2001  . Closed manipulation  w/ open reduction exchange tibial femoral bearing post total left knee  04-14-2001  . Revision total knee arthroplasty Left 07-07-2005  &  12-10-2001    12-10-2001  REMOVAL TOTAL KNEE/ I&D / INSERTION ANTIBIOTIC SPACER  . Revision total knee arthroplasty Right 07-07-2005  . Port-a-cath placement and removal  1997  . Vulvectomy Left 03/29/2014    Procedure: WIDE LOCAL EXCISION OF LEFT VULVA ;  Surgeon: Imagene Gurney A. Alycia Rossetti, MD;  Location: [facility];  Service: Gynecology;  Laterality: Left;    Past Medical Hx:  Past Medical History  Diagnosis Date   . Macular degeneration of right eye   . Seasonal allergies   . GERD (gastroesophageal reflux disease)   . History of breast cancer no recurrence    1997  right breast cancer  s/p  mastectomy w/ snl dissection and chemoradiation/  2005  left breast cancer DCIS  s/p  lumpectomy and radiation  . Depression   . Hypothyroidism   . Asthma   . Fibromyalgia   . Arthritis   . Wears glasses   . Anxiety   . Numbness of left foot     4 toes  . History of colon polyps     2007 hyperplagia  . History of esophageal dilatation     2008  . History of small bowel obstruction     2012  . History of peptic ulcer   . Paget's disease of vulva   . PONV (postoperative nausea and vomiting)     and HARD TO WAKE  . Chronic constipation   . SUI (stress urinary incontinence, female)   . At risk for sleep apnea     STOP-BANG= 4     SENT TO PCP 03-27-2014    Family Hx:  Family History  Problem Relation Age of Onset  . Cancer Father 27    lung and kidney   . Alzheimer's disease Mother   . Alcohol abuse Brother   . Heart attack Maternal Grandmother     Vitals:  Blood pressure 132/56, pulse 74, temperature 98.6 F (37 C), temperature source Oral, resp. rate 20, height 5\' 5"  (1.651 m), weight 232 lb 3.2 oz (105.325 kg), SpO2 97 %.  Physical Exam: Well-nourished well-developed female in no acute distress.  Pelvic: External genitalia status post excision of the left labia. There is extensive irritation of the vulva is very difficult to tell what could be Paget's, but could be inflammation from the Aldara cream as well as trauma related.  Assessment/Plan: 28 -  year-old with recurrent vulvar Paget's disease. She has recently been compliant with the Eligard and has significant pruritus. Is difficult to tell whether could be related to the outdoor versus what is related to her scratching. She will stop the Eligard at this time I will try low-dose betamethasone cream to see if that helps. She'll return to  see me in 4 weeks to evaluate the status of the vulva. She was encouraged to stop scratching if at all possible.  Baruc Tugwell A., MD 11/29/2014, 12:25 PM

## 2014-12-07 ENCOUNTER — Emergency Department (HOSPITAL_COMMUNITY): Payer: Medicare Other

## 2014-12-07 ENCOUNTER — Encounter (HOSPITAL_COMMUNITY): Payer: Self-pay | Admitting: Emergency Medicine

## 2014-12-07 ENCOUNTER — Emergency Department (HOSPITAL_COMMUNITY)
Admission: EM | Admit: 2014-12-07 | Discharge: 2014-12-07 | Disposition: A | Discharge status: Home / Self Care | Payer: Medicare Other | Attending: Emergency Medicine | Admitting: Emergency Medicine

## 2014-12-07 DIAGNOSIS — Z79899 Other long term (current) drug therapy: Secondary | ICD-10-CM | POA: Insufficient documentation

## 2014-12-07 DIAGNOSIS — Z8711 Personal history of peptic ulcer disease: Secondary | ICD-10-CM | POA: Diagnosis not present

## 2014-12-07 DIAGNOSIS — Z880 Allergy status to penicillin: Secondary | ICD-10-CM | POA: Diagnosis not present

## 2014-12-07 DIAGNOSIS — Z86010 Personal history of colonic polyps: Secondary | ICD-10-CM | POA: Insufficient documentation

## 2014-12-07 DIAGNOSIS — E039 Hypothyroidism, unspecified: Secondary | ICD-10-CM | POA: Diagnosis not present

## 2014-12-07 DIAGNOSIS — S300XXA Contusion of lower back and pelvis, initial encounter: Secondary | ICD-10-CM

## 2014-12-07 DIAGNOSIS — M797 Fibromyalgia: Secondary | ICD-10-CM | POA: Diagnosis not present

## 2014-12-07 DIAGNOSIS — Z973 Presence of spectacles and contact lenses: Secondary | ICD-10-CM | POA: Diagnosis not present

## 2014-12-07 DIAGNOSIS — Z853 Personal history of malignant neoplasm of breast: Secondary | ICD-10-CM | POA: Insufficient documentation

## 2014-12-07 DIAGNOSIS — Y999 Unspecified external cause status: Secondary | ICD-10-CM | POA: Insufficient documentation

## 2014-12-07 DIAGNOSIS — Y939 Activity, unspecified: Secondary | ICD-10-CM | POA: Diagnosis not present

## 2014-12-07 DIAGNOSIS — J45909 Unspecified asthma, uncomplicated: Secondary | ICD-10-CM | POA: Diagnosis not present

## 2014-12-07 DIAGNOSIS — F329 Major depressive disorder, single episode, unspecified: Secondary | ICD-10-CM | POA: Insufficient documentation

## 2014-12-07 DIAGNOSIS — Z87891 Personal history of nicotine dependence: Secondary | ICD-10-CM | POA: Diagnosis not present

## 2014-12-07 DIAGNOSIS — F419 Anxiety disorder, unspecified: Secondary | ICD-10-CM | POA: Insufficient documentation

## 2014-12-07 DIAGNOSIS — Y92512 Supermarket, store or market as the place of occurrence of the external cause: Secondary | ICD-10-CM | POA: Diagnosis not present

## 2014-12-07 DIAGNOSIS — S79911A Unspecified injury of right hip, initial encounter: Secondary | ICD-10-CM | POA: Diagnosis present

## 2014-12-07 DIAGNOSIS — K219 Gastro-esophageal reflux disease without esophagitis: Secondary | ICD-10-CM | POA: Insufficient documentation

## 2014-12-07 DIAGNOSIS — W1839XA Other fall on same level, initial encounter: Secondary | ICD-10-CM | POA: Insufficient documentation

## 2014-12-07 DIAGNOSIS — Z87448 Personal history of other diseases of urinary system: Secondary | ICD-10-CM | POA: Diagnosis not present

## 2014-12-07 DIAGNOSIS — Z85828 Personal history of other malignant neoplasm of skin: Secondary | ICD-10-CM | POA: Insufficient documentation

## 2014-12-07 DIAGNOSIS — W19XXXA Unspecified fall, initial encounter: Secondary | ICD-10-CM

## 2014-12-07 MED ORDER — ONDANSETRON 4 MG PO TBDP
4.0000 mg | ORAL_TABLET | Freq: Once | ORAL | Status: AC
  Administered 2014-12-07: 4 mg via ORAL
  Filled 2014-12-07: qty 1

## 2014-12-07 MED ORDER — OXYCODONE-ACETAMINOPHEN 5-325 MG PO TABS
1.0000 | ORAL_TABLET | Freq: Once | ORAL | Status: AC
  Administered 2014-12-07: 1 via ORAL
  Filled 2014-12-07: qty 1

## 2014-12-07 NOTE — ED Notes (Signed)
Patient went to X-rays.

## 2014-12-07 NOTE — ED Notes (Signed)
Bed: VX79 Expected date:  Expected time:  Means of arrival:  Comments: EMS- 69yo F, lost balance and fell

## 2014-12-07 NOTE — ED Notes (Signed)
Patient was able to ambulate in the hallway with some assistance.  She had pain in her tailbone and hip.

## 2014-12-07 NOTE — Discharge Instructions (Signed)
Contusion °A contusion is a deep bruise. Contusions are the result of an injury that caused bleeding under the skin. The contusion may turn blue, purple, or yellow. Minor injuries will give you a painless contusion, but more severe contusions may stay painful and swollen for a few weeks.  °CAUSES  °A contusion is usually caused by a blow, trauma, or direct force to an area of the body. °SYMPTOMS  °· Swelling and redness of the injured area. °· Bruising of the injured area. °· Tenderness and soreness of the injured area. °· Pain. °DIAGNOSIS  °The diagnosis can be made by taking a history and physical exam. An X-ray, CT scan, or MRI may be needed to determine if there were any associated injuries, such as fractures. °TREATMENT  °Specific treatment will depend on what area of the body was injured. In general, the best treatment for a contusion is resting, icing, elevating, and applying cold compresses to the injured area. Over-the-counter medicines may also be recommended for pain control. Ask your caregiver what the best treatment is for your contusion. °HOME CARE INSTRUCTIONS  °· Put ice on the injured area. °¨ Put ice in a plastic bag. °¨ Place a towel between your skin and the bag. °¨ Leave the ice on for 15-20 minutes, 3-4 times a day, or as directed by your health care provider. °· Only take over-the-counter or prescription medicines for pain, discomfort, or fever as directed by your caregiver. Your caregiver may recommend avoiding anti-inflammatory medicines (aspirin, ibuprofen, and naproxen) for 48 hours because these medicines may increase bruising. °· Rest the injured area. °· If possible, elevate the injured area to reduce swelling. °SEEK IMMEDIATE MEDICAL CARE IF:  °· You have increased bruising or swelling. °· You have pain that is getting worse. °· Your swelling or pain is not relieved with medicines. °MAKE SURE YOU:  °· Understand these instructions. °· Will watch your condition. °· Will get help right  away if you are not doing well or get worse. °Document Released: 02/19/2005 Document Revised: 05/17/2013 Document Reviewed: 03/17/2011 °[employer]® Patient Information ©2015 [employer]. This information is not intended to replace advice given to you by your health care provider. Make sure you discuss any questions you have with your health care provider. ° °

## 2014-12-07 NOTE — ED Notes (Signed)
Patient is alert and orientated.  She is ambulatory and understands discharge instructions.  She complains of no N/V or dizzy spells.

## 2014-12-07 NOTE — ED Provider Notes (Signed)
CSN: [id number]     Arrival date & time 12/07/14  1016 History   First MD Initiated Contact with Patient 12/07/14 1018     Chief Complaint  Patient presents with  . Fall  . Hip Pain  . Tailbone Pain     (Consider location/radiation/quality/duration/timing/severity/associated sxs/prior Treatment) HPI    PCP: SPEAR, TAMMY, MD Blood pressure 140/69, pulse 73, temperature 97.9 F (36.6 C), temperature source Oral, SpO2 96 %.  [name] is a 69 y.o.female with a significant PMH of depression, hypothyroidism, asthma, fibromyalgia, arthritis, numbness of foot, seasonal allergies, hypothyroidism,  presents to the ER with complaints of fall. The patient was at [facility] and attempting to pool a shopping cart apart from another shopping cart when it abruptly became loose causing her to fly backwards onto concrete. She reports hitting her tailbone on the edge of the stair in her right hip on the ground as well as her right elbow. She has an abrasion to her right elbow. She has not tried to stand up and ambulate, EMS was called and she was transported to the emergency department. She has history of bilateral knee replacement surgery. SHe is not on blood thinner but does believe she hit her head on the right side. No laceration, tenderness, or pain. Denies headache, loc or neck pain.  The patient denies diaphoresis, fever, headache, weakness (general or focal), confusion, change of vision,  neck pain, dysphagia, aphagia, chest pain, shortness of breath,  back pain, abdominal pains, nausea, vomiting, diarrhea, lower extremity swelling, rash.   Past Medical History  Diagnosis Date  . Macular degeneration of right eye   . Seasonal allergies   . GERD (gastroesophageal reflux disease)   . History of breast cancer no recurrence    1997  right breast cancer  s/p  mastectomy w/ snl dissection and chemoradiation/  2005  left breast cancer DCIS  s/p  lumpectomy and radiation  . Depression   .  Hypothyroidism   . Asthma   . Fibromyalgia   . Arthritis   . Wears glasses   . Anxiety   . Numbness of left foot     4 toes  . History of colon polyps     2007 hyperplagia  . History of esophageal dilatation     2008  . History of small bowel obstruction     2012  . History of peptic ulcer   . Paget's disease of vulva   . PONV (postoperative nausea and vomiting)     and HARD TO WAKE  . Chronic constipation   . SUI (stress urinary incontinence, female)   . At risk for sleep apnea     STOP-BANG= 4     SENT TO PCP 03-27-2014   Past Surgical History  Procedure Laterality Date  . Foot surgery Left 02-01-2009    TRIPLE ARTHRODESIS  . Rotator cuff repair Right   . Incisional hernia repair  05/05/2011    Procedure: LAPAROSCOPIC INCISIONAL HERNIA;  Surgeon: Edward Jolly, MD;  Location: [facility];  Service: General;  Laterality: N/A;  LAPAROSCOPIC REPAIR INCARCERATED INCISIONAL HERNIA  WITH MESH  . Bladder suspension  10/13/2011    Procedure: TRANSVAGINAL TAPE (TVT) PROCEDURE;  Surgeon: Delice Lesch, MD;  Location: [facility];  Service: Gynecology;  Laterality: N/A;  . Cystoscopy  10/13/2011    Procedure: CYSTOSCOPY;  Surgeon: Delice Lesch, MD;  Location: [facility];  Service: Gynecology;  Laterality: Bilateral;  . Vulvectomy  05/27/2012  Procedure: WIDE EXCISION VULVECTOMY;  Surgeon: Lucita Lora. Alycia Rossetti, MD;  Location: [facility];  Service: Gynecology;  Laterality: N/A;  Wide Local Excision of Vulva   . Vulvectomy N/A 11/23/2012    Procedure: WIDE LOCAL EXCISION OF THE VULVA;  Surgeon: Imagene Gurney A. Alycia Rossetti, MD;  Location: [facility];  Service: Gynecology;  Laterality: N/A;  left inferior vulvar biopsy excision left superior lesion left inferior vulvar excision  . Foot surgery Left 11/14    I&D left foot with woundvac placement  . Vulvectomy N/A 08/02/2013    Procedure: WIDE LOCAL  EXCISION VULVA;  Surgeon: Imagene Gurney A. Alycia Rossetti, MD;  Location: [facility];  Service: Gynecology;  Laterality: N/A;  .  Tonsillectomy  1968  . Lumbar spine surgery  1998    L5 -- S1  . Dilation and curettage of uterus  1968  . Breast lumpectomy Left 03-27-2004  . Mastectomy Right 1997    W/  LYMPH NODE DISSECTION AND RECONSTRUCTION WITH IMPLANTS AND EXPANDER  . Thyroidectomy, partial  1983  . Tendon repair right ring finger  2011  . Laparoscopic cholecystectomy  06-21-2010  . Knee arthroscopy Bilateral left 01-1984 & 04-1992/   right [id number]  . Vulvectomy partial  07-16-2010  . Cardiac catheterization  10-30-2000  dr Wynonia Lawman    normal coronary arteries and LVF/  ef 65-70%  . Removal right breast implant and capsulectomy  06-03-1999  . Thumb trigger release Right 1993  . Total knee arthroplasty Bilateral right 05-04-2000/  left 03-29-2001  . Closed manipulation  w/ open reduction exchange tibial femoral bearing post total left knee  04-14-2001  . Revision total knee arthroplasty Left 07-07-2005  &  12-10-2001    12-10-2001  REMOVAL TOTAL KNEE/ I&D / INSERTION ANTIBIOTIC SPACER  . Revision total knee arthroplasty Right 07-07-2005  . Port-a-cath placement and removal  1997  . Vulvectomy Left 03/29/2014    Procedure: WIDE LOCAL EXCISION OF LEFT VULVA ;  Surgeon: Imagene Gurney A. Alycia Rossetti, MD;  Location: [facility];  Service: Gynecology;  Laterality: Left;   Family History  Problem Relation Age of Onset  . Cancer Father 39    lung and kidney   . Alzheimer's disease Mother   . Alcohol abuse Brother   . Heart attack Maternal Grandmother    History  Substance Use Topics  . Smoking status: Former Smoker -- 2.00 packs/day for 41 years    Types: Cigarettes    Quit date: 06/05/1999  . Smokeless tobacco: Never Used  . Alcohol Use: No   OB History    Gravida Para Term Preterm AB TAB SAB Ectopic Multiple Living   1 0             Review of Systems  10 Systems reviewed and are negative for acute change except as noted in the HPI.     Allergies  Codeine; Demerol; Diazepam; Tape; Morphine and  related; and Penicillins  Home Medications   Prior to Admission medications   Medication Sig Start Date End Date Taking? Authorizing Provider  beta carotene w/minerals (OCUVITE) tablet Take 1 tablet by mouth daily.   Yes Historical Provider, MD  betamethasone valerate (VALISONE) 0.1 % cream Apply 0.1 application topically daily. 11/29/14  Yes Historical Provider, MD  betamethasone valerate ointment (VALISONE) 0.1 % Apply 1 application topically 2 (two) times daily. 11/29/14  Yes [name], MD  buPROPion (WELLBUTRIN XL) 150 MG 24 hr tablet Take 150 mg by mouth every morning.    Yes Historical Provider, MD  Calcium Carbonate-Vitamin D 600-125 MG-UNIT TABS Take 1 tablet by mouth daily.    Yes Historical Provider, MD  citalopram (CELEXA) 40 MG tablet Take 40 mg by mouth every morning.    Yes Historical Provider, MD  docusate sodium (COLACE) 100 MG capsule Take 100 mg by mouth daily.    Yes Historical Provider, MD  levothyroxine (SYNTHROID, LEVOTHROID) 150 MCG tablet Take 150 mcg by mouth daily. 09/11/14  Yes Historical Provider, MD  Multiple Vitamins-Minerals (MULTIVITAMIN GUMMIES ADULT) CHEW Chew 2 tablets by mouth daily.   Yes Historical Provider, MD  Omega-3 1000 MG CAPS Take 1 g by mouth daily.    Yes Historical Provider, MD  pantoprazole (PROTONIX) 40 MG tablet Take 40 mg by mouth every morning.    Yes Historical Provider, MD  polyvinyl alcohol (LIQUIFILM TEARS) 1.4 % ophthalmic solution Place 1 drop into both eyes as needed for dry eyes.   Yes Historical Provider, MD  spironolactone (ALDACTONE) 25 MG tablet Take 0.5-1 tablets (12.5-25 mg total) by mouth as needed (for swelling). 08/31/13  Yes Melissa D Cross, NP  clotrimazole-betamethasone (LOTRISONE) cream Apply topically 2 (two) times daily. Patient not taking: Reported on 12/07/2014 12/22/12   Dorothyann Gibbs, NP  imiquimod (ALDARA) 5 % cream Apply topically 3 (three) times a week. Patient not taking: Reported on 12/07/2014 10/11/14   [name], MD   BP 140/69 mmHg  Pulse 73  Temp(Src) 97.9 F (36.6 C) (Oral)  SpO2 96% Physical Exam  Constitutional: She appears well-developed and well-nourished. No distress.  HENT:  Head: Normocephalic and atraumatic. Head is without raccoon's eyes, without Battle's sign, without abrasion, without contusion, without laceration, without right periorbital erythema and without left periorbital erythema.  Right Ear: No hemotympanum.  Left Ear: No hemotympanum.  Nose: Nose normal.  Mouth/Throat: Uvula is midline and oropharynx is clear and moist.  Eyes: Conjunctivae and EOM are normal. Pupils are equal, round, and reactive to light.  Neck: Normal range of motion. Neck supple. No spinous process tenderness and no muscular tenderness present.  Cardiovascular: Normal rate and regular rhythm.   Pulmonary/Chest: Effort normal. She has no decreased breath sounds. She exhibits no tenderness, no bony tenderness, no crepitus and no retraction.  No chest tenderness  Abdominal: Soft. Bowel sounds are normal. There is no tenderness. There is no guarding.  No abdominal wall tenderness  Musculoskeletal:       Right hip: She exhibits decreased range of motion.  Pt has equal strength to bilateral lower extremities.  Neurosensory function adequate to both legs Skin color is normal. Skin is warm and moist.  I see no step off deformity, no midline bony tenderness.  Pt is able to ambulate.  No crepitus, laceration, effusion, induration, lesions, swelling.   Pedal pulses are symmetrical and palpable bilaterally   tenderness to palpation of the sacrum and to the right hip.  Neurological: She is alert.  Skin: Skin is warm and dry.  Psychiatric: Her speech is normal.  Nursing note and vitals reviewed.   ED Course  Procedures (including critical care time) Labs Review Labs Reviewed - No data to display  Imaging Review Dg Sacrum/coccyx  12/07/2014   CLINICAL DATA:  Acute tailbone pain after fall at  [employer]. Initial encounter.  EXAM: SACRUM AND COCCYX - 2+ VIEW  COMPARISON:  None.  FINDINGS: There is no evidence of fracture or or dislocation seen in the sacrum or coccyx. Multilevel degenerative disc disease is noted in the visualized lumbar spine.  IMPRESSION:  No definite abnormality seen in the sacrum or coccyx.   Electronically Signed   By: Marijo Conception, M.D.   On: 12/07/2014 11:56   Dg Hip Unilat With Pelvis 2-3 Views Right  12/07/2014   CLINICAL DATA:  Fall at [place] this morning, right hip pain  EXAM: DG HIP (WITH OR WITHOUT PELVIS) 2-3V RIGHT  COMPARISON:  None.  FINDINGS: Three views of the right hip submitted. No acute fracture or subluxation. Mild degenerative changes bilateral hip joints with mild narrowing of superior hip joint space. Mild degenerative changes pubic symphysis.  IMPRESSION: No acute fracture or subluxation.  Mild degenerative changes.   Electronically Signed   By: Lahoma Crocker M.D.   On: 12/07/2014 11:46     EKG Interpretation None      MDM   Final diagnoses:  Fall, initial encounter  Sacral contusion, initial encounter    Patients exam is reassuring, her xrays are negative for fracture. She is able to bear weight to bilateral legs and ambulate with some soreness. Dr. Regenia Skeeter has seen patient as well and agrees that she is safe for discharge. She can follow-up with provided ortho referral or her PCP.  Medications  oxyCODONE-acetaminophen (PERCOCET/ROXICET) 5-325 MG per tablet 1 tablet (1 tablet Oral Given 12/07/14 1150)  ondansetron (ZOFRAN-ODT) disintegrating tablet 4 mg (4 mg Oral Given 12/07/14 1150)    69 y.o.Ji Fairburn Haste's evaluation in the Emergency Department is complete. It has been determined that no acute conditions requiring further emergency intervention are present at this time. The patient/guardian have been advised of the diagnosis and plan. We have discussed signs and symptoms that warrant return to the ED, such as changes or worsening  in symptoms.  Vital signs are stable at discharge. Filed Vitals:   12/07/14 1027  BP: 140/69  Pulse: 73  Temp: 97.9 F (36.6 C)    Patient/guardian has voiced understanding and agreed to follow-up with the PCP or specialist.     Delos Haring, PA-C 12/07/14 [place], MD 12/10/14 [phone]

## 2014-12-07 NOTE — ED Notes (Signed)
Per EMS pt comes from [facility] for falling after trying to pull a shopping cart loose from the others and when it gave she fell on back side.  Pt c/o pain on her right hip and tailbone.  No rotation or shortening noted.  Pt has abrasion to right elbow.  Pt denies LOC when she fell.

## 2014-12-27 ENCOUNTER — Encounter: Payer: Self-pay | Admitting: Gynecologic Oncology

## 2014-12-27 ENCOUNTER — Ambulatory Visit: Payer: Medicare Other | Attending: Gynecologic Oncology | Admitting: Gynecologic Oncology

## 2014-12-27 VITALS — BP 131/73 | HR 89 | Temp 98.2°F | Resp 20 | Ht 65.0 in | Wt 226.6 lb

## 2014-12-27 DIAGNOSIS — C4499 Other specified malignant neoplasm of skin, unspecified: Secondary | ICD-10-CM | POA: Diagnosis present

## 2014-12-27 NOTE — Progress Notes (Signed)
Consult Note: Gyn-Onc  [name] 69 y.o. female  CC:  Chief Complaint  Patient presents with  . 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 Some of the surgical margins were positive. Her postoperative course was uncomplicated. She was last seen by our service in 12/14. At that time there was no evidence of any lesions consistent with Paget's. However, there is considerable amount of excoriations throughout the entire vulvar and medial thighs consistent with chronic vulvitis.In December 2013 she had a hernia repair due to bowel obstruction by Dr. Excell Seltzer with permanent mesh. In May 2013 she also had a TVT bladder by Dr. Everett Graff. She does continue to wear a pad and also takes Detrol twice daily but states that her urinary leakage is markedly improved. She described the vulvar irritation is intermittent itching with burning and that it has increased over time as has the pruritus. She does use a steroid cream twice daily and does scratch her vulva.   She underwent a wide local excision of the left vulva on January 2 of year 2014. Operative findings included 2 separate 3 x 3 and 1.2 cm lesions consistent with Paget's on the left.  Pathology revealed 2 foci of extramammary Paget's disease extending to the left inked margin in the right and anterior tip margins at multiple foci. The posterior margin was negative for malignancy.   I performed a biopsy of this it was consistent with recurrent Paget's disease. On 11/23/2012 showed a repeat wide local excision of liver x2 with the posterior fourchette biopsy. Findings revealed along the left labia minora/vagina is a Paget's disease measuring approximately 2.5 x 1 cm in the superior aspect of her prior left vulvar excision. The inferior aspect of a similar lesion. 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 On the left perineal body the area is somewhat excoriated pale. Biopsy was performed in this area.  Pathology revealed:  1. Vulva, biopsy, left inferior - EXTRAMAMMARY PAGET DISEASE EXTENDING TO THE EDGES OF THE BIOPSY. 2. Vulva, excision, left superior lesion suture marks 1200 - EXTRAMAMMARY PAGET DISEASE. - MARGINS NOT INVOLVED. - CLOSEST MARGIN 9 O'CLOCK, LESS THAN 0.1 CM. 3. Vulva, excision, left inferior excision suture marks 1200 - EXTRAMAMMARY PAGET DISEASE, 12 O'CLOCK MARGIN FOCALLY INVOLVED. - REMAINING MARGINS CLEAR.  We have discussed repeating surgery however she had multiple orthopedic issues had broken ankle. She was last seen by [name] in December 2014. Exam at that time was fairly unremarkable.  I had seen her in January of 2015. At that time exam was concerning for Paget's.   On 08/02/2013 she underwent wide local excisions of the vulva x2.  Operative findings included pale pink raised lesion measuring 1 x 0.5 cm the left upper vulva near the prior excision site. There is a pale raised lesion measuring 1.5 x 1 cm on the left crossing midline perineal body.  Pathology revealed left superior vulvar excision with extramammary Paget's disease extending to the 9:00 margin. The other margins were negative for tumor. Of the left of midline lesion she extramammary Paget's disease extending to the 12:00 margin and focally at the 6:00 margin but otherwise negative margins. The patient at that time discussed with [place] that she was having some right lower quadrant pain. Her exam is very difficult secondary to habitus. There ultrasound was  obtained. Ultrasound revealed:  FINDINGS:  Uterus Measurements: 4.6 x 3.5 x 3.8 cm. 2.5 x 1.7 x 1.6 cm uterine fibroid. This is present in the right lateral aspect of the body uterus.  Endometrium Thickness: 6.1 mm. No focal abnormality visualized.  Right ovary: No focal abnormality.  Left ovary :No focal abnormality.  Other  findings: No free fluid  IMPRESSION:  Uterine fibroid otherwise negative exam.  I saw her March 16, 2014 at which time there was an area on the left vulva with hyperkeratosis. A biopsy of that area was performed consistent with Paget's disease. On March 29, 2014 she underwent wide local excision of the vulva. On exam she had a 1 cm left vulvar lesion just below the left labia minora. She underwent a wide local excision. He received a 3.7 x 1.7 x 0.3 cm lesion. There was extramammary Paget's disease extending to the 6 to 9:00 margin in the 12:00 margin. This is despite grossly negative margins on physical examination.   She had an excision in November that revealed Diagnosis Vulva, excision, w/stitch @ 1200 - EXTRAMAMMARY PAGET'S DISEASE, EXTENDING TO THE 6-9 O'CLOCK MARGIN AND 12 O'CLOCK PERPENDICULAR MARGIN.  I saw her in February at which time there was concern for recurrent disease. She decided to proceed with Aldara therapy. She used for approximate 4 weeks and then decided to stop it secondary to expensive medication. She then started using the clobetasol. While she was using the Aldara all the pruritus in her vulva resolved itself. Since she stopped it and was using the steroid cream the pruritus returned.  I saw her in May at which time she has small area consistent with Paget's on the right labia minora and a larger patch on the left. Biopsy on the right revealed extramammary Paget's. After discussion she wished to try the Aldara again. She called and requested an earlier appointment as for the last 2-3 weeks had increased itching and has been scratching so much that she's had some bleeding. I last saw her on July 6. At that time she had been having a lot of symptoms of pruritus and scratching a great deal. Vulvar examination at that time revealed a fairly macerated vulva and it was possible to discern what could be related to Paget's, effect of the Aldara therapy, trauma from scratching.  She was asked to stop the Aldara therapy and use a low-dose daily cream. We asked her to refrain from scratching come in today for evaluation.  She states she was initially diagnosed with a sinus infection. Her husband was diagnosed with the flu. She was seen at the [facility] urgent care and was treated for a sinus infection and given Mucinex. She did not improve and was followed up ultimately by the PA at her 51 office who diagnosed her with bronchitis. She's on day 2 of a Z-Pak. She is no longer had fevers as her last one was a temperature of 102.4 on Thursday. She was told by her primary physician's office if she still had coughing this coming Monday to proceed with a chest x-ray. She is starting to feel a bit better but continues to have a productive cough. Current Meds:  Outpatient Encounter Prescriptions as of 12/27/2014  Medication Sig  . azithromycin (ZITHROMAX) 250 MG tablet Take two tablets on the first day and then one tablet every day after.  . beta carotene w/minerals (OCUVITE) tablet Take 1 tablet by mouth daily.  . betamethasone valerate ointment (VALISONE) 0.1 % Apply 1 application  topically 2 (two) times daily.  Marland Kitchen buPROPion (WELLBUTRIN XL) 150 MG 24 hr tablet Take 150 mg by mouth every morning.   . Calcium Carbonate-Vitamin D 600-125 MG-UNIT TABS Take 1 tablet by mouth daily.   . chlorpheniramine-HYDROcodone (TUSSIONEX PENNKINETIC ER) 10-8 MG/5ML SUER Take 5 mLs by mouth.  . citalopram (CELEXA) 40 MG tablet Take 40 mg by mouth every morning.   . clotrimazole-betamethasone (LOTRISONE) cream Apply topically 2 (two) times daily.  Marland Kitchen docusate sodium (COLACE) 100 MG capsule Take 100 mg by mouth daily.   Marland Kitchen levothyroxine (SYNTHROID, LEVOTHROID) 175 MCG tablet TAKE 1 TABLET (175 MCG TOTAL) BY MOUTH DAILY.  . Multiple Vitamins-Minerals (MULTIVITAMIN GUMMIES ADULT) CHEW Chew 2 tablets by mouth daily.  . Omega-3 1000 MG CAPS Take 1 g by mouth daily.   . pantoprazole (PROTONIX) 40 MG tablet  Take 40 mg by mouth every morning.   . polyvinyl alcohol (LIQUIFILM TEARS) 1.4 % ophthalmic solution Place 1 drop into both eyes as needed for dry eyes.  . promethazine-dextromethorphan (PROMETHAZINE-DM) 6.25-15 MG/5ML syrup TAKE 5 ML BY MOUTH 4 (FOUR) TIMES A DAY AS NEEDED FOR COUGH FOR UP TO 7 DAYS.  Marland Kitchen spironolactone (ALDACTONE) 25 MG tablet Take 0.5-1 tablets (12.5-25 mg total) by mouth as needed (for swelling).  . imiquimod (ALDARA) 5 % cream Apply topically 3 (three) times a week. (Patient not taking: Reported on 12/07/2014)  . [DISCONTINUED] betamethasone valerate (VALISONE) 0.1 % cream Apply 0.1 application topically daily.  . [DISCONTINUED] levothyroxine (SYNTHROID, LEVOTHROID) 150 MCG tablet Take 150 mcg by mouth daily.   No facility-administered encounter medications on file as of 12/27/2014.    Allergy:  Allergies  Allergen Reactions  . Codeine Other (See Comments)    Hyper/ Hallucinations   . Demerol [Meperidine] Other (See Comments)    "Knocks me out"  . Diazepam Other (See Comments)    Extreme sedation; patient does not recall taking diazepam (12/07/14)  . Tape Hives and Other (See Comments)    "thick clear plastic tape" causes blisters  . Morphine And Related Itching and Rash    Per CRNA, pt tol Fentanyl IV for OR case, no issues reported to rec RN in PACU, no immediate PACU issues noted as well upon arrival  . Penicillins Hives and Swelling    Social Hx:   History   Social History  . Marital Status: Married    Spouse Name: N/A  . Number of Children: N/A  . Years of Education: N/A   Occupational History  . Not on file.   Social History Main Topics  . Smoking status: Former Smoker -- 2.00 packs/day for 41 years    Types: Cigarettes    Quit date: 06/05/1999  . Smokeless tobacco: Never Used  . Alcohol Use: No  . Drug Use: No  . Sexual Activity: Not on file   Other Topics Concern  . Not on file   Social History Narrative    Past Surgical Hx:  Past  Surgical History  Procedure Laterality Date  . Foot surgery Left 02-01-2009    TRIPLE ARTHRODESIS  . Rotator cuff repair Right   . Incisional hernia repair  05/05/2011    Procedure: LAPAROSCOPIC INCISIONAL HERNIA;  Surgeon: Edward Jolly, MD;  Location: [facility];  Service: General;  Laterality: N/A;  LAPAROSCOPIC REPAIR INCARCERATED INCISIONAL HERNIA  WITH MESH  . Bladder suspension  10/13/2011    Procedure: TRANSVAGINAL TAPE (TVT) PROCEDURE;  Surgeon: Delice Lesch, MD;  Location: [facility];  Service:  Gynecology;  Laterality: N/A;  . Cystoscopy  10/13/2011    Procedure: CYSTOSCOPY;  Surgeon: Delice Lesch, MD;  Location: [facility];  Service: Gynecology;  Laterality: Bilateral;  . Vulvectomy  05/27/2012    Procedure: WIDE EXCISION VULVECTOMY;  Surgeon: Imagene Gurney A. Alycia Rossetti, MD;  Location: [facility];  Service: Gynecology;  Laterality: N/A;  Wide Local Excision of Vulva   . Vulvectomy N/A 11/23/2012    Procedure: WIDE LOCAL EXCISION OF THE VULVA;  Surgeon: Imagene Gurney A. Alycia Rossetti, MD;  Location: [facility];  Service: Gynecology;  Laterality: N/A;  left inferior vulvar biopsy excision left superior lesion left inferior vulvar excision  . Foot surgery Left 11/14    I&D left foot with woundvac placement  . Vulvectomy N/A 08/02/2013    Procedure: WIDE LOCAL  EXCISION VULVA;  Surgeon: Imagene Gurney A. Alycia Rossetti, MD;  Location: [facility];  Service: Gynecology;  Laterality: N/A;  . Tonsillectomy  1968  . Lumbar spine surgery  1998    L5 -- S1  . Dilation and curettage of uterus  1968  . Breast lumpectomy Left 03-27-2004  . Mastectomy Right 1997    W/  LYMPH NODE DISSECTION AND RECONSTRUCTION WITH IMPLANTS AND EXPANDER  . Thyroidectomy, partial  1983  . Tendon repair right ring finger  2011  . Laparoscopic cholecystectomy  06-21-2010  . Knee arthroscopy Bilateral left 01-1984 & 04-1992/   right [phone]  . Vulvectomy partial  07-16-2010  . Cardiac catheterization  10-30-2000  dr Wynonia Lawman    normal coronary arteries and LVF/  ef  65-70%  . Removal right breast implant and capsulectomy  06-03-1999  . Thumb trigger release Right 1993  . Total knee arthroplasty Bilateral right 05-04-2000/  left 03-29-2001  . Closed manipulation  w/ open reduction exchange tibial femoral bearing post total left knee  04-14-2001  . Revision total knee arthroplasty Left 07-07-2005  &  12-10-2001    12-10-2001  REMOVAL TOTAL KNEE/ I&D / INSERTION ANTIBIOTIC SPACER  . Revision total knee arthroplasty Right 07-07-2005  . Port-a-cath placement and removal  1997  . Vulvectomy Left 03/29/2014    Procedure: WIDE LOCAL EXCISION OF LEFT VULVA ;  Surgeon: Imagene Gurney A. Alycia Rossetti, MD;  Location: [facility];  Service: Gynecology;  Laterality: Left;    Past Medical Hx:  Past Medical History  Diagnosis Date  . Macular degeneration of right eye   . Seasonal allergies   . GERD (gastroesophageal reflux disease)   . History of breast cancer no recurrence    1997  right breast cancer  s/p  mastectomy w/ snl dissection and chemoradiation/  2005  left breast cancer DCIS  s/p  lumpectomy and radiation  . Depression   . Hypothyroidism   . Asthma   . Fibromyalgia   . Arthritis   . Wears glasses   . Anxiety   . Numbness of left foot     4 toes  . History of colon polyps     2007 hyperplagia  . History of esophageal dilatation     2008  . History of small bowel obstruction     2012  . History of peptic ulcer   . Paget's disease of vulva   . PONV (postoperative nausea and vomiting)     and HARD TO WAKE  . Chronic constipation   . SUI (stress urinary incontinence, female)   . At risk for sleep apnea     STOP-BANG= 4     SENT TO PCP 03-27-2014  Family Hx:  Family History  Problem Relation Age of Onset  . Cancer Father 29    lung and kidney   . Alzheimer's disease Mother   . Alcohol abuse Brother   . Heart attack Maternal Grandmother     Vitals:  Blood pressure 131/73, pulse 89, temperature 98.2 F (36.8 C), temperature source  Oral, resp. rate 20, height 5\' 5"  (1.651 m), weight 226 lb 9.6 oz (102.785 kg), SpO2 96 %.  Physical Exam: Well-nourished well-developed female in no acute distress.  Pelvic: External genitalia status post excision of the left labia. The vulva appears much more normal today. 3 clear to identify an area Paget's disease in the mid right mons measuring approximately 1 x 3 cm. It is well demarcated and patchy. On the left side near the remaining labia minora there is an area of Paget's disease. The excoriation and traumas markedly improved.  Assessment/Plan: 89 -year-old with recurrent vulvar Paget's disease. She has recently been compliant with the Aldara and has significant pruritus. She has stopped that topical therapy and started using a low-dose daily cream with significant improvement in her symptoms. The traumas improve significantly so that we can actually discern what is consistent with Paget's disease versus treatment versus excoriation from scratching. She would like to proceed with wide local excision and is scheduled for surgery in August 31. We will have to ensure that she recovers from this episode of bronchitis and I discussed with her that that's why I'm giving her that period of time to ensure that she recovers from the pulmonary process that is ensuing. She will call us if she has any questions and we will schedule her accordingly  Clarinda Obi A., MD 12/27/2014, 1:22 PM

## 2014-12-27 NOTE — Patient Instructions (Signed)
Plan for surgery on August 31 at 8:30am with Dr. Alycia Rossetti.  You will receive a phone call from the [facility] on [address] to go over your pre-operative instructions.  Please call for any questions or concerns.

## 2015-01-18 ENCOUNTER — Encounter (HOSPITAL_BASED_OUTPATIENT_CLINIC_OR_DEPARTMENT_OTHER): Payer: Self-pay | Admitting: *Deleted

## 2015-01-18 NOTE — Progress Notes (Signed)
NPO AFTER MN.  ARRIVE AT 0700.  NEEDS HG.  WILL TAKE WELLBUTRIN, CELEBREX, PROTONIX, SYNTHROID, AND DO INHALER W/  SIPS OF WATER.

## 2015-01-24 ENCOUNTER — Encounter (HOSPITAL_BASED_OUTPATIENT_CLINIC_OR_DEPARTMENT_OTHER)
Admission: RE | Disposition: A | Discharge status: Home / Self Care | Payer: Self-pay | Source: Ambulatory Visit | Attending: Gynecologic Oncology

## 2015-01-24 ENCOUNTER — Ambulatory Visit (HOSPITAL_BASED_OUTPATIENT_CLINIC_OR_DEPARTMENT_OTHER): Payer: Medicare Other | Admitting: Anesthesiology

## 2015-01-24 ENCOUNTER — Encounter (HOSPITAL_BASED_OUTPATIENT_CLINIC_OR_DEPARTMENT_OTHER): Payer: Self-pay | Admitting: *Deleted

## 2015-01-24 ENCOUNTER — Ambulatory Visit (HOSPITAL_BASED_OUTPATIENT_CLINIC_OR_DEPARTMENT_OTHER)
Admission: RE | Admit: 2015-01-24 | Discharge: 2015-01-24 | Disposition: A | Discharge status: Home / Self Care | Payer: Medicare Other | Source: Ambulatory Visit | Attending: Gynecologic Oncology | Admitting: Gynecologic Oncology

## 2015-01-24 DIAGNOSIS — Z853 Personal history of malignant neoplasm of breast: Secondary | ICD-10-CM | POA: Insufficient documentation

## 2015-01-24 DIAGNOSIS — M797 Fibromyalgia: Secondary | ICD-10-CM | POA: Insufficient documentation

## 2015-01-24 DIAGNOSIS — K5900 Constipation, unspecified: Secondary | ICD-10-CM | POA: Diagnosis not present

## 2015-01-24 DIAGNOSIS — C511 Malignant neoplasm of labium minus: Secondary | ICD-10-CM | POA: Diagnosis not present

## 2015-01-24 DIAGNOSIS — Z885 Allergy status to narcotic agent status: Secondary | ICD-10-CM | POA: Diagnosis not present

## 2015-01-24 DIAGNOSIS — C510 Malignant neoplasm of labium majus: Secondary | ICD-10-CM | POA: Diagnosis not present

## 2015-01-24 DIAGNOSIS — E039 Hypothyroidism, unspecified: Secondary | ICD-10-CM | POA: Diagnosis not present

## 2015-01-24 DIAGNOSIS — F329 Major depressive disorder, single episode, unspecified: Secondary | ICD-10-CM | POA: Insufficient documentation

## 2015-01-24 DIAGNOSIS — C519 Malignant neoplasm of vulva, unspecified: Secondary | ICD-10-CM

## 2015-01-24 DIAGNOSIS — J45909 Unspecified asthma, uncomplicated: Secondary | ICD-10-CM | POA: Insufficient documentation

## 2015-01-24 DIAGNOSIS — Z880 Allergy status to penicillin: Secondary | ICD-10-CM | POA: Insufficient documentation

## 2015-01-24 DIAGNOSIS — Z79899 Other long term (current) drug therapy: Secondary | ICD-10-CM | POA: Diagnosis not present

## 2015-01-24 DIAGNOSIS — K219 Gastro-esophageal reflux disease without esophagitis: Secondary | ICD-10-CM | POA: Insufficient documentation

## 2015-01-24 DIAGNOSIS — Z87891 Personal history of nicotine dependence: Secondary | ICD-10-CM | POA: Insufficient documentation

## 2015-01-24 DIAGNOSIS — F419 Anxiety disorder, unspecified: Secondary | ICD-10-CM | POA: Insufficient documentation

## 2015-01-24 DIAGNOSIS — H3530 Unspecified macular degeneration: Secondary | ICD-10-CM | POA: Diagnosis not present

## 2015-01-24 DIAGNOSIS — Z888 Allergy status to other drugs, medicaments and biological substances status: Secondary | ICD-10-CM | POA: Diagnosis not present

## 2015-01-24 DIAGNOSIS — Z86010 Personal history of colonic polyps: Secondary | ICD-10-CM | POA: Diagnosis not present

## 2015-01-24 DIAGNOSIS — M1990 Unspecified osteoarthritis, unspecified site: Secondary | ICD-10-CM | POA: Diagnosis not present

## 2015-01-24 HISTORY — PX: VULVECTOMY: SHX1086

## 2015-01-24 LAB — POCT HEMOGLOBIN-HEMACUE: Hemoglobin: 12.7 g/dL (ref 12.0–15.0)

## 2015-01-24 SURGERY — WIDE EXCISION VULVECTOMY
Anesthesia: General | Site: Vulva | Laterality: Bilateral

## 2015-01-24 MED ORDER — PROPOFOL 10 MG/ML IV BOLUS
INTRAVENOUS | Status: DC | PRN
  Administered 2015-01-24: 20 mg via INTRAVENOUS
  Administered 2015-01-24: 180 mg via INTRAVENOUS

## 2015-01-24 MED ORDER — FENTANYL CITRATE (PF) 100 MCG/2ML IJ SOLN
25.0000 ug | INTRAMUSCULAR | Status: DC | PRN
  Filled 2015-01-24: qty 1

## 2015-01-24 MED ORDER — KETOROLAC TROMETHAMINE 30 MG/ML IJ SOLN
INTRAMUSCULAR | Status: DC | PRN
  Administered 2015-01-24: 15 mg via INTRAVENOUS

## 2015-01-24 MED ORDER — MIDAZOLAM HCL 2 MG/2ML IJ SOLN
INTRAMUSCULAR | Status: AC
  Filled 2015-01-24: qty 2

## 2015-01-24 MED ORDER — OXYCODONE-ACETAMINOPHEN 5-325 MG PO TABS: 1.0000 | ORAL_TABLET | ORAL | Status: DC | PRN

## 2015-01-24 MED ORDER — FENTANYL CITRATE (PF) 100 MCG/2ML IJ SOLN
INTRAMUSCULAR | Status: AC
  Filled 2015-01-24: qty 4

## 2015-01-24 MED ORDER — LIDOCAINE HCL (CARDIAC) 20 MG/ML IV SOLN
INTRAVENOUS | Status: DC | PRN
  Administered 2015-01-24: 60 mg via INTRAVENOUS

## 2015-01-24 MED ORDER — LACTATED RINGERS IV SOLN
INTRAVENOUS | Status: DC
Start: 2015-01-24 — End: 2015-01-24
  Administered 2015-01-24 (×2): via INTRAVENOUS
  Filled 2015-01-24: qty 1000

## 2015-01-24 MED ORDER — BUPIVACAINE HCL 0.5 % IJ SOLN
INTRAMUSCULAR | Status: DC | PRN
  Administered 2015-01-24: 20 mL

## 2015-01-24 MED ORDER — ACETIC ACID 5 % SOLN
Status: DC | PRN
  Administered 2015-01-24: 1 via TOPICAL

## 2015-01-24 MED ORDER — FENTANYL CITRATE (PF) 100 MCG/2ML IJ SOLN
INTRAMUSCULAR | Status: DC | PRN
  Administered 2015-01-24 (×2): 50 ug via INTRAVENOUS

## 2015-01-24 MED ORDER — DEXAMETHASONE SODIUM PHOSPHATE 4 MG/ML IJ SOLN
INTRAMUSCULAR | Status: DC | PRN
  Administered 2015-01-24: 10 mg via INTRAVENOUS

## 2015-01-24 MED ORDER — ACETAMINOPHEN 10 MG/ML IV SOLN
INTRAVENOUS | Status: DC | PRN
  Administered 2015-01-24: 1000 mg via INTRAVENOUS

## 2015-01-24 MED ORDER — SUCCINYLCHOLINE CHLORIDE 20 MG/ML IJ SOLN
INTRAMUSCULAR | Status: DC | PRN
  Administered 2015-01-24: 100 mg via INTRAVENOUS

## 2015-01-24 SURGICAL SUPPLY — 37 items
APPLICATOR COTTON TIP 6IN STRL (MISCELLANEOUS) IMPLANT
BLADE SURG 15 STRL LF DISP TIS (BLADE) ×1 IMPLANT
BLADE SURG 15 STRL SS (BLADE) ×2
CANISTER SUCTION 1200CC (MISCELLANEOUS) IMPLANT
CANISTER SUCTION 2500CC (MISCELLANEOUS) IMPLANT
CATH ROBINSON RED A/P 16FR (CATHETERS) ×1 IMPLANT
COVER BACK TABLE 60X90IN (DRAPES) ×2 IMPLANT
DRAPE LG THREE QUARTER DISP (DRAPES) ×2 IMPLANT
DRAPE UNDERBUTTOCKS STRL (DRAPE) ×2 IMPLANT
DRSG TELFA 3X8 NADH (GAUZE/BANDAGES/DRESSINGS) IMPLANT
ELECT REM PT RETURN 9FT ADLT (ELECTROSURGICAL) ×2
ELECTRODE REM PT RTRN 9FT ADLT (ELECTROSURGICAL) ×1 IMPLANT
GLOVE BIO SURGEON STRL SZ 6.5 (GLOVE) ×4 IMPLANT
GLOVE INDICATOR 7.0 STRL GRN (GLOVE) ×2 IMPLANT
GOWN STRL REUS W/ TWL LRG LVL3 (GOWN DISPOSABLE) ×1 IMPLANT
GOWN STRL REUS W/TWL LRG LVL3 (GOWN DISPOSABLE) ×2
LEGGING LITHOTOMY PAIR STRL (DRAPES) ×2 IMPLANT
MANIFOLD NEPTUNE II (INSTRUMENTS) IMPLANT
NDL HYPO 25X1 1.5 SAFETY (NEEDLE) IMPLANT
NEEDLE HYPO 25X1 1.5 SAFETY (NEEDLE) IMPLANT
NS IRRIG 500ML POUR BTL (IV SOLUTION) IMPLANT
PACK BASIN DAY SURGERY FS (CUSTOM PROCEDURE TRAY) ×2 IMPLANT
PAD DRESSING TELFA 3X8 NADH (GAUZE/BANDAGES/DRESSINGS) IMPLANT
PAD OB MATERNITY 4.3X12.25 (PERSONAL CARE ITEMS) ×2 IMPLANT
PAD PREP 24X48 CUFFED NSTRL (MISCELLANEOUS) ×2 IMPLANT
PENCIL BUTTON HOLSTER BLD 10FT (ELECTRODE) ×1 IMPLANT
SCOPETTES 8  STERILE (MISCELLANEOUS)
SCOPETTES 8 STERILE (MISCELLANEOUS) IMPLANT
SUT VIC AB 2-0 SH 27 (SUTURE) ×2
SUT VIC AB 2-0 SH 27XBRD (SUTURE) ×1 IMPLANT
SUT VIC AB 3-0 SH 27 (SUTURE) ×4
SUT VIC AB 3-0 SH 27X BRD (SUTURE) ×2 IMPLANT
TOWEL OR 17X24 6PK STRL BLUE (TOWEL DISPOSABLE) ×4 IMPLANT
TRAY DSU PREP LF (CUSTOM PROCEDURE TRAY) ×2 IMPLANT
TUBE CONNECTING 12X1/4 (SUCTIONS) ×2 IMPLANT
WATER STERILE IRR 500ML POUR (IV SOLUTION) ×2 IMPLANT
YANKAUER SUCT BULB TIP NO VENT (SUCTIONS) ×2 IMPLANT

## 2015-01-24 NOTE — Brief Op Note (Signed)
01/24/2015  9:25 AM  PATIENT:  [name]  69 y.o. female  PRE-OPERATIVE DIAGNOSIS:  pagets diseas of vulva  POST-OPERATIVE DIAGNOSIS:  pagets diseas of vulva  PROCEDURE:  Procedure(s): WIDE LOCAL EXCISION VULVA (Bilateral)  SURGEON:  Surgeon(s) and Role:    * [name], MD - Primary  PHYSICIAN ASSISTANT:   ASSISTANTS: none   ANESTHESIA:GETA, 20 mL 0.5% marcaine, plain  EBL:  Total I/O In: 700 [I.V.:700] Out: 250 [Urine:250]  BLOOD ADMINISTERED:none  DRAINS: none   SPECIMEN:  Excision  DISPOSITION OF SPECIMEN:  PATHOLOGY  COUNTS:  YES  TOURNIQUET:  * No tourniquets in log *  DICTATION: .Dragon Dictation  PLAN OF CARE: Discharge to home after PACU  PATIENT DISPOSITION:  PACU - hemodynamically stable.   Delay start of Pharmacological VTE agent (>24hrs) due to surgical blood loss or risk of bleeding: not applicable

## 2015-01-24 NOTE — Transfer of Care (Signed)
Last Vitals:  Filed Vitals:   01/24/15 0720  BP: 98/40  Pulse: 72  Temp: 37.1 C  Resp: 16    Immediate Anesthesia Transfer of Care Note  Patient: [name]  Procedure(s) Performed: Procedure(s) (LRB): WIDE LOCAL EXCISION VULVA (Bilateral)  Patient Location: PACU  Anesthesia Type: General  Level of Consciousness: awake, alert  and oriented  Airway & Oxygen Therapy: Patient Spontanous Breathing and Patient connected to face mask oxygen  Post-op Assessment: Report given to PACU RN and Post -op Vital signs reviewed and stable  Post vital signs: Reviewed and stable  Complications: No apparent anesthesia complications

## 2015-01-24 NOTE — Anesthesia Procedure Notes (Signed)
Procedure Name: Intubation Date/Time: 01/24/2015 8:51 AM Performed by: Mechele Claude Pre-anesthesia Checklist: Patient identified, Emergency Drugs available, Suction available and Patient being monitored Patient Re-evaluated:Patient Re-evaluated prior to inductionOxygen Delivery Method: Circle System Utilized Preoxygenation: Pre-oxygenation with 100% oxygen Intubation Type: IV induction Ventilation: Mask ventilation without difficulty Laryngoscope Size: Mac and 3 Tube type: Oral Tube size: 7.0 mm Number of attempts: 1 Airway Equipment and Method: Stylet and Oral airway Placement Confirmation: ETT inserted through vocal cords under direct vision,  positive ETCO2 and breath sounds checked- equal and bilateral Secured at: 21 cm Tube secured with: Tape Dental Injury: Teeth and Oropharynx as per pre-operative assessment

## 2015-01-24 NOTE — Anesthesia Postprocedure Evaluation (Signed)
  Anesthesia Post-op Note  Patient: [name]  Procedure(s) Performed: Procedure(s): WIDE LOCAL EXCISION VULVA (Bilateral)  Patient Location: PACU  Anesthesia Type:General  Level of Consciousness: awake  Airway and Oxygen Therapy: Patient Spontanous Breathing  Post-op Pain: mild  Post-op Assessment: Post-op Vital signs reviewed              Post-op Vital Signs: Reviewed  Last Vitals:  Filed Vitals:   01/24/15 1051  BP: 103/44  Pulse: 70  Temp: 36.6 C  Resp: 16    Complications: No apparent anesthesia complications

## 2015-01-24 NOTE — Op Note (Signed)
PATIENT: [name] DATE: 01/24/2015   Preop Diagnosis: Paget's disease  Postoperative Diagnosis: Same  Surgery: WLE excision of the vulva x2  Surgeons:  Imagene Gurney A. Alycia Rossetti, MD  Anesthesia: General   Estimated blood loss: 10 ml  IVF: 500 ml   Urine output: 161 ml   Complications: None   Pathology: Wide local excisions of left and right vulva with sutures at 12  Operative findings: On the right vulva there was approximately 2 cm patch of Paget's disease in the mid labia majora. On the left side near the small remaining labia minora and the area prior excision there is a smaller lesion consistent with Paget's disease.  Procedure: The patient was identified in the preoperative holding area. Informed consent was signed on the chart. Patient was seen history was reviewed and exam was performed.   The patient was then taken to the operating room and placed in the supine position with SCD hose on. General anesthesia was then induced without difficulty. She was then placed in the dorsolithotomy position. Perineum was prepped with Betadine. The vagina was prepped with Betadine a I&O catheter was inserted into the bladder under sterile conditions.  Patient was then draped. Timeout was performed. Acetic acid was placed on the vulva to assist with evaluation of the lesions. The lesions as noted above were identified. 1/2% Marcaine plain was injected into both of the lesions. On the left side which was addressed first, elliptical incision measuring approximately 4 x 1 cm was made with a knife and carried down to the appropriate depth sharply. It was handed off to the circulator with a suture marked at 12:00. Hemostasis was obtained using pinpoint cautery. The skin was closed using a 3.0 Vicryl suture in an interrupted fashion. After hemostasis was obtained on the left side attention was drawn to the right.   Similarly, an elliptical incision was made with a visible negative margins after  infiltrating the area with half percent plain Marcaine. An elliptical incision was made with a knife and carried down to the appropriate depth. The specimen was handed off to the circulator with a suture at 12:00. Hemostasis was obtained using cautery. The skin was closed using a 3.0 Vicryl in an interrupted fashion.  All instrument, suture, Ray-Tec, and needle counts were correct x2. The patient tolerated the procedure well and was taken recovery room in stable condition. This is [name] dictating an operative note on [name].

## 2015-01-24 NOTE — Discharge Instructions (Signed)

## 2015-01-24 NOTE — H&P (View-Only) (Signed)
Consult Note: Gyn-Onc  [name] 69 y.o. female  CC:  Chief Complaint  Patient presents with  . 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 Some of the surgical margins were positive. Her postoperative course was uncomplicated. She was last seen by our service in 69/14. At that time there was no evidence of any lesions consistent with Paget's. However, there is considerable amount of excoriations throughout the entire vulvar and medial thighs consistent with chronic vulvitis.In December 2013 she had a hernia repair due to bowel obstruction by Dr. Hoxworth with permanent mesh. In May 2013 she also had a TVT bladder by Dr. Angela Roberts. She does continue to wear a pad and also takes Detrol twice daily but states that her urinary leakage is markedly improved. She described the vulvar irritation is intermittent itching with burning and that it has increased over time as has the pruritus. She does use a steroid cream twice daily and does scratch her vulva.   She underwent a wide local excision of the left vulva on January 2 of year 2014. Operative findings included 2 separate 3 x 3 and 1.2 cm lesions consistent with Paget's on the left.  Pathology revealed 2 foci of extramammary Paget's disease extending to the left inked margin in the right and anterior tip margins at multiple foci. The posterior margin was negative for malignancy.   I performed a biopsy of this it was consistent with recurrent Paget's disease. On 11/23/2012 showed a repeat wide local excision of liver x2 with the posterior fourchette biopsy. Findings revealed along the left labia minora/vagina is a Paget's disease measuring approximately 2.5 x 1 cm in the superior aspect of her prior left vulvar excision. The inferior aspect of a similar lesion. 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 On the left perineal body the area is somewhat excoriated pale. Biopsy was performed in this area.  Pathology revealed:  1. Vulva, biopsy, left inferior - EXTRAMAMMARY PAGET DISEASE EXTENDING TO THE EDGES OF THE BIOPSY. 2. Vulva, excision, left superior lesion suture marks 1200 - EXTRAMAMMARY PAGET DISEASE. - MARGINS NOT INVOLVED. - CLOSEST MARGIN 9 O'CLOCK, LESS THAN 0.1 CM. 3. Vulva, excision, left inferior excision suture marks 1200 - EXTRAMAMMARY PAGET DISEASE, 12 O'CLOCK MARGIN FOCALLY INVOLVED. - REMAINING MARGINS CLEAR.  We have discussed repeating surgery however she had multiple orthopedic issues had broken ankle. She was last seen by Melissa Cross in December 2014. Exam at that time was fairly unremarkable.  I had seen her in January of 2015. At that time exam was concerning for Paget's.   On 08/02/2013 she underwent wide local excisions of the vulva x2.  Operative findings included pale pink raised lesion measuring 1 x 0.5 cm the left upper vulva near the prior excision site. There is a pale raised lesion measuring 1.5 x 1 cm on the left crossing midline perineal body.  Pathology revealed left superior vulvar excision with extramammary Paget's disease extending to the 9:00 margin. The other margins were negative for tumor. Of the left of midline lesion she extramammary Paget's disease extending to the 12:00 margin and focally at the 6:00 margin but otherwise negative margins. The patient at that time discussed with us that she was having some right lower quadrant pain. Her exam is very difficult secondary to habitus. There ultrasound was   obtained. Ultrasound revealed:  FINDINGS:  Uterus Measurements: 4.6 x 3.5 x 3.8 cm. 2.5 x 1.7 x 1.6 cm uterine fibroid. This is present in the right lateral aspect of the body uterus.  Endometrium Thickness: 6.1 mm. No focal abnormality visualized.  Right ovary: No focal abnormality.  Left ovary :No focal abnormality.  Other  findings: No free fluid  IMPRESSION:  Uterine fibroid otherwise negative exam.  I saw her March 16, 2014 at which time there was an area on the left vulva with hyperkeratosis. A biopsy of that area was performed consistent with Paget's disease. On March 29, 2014 she underwent wide local excision of the vulva. On exam she had a 1 cm left vulvar lesion just below the left labia minora. She underwent a wide local excision. He received a 3.7 x 1.7 x 0.3 cm lesion. There was extramammary Paget's disease extending to the 6 to 9:00 margin in the 12:00 margin. This is despite grossly negative margins on physical examination.   She had an excision in November that revealed Diagnosis Vulva, excision, w/stitch @ 1200 - EXTRAMAMMARY PAGET'S DISEASE, EXTENDING TO THE 6-9 O'CLOCK MARGIN AND 12 O'CLOCK PERPENDICULAR MARGIN.  I saw her in February at which time there was concern for recurrent disease. She decided to proceed with Aldara therapy. She used for approximate 4 weeks and then decided to stop it secondary to expensive medication. She then started using the clobetasol. While she was using the Aldara all the pruritus in her vulva resolved itself. Since she stopped it and was using the steroid cream the pruritus returned.  I saw her in May at which time she has small area consistent with Paget's on the right labia minora and a larger patch on the left. Biopsy on the right revealed extramammary Paget's. After discussion she wished to try the Aldara again. She called and requested an earlier appointment as for the last 2-3 weeks had increased itching and has been scratching so much that she's had some bleeding. I last saw her on July 6. At that time she had been having a lot of symptoms of pruritus and scratching a great deal. Vulvar examination at that time revealed a fairly macerated vulva and it was possible to discern what could be related to Paget's, effect of the Aldara therapy, trauma from scratching.  She was asked to stop the Aldara therapy and use a low-dose daily cream. We asked her to refrain from scratching come in today for evaluation.  She states she was initially diagnosed with a sinus infection. Her husband was diagnosed with the flu. She was seen at the [facility] urgent care and was treated for a sinus infection and given Mucinex. She did not improve and was followed up ultimately by the PA at her physician's office who diagnosed her with bronchitis. She's on day 2 of a Z-Pak. She is no longer had fevers as her last one was a temperature of 102.4 on Thursday. She was told by her primary physician's office if she still had coughing this coming Monday to proceed with a chest x-ray. She is starting to feel a bit better but continues to have a productive cough. Current Meds:  Outpatient Encounter Prescriptions as of 12/27/2014  Medication Sig  . azithromycin (ZITHROMAX) 250 MG tablet Take two tablets on the first day and then one tablet every day after.  . beta carotene w/minerals (OCUVITE) tablet Take 1 tablet by mouth daily.  . betamethasone valerate ointment (VALISONE) 0.1 % Apply 1 application   topically 2 (two) times daily.  . buPROPion (WELLBUTRIN XL) 150 MG 24 hr tablet Take 150 mg by mouth every morning.   . Calcium Carbonate-Vitamin D 600-125 MG-UNIT TABS Take 1 tablet by mouth daily.   . chlorpheniramine-HYDROcodone (TUSSIONEX PENNKINETIC ER) 10-8 MG/5ML SUER Take 5 mLs by mouth.  . citalopram (CELEXA) 40 MG tablet Take 40 mg by mouth every morning.   . clotrimazole-betamethasone (LOTRISONE) cream Apply topically 2 (two) times daily.  . docusate sodium (COLACE) 100 MG capsule Take 100 mg by mouth daily.   . levothyroxine (SYNTHROID, LEVOTHROID) 175 MCG tablet TAKE 1 TABLET (175 MCG TOTAL) BY MOUTH DAILY.  . Multiple Vitamins-Minerals (MULTIVITAMIN GUMMIES ADULT) CHEW Chew 2 tablets by mouth daily.  . Omega-3 1000 MG CAPS Take 1 g by mouth daily.   . pantoprazole (PROTONIX) 40 MG tablet  Take 40 mg by mouth every morning.   . polyvinyl alcohol (LIQUIFILM TEARS) 1.4 % ophthalmic solution Place 1 drop into both eyes as needed for dry eyes.  . promethazine-dextromethorphan (PROMETHAZINE-DM) 6.25-15 MG/5ML syrup TAKE 5 ML BY MOUTH 4 (FOUR) TIMES A DAY AS NEEDED FOR COUGH FOR UP TO 7 DAYS.  . spironolactone (ALDACTONE) 25 MG tablet Take 0.5-1 tablets (12.5-25 mg total) by mouth as needed (for swelling).  . imiquimod (ALDARA) 5 % cream Apply topically 3 (three) times a week. (Patient not taking: Reported on 12/07/2014)  . [DISCONTINUED] betamethasone valerate (VALISONE) 0.1 % cream Apply 0.1 application topically daily.  . [DISCONTINUED] levothyroxine (SYNTHROID, LEVOTHROID) 150 MCG tablet Take 150 mcg by mouth daily.   No facility-administered encounter medications on file as of 12/27/2014.    Allergy:  Allergies  Allergen Reactions  . Codeine Other (See Comments)    Hyper/ Hallucinations   . Demerol [Meperidine] Other (See Comments)    "Knocks me out"  . Diazepam Other (See Comments)    Extreme sedation; patient does not recall taking diazepam (12/07/14)  . Tape Hives and Other (See Comments)    "thick clear plastic tape" causes blisters  . Morphine And Related Itching and Rash    Per CRNA, pt tol Fentanyl IV for OR case, no issues reported to rec RN in PACU, no immediate PACU issues noted as well upon arrival  . Penicillins Hives and Swelling    Social Hx:   History   Social History  . Marital Status: Married    Spouse Name: N/A  . Number of Children: N/A  . Years of Education: N/A   Occupational History  . Not on file.   Social History Main Topics  . Smoking status: Former Smoker -- 2.00 packs/day for 41 years    Types: Cigarettes    Quit date: 06/05/1999  . Smokeless tobacco: Never Used  . Alcohol Use: No  . Drug Use: No  . Sexual Activity: Not on file   Other Topics Concern  . Not on file   Social History Narrative    Past Surgical Hx:  Past  Surgical History  Procedure Laterality Date  . Foot surgery Left 02-01-2009    TRIPLE ARTHRODESIS  . Rotator cuff repair Right   . Incisional hernia repair  05/05/2011    Procedure: LAPAROSCOPIC INCISIONAL HERNIA;  Surgeon: Benjamin T Hoxworth, MD;  Location: [facility];  Service: General;  Laterality: N/A;  LAPAROSCOPIC REPAIR INCARCERATED INCISIONAL HERNIA  WITH MESH  . Bladder suspension  10/13/2011    Procedure: TRANSVAGINAL TAPE (TVT) PROCEDURE;  Surgeon: Angela Y Roberts, MD;  Location: [facility];  Service:   Gynecology;  Laterality: N/A;  . Cystoscopy  10/13/2011    Procedure: CYSTOSCOPY;  Surgeon: Angela Y Roberts, MD;  Location: [facility];  Service: Gynecology;  Laterality: Bilateral;  . Vulvectomy  05/27/2012    Procedure: WIDE EXCISION VULVECTOMY;  Surgeon: Conrado Nance A. Abbrielle Batts, MD;  Location: [facility];  Service: Gynecology;  Laterality: N/A;  Wide Local Excision of Vulva   . Vulvectomy N/A 11/23/2012    Procedure: WIDE LOCAL EXCISION OF THE VULVA;  Surgeon: Marcel Sorter A. Zarah Carbon, MD;  Location: [facility];  Service: Gynecology;  Laterality: N/A;  left inferior vulvar biopsy excision left superior lesion left inferior vulvar excision  . Foot surgery Left 11/14    I&D left foot with woundvac placement  . Vulvectomy N/A 08/02/2013    Procedure: WIDE LOCAL  EXCISION VULVA;  Surgeon: Galaxy Borden A. Angee Gupton, MD;  Location: [facility];  Service: Gynecology;  Laterality: N/A;  . Tonsillectomy  1968  . Lumbar spine surgery  1998    L5 -- S1  . Dilation and curettage of uterus  1968  . Breast lumpectomy Left 03-27-2004  . Mastectomy Right 1997    W/  LYMPH NODE DISSECTION AND RECONSTRUCTION WITH IMPLANTS AND EXPANDER  . Thyroidectomy, partial  1983  . Tendon repair right ring finger  2011  . Laparoscopic cholecystectomy  06-21-2010  . Knee arthroscopy Bilateral left 01-1984 & 04-1992/   right 09-1991  . Vulvectomy partial  07-16-2010  . Cardiac catheterization  10-30-2000  dr tilley    normal coronary arteries and LVF/  ef  65-70%  . Removal right breast implant and capsulectomy  06-03-1999  . Thumb trigger release Right 1993  . Total knee arthroplasty Bilateral right 05-04-2000/  left 03-29-2001  . Closed manipulation  w/ open reduction exchange tibial femoral bearing post total left knee  04-14-2001  . Revision total knee arthroplasty Left 07-07-2005  &  12-10-2001    12-10-2001  REMOVAL TOTAL KNEE/ I&D / INSERTION ANTIBIOTIC SPACER  . Revision total knee arthroplasty Right 07-07-2005  . Port-a-cath placement and removal  1997  . Vulvectomy Left 03/29/2014    Procedure: WIDE LOCAL EXCISION OF LEFT VULVA ;  Surgeon: Caysen Whang A. Kenden Brandt, MD;  Location: [facility];  Service: Gynecology;  Laterality: Left;    Past Medical Hx:  Past Medical History  Diagnosis Date  . Macular degeneration of right eye   . Seasonal allergies   . GERD (gastroesophageal reflux disease)   . History of breast cancer no recurrence    1997  right breast cancer  s/p  mastectomy w/ snl dissection and chemoradiation/  2005  left breast cancer DCIS  s/p  lumpectomy and radiation  . Depression   . Hypothyroidism   . Asthma   . Fibromyalgia   . Arthritis   . Wears glasses   . Anxiety   . Numbness of left foot     4 toes  . History of colon polyps     2007 hyperplagia  . History of esophageal dilatation     2008  . History of small bowel obstruction     2012  . History of peptic ulcer   . Paget's disease of vulva   . PONV (postoperative nausea and vomiting)     and HARD TO WAKE  . Chronic constipation   . SUI (stress urinary incontinence, female)   . At risk for sleep apnea     STOP-BANG= 4     SENT TO PCP 03-27-2014      Family Hx:  Family History  Problem Relation Age of Onset  . Cancer Father 77    lung and kidney   . Alzheimer's disease Mother   . Alcohol abuse Brother   . Heart attack Maternal Grandmother     Vitals:  Blood pressure 131/73, pulse 89, temperature 98.2 F (36.8 C), temperature source  Oral, resp. rate 20, height 5' 5" (1.651 m), weight 226 lb 9.6 oz (102.785 kg), SpO2 96 %.  Physical Exam: Well-nourished well-developed female in no acute distress.  Pelvic: External genitalia status post excision of the left labia. The vulva appears much more normal today. 3 clear to identify an area Paget's disease in the mid right mons measuring approximately 1 x 3 cm. It is well demarcated and patchy. On the left side near the remaining labia minora there is an area of Paget's disease. The excoriation and traumas markedly improved.  Assessment/Plan: 68 -year-old with recurrent vulvar Paget's disease. She has recently been compliant with the Aldara and has significant pruritus. She has stopped that topical therapy and started using a low-dose daily cream with significant improvement in her symptoms. The traumas improve significantly so that we can actually discern what is consistent with Paget's disease versus treatment versus excoriation from scratching. She would like to proceed with wide local excision and is scheduled for surgery in August 31. We will have to ensure that she recovers from this episode of bronchitis and I discussed with her that that's why I'm giving her that period of time to ensure that she recovers from the pulmonary process that is ensuing. She will call us if she has any questions and we will schedule her accordingly  Byran Bilotti A., MD 12/27/2014, 1:22 PM   

## 2015-01-24 NOTE — Anesthesia Preprocedure Evaluation (Addendum)
Anesthesia Evaluation  Patient identified by MRN, date of birth, ID band Patient awake    Reviewed: NPO status   History of Anesthesia Complications (+) PONV  Airway Mallampati: II  TM Distance: >3 FB Neck ROM: Full    Dental   Pulmonary former smoker,  breath sounds clear to auscultation        Cardiovascular negative cardio ROS  Rhythm:Regular Rate:Normal     Neuro/Psych    GI/Hepatic GERD-  ,  Endo/Other  Hypothyroidism   Renal/GU      Musculoskeletal   Abdominal   Peds  Hematology   Anesthesia Other Findings   Reproductive/Obstetrics                            Anesthesia Physical Anesthesia Plan  ASA: II  Anesthesia Plan: General   Post-op Pain Management:    Induction: Intravenous  Airway Management Planned: LMA  Additional Equipment:   Intra-op Plan:   Post-operative Plan:   Informed Consent: I have reviewed the patients History and Physical, chart, labs and discussed the procedure including the risks, benefits and alternatives for the proposed anesthesia with the patient or authorized representative who has indicated his/her understanding and acceptance.   Dental advisory given  Plan Discussed with: CRNA and Anesthesiologist  Anesthesia Plan Comments:         Anesthesia Quick Evaluation

## 2015-01-24 NOTE — Interval H&P Note (Signed)
History and Physical Interval Note:  01/24/2015 8:40 AM  [name]  has presented today for surgery, with the diagnosis of pahgets diseas of vulva  The various methods of treatment have been discussed with the patient and family. After consideration of risks, benefits and other options for treatment, the patient has consented to  Procedure(s): WIDE LOCAL EXCISION VULVA (N/A) as a surgical intervention .  The patient's history has been reviewed, patient examined, no change in status, stable for surgery.  I have reviewed the patient's chart and labs.  Questions were answered to the patient's satisfaction.     Soham A.

## 2015-01-25 ENCOUNTER — Encounter (HOSPITAL_BASED_OUTPATIENT_CLINIC_OR_DEPARTMENT_OTHER): Payer: Self-pay | Admitting: Gynecologic Oncology

## 2015-01-26 ENCOUNTER — Telehealth: Payer: Self-pay | Admitting: *Deleted

## 2015-01-26 NOTE — Telephone Encounter (Signed)
Per [name], NP, patient notified of surgical pathology. Per pt, she is doing well at home today - she did have bleeding yesterday but it is just spotting this morning. While on the phone, gave patient f/u appt with Dr Alycia Rossetti on 02/14/15 at 12:15pm. Patient agreeable to appt and instructed to call sooner with any additional questions or concerns.

## 2015-02-14 ENCOUNTER — Encounter: Payer: Self-pay | Admitting: Gynecologic Oncology

## 2015-02-14 ENCOUNTER — Ambulatory Visit: Payer: Medicare Other | Attending: Gynecologic Oncology | Admitting: Gynecologic Oncology

## 2015-02-14 VITALS — BP 135/77 | HR 78 | Temp 98.6°F | Resp 19 | Ht 65.0 in | Wt 229.6 lb

## 2015-02-14 DIAGNOSIS — C4499 Other specified malignant neoplasm of skin, unspecified: Secondary | ICD-10-CM

## 2015-02-14 NOTE — Progress Notes (Signed)
Consult Note: Gyn-Onc  [name] 69 y.o. female  CC:  Chief Complaint  Patient presents with  . 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 Some of the surgical margins were positive. Her postoperative course was uncomplicated. She was last seen by our service in 12/14. At that time there was no evidence of any lesions consistent with Paget's. However, there is considerable amount of excoriations throughout the entire vulvar and medial thighs consistent with chronic vulvitis.In December 2013 she had a hernia repair due to bowel obstruction by Dr. Excell Seltzer with permanent mesh. In May 2013 she also had a TVT bladder by Dr. Everett Graff. She does continue to wear a pad and also takes Detrol twice daily but states that her urinary leakage is markedly improved. She described the vulvar irritation is intermittent itching with burning and that it has increased over time as has the pruritus. She does use a steroid cream twice daily and does scratch her vulva.   She underwent a wide local excision of the left vulva on January 2 of year 2014. Operative findings included 2 separate 3 x 3 and 1.2 cm lesions consistent with Paget's on the left.  Pathology revealed 2 foci of extramammary Paget's disease extending to the left inked margin in the right and anterior tip margins at multiple foci. The posterior margin was negative for malignancy.   I performed a biopsy of this it was consistent with recurrent Paget's disease. On 11/23/2012 showed a repeat wide local excision of liver x2 with the posterior fourchette biopsy. Findings revealed along the left labia minora/vagina is a Paget's disease measuring approximately 2.5 x 1 cm in the superior aspect of her prior left vulvar excision. The inferior aspect of a similar lesion. 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 On the left  perineal body the area is somewhat excoriated pale. Biopsy was performed in this area.  Pathology revealed:  1. Vulva, biopsy, left inferior - EXTRAMAMMARY PAGET DISEASE EXTENDING TO THE EDGES OF THE BIOPSY. 2. Vulva, excision, left superior lesion suture marks 1200 - EXTRAMAMMARY PAGET DISEASE. - MARGINS NOT INVOLVED. - CLOSEST MARGIN 9 O'CLOCK, LESS THAN 0.1 CM. 3. Vulva, excision, left inferior excision suture marks 1200 - EXTRAMAMMARY PAGET DISEASE, 12 O'CLOCK MARGIN FOCALLY INVOLVED. - REMAINING MARGINS CLEAR.  We have discussed repeating surgery however she had multiple orthopedic issues had broken ankle. She was last seen by [name] in December 2014. Exam at that time was fairly unremarkable.  I had seen her in January of 2015. At that time exam was concerning for Paget's.   On 08/02/2013 she underwent wide local excisions of the vulva x2.  Operative findings included pale pink raised lesion measuring 1 x 0.5 cm the left upper vulva near the prior excision site. There is a pale raised lesion measuring 1.5 x 1 cm on the left crossing midline perineal body.  Pathology revealed left superior vulvar excision with extramammary Paget's disease extending to the 9:00 margin. The other margins were negative for tumor. Of the left of midline lesion she extramammary Paget's disease extending to the 12:00 margin and focally at the 6:00 margin but otherwise negative margins. The patient at that time discussed with [place] that she was having some right lower quadrant pain. Her exam is very difficult secondary to habitus. There ultrasound was obtained. Ultrasound revealed:  FINDINGS:  Uterus Measurements: 4.6 x 3.5 x 3.8 cm. 2.5 x 1.7 x 1.6 cm uterine fibroid. This is present in the right lateral aspect of the body uterus.  Endometrium Thickness: 6.1 mm. No focal abnormality visualized.  Right ovary: No focal abnormality.  Left ovary :No focal abnormality.  Other findings: No free fluid   IMPRESSION:  Uterine fibroid otherwise negative exam.  I saw her March 16, 2014 at which time there was an area on the left vulva with hyperkeratosis. A biopsy of that area was performed consistent with Paget's disease. On March 29, 2014 she underwent wide local excision of the vulva. On exam she had a 1 cm left vulvar lesion just below the left labia minora. She underwent a wide local excision. He received a 3.7 x 1.7 x 0.3 cm lesion. There was extramammary Paget's disease extending to the 6 to 9:00 margin in the 12:00 margin. This is despite grossly negative margins on physical examination.   She had an excision in November that revealed Diagnosis Vulva, excision, w/stitch @ 1200 - EXTRAMAMMARY PAGET'S DISEASE, EXTENDING TO THE 6-9 O'CLOCK MARGIN AND 12 O'CLOCK PERPENDICULAR MARGIN.  I saw her in February at which time there was concern for recurrent disease. She decided to proceed with Aldara therapy. She used for approximate 4 weeks and then decided to stop it secondary to expensive medication. She then started using the clobetasol. While she was using the Aldara all the pruritus in her vulva resolved itself. Since she stopped it and was using the steroid cream the pruritus returned.  I saw her in May at which time she has small area consistent with Paget's on the right labia minora and a larger patch on the left. Biopsy on the right revealed extramammary Paget's. After discussion she wished to try the Aldara again. She called and requested an earlier appointment as for the last 2-3 weeks had increased itching and has been scratching so much that she's had some bleeding. I last saw her on July 6. At that time she had been having a lot of symptoms of pruritus and scratching a great deal. Vulvar examination at that time revealed a fairly macerated vulva and it was possible to discern what could be related to Paget's, effect of the Aldara therapy, trauma from scratching. She was asked to stop the  Aldara therapy and use a low-dose daily cream. We asked her to refrain from scratching come in today for evaluation.  I saw her again August 3 at which time there was evidence clearly a Paget's disease and she scheduled for surgery. On August 31 she underwent bilateral wide local excisions of the vulva. On the right vulva there was a 2 cm patch of Paget's disease in the mid labia majora. On the left side. The remaining labia minora in the area prior to the excision are smaller lesion consistent with Paget's disease.  Diagnosis 1. Vulva, excision, left wide excision - EXTRAMAMMARY PAGET'S DISEASE EXTENDING TO THE 12 O'CLOCK, 3 O'CLOCK, 6 O'CLOCK AND 9 O'CLOCK MARGINS. 2. Vulva, excision, right wide excision - EXTRAMAMMARY PAGET'S EXTENDING TO THE 3 O'CLOCK MARGIN AND THE 6 O'CLOCK POLE.  She comes in today for her postoperative check. She's overall doing quite well and really denies any significant complaints. She had a little bit more bleeding after the procedure this time. Discuss with her that we had pre-and post injected with numbing medication and she did not release sounds a significant difference in her postoperative discomfort. She has no complaints. Current  Meds:  Outpatient Encounter Prescriptions as of 02/14/2015  Medication Sig  . albuterol (PROVENTIL HFA;VENTOLIN HFA) 108 (90 BASE) MCG/ACT inhaler Inhale 2 puffs into the lungs every 4 (four) hours as needed for wheezing or shortness of breath.  Marland Kitchen albuterol (PROVENTIL) (2.5 MG/3ML) 0.083% nebulizer solution Take 2.5 mg by nebulization every 6 (six) hours as needed for wheezing or shortness of breath.  . beta carotene w/minerals (OCUVITE) tablet Take 1 tablet by mouth daily.  . betamethasone valerate ointment (VALISONE) 0.1 % Apply 1 application topically 2 (two) times daily.  Marland Kitchen buPROPion (WELLBUTRIN XL) 150 MG 24 hr tablet Take 150 mg by mouth every morning.   . Calcium Carbonate-Vitamin D 600-125 MG-UNIT TABS Take 1 tablet by mouth  daily.   . Cholecalciferol (VITAMIN D3) 1000 UNITS CAPS Take 2 capsules by mouth daily.  . citalopram (CELEXA) 40 MG tablet Take 40 mg by mouth every morning.   . docusate sodium (COLACE) 100 MG capsule Take 300 mg by mouth daily.   Marland Kitchen levothyroxine (SYNTHROID, LEVOTHROID) 175 MCG tablet TAKE 1 TABLET (175 MCG TOTAL) BY MOUTH DAILY.--  TAKING NAME BRAND SYNTHROID--  TAKES IN AM  . Multiple Vitamins-Minerals (MULTIVITAMIN GUMMIES ADULT) CHEW Chew 2 tablets by mouth daily.  . Omega-3 1000 MG CAPS Take 1 g by mouth daily.   . pantoprazole (PROTONIX) 40 MG tablet Take 40 mg by mouth every morning.   . polyvinyl alcohol (LIQUIFILM TEARS) 1.4 % ophthalmic solution Place 1 drop into both eyes as needed for dry eyes.  Marland Kitchen spironolactone (ALDACTONE) 25 MG tablet Take 0.5-1 tablets (12.5-25 mg total) by mouth as needed (for swelling).  . [DISCONTINUED] oxyCODONE-acetaminophen (PERCOCET/ROXICET) 5-325 MG per tablet Take 1 tablet by mouth every 4 (four) hours as needed for severe pain. (Patient not taking: Reported on 02/14/2015)   No facility-administered encounter medications on file as of 02/14/2015.    Allergy:  Allergies  Allergen Reactions  . Codeine Other (See Comments)    Hyper/ Hallucinations   . Demerol [Meperidine] Other (See Comments)    "Knocks me out"  . Diazepam Other (See Comments)    Extreme sedation; patient does not recall taking diazepam (12/07/14)  . Tape Hives and Other (See Comments)    "thick clear plastic tape" causes blisters  . Morphine And Related Itching and Rash    Per CRNA, pt tol Fentanyl IV for OR case, no issues reported to rec RN in PACU, no immediate PACU issues noted as well upon arrival  . Penicillins Hives and Swelling    Social Hx:   Social History   Social History  . Marital Status: Married    Spouse Name: N/A  . Number of Children: N/A  . Years of Education: N/A   Occupational History  . Not on file.   Social History Main Topics  . Smoking status:  Former Smoker -- 2.00 packs/day for 41 years    Types: Cigarettes    Quit date: 06/05/1999  . Smokeless tobacco: Never Used  . Alcohol Use: No  . Drug Use: No  . Sexual Activity: Not on file   Other Topics Concern  . Not on file   Social History Narrative    Past Surgical Hx:  Past Surgical History  Procedure Laterality Date  . Foot surgery Left 02-01-2009    TRIPLE ARTHRODESIS  . Rotator cuff repair Right   . Incisional hernia repair  05/05/2011    Procedure: LAPAROSCOPIC INCISIONAL HERNIA;  Surgeon: Edward Jolly, MD;  Location:  [facility];  Service: General;  Laterality: N/A;  LAPAROSCOPIC REPAIR INCARCERATED INCISIONAL HERNIA  WITH MESH  . Bladder suspension  10/13/2011    Procedure: TRANSVAGINAL TAPE (TVT) PROCEDURE;  Surgeon: Delice Lesch, MD;  Location: [facility];  Service: Gynecology;  Laterality: N/A;  . Cystoscopy  10/13/2011    Procedure: CYSTOSCOPY;  Surgeon: Delice Lesch, MD;  Location: [facility];  Service: Gynecology;  Laterality: Bilateral;  . Vulvectomy  05/27/2012    Procedure: WIDE EXCISION VULVECTOMY;  Surgeon: Imagene Gurney A. Alycia Rossetti, MD;  Location: [facility];  Service: Gynecology;  Laterality: N/A;  Wide Local Excision of Vulva   . Vulvectomy N/A 11/23/2012    Procedure: WIDE LOCAL EXCISION OF THE VULVA;  Surgeon: Imagene Gurney A. Alycia Rossetti, MD;  Location: [facility];  Service: Gynecology;  Laterality: N/A;  left inferior vulvar biopsy excision left superior lesion left inferior vulvar excision  . Foot surgery Left 11/14    I&D left foot with woundvac placement  . Vulvectomy N/A 08/02/2013    Procedure: WIDE LOCAL  EXCISION VULVA;  Surgeon: Imagene Gurney A. Alycia Rossetti, MD;  Location: [facility];  Service: Gynecology;  Laterality: N/A;  . Tonsillectomy  1968  . Lumbar spine surgery  1998    L5 -- S1  . Dilation and curettage of uterus  1968  . Breast lumpectomy Left 03-27-2004  . Mastectomy Right 1997    W/  LYMPH NODE DISSECTION AND RECONSTRUCTION WITH IMPLANTS AND EXPANDER  . Thyroidectomy,  partial  1983  . Tendon repair right ring finger  2011  . Laparoscopic cholecystectomy  06-21-2010  . Knee arthroscopy Bilateral left 01-1984 & 04-1992/   right [phone]  . Vulvectomy partial  07-16-2010  . Cardiac catheterization  10-30-2000  dr Wynonia Lawman    normal coronary arteries and LVF/  ef 65-70%  . Removal right breast implant and capsulectomy  06-03-1999  . Thumb trigger release Right 1993  . Total knee arthroplasty Bilateral right 05-04-2000/  left 03-29-2001  . Closed manipulation  w/ open reduction exchange tibial femoral bearing post total left knee  04-14-2001  . Revision total knee arthroplasty Left 07-07-2005  &  12-10-2001    12-10-2001  REMOVAL TOTAL KNEE/ I&D / INSERTION ANTIBIOTIC SPACER  . Revision total knee arthroplasty Right 07-07-2005  . Port-a-cath placement and removal  1997  . Vulvectomy Left 03/29/2014    Procedure: WIDE LOCAL EXCISION OF LEFT VULVA ;  Surgeon: Imagene Gurney A. Alycia Rossetti, MD;  Location: [facility];  Service: Gynecology;  Laterality: Left;  . Vulvectomy Bilateral 01/24/2015    Procedure: WIDE LOCAL EXCISION VULVA;  Surgeon: [name], MD;  Location: [place];  Service: Gynecology;  Laterality: Bilateral;    Past Medical Hx:  Past Medical History  Diagnosis Date  . Macular degeneration of right eye   . Seasonal allergies   . GERD (gastroesophageal reflux disease)   . History of breast cancer no recurrence    1997  right breast cancer  s/p  mastectomy w/ snl dissection and chemoradiation/  2005  left breast cancer DCIS  s/p  lumpectomy and radiation  . Depression   . Hypothyroidism   . Asthma   . Fibromyalgia   . Arthritis   . Wears glasses   . Anxiety   . Numbness of left foot     4 toes  . History of colon polyps     2007 hyperplagia  . History of esophageal dilatation     2008  . History of  small bowel obstruction     2012  . History of peptic ulcer   . Paget's disease of vulva   . Chronic constipation    . SUI (stress urinary incontinence, female)   . At risk for sleep apnea     STOP-BANG= 4     SENT TO PCP 03-27-2014  . History of acute bronchitis     BEGINNING OF AUG 2016--  NO SYMPTOM FREE  . PONV (postoperative nausea and vomiting)     and HARD TO WAKE    Family Hx:  Family History  Problem Relation Age of Onset  . Cancer Father 18    lung and kidney   . Alzheimer's disease Mother   . Alcohol abuse Brother   . Heart attack Maternal Grandmother     Vitals:  Blood pressure 135/77, pulse 78, temperature 98.6 F (37 C), temperature source Oral, resp. rate 19, height 5\' 5"  (1.651 m), weight 229 lb 9.6 oz (104.146 kg), SpO2 99 %.  Physical Exam: Well-nourished well-developed female in no acute distress.  Pelvic: Well-healed surgical incisions. On the right side near the inferior margin of the excision there is a small separation measuring approximately 1 cm. It iis only about 2-3 mm in width. The area is clean there is no exudate.  Assessment/Plan: 33 -year-old with recurrent vulvar Paget's disease. She will continue applying lidocaine to the area for symptomatic relief so she does not scratch the area. She'll return to see me in 3 months.  [name] A., MD 02/14/2015, 12:41 PM

## 2015-02-14 NOTE — Patient Instructions (Signed)
Return to clinic in 3 months

## 2015-03-08 IMAGING — CR DG HIP COMPLETE 2+V*R*
3 series · 3 of 3 positions shown · non-contrast
Comparison: None.

CLINICAL DATA: Fall, right hip pain.

RIGHT HIP - COMPLETE 2+ VIEW

[t hip ap right (1 of 2)]
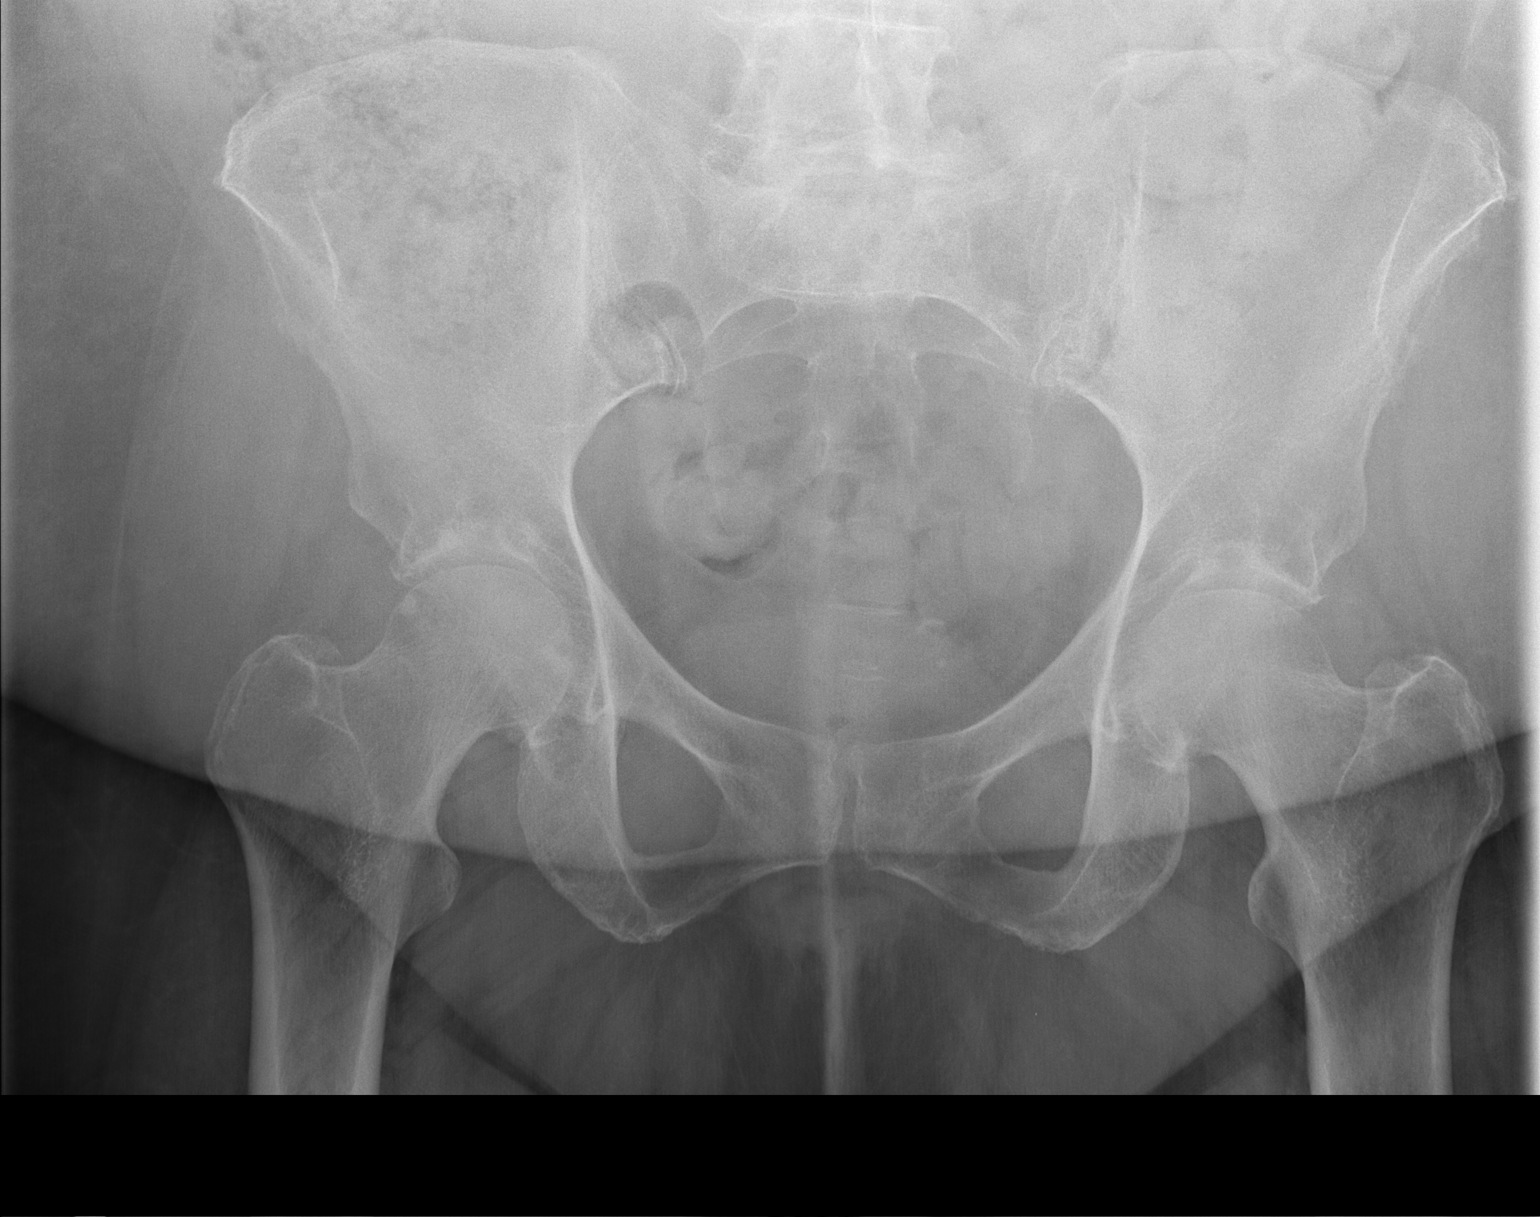

[t hip ap right (2 of 2)]
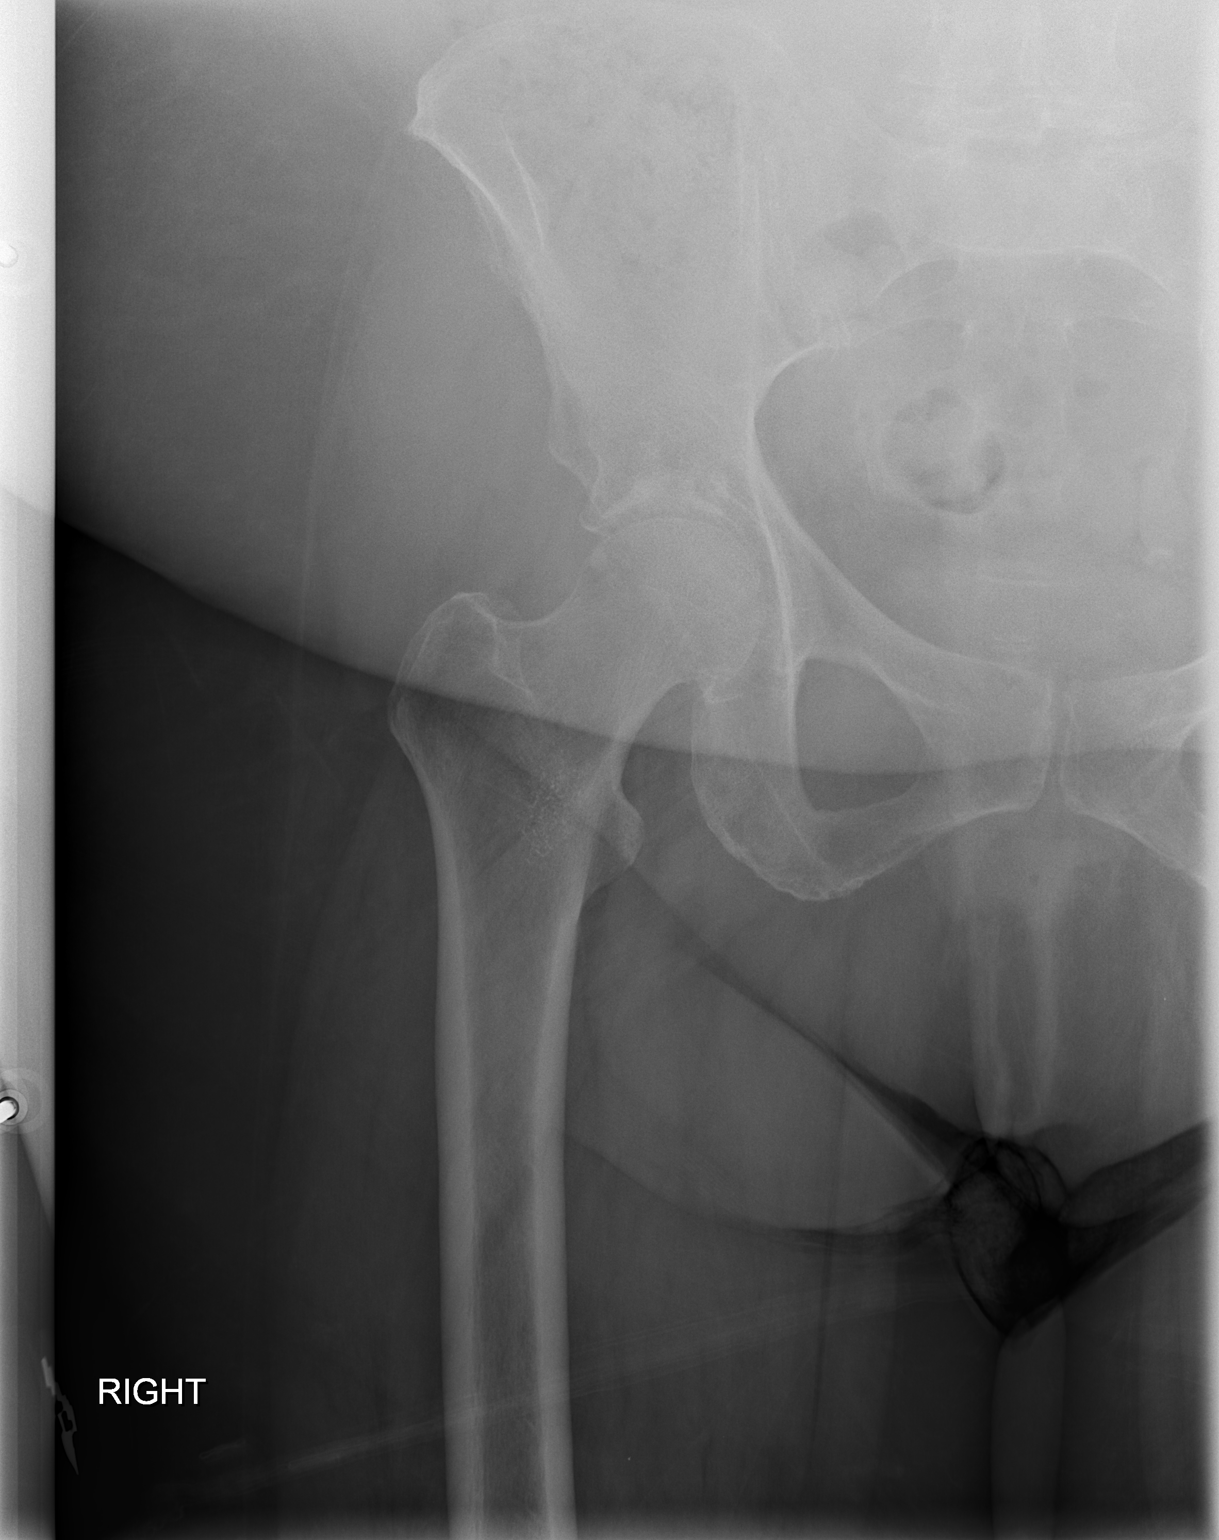

[t hip frog leg right]
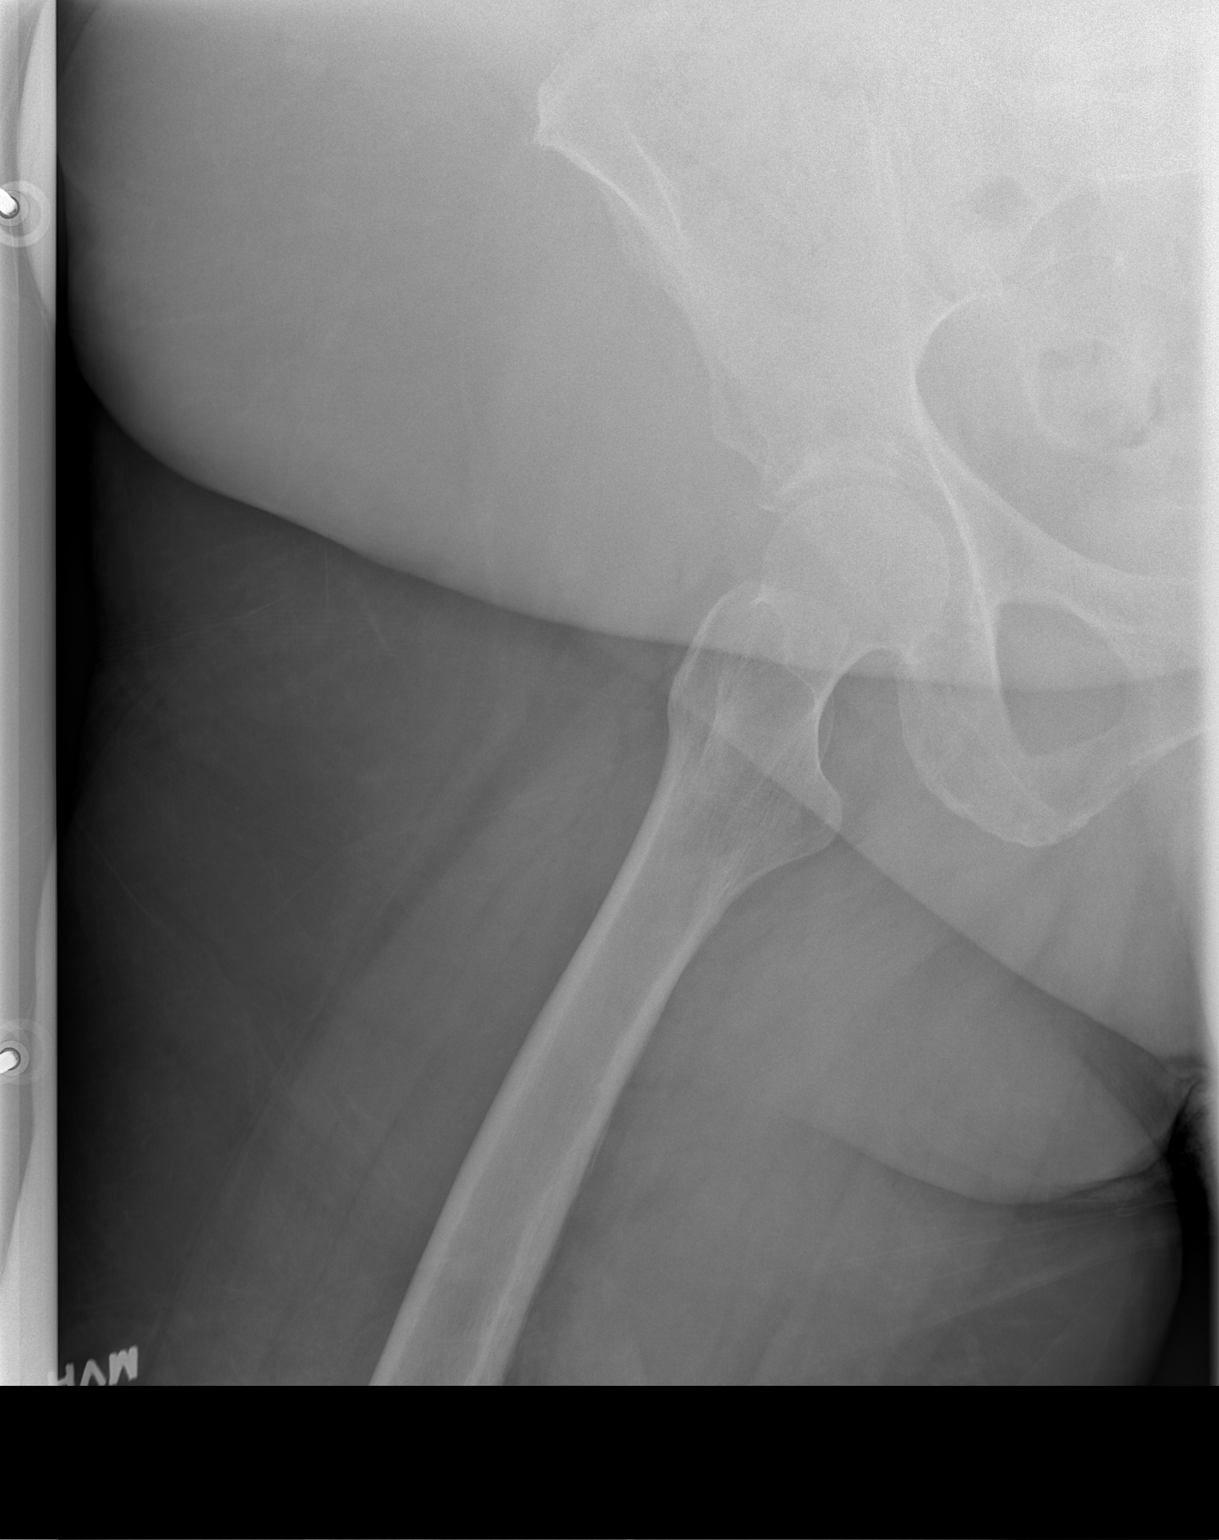

[3 of 3 positions shown; findings below may reference images not displayed]

FINDINGS: There is no evidence of hip fracture or dislocation.
There is no evidence of arthropathy or other focal bone
abnormality. Mild degenerative joint space narrowing is present on
the right.
IMPRESSION: Negative for fracture.

## 2015-03-08 IMAGING — CR DG KNEE COMPLETE 4+V*R*
4 series · 4 of 4 positions shown · non-contrast
Comparison: 03/13/2012

CLINICAL DATA: Knee pain secondary to a fall.  Previous knee
prosthesis.

RIGHT KNEE - COMPLETE 4+ VIEW

[t knee ap right]
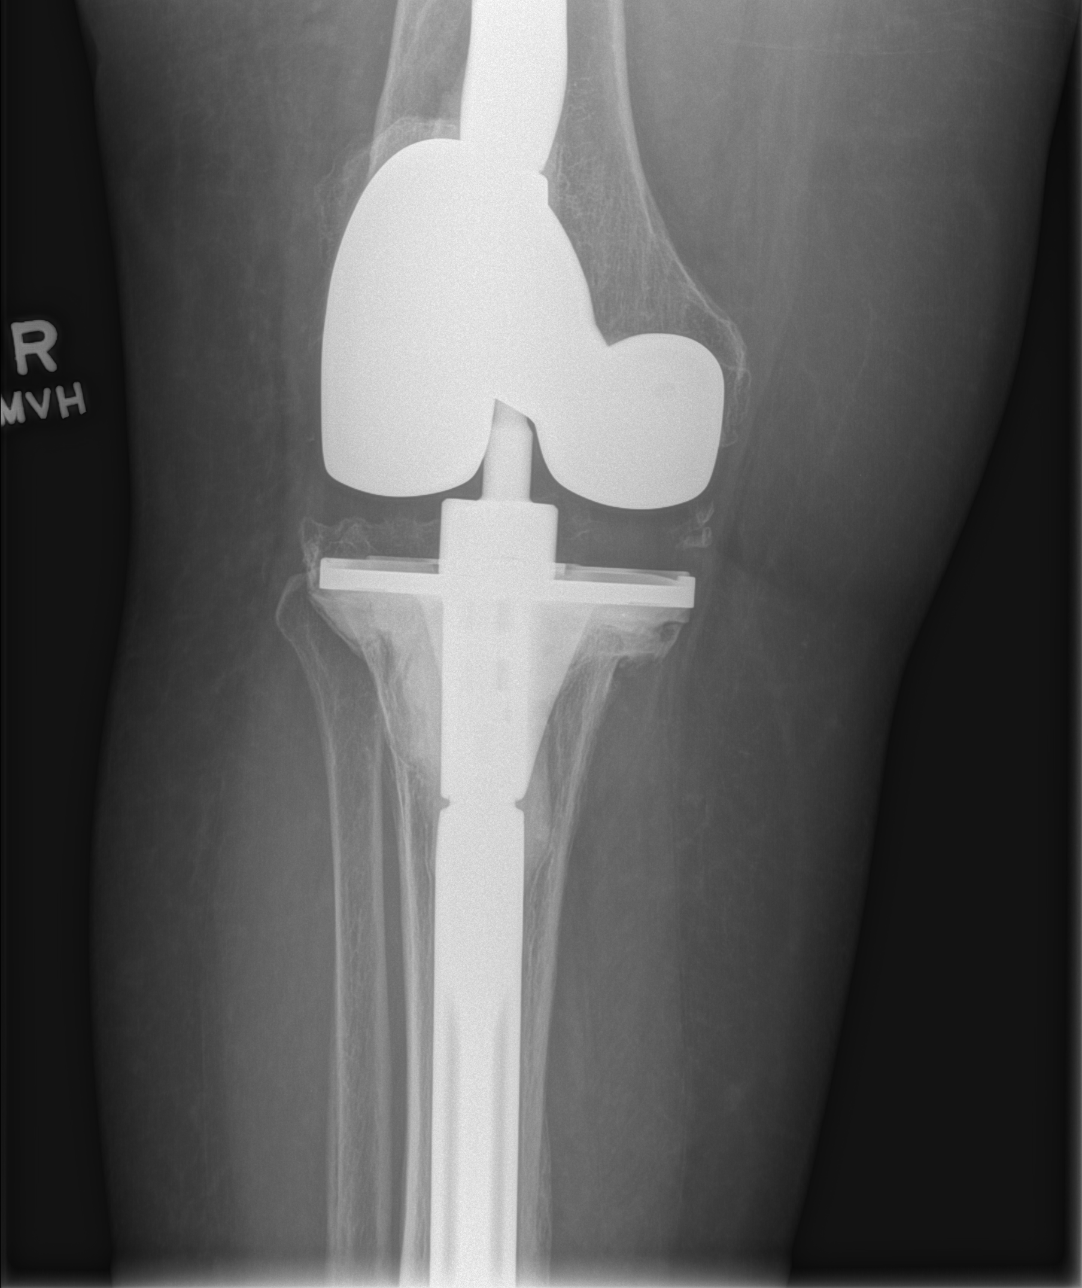

[t knee obl right (1 of 2)]
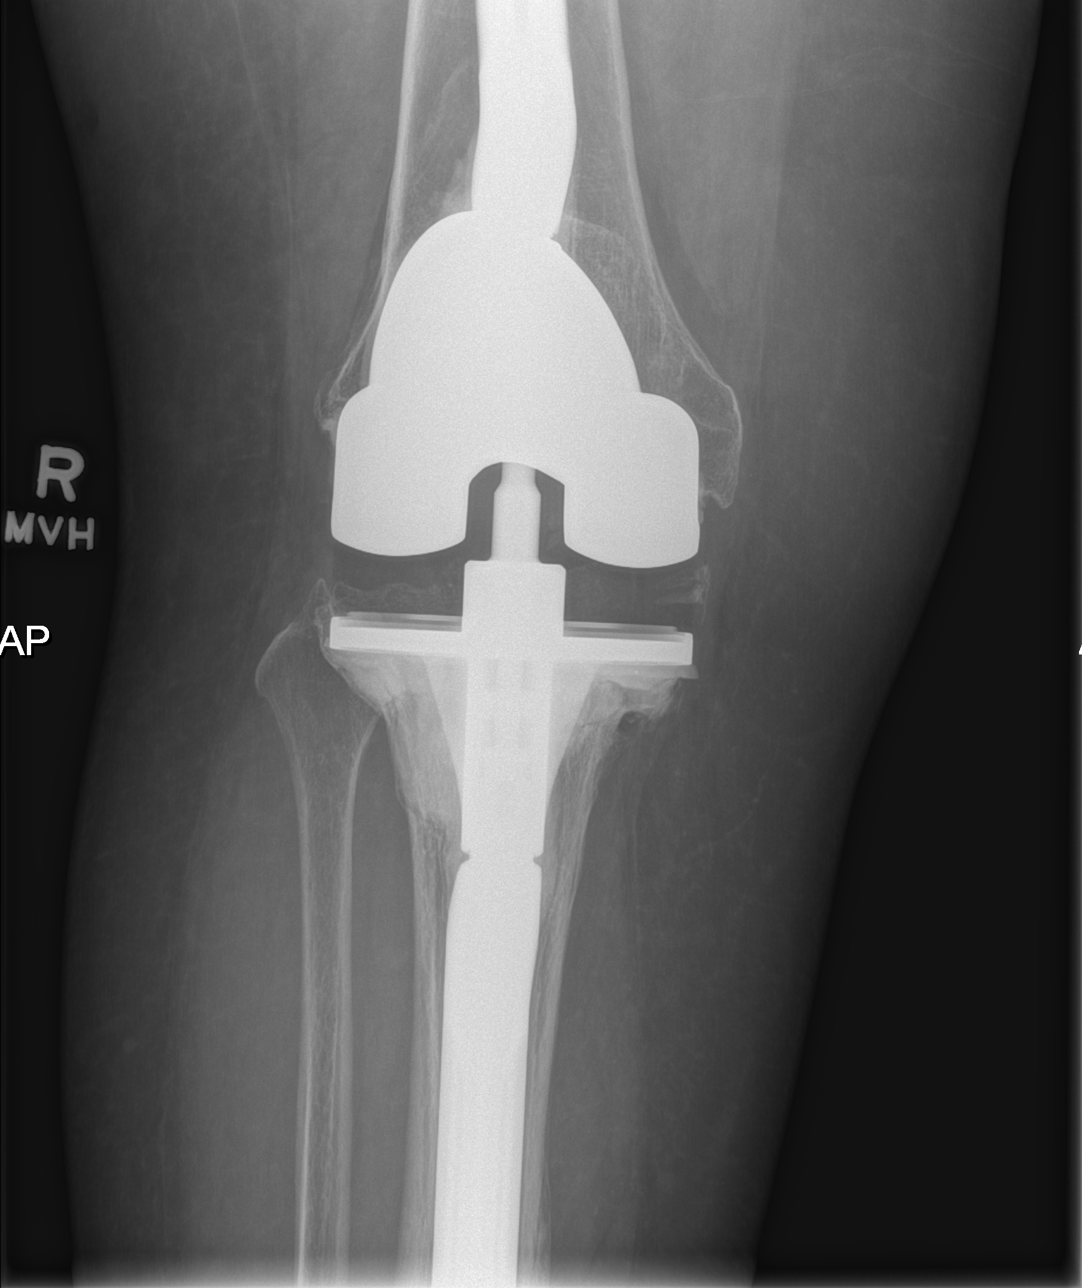

[t knee obl right (2 of 2)]
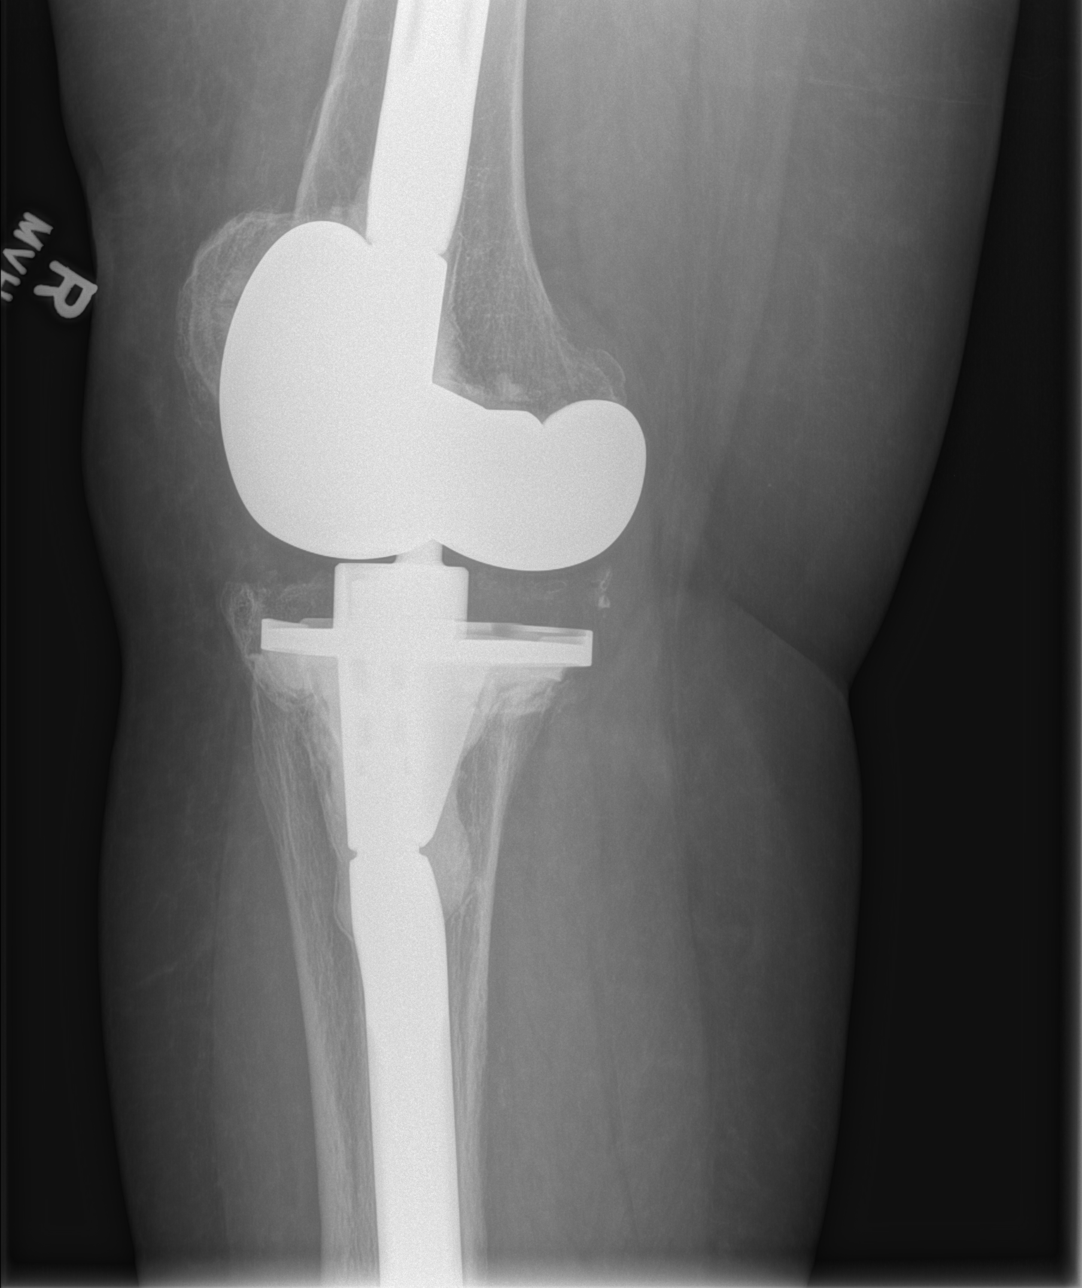

[x knee lat right]
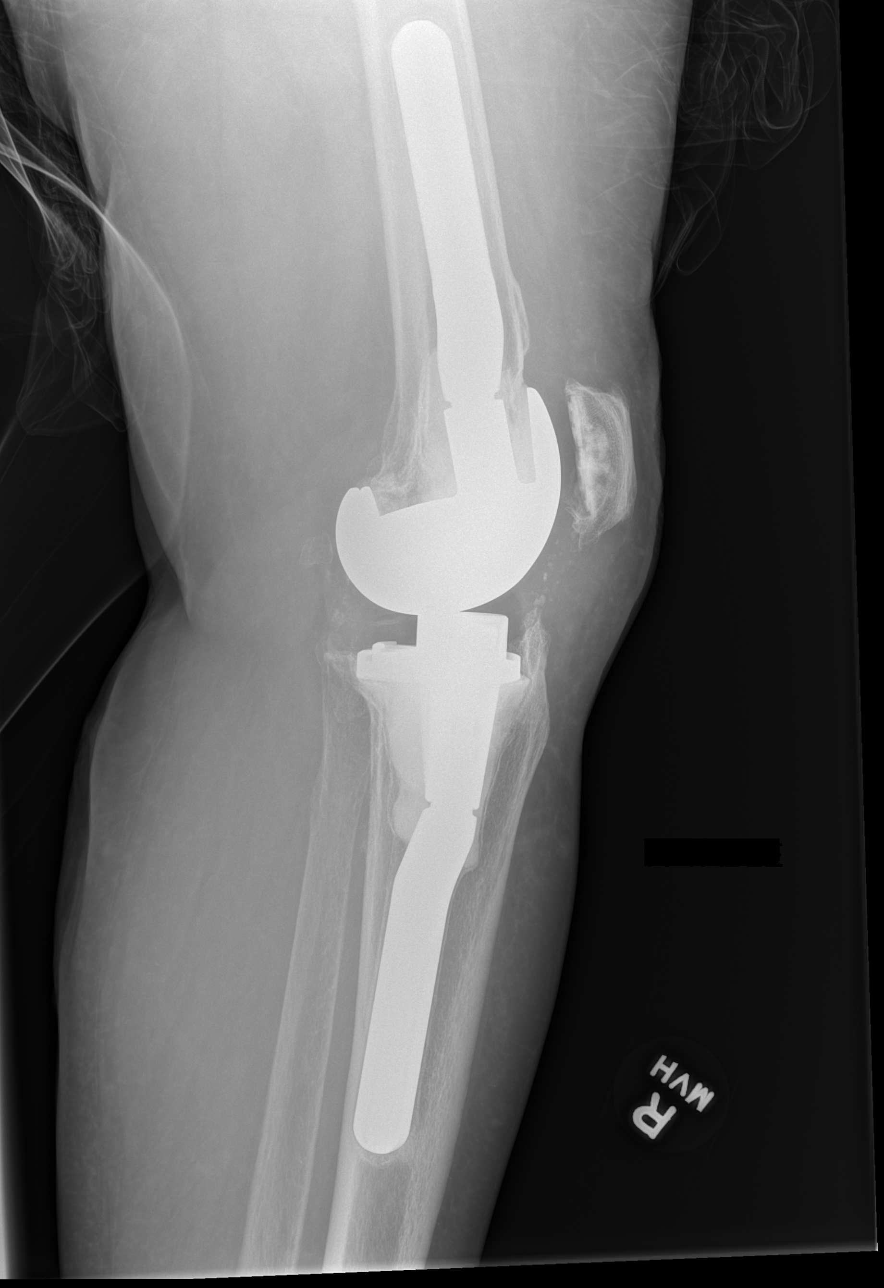

[4 of 4 positions shown; findings below may reference images not displayed]

FINDINGS: There is no fracture or dislocation or other acute
abnormality.  The knee prosthesis is in good position with no
evidence of loosening.  No acute soft tissue abnormality.
IMPRESSION: No acute abnormality.  Osteopenia.

## 2015-03-26 ENCOUNTER — Telehealth: Payer: Self-pay

## 2015-03-26 DIAGNOSIS — C4499 Other specified malignant neoplasm of skin, unspecified: Secondary | ICD-10-CM

## 2015-03-26 MED ORDER — BETAMETHASONE VALERATE 0.1 % EX OINT: 1.0000 "application " | TOPICAL_OINTMENT | Freq: Two times a day (BID) | CUTANEOUS | Status: DC

## 2015-03-26 NOTE — Telephone Encounter (Signed)
Patient called for refill on Betamethasone valerate ointmant 0.1%  , Melissa Cross , APNP aware . Ok to refill with 3 refills.

## 2015-05-16 ENCOUNTER — Encounter: Payer: Self-pay | Admitting: Gynecologic Oncology

## 2015-05-16 ENCOUNTER — Ambulatory Visit (HOSPITAL_COMMUNITY)
Admission: RE | Admit: 2015-05-16 | Discharge: 2015-05-16 | Disposition: A | Discharge status: Home / Self Care | Payer: Medicare Other | Source: Ambulatory Visit | Attending: Gynecologic Oncology | Admitting: Gynecologic Oncology

## 2015-05-16 ENCOUNTER — Telehealth: Payer: Self-pay

## 2015-05-16 ENCOUNTER — Ambulatory Visit (HOSPITAL_BASED_OUTPATIENT_CLINIC_OR_DEPARTMENT_OTHER): Payer: Medicare Other | Admitting: Gynecologic Oncology

## 2015-05-16 VITALS — BP 152/74 | HR 82 | Temp 97.9°F | Resp 21 | Ht 65.0 in | Wt 249.0 lb

## 2015-05-16 DIAGNOSIS — K5900 Constipation, unspecified: Secondary | ICD-10-CM | POA: Diagnosis present

## 2015-05-16 DIAGNOSIS — C519 Malignant neoplasm of vulva, unspecified: Secondary | ICD-10-CM

## 2015-05-16 DIAGNOSIS — R109 Unspecified abdominal pain: Secondary | ICD-10-CM | POA: Insufficient documentation

## 2015-05-16 DIAGNOSIS — C4499 Other specified malignant neoplasm of skin, unspecified: Secondary | ICD-10-CM

## 2015-05-16 MED ORDER — CLOTRIMAZOLE-BETAMETHASONE 1-0.05 % EX CREA: TOPICAL_CREAM | Freq: Two times a day (BID) | CUTANEOUS | Status: DC

## 2015-05-16 NOTE — Patient Instructions (Signed)
Plan to go to [facility] for your abdominal xray.  We will call you with the results.  Plan to follow up with Dr. Alycia Rossetti in three months or sooner if needed.

## 2015-05-16 NOTE — Telephone Encounter (Signed)
Dr [name] reviewed Abdomen 2 view radiology report obtained today for c/o constipation , findings "moderate stool burden" . Recommendations , patient to initiate Miralax or what ever worked in the past today . Attempted to reach the patient , no answer , left a detailed message with call back requested , contact information provided.

## 2015-05-16 NOTE — Progress Notes (Signed)
Consult Note: Gyn-Onc  [name] 69 y.o. female  CC:  Chief Complaint  Patient presents with  . 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 Some of the surgical margins were positive. Her postoperative course was uncomplicated. She was last seen by our service in 12/14. At that time there was no evidence of any lesions consistent with Paget's. However, there is considerable amount of excoriations throughout the entire vulvar and medial thighs consistent with chronic vulvitis.In December 2013 she had a hernia repair due to bowel obstruction by Dr. Excell Seltzer with permanent mesh. In May 2013 she also had a TVT bladder by Dr. Everett Graff. She does continue to wear a pad and also takes Detrol twice daily but states that her urinary leakage is markedly improved. She described the vulvar irritation is intermittent itching with burning and that it has increased over time as has the pruritus. She does use a steroid cream twice daily and does scratch her vulva.   She underwent a wide local excision of the left vulva on January 2 of year 2014. Operative findings included 2 separate 3 x 3 and 1.2 cm lesions consistent with Paget's on the left.  Pathology revealed 2 foci of extramammary Paget's disease extending to the left inked margin in the right and anterior tip margins at multiple foci. The posterior margin was negative for malignancy.   I performed a biopsy of this it was consistent with recurrent Paget's disease. On 11/23/2012 showed a repeat wide local excision of liver x2 with the posterior fourchette biopsy. Findings revealed along the left labia minora/vagina is a Paget's disease measuring approximately 2.5 x 1 cm in the superior aspect of her prior left vulvar excision. The inferior aspect of a similar lesion. 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  On the left perineal body the area is somewhat excoriated pale. Biopsy was performed in this area.  Pathology revealed:  1. Vulva, biopsy, left inferior - EXTRAMAMMARY PAGET DISEASE EXTENDING TO THE EDGES OF THE BIOPSY. 2. Vulva, excision, left superior lesion suture marks 1200 - EXTRAMAMMARY PAGET DISEASE. - MARGINS NOT INVOLVED. - CLOSEST MARGIN 9 O'CLOCK, LESS THAN 0.1 CM. 3. Vulva, excision, left inferior excision suture marks 1200 - EXTRAMAMMARY PAGET DISEASE, 12 O'CLOCK MARGIN FOCALLY INVOLVED. - REMAINING MARGINS CLEAR.  We have discussed repeating surgery however she had multiple orthopedic issues had broken ankle. She was last seen by [name] in December 2014. Exam at that time was fairly unremarkable.  I had seen her in January of 2015. At that time exam was concerning for Paget's.   On 08/02/2013 she underwent wide local excisions of the vulva x2.  Operative findings included pale pink raised lesion measuring 1 x 0.5 cm the left upper vulva near the prior excision site. There is a pale raised lesion measuring 1.5 x 1 cm on the left crossing midline perineal body.  Pathology revealed left superior vulvar excision with extramammary Paget's disease extending to the 9:00 margin. The other margins were negative for tumor. Of the left of midline lesion she extramammary Paget's disease extending to the 12:00 margin and focally at the 6:00 margin but otherwise negative margins. The patient at that time discussed with [place] that she was having some right lower quadrant pain. Her exam is very difficult secondary to habitus. There ultrasound was  obtained. Ultrasound revealed:  FINDINGS:  Uterus Measurements: 4.6 x 3.5 x 3.8 cm. 2.5 x 1.7 x 1.6 cm uterine fibroid. This is present in the right lateral aspect of the body uterus.  Endometrium Thickness: 6.1 mm. No focal abnormality visualized.  Right ovary: No focal abnormality.  Left ovary :No focal abnormality.  Other findings: No free  fluid  IMPRESSION:  Uterine fibroid otherwise negative exam.  I saw her March 16, 2014 at which time there was an area on the left vulva with hyperkeratosis. A biopsy of that area was performed consistent with Paget's disease. On March 29, 2014 she underwent wide local excision of the vulva. On exam she had a 1 cm left vulvar lesion just below the left labia minora. She underwent a wide local excision. He received a 3.7 x 1.7 x 0.3 cm lesion. There was extramammary Paget's disease extending to the 6 to 9:00 margin in the 12:00 margin. This is despite grossly negative margins on physical examination.   She had an excision in November that revealed Diagnosis Vulva, excision, w/stitch @ 1200 - EXTRAMAMMARY PAGET'S DISEASE, EXTENDING TO THE 6-9 O'CLOCK MARGIN AND 12 O'CLOCK PERPENDICULAR MARGIN.  I saw her in February 2016 at which time there was concern for recurrent disease. She decided to proceed with Aldara therapy. She used for approximate 4 weeks and then decided to stop it secondary to expensive medication. She then started using the clobetasol. While she was using the Aldara all the pruritus in her vulva resolved itself. Since she stopped it and was using the steroid cream the pruritus returned.  I saw her in May 2016 at which time she has small area consistent with Paget's on the right labia minora and a larger patch on the left. Biopsy on the right revealed extramammary Paget's. After discussion she wished to try the Aldara again. She called and requested an earlier appointment as for the last 2-3 weeks had increased itching and has been scratching so much that she's had some bleeding. I last saw her on July 6. At that time she had been having a lot of symptoms of pruritus and scratching a great deal. Vulvar examination at that time revealed a fairly macerated vulva and it was possible to discern what could be related to Paget's, effect of the Aldara therapy, trauma from scratching. She was  asked to stop the Aldara therapy and use a low-dose daily cream. We asked her to refrain from scratching come in today for evaluation.  I saw her again December 26, 2104 at which time there was evidence clearly a Paget's disease and she scheduled for surgery. On January 24, 2015 she underwent bilateral wide local excisions of the vulva. On the right vulva there was a 2 cm patch of Paget's disease in the mid labia majora. On the left side. The remaining labia minora in the area prior to the excision are smaller lesion consistent with Paget's disease.  Diagnosis 1. Vulva, excision, left wide excision - EXTRAMAMMARY PAGET'S DISEASE EXTENDING TO THE 12 O'CLOCK, 3 O'CLOCK, 6 O'CLOCK AND 9 O'CLOCK MARGINS. 2. Vulva, excision, right wide excision - EXTRAMAMMARY PAGET'S EXTENDING TO THE 3 O'CLOCK MARGIN AND THE 6 O'CLOCK POLE.  She comes in today for follow-up. The biggest issue she is accompanied by her surgery bowels. She states her last "good" bowel movement was 2 weeks ago. She complains of abdominal cramping. She states that she's taking Mirapex, stool softeners, Dulcolax. She does have bowel movements almost every day but she  does not describe them as "good" and they're small and hard. She passes gas constantly. She states that she is up-to-date on her colonoscopies with her next one being due in about 3 years. She denies any change in the stool caliber or any bleeding. She has previously also use milk of magnesia without success. They've increasing heartburn and reflux. She last saw her primary physician November 30 was not really having the symptoms at that time. Her primary physician is Dr. Management consultant. She is complaining of some bilateral groin and vulvar itching. She is trying hard not to scratch.   Review of Systems  Constitutional:  Denies fever. Skin: + itching Cardiovascular: No chest pain, shortness of breath, or edema  Pulmonary: No cough  Gastro Intestinal: Reporting intermittent lower abdominal  soreness.  No nausea, vomiting, _+constipation, or diarrhea reported. No bright red blood per rectum. Genitourinary: No frequency, urgency, or dysuria.  Denies vaginal bleeding and discharge.  Musculoskeletal: + chronic joint issues Neurologic: Uses walker  Current Meds:  Outpatient Encounter Prescriptions as of 05/16/2015  Medication Sig  . albuterol (PROVENTIL HFA;VENTOLIN HFA) 108 (90 BASE) MCG/ACT inhaler Inhale 2 puffs into the lungs every 4 (four) hours as needed for wheezing or shortness of breath.  Marland Kitchen albuterol (PROVENTIL) (2.5 MG/3ML) 0.083% nebulizer solution Take 2.5 mg by nebulization every 6 (six) hours as needed for wheezing or shortness of breath.  . beta carotene w/minerals (OCUVITE) tablet Take 2 tablets by mouth daily.   Marland Kitchen buPROPion (WELLBUTRIN XL) 150 MG 24 hr tablet Take 150 mg by mouth every morning.   . Calcium Carbonate-Vitamin D 600-125 MG-UNIT TABS Take 1 tablet by mouth daily.   . Cholecalciferol (VITAMIN D3) 1000 UNITS CAPS Take 2 capsules by mouth daily.  . citalopram (CELEXA) 40 MG tablet Take 40 mg by mouth every morning.   . docusate sodium (COLACE) 100 MG capsule Take 300 mg by mouth daily.   Marland Kitchen levothyroxine (SYNTHROID, LEVOTHROID) 175 MCG tablet TAKE 1 TABLET (175 MCG TOTAL) BY MOUTH DAILY.--  TAKING NAME BRAND SYNTHROID--  TAKES IN AM  . Multiple Vitamins-Minerals (MULTIVITAMIN GUMMIES ADULT) CHEW Chew 2 tablets by mouth daily.  . Omega-3 1000 MG CAPS Take 1 g by mouth daily.   . pantoprazole (PROTONIX) 40 MG tablet Take 40 mg by mouth every morning.   . polyvinyl alcohol (LIQUIFILM TEARS) 1.4 % ophthalmic solution Place 1 drop into both eyes as needed for dry eyes.  Marland Kitchen spironolactone (ALDACTONE) 25 MG tablet Take 0.5-1 tablets (12.5-25 mg total) by mouth as needed (for swelling).  . [DISCONTINUED] betamethasone valerate ointment (VALISONE) 0.1 % Apply 1 application topically 2 (two) times daily.   No facility-administered encounter medications on file as of  05/16/2015.    Allergy:  Allergies  Allergen Reactions  . Codeine Other (See Comments)    Hyper/ Hallucinations   . Demerol [Meperidine] Other (See Comments)    "Knocks me out"  . Diazepam Other (See Comments)    Extreme sedation; patient does not recall taking diazepam (12/07/14)  . Tape Hives and Other (See Comments)    "thick clear plastic tape" causes blisters  . Morphine And Related Itching and Rash    Per CRNA, pt tol Fentanyl IV for OR case, no issues reported to rec RN in PACU, no immediate PACU issues noted as well upon arrival  . Penicillins Hives and Swelling    Social Hx:   Social History   Social History  . Marital Status: Married    Spouse  Name: N/A  . Number of Children: N/A  . Years of Education: N/A   Occupational History  . Not on file.   Social History Main Topics  . Smoking status: Former Smoker -- 2.00 packs/day for 41 years    Types: Cigarettes    Quit date: 06/05/1999  . Smokeless tobacco: Never Used  . Alcohol Use: No  . Drug Use: No  . Sexual Activity: Not on file   Other Topics Concern  . Not on file   Social History Narrative    Past Surgical Hx:  Past Surgical History  Procedure Laterality Date  . Foot surgery Left 02-01-2009    TRIPLE ARTHRODESIS  . Rotator cuff repair Right   . Incisional hernia repair  05/05/2011    Procedure: LAPAROSCOPIC INCISIONAL HERNIA;  Surgeon: Edward Jolly, MD;  Location: [facility];  Service: General;  Laterality: N/A;  LAPAROSCOPIC REPAIR INCARCERATED INCISIONAL HERNIA  WITH MESH  . Bladder suspension  10/13/2011    Procedure: TRANSVAGINAL TAPE (TVT) PROCEDURE;  Surgeon: Delice Lesch, MD;  Location: [facility];  Service: Gynecology;  Laterality: N/A;  . Cystoscopy  10/13/2011    Procedure: CYSTOSCOPY;  Surgeon: Delice Lesch, MD;  Location: [facility];  Service: Gynecology;  Laterality: Bilateral;  . Vulvectomy  05/27/2012    Procedure: WIDE EXCISION VULVECTOMY;  Surgeon: Imagene Gurney A. Alycia Rossetti, MD;  Location:  [facility];  Service: Gynecology;  Laterality: N/A;  Wide Local Excision of Vulva   . Vulvectomy N/A 11/23/2012    Procedure: WIDE LOCAL EXCISION OF THE VULVA;  Surgeon: Imagene Gurney A. Alycia Rossetti, MD;  Location: [facility];  Service: Gynecology;  Laterality: N/A;  left inferior vulvar biopsy excision left superior lesion left inferior vulvar excision  . Foot surgery Left 11/14    I&D left foot with woundvac placement  . Vulvectomy N/A 08/02/2013    Procedure: WIDE LOCAL  EXCISION VULVA;  Surgeon: Imagene Gurney A. Alycia Rossetti, MD;  Location: [facility];  Service: Gynecology;  Laterality: N/A;  . Tonsillectomy  1968  . Lumbar spine surgery  1998    L5 -- S1  . Dilation and curettage of uterus  1968  . Breast lumpectomy Left 03-27-2004  . Mastectomy Right 1997    W/  LYMPH NODE DISSECTION AND RECONSTRUCTION WITH IMPLANTS AND EXPANDER  . Thyroidectomy, partial  1983  . Tendon repair right ring finger  2011  . Laparoscopic cholecystectomy  06-21-2010  . Knee arthroscopy Bilateral left 01-1984 & 04-1992/   right [id number]  . Vulvectomy partial  07-16-2010  . Cardiac catheterization  10-30-2000  dr Wynonia Lawman    normal coronary arteries and LVF/  ef 65-70%  . Removal right breast implant and capsulectomy  06-03-1999  . Thumb trigger release Right 1993  . Total knee arthroplasty Bilateral right 05-04-2000/  left 03-29-2001  . Closed manipulation  w/ open reduction exchange tibial femoral bearing post total left knee  04-14-2001  . Revision total knee arthroplasty Left 07-07-2005  &  12-10-2001    12-10-2001  REMOVAL TOTAL KNEE/ I&D / INSERTION ANTIBIOTIC SPACER  . Revision total knee arthroplasty Right 07-07-2005  . Port-a-cath placement and removal  1997  . Vulvectomy Left 03/29/2014    Procedure: WIDE LOCAL EXCISION OF LEFT VULVA ;  Surgeon: Imagene Gurney A. Alycia Rossetti, MD;  Location: [facility];  Service: Gynecology;  Laterality: Left;  . Vulvectomy Bilateral 01/24/2015    Procedure: WIDE LOCAL EXCISION VULVA;  Surgeon: [name], MD;  [facility];  Service: Gynecology;  Laterality: Bilateral;    Past Medical Hx:  Past Medical History  Diagnosis Date  . Macular degeneration of right eye   . Seasonal allergies   . GERD (gastroesophageal reflux disease)   . History of breast cancer no recurrence    1997  right breast cancer  s/p  mastectomy w/ snl dissection and chemoradiation/  2005  left breast cancer DCIS  s/p  lumpectomy and radiation  . Depression   . Hypothyroidism   . Asthma   . Fibromyalgia   . Arthritis   . Wears glasses   . Anxiety   . Numbness of left foot     4 toes  . History of colon polyps     2007 hyperplagia  . History of esophageal dilatation     2008  . History of small bowel obstruction     2012  . History of peptic ulcer   . Paget's disease of vulva   . Chronic constipation   . SUI (stress urinary incontinence, female)   . At risk for sleep apnea     STOP-BANG= 4     SENT TO PCP 03-27-2014  . History of acute bronchitis     BEGINNING OF AUG 2016--  NO SYMPTOM FREE  . PONV (postoperative nausea and vomiting)     and HARD TO WAKE    Family Hx:  Family History  Problem Relation Age of Onset  . Cancer Father 72    lung and kidney   . Alzheimer's disease Mother   . Alcohol abuse Brother   . Heart attack Maternal Grandmother     Vitals:  Blood pressure 152/74, pulse 82, temperature 97.9 F (36.6 C), temperature source Oral, resp. rate 21, height 5\' 5"  (1.651 m), weight 249 lb (112.946 kg), SpO2 97 %.  Physical Exam: Well-nourished well-developed female in no acute distress.  Abdomen: Obese, soft slightly tympanitic. Not distended. Exam is limited by habitus.  Pelvic: Well-healed surgical incisions. There are no dominant lesions today. The right vulva is fairly unremarkable. There is some evidence of skin cracking in the right groin. The left lobe was notable for multiple surgical excisions. There are no suspicious lesions for biopsy today.  Rectal no stool in the vault.  Assessment/Plan: 69 year old with recurrent vulvar Paget's disease. She does not have any areas that we need to biopsy today. She would like to have a clotrimazole called back to her pharmacy so she can use that on a when necessary basis. We will obtain an abdominal x-ray today secondary to her constipation that shows moderate stool burden without any dilated loops. We will inform her of these results. She'll need to follow-up with Dr. Melford Aase regarding her symptoms to see if any other workup is indicated at this time. On rectal exam today there is not a large volume of stool in the vault. She'll return to see me in 3 months.  Staci Dack A., MD 05/16/2015, 11:36 AM

## 2015-05-17 ENCOUNTER — Telehealth: Payer: Self-pay

## 2015-05-17 NOTE — Telephone Encounter (Signed)
Patient returned call as requested , patient updated with radiology report of "moderate stool burden" and Dr Elenora Gamma recommendations. Patient instructed to take miralax 2 capfulls daily , patient states she has been doing that and it is not "working" . Patient instructed to increase the Miralax  to 2 capfuls of one in the AM and the other in the evening and increase her fluid intake.. Patient states understanding , denies further questions at this time. Patient instructed to if she does not have a BM in 2 days she should call us , patient agreed to plan.

## 2015-07-11 ENCOUNTER — Ambulatory Visit (INDEPENDENT_AMBULATORY_CARE_PROVIDER_SITE_OTHER): Payer: Medicare Other | Admitting: Podiatry

## 2015-07-11 ENCOUNTER — Ambulatory Visit (INDEPENDENT_AMBULATORY_CARE_PROVIDER_SITE_OTHER): Payer: Medicare Other

## 2015-07-11 ENCOUNTER — Encounter: Payer: Self-pay | Admitting: Podiatry

## 2015-07-11 VITALS — Resp 16

## 2015-07-11 DIAGNOSIS — M79675 Pain in left toe(s): Secondary | ICD-10-CM | POA: Diagnosis not present

## 2015-07-11 DIAGNOSIS — L03032 Cellulitis of left toe: Secondary | ICD-10-CM | POA: Diagnosis not present

## 2015-07-11 DIAGNOSIS — L600 Ingrowing nail: Secondary | ICD-10-CM | POA: Diagnosis not present

## 2015-07-11 DIAGNOSIS — L03012 Cellulitis of left finger: Secondary | ICD-10-CM

## 2015-07-11 NOTE — Patient Instructions (Signed)

## 2015-07-11 NOTE — Progress Notes (Signed)
Subjective:     Patient ID: NOVALEIGH MOCHEL, female   DOB: [birth year], 70 y.o.   MRN: [id number]  HPI patient states that his right hallux nail has been sore and crusted and hard for him to wear shoe gear with and that there is been some kind of redness in the left big toenail after she had a pedicure 10 days ago and she's unsure about   Review of Systems  Neurological: Syncope:  inflamed area that may have some localized drainage. I did not note any proximal edema erythema or drainage noted.  All other systems reviewed and are negative.      Objective:   Physical Exam Above    Assessment:     Ingrown toenail with damage right hallux and paronychia infection left hallux with pain in the foot in general which may be related to bone or soft tissue    Plan:     H&P and both conditions reviewed and x-rays left reviewed. At this time I infiltrated each hallux 60 mg like Marcaine mixture and using sterile his mentation I opened up the area on the left with a small little localized abscess and I did not note any acute process and I went ahead flushed the area and applied sterile dressing and gave strict instructions for soaks and if there should be any change to let us know we may consider antibiotic treatment. The right I went ahead and recommended removal of the nail that is left and explained procedure and risk and patient wants surgery and today I infiltrated 60 mg I can Marcaine mixture removed the nail left and apply chemical phenol 3 applications 30 seconds followed by alcohol lavage and sterile dressing. Gave instructions on soaks and reappoint

## 2015-08-01 ENCOUNTER — Ambulatory Visit: Payer: Medicare Other | Attending: Gynecologic Oncology | Admitting: Gynecologic Oncology

## 2015-08-01 ENCOUNTER — Encounter: Payer: Self-pay | Admitting: Gynecologic Oncology

## 2015-08-01 VITALS — BP 135/86 | HR 76 | Temp 98.3°F | Resp 18 | Ht 65.0 in | Wt 227.1 lb

## 2015-08-01 DIAGNOSIS — K219 Gastro-esophageal reflux disease without esophagitis: Secondary | ICD-10-CM | POA: Insufficient documentation

## 2015-08-01 DIAGNOSIS — E039 Hypothyroidism, unspecified: Secondary | ICD-10-CM | POA: Diagnosis not present

## 2015-08-01 DIAGNOSIS — K5909 Other constipation: Secondary | ICD-10-CM | POA: Diagnosis not present

## 2015-08-01 DIAGNOSIS — Z9889 Other specified postprocedural states: Secondary | ICD-10-CM | POA: Insufficient documentation

## 2015-08-01 DIAGNOSIS — F419 Anxiety disorder, unspecified: Secondary | ICD-10-CM | POA: Insufficient documentation

## 2015-08-01 DIAGNOSIS — M797 Fibromyalgia: Secondary | ICD-10-CM | POA: Diagnosis not present

## 2015-08-01 DIAGNOSIS — D071 Carcinoma in situ of vulva: Secondary | ICD-10-CM

## 2015-08-01 DIAGNOSIS — H3530 Unspecified macular degeneration: Secondary | ICD-10-CM | POA: Diagnosis not present

## 2015-08-01 DIAGNOSIS — Z87891 Personal history of nicotine dependence: Secondary | ICD-10-CM | POA: Insufficient documentation

## 2015-08-01 DIAGNOSIS — C519 Malignant neoplasm of vulva, unspecified: Secondary | ICD-10-CM | POA: Insufficient documentation

## 2015-08-01 DIAGNOSIS — Z86010 Personal history of colonic polyps: Secondary | ICD-10-CM | POA: Diagnosis not present

## 2015-08-01 DIAGNOSIS — Z853 Personal history of malignant neoplasm of breast: Secondary | ICD-10-CM | POA: Insufficient documentation

## 2015-08-01 DIAGNOSIS — F329 Major depressive disorder, single episode, unspecified: Secondary | ICD-10-CM | POA: Insufficient documentation

## 2015-08-01 DIAGNOSIS — J45909 Unspecified asthma, uncomplicated: Secondary | ICD-10-CM | POA: Insufficient documentation

## 2015-08-01 DIAGNOSIS — C4499 Other specified malignant neoplasm of skin, unspecified: Secondary | ICD-10-CM

## 2015-08-01 NOTE — Progress Notes (Signed)
Consult Note: Gyn-Onc  [name] 70 y.o. female  CC:  Chief Complaint  Patient presents with  . 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 Some of the surgical margins were positive. Her postoperative course was uncomplicated. She was last seen by our service in 12/14. At that time there was no evidence of any lesions consistent with Paget's. However, there is considerable amount of excoriations throughout the entire vulvar and medial thighs consistent with chronic vulvitis.In December 2013 she had a hernia repair due to bowel obstruction by Dr. Excell Seltzer with permanent mesh. In May 2013 she also had a TVT bladder by Dr. Everett Graff. She does continue to wear a pad and also takes Detrol twice daily but states that her urinary leakage is markedly improved. She described the vulvar irritation is intermittent itching with burning and that it has increased over time as has the pruritus. She does use a steroid cream twice daily and does scratch her vulva.   She underwent a wide local excision of the left vulva on January 2 of year 2014. Operative findings included 2 separate 3 x 3 and 1.2 cm lesions consistent with Paget's on the left.  Pathology revealed 2 foci of extramammary Paget's disease extending to the left inked margin in the right and anterior tip margins at multiple foci. The posterior margin was negative for malignancy.   I performed a biopsy of this it was consistent with recurrent Paget's disease. On 11/23/2012 showed a repeat wide local excision of liver x2 with the posterior fourchette biopsy. Findings revealed along the left labia minora/vagina is a Paget's disease measuring approximately 2.5 x 1 cm in the superior aspect of her prior left vulvar excision. The inferior aspect of a similar lesion. 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 On the left perineal body the area is somewhat excoriated pale. Biopsy was performed in this area.  Pathology revealed:  1. Vulva, biopsy, left inferior - EXTRAMAMMARY PAGET DISEASE EXTENDING TO THE EDGES OF THE BIOPSY. 2. Vulva, excision, left superior lesion suture marks 1200 - EXTRAMAMMARY PAGET DISEASE. - MARGINS NOT INVOLVED. - CLOSEST MARGIN 9 O'CLOCK, LESS THAN 0.1 CM. 3. Vulva, excision, left inferior excision suture marks 1200 - EXTRAMAMMARY PAGET DISEASE, 12 O'CLOCK MARGIN FOCALLY INVOLVED. - REMAINING MARGINS CLEAR.  We have discussed repeating surgery however she had multiple orthopedic issues had broken ankle. She was last seen by [name] in December 2014. Exam at that time was fairly unremarkable.  I had seen her in January of 2015. At that time exam was concerning for Paget's.   On 08/02/2013 she underwent wide local excisions of the vulva x2.  Operative findings included pale pink raised lesion measuring 1 x 0.5 cm the left upper vulva near the prior excision site. There is a pale raised lesion measuring 1.5 x 1 cm on the left crossing midline perineal body.  Pathology revealed left superior vulvar excision with extramammary Paget's disease extending to the 9:00 margin. The other margins were negative for tumor. Of the left of midline lesion she extramammary Paget's disease extending to the 12:00 margin and focally at the 6:00 margin but otherwise negative margins. The patient at that time discussed with [place] that she was having some right lower quadrant pain. Her exam is very difficult secondary to habitus. There ultrasound  was obtained. Ultrasound revealed:  FINDINGS:  Uterus Measurements: 4.6 x 3.5 x 3.8 cm. 2.5 x 1.7 x 1.6 cm uterine fibroid. This is present in the right lateral aspect of the body uterus.  Endometrium Thickness: 6.1 mm. No focal abnormality visualized.  Right ovary: No focal abnormality.  Left ovary :No focal abnormality.  Other findings: No  free fluid  IMPRESSION:  Uterine fibroid otherwise negative exam.  I saw her March 16, 2014 at which time there was an area on the left vulva with hyperkeratosis. A biopsy of that area was performed consistent with Paget's disease. On March 29, 2014 she underwent wide local excision of the vulva. On exam she had a 1 cm left vulvar lesion just below the left labia minora. She underwent a wide local excision. He received a 3.7 x 1.7 x 0.3 cm lesion. There was extramammary Paget's disease extending to the 6 to 9:00 margin in the 12:00 margin. This is despite grossly negative margins on physical examination.   She had an excision in November that revealed Diagnosis Vulva, excision, w/stitch @ 1200 - EXTRAMAMMARY PAGET'S DISEASE, EXTENDING TO THE 6-9 O'CLOCK MARGIN AND 12 O'CLOCK PERPENDICULAR MARGIN.  I saw her in February 2016 at which time there was concern for recurrent disease. She decided to proceed with Aldara therapy. She used for approximate 4 weeks and then decided to stop it secondary to expensive medication. She then started using the clobetasol. While she was using the Aldara all the pruritus in her vulva resolved itself. Since she stopped it and was using the steroid cream the pruritus returned.  I saw her in May 2016 at which time she has small area consistent with Paget's on the right labia minora and a larger patch on the left. Biopsy on the right revealed extramammary Paget's. After discussion she wished to try the Aldara again. She called and requested an earlier appointment as for the last 2-3 weeks had increased itching and has been scratching so much that she's had some bleeding. I last saw her on July 6. At that time she had been having a lot of symptoms of pruritus and scratching a great deal. Vulvar examination at that time revealed a fairly macerated vulva and it was possible to discern what could be related to Paget's, effect of the Aldara therapy, trauma from scratching. She  was asked to stop the Aldara therapy and use a low-dose daily cream. We asked her to refrain from scratching come in today for evaluation.  I saw her again December 26, 2104 at which time there was evidence clearly a Paget's disease and she scheduled for surgery. On January 24, 2015 she underwent bilateral wide local excisions of the vulva. On the right vulva there was a 2 cm patch of Paget's disease in the mid labia majora. On the left side. The remaining labia minora in the area prior to the excision are smaller lesion consistent with Paget's disease.  Diagnosis 1. Vulva, excision, left wide excision - EXTRAMAMMARY PAGET'S DISEASE EXTENDING TO THE 12 O'CLOCK, 3 O'CLOCK, 6 O'CLOCK AND 9 O'CLOCK MARGINS. 2. Vulva, excision, right wide excision - EXTRAMAMMARY PAGET'S EXTENDING TO THE 3 O'CLOCK MARGIN AND THE 6 O'CLOCK POLE.  She comes in today for follow-up. I last saw her in December 2016. She does have some itching but much less than she has in the past. She was is her clobetasol cream when necessary. There been no other major issues for her healthwise.  Current Meds:  Outpatient Encounter  Prescriptions as of 08/01/2015  Medication Sig  . beta carotene w/minerals (OCUVITE) tablet Take 2 tablets by mouth daily.   Marland Kitchen buPROPion (WELLBUTRIN XL) 150 MG 24 hr tablet Take 150 mg by mouth every morning.   . Calcium Carbonate-Vitamin D 600-125 MG-UNIT TABS Take 1 tablet by mouth daily.   . Cholecalciferol (VITAMIN D3) 1000 UNITS CAPS Take 2 capsules by mouth daily.  . citalopram (CELEXA) 40 MG tablet Take 40 mg by mouth every morning.   . clotrimazole-betamethasone (LOTRISONE) cream Apply topically 2 (two) times daily.  Marland Kitchen docusate sodium (COLACE) 100 MG capsule Take 300 mg by mouth daily.   Marland Kitchen levothyroxine (SYNTHROID, LEVOTHROID) 175 MCG tablet TAKE 1 TABLET (175 MCG TOTAL) BY MOUTH DAILY.--  TAKING NAME BRAND SYNTHROID--  TAKES IN AM  . Multiple Vitamins-Minerals (MULTIVITAMIN GUMMIES ADULT) CHEW Chew 2  tablets by mouth daily.  . Omega-3 1000 MG CAPS Take 1 g by mouth daily.   . pantoprazole (PROTONIX) 40 MG tablet Take 40 mg by mouth every morning.   . polyvinyl alcohol (LIQUIFILM TEARS) 1.4 % ophthalmic solution Place 1 drop into both eyes as needed for dry eyes.  Marland Kitchen spironolactone (ALDACTONE) 25 MG tablet Take 0.5-1 tablets (12.5-25 mg total) by mouth as needed (for swelling).  Marland Kitchen albuterol (PROVENTIL HFA;VENTOLIN HFA) 108 (90 BASE) MCG/ACT inhaler Inhale 2 puffs into the lungs every 4 (four) hours as needed for wheezing or shortness of breath. Reported on 08/01/2015  . albuterol (PROVENTIL) (2.5 MG/3ML) 0.083% nebulizer solution Take 2.5 mg by nebulization every 6 (six) hours as needed for wheezing or shortness of breath. Reported on 08/01/2015   No facility-administered encounter medications on file as of 08/01/2015.    Allergy:  Allergies  Allergen Reactions  . Codeine Other (See Comments)    Hyper/ Hallucinations   . Demerol [Meperidine] Other (See Comments)    "Knocks me out"  . Diazepam Other (See Comments)    Extreme sedation; patient does not recall taking diazepam (12/07/14)  . Tape Hives and Other (See Comments)    "thick clear plastic tape" causes blisters  . Morphine And Related Itching and Rash    Per CRNA, pt tol Fentanyl IV for OR case, no issues reported to rec RN in PACU, no immediate PACU issues noted as well upon arrival  . Penicillins Hives and Swelling  . Valisone [Betamethasone] Rash    Social Hx:   Social History   Social History  . Marital Status: Married    Spouse Name: N/A  . Number of Children: N/A  . Years of Education: N/A   Occupational History  . Not on file.   Social History Main Topics  . Smoking status: Former Smoker -- 2.00 packs/day for 41 years    Types: Cigarettes    Quit date: 06/05/1999  . Smokeless tobacco: Never Used  . Alcohol Use: No  . Drug Use: No  . Sexual Activity: Not on file   Other Topics Concern  . Not on file   Social  History Narrative    Past Surgical Hx:  Past Surgical History  Procedure Laterality Date  . Foot surgery Left 02-01-2009    TRIPLE ARTHRODESIS  . Rotator cuff repair Right   . Incisional hernia repair  05/05/2011    Procedure: LAPAROSCOPIC INCISIONAL HERNIA;  Surgeon: Edward Jolly, MD;  Location: [facility];  Service: General;  Laterality: N/A;  LAPAROSCOPIC REPAIR INCARCERATED INCISIONAL HERNIA  WITH MESH  . Bladder suspension  10/13/2011  Procedure: TRANSVAGINAL TAPE (TVT) PROCEDURE;  Surgeon: Delice Lesch, MD;  Location: [facility];  Service: Gynecology;  Laterality: N/A;  . Cystoscopy  10/13/2011    Procedure: CYSTOSCOPY;  Surgeon: Delice Lesch, MD;  Location: [facility];  Service: Gynecology;  Laterality: Bilateral;  . Vulvectomy  05/27/2012    Procedure: WIDE EXCISION VULVECTOMY;  Surgeon: Imagene Gurney A. Alycia Rossetti, MD;  Location: [facility];  Service: Gynecology;  Laterality: N/A;  Wide Local Excision of Vulva   . Vulvectomy N/A 11/23/2012    Procedure: WIDE LOCAL EXCISION OF THE VULVA;  Surgeon: Imagene Gurney A. Alycia Rossetti, MD;  Location: [facility];  Service: Gynecology;  Laterality: N/A;  left inferior vulvar biopsy excision left superior lesion left inferior vulvar excision  . Foot surgery Left 11/14    I&D left foot with woundvac placement  . Vulvectomy N/A 08/02/2013    Procedure: WIDE LOCAL  EXCISION VULVA;  Surgeon: Imagene Gurney A. Alycia Rossetti, MD;  Location: [facility];  Service: Gynecology;  Laterality: N/A;  . Tonsillectomy  1968  . Lumbar spine surgery  1998    L5 -- S1  . Dilation and curettage of uterus  1968  . Breast lumpectomy Left 03-27-2004  . Mastectomy Right 1997    W/  LYMPH NODE DISSECTION AND RECONSTRUCTION WITH IMPLANTS AND EXPANDER  . Thyroidectomy, partial  1983  . Tendon repair right ring finger  2011  . Laparoscopic cholecystectomy  06-21-2010  . Knee arthroscopy Bilateral left 01-1984 & 04-1992/   right [phone]  . Vulvectomy partial  07-16-2010  . Cardiac catheterization  10-30-2000  dr  Wynonia Lawman    normal coronary arteries and LVF/  ef 65-70%  . Removal right breast implant and capsulectomy  06-03-1999  . Thumb trigger release Right 1993  . Total knee arthroplasty Bilateral right 05-04-2000/  left 03-29-2001  . Closed manipulation  w/ open reduction exchange tibial femoral bearing post total left knee  04-14-2001  . Revision total knee arthroplasty Left 07-07-2005  &  12-10-2001    12-10-2001  REMOVAL TOTAL KNEE/ I&D / INSERTION ANTIBIOTIC SPACER  . Revision total knee arthroplasty Right 07-07-2005  . Port-a-cath placement and removal  1997  . Vulvectomy Left 03/29/2014    Procedure: WIDE LOCAL EXCISION OF LEFT VULVA ;  Surgeon: Imagene Gurney A. Alycia Rossetti, MD;  Location: [facility];  Service: Gynecology;  Laterality: Left;  . Vulvectomy Bilateral 01/24/2015    Procedure: WIDE LOCAL EXCISION VULVA;  Surgeon: [name], MD;  Location: [facility];  Service: Gynecology;  Laterality: Bilateral;    Past Medical Hx:  Past Medical History  Diagnosis Date  . Macular degeneration of right eye   . Seasonal allergies   . GERD (gastroesophageal reflux disease)   . History of breast cancer no recurrence    1997  right breast cancer  s/p  mastectomy w/ snl dissection and chemoradiation/  2005  left breast cancer DCIS  s/p  lumpectomy and radiation  . Depression   . Hypothyroidism   . Asthma   . Fibromyalgia   . Arthritis   . Wears glasses   . Anxiety   . Numbness of left foot     4 toes  . History of colon polyps     2007 hyperplagia  . History of esophageal dilatation     2008  . History of small bowel obstruction     2012  . History of peptic ulcer   . Paget's disease of vulva   . Chronic constipation   .  SUI (stress urinary incontinence, female)   . At risk for sleep apnea     STOP-BANG= 4     SENT TO PCP 03-27-2014  . History of acute bronchitis     BEGINNING OF AUG 2016--  NO SYMPTOM FREE  . PONV (postoperative nausea and vomiting)     and  HARD TO WAKE    Family Hx:  Family History  Problem Relation Age of Onset  . Cancer Father 36    lung and kidney   . Alzheimer's disease Mother   . Alcohol abuse Brother   . Heart attack Maternal Grandmother     Vitals:  Blood pressure 135/86, pulse 76, temperature 98.3 F (36.8 C), temperature source Oral, resp. rate 18, height 5\' 5"  (1.651 m), weight 227 lb 1.6 oz (103.012 kg), SpO2 97 %.  Physical Exam: Well-nourished well-developed female in no acute distress.  Abdomen: Obese, soft slightly tympanitic. Not distended. Exam is limited by habitus.  Pelvic: Well-healed surgical incisions.The right vulva is fairly unremarkable. The left labia was notable for multiple surgical excisions. There is a small erythematous patch on the right labia that smaller than a centimeter. It is slightly velvety in appearance but not particularly concerning at this point. There are no significant lesions for biopsy today.   Assessment/Plan: 70 year old with recurrent vulvar Paget's disease. She does not have any areas that we need to biopsy today. While I'm concerned that this could represent a small area Paget she is very comfortable for short term follow-up will return to see me on May 17.  [name] A., MD 08/01/2015, 12:39 PM

## 2015-08-01 NOTE — Patient Instructions (Signed)
Plan to follow up in May 17 or sooner if needed.  Please call for any questions or concerns.

## 2015-10-04 ENCOUNTER — Ambulatory Visit
Admission: RE | Admit: 2015-10-04 | Discharge: 2015-10-04 | Disposition: A | Discharge status: Home / Self Care | Payer: Medicare Other | Source: Ambulatory Visit | Attending: General Surgery | Admitting: General Surgery

## 2015-10-04 ENCOUNTER — Other Ambulatory Visit: Payer: Self-pay | Admitting: General Surgery

## 2015-10-04 DIAGNOSIS — G8918 Other acute postprocedural pain: Secondary | ICD-10-CM

## 2015-10-04 DIAGNOSIS — R0789 Other chest pain: Principal | ICD-10-CM

## 2015-10-10 ENCOUNTER — Ambulatory Visit: Payer: Medicare Other | Admitting: Gynecologic Oncology

## 2015-10-10 ENCOUNTER — Telehealth: Payer: Self-pay | Admitting: Oncology

## 2015-10-10 NOTE — Telephone Encounter (Signed)
Left patient a voicemail to return my call to schedule appointment.

## 2015-10-11 ENCOUNTER — Telehealth: Payer: Self-pay | Admitting: *Deleted

## 2015-10-11 NOTE — Telephone Encounter (Signed)
"  I came in today to see Dr. Alycia Rossetti.  Must have gotton my dates mixed up because I was supposed to be there yesterday."  Call transferred to ext [phone].

## 2015-10-19 ENCOUNTER — Other Ambulatory Visit: Payer: Self-pay

## 2015-10-19 DIAGNOSIS — C50919 Malignant neoplasm of unspecified site of unspecified female breast: Secondary | ICD-10-CM

## 2015-10-23 ENCOUNTER — Ambulatory Visit (HOSPITAL_BASED_OUTPATIENT_CLINIC_OR_DEPARTMENT_OTHER): Payer: Medicare Other | Admitting: Oncology

## 2015-10-23 ENCOUNTER — Other Ambulatory Visit (HOSPITAL_BASED_OUTPATIENT_CLINIC_OR_DEPARTMENT_OTHER): Payer: Medicare Other

## 2015-10-23 VITALS — BP 153/83 | HR 80 | Temp 98.7°F | Resp 18 | Ht 65.0 in | Wt 222.4 lb

## 2015-10-23 DIAGNOSIS — D0512 Intraductal carcinoma in situ of left breast: Secondary | ICD-10-CM | POA: Insufficient documentation

## 2015-10-23 DIAGNOSIS — Z87891 Personal history of nicotine dependence: Secondary | ICD-10-CM | POA: Insufficient documentation

## 2015-10-23 DIAGNOSIS — Z853 Personal history of malignant neoplasm of breast: Secondary | ICD-10-CM

## 2015-10-23 DIAGNOSIS — C4499 Other specified malignant neoplasm of skin, unspecified: Secondary | ICD-10-CM

## 2015-10-23 DIAGNOSIS — C50911 Malignant neoplasm of unspecified site of right female breast: Secondary | ICD-10-CM

## 2015-10-23 DIAGNOSIS — Z86000 Personal history of in-situ neoplasm of breast: Secondary | ICD-10-CM | POA: Diagnosis not present

## 2015-10-23 DIAGNOSIS — C50919 Malignant neoplasm of unspecified site of unspecified female breast: Secondary | ICD-10-CM

## 2015-10-23 DIAGNOSIS — Z801 Family history of malignant neoplasm of trachea, bronchus and lung: Secondary | ICD-10-CM | POA: Diagnosis not present

## 2015-10-23 HISTORY — DX: Intraductal carcinoma in situ of left breast: D05.12

## 2015-10-23 LAB — CBC WITH DIFFERENTIAL/PLATELET
BASO%: 0.5 % (ref 0.0–2.0)
BASOS ABS: 0 10*3/uL (ref 0.0–0.1)
EOS ABS: 0.1 10*3/uL (ref 0.0–0.5)
EOS%: 1 % (ref 0.0–7.0)
HEMATOCRIT: 40.8 % (ref 34.8–46.6)
HEMOGLOBIN: 13.4 g/dL (ref 11.6–15.9)
LYMPH#: 1.4 10*3/uL (ref 0.9–3.3)
LYMPH%: 25.8 % (ref 14.0–49.7)
MCH: 28.7 pg (ref 25.1–34.0)
MCHC: 32.8 g/dL (ref 31.5–36.0)
MCV: 87.3 fL (ref 79.5–101.0)
MONO#: 0.3 10*3/uL (ref 0.1–0.9)
MONO%: 5.5 % (ref 0.0–14.0)
NEUT%: 67.2 % (ref 38.4–76.8)
NEUTROS ABS: 3.6 10*3/uL (ref 1.5–6.5)
Platelets: 138 10*3/uL — ABNORMAL LOW (ref 145–400)
RBC: 4.67 10*6/uL (ref 3.70–5.45)
RDW: 13.6 % (ref 11.2–14.5)
WBC: 5.4 10*3/uL (ref 3.9–10.3)

## 2015-10-23 LAB — COMPREHENSIVE METABOLIC PANEL
ALBUMIN: 3.9 g/dL (ref 3.5–5.0)
ALT: 14 U/L (ref 0–55)
AST: 17 U/L (ref 5–34)
Alkaline Phosphatase: 85 U/L (ref 40–150)
Anion Gap: 7 mEq/L (ref 3–11)
BUN: 12.2 mg/dL (ref 7.0–26.0)
CO2: 28 meq/L (ref 22–29)
Calcium: 9.7 mg/dL (ref 8.4–10.4)
Chloride: 107 mEq/L (ref 98–109)
Creatinine: 0.7 mg/dL (ref 0.6–1.1)
EGFR: 89 mL/min/{1.73_m2} — AB (ref >90)
GLUCOSE: 97 mg/dL (ref 70–140)
POTASSIUM: 4.7 meq/L (ref 3.5–5.1)
Sodium: 143 mEq/L (ref 136–145)
TOTAL PROTEIN: 6.6 g/dL (ref 6.4–8.3)
Total Bilirubin: 0.32 mg/dL (ref 0.20–1.20)

## 2015-10-23 NOTE — Progress Notes (Signed)
[facility]  Telephone:([phone]) [phone] Fax:(336) [phone]     ID: ARIADNE LANDO DOB: [birth year]  MR#: [id number]  [id number]  Patient Care Team: Chesley Noon, MD as PCP - General (Family Medicine) [name], MD as Attending Physician (Gynecologic Oncology) PCP: Chesley Noon, MD GYN: SU:  OTHER MD:  CHIEF COMPLAINT: Persistent chest wall pain  CURRENT TREATMENT: Workup in process   BREAST CANCER HISTORY: The patient has a remote history of right-sided breast cancer, status post modified radical mastectomy in 1997 for a stage II tumor, for which she received adjuvant chemotherapy and tamoxifen for six years. 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 She was treated with letrozole after that pointt, again for six years  For the last 6 months she has had right-sided chest wall pain, just behind the mastectomy incision. She was referred to Dr. Excell Seltzer who felt the pain was most likely musculoskeletal. He obtained a chest x-ray 10/04/2015 which showed no active disease.  He referred the patient to [place] for further evaluation and treatment  INTERVAL HISTORY: [name] was evaluated in the breast clinic 10/23/2015  REVIEW OF SYSTEMS: The pain involves the right breast area where she had surgery but it radiates to the back. Radiation is not entirely accurate. The pain involves the breast side of the chest and back on the left side on 1 fairly constantly. This pain was not present 6 or 7 months ago. She is not aware of any particular cause for the pain all she admits to some falls most recently about 6 weeks ago. She complains of blurred vision. Her dentures do not fit well. Occasionally her ankles swell. She is short of breath most of the time and she does have a greater than 40-pack-year smoking history. She quit smoking 16  years ago. She has a little bit of a dry cough. She is very constipated and sometimes can go 2 weeks at a time without a bowel movement unless she takes fairly stiff laxatives. She complains of urinary incontinence. In addition to the pain in the right side of her chest and right breast she has been in other joints, and she is known to have right rotator cuff problems. She is anxious and depressed. A detailed review of systems today was otherwise stable  PAST MEDICAL HISTORY: Past Medical History  Diagnosis Date  . Macular degeneration of right eye   . Seasonal allergies   . GERD (gastroesophageal reflux disease)   . History of breast cancer no recurrence    1997  right breast cancer  s/p  mastectomy w/ snl dissection and chemoradiation/  2005  left breast cancer DCIS  s/p  lumpectomy and radiation  . Depression   . Hypothyroidism   . Asthma   . Fibromyalgia   . Arthritis   . Wears glasses   . Anxiety   . Numbness of left foot     4 toes  . History of colon polyps     2007 hyperplagia  . History of esophageal dilatation     2008  . History of small bowel obstruction     2012  . History of peptic ulcer   . Paget's disease of vulva   . Chronic constipation   . SUI (stress urinary incontinence, female)   . At risk for sleep apnea     STOP-BANG= 4  SENT TO PCP 03-27-2014  . History of acute bronchitis     BEGINNING OF AUG 2016--  NO SYMPTOM FREE  . PONV (postoperative nausea and vomiting)     and HARD TO WAKE    PAST SURGICAL HISTORY: Past Surgical History  Procedure Laterality Date  . Foot surgery Left 02-01-2009    TRIPLE ARTHRODESIS  . Rotator cuff repair Right   . Incisional hernia repair  05/05/2011    Procedure: LAPAROSCOPIC INCISIONAL HERNIA;  Surgeon: Edward Jolly, MD;  Location: [facility];  Service: General;  Laterality: N/A;  LAPAROSCOPIC REPAIR INCARCERATED INCISIONAL HERNIA  WITH MESH  . Bladder suspension  10/13/2011    Procedure: TRANSVAGINAL TAPE (TVT)  PROCEDURE;  Surgeon: Delice Lesch, MD;  Location: [facility];  Service: Gynecology;  Laterality: N/A;  . Cystoscopy  10/13/2011    Procedure: CYSTOSCOPY;  Surgeon: Delice Lesch, MD;  Location: [facility];  Service: Gynecology;  Laterality: Bilateral;  . Vulvectomy  05/27/2012    Procedure: WIDE EXCISION VULVECTOMY;  Surgeon: Imagene Gurney A. Alycia Rossetti, MD;  Location: [facility];  Service: Gynecology;  Laterality: N/A;  Wide Local Excision of Vulva   . Vulvectomy N/A 11/23/2012    Procedure: WIDE LOCAL EXCISION OF THE VULVA;  Surgeon: Imagene Gurney A. Alycia Rossetti, MD;  Location: [facility];  Service: Gynecology;  Laterality: N/A;  left inferior vulvar biopsy excision left superior lesion left inferior vulvar excision  . Foot surgery Left 11/14    I&D left foot with woundvac placement  . Vulvectomy N/A 08/02/2013    Procedure: WIDE LOCAL  EXCISION VULVA;  Surgeon: Imagene Gurney A. Alycia Rossetti, MD;  Location: [facility];  Service: Gynecology;  Laterality: N/A;  . Tonsillectomy  1968  . Lumbar spine surgery  1998    L5 -- S1  . Dilation and curettage of uterus  1968  . Breast lumpectomy Left 03-27-2004  . Mastectomy Right 1997    W/  LYMPH NODE DISSECTION AND RECONSTRUCTION WITH IMPLANTS AND EXPANDER  . Thyroidectomy, partial  1983  . Tendon repair right ring finger  2011  . Laparoscopic cholecystectomy  06-21-2010  . Knee arthroscopy Bilateral left 01-1984 & 04-1992/   right [phone]  . Vulvectomy partial  07-16-2010  . Cardiac catheterization  10-30-2000  dr Wynonia Lawman    normal coronary arteries and LVF/  ef 65-70%  . Removal right breast implant and capsulectomy  06-03-1999  . Thumb trigger release Right 1993  . Total knee arthroplasty Bilateral right 05-04-2000/  left 03-29-2001  . Closed manipulation  w/ open reduction exchange tibial femoral bearing post total left knee  04-14-2001  . Revision total knee arthroplasty Left 07-07-2005  &  12-10-2001    12-10-2001  REMOVAL TOTAL KNEE/ I&D / INSERTION ANTIBIOTIC SPACER  . Revision total knee  arthroplasty Right 07-07-2005  . Port-a-cath placement and removal  1997  . Vulvectomy Left 03/29/2014    Procedure: WIDE LOCAL EXCISION OF LEFT VULVA ;  Surgeon: Imagene Gurney A. Alycia Rossetti, MD;  Location: [facility];  Service: Gynecology;  Laterality: Left;  . Vulvectomy Bilateral 01/24/2015    Procedure: WIDE LOCAL EXCISION VULVA;  Surgeon: [name], MD;  Location: [facility];  Service: Gynecology;  Laterality: Bilateral;    FAMILY HISTORY Family History  Problem Relation Age of Onset  . Cancer Father 30    lung and kidney   . Alzheimer's disease Mother   . Alcohol abuse Brother   . Heart attack Maternal Grandmother   The patient's father  had a history of lung cancer metastatic to the kidneys. He died at age 23. The patient's mother had Alzheimer's disease and died at age 25. The patient had 3 brothers, 2 sisters. One brother has been diagnosed with prostate cancer at age 71. There is also: Lung and brain cancers in cousins.  GYNECOLOGIC HISTORY:  No LMP recorded. Patient is postmenopausal. Menarche age 79, first live birth age 24. Patient is GX P1. She went through menopause February 1997 when she received chemotherapy. She has birth control remotely without complications.  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 Her husband Ronalee Belts worked in Engineer, materials, but lost his job 7 years ago and could not find other employment. Her daughter was stillborn in 77. At home is just the patient and Ronalee Belts, with no pets     ADVANCED DIRECTIVES: Not in place   HEALTH MAINTENANCE: Social History  Substance Use Topics  . Smoking status: Former Smoker -- 2.00 packs/day for 41 years    Types: Cigarettes    Quit date: 06/05/1999  . Smokeless tobacco: Never Used  . Alcohol Use: No     Colonoscopy: 2010  PAP:  Bone density: 2017 ([facility]).  Lipid panel:  Allergies  Allergen Reactions  . Codeine Other (See Comments)    Hyper/ Hallucinations   . Demerol  [Meperidine] Other (See Comments)    "Knocks me out"  . Diazepam Other (See Comments)    Extreme sedation; patient does not recall taking diazepam (12/07/14)  . Tape Hives and Other (See Comments)    "thick clear plastic tape" causes blisters  . Morphine And Related Itching and Rash    Per CRNA, pt tol Fentanyl IV for OR case, no issues reported to rec RN in PACU, no immediate PACU issues noted as well upon arrival  . Penicillins Hives and Swelling  . Valisone [Betamethasone] Rash    Current Outpatient Prescriptions  Medication Sig Dispense Refill  . albuterol (PROVENTIL HFA;VENTOLIN HFA) 108 (90 BASE) MCG/ACT inhaler Inhale 2 puffs into the lungs every 4 (four) hours as needed for wheezing or shortness of breath. Reported on 08/01/2015    . albuterol (PROVENTIL) (2.5 MG/3ML) 0.083% nebulizer solution Take 2.5 mg by nebulization every 6 (six) hours as needed for wheezing or shortness of breath. Reported on 08/01/2015    . beta carotene w/minerals (OCUVITE) tablet Take 2 tablets by mouth daily.     Marland Kitchen buPROPion (WELLBUTRIN XL) 150 MG 24 hr tablet Take 150 mg by mouth every morning.     . Calcium Carbonate-Vitamin D 600-125 MG-UNIT TABS Take 1 tablet by mouth daily.     . Cholecalciferol (VITAMIN D3) 1000 UNITS CAPS Take 2 capsules by mouth daily.    . citalopram (CELEXA) 40 MG tablet Take 40 mg by mouth every morning.     . clotrimazole-betamethasone (LOTRISONE) cream Apply topically 2 (two) times daily. 45 g 1  . docusate sodium (COLACE) 100 MG capsule Take 300 mg by mouth daily.     Marland Kitchen levothyroxine (SYNTHROID, LEVOTHROID) 175 MCG tablet TAKE 1 TABLET (175 MCG TOTAL) BY MOUTH DAILY.--  TAKING NAME BRAND SYNTHROID--  TAKES IN AM  2  . montelukast (SINGULAIR) 10 MG tablet     . Multiple Vitamins-Minerals (MULTIVITAMIN GUMMIES ADULT) CHEW Chew 2 tablets by mouth daily.    . Omega-3 1000 MG CAPS Take 1 g by mouth daily.     . pantoprazole (PROTONIX) 40 MG tablet Take 40 mg by mouth every morning.      Marland Kitchen  polyvinyl alcohol (LIQUIFILM TEARS) 1.4 % ophthalmic solution Place 1 drop into both eyes as needed for dry eyes.    Marland Kitchen spironolactone (ALDACTONE) 25 MG tablet Take 0.5-1 tablets (12.5-25 mg total) by mouth as needed (for swelling). 30 tablet 0   No current facility-administered medications for this visit.    OBJECTIVE: Middle-aged white woman who appears older than stated age, using a walker/chair Filed Vitals:   10/23/15 1556  BP: 153/83  Pulse: 80  Temp: 98.7 F (37.1 C)  Resp: 18     Body mass index is 37.01 kg/(m^2).    ECOG FS:2 - Symptomatic, <50% confined to bed  Ocular: Sclerae unicteric, pupils equal, round and reactive to light Ear-nose-throat: Oropharynx clear and moist Lymphatic: No cervical or supraclavicular adenopathy Lungs no rales or rhonchi, good excursion bilaterally, coughs during exam on deep inspiration Heart regular rate and rhythm, no murmur appreciated Abd soft, obese, nontender, positive bowel sounds MSK no focal spinal tenderness, no upper extremity lymphedema; there is tenderness in the chest wall immediately associated with her surgical scar. Palpation of the right chest wall otherwise does not elicit tenderness Neuro: non-focal, well-oriented, appropriate affect Breasts: The right breast is status post mastectomy. There is no evidence of local recurrence. The right axilla is benign. The left breast is unremarkable   LAB RESULTS:  CMP     Component Value Date/Time   NA 143 10/23/2015 1542   NA 139 07/28/2013 1100   K 4.7 10/23/2015 1542   K 4.2 07/28/2013 1100   CL 100 07/28/2013 1100   CO2 28 10/23/2015 1542   CO2 28 07/28/2013 1100   GLUCOSE 97 10/23/2015 1542   GLUCOSE 136* 07/28/2013 1100   BUN 12.2 10/23/2015 1542   BUN 10 07/28/2013 1100   CREATININE 0.7 10/23/2015 1542   CREATININE 0.57 07/28/2013 1100   CALCIUM 9.7 10/23/2015 1542   CALCIUM 9.5 07/28/2013 1100   PROT 6.6 10/23/2015 1542   PROT 6.7 09/08/2011 1524   ALBUMIN 3.9  10/23/2015 1542   ALBUMIN 3.9 09/08/2011 1524   AST 17 10/23/2015 1542   AST 23 09/08/2011 1524   ALT 14 10/23/2015 1542   ALT 18 09/08/2011 1524   ALKPHOS 85 10/23/2015 1542   ALKPHOS 84 09/08/2011 1524   BILITOT 0.32 10/23/2015 1542   BILITOT 0.3 09/08/2011 1524   GFRNONAA >90 07/28/2013 1100   GFRAA >90 07/28/2013 1100    INo results found for: SPEP, UPEP  Lab Results  Component Value Date   WBC 5.4 10/23/2015   NEUTROABS 3.6 10/23/2015   HGB 13.4 10/23/2015   HCT 40.8 10/23/2015   MCV 87.3 10/23/2015   PLT 138* 10/23/2015      Chemistry      Component Value Date/Time   NA 143 10/23/2015 1542   NA 139 07/28/2013 1100   K 4.7 10/23/2015 1542   K 4.2 07/28/2013 1100   CL 100 07/28/2013 1100   CO2 28 10/23/2015 1542   CO2 28 07/28/2013 1100   BUN 12.2 10/23/2015 1542   BUN 10 07/28/2013 1100   CREATININE 0.7 10/23/2015 1542   CREATININE 0.57 07/28/2013 1100      Component Value Date/Time   CALCIUM 9.7 10/23/2015 1542   CALCIUM 9.5 07/28/2013 1100   ALKPHOS 85 10/23/2015 1542   ALKPHOS 84 09/08/2011 1524   AST 17 10/23/2015 1542   AST 23 09/08/2011 1524   ALT 14 10/23/2015 1542   ALT 18 09/08/2011 1524   BILITOT 0.32 10/23/2015 1542  BILITOT 0.3 09/08/2011 1524       No results found for: LABCA2  No components found for: [id number]  No results for input(s): INR in the last 168 hours.  Urinalysis    Component Value Date/Time   COLORURINE YELLOW 05/05/2011 1225   APPEARANCEUR CLEAR 05/05/2011 1225   LABSPEC 1.010 06/15/2013 1534   LABSPEC 1.006 05/05/2011 1225   PHURINE 6.5 06/15/2013 1534   PHURINE 7.0 05/05/2011 1225   GLUCOSEU Negative 06/15/2013 1534   GLUCOSEU NEGATIVE 05/05/2011 1225   HGBUR Negative 06/15/2013 1534   HGBUR NEGATIVE 05/05/2011 1225   BILIRUBINUR Negative 06/15/2013 1534   BILIRUBINUR neg 03/17/2012 1119   BILIRUBINUR NEGATIVE 05/05/2011 1225   KETONESUR 5 06/15/2013 1534   KETONESUR NEGATIVE 05/05/2011 1225   PROTEINUR  Negative 06/15/2013 1534   PROTEINUR neg 03/17/2012 1119   PROTEINUR NEGATIVE 05/05/2011 1225   UROBILINOGEN 0.2 06/15/2013 1534   UROBILINOGEN negative 03/17/2012 1119   UROBILINOGEN 0.2 05/05/2011 1225   NITRITE Negative 06/15/2013 1534   NITRITE neg 03/17/2012 1119   NITRITE NEGATIVE 05/05/2011 1225   LEUKOCYTESUR Small 06/15/2013 1534   LEUKOCYTESUR Negative 03/17/2012 1119      ELIGIBLE FOR AVAILABLE RESEARCH PROTOCOL: no  STUDIES: Dg Chest 2 View  10/04/2015  CLINICAL DATA:  Chest wall pain after surgery, history of breast carcinoma with right mastectomy EXAM: CHEST  2 VIEW COMPARISON:  Chest x-ray of 10/26/2012 FINDINGS: No active infiltrate or effusion is seen. Mediastinal and hilar contours are unremarkable. The heart is mildly enlarged. There are degenerative changes in the lower thoracic spine and in the shoulders. Changes of right mastectomy are noted. IMPRESSION: No active cardiopulmonary disease. Electronically Signed   By: Ivar Drape M.D.   On: 10/04/2015 11:13    ASSESSMENT: 70 y.o. [place] woman  (1) status post right mastectomy and axillary lymph node dissection in 1997 for a stage II, estrogen receptor positive invasive breast cancer, treated adjuvantly with chemotherapy (agents not retrievable), followed by tamoxifen for 6 years  (2) status post left lumpectomy 10/04/2015 for ductal carcinoma in situ, grade 2, with negative margins; treated adjuvantly with letrozole for 6 years  Restasis 3) greater than 40-pack-year smoking history, the patient quitting in 2001  PLAN: It is not impossible that what we are dealing with in several case is recurrent breast cancer. However I think it is very unlikely. She has had multiple surgeries in that area (she tells me her implant had to be replaced 3 times), has had multiple falls recently, and has a history of possible fibromyalgia. I think it is much more likely that we are dealing with a  musculoskeletal/arthritic problem.  However she does have a greater than 40-pack-year smoking history and a significant family history for lung cancer.  I think the best way to evaluate Kaele is to obtain a CT scan of the chest. This will let us know whether there is any cancer in the lungs, we will give [place] a good view of the bones including ribs and spine in the area in question and if negative will very largely rule out recurrent breast cancer as an explanation for her pain.  I suggested that she might benefit from referral to a pain clinic. She is currently receiving physical therapy under the direction of Dr. Excell Seltzer and she says that is already improving things some. The time I see her again she should be done with the physical therapy and we should be able to make a decision whether  referral to a pain clinic would be appropriate.  Otherwise she knows to call for any problems that may develop before her next visit with me, which will be in approximately 5 weeks  Chauncey Cruel, MD   10/23/2015 5:26 PM Medical Oncology and Hematology [facility], [place], [place] [address] Tel. [phone]    Fax. [phone]

## 2015-10-26 ENCOUNTER — Ambulatory Visit: Payer: Medicare Other | Attending: Gynecology | Admitting: Gynecology

## 2015-10-26 ENCOUNTER — Encounter: Payer: Self-pay | Admitting: Gynecology

## 2015-10-26 VITALS — BP 131/83 | HR 80 | Temp 98.3°F | Resp 18 | Ht 65.0 in | Wt 221.8 lb

## 2015-10-26 DIAGNOSIS — M797 Fibromyalgia: Secondary | ICD-10-CM | POA: Insufficient documentation

## 2015-10-26 DIAGNOSIS — H3530 Unspecified macular degeneration: Secondary | ICD-10-CM | POA: Diagnosis not present

## 2015-10-26 DIAGNOSIS — Z853 Personal history of malignant neoplasm of breast: Secondary | ICD-10-CM | POA: Diagnosis not present

## 2015-10-26 DIAGNOSIS — E65 Localized adiposity: Secondary | ICD-10-CM | POA: Diagnosis not present

## 2015-10-26 DIAGNOSIS — K5909 Other constipation: Secondary | ICD-10-CM | POA: Insufficient documentation

## 2015-10-26 DIAGNOSIS — J45909 Unspecified asthma, uncomplicated: Secondary | ICD-10-CM | POA: Insufficient documentation

## 2015-10-26 DIAGNOSIS — R32 Unspecified urinary incontinence: Secondary | ICD-10-CM | POA: Insufficient documentation

## 2015-10-26 DIAGNOSIS — C4499 Other specified malignant neoplasm of skin, unspecified: Secondary | ICD-10-CM

## 2015-10-26 DIAGNOSIS — Z87891 Personal history of nicotine dependence: Secondary | ICD-10-CM | POA: Diagnosis not present

## 2015-10-26 DIAGNOSIS — E039 Hypothyroidism, unspecified: Secondary | ICD-10-CM | POA: Insufficient documentation

## 2015-10-26 DIAGNOSIS — D259 Leiomyoma of uterus, unspecified: Secondary | ICD-10-CM | POA: Insufficient documentation

## 2015-10-26 DIAGNOSIS — N762 Acute vulvitis: Secondary | ICD-10-CM

## 2015-10-26 DIAGNOSIS — F329 Major depressive disorder, single episode, unspecified: Secondary | ICD-10-CM | POA: Insufficient documentation

## 2015-10-26 DIAGNOSIS — Z8711 Personal history of peptic ulcer disease: Secondary | ICD-10-CM | POA: Insufficient documentation

## 2015-10-26 DIAGNOSIS — Z86010 Personal history of colonic polyps: Secondary | ICD-10-CM | POA: Insufficient documentation

## 2015-10-26 DIAGNOSIS — C519 Malignant neoplasm of vulva, unspecified: Secondary | ICD-10-CM | POA: Insufficient documentation

## 2015-10-26 DIAGNOSIS — F419 Anxiety disorder, unspecified: Secondary | ICD-10-CM | POA: Insufficient documentation

## 2015-10-26 NOTE — Progress Notes (Signed)
Consult Note: Gyn-Onc  [name] 70 y.o. female  CC:  Chief Complaint  Patient presents with  . 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 Some of the surgical margins were positive. Her postoperative course was uncomplicated. She was last seen by our service in 12/14. At that time there was no evidence of any lesions consistent with Paget's. However, there is considerable amount of excoriations throughout the entire vulvar and medial thighs consistent with chronic vulvitis.In December 2013 she had a hernia repair due to bowel obstruction by Dr. Excell Seltzer with permanent mesh. In May 2013 she also had a TVT bladder by Dr. Everett Graff. She does continue to wear a pad and also takes Detrol twice daily but states that her urinary leakage is markedly improved. She described the vulvar irritation is intermittent itching with burning and that it has increased over time as has the pruritus. She does use a steroid cream twice daily and does scratch her vulva.   She underwent a wide local excision of the left vulva on January 2 of year 2014. Operative findings included 2 separate 3 x 3 and 1.2 cm lesions consistent with Paget's on the left.  Pathology revealed 2 foci of extramammary Paget's disease extending to the left inked margin in the right and anterior tip margins at multiple foci. The posterior margin was negative for malignancy.   I performed a biopsy of this it was consistent with recurrent Paget's disease. On 11/23/2012 showed a repeat wide local excision of liver x2 with the posterior fourchette biopsy. Findings revealed along the left labia minora/vagina is a Paget's disease measuring approximately 2.5 x 1 cm in the superior aspect of her prior left vulvar excision. The inferior aspect of a similar lesion. 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 On the left  perineal body the area is somewhat excoriated pale. Biopsy was performed in this area.  Pathology revealed:  1. Vulva, biopsy, left inferior - EXTRAMAMMARY PAGET DISEASE EXTENDING TO THE EDGES OF THE BIOPSY. 2. Vulva, excision, left superior lesion suture marks 1200 - EXTRAMAMMARY PAGET DISEASE. - MARGINS NOT INVOLVED. - CLOSEST MARGIN 9 O'CLOCK, LESS THAN 0.1 CM. 3. Vulva, excision, left inferior excision suture marks 1200 - EXTRAMAMMARY PAGET DISEASE, 12 O'CLOCK MARGIN FOCALLY INVOLVED. - REMAINING MARGINS CLEAR.  We have discussed repeating surgery however she had multiple orthopedic issues had broken ankle. She was last seen by [name] in December 2014. Exam at that time was fairly unremarkable.  I had seen her in January of 2015. At that time exam was concerning for Paget's.   On 08/02/2013 she underwent wide local excisions of the vulva x2.  Operative findings included pale pink raised lesion measuring 1 x 0.5 cm the left upper vulva near the prior excision site. There is a pale raised lesion measuring 1.5 x 1 cm on the left crossing midline perineal body.  Pathology revealed left superior vulvar excision with extramammary Paget's disease extending to the 9:00 margin. The other margins were negative for tumor. Of the left of midline lesion she extramammary Paget's disease extending to the 12:00 margin and focally at the 6:00 margin but otherwise negative margins. The patient at that time discussed with [place] that she was having some right lower quadrant pain. Her exam is very difficult secondary to habitus. There ultrasound was obtained. Ultrasound revealed:  FINDINGS:  Uterus Measurements: 4.6 x 3.5 x 3.8 cm. 2.5 x 1.7 x 1.6 cm uterine fibroid. This is present in the right lateral aspect of the body uterus.  Endometrium Thickness: 6.1 mm. No focal abnormality visualized.  Right ovary: No focal abnormality.  Left ovary :No focal abnormality.  Other findings: No free fluid   IMPRESSION:  Uterine fibroid otherwise negative exam.  I saw her March 16, 2014 at which time there was an area on the left vulva with hyperkeratosis. A biopsy of that area was performed consistent with Paget's disease. On March 29, 2014 she underwent wide local excision of the vulva. On exam she had a 1 cm left vulvar lesion just below the left labia minora. She underwent a wide local excision. He received a 3.7 x 1.7 x 0.3 cm lesion. There was extramammary Paget's disease extending to the 6 to 9:00 margin in the 12:00 margin. This is despite grossly negative margins on physical examination.   She had an excision in November that revealed Diagnosis Vulva, excision, w/stitch @ 1200 - EXTRAMAMMARY PAGET'S DISEASE, EXTENDING TO THE 6-9 O'CLOCK MARGIN AND 12 O'CLOCK PERPENDICULAR MARGIN.  I saw her in February 2016 at which time there was concern for recurrent disease. She decided to proceed with Aldara therapy. She used for approximate 4 weeks and then decided to stop it secondary to expensive medication. She then started using the clobetasol. While she was using the Aldara all the pruritus in her vulva resolved itself. Since she stopped it and was using the steroid cream the pruritus returned.  I saw her in May 2016 at which time she has small area consistent with Paget's on the right labia minora and a larger patch on the left. Biopsy on the right revealed extramammary Paget's. After discussion she wished to try the Aldara again. She called and requested an earlier appointment as for the last 2-3 weeks had increased itching and has been scratching so much that she's had some bleeding. I last saw her on July 6. At that time she had been having a lot of symptoms of pruritus and scratching a great deal. Vulvar examination at that time revealed a fairly macerated vulva and it was possible to discern what could be related to Paget's, effect of the Aldara therapy, trauma from scratching. She was asked  to stop the Aldara therapy and use a low-dose daily cream. We asked her to refrain from scratching come in today for evaluation.  I saw her again December 26, 2104 at which time there was evidence clearly a Paget's disease and she scheduled for surgery. On January 24, 2015 she underwent bilateral wide local excisions of the vulva. On the right vulva there was a 2 cm patch of Paget's disease in the mid labia majora. On the left side. The remaining labia minora in the area prior to the excision are smaller lesion consistent with Paget's disease.  Diagnosis 1. Vulva, excision, left wide excision - EXTRAMAMMARY PAGET'S DISEASE EXTENDING TO THE 12 O'CLOCK, 3 O'CLOCK, 6 O'CLOCK AND 9 O'CLOCK MARGINS. 2. Vulva, excision, right wide excision - EXTRAMAMMARY PAGET'S EXTENDING TO THE 3 O'CLOCK MARGIN AND THE 6 O'CLOCK POLE.  She comes in today for follow-up. I last saw her in December 2016. She does have some itching but much less than she has in the past. She was is her clobetasol cream when necessary. There been no other major issues for her healthwise.  Current Meds:  Outpatient Encounter Prescriptions as of 10/26/2015  Medication Sig  .  albuterol (PROVENTIL HFA;VENTOLIN HFA) 108 (90 BASE) MCG/ACT inhaler Inhale 2 puffs into the lungs every 4 (four) hours as needed for wheezing or shortness of breath. Reported on 08/01/2015  . albuterol (PROVENTIL) (2.5 MG/3ML) 0.083% nebulizer solution Take 2.5 mg by nebulization every 6 (six) hours as needed for wheezing or shortness of breath. Reported on 08/01/2015  . beta carotene w/minerals (OCUVITE) tablet Take 2 tablets by mouth daily.   Marland Kitchen buPROPion (WELLBUTRIN XL) 150 MG 24 hr tablet Take 150 mg by mouth every morning.   . Calcium Carbonate-Vitamin D 600-125 MG-UNIT TABS Take 1 tablet by mouth daily.   . Cholecalciferol (VITAMIN D3) 1000 UNITS CAPS Take 2 capsules by mouth daily.  . citalopram (CELEXA) 40 MG tablet Take 40 mg by mouth every morning.   .  clotrimazole-betamethasone (LOTRISONE) cream Apply topically 2 (two) times daily.  Marland Kitchen docusate sodium (COLACE) 100 MG capsule Take 300 mg by mouth daily.   Marland Kitchen levothyroxine (SYNTHROID, LEVOTHROID) 175 MCG tablet TAKE 1 TABLET (175 MCG TOTAL) BY MOUTH DAILY.--  TAKING NAME BRAND SYNTHROID--  TAKES IN AM  . montelukast (SINGULAIR) 10 MG tablet   . Multiple Vitamins-Minerals (MULTIVITAMIN GUMMIES ADULT) CHEW Chew 2 tablets by mouth daily.  . Omega-3 1000 MG CAPS Take 1 g by mouth daily.   . pantoprazole (PROTONIX) 40 MG tablet Take 40 mg by mouth every morning.   . polyvinyl alcohol (LIQUIFILM TEARS) 1.4 % ophthalmic solution Place 1 drop into both eyes as needed for dry eyes.  Marland Kitchen spironolactone (ALDACTONE) 25 MG tablet Take 0.5-1 tablets (12.5-25 mg total) by mouth as needed (for swelling).   No facility-administered encounter medications on file as of 10/26/2015.    Allergy:  Allergies  Allergen Reactions  . Codeine Other (See Comments)    Hyper/ Hallucinations   . Demerol [Meperidine] Other (See Comments)    "Knocks me out"  . Diazepam Other (See Comments)    Extreme sedation; patient does not recall taking diazepam (12/07/14)  . Tape Hives and Other (See Comments)    "thick clear plastic tape" causes blisters  . Morphine And Related Itching and Rash    Per CRNA, pt tol Fentanyl IV for OR case, no issues reported to rec RN in PACU, no immediate PACU issues noted as well upon arrival  . Penicillins Hives and Swelling  . Valisone [Betamethasone] Rash    Social Hx:   Social History   Social History  . Marital Status: Married    Spouse Name: N/A  . Number of Children: N/A  . Years of Education: N/A   Occupational History  . Not on file.   Social History Main Topics  . Smoking status: Former Smoker -- 2.00 packs/day for 41 years    Types: Cigarettes    Quit date: 06/05/1999  . Smokeless tobacco: Never Used  . Alcohol Use: No  . Drug Use: No  . Sexual Activity: Not on file    Other Topics Concern  . Not on file   Social History Narrative    Past Surgical Hx:  Past Surgical History  Procedure Laterality Date  . Foot surgery Left 02-01-2009    TRIPLE ARTHRODESIS  . Rotator cuff repair Right   . Incisional hernia repair  05/05/2011    Procedure: LAPAROSCOPIC INCISIONAL HERNIA;  Surgeon: Edward Jolly, MD;  Location: [facility];  Service: General;  Laterality: N/A;  LAPAROSCOPIC REPAIR INCARCERATED INCISIONAL HERNIA  WITH MESH  . Bladder suspension  10/13/2011    Procedure:  TRANSVAGINAL TAPE (TVT) PROCEDURE;  Surgeon: Delice Lesch, MD;  Location: [facility];  Service: Gynecology;  Laterality: N/A;  . Cystoscopy  10/13/2011    Procedure: CYSTOSCOPY;  Surgeon: Delice Lesch, MD;  Location: [facility];  Service: Gynecology;  Laterality: Bilateral;  . Vulvectomy  05/27/2012    Procedure: WIDE EXCISION VULVECTOMY;  Surgeon: Imagene Gurney A. Alycia Rossetti, MD;  Location: [facility];  Service: Gynecology;  Laterality: N/A;  Wide Local Excision of Vulva   . Vulvectomy N/A 11/23/2012    Procedure: WIDE LOCAL EXCISION OF THE VULVA;  Surgeon: Imagene Gurney A. Alycia Rossetti, MD;  Location: [facility];  Service: Gynecology;  Laterality: N/A;  left inferior vulvar biopsy excision left superior lesion left inferior vulvar excision  . Foot surgery Left 11/14    I&D left foot with woundvac placement  . Vulvectomy N/A 08/02/2013    Procedure: WIDE LOCAL  EXCISION VULVA;  Surgeon: Imagene Gurney A. Alycia Rossetti, MD;  Location: [facility];  Service: Gynecology;  Laterality: N/A;  . Tonsillectomy  1968  . Lumbar spine surgery  1998    L5 -- S1  . Dilation and curettage of uterus  1968  . Breast lumpectomy Left 03-27-2004  . Mastectomy Right 1997    W/  LYMPH NODE DISSECTION AND RECONSTRUCTION WITH IMPLANTS AND EXPANDER  . Thyroidectomy, partial  1983  . Tendon repair right ring finger  2011  . Laparoscopic cholecystectomy  06-21-2010  . Knee arthroscopy Bilateral left 01-1984 & 04-1992/   right [phone]  . Vulvectomy partial   07-16-2010  . Cardiac catheterization  10-30-2000  dr Wynonia Lawman    normal coronary arteries and LVF/  ef 65-70%  . Removal right breast implant and capsulectomy  06-03-1999  . Thumb trigger release Right 1993  . Total knee arthroplasty Bilateral right 05-04-2000/  left 03-29-2001  . Closed manipulation  w/ open reduction exchange tibial femoral bearing post total left knee  04-14-2001  . Revision total knee arthroplasty Left 07-07-2005  &  12-10-2001    12-10-2001  REMOVAL TOTAL KNEE/ I&D / INSERTION ANTIBIOTIC SPACER  . Revision total knee arthroplasty Right 07-07-2005  . Port-a-cath placement and removal  1997  . Vulvectomy Left 03/29/2014    Procedure: WIDE LOCAL EXCISION OF LEFT VULVA ;  Surgeon: Imagene Gurney A. Alycia Rossetti, MD;  Location: [facility];  Service: Gynecology;  Laterality: Left;  . Vulvectomy Bilateral 01/24/2015    Procedure: WIDE LOCAL EXCISION VULVA;  Surgeon: [name], MD;  Location: [facility];  Service: Gynecology;  Laterality: Bilateral;    Past Medical Hx:  Past Medical History  Diagnosis Date  . Macular degeneration of right eye   . Seasonal allergies   . GERD (gastroesophageal reflux disease)   . History of breast cancer no recurrence    1997  right breast cancer  s/p  mastectomy w/ snl dissection and chemoradiation/  2005  left breast cancer DCIS  s/p  lumpectomy and radiation  . Depression   . Hypothyroidism   . Asthma   . Fibromyalgia   . Arthritis   . Wears glasses   . Anxiety   . Numbness of left foot     4 toes  . History of colon polyps     2007 hyperplagia  . History of esophageal dilatation     2008  . History of small bowel obstruction     2012  . History of peptic ulcer   . Paget's disease of vulva   . Chronic constipation   .  SUI (stress urinary incontinence, female)   . At risk for sleep apnea     STOP-BANG= 4     SENT TO PCP 03-27-2014  . History of acute bronchitis     BEGINNING OF AUG 2016--  NO SYMPTOM FREE   . PONV (postoperative nausea and vomiting)     and HARD TO WAKE    Family Hx:  Family History  Problem Relation Age of Onset  . Cancer Father 42    lung and kidney   . Alzheimer's disease Mother   . Alcohol abuse Brother   . Heart attack Maternal Grandmother     Vitals:  Blood pressure 131/83, pulse 80, temperature 98.3 F (36.8 C), temperature source Oral, resp. rate 18, height 5\' 5"  (1.651 m), weight 221 lb 12.8 oz (100.608 kg), SpO2 98 %.  Physical Exam: Well-nourished well-developed female in no acute distress.  Abdomen: Obese, soft slightly tympanitic. Not distended. Exam is limited by habitus. Beneath her pannus, especially on the right side, is a considerable yeast inflamed skin.  Pelvic: Well-healed surgical incisions.The right vulva is fairly unremarkable. The left labia was notable for multiple surgical excisions and a considerable amount of excoriation measuring approximately 4 x 3 cm. It is unclear to me whether this is Paget's disease or just chronic irritation as the patient scratches continually.. There is a small erythematous patch on the right labia that smaller than a centimeter. It is slightly velvety in appearance but not particularly concerning at this point. This seems to be unchanged from her prior exam. There are no significant lesions for biopsy today.   Assessment/Plan: 70 year old with recurrent vulvar Paget's disease. She does not have any areas that we need to biopsy today. While I'm concerned that this could represent a small area Paget she is very comfortable for short term follow-up will return to see me in 3 months. In the interim, I would recommend that she use Desitin 3 times a day as a barrier. (The patient has urinary incontinence and wears a pad and between that abrasion and the patient's own scratching I believe a good amount of her vulvar symptoms and findings are associated with this vulvitis.) In addition she'll use an Aveeno oatmeal bath (sitz  bath) every night. She will use nystatin powder under her panniculus twice a day.  I did discuss with the patient that given the multiple recurrences she's had might be reasonable at some point to consider performing a "skinning vulvectomy" with split thickness skin graft. I explained that this require a six-day hospital stay and would best be done at [facility]. While we have not committed to that at this point in time I believe in some point it will be worth considering.  CLARKE-PEARSON,Carmello Cabiness L, MD 10/26/2015, 11:45 AM

## 2015-10-26 NOTE — Patient Instructions (Signed)
Plan to apply desitin to the vulva two to three times daily.  Also, try an Aveeno Oatmeal powder (place one tablespoon in the water) sitz bath in the evenings then apply the desitin to the dry skin after.  You can also apply the anti-fungal powder to the yeast in the skin folds.  Plan to follow up in three months or sooner if needed.

## 2015-11-06 ENCOUNTER — Encounter (HOSPITAL_COMMUNITY): Payer: Self-pay

## 2015-11-06 ENCOUNTER — Ambulatory Visit (HOSPITAL_COMMUNITY): Payer: Medicare Other

## 2015-11-06 ENCOUNTER — Ambulatory Visit (HOSPITAL_COMMUNITY)
Admission: RE | Admit: 2015-11-06 | Discharge: 2015-11-06 | Disposition: A | Discharge status: Home / Self Care | Payer: Medicare Other | Source: Ambulatory Visit | Attending: Oncology | Admitting: Oncology

## 2015-11-06 DIAGNOSIS — C50911 Malignant neoplasm of unspecified site of right female breast: Secondary | ICD-10-CM

## 2015-11-06 DIAGNOSIS — Z87891 Personal history of nicotine dependence: Secondary | ICD-10-CM

## 2015-11-06 DIAGNOSIS — C4499 Other specified malignant neoplasm of skin, unspecified: Secondary | ICD-10-CM | POA: Insufficient documentation

## 2015-11-06 MED ORDER — IOPAMIDOL (ISOVUE-300) INJECTION 61%
75.0000 mL | Freq: Once | INTRAVENOUS | Status: AC | PRN
Start: 2015-11-06 — End: 2015-11-06
  Administered 2015-11-06: 75 mL via INTRAVENOUS

## 2015-11-08 ENCOUNTER — Telehealth: Payer: Self-pay | Admitting: *Deleted

## 2015-11-08 NOTE — Telephone Encounter (Signed)
This RN obtained results and informed pt. 

## 2015-11-08 NOTE — Telephone Encounter (Signed)
Patient called wanting CT results. 

## 2015-11-09 ENCOUNTER — Other Ambulatory Visit: Payer: Self-pay | Admitting: Oncology

## 2015-12-11 DIAGNOSIS — M79672 Pain in left foot: Secondary | ICD-10-CM | POA: Insufficient documentation

## 2015-12-11 HISTORY — DX: Pain in left foot: M79.672

## 2015-12-26 HISTORY — PX: OTHER SURGICAL HISTORY: SHX169

## 2015-12-31 ENCOUNTER — Institutional Professional Consult (permissible substitution): Payer: Medicare Other | Admitting: Neurology

## 2016-01-23 ENCOUNTER — Encounter: Payer: Self-pay | Admitting: Gynecologic Oncology

## 2016-01-23 ENCOUNTER — Ambulatory Visit: Payer: Medicare Other | Attending: Gynecologic Oncology | Admitting: Gynecologic Oncology

## 2016-01-23 VITALS — BP 153/76 | HR 74 | Temp 98.1°F | Resp 18 | Ht 65.0 in | Wt 232.4 lb

## 2016-01-23 DIAGNOSIS — F419 Anxiety disorder, unspecified: Secondary | ICD-10-CM | POA: Diagnosis not present

## 2016-01-23 DIAGNOSIS — J45909 Unspecified asthma, uncomplicated: Secondary | ICD-10-CM | POA: Diagnosis not present

## 2016-01-23 DIAGNOSIS — K5909 Other constipation: Secondary | ICD-10-CM | POA: Insufficient documentation

## 2016-01-23 DIAGNOSIS — F329 Major depressive disorder, single episode, unspecified: Secondary | ICD-10-CM | POA: Insufficient documentation

## 2016-01-23 DIAGNOSIS — Z79899 Other long term (current) drug therapy: Secondary | ICD-10-CM | POA: Diagnosis not present

## 2016-01-23 DIAGNOSIS — H3530 Unspecified macular degeneration: Secondary | ICD-10-CM | POA: Insufficient documentation

## 2016-01-23 DIAGNOSIS — R32 Unspecified urinary incontinence: Secondary | ICD-10-CM | POA: Diagnosis not present

## 2016-01-23 DIAGNOSIS — Z9889 Other specified postprocedural states: Secondary | ICD-10-CM | POA: Insufficient documentation

## 2016-01-23 DIAGNOSIS — C519 Malignant neoplasm of vulva, unspecified: Secondary | ICD-10-CM | POA: Diagnosis present

## 2016-01-23 DIAGNOSIS — R1031 Right lower quadrant pain: Secondary | ICD-10-CM | POA: Insufficient documentation

## 2016-01-23 DIAGNOSIS — Z853 Personal history of malignant neoplasm of breast: Secondary | ICD-10-CM | POA: Diagnosis not present

## 2016-01-23 DIAGNOSIS — C4499 Other specified malignant neoplasm of skin, unspecified: Secondary | ICD-10-CM

## 2016-01-23 DIAGNOSIS — E039 Hypothyroidism, unspecified: Secondary | ICD-10-CM | POA: Diagnosis not present

## 2016-01-23 DIAGNOSIS — K219 Gastro-esophageal reflux disease without esophagitis: Secondary | ICD-10-CM | POA: Diagnosis not present

## 2016-01-23 DIAGNOSIS — Z86010 Personal history of colonic polyps: Secondary | ICD-10-CM | POA: Insufficient documentation

## 2016-01-23 DIAGNOSIS — Z96653 Presence of artificial knee joint, bilateral: Secondary | ICD-10-CM | POA: Diagnosis not present

## 2016-01-23 DIAGNOSIS — Z888 Allergy status to other drugs, medicaments and biological substances status: Secondary | ICD-10-CM | POA: Insufficient documentation

## 2016-01-23 DIAGNOSIS — M1990 Unspecified osteoarthritis, unspecified site: Secondary | ICD-10-CM | POA: Diagnosis not present

## 2016-01-23 DIAGNOSIS — Z880 Allergy status to penicillin: Secondary | ICD-10-CM | POA: Diagnosis not present

## 2016-01-23 DIAGNOSIS — Z87891 Personal history of nicotine dependence: Secondary | ICD-10-CM | POA: Insufficient documentation

## 2016-01-23 DIAGNOSIS — M797 Fibromyalgia: Secondary | ICD-10-CM | POA: Insufficient documentation

## 2016-01-23 NOTE — Progress Notes (Signed)
Consult Note: Gyn-Onc  [name] 70 y.o. female  CC:  Chief Complaint  Patient presents with  . 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 Some of the surgical margins were positive. Her postoperative course was uncomplicated. She was last seen by our service in 12/14. At that time there was no evidence of any lesions consistent with Paget's. However, there is considerable amount of excoriations throughout the entire vulvar and medial thighs consistent with chronic vulvitis.In December 2013 she had a hernia repair due to bowel obstruction by Dr. Excell Seltzer with permanent mesh. In May 2013 she also had a TVT bladder by Dr. Everett Graff. She does continue to wear a pad and also takes Detrol twice daily but states that her urinary leakage is markedly improved. She described the vulvar irritation is intermittent itching with burning and that it has increased over time as has the pruritus. She does use a steroid cream twice daily and does scratch her vulva.   She underwent a wide local excision of the left vulva on January 2 of year 2014. Operative findings included 2 separate 3 x 3 and 1.2 cm lesions consistent with Paget's on the left.  Pathology revealed 2 foci of extramammary Paget's disease extending to the left inked margin in the right and anterior tip margins at multiple foci. The posterior margin was negative for malignancy.   I performed a biopsy of this it was consistent with recurrent Paget's disease. On 11/23/2012 showed a repeat wide local excision of liver x2 with the posterior fourchette biopsy. Findings revealed along the left labia minora/vagina is a Paget's disease measuring approximately 2.5 x 1 cm in the superior aspect of her prior left vulvar excision. The inferior aspect of a similar lesion. 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 On the left perineal body the area is somewhat excoriated pale. Biopsy was performed in this area.  Pathology revealed:  1. Vulva, biopsy, left inferior - EXTRAMAMMARY PAGET DISEASE EXTENDING TO THE EDGES OF THE BIOPSY. 2. Vulva, excision, left superior lesion suture marks 1200 - EXTRAMAMMARY PAGET DISEASE. - MARGINS NOT INVOLVED. - CLOSEST MARGIN 9 O'CLOCK, LESS THAN 0.1 CM. 3. Vulva, excision, left inferior excision suture marks 1200 - EXTRAMAMMARY PAGET DISEASE, 12 O'CLOCK MARGIN FOCALLY INVOLVED. - REMAINING MARGINS CLEAR.  We have discussed repeating surgery however she had multiple orthopedic issues had broken ankle. She was seen by [name] in December 2014. Exam at that time was fairly unremarkable.  I had seen her in January of 2015. At that time exam was concerning for Paget's.   On 08/02/2013 she underwent wide local excisions of the vulva x2.  Operative findings included pale pink raised lesion measuring 1 x 0.5 cm the left upper vulva near the prior excision site. There is a pale raised lesion measuring 1.5 x 1 cm on the left crossing midline perineal body.  Pathology revealed left superior vulvar excision with extramammary Paget's disease extending to the 9:00 margin. The other margins were negative for tumor. Of the left of midline lesion she extramammary Paget's disease extending to the 12:00 margin and focally at the 6:00 margin but otherwise negative margins. The patient at that time discussed with [place] that she was having some right lower quadrant pain. Her exam is very difficult secondary to habitus. There ultrasound  was obtained. Ultrasound revealed:  FINDINGS:  Uterus Measurements: 4.6 x 3.5 x 3.8 cm. 2.5 x 1.7 x 1.6 cm uterine fibroid. This is present in the right lateral aspect of the body uterus.  Endometrium Thickness: 6.1 mm. No focal abnormality visualized.  Right ovary: No focal abnormality.  Left ovary :No focal abnormality.  Other  findings: No free fluid  IMPRESSION:  Uterine fibroid otherwise negative exam.  I saw her March 16, 2014 at which time there was an area on the left vulva with hyperkeratosis. A biopsy of that area was performed consistent with Paget's disease. On March 29, 2014 she underwent wide local excision of the vulva. On exam she had a 1 cm left vulvar lesion just below the left labia minora. She underwent a wide local excision. He received a 3.7 x 1.7 x 0.3 cm lesion. There was extramammary Paget's disease extending to the 6 to 9:00 margin in the 12:00 margin. This is despite grossly negative margins on physical examination.   She had an excision in November that revealed Diagnosis Vulva, excision, w/stitch @ 1200 - EXTRAMAMMARY PAGET'S DISEASE, EXTENDING TO THE 6-9 O'CLOCK MARGIN AND 12 O'CLOCK PERPENDICULAR MARGIN.  I saw her in February 2016 at which time there was concern for recurrent disease. She decided to proceed with Aldara therapy. She used for approximate 4 weeks and then decided to stop it secondary to the expense of the medication. She then started using the clobetasol. While she was using the Aldara all the pruritus in her vulva resolved itself. Since she stopped it and was using the steroid cream the pruritus returned.  I saw her in May 2016 at which time she has small area consistent with Paget's on the right labia minora and a larger patch on the left. Biopsy on the right revealed extramammary Paget's. After discussion she wished to try the Aldara again. She called and requested an earlier appointment as for the last 2-3 weeks had increased itching and has been scratching so much that she's had some bleeding. I last saw her on July 6. At that time she had been having a lot of symptoms of pruritus and scratching a great deal. Vulvar examination at that time revealed a fairly macerated vulva and it was possible to discern what could be related to Paget's, effect of the Aldara therapy, trauma  from scratching. She was asked to stop the Aldara therapy and use a low-dose daily cream. We asked her to refrain from scratching come in today for evaluation.  I saw her again December 26, 2104 at which time there was evidence clearly a Paget's disease and she scheduled for surgery. On January 24, 2015 she underwent bilateral wide local excisions of the vulva. On the right vulva there was a 2 cm patch of Paget's disease in the mid labia majora. On the left side. The remaining labia minora in the area prior to the excision are smaller lesion consistent with Paget's disease.  Diagnosis 1. Vulva, excision, left wide excision - EXTRAMAMMARY PAGET'S DISEASE EXTENDING TO THE 12 O'CLOCK, 3 O'CLOCK, 6 O'CLOCK AND 9 O'CLOCK MARGINS. 2. Vulva, excision, right wide excision - EXTRAMAMMARY PAGET'S EXTENDING TO THE 3 O'CLOCK MARGIN AND THE 6 O'CLOCK POLE.  The patient was seen by Dr. Verdie Mosher piercing in June and at that time a patch on both the right and left was identified. They discussed short-term follow-up. She called complaining of bleeding and comes in today for a sooner visit. She is complaining of vulvar itching  and bleeding. She noticed that for the first time last week.  She is otherwise doing quite well and denies any complaints. She continues to have urinary incontinence. She is on eligible for any other outpatient treatment secondary to insurance purposes. She has not been back to see Dr. Mechele Collin.  Current Meds:  Outpatient Encounter Prescriptions as of 01/23/2016  Medication Sig Dispense Refill  . albuterol (PROVENTIL HFA;VENTOLIN HFA) 108 (90 BASE) MCG/ACT inhaler Inhale 2 puffs into the lungs every 4 (four) hours as needed for wheezing or shortness of breath. Reported on 08/01/2015    . albuterol (PROVENTIL) (2.5 MG/3ML) 0.083% nebulizer solution Take 2.5 mg by nebulization every 6 (six) hours as needed for wheezing or shortness of breath. Reported on 08/01/2015    . beta carotene w/minerals (OCUVITE)  tablet Take 2 tablets by mouth daily.     Marland Kitchen buPROPion (WELLBUTRIN XL) 150 MG 24 hr tablet Take 150 mg by mouth every morning.     . Calcium Carbonate-Vitamin D 600-125 MG-UNIT TABS Take 1 tablet by mouth daily.     . Cholecalciferol (VITAMIN D3) 1000 UNITS CAPS Take 2 capsules by mouth daily.    . citalopram (CELEXA) 40 MG tablet Take 40 mg by mouth every morning.     . clotrimazole-betamethasone (LOTRISONE) cream Apply topically 2 (two) times daily. 45 g 1  . docusate sodium (COLACE) 100 MG capsule Take 300 mg by mouth daily.     Marland Kitchen levothyroxine (SYNTHROID, LEVOTHROID) 175 MCG tablet TAKE 1 TABLET (175 MCG TOTAL) BY MOUTH DAILY.--  TAKING NAME BRAND SYNTHROID--  TAKES IN AM  2  . montelukast (SINGULAIR) 10 MG tablet     . Multiple Vitamins-Minerals (MULTIVITAMIN GUMMIES ADULT) CHEW Chew 2 tablets by mouth daily.    . Omega-3 1000 MG CAPS Take 1 g by mouth daily.     . pantoprazole (PROTONIX) 40 MG tablet Take 40 mg by mouth every morning.     . polyvinyl alcohol (LIQUIFILM TEARS) 1.4 % ophthalmic solution Place 1 drop into both eyes as needed for dry eyes.    Marland Kitchen spironolactone (ALDACTONE) 25 MG tablet Take 0.5-1 tablets (12.5-25 mg total) by mouth as needed (for swelling). 30 tablet 0   No facility-administered encounter medications on file as of 01/23/2016.     Allergy:  Allergies  Allergen Reactions  . Codeine Other (See Comments)    Hyper/ Hallucinations   . Demerol [Meperidine] Other (See Comments)    "Knocks me out"  . Diazepam Other (See Comments)    Extreme sedation; patient does not recall taking diazepam (12/07/14)  . Tape Hives and Other (See Comments)    "thick clear plastic tape" causes blisters  . Morphine And Related Itching and Rash    Per CRNA, pt tol Fentanyl IV for OR case, no issues reported to rec RN in PACU, no immediate PACU issues noted as well upon arrival  . Penicillins Hives and Swelling  . Valisone [Betamethasone] Rash    Social Hx:   Social History    Social History  . Marital status: Married    Spouse name: N/A  . Number of children: N/A  . Years of education: N/A   Occupational History  . Not on file.   Social History Main Topics  . Smoking status: Former Smoker    Packs/day: 2.00    Years: 41.00    Types: Cigarettes    Quit date: 06/05/1999  . Smokeless tobacco: Never Used  . Alcohol use No  .  Drug use: No  . Sexual activity: Not on file   Other Topics Concern  . Not on file   Social History Narrative  . No narrative on file    Past Surgical Hx:  Past Surgical History:  Procedure Laterality Date  . BLADDER SUSPENSION  10/13/2011   Procedure: TRANSVAGINAL TAPE (TVT) PROCEDURE;  Surgeon: Delice Lesch, MD;  Location: [facility];  Service: Gynecology;  Laterality: N/A;  . BREAST LUMPECTOMY Left 03-27-2004  . CARDIAC CATHETERIZATION  10-30-2000  dr Wynonia Lawman   normal coronary arteries and LVF/  ef 65-70%  . CLOSED MANIPULATION  W/ OPEN REDUCTION EXCHANGE TIBIAL FEMORAL BEARING POST TOTAL LEFT KNEE  04-14-2001  . CYSTOSCOPY  10/13/2011   Procedure: CYSTOSCOPY;  Surgeon: Delice Lesch, MD;  Location: [facility];  Service: Gynecology;  Laterality: Bilateral;  . DILATION AND CURETTAGE OF UTERUS  1968  . FOOT SURGERY Left 02-01-2009   TRIPLE ARTHRODESIS  . FOOT SURGERY Left 11/14   I&D left foot with woundvac placement  . INCISIONAL HERNIA REPAIR  05/05/2011   Procedure: LAPAROSCOPIC INCISIONAL HERNIA;  Surgeon: Edward Jolly, MD;  Location: [facility];  Service: General;  Laterality: N/A;  LAPAROSCOPIC REPAIR INCARCERATED INCISIONAL HERNIA  WITH MESH  . KNEE ARTHROSCOPY Bilateral left [id number] & 04-1992/   right [phone]  . LAPAROSCOPIC CHOLECYSTECTOMY  06-21-2010  . LUMBAR SPINE SURGERY  1998   L5 -- S1  . MASTECTOMY Right 1997   W/  LYMPH NODE DISSECTION AND RECONSTRUCTION WITH IMPLANTS AND EXPANDER  . PORT-A-CATH PLACEMENT AND REMOVAL  1997  . REMOVAL RIGHT BREAST IMPLANT AND CAPSULECTOMY  06-03-1999  . REVISION  TOTAL KNEE ARTHROPLASTY Left 07-07-2005  &  12-10-2001   12-10-2001  REMOVAL TOTAL KNEE/ I&D / INSERTION ANTIBIOTIC SPACER  . REVISION TOTAL KNEE ARTHROPLASTY Right 07-07-2005  . ROTATOR CUFF REPAIR Right   . TENDON REPAIR RIGHT RING FINGER  2011  . THUMB TRIGGER RELEASE Right 1993  . THYROIDECTOMY, PARTIAL  1983  . TONSILLECTOMY  1968  . TOTAL KNEE ARTHROPLASTY Bilateral right 05-04-2000/  left 03-29-2001  . VULVECTOMY  05/27/2012   Procedure: WIDE EXCISION VULVECTOMY;  Surgeon: Imagene Gurney A. Alycia Rossetti, MD;  Location: [facility];  Service: Gynecology;  Laterality: N/A;  Wide Local Excision of Vulva   . VULVECTOMY N/A 11/23/2012   Procedure: WIDE LOCAL EXCISION OF THE VULVA;  Surgeon: Imagene Gurney A. Alycia Rossetti, MD;  Location: [facility];  Service: Gynecology;  Laterality: N/A;  left inferior vulvar biopsy excision left superior lesion left inferior vulvar excision  . VULVECTOMY N/A 08/02/2013   Procedure: WIDE LOCAL  EXCISION VULVA;  Surgeon: Imagene Gurney A. Alycia Rossetti, MD;  Location: [facility];  Service: Gynecology;  Laterality: N/A;  . VULVECTOMY Left 03/29/2014   Procedure: WIDE LOCAL EXCISION OF LEFT VULVA ;  Surgeon: Imagene Gurney A. Alycia Rossetti, MD;  Location: [facility];  Service: Gynecology;  Laterality: Left;  Marland Kitchen VULVECTOMY Bilateral 01/24/2015   Procedure: WIDE LOCAL EXCISION VULVA;  Surgeon: [name], MD;  Location: [facility];  Service: Gynecology;  Laterality: Bilateral;  . VULVECTOMY PARTIAL  07-16-2010    Past Medical Hx:  Past Medical History:  Diagnosis Date  . Anxiety   . Arthritis   . Asthma   . At risk for sleep apnea    STOP-BANG= 4     SENT TO PCP 03-27-2014  . Chronic constipation   . Depression   . Fibromyalgia   . GERD (gastroesophageal reflux disease)   . History  of acute bronchitis    BEGINNING OF AUG 2016--  NO SYMPTOM FREE  . 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 History of colon polyps    2007 hyperplagia  . History of esophageal dilatation    2008  . History of peptic ulcer   . History of small bowel obstruction    2012  . Hypothyroidism   . Macular degeneration of right eye   . Numbness of left foot    4 toes  . Paget's disease of vulva   . PONV (postoperative nausea and vomiting)    and HARD TO WAKE  . Seasonal allergies   . SUI (stress urinary incontinence, female)   . Wears glasses     Family Hx:  Family History  Problem Relation Age of Onset  . Cancer Father 2    lung and kidney   . Alzheimer's disease Mother   . Heart attack Maternal Grandmother   . Alcohol abuse Brother     Vitals:  Blood pressure (!) 153/76, pulse 74, temperature 98.1 F (36.7 C), temperature source Oral, resp. rate 18, height 5\' 5"  (1.651 m), weight 232 lb 6.4 oz (105.4 kg), SpO2 96 %.  Physical Exam: Well-nourished well-developed female in no acute distress.  Abdomen: Obese, soft slightly tympanitic. Not distended. Exam is limited by habitus. Beneath her pannus, especially on the right side, is a considerable yeast inflamed skin.  Pelvic: Well-healed surgical incisions.The right vulva is notable for 1.5 x 1.5 cm well-demarcated patch in the lower aspect. His well-circumscribed and easy dyspnea margin. The left side is much more difficult with there is a 4 x 5 cm patch this difficult to ascertain portion of this is Paget's versus metastases versus secondary to excoriation. There is one small area of skin breakdown.  Assessment/Plan: 70 year old with recurrent vulvar Paget's disease. The lesion on the right is very well demarcated we can proceed with a wide local excision and primary closure. The left side is much more complicated in that I cannot tell what is consistent with Paget's that were this consistent with excoriation. We discussed proceeding with an exam under anesthesia and mapping biopsies to see what is  Paget's and what is normal skin. Fortunately scheduling her for this procedure next week on September 7 with my partner Dr. Janie Morning. I will contact the patient with the results after her mapping biopsies. She knows that I'll speak to Dr. Skeet Latch prior to that time so we can determine the appropriate procedure. If this large area is all consistent with Paget's disease will need to proceed with either a skin graft or a rotational flap to close the defect as there will not be enough skin redundancy to close it primarily. She understands that this will need to be done at [facility]. We will try to coordinate his many of the appointments for the same day to minimize her travel. Next number with regards to her urinary incontinence she was to put that on the back burner she would be willing to see the specialist at [facility] at that was only 30 minutes away.  Taqwa Deem A., MD 01/23/2016, 4:16 PM

## 2016-01-23 NOTE — Patient Instructions (Signed)
Plan to have an exam under anesthesia with vulvar biopsies with Dr. Janie Morning on September 7.  You will receive a phone call from the pre-surgical RN at the [facility] to discuss instructions.  Please call for any questions or concerns.

## 2016-01-25 ENCOUNTER — Encounter (HOSPITAL_BASED_OUTPATIENT_CLINIC_OR_DEPARTMENT_OTHER): Payer: Self-pay | Admitting: *Deleted

## 2016-01-25 NOTE — Progress Notes (Signed)
NPO AFTER MN.  ARRIVE AT 1015.  NEEDS HG.  WILL TAKE AM MEDS W/ SIPS OF WATER DOS.

## 2016-01-31 ENCOUNTER — Ambulatory Visit (HOSPITAL_BASED_OUTPATIENT_CLINIC_OR_DEPARTMENT_OTHER): Payer: Medicare Other | Admitting: Certified Registered"

## 2016-01-31 ENCOUNTER — Ambulatory Visit (HOSPITAL_BASED_OUTPATIENT_CLINIC_OR_DEPARTMENT_OTHER)
Admission: RE | Admit: 2016-01-31 | Discharge: 2016-01-31 | Disposition: A | Discharge status: Home / Self Care | Payer: Medicare Other | Source: Ambulatory Visit | Attending: Gynecologic Oncology | Admitting: Gynecologic Oncology

## 2016-01-31 ENCOUNTER — Encounter (HOSPITAL_BASED_OUTPATIENT_CLINIC_OR_DEPARTMENT_OTHER)
Admission: RE | Disposition: A | Discharge status: Home / Self Care | Payer: Self-pay | Source: Ambulatory Visit | Attending: Gynecologic Oncology

## 2016-01-31 DIAGNOSIS — H3530 Unspecified macular degeneration: Secondary | ICD-10-CM | POA: Diagnosis not present

## 2016-01-31 DIAGNOSIS — J45909 Unspecified asthma, uncomplicated: Secondary | ICD-10-CM | POA: Insufficient documentation

## 2016-01-31 DIAGNOSIS — Z9011 Acquired absence of right breast and nipple: Secondary | ICD-10-CM | POA: Diagnosis not present

## 2016-01-31 DIAGNOSIS — C519 Malignant neoplasm of vulva, unspecified: Secondary | ICD-10-CM | POA: Diagnosis present

## 2016-01-31 DIAGNOSIS — Z8544 Personal history of malignant neoplasm of other female genital organs: Secondary | ICD-10-CM | POA: Insufficient documentation

## 2016-01-31 DIAGNOSIS — K219 Gastro-esophageal reflux disease without esophagitis: Secondary | ICD-10-CM | POA: Diagnosis not present

## 2016-01-31 DIAGNOSIS — Z87891 Personal history of nicotine dependence: Secondary | ICD-10-CM | POA: Diagnosis not present

## 2016-01-31 DIAGNOSIS — R32 Unspecified urinary incontinence: Secondary | ICD-10-CM | POA: Insufficient documentation

## 2016-01-31 DIAGNOSIS — C512 Malignant neoplasm of clitoris: Secondary | ICD-10-CM | POA: Diagnosis not present

## 2016-01-31 DIAGNOSIS — L292 Pruritus vulvae: Secondary | ICD-10-CM | POA: Insufficient documentation

## 2016-01-31 DIAGNOSIS — E039 Hypothyroidism, unspecified: Secondary | ICD-10-CM | POA: Insufficient documentation

## 2016-01-31 DIAGNOSIS — Z96653 Presence of artificial knee joint, bilateral: Secondary | ICD-10-CM | POA: Diagnosis not present

## 2016-01-31 DIAGNOSIS — Z923 Personal history of irradiation: Secondary | ICD-10-CM | POA: Insufficient documentation

## 2016-01-31 DIAGNOSIS — M1990 Unspecified osteoarthritis, unspecified site: Secondary | ICD-10-CM | POA: Insufficient documentation

## 2016-01-31 DIAGNOSIS — F329 Major depressive disorder, single episode, unspecified: Secondary | ICD-10-CM | POA: Diagnosis not present

## 2016-01-31 DIAGNOSIS — Z9221 Personal history of antineoplastic chemotherapy: Secondary | ICD-10-CM | POA: Insufficient documentation

## 2016-01-31 DIAGNOSIS — Z79899 Other long term (current) drug therapy: Secondary | ICD-10-CM | POA: Insufficient documentation

## 2016-01-31 DIAGNOSIS — C44590 Other specified malignant neoplasm of anal skin: Secondary | ICD-10-CM | POA: Diagnosis not present

## 2016-01-31 DIAGNOSIS — Z08 Encounter for follow-up examination after completed treatment for malignant neoplasm: Secondary | ICD-10-CM | POA: Diagnosis present

## 2016-01-31 DIAGNOSIS — Z853 Personal history of malignant neoplasm of breast: Secondary | ICD-10-CM | POA: Diagnosis not present

## 2016-01-31 LAB — POCT HEMOGLOBIN-HEMACUE: Hemoglobin: 13.6 g/dL (ref 12.0–15.0)

## 2016-01-31 SURGERY — EXAM UNDER ANESTHESIA
Anesthesia: Monitor Anesthesia Care | Site: Vulva

## 2016-01-31 MED ORDER — PROPOFOL 10 MG/ML IV BOLUS
INTRAVENOUS | Status: AC
  Filled 2016-01-31: qty 20

## 2016-01-31 MED ORDER — PROPOFOL 500 MG/50ML IV EMUL
INTRAVENOUS | Status: DC | PRN
  Administered 2016-01-31: 100 ug/kg/min via INTRAVENOUS

## 2016-01-31 MED ORDER — FENTANYL CITRATE (PF) 100 MCG/2ML IJ SOLN
INTRAMUSCULAR | Status: AC
  Filled 2016-01-31: qty 2

## 2016-01-31 MED ORDER — TRAMADOL HCL 50 MG PO TABS
ORAL_TABLET | ORAL | Status: AC
  Filled 2016-01-31: qty 1

## 2016-01-31 MED ORDER — MIDAZOLAM HCL 2 MG/2ML IJ SOLN
INTRAMUSCULAR | Status: AC
  Filled 2016-01-31: qty 2

## 2016-01-31 MED ORDER — TRAMADOL HCL 50 MG PO TABS
50.0000 mg | ORAL_TABLET | Freq: Once | ORAL | Status: AC
  Administered 2016-01-31: 50 mg via ORAL
  Filled 2016-01-31: qty 1

## 2016-01-31 MED ORDER — MIDAZOLAM HCL 5 MG/5ML IJ SOLN
INTRAMUSCULAR | Status: DC | PRN
  Administered 2016-01-31: 2 mg via INTRAVENOUS

## 2016-01-31 MED ORDER — PROPOFOL 500 MG/50ML IV EMUL
INTRAVENOUS | Status: AC
  Filled 2016-01-31: qty 50

## 2016-01-31 MED ORDER — TRAMADOL HCL 50 MG PO TABS: 50.0000 mg | ORAL_TABLET | Freq: Four times a day (QID) | ORAL | 0 refills | Status: DC | PRN

## 2016-01-31 MED ORDER — ONDANSETRON HCL 4 MG/2ML IJ SOLN
4.0000 mg | Freq: Once | INTRAMUSCULAR | Status: DC | PRN
  Filled 2016-01-31: qty 2

## 2016-01-31 MED ORDER — HYDROMORPHONE HCL 1 MG/ML IJ SOLN
INTRAMUSCULAR | Status: AC
  Filled 2016-01-31: qty 1

## 2016-01-31 MED ORDER — FENTANYL CITRATE (PF) 100 MCG/2ML IJ SOLN
INTRAMUSCULAR | Status: DC | PRN
  Administered 2016-01-31: 50 ug via INTRAVENOUS
  Administered 2016-01-31 (×2): 25 ug via INTRAVENOUS

## 2016-01-31 MED ORDER — LACTATED RINGERS IV SOLN
INTRAVENOUS | Status: DC
  Administered 2016-01-31: 12:00:00 via INTRAVENOUS
  Filled 2016-01-31: qty 1000

## 2016-01-31 MED ORDER — PROPOFOL 10 MG/ML IV BOLUS
INTRAVENOUS | Status: DC | PRN
  Administered 2016-01-31 (×6): 20 mg via INTRAVENOUS

## 2016-01-31 MED ORDER — HYDROMORPHONE HCL 1 MG/ML IJ SOLN
0.2500 mg | INTRAMUSCULAR | Status: DC | PRN
  Administered 2016-01-31: 0.5 mg via INTRAVENOUS
  Filled 2016-01-31: qty 1

## 2016-01-31 MED FILL — traMADol HCL 50 MG TABS: 50 | 7 days supply | Qty: 30 | Fill #0

## 2016-01-31 SURGICAL SUPPLY — 39 items
BLADE SURG 15 STRL LF DISP TIS (BLADE) ×1 IMPLANT
BLADE SURG 15 STRL SS (BLADE) ×2
CANISTER SUCTION 1200CC (MISCELLANEOUS) IMPLANT
CANISTER SUCTION 2500CC (MISCELLANEOUS) ×1 IMPLANT
CATH ROBINSON RED A/P 16FR (CATHETERS) ×2 IMPLANT
DRAPE LG THREE QUARTER DISP (DRAPES) ×2 IMPLANT
DRAPE UNDERBUTTOCKS STRL (DRAPE) ×2 IMPLANT
DRSG TELFA 3X8 NADH (GAUZE/BANDAGES/DRESSINGS) ×4 IMPLANT
ELECT NDL TIP 2.8 STRL (NEEDLE) ×1 IMPLANT
ELECT NEEDLE TIP 2.8 STRL (NEEDLE) ×2 IMPLANT
ELECT REM PT RETURN 9FT ADLT (ELECTROSURGICAL) ×2
ELECTRODE REM PT RTRN 9FT ADLT (ELECTROSURGICAL) ×1 IMPLANT
GLOVE ECLIPSE 7.5 STRL STRAW (GLOVE) ×4 IMPLANT
GLOVE ECLIPSE 8.0 STRL XLNG CF (GLOVE) ×2 IMPLANT
GOWN STRL REUS W/ TWL LRG LVL3 (GOWN DISPOSABLE) ×1 IMPLANT
GOWN STRL REUS W/ TWL XL LVL3 (GOWN DISPOSABLE) ×1 IMPLANT
GOWN STRL REUS W/TWL LRG LVL3 (GOWN DISPOSABLE) ×2
GOWN STRL REUS W/TWL XL LVL3 (GOWN DISPOSABLE) ×2
KIT ROOM TURNOVER WOR (KITS) ×2 IMPLANT
LEGGING LITHOTOMY PAIR STRL (DRAPES) ×2 IMPLANT
NEEDLE HYPO 22GX1.5 SAFETY (NEEDLE) ×1 IMPLANT
NS IRRIG 500ML POUR BTL (IV SOLUTION) ×1 IMPLANT
PACK BASIN DAY SURGERY FS (CUSTOM PROCEDURE TRAY) ×2 IMPLANT
PAD DRESSING TELFA 3X8 NADH (GAUZE/BANDAGES/DRESSINGS) IMPLANT
PAD PREP 24X48 CUFFED NSTRL (MISCELLANEOUS) ×2 IMPLANT
PEN SKIN MARKING BROAD (MISCELLANEOUS) ×2 IMPLANT
PENCIL BUTTON HOLSTER BLD 10FT (ELECTRODE) ×2 IMPLANT
SCOPETTES 8  STERILE (MISCELLANEOUS) ×1
SCOPETTES 8 STERILE (MISCELLANEOUS) ×1 IMPLANT
SUT VIC AB 2-0 SH 27 (SUTURE) ×4
SUT VIC AB 2-0 SH 27XBRD (SUTURE) ×2 IMPLANT
SUT VIC AB 3-0 SH 18 (SUTURE) IMPLANT
SUT VIC AB 3-0 SH 27 (SUTURE)
SUT VIC AB 3-0 SH 27X BRD (SUTURE) IMPLANT
TOWEL OR 17X24 6PK STRL BLUE (TOWEL DISPOSABLE) ×4 IMPLANT
TRAY DSU PREP LF (CUSTOM PROCEDURE TRAY) ×2 IMPLANT
TUBE CONNECTING 12X1/4 (SUCTIONS) ×2 IMPLANT
WATER STERILE IRR 500ML POUR (IV SOLUTION) ×2 IMPLANT
YANKAUER SUCT BULB TIP NO VENT (SUCTIONS) ×2 IMPLANT

## 2016-01-31 NOTE — Anesthesia Postprocedure Evaluation (Signed)
Anesthesia Post Note  Patient: [name]  Procedure(s) Performed: Procedure(s) (LRB): EXAM UNDER ANESTHESIA AND VULVAR BIOPSIES (N/A)  Patient location during evaluation: PACU Anesthesia Type: MAC Level of consciousness: awake and alert Pain management: pain level controlled Vital Signs Assessment: post-procedure vital signs reviewed and stable Respiratory status: spontaneous breathing, nonlabored ventilation, respiratory function stable and patient connected to nasal cannula oxygen Cardiovascular status: stable and blood pressure returned to baseline Anesthetic complications: no    Last Vitals:  Vitals:   01/31/16 1330 01/31/16 1345  BP: 118/69 113/74  Pulse: 63 68  Resp: 15 18  Temp:      Last Pain:  Vitals:   01/31/16 1330  TempSrc:   PainSc: 0-No pain                 Smera Guyette J

## 2016-01-31 NOTE — H&P (View-Only) (Signed)
Consult Note: Gyn-Onc  [name] 70 y.o. female  CC:  Chief Complaint  Patient presents with  . 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 Some of the surgical margins were positive. Her postoperative course was uncomplicated. She was last seen by our service in 12/14. At that time there was no evidence of any lesions consistent with Paget's. However, there is considerable amount of excoriations throughout the entire vulvar and medial thighs consistent with chronic vulvitis.In December 2013 she had a hernia repair due to bowel obstruction by Dr. Excell Seltzer with permanent mesh. In May 2013 she also had a TVT bladder by Dr. Everett Graff. She does continue to wear a pad and also takes Detrol twice daily but states that her urinary leakage is markedly improved. She described the vulvar irritation is intermittent itching with burning and that it has increased over time as has the pruritus. She does use a steroid cream twice daily and does scratch her vulva.   She underwent a wide local excision of the left vulva on January 2 of year 2014. Operative findings included 2 separate 3 x 3 and 1.2 cm lesions consistent with Paget's on the left.  Pathology revealed 2 foci of extramammary Paget's disease extending to the left inked margin in the right and anterior tip margins at multiple foci. The posterior margin was negative for malignancy.   I performed a biopsy of this it was consistent with recurrent Paget's disease. On 11/23/2012 showed a repeat wide local excision of liver x2 with the posterior fourchette biopsy. Findings revealed along the left labia minora/vagina is a Paget's disease measuring approximately 2.5 x 1 cm in the superior aspect of her prior left vulvar excision. The inferior aspect of a similar lesion. 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 On the left perineal body the area is somewhat excoriated pale. Biopsy was performed in this area.  Pathology revealed:  1. Vulva, biopsy, left inferior - EXTRAMAMMARY PAGET DISEASE EXTENDING TO THE EDGES OF THE BIOPSY. 2. Vulva, excision, left superior lesion suture marks 1200 - EXTRAMAMMARY PAGET DISEASE. - MARGINS NOT INVOLVED. - CLOSEST MARGIN 9 O'CLOCK, LESS THAN 0.1 CM. 3. Vulva, excision, left inferior excision suture marks 1200 - EXTRAMAMMARY PAGET DISEASE, 12 O'CLOCK MARGIN FOCALLY INVOLVED. - REMAINING MARGINS CLEAR.  We have discussed repeating surgery however she had multiple orthopedic issues had broken ankle. She was seen by [name] in December 2014. Exam at that time was fairly unremarkable.  I had seen her in January of 2015. At that time exam was concerning for Paget's.   On 08/02/2013 she underwent wide local excisions of the vulva x2.  Operative findings included pale pink raised lesion measuring 1 x 0.5 cm the left upper vulva near the prior excision site. There is a pale raised lesion measuring 1.5 x 1 cm on the left crossing midline perineal body.  Pathology revealed left superior vulvar excision with extramammary Paget's disease extending to the 9:00 margin. The other margins were negative for tumor. Of the left of midline lesion she extramammary Paget's disease extending to the 12:00 margin and focally at the 6:00 margin but otherwise negative margins. The patient at that time discussed with [place] that she was having some right lower quadrant pain. Her exam is very difficult secondary to habitus. There ultrasound  was obtained. Ultrasound revealed:  FINDINGS:  Uterus Measurements: 4.6 x 3.5 x 3.8 cm. 2.5 x 1.7 x 1.6 cm uterine fibroid. This is present in the right lateral aspect of the body uterus.  Endometrium Thickness: 6.1 mm. No focal abnormality visualized.  Right ovary: No focal abnormality.  Left ovary :No focal abnormality.  Other  findings: No free fluid  IMPRESSION:  Uterine fibroid otherwise negative exam.  I saw her March 16, 2014 at which time there was an area on the left vulva with hyperkeratosis. A biopsy of that area was performed consistent with Paget's disease. On March 29, 2014 she underwent wide local excision of the vulva. On exam she had a 1 cm left vulvar lesion just below the left labia minora. She underwent a wide local excision. He received a 3.7 x 1.7 x 0.3 cm lesion. There was extramammary Paget's disease extending to the 6 to 9:00 margin in the 12:00 margin. This is despite grossly negative margins on physical examination.   She had an excision in November that revealed Diagnosis Vulva, excision, w/stitch @ 1200 - EXTRAMAMMARY PAGET'S DISEASE, EXTENDING TO THE 6-9 O'CLOCK MARGIN AND 12 O'CLOCK PERPENDICULAR MARGIN.  I saw her in February 2016 at which time there was concern for recurrent disease. She decided to proceed with Aldara therapy. She used for approximate 4 weeks and then decided to stop it secondary to the expense of the medication. She then started using the clobetasol. While she was using the Aldara all the pruritus in her vulva resolved itself. Since she stopped it and was using the steroid cream the pruritus returned.  I saw her in May 2016 at which time she has small area consistent with Paget's on the right labia minora and a larger patch on the left. Biopsy on the right revealed extramammary Paget's. After discussion she wished to try the Aldara again. She called and requested an earlier appointment as for the last 2-3 weeks had increased itching and has been scratching so much that she's had some bleeding. I last saw her on July 6. At that time she had been having a lot of symptoms of pruritus and scratching a great deal. Vulvar examination at that time revealed a fairly macerated vulva and it was possible to discern what could be related to Paget's, effect of the Aldara therapy, trauma  from scratching. She was asked to stop the Aldara therapy and use a low-dose daily cream. We asked her to refrain from scratching come in today for evaluation.  I saw her again December 26, 2104 at which time there was evidence clearly a Paget's disease and she scheduled for surgery. On January 24, 2015 she underwent bilateral wide local excisions of the vulva. On the right vulva there was a 2 cm patch of Paget's disease in the mid labia majora. On the left side. The remaining labia minora in the area prior to the excision are smaller lesion consistent with Paget's disease.  Diagnosis 1. Vulva, excision, left wide excision - EXTRAMAMMARY PAGET'S DISEASE EXTENDING TO THE 12 O'CLOCK, 3 O'CLOCK, 6 O'CLOCK AND 9 O'CLOCK MARGINS. 2. Vulva, excision, right wide excision - EXTRAMAMMARY PAGET'S EXTENDING TO THE 3 O'CLOCK MARGIN AND THE 6 O'CLOCK POLE.  The patient was seen by Dr. Verdie Mosher piercing in June and at that time a patch on both the right and left was identified. They discussed short-term follow-up. She called complaining of bleeding and comes in today for a sooner visit. She is complaining of vulvar itching  and bleeding. She noticed that for the first time last week.  She is otherwise doing quite well and denies any complaints. She continues to have urinary incontinence. She is on eligible for any other outpatient treatment secondary to insurance purposes. She has not been back to see Dr. Mechele Collin.  Current Meds:  Outpatient Encounter Prescriptions as of 01/23/2016  Medication Sig Dispense Refill  . albuterol (PROVENTIL HFA;VENTOLIN HFA) 108 (90 BASE) MCG/ACT inhaler Inhale 2 puffs into the lungs every 4 (four) hours as needed for wheezing or shortness of breath. Reported on 08/01/2015    . albuterol (PROVENTIL) (2.5 MG/3ML) 0.083% nebulizer solution Take 2.5 mg by nebulization every 6 (six) hours as needed for wheezing or shortness of breath. Reported on 08/01/2015    . beta carotene w/minerals (OCUVITE)  tablet Take 2 tablets by mouth daily.     Marland Kitchen buPROPion (WELLBUTRIN XL) 150 MG 24 hr tablet Take 150 mg by mouth every morning.     . Calcium Carbonate-Vitamin D 600-125 MG-UNIT TABS Take 1 tablet by mouth daily.     . Cholecalciferol (VITAMIN D3) 1000 UNITS CAPS Take 2 capsules by mouth daily.    . citalopram (CELEXA) 40 MG tablet Take 40 mg by mouth every morning.     . clotrimazole-betamethasone (LOTRISONE) cream Apply topically 2 (two) times daily. 45 g 1  . docusate sodium (COLACE) 100 MG capsule Take 300 mg by mouth daily.     Marland Kitchen levothyroxine (SYNTHROID, LEVOTHROID) 175 MCG tablet TAKE 1 TABLET (175 MCG TOTAL) BY MOUTH DAILY.--  TAKING NAME BRAND SYNTHROID--  TAKES IN AM  2  . montelukast (SINGULAIR) 10 MG tablet     . Multiple Vitamins-Minerals (MULTIVITAMIN GUMMIES ADULT) CHEW Chew 2 tablets by mouth daily.    . Omega-3 1000 MG CAPS Take 1 g by mouth daily.     . pantoprazole (PROTONIX) 40 MG tablet Take 40 mg by mouth every morning.     . polyvinyl alcohol (LIQUIFILM TEARS) 1.4 % ophthalmic solution Place 1 drop into both eyes as needed for dry eyes.    Marland Kitchen spironolactone (ALDACTONE) 25 MG tablet Take 0.5-1 tablets (12.5-25 mg total) by mouth as needed (for swelling). 30 tablet 0   No facility-administered encounter medications on file as of 01/23/2016.     Allergy:  Allergies  Allergen Reactions  . Codeine Other (See Comments)    Hyper/ Hallucinations   . Demerol [Meperidine] Other (See Comments)    "Knocks me out"  . Diazepam Other (See Comments)    Extreme sedation; patient does not recall taking diazepam (12/07/14)  . Tape Hives and Other (See Comments)    "thick clear plastic tape" causes blisters  . Morphine And Related Itching and Rash    Per CRNA, pt tol Fentanyl IV for OR case, no issues reported to rec RN in PACU, no immediate PACU issues noted as well upon arrival  . Penicillins Hives and Swelling  . Valisone [Betamethasone] Rash    Social Hx:   Social History    Social History  . Marital status: Married    Spouse name: N/A  . Number of children: N/A  . Years of education: N/A   Occupational History  . Not on file.   Social History Main Topics  . Smoking status: Former Smoker    Packs/day: 2.00    Years: 41.00    Types: Cigarettes    Quit date: 06/05/1999  . Smokeless tobacco: Never Used  . Alcohol use No  .  Drug use: No  . Sexual activity: Not on file   Other Topics Concern  . Not on file   Social History Narrative  . No narrative on file    Past Surgical Hx:  Past Surgical History:  Procedure Laterality Date  . BLADDER SUSPENSION  10/13/2011   Procedure: TRANSVAGINAL TAPE (TVT) PROCEDURE;  Surgeon: Delice Lesch, MD;  Location: [facility];  Service: Gynecology;  Laterality: N/A;  . BREAST LUMPECTOMY Left 03-27-2004  . CARDIAC CATHETERIZATION  10-30-2000  dr Wynonia Lawman   normal coronary arteries and LVF/  ef 65-70%  . CLOSED MANIPULATION  W/ OPEN REDUCTION EXCHANGE TIBIAL FEMORAL BEARING POST TOTAL LEFT KNEE  04-14-2001  . CYSTOSCOPY  10/13/2011   Procedure: CYSTOSCOPY;  Surgeon: Delice Lesch, MD;  Location: [facility];  Service: Gynecology;  Laterality: Bilateral;  . DILATION AND CURETTAGE OF UTERUS  1968  . FOOT SURGERY Left 02-01-2009   TRIPLE ARTHRODESIS  . FOOT SURGERY Left 11/14   I&D left foot with woundvac placement  . INCISIONAL HERNIA REPAIR  05/05/2011   Procedure: LAPAROSCOPIC INCISIONAL HERNIA;  Surgeon: Edward Jolly, MD;  Location: [facility];  Service: General;  Laterality: N/A;  LAPAROSCOPIC REPAIR INCARCERATED INCISIONAL HERNIA  WITH MESH  . KNEE ARTHROSCOPY Bilateral left [id number] & 04-1992/   right [phone]  . LAPAROSCOPIC CHOLECYSTECTOMY  06-21-2010  . LUMBAR SPINE SURGERY  1998   L5 -- S1  . MASTECTOMY Right 1997   W/  LYMPH NODE DISSECTION AND RECONSTRUCTION WITH IMPLANTS AND EXPANDER  . PORT-A-CATH PLACEMENT AND REMOVAL  1997  . REMOVAL RIGHT BREAST IMPLANT AND CAPSULECTOMY  06-03-1999  . REVISION  TOTAL KNEE ARTHROPLASTY Left 07-07-2005  &  12-10-2001   12-10-2001  REMOVAL TOTAL KNEE/ I&D / INSERTION ANTIBIOTIC SPACER  . REVISION TOTAL KNEE ARTHROPLASTY Right 07-07-2005  . ROTATOR CUFF REPAIR Right   . TENDON REPAIR RIGHT RING FINGER  2011  . THUMB TRIGGER RELEASE Right 1993  . THYROIDECTOMY, PARTIAL  1983  . TONSILLECTOMY  1968  . TOTAL KNEE ARTHROPLASTY Bilateral right 05-04-2000/  left 03-29-2001  . VULVECTOMY  05/27/2012   Procedure: WIDE EXCISION VULVECTOMY;  Surgeon: Imagene Gurney A. Alycia Rossetti, MD;  Location: [facility];  Service: Gynecology;  Laterality: N/A;  Wide Local Excision of Vulva   . VULVECTOMY N/A 11/23/2012   Procedure: WIDE LOCAL EXCISION OF THE VULVA;  Surgeon: Imagene Gurney A. Alycia Rossetti, MD;  Location: [facility];  Service: Gynecology;  Laterality: N/A;  left inferior vulvar biopsy excision left superior lesion left inferior vulvar excision  . VULVECTOMY N/A 08/02/2013   Procedure: WIDE LOCAL  EXCISION VULVA;  Surgeon: Imagene Gurney A. Alycia Rossetti, MD;  Location: [facility];  Service: Gynecology;  Laterality: N/A;  . VULVECTOMY Left 03/29/2014   Procedure: WIDE LOCAL EXCISION OF LEFT VULVA ;  Surgeon: Imagene Gurney A. Alycia Rossetti, MD;  Location: [facility];  Service: Gynecology;  Laterality: Left;  Marland Kitchen VULVECTOMY Bilateral 01/24/2015   Procedure: WIDE LOCAL EXCISION VULVA;  Surgeon: [name], MD;  Location: [facility];  Service: Gynecology;  Laterality: Bilateral;  . VULVECTOMY PARTIAL  07-16-2010    Past Medical Hx:  Past Medical History:  Diagnosis Date  . Anxiety   . Arthritis   . Asthma   . At risk for sleep apnea    STOP-BANG= 4     SENT TO PCP 03-27-2014  . Chronic constipation   . Depression   . Fibromyalgia   . GERD (gastroesophageal reflux disease)   . History  of acute bronchitis    BEGINNING OF AUG 2016--  NO SYMPTOM FREE  . 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 History of colon polyps    2007 hyperplagia  . History of esophageal dilatation    2008  . History of peptic ulcer   . History of small bowel obstruction    2012  . Hypothyroidism   . Macular degeneration of right eye   . Numbness of left foot    4 toes  . Paget's disease of vulva   . PONV (postoperative nausea and vomiting)    and HARD TO WAKE  . Seasonal allergies   . SUI (stress urinary incontinence, female)   . Wears glasses     Family Hx:  Family History  Problem Relation Age of Onset  . Cancer Father 3    lung and kidney   . Alzheimer's disease Mother   . Heart attack Maternal Grandmother   . Alcohol abuse Brother     Vitals:  Blood pressure (!) 153/76, pulse 74, temperature 98.1 F (36.7 C), temperature source Oral, resp. rate 18, height 5\' 5"  (1.651 m), weight 232 lb 6.4 oz (105.4 kg), SpO2 96 %.  Physical Exam: Well-nourished well-developed female in no acute distress.  Abdomen: Obese, soft slightly tympanitic. Not distended. Exam is limited by habitus. Beneath her pannus, especially on the right side, is a considerable yeast inflamed skin.  Pelvic: Well-healed surgical incisions.The right vulva is notable for 1.5 x 1.5 cm well-demarcated patch in the lower aspect. His well-circumscribed and easy dyspnea margin. The left side is much more difficult with there is a 4 x 5 cm patch this difficult to ascertain portion of this is Paget's versus metastases versus secondary to excoriation. There is one small area of skin breakdown.  Assessment/Plan: 70 year old with recurrent vulvar Paget's disease. The lesion on the right is very well demarcated we can proceed with a wide local excision and primary closure. The left side is much more complicated in that I cannot tell what is consistent with Paget's that were this consistent with excoriation. We discussed proceeding with an exam under anesthesia and mapping biopsies to see what is  Paget's and what is normal skin. Fortunately scheduling her for this procedure next week on September 7 with my partner Dr. Janie Morning. I will contact the patient with the results after her mapping biopsies. She knows that I'll speak to Dr. Skeet Latch prior to that time so we can determine the appropriate procedure. If this large area is all consistent with Paget's disease will need to proceed with either a skin graft or a rotational flap to close the defect as there will not be enough skin redundancy to close it primarily. She understands that this will need to be done at [facility]. We will try to coordinate his many of the appointments for the same day to minimize her travel. Next number with regards to her urinary incontinence she was to put that on the back burner she would be willing to see the specialist at [facility] at that was only 30 minutes away.  Amberly Livas A., MD 01/23/2016, 4:16 PM

## 2016-01-31 NOTE — Transfer of Care (Signed)
Immediate Anesthesia Transfer of Care Note  Patient: [name]  Procedure(s) Performed: Procedure(s) (LRB): EXAM UNDER ANESTHESIA AND VULVAR BIOPSIES (N/A)  Patient Location: PACU  Anesthesia Type: MAC  Level of Consciousness: awake, alert , oriented and patient cooperative  Airway & Oxygen Therapy: Patient Spontanous Breathing and Patient connected to face mask oxygen  Post-op Assessment: Report given to PACU RN and Post -op Vital signs reviewed and stable  Post vital signs: Reviewed and stable  Complications: No apparent anesthesia complications

## 2016-01-31 NOTE — Op Note (Addendum)
Preoperative Diagnosis: Extramammry Pagets Postoperative Diagnosis: Extramammry Pagets,  vulvar pruritis  Procedure(s) Performed:  Examination under anesthesia, Mapping of the left vulva with biopsies  Surgeon: Francetta Found.  Skeet Latch, M.D. PhD  Anesthesia: IV sedation  Indication for Procedure: Extramammary Paget's Disease  Operative Findings: Inferior aspect of the left labia minora and majora surgically absent. Multiple areas with white flaky skin changes were biopsied within the area and noted below as A.  Changes noted in the area of the clitoris the perineum and at the introitus just distal to the hymen, biopsies were also collected in these areas.   Procedure:[name] was taken to the OR and timeout performed.  She was then placed under anesthesia and her feet placed in the dorsal lithotomy position.  The vulva was prepped and draped in the usual sterile fashion.   A second time out was performed.   Using  A 3 mm punch biopsy the following areas were sampled    E.  Biopsies at the prior surgical margin  I.  Introital biopsies C.  Clitoral biopsy P.  Perineal biopsy A. Biopsy of areas concerning for Pagets M.  2cm  margin from the prior surgical margin   Specimens: E 1-8 demarcating the margin of the prior surgical excision C clitoral biopsy P.   Perineal biopsy I 1-2 introital biopsies  I2 is the biopsy closest to the urethral meatus M 1-7   Biopsies collected 2 cm lateral to the prior surgical margin   Estimated Blood Loss: < 5 mL. Blood Replacement: *none    Sponge, lap and needle counts were correct x 3.    Complications: None  The patient had sequential compression devices for VTE prophylaxis.          Disposition: PACU - hemodynamically stable.         Condition: Stable

## 2016-01-31 NOTE — Interval H&P Note (Signed)
History and Physical Interval Note:  01/31/2016 11:32 AM  [name]  has presented today for surgery, with the diagnosis of padgett's disease  The various methods of treatment have been discussed with the patient and family. After consideration of risks, benefits and other options for treatment, the patient has consented to  Procedure(s): EXAM UNDER ANESTHESIA AND VULVAR BIOPSIES (N/A) as a surgical intervention .  The patient's history has been reviewed, patient examined, no change in status, stable for surgery.  I have reviewed the patient's chart and labs.  Questions were answered to the patient's satisfaction.     [place], [facility]

## 2016-01-31 NOTE — Anesthesia Preprocedure Evaluation (Signed)
Anesthesia Evaluation  Patient identified by MRN, date of birth, ID band Patient awake    Reviewed: Allergy & Precautions, NPO status , Patient's Chart, lab work & pertinent test results  History of Anesthesia Complications (+) PONV  Airway Mallampati: I  TM Distance: >3 FB Neck ROM: Full    Dental   Pulmonary former smoker,    Pulmonary exam normal        Cardiovascular Normal cardiovascular exam     Neuro/Psych    GI/Hepatic GERD  Medicated and Controlled,  Endo/Other    Renal/GU      Musculoskeletal   Abdominal   Peds  Hematology   Anesthesia Other Findings   Reproductive/Obstetrics                             Anesthesia Physical Anesthesia Plan  ASA: II  Anesthesia Plan: General   Post-op Pain Management:    Induction: Intravenous  Airway Management Planned: LMA  Additional Equipment:   Intra-op Plan:   Post-operative Plan: Extubation in OR  Informed Consent: I have reviewed the patients History and Physical, chart, labs and discussed the procedure including the risks, benefits and alternatives for the proposed anesthesia with the patient or authorized representative who has indicated his/her understanding and acceptance.     Plan Discussed with: CRNA and Surgeon  Anesthesia Plan Comments:         Anesthesia Quick Evaluation

## 2016-02-06 ENCOUNTER — Encounter: Payer: Self-pay | Admitting: Gynecologic Oncology

## 2016-02-06 ENCOUNTER — Ambulatory Visit: Payer: Medicare Other | Attending: Gynecologic Oncology | Admitting: Gynecologic Oncology

## 2016-02-06 VITALS — BP 132/42 | HR 69 | Temp 97.8°F | Resp 18 | Ht 65.0 in | Wt 226.4 lb

## 2016-02-06 DIAGNOSIS — J45909 Unspecified asthma, uncomplicated: Secondary | ICD-10-CM | POA: Insufficient documentation

## 2016-02-06 DIAGNOSIS — M1990 Unspecified osteoarthritis, unspecified site: Secondary | ICD-10-CM | POA: Diagnosis not present

## 2016-02-06 DIAGNOSIS — Z87891 Personal history of nicotine dependence: Secondary | ICD-10-CM | POA: Diagnosis not present

## 2016-02-06 DIAGNOSIS — E039 Hypothyroidism, unspecified: Secondary | ICD-10-CM | POA: Insufficient documentation

## 2016-02-06 DIAGNOSIS — F419 Anxiety disorder, unspecified: Secondary | ICD-10-CM | POA: Insufficient documentation

## 2016-02-06 DIAGNOSIS — Z853 Personal history of malignant neoplasm of breast: Secondary | ICD-10-CM | POA: Insufficient documentation

## 2016-02-06 DIAGNOSIS — Z86010 Personal history of colonic polyps: Secondary | ICD-10-CM | POA: Diagnosis not present

## 2016-02-06 DIAGNOSIS — F329 Major depressive disorder, single episode, unspecified: Secondary | ICD-10-CM | POA: Insufficient documentation

## 2016-02-06 DIAGNOSIS — M797 Fibromyalgia: Secondary | ICD-10-CM | POA: Insufficient documentation

## 2016-02-06 DIAGNOSIS — C4499 Other specified malignant neoplasm of skin, unspecified: Secondary | ICD-10-CM

## 2016-02-06 DIAGNOSIS — K219 Gastro-esophageal reflux disease without esophagitis: Secondary | ICD-10-CM | POA: Insufficient documentation

## 2016-02-06 DIAGNOSIS — C519 Malignant neoplasm of vulva, unspecified: Secondary | ICD-10-CM | POA: Diagnosis present

## 2016-02-06 DIAGNOSIS — K5909 Other constipation: Secondary | ICD-10-CM | POA: Diagnosis not present

## 2016-02-06 DIAGNOSIS — H3530 Unspecified macular degeneration: Secondary | ICD-10-CM | POA: Insufficient documentation

## 2016-02-06 NOTE — Progress Notes (Signed)
Consult Note: Gyn-Onc  Vanessa SEMO 70 y.o. female  CC:  No chief complaint on file.   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 Some of the surgical margins were positive. Her postoperative course was uncomplicated. She was last seen by our service in 12/14. At that time there was no evidence of any lesions consistent with Paget's. However, there is considerable amount of excoriations throughout the entire vulvar and medial thighs consistent with chronic vulvitis.In December 2013 she had a hernia repair due to bowel obstruction by Dr. Excell Seltzer with permanent mesh. In May 2013 she also had a TVT bladder by Dr. Everett Graff. She does continue to wear a pad and also takes Detrol twice daily but states that her urinary leakage is markedly improved. She described the vulvar irritation is intermittent itching with burning and that it has increased over time as has the pruritus. She does use a steroid cream twice daily and does scratch her vulva.   She underwent a wide local excision of the left vulva on January 2 of year 2014. Operative findings included 2 separate 3 x 3 and 1.2 cm lesions consistent with Paget's on the left.  Pathology revealed 2 foci of extramammary Paget's disease extending to the left inked margin in the right and anterior tip margins at multiple foci. The posterior margin was negative for malignancy.   I performed a biopsy of this it was consistent with recurrent Paget's disease. On 11/23/2012 showed a repeat wide local excision of liver x2 with the posterior fourchette biopsy. Findings revealed along the left labia minora/vagina is a Paget's disease measuring approximately 2.5 x 1 cm in the superior aspect of her prior left vulvar excision. The inferior aspect of a similar lesion. 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 On the left perineal body the area is  somewhat excoriated pale. Biopsy was performed in this area.  Pathology revealed:  1. Vulva, biopsy, left inferior - EXTRAMAMMARY PAGET DISEASE EXTENDING TO THE EDGES OF THE BIOPSY. 2. Vulva, excision, left superior lesion suture marks 1200 - EXTRAMAMMARY PAGET DISEASE. - MARGINS NOT INVOLVED. - CLOSEST MARGIN 9 O'CLOCK, LESS THAN 0.1 CM. 3. Vulva, excision, left inferior excision suture marks 1200 - EXTRAMAMMARY PAGET DISEASE, 12 O'CLOCK MARGIN FOCALLY INVOLVED. - REMAINING MARGINS CLEAR.  We have discussed repeating surgery however she had multiple orthopedic issues had broken ankle. She was seen by [name] in December 2014. Exam at that time was fairly unremarkable.  I had seen her in January of 2015. At that time exam was concerning for Paget's.   On 08/02/2013 she underwent wide local excisions of the vulva x2.  Operative findings included pale pink raised lesion measuring 1 x 0.5 cm the left upper vulva near the prior excision site. There is a pale raised lesion measuring 1.5 x 1 cm on the left crossing midline perineal body.  Pathology revealed left superior vulvar excision with extramammary Paget's disease extending to the 9:00 margin. The other margins were negative for tumor. Of the left of midline lesion she extramammary Paget's disease extending to the 12:00 margin and focally at the 6:00 margin but otherwise negative margins. The patient at that time discussed with [place] that she was having some right lower quadrant pain. Her exam is very difficult secondary to habitus. There ultrasound was obtained. Ultrasound revealed:  FINDINGS:  Uterus Measurements: 4.6 x 3.5 x  3.8 cm. 2.5 x 1.7 x 1.6 cm uterine fibroid. This is present in the right lateral aspect of the body uterus.  Endometrium Thickness: 6.1 mm. No focal abnormality visualized.  Right ovary: No focal abnormality.  Left ovary :No focal abnormality.  Other findings: No free fluid  IMPRESSION:  Uterine fibroid  otherwise negative exam.  I saw her March 16, 2014 at which time there was an area on the left vulva with hyperkeratosis. A biopsy of that area was performed consistent with Paget's disease. On March 29, 2014 she underwent wide local excision of the vulva. On exam she had a 1 cm left vulvar lesion just below the left labia minora. She underwent a wide local excision. He received a 3.7 x 1.7 x 0.3 cm lesion. There was extramammary Paget's disease extending to the 6 to 9:00 margin in the 12:00 margin. This is despite grossly negative margins on physical examination.   She had an excision in November that revealed Diagnosis Vulva, excision, w/stitch @ 1200 - EXTRAMAMMARY PAGET'S DISEASE, EXTENDING TO THE 6-9 O'CLOCK MARGIN AND 12 O'CLOCK PERPENDICULAR MARGIN.  I saw her in February 2016 at which time there was concern for recurrent disease. She decided to proceed with Aldara therapy. She used for approximate 4 weeks and then decided to stop it secondary to the expense of the medication. She then started using the clobetasol. While she was using the Aldara all the pruritus in her vulva resolved itself. Since she stopped it and was using the steroid cream the pruritus returned.  I saw her in May 2016 at which time she has small area consistent with Paget's on the right labia minora and a larger patch on the left. Biopsy on the right revealed extramammary Paget's. After discussion she wished to try the Aldara again. She called and requested an earlier appointment as for the last 2-3 weeks had increased itching and has been scratching so much that she's had some bleeding. I last saw her on July 6. At that time she had been having a lot of symptoms of pruritus and scratching a great deal. Vulvar examination at that time revealed a fairly macerated vulva and it was possible to discern what could be related to Paget's, effect of the Aldara therapy, trauma from scratching. She was asked to stop the Aldara  therapy and use a low-dose daily cream. We asked her to refrain from scratching come in today for evaluation.  I saw her again December 26, 2104 at which time there was evidence clearly a Paget's disease and she scheduled for surgery. On January 24, 2015 she underwent bilateral wide local excisions of the vulva. On the right vulva there was a 2 cm patch of Paget's disease in the mid labia majora. On the left side. The remaining labia minora in the area prior to the excision are smaller lesion consistent with Paget's disease.  Diagnosis 1. Vulva, excision, left wide excision - EXTRAMAMMARY PAGET'S DISEASE EXTENDING TO THE 12 O'CLOCK, 3 O'CLOCK, 6 O'CLOCK AND 9 O'CLOCK MARGINS. 2. Vulva, excision, right wide excision - EXTRAMAMMARY PAGET'S EXTENDING TO THE 3 O'CLOCK MARGIN AND THE 6 O'CLOCK POLE.  01/31/16: Operative Findings: Inferior aspect of the left labia minora and majora surgically absent. Multiple areas with white flaky skin changes were biopsied within the area and noted below as A.  Changes noted in the area of the clitoris the perineum and at the introitus just distal to the hymen, biopsies were also collected in these areas.  Diagnosis  1. Vulva, biopsy, E1 - EXTRAMAMMARY PAGET'S DISEASE EXTENDING TO THE MARGINS. 2. Vulva, biopsy, E2 - FOCAL EXTRAMAMMARY PAGET'S DISEASE. MARGINS NOT INVOLVED. 3. Vulva, biopsy, E3 - HYPERKERATOSIS AND SLIGHT CHRONIC INFLAMMATION. NO EXTRAMAMMARY PAGET'S DISEASE IDENTIFIED. 4. Vulva, biopsy, E4 - EXTRAMAMMARY PAGET'S DISEASE EXTENDING TO THE MARGINS. 5. Vulva, biopsy, E5 - EXTRAMAMMARY PAGET'S DISEASE EXTENDING TO THE MARGINS. 6. Vulva, biopsy, E6 - FOCAL EXTRAMAMMARY PAGET'S DISEASE. MARGINS NOT INVOLVED. 7. Vulva, biopsy, A1 - EXTRAMAMMARY PAGET'S DISEASE EXTENDING TO THE MARGINS. 8. Vulva, biopsy, A2 - EXTRAMAMMARY PAGET'S DISEASE EXTENDING TO THE MARGINS. 9. Vulva, biopsy, A3 - EXTRAMAMMARY PAGET'S DISEASE EXTENDING TO THE MARGINS. 10. Vulva,  biopsy, E7 - SLIGHT HYPERKERATOSIS. NO EXTRAMAMMARY PAGET'S DISEASE IDENTIFIED. 11. Vagina, biopsy, introitus I1 - EXTRAMAMMARY PAGET'S DISEASE EXTENDING TO THE MARGINS. 12. Vagina, biopsy, introitus I2 - EXTRAMAMMARY PAGET'S DISEASE EXTENDING TO THE MARGINS. 13. Vulva, biopsy, E8 - EXTRAMAMMARY PAGET'S DISEASE FOCALLY INVOLVING ONE MARGIN. 14. Vulva, biopsy, margin M1 - FOCAL EXTRAMAMMARY PAGET'S DISEASE. MARGINS NOT INVOLVED. 1 of 3 FINAL for Iams, Nickayla C [phone]) Diagnosis(continued) 15. Vulva, biopsy, margin M2 - FOCAL EXTRAMAMMARY PAGET'S DISEASE. MARGINS NOT INVOLVED. 16. Vulva, biopsy, margin M3 - FOCAL EXTRAMAMMARY PAGET'S DISEASE. MARGINS NOT INVOLVED. 17. Vulva, biopsy, margin M4 - FOCAL EXTRAMAMMARY PAGET'S DISEASE. MARGINS NOT INVOLVED. 18. Vulva, biopsy, margin M5 - HYPERKERATOSIS AND MILD CHRONIC INFLAMMATION. NO EXTRAMAMMARY PAGET'S DISEASE IDENTIFIED. 19. Vulva, biopsy, margin M6 - FOCAL EXTRAMAMMARY PAGET'S DISEASE. MARGINS NOT INVOLVED. 20. Vulva, biopsy, margin M7 - BENIGN DERMAL CONNECTIVE TISSUE. NO EPIDERMIS PRESENT. 21. Perineum, biopsy, P1 - EXTRAMAMMARY PAGET'S DISEASE EXTENDING TO THE MARGINS. 24. Clitoris, biopsy - EXTRAMAMMARY PAGET'S DISEASE INVOLVING ONE MARGIN.  Please refer to the photograph in the operative note from Dr. Skeet Latch for complete mapping of her lesion. She still having a little bit of spotting from the biopsy sites and some pain.  Current Meds:  Outpatient Encounter Prescriptions as of 02/06/2016  Medication Sig Dispense Refill  . albuterol (PROVENTIL HFA;VENTOLIN HFA) 108 (90 BASE) MCG/ACT inhaler Inhale 2 puffs into the lungs every 4 (four) hours as needed for wheezing or shortness of breath. Reported on 08/01/2015    . albuterol (PROVENTIL) (2.5 MG/3ML) 0.083% nebulizer solution Take 2.5 mg by nebulization every 6 (six) hours as needed for wheezing or shortness of breath. Reported on 08/01/2015    . beta carotene w/minerals  (OCUVITE) tablet Take 2 tablets by mouth daily.     Marland Kitchen buPROPion (WELLBUTRIN XL) 150 MG 24 hr tablet Take 150 mg by mouth every morning.     . Calcium Carbonate-Vitamin D 600-125 MG-UNIT TABS Take 1 tablet by mouth daily.     . Cholecalciferol (VITAMIN D3) 1000 UNITS CAPS Take 2 capsules by mouth daily.    . citalopram (CELEXA) 40 MG tablet Take 40 mg by mouth every morning.     . clotrimazole-betamethasone (LOTRISONE) cream Apply topically 2 (two) times daily. 45 g 1  . docusate sodium (COLACE) 100 MG capsule Take 300 mg by mouth daily.     Marland Kitchen levothyroxine (SYNTHROID, LEVOTHROID) 175 MCG tablet TAKE 1 TABLET (175 MCG TOTAL) BY MOUTH DAILY.--  TAKING NAME BRAND SYNTHROID--  TAKES IN AM  2  . montelukast (SINGULAIR) 10 MG tablet Take 10 mg by mouth daily as needed.     . Multiple Vitamins-Minerals (MULTIVITAMIN GUMMIES ADULT) CHEW Chew 2 tablets by mouth daily.    . Omega-3 1000 MG CAPS Take 1 g by mouth daily.     . pantoprazole (  PROTONIX) 40 MG tablet Take 40 mg by mouth every morning.     . polyvinyl alcohol (LIQUIFILM TEARS) 1.4 % ophthalmic solution Place 1 drop into both eyes as needed for dry eyes.    Marland Kitchen spironolactone (ALDACTONE) 25 MG tablet Take 0.5-1 tablets (12.5-25 mg total) by mouth as needed (for swelling). 30 tablet 0  . traMADol (ULTRAM) 50 MG tablet Take 1 tablet (50 mg total) by mouth every 6 (six) hours as needed. 30 tablet 0   No facility-administered encounter medications on file as of 02/06/2016.     Allergy:  Allergies  Allergen Reactions  . Codeine Other (See Comments)    Hyper/ Hallucinations   . Demerol [Meperidine] Other (See Comments)    "Knocks me out"  . Diazepam Other (See Comments)    Extreme sedation; patient does not recall taking diazepam (12/07/14)  . Tape Hives and Other (See Comments)    "thick clear plastic tape" causes blisters  . Morphine And Related Itching and Rash    Per CRNA, pt tol Fentanyl IV for OR case, no issues reported to rec RN in PACU,  no immediate PACU issues noted as well upon arrival  . Penicillins Hives and Swelling  . Valisone [Betamethasone] Rash    Social Hx:   Social History   Social History  . Marital status: Married    Spouse name: N/A  . Number of children: N/A  . Years of education: N/A   Occupational History  . Not on file.   Social History Main Topics  . Smoking status: Former Smoker    Packs/day: 2.00    Years: 41.00    Types: Cigarettes    Quit date: 06/05/1999  . Smokeless tobacco: Never Used  . Alcohol use No  . Drug use: No  . Sexual activity: Not on file   Other Topics Concern  . Not on file   Social History Narrative  . No narrative on file    Past Surgical Hx:  Past Surgical History:  Procedure Laterality Date  . BLADDER SUSPENSION  10/13/2011   Procedure: TRANSVAGINAL TAPE (TVT) PROCEDURE;  Surgeon: Delice Lesch, MD;  Location: [facility];  Service: Gynecology;  Laterality: N/A;  . BREAST LUMPECTOMY Left 03-27-2004  . CARDIAC CATHETERIZATION  10-30-2000  dr Wynonia Lawman   normal coronary arteries and LVF/  ef 65-70%  . CLOSED MANIPULATION  W/ OPEN REDUCTION EXCHANGE TIBIAL FEMORAL BEARING POST TOTAL LEFT KNEE  04-14-2001  . CYSTOSCOPY  10/13/2011   Procedure: CYSTOSCOPY;  Surgeon: Delice Lesch, MD;  Location: [facility];  Service: Gynecology;  Laterality: Bilateral;  . DILATION AND CURETTAGE OF UTERUS  1968  . FOOT SURGERY Left 02-01-2009   TRIPLE ARTHRODESIS  . FOOT SURGERY Left 11/14   I&D left foot with woundvac placement  . INCISIONAL HERNIA REPAIR  05/05/2011   Procedure: LAPAROSCOPIC INCISIONAL HERNIA;  Surgeon: Edward Jolly, MD;  Location: [facility];  Service: General;  Laterality: N/A;  LAPAROSCOPIC REPAIR INCARCERATED INCISIONAL HERNIA  WITH MESH  . KNEE ARTHROSCOPY Bilateral left [id number] & 04-1992/   right [phone]  . LAPAROSCOPIC CHOLECYSTECTOMY  06-21-2010  . LUMBAR SPINE SURGERY  1998   L5 -- S1  . MASTECTOMY Right 1997   W/  LYMPH NODE DISSECTION AND  RECONSTRUCTION WITH IMPLANTS AND EXPANDER  . NEGATIVE SLEEP STUDY  12/26/2015  . PORT-A-CATH PLACEMENT AND REMOVAL  1997  . REMOVAL RIGHT BREAST IMPLANT AND CAPSULECTOMY  06-03-1999  . REVISION TOTAL KNEE ARTHROPLASTY Left 07-07-2005  &  12-10-2001   12-10-2001  REMOVAL TOTAL KNEE/ I&D / INSERTION ANTIBIOTIC SPACER  . REVISION TOTAL KNEE ARTHROPLASTY Right 07-07-2005  . ROTATOR CUFF REPAIR Right   . TENDON REPAIR RIGHT RING FINGER  2011  . THUMB TRIGGER RELEASE Right 1993  . THYROIDECTOMY, PARTIAL  1983  . TONSILLECTOMY  1968  . TOTAL KNEE ARTHROPLASTY Bilateral right 05-04-2000/  left 03-29-2001  . VULVECTOMY  05/27/2012   Procedure: WIDE EXCISION VULVECTOMY;  Surgeon: Imagene Gurney A. Alycia Rossetti, MD;  Location: [facility];  Service: Gynecology;  Laterality: N/A;  Wide Local Excision of Vulva   . VULVECTOMY N/A 11/23/2012   Procedure: WIDE LOCAL EXCISION OF THE VULVA;  Surgeon: Imagene Gurney A. Alycia Rossetti, MD;  Location: [facility];  Service: Gynecology;  Laterality: N/A;  left inferior vulvar biopsy excision left superior lesion left inferior vulvar excision  . VULVECTOMY N/A 08/02/2013   Procedure: WIDE LOCAL  EXCISION VULVA;  Surgeon: Imagene Gurney A. Alycia Rossetti, MD;  Location: [facility];  Service: Gynecology;  Laterality: N/A;  . VULVECTOMY Left 03/29/2014   Procedure: WIDE LOCAL EXCISION OF LEFT VULVA ;  Surgeon: Imagene Gurney A. Alycia Rossetti, MD;  Location: [facility];  Service: Gynecology;  Laterality: Left;  Marland Kitchen VULVECTOMY Bilateral 01/24/2015   Procedure: WIDE LOCAL EXCISION VULVA;  Surgeon: [name], MD;  Location: [place];  Service: Gynecology;  Laterality: Bilateral;  . VULVECTOMY PARTIAL  07-16-2010    Past Medical Hx:  Past Medical History:  Diagnosis Date  . Anxiety   . Arthritis   . Asthma   . Chronic constipation   . Depression   . Fibromyalgia   . GERD (gastroesophageal reflux disease)   . 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 History of colon polyps    2007 hyperplagia  . History of esophageal dilatation    2008  . History of peptic ulcer   . History of small bowel obstruction    2012  . Hypothyroidism   . Macular degeneration of right eye   . Numbness of left foot    4 toes  . Paget's disease of vulva   . PONV (postoperative nausea and vomiting)    and HARD TO WAKE  . Seasonal allergies   . SUI (stress urinary incontinence, female)   . Wears glasses     Family Hx:  Family History  Problem Relation Age of Onset  . Cancer Father 48    lung and kidney   . Alzheimer's disease Mother   . Heart attack Maternal Grandmother   . Alcohol abuse Brother     Vitals:  There were no vitals taken for this visit.  Physical Exam: Well-nourished well-developed female in no acute distress.  Abdomen: Obese, soft slightly tympanitic. Not distended. Exam is limited by habitus. Beneath her pannus, especially on the right side, is a considerable yeast inflamed skin.  Pelvic: Multiple biopsy sites healing. No significant infection or exudate.  Assessment/Plan: 70 year old with recurrent vulvar Paget's disease. She went a comprehensive mapping procedure in the operating room to really get an idea of the extent of her disease. It is fairly diffuse and I believe that we will need to proceed with a very wide excision of the left with a smaller excision on the right. The right sided lesion can be closed primarily. We will need plastic surgery  to help with either a skin graft or a flap to close the lesion on the left. She has an appointment with Dr.Bhatt at [facility] on September 21. We'll try to coordinate her preop visit for that same day. We will most likely be up to schedule her surgery for the first week in October.  We will arrange for her husband to stay at the [facility]. The patient requests that she not be a first  case. She understands that she'll need to be in the hospital for 3-7 days. Her follow-up appointments with regards to GYN oncology can be done in [place]. However, she and her husband understand that her appointments with plastic surgery will need to be done at [facility]. They both understand that even with a wide excision she still is at risk for recurrent Paget's.  Horace Lukas A., MD 02/06/2016, 10:07 AM

## 2016-02-06 NOTE — Patient Instructions (Addendum)
Plan to meet with Dr. Lynnell Jude in plastic surgery at [facility] on February 14, 2016 at 10:00am.  He is located at: [address] [place], [place] [address].  His office number is [phone].  You will also receive a phone call from [facility] about your pre-op appt.  We will also try to make a preop visit that same day in anticipation of planning your combined surgery. You should plan on being in the hospital anywhere from 3-7 days after this procedure.

## 2016-03-14 ENCOUNTER — Telehealth: Payer: Self-pay | Admitting: Gynecologic Oncology

## 2016-03-14 DIAGNOSIS — G8918 Other acute postprocedural pain: Secondary | ICD-10-CM

## 2016-03-14 MED ORDER — OXYCODONE HCL 5 MG PO TABS: 5.0000 mg | ORAL_TABLET | ORAL | 0 refills | Status: DC | PRN

## 2016-03-14 MED FILL — oxyCODONE HCL 5 MG TABS: 5 | 5 days supply | Qty: 60 | Fill #0

## 2016-03-14 NOTE — Telephone Encounter (Signed)
Patient called reporting moderate pain with little relief using oxycodone 5 mg given to her at discharge from [facility].  Stating she is having drainage from the vulva.  Her husband who has been changing the dressing states it looks like some of the stitches have come loose.  There is a home health RN that comes to the house twice weekly.  He does not feel the incision/wounds are infected.  Patient taking tylenol and ibuprofen as well.  She only has 6 oxycodone left and asking about a refill before the weekend.  Patient advised she can take up to two tablets of the oxycodone as needed.  Patient's husband to come and pick up the script today.  Reportable signs and symptoms reviewed.  Advised to contact [facility] or our office with an concerns.

## 2016-04-01 ENCOUNTER — Telehealth: Payer: Self-pay | Admitting: Gynecologic Oncology

## 2016-04-01 NOTE — Telephone Encounter (Signed)
Returned call to patient.  "Looks like pussy stuff in the tubing of my catheter."  No fever, chills.  No hematuria but seeing one tiny streak of blood in the tubing.  Advised to keep monitoring and the white areas in her tubing are most likely tissue from her bladder.  For removal at [facility] on Nov 14.  She is to call our office with any changes and to give [place] an update.

## 2016-04-08 ENCOUNTER — Ambulatory Visit: Payer: Medicare Other | Attending: Gynecologic Oncology | Admitting: Gynecologic Oncology

## 2016-04-08 ENCOUNTER — Encounter: Payer: Self-pay | Admitting: Gynecologic Oncology

## 2016-04-08 VITALS — BP 108/53 | HR 83 | Temp 98.2°F | Resp 18 | Ht 65.0 in | Wt 229.5 lb

## 2016-04-08 DIAGNOSIS — Z8051 Family history of malignant neoplasm of kidney: Secondary | ICD-10-CM | POA: Insufficient documentation

## 2016-04-08 DIAGNOSIS — M1990 Unspecified osteoarthritis, unspecified site: Secondary | ICD-10-CM | POA: Diagnosis not present

## 2016-04-08 DIAGNOSIS — Z853 Personal history of malignant neoplasm of breast: Secondary | ICD-10-CM | POA: Insufficient documentation

## 2016-04-08 DIAGNOSIS — Z8249 Family history of ischemic heart disease and other diseases of the circulatory system: Secondary | ICD-10-CM | POA: Diagnosis not present

## 2016-04-08 DIAGNOSIS — F329 Major depressive disorder, single episode, unspecified: Secondary | ICD-10-CM | POA: Diagnosis not present

## 2016-04-08 DIAGNOSIS — M797 Fibromyalgia: Secondary | ICD-10-CM | POA: Insufficient documentation

## 2016-04-08 DIAGNOSIS — K219 Gastro-esophageal reflux disease without esophagitis: Secondary | ICD-10-CM | POA: Insufficient documentation

## 2016-04-08 DIAGNOSIS — H3530 Unspecified macular degeneration: Secondary | ICD-10-CM | POA: Diagnosis not present

## 2016-04-08 DIAGNOSIS — J45909 Unspecified asthma, uncomplicated: Secondary | ICD-10-CM | POA: Diagnosis not present

## 2016-04-08 DIAGNOSIS — Z888 Allergy status to other drugs, medicaments and biological substances status: Secondary | ICD-10-CM | POA: Diagnosis not present

## 2016-04-08 DIAGNOSIS — Z820 Family history of epilepsy and other diseases of the nervous system: Secondary | ICD-10-CM | POA: Diagnosis not present

## 2016-04-08 DIAGNOSIS — E039 Hypothyroidism, unspecified: Secondary | ICD-10-CM | POA: Insufficient documentation

## 2016-04-08 DIAGNOSIS — Z86010 Personal history of colonic polyps: Secondary | ICD-10-CM | POA: Insufficient documentation

## 2016-04-08 DIAGNOSIS — Z811 Family history of alcohol abuse and dependence: Secondary | ICD-10-CM | POA: Insufficient documentation

## 2016-04-08 DIAGNOSIS — L280 Lichen simplex chronicus: Secondary | ICD-10-CM | POA: Insufficient documentation

## 2016-04-08 DIAGNOSIS — F419 Anxiety disorder, unspecified: Secondary | ICD-10-CM | POA: Insufficient documentation

## 2016-04-08 DIAGNOSIS — Z880 Allergy status to penicillin: Secondary | ICD-10-CM | POA: Insufficient documentation

## 2016-04-08 DIAGNOSIS — Z9889 Other specified postprocedural states: Secondary | ICD-10-CM | POA: Insufficient documentation

## 2016-04-08 DIAGNOSIS — Z801 Family history of malignant neoplasm of trachea, bronchus and lung: Secondary | ICD-10-CM | POA: Insufficient documentation

## 2016-04-08 DIAGNOSIS — Z79899 Other long term (current) drug therapy: Secondary | ICD-10-CM | POA: Insufficient documentation

## 2016-04-08 DIAGNOSIS — C4499 Other specified malignant neoplasm of skin, unspecified: Secondary | ICD-10-CM

## 2016-04-08 DIAGNOSIS — Z96653 Presence of artificial knee joint, bilateral: Secondary | ICD-10-CM | POA: Insufficient documentation

## 2016-04-08 DIAGNOSIS — N816 Rectocele: Secondary | ICD-10-CM | POA: Diagnosis not present

## 2016-04-08 DIAGNOSIS — C519 Malignant neoplasm of vulva, unspecified: Secondary | ICD-10-CM

## 2016-04-08 DIAGNOSIS — Z87891 Personal history of nicotine dependence: Secondary | ICD-10-CM | POA: Diagnosis not present

## 2016-04-08 NOTE — Progress Notes (Signed)
ASSESSMENT AND PLAN: [name] 71 y.o. 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 + margin at vagina and could not take more due to anatomy and rectocoele. Will follow clinically. Posteriorly there is some flap separation but the area is clear and the flap is viable. Did not examine anterior. Foley in place. Will see Dr. Lynnell Jude today and will return to see me in [place] on 11/14.  CC:  Chief Complaint  Patient presents with  . Follow-up   HISTORY: [name] is a 70 y.o. 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 Her treatment summary is as noted below:    Consult Note: Gyn-Onc  [name] 70 y.o. female  CC:  Chief Complaint  Patient presents with  . Paget's Disease of vulva    Follow up    HPI:  Patient is a 70 year old with a history of a partial simple vulvectomy in February of 2012 for vulvar Paget's disease. Some of the surgical margins were positive. Her postoperative course was uncomplicated. She was last seen by our service in 12/14. At that time there was no evidence of any lesions consistent with Paget's. However, there is considerable amount of excoriations throughout the entire vulvar and medial thighs consistent with chronic vulvitis.In December 2013 she had a hernia repair due to bowel obstruction by Dr. Excell Seltzer with permanent mesh. In May 2013 she also had a TVT bladder by Dr. Everett Graff. She does continue to wear a pad and also takes Detrol twice daily but states that her urinary leakage is markedly improved. She described the vulvar irritation is intermittent itching with burning and that it has increased over time as has the pruritus. She does use a steroid cream twice daily and does scratch her vulva.   She underwent a wide local excision of the left vulva on January 2 of year 2014. Operative findings included 2 separate 3 x 3 and 1.2 cm lesions consistent with Paget's on the left.  Pathology revealed 2  foci of extramammary Paget's disease extending to the left inked margin in the right and anterior tip margins at multiple foci. The posterior margin was negative for malignancy.   I performed a biopsy of this it was consistent with recurrent Paget's disease. On 11/23/2012 showed a repeat wide local excision of liver x2 with the posterior fourchette biopsy. Findings revealed along the left labia minora/vagina is a Paget's disease measuring approximately 2.5 x 1 cm in the superior aspect of her prior left vulvar excision. The inferior aspect of a similar lesion. 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 On the left perineal body the area is somewhat excoriated pale. Biopsy was performed in this area.  Pathology revealed:  1. Vulva, biopsy, left inferior - EXTRAMAMMARY PAGET DISEASE EXTENDING TO THE EDGES OF THE BIOPSY. 2. Vulva, excision, left superior lesion suture marks 1200 - EXTRAMAMMARY PAGET DISEASE. - MARGINS NOT INVOLVED. - CLOSEST MARGIN 9 O'CLOCK, LESS THAN 0.1 CM. 3. Vulva, excision, left inferior excision suture marks 1200 - EXTRAMAMMARY PAGET DISEASE, 12 O'CLOCK MARGIN FOCALLY INVOLVED. - REMAINING MARGINS CLEAR.  We have discussed repeating surgery however she had multiple orthopedic issues had broken ankle. She was seen by [name] in December 2014. Exam at that time was fairly unremarkable.  I had seen her in January of 2015. At that time exam was concerning for Paget's.   On 08/02/2013 she underwent wide local excisions of the vulva x2.  Operative findings  included pale pink raised lesion measuring 1 x 0.5 cm the left upper vulva near the prior excision site. There is a pale raised lesion measuring 1.5 x 1 cm on the left crossing midline perineal body.  Pathology revealed left superior vulvar excision with extramammary Paget's disease extending to the 9:00 margin. The other margins were negative for tumor. Of the left of midline lesion she  extramammary Paget's disease extending to the 12:00 margin and focally at the 6:00 margin but otherwise negative margins. The patient at that time discussed with [place] that she was having some right lower quadrant pain. Her exam is very difficult secondary to habitus. There ultrasound was obtained. Ultrasound revealed:  FINDINGS:  Uterus Measurements: 4.6 x 3.5 x 3.8 cm. 2.5 x 1.7 x 1.6 cm uterine fibroid. This is present in the right lateral aspect of the body uterus.  Endometrium Thickness: 6.1 mm. No focal abnormality visualized.  Right ovary: No focal abnormality.  Left ovary :No focal abnormality.  Other findings: No free fluid  IMPRESSION:  Uterine fibroid otherwise negative exam.  I saw her March 16, 2014 at which time there was an area on the left vulva with hyperkeratosis. A biopsy of that area was performed consistent with Paget's disease. On March 29, 2014 she underwent wide local excision of the vulva. On exam she had a 1 cm left vulvar lesion just below the left labia minora. She underwent a wide local excision. He received a 3.7 x 1.7 x 0.3 cm lesion. There was extramammary Paget's disease extending to the 6 to 9:00 margin in the 12:00 margin. This is despite grossly negative margins on physical examination.   She had an excision in November that revealed Diagnosis Vulva, excision, w/stitch @ 1200 - EXTRAMAMMARY PAGET'S DISEASE, EXTENDING TO THE 6-9 O'CLOCK MARGIN AND 12 O'CLOCK PERPENDICULAR MARGIN.  I saw her in February 2016 at which time there was concern for recurrent disease. She decided to proceed with Aldara therapy. She used for approximate 4 weeks and then decided to stop it secondary to the expense of the medication. She then started using the clobetasol. While she was using the Aldara all the pruritus in her vulva resolved itself. Since she stopped it and was using the steroid cream the pruritus returned.  I saw her in May 2016 at which time she has small area  consistent with Paget's on the right labia minora and a larger patch on the left. Biopsy on the right revealed extramammary Paget's. After discussion she wished to try the Aldara again. She called and requested an earlier appointment as for the last 2-3 weeks had increased itching and has been scratching so much that she's had some bleeding. I last saw her on July 6. At that time she had been having a lot of symptoms of pruritus and scratching a great deal. Vulvar examination at that time revealed a fairly macerated vulva and it was possible to discern what could be related to Paget's, effect of the Aldara therapy, trauma from scratching. She was asked to stop the Aldara therapy and use a low-dose daily cream. We asked her to refrain from scratching come in today for evaluation.  I saw her again December 26, 2104 at which time there was evidence clearly a Paget's disease and she scheduled for surgery. On January 24, 2015 she underwent bilateral wide local excisions of the vulva. On the right vulva there was a 2 cm patch of Paget's disease in the mid labia majora. On the left  side. The remaining labia minora in the area prior to the excision are smaller lesion consistent with Paget's disease.  Diagnosis 1. Vulva, excision, left wide excision - EXTRAMAMMARY PAGET'S DISEASE EXTENDING TO THE 12 O'CLOCK, 3 O'CLOCK, 6 O'CLOCK AND 9 O'CLOCK MARGINS. 2. Vulva, excision, right wide excision - EXTRAMAMMARY PAGET'S EXTENDING TO THE 3 O'CLOCK MARGIN AND THE 6 O'CLOCK POLE.  Please refer to the photograph in the operative note from Dr. Skeet Latch for complete mapping of her lesion. She still having a little bit of spotting from the biopsy sites and some pain.  The patient initially underwent partial simple vulvectomy in February 2012 for vulvar Paget's disease. Some of her surgical margins were positive. She had an uncomplicated post operative course. She had some evidence of vulvar irritation afterward with symptoms of  pruritus and burning that increased over time and for which she was using a steroid cream twice daily. She underwent wide local excision of the left vulva in January 2014. Findings at the time of surgery were to areas consistent with Paget's on the left, one measuring 3 x 3 cm and one measuring 1.2 cm pathology revealed 2 foci of extramammary Paget's disease extending to the left intact margin in the right and anterior tip margins at multiple foci. The patient was seen for follow-up with recurrent Paget's disease noted. In July 2014, she underwent repeat wide local excision of 2 areas in the posterior fourchette biopsy. Findings were an area along the left labium minora measuring 2.5 x 1 cm at the superior aspect of her prior left vulvar excision. There was a similar lesion at the inferior aspect. It appeared that the patient also had lichen sclerosis of the right vulva. Left perineal body biopsy was performed secondary to some noted excoriation. Pathology from the 2 excisions showed extramammary Paget's disease, margins on left superior lesion were negative, margin at 12:00 on inferior lesion focally involved. Perineal body biopsy positive for Paget's disease.   The patient then underwent several orthopedic surgeries and ultimately was taken back to the operating room in March 2015 for 2 wide local excisions of the vulva. Findings at that time included a pill raised pink lesion measuring 1 x 0.5 cm of the left upper vulva near the prior excision site and a second pill raised lesion measuring 1.5 x 1 cm on the left crossing midline perineal body. Pathology showed that the left superior vulvar excision was Paget's disease extending to the 9:00 margin. Other margins were negative. The left midline lesion also was Paget's disease extending to the 12:00 margin and focally to the 6:00 margin. Patient was then seen back in October 2015 at which time there was an area on the left vulva with hyperkeratosis. A biopsy of  that area confirmed Paget's disease. Patient underwent repeat wide local excision of the vulva on March 29, 2014. Findings were a 1 cm left vulvar lesion just below the left labium minora. Pathology showed Paget's disease extending to the 6, 9, and 12:00 margins. This was despite grossly negative margins on physical exam.  Patient was next seen in February 2016 at which time there was concern for recurrent disease. She decided to proceed with Aldara therapy which she uses for 4 weeks and then stopped it secondary to the expense of the medication. Started using clobetasol. Her pruritus stopped with Aldara, but unfortunately returned when she switched to clobetasol. In May 2016 she was seen and had an area that was smaller still consistent with Paget's disease on  the right labia minora and a larger patch on the left. Biopsy on the right revealed extramammary Paget's. She opted to try Aldara again. Given evidence of recurrent disease she underwent bilateral wide local excision of the vulva at the end of August 2016. Both wide local excision specimens were positive for Paget's disease with extension to focal margins on each.  Most recently, the patient was taken to the operating room on 01/31/2016 for preoperative mapping of her lesion for her planned wide local excision of the left vulva and smaller excision on the right. Operative findings at that time were notable for surgically absent inferior aspect of the left labium minora and majora. Multiple areas with white flaky skin changes were biopsied and the abnormal-appearing tissue. Changes noted in the area of the clitoris and perineum and at the introitus just distal to the hymen. Biopsies were collected on all of the sites.   INTERVAL HISTORY: Findings: 1. Surgical absent left labia majorum inferiorly from prior vulvectomies.  2. Focusing on the left vulva, there was gross disease incorporating the prior surgical site, characterized by patchy white and  erythematous epithelium. The affected area spanned from the perineal body to 5-6cm laterally. Prior mapping biopsy sites x3 were well healed and visible and surrounded by what appeared to be grossly normal appearing skin. Superiorly on the left labia majorum, there was affected epithelium with a similar appearing patchy white, erythematous epithelium.  3. Focusing of the right vulva, there was less extensive disease. Affected area extended from the perineal body to roughly 3cm laterally with minimal involvement of the right labia majorum.  4. The urethra, anus, and clitoris were spared.  5. A rectal exam was performed during the procedure to confirm protection of the sphincter, which was intact at the conclusion of our case.  6. A rectocele was notable 3cm into the vagina.   Pathology: Final Diagnosis  A: Vulva, right and left, vulvectomy - (Recurrent/persisent) Paget disease  - Disease size approximately 8.3 x 4.6 cm  - Disease confined to the epidermis - Multiple epidermal margins are positive for disease: 6 o'clock to 11 o'clock (per original orientation)  - Deep margin free of involvement   Stage/Disposition: [name] is a 70 y.o. woman with recurrent extramammary Paget's disease.   She was seen by plastic surgery on October 9 with the desire for them to keep the Foley in for an additional 3 weeks. She comes in today with a plan for Foley catheter removal.  Current Meds:  Outpatient Encounter Prescriptions as of 04/08/2016  Medication Sig Dispense Refill  . beta carotene w/minerals (OCUVITE) tablet Take 2 tablets by mouth daily.     Marland Kitchen buPROPion (WELLBUTRIN XL) 150 MG 24 hr tablet Take 150 mg by mouth every morning.     . Calcium Carbonate-Vitamin D 600-125 MG-UNIT TABS Take 1 tablet by mouth daily.     . Cholecalciferol (VITAMIN D3) 1000 UNITS CAPS Take 2 capsules by mouth daily.    . citalopram (CELEXA) 40 MG tablet Take 40 mg by mouth every morning.     .  clotrimazole-betamethasone (LOTRISONE) cream Apply topically 2 (two) times daily. 45 g 1  . docusate sodium (COLACE) 100 MG capsule Take 300 mg by mouth daily.     Marland Kitchen levothyroxine (SYNTHROID, LEVOTHROID) 175 MCG tablet TAKE 1 TABLET (175 MCG TOTAL) BY MOUTH DAILY.--  TAKING NAME BRAND SYNTHROID--  TAKES IN AM  2  . montelukast (SINGULAIR) 10 MG tablet Take 10 mg by mouth  daily as needed.     . Multiple Vitamins-Minerals (MULTIVITAMIN GUMMIES ADULT) CHEW Chew 2 tablets by mouth daily.    . Omega-3 1000 MG CAPS Take 1 g by mouth daily.     Marland Kitchen oxyCODONE (OXY IR/ROXICODONE) 5 MG immediate release tablet Take 1-2 tablets (5-10 mg total) by mouth every 4 (four) hours as needed for severe pain. 60 tablet 0  . pantoprazole (PROTONIX) 40 MG tablet Take 40 mg by mouth every morning.     . polyvinyl alcohol (LIQUIFILM TEARS) 1.4 % ophthalmic solution Place 1 drop into both eyes as needed for dry eyes.    Marland Kitchen spironolactone (ALDACTONE) 25 MG tablet Take 0.5-1 tablets (12.5-25 mg total) by mouth as needed (for swelling). 30 tablet 0  . albuterol (PROVENTIL HFA;VENTOLIN HFA) 108 (90 BASE) MCG/ACT inhaler Inhale 2 puffs into the lungs every 4 (four) hours as needed for wheezing or shortness of breath. Reported on 08/01/2015    . albuterol (PROVENTIL) (2.5 MG/3ML) 0.083% nebulizer solution Take 2.5 mg by nebulization every 6 (six) hours as needed for wheezing or shortness of breath. Reported on 08/01/2015    . traMADol (ULTRAM) 50 MG tablet Take 1 tablet (50 mg total) by mouth every 6 (six) hours as needed. (Patient not taking: Reported on 04/08/2016) 30 tablet 0   No facility-administered encounter medications on file as of 04/08/2016.     Allergy:  Allergies  Allergen Reactions  . Codeine Other (See Comments)    Hyper/ Hallucinations   . Demerol [Meperidine] Other (See Comments)    "Knocks me out"  . Diazepam Other (See Comments)    Extreme sedation; patient does not recall taking diazepam (12/07/14)  . Tape  Hives and Other (See Comments)    "thick clear plastic tape" causes blisters  . Morphine And Related Itching and Rash    Per CRNA, pt tol Fentanyl IV for OR case, no issues reported to rec RN in PACU, no immediate PACU issues noted as well upon arrival  . Penicillins Hives and Swelling  . Valisone [Betamethasone] Rash    Social Hx:   Social History   Social History  . Marital status: Married    Spouse name: N/A  . Number of children: N/A  . Years of education: N/A   Occupational History  . Not on file.   Social History Main Topics  . Smoking status: Former Smoker    Packs/day: 2.00    Years: 41.00    Types: Cigarettes    Quit date: 06/05/1999  . Smokeless tobacco: Never Used  . Alcohol use No  . Drug use: No  . Sexual activity: Not on file   Other Topics Concern  . Not on file   Social History Narrative  . No narrative on file    Past Surgical Hx:  Past Surgical History:  Procedure Laterality Date  . BLADDER SUSPENSION  10/13/2011   Procedure: TRANSVAGINAL TAPE (TVT) PROCEDURE;  Surgeon: Delice Lesch, MD;  Location: [facility];  Service: Gynecology;  Laterality: N/A;  . BREAST LUMPECTOMY Left 03-27-2004  . CARDIAC CATHETERIZATION  10-30-2000  dr Wynonia Lawman   normal coronary arteries and LVF/  ef 65-70%  . CLOSED MANIPULATION  W/ OPEN REDUCTION EXCHANGE TIBIAL FEMORAL BEARING POST TOTAL LEFT KNEE  04-14-2001  . CYSTOSCOPY  10/13/2011   Procedure: CYSTOSCOPY;  Surgeon: Delice Lesch, MD;  Location: [facility];  Service: Gynecology;  Laterality: Bilateral;  . DILATION AND CURETTAGE OF UTERUS  1968  . FOOT SURGERY  Left 02-01-2009   TRIPLE ARTHRODESIS  . FOOT SURGERY Left 11/14   I&D left foot with woundvac placement  . INCISIONAL HERNIA REPAIR  05/05/2011   Procedure: LAPAROSCOPIC INCISIONAL HERNIA;  Surgeon: Edward Jolly, MD;  Location: [facility];  Service: General;  Laterality: N/A;  LAPAROSCOPIC REPAIR INCARCERATED INCISIONAL HERNIA  WITH MESH  . KNEE ARTHROSCOPY  Bilateral left [id number] & 04-1992/   right [phone]  . LAPAROSCOPIC CHOLECYSTECTOMY  06-21-2010  . LUMBAR SPINE SURGERY  1998   L5 -- S1  . MASTECTOMY Right 1997   W/  LYMPH NODE DISSECTION AND RECONSTRUCTION WITH IMPLANTS AND EXPANDER  . NEGATIVE SLEEP STUDY  12/26/2015  . PORT-A-CATH PLACEMENT AND REMOVAL  1997  . REMOVAL RIGHT BREAST IMPLANT AND CAPSULECTOMY  06-03-1999  . REVISION TOTAL KNEE ARTHROPLASTY Left 07-07-2005  &  12-10-2001   12-10-2001  REMOVAL TOTAL KNEE/ I&D / INSERTION ANTIBIOTIC SPACER  . REVISION TOTAL KNEE ARTHROPLASTY Right 07-07-2005  . ROTATOR CUFF REPAIR Right   . TENDON REPAIR RIGHT RING FINGER  2011  . THUMB TRIGGER RELEASE Right 1993  . THYROIDECTOMY, PARTIAL  1983  . TONSILLECTOMY  1968  . TOTAL KNEE ARTHROPLASTY Bilateral right 05-04-2000/  left 03-29-2001  . VULVECTOMY  05/27/2012   Procedure: WIDE EXCISION VULVECTOMY;  Surgeon: Imagene Gurney A. Alycia Rossetti, MD;  Location: [facility];  Service: Gynecology;  Laterality: N/A;  Wide Local Excision of Vulva   . VULVECTOMY N/A 11/23/2012   Procedure: WIDE LOCAL EXCISION OF THE VULVA;  Surgeon: Imagene Gurney A. Alycia Rossetti, MD;  Location: [facility];  Service: Gynecology;  Laterality: N/A;  left inferior vulvar biopsy excision left superior lesion left inferior vulvar excision  . VULVECTOMY N/A 08/02/2013   Procedure: WIDE LOCAL  EXCISION VULVA;  Surgeon: Imagene Gurney A. Alycia Rossetti, MD;  Location: [facility];  Service: Gynecology;  Laterality: N/A;  . VULVECTOMY Left 03/29/2014   Procedure: WIDE LOCAL EXCISION OF LEFT VULVA ;  Surgeon: Imagene Gurney A. Alycia Rossetti, MD;  Location: [facility];  Service: Gynecology;  Laterality: Left;  Marland Kitchen VULVECTOMY Bilateral 01/24/2015   Procedure: WIDE LOCAL EXCISION VULVA;  Surgeon: [name], MD;  Location: [facility];  Service: Gynecology;  Laterality: Bilateral;  . VULVECTOMY PARTIAL  07-16-2010    Past Medical Hx:  Past Medical History:  Diagnosis Date  . Anxiety   . Arthritis   . Asthma   . Chronic  constipation   . Depression   . Fibromyalgia   . GERD (gastroesophageal reflux disease)   . 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 History of colon polyps    2007 hyperplagia  . History of esophageal dilatation    2008  . History of peptic ulcer   . History of small bowel obstruction    2012  . Hypothyroidism   . Macular degeneration of right eye   . Numbness of left foot    4 toes  . Paget's disease of vulva   . PONV (postoperative nausea and vomiting)    and HARD TO WAKE  . Seasonal allergies   . SUI (stress urinary incontinence, female)   . Wears glasses     Family Hx:  Family History  Problem Relation Age of Onset  . Cancer Father 27    lung and kidney   . Alzheimer's disease Mother   .  Heart attack Maternal Grandmother   . Alcohol abuse Brother     Vitals:  Blood pressure (!) 108/53, pulse 83, temperature 98.2 F (36.8 C), temperature source Oral, resp. rate 18, height 5\' 5"  (1.651 m), weight 229 lb 8 oz (104.1 kg), SpO2 97 %.  Physical Exam: Well-nourished well-developed female in no acute distress.  Several sutures are still in place and were quite loose. Suture material from the right vulva was removed without difficulty. Suture on the left side from the donor site was removed. There continues to be an area of somewhat macerated skin inside the vagina that is quite moist. I don't think that keeping the Foley catheter in any longer would make a difference regarding this as it is moist even with Foley catheter in place. The Foley was removed without difficulty. The area is clean with clean granulation tissue. There is no exudate. There is excellent take of the graft.  Assessment/Plan: 71 year old with recurrent vulvar Paget's disease status post definitive surgery with wide local excision and flaps. She did have a  positive margin in the vagina however more tissue could not be taken from that area secondary to her rectocele and the vaginal margin. We'll follow this expectantly after she heals. She has a follow-up with Dr. Limmie Patricia on December 13 and will return to see me on January 17.  [name] A., MD 04/08/2016, 9:52 AM

## 2016-04-08 NOTE — Patient Instructions (Signed)
Plan to follow up with Dr. Lynnell Jude in December and Dr. Alycia Rossetti on June 11, 2016 or sooner if needed.  Please call for any questions or concerns.

## 2016-05-23 ENCOUNTER — Encounter: Payer: Self-pay | Admitting: Podiatry

## 2016-05-23 ENCOUNTER — Ambulatory Visit (INDEPENDENT_AMBULATORY_CARE_PROVIDER_SITE_OTHER): Payer: Medicare Other | Admitting: Podiatry

## 2016-05-23 DIAGNOSIS — L03032 Cellulitis of left toe: Secondary | ICD-10-CM

## 2016-05-23 DIAGNOSIS — M79675 Pain in left toe(s): Secondary | ICD-10-CM | POA: Diagnosis not present

## 2016-05-23 MED ORDER — SILVER SULFADIAZINE 1 % EX CREA: 1.0000 "application " | TOPICAL_CREAM | Freq: Every day | CUTANEOUS | 0 refills | Status: DC

## 2016-05-23 MED ORDER — AZITHROMYCIN 250 MG PO TABS
ORAL_TABLET | ORAL | 0 refills | Status: DC
Start: 2016-05-23 — End: 2016-07-23
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
## 2016-06-05 ENCOUNTER — Encounter: Payer: Self-pay | Admitting: Gynecologic Oncology

## 2016-06-11 ENCOUNTER — Ambulatory Visit: Payer: Medicare Other | Admitting: Gynecologic Oncology

## 2016-06-12 ENCOUNTER — Ambulatory Visit: Payer: Medicare Other | Admitting: Podiatry

## 2016-06-18 ENCOUNTER — Ambulatory Visit: Payer: Medicare Other | Admitting: Podiatry

## 2016-07-22 NOTE — Progress Notes (Signed)
ASSESSMENT AND PLAN: [name] 71 y.o. 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 + margin at vagina and could not take more due to anatomy and rectocoele. Will follow clinically. Posteriorly there is some flap separation but the area is clear and the flap is viable. Did not examine anterior. Foley in place. Will see Dr. Lynnell Jude today and will return to see me in [place] on 11/14.  CC:  Chief Complaint  Patient presents with  . Follow-up   HISTORY: [name] is a 71 y.o. 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 Her treatment summary is as noted below:    Consult Note: Gyn-Onc  [name] 71 y.o. female  CC:  Chief Complaint  Patient presents with  . Extramammary Paget's Disease    HPI:  Patient is a 71 year old with a history of a partial simple vulvectomy in February of 2012 for vulvar Paget's disease. Some of the surgical margins were positive. Her postoperative course was uncomplicated. She was last seen by our service in 12/14. At that time there was no evidence of any lesions consistent with Paget's. However, there is considerable amount of excoriations throughout the entire vulvar and medial thighs consistent with chronic vulvitis.In December 2013 she had a hernia repair due to bowel obstruction by Dr. Excell Seltzer with permanent mesh. In May 2013 she also had a TVT bladder by Dr. Everett Graff. She does continue to wear a pad and also takes Detrol twice daily but states that her urinary leakage is markedly improved. She described the vulvar irritation is intermittent itching with burning and that it has increased over time as has the pruritus. She does use a steroid cream twice daily and does scratch her vulva.   She underwent a wide local excision of the left vulva on January 2 of year 2014. Operative findings included 2 separate 3 x 3 and 1.2 cm lesions consistent with Paget's on the left.  Pathology revealed 2 foci of  extramammary Paget's disease extending to the left inked margin in the right and anterior tip margins at multiple foci. The posterior margin was negative for malignancy.   I performed a biopsy of this it was consistent with recurrent Paget's disease. On 11/23/2012 showed a repeat wide local excision of liver x2 with the posterior fourchette biopsy. Findings revealed along the left labia minora/vagina is a Paget's disease measuring approximately 2.5 x 1 cm in the superior aspect of her prior left vulvar excision. The inferior aspect of a similar lesion. 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 On the left perineal body the area is somewhat excoriated pale. Biopsy was performed in this area.  Pathology revealed:  1. Vulva, biopsy, left inferior - EXTRAMAMMARY PAGET DISEASE EXTENDING TO THE EDGES OF THE BIOPSY. 2. Vulva, excision, left superior lesion suture marks 1200 - EXTRAMAMMARY PAGET DISEASE. - MARGINS NOT INVOLVED. - CLOSEST MARGIN 9 O'CLOCK, LESS THAN 0.1 CM. 3. Vulva, excision, left inferior excision suture marks 1200 - EXTRAMAMMARY PAGET DISEASE, 12 O'CLOCK MARGIN FOCALLY INVOLVED. - REMAINING MARGINS CLEAR.  We have discussed repeating surgery however she had multiple orthopedic issues had broken ankle. She was seen by [name] in December 2014. Exam at that time was fairly unremarkable.  I had seen her in January of 2015. At that time exam was concerning for Paget's.   On 08/02/2013 she underwent wide local excisions of the vulva x2.  Operative findings included pale pink raised lesion measuring  1 x 0.5 cm the left upper vulva near the prior excision site. There is a pale raised lesion measuring 1.5 x 1 cm on the left crossing midline perineal body.  Pathology revealed left superior vulvar excision with extramammary Paget's disease extending to the 9:00 margin. The other margins were negative for tumor. Of the left of midline lesion she extramammary  Paget's disease extending to the 12:00 margin and focally at the 6:00 margin but otherwise negative margins. The patient at that time discussed with [place] that she was having some right lower quadrant pain. Her exam is very difficult secondary to habitus. There ultrasound was obtained. Ultrasound revealed:  FINDINGS:  Uterus Measurements: 4.6 x 3.5 x 3.8 cm. 2.5 x 1.7 x 1.6 cm uterine fibroid. This is present in the right lateral aspect of the body uterus.  Endometrium Thickness: 6.1 mm. No focal abnormality visualized.  Right ovary: No focal abnormality.  Left ovary :No focal abnormality.  Other findings: No free fluid  IMPRESSION:  Uterine fibroid otherwise negative exam.  I saw her March 16, 2014 at which time there was an area on the left vulva with hyperkeratosis. A biopsy of that area was performed consistent with Paget's disease. On March 29, 2014 she underwent wide local excision of the vulva. On exam she had a 1 cm left vulvar lesion just below the left labia minora. She underwent a wide local excision. He received a 3.7 x 1.7 x 0.3 cm lesion. There was extramammary Paget's disease extending to the 6 to 9:00 margin in the 12:00 margin. This is despite grossly negative margins on physical examination.   She had an excision in November that revealed Diagnosis Vulva, excision, w/stitch @ 1200 - EXTRAMAMMARY PAGET'S DISEASE, EXTENDING TO THE 6-9 O'CLOCK MARGIN AND 12 O'CLOCK PERPENDICULAR MARGIN.  I saw her in February 2016 at which time there was concern for recurrent disease. She decided to proceed with Aldara therapy. She used for approximate 4 weeks and then decided to stop it secondary to the expense of the medication. She then started using the clobetasol. While she was using the Aldara all the pruritus in her vulva resolved itself. Since she stopped it and was using the steroid cream the pruritus returned.  I saw her in May 2016 at which time she has small area consistent with  Paget's on the right labia minora and a larger patch on the left. Biopsy on the right revealed extramammary Paget's. After discussion she wished to try the Aldara again. She called and requested an earlier appointment as for the last 2-3 weeks had increased itching and has been scratching so much that she's had some bleeding. I last saw her on July 6. At that time she had been having a lot of symptoms of pruritus and scratching a great deal. Vulvar examination at that time revealed a fairly macerated vulva and it was possible to discern what could be related to Paget's, effect of the Aldara therapy, trauma from scratching. She was asked to stop the Aldara therapy and use a low-dose daily cream. We asked her to refrain from scratching come in today for evaluation.  I saw her again December 26, 2104 at which time there was evidence clearly a Paget's disease and she scheduled for surgery. On January 24, 2015 she underwent bilateral wide local excisions of the vulva. On the right vulva there was a 2 cm patch of Paget's disease in the mid labia majora. On the left side. The remaining labia minora in  the area prior to the excision are smaller lesion consistent with Paget's disease.  Diagnosis 1. Vulva, excision, left wide excision - EXTRAMAMMARY PAGET'S DISEASE EXTENDING TO THE 12 O'CLOCK, 3 O'CLOCK, 6 O'CLOCK AND 9 O'CLOCK MARGINS. 2. Vulva, excision, right wide excision - EXTRAMAMMARY PAGET'S EXTENDING TO THE 3 O'CLOCK MARGIN AND THE 6 O'CLOCK POLE.  Please refer to the photograph in the operative note from Dr. Skeet Latch for complete mapping of her lesion. She still having a little bit of spotting from the biopsy sites and some pain.  The patient initially underwent partial simple vulvectomy in February 2012 for vulvar Paget's disease. Some of her surgical margins were positive. She had an uncomplicated post operative course. She had some evidence of vulvar irritation afterward with symptoms of pruritus and  burning that increased over time and for which she was using a steroid cream twice daily. She underwent wide local excision of the left vulva in January 2014. Findings at the time of surgery were to areas consistent with Paget's on the left, one measuring 3 x 3 cm and one measuring 1.2 cm pathology revealed 2 foci of extramammary Paget's disease extending to the left intact margin in the right and anterior tip margins at multiple foci. The patient was seen for follow-up with recurrent Paget's disease noted. In July 2014, she underwent repeat wide local excision of 2 areas in the posterior fourchette biopsy. Findings were an area along the left labium minora measuring 2.5 x 1 cm at the superior aspect of her prior left vulvar excision. There was a similar lesion at the inferior aspect. It appeared that the patient also had lichen sclerosis of the right vulva. Left perineal body biopsy was performed secondary to some noted excoriation. Pathology from the 2 excisions showed extramammary Paget's disease, margins on left superior lesion were negative, margin at 12:00 on inferior lesion focally involved. Perineal body biopsy positive for Paget's disease.   The patient then underwent several orthopedic surgeries and ultimately was taken back to the operating room in March 2015 for 2 wide local excisions of the vulva. Findings at that time included a pill raised pink lesion measuring 1 x 0.5 cm of the left upper vulva near the prior excision site and a second pill raised lesion measuring 1.5 x 1 cm on the left crossing midline perineal body. Pathology showed that the left superior vulvar excision was Paget's disease extending to the 9:00 margin. Other margins were negative. The left midline lesion also was Paget's disease extending to the 12:00 margin and focally to the 6:00 margin. Patient was then seen back in October 2015 at which time there was an area on the left vulva with hyperkeratosis. A biopsy of that area  confirmed Paget's disease. Patient underwent repeat wide local excision of the vulva on March 29, 2014. Findings were a 1 cm left vulvar lesion just below the left labium minora. Pathology showed Paget's disease extending to the 6, 9, and 12:00 margins. This was despite grossly negative margins on physical exam.  Patient was next seen in February 2016 at which time there was concern for recurrent disease. She decided to proceed with Aldara therapy which she uses for 4 weeks and then stopped it secondary to the expense of the medication. Started using clobetasol. Her pruritus stopped with Aldara, but unfortunately returned when she switched to clobetasol. In May 2016 she was seen and had an area that was smaller still consistent with Paget's disease on the right labia minora and a  larger patch on the left. Biopsy on the right revealed extramammary Paget's. She opted to try Aldara again. Given evidence of recurrent disease she underwent bilateral wide local excision of the vulva at the end of August 2016. Both wide local excision specimens were positive for Paget's disease with extension to focal margins on each.  Most recently, the patient was taken to the operating room on 01/31/2016 for preoperative mapping of her lesion for her planned wide local excision of the left vulva and smaller excision on the right. Operative findings at that time were notable for surgically absent inferior aspect of the left labium minora and majora. Multiple areas with white flaky skin changes were biopsied and the abnormal-appearing tissue. Changes noted in the area of the clitoris and perineum and at the introitus just distal to the hymen. Biopsies were collected on all of the sites.   INTERVAL HISTORY: Findings: 1. Surgical absent left labia majorum inferiorly from prior vulvectomies.  2. Focusing on the left vulva, there was gross disease incorporating the prior surgical site, characterized by patchy white and erythematous  epithelium. The affected area spanned from the perineal body to 5-6cm laterally. Prior mapping biopsy sites x3 were well healed and visible and surrounded by what appeared to be grossly normal appearing skin. Superiorly on the left labia majorum, there was affected epithelium with a similar appearing patchy white, erythematous epithelium.  3. Focusing of the right vulva, there was less extensive disease. Affected area extended from the perineal body to roughly 3cm laterally with minimal involvement of the right labia majorum.  4. The urethra, anus, and clitoris were spared.  5. A rectal exam was performed during the procedure to confirm protection of the sphincter, which was intact at the conclusion of our case.  6. A rectocele was notable 3cm into the vagina.   Pathology: Final Diagnosis  A: Vulva, right and left, vulvectomy - (Recurrent/persisent) Paget disease  - Disease size approximately 8.3 x 4.6 cm  - Disease confined to the epidermis - Multiple epidermal margins are positive for disease: 6 o'clock to 11 o'clock (per original orientation)  - Deep margin free of involvement   Stage/Disposition: [name] is a 71 y.o. woman with recurrent extramammary Paget's disease.   I last saw her 11/17. She comes in today for follow-up. She does feel like her rectum is very tight and she states this because when she needs to have a bowel movement she does not feel that she has the ability to get enough pressure to pass a stool through the anus. She denies any significant constipation or bleeding. She did have a "boil on her left leg that is now resolved and she would like [place] to evaluate that. She is completing a vulvar itching. No bleeding. She does have significant yeast under her pannus. She continues to have urinary incontinence and would like to have a referral for urogynecology at [facility]. I did place as previously but she was not contacted I'll place a referral again.  Current Meds:   Outpatient Encounter Prescriptions as of 07/23/2016  Medication Sig Dispense Refill  . albuterol (PROVENTIL HFA;VENTOLIN HFA) 108 (90 BASE) MCG/ACT inhaler Inhale 2 puffs into the lungs every 4 (four) hours as needed for wheezing or shortness of breath. Reported on 08/01/2015    . albuterol (PROVENTIL) (2.5 MG/3ML) 0.083% nebulizer solution Take 2.5 mg by nebulization every 6 (six) hours as needed for wheezing or shortness of breath. Reported on 08/01/2015    . beta carotene  w/minerals (OCUVITE) tablet Take 2 tablets by mouth daily.     Marland Kitchen buPROPion (WELLBUTRIN XL) 150 MG 24 hr tablet Take 150 mg by mouth every morning.     . Calcium Carbonate-Vitamin D 600-125 MG-UNIT TABS Take 1 tablet by mouth daily.     . Cholecalciferol (VITAMIN D3) 1000 UNITS CAPS Take 2 capsules by mouth daily.    . citalopram (CELEXA) 40 MG tablet Take 40 mg by mouth every morning.     . clotrimazole-betamethasone (LOTRISONE) cream Apply topically 2 (two) times daily. 45 g 1  . docusate sodium (COLACE) 100 MG capsule Take 300 mg by mouth daily.     Marland Kitchen levothyroxine (SYNTHROID, LEVOTHROID) 175 MCG tablet TAKE 1 TABLET (175 MCG TOTAL) BY MOUTH DAILY.--  TAKING NAME BRAND SYNTHROID--  TAKES IN AM  2  . montelukast (SINGULAIR) 10 MG tablet Take 10 mg by mouth daily as needed.     . Multiple Vitamins-Minerals (MULTIVITAMIN GUMMIES ADULT) CHEW Chew 2 tablets by mouth daily.    . Omega-3 1000 MG CAPS Take 1 g by mouth daily.     . pantoprazole (PROTONIX) 40 MG tablet Take 40 mg by mouth every morning.     . polyvinyl alcohol (LIQUIFILM TEARS) 1.4 % ophthalmic solution Place 1 drop into both eyes as needed for dry eyes.    . silver sulfADIAZINE (SILVADENE) 1 % cream Apply 1 application topically daily. 50 g 0  . spironolactone (ALDACTONE) 25 MG tablet Take 0.5-1 tablets (12.5-25 mg total) by mouth as needed (for swelling). 30 tablet 0  . traMADol (ULTRAM) 50 MG tablet Take 1 tablet (50 mg total) by mouth every 6 (six) hours as needed.  30 tablet 0  . oxyCODONE (OXY IR/ROXICODONE) 5 MG immediate release tablet Take 1-2 tablets (5-10 mg total) by mouth every 4 (four) hours as needed for severe pain. (Patient not taking: Reported on 07/23/2016) 60 tablet 0  . [DISCONTINUED] azithromycin (ZITHROMAX) 250 MG tablet Take as directed 6 tablet 0   No facility-administered encounter medications on file as of 07/23/2016.     Allergy:  Allergies  Allergen Reactions  . Codeine Other (See Comments)    Hyper/ Hallucinations   . Demerol [Meperidine] Other (See Comments)    "Knocks me out"  . Diazepam Other (See Comments)    Extreme sedation; patient does not recall taking diazepam (12/07/14)  . Tape Hives and Other (See Comments)    "thick clear plastic tape" causes blisters  . Morphine And Related Itching and Rash    Per CRNA, pt tol Fentanyl IV for OR case, no issues reported to rec RN in PACU, no immediate PACU issues noted as well upon arrival  . Penicillins Hives and Swelling  . Valisone [Betamethasone] Rash    Social Hx:   Social History   Social History  . Marital status: Married    Spouse name: N/A  . Number of children: N/A  . Years of education: N/A   Occupational History  . Not on file.   Social History Main Topics  . Smoking status: Former Smoker    Packs/day: 2.00    Years: 41.00    Types: Cigarettes    Quit date: 06/05/1999  . Smokeless tobacco: Never Used  . Alcohol use No  . Drug use: No  . Sexual activity: Not on file   Other Topics Concern  . Not on file   Social History Narrative  . No narrative on file    Past Surgical Hx:  Past Surgical History:  Procedure Laterality Date  . BLADDER SUSPENSION  10/13/2011   Procedure: TRANSVAGINAL TAPE (TVT) PROCEDURE;  Surgeon: Delice Lesch, MD;  Location: [facility];  Service: Gynecology;  Laterality: N/A;  . BREAST LUMPECTOMY Left 03-27-2004  . CARDIAC CATHETERIZATION  10-30-2000  dr Wynonia Lawman   normal coronary arteries and LVF/  ef 65-70%  . CLOSED  MANIPULATION  W/ OPEN REDUCTION EXCHANGE TIBIAL FEMORAL BEARING POST TOTAL LEFT KNEE  04-14-2001  . CYSTOSCOPY  10/13/2011   Procedure: CYSTOSCOPY;  Surgeon: Delice Lesch, MD;  Location: [facility];  Service: Gynecology;  Laterality: Bilateral;  . DILATION AND CURETTAGE OF UTERUS  1968  . FOOT SURGERY Left 02-01-2009   TRIPLE ARTHRODESIS  . FOOT SURGERY Left 11/14   I&D left foot with woundvac placement  . INCISIONAL HERNIA REPAIR  05/05/2011   Procedure: LAPAROSCOPIC INCISIONAL HERNIA;  Surgeon: Edward Jolly, MD;  Location: [facility];  Service: General;  Laterality: N/A;  LAPAROSCOPIC REPAIR INCARCERATED INCISIONAL HERNIA  WITH MESH  . KNEE ARTHROSCOPY Bilateral left [id number] & 04-1992/   right [phone]  . LAPAROSCOPIC CHOLECYSTECTOMY  06-21-2010  . LUMBAR SPINE SURGERY  1998   L5 -- S1  . MASTECTOMY Right 1997   W/  LYMPH NODE DISSECTION AND RECONSTRUCTION WITH IMPLANTS AND EXPANDER  . NEGATIVE SLEEP STUDY  12/26/2015  . PORT-A-CATH PLACEMENT AND REMOVAL  1997  . REMOVAL RIGHT BREAST IMPLANT AND CAPSULECTOMY  06-03-1999  . REVISION TOTAL KNEE ARTHROPLASTY Left 07-07-2005  &  12-10-2001   12-10-2001  REMOVAL TOTAL KNEE/ I&D / INSERTION ANTIBIOTIC SPACER  . REVISION TOTAL KNEE ARTHROPLASTY Right 07-07-2005  . ROTATOR CUFF REPAIR Right   . TENDON REPAIR RIGHT RING FINGER  2011  . THUMB TRIGGER RELEASE Right 1993  . THYROIDECTOMY, PARTIAL  1983  . TONSILLECTOMY  1968  . TOTAL KNEE ARTHROPLASTY Bilateral right 05-04-2000/  left 03-29-2001  . VULVECTOMY  05/27/2012   Procedure: WIDE EXCISION VULVECTOMY;  Surgeon: Imagene Gurney A. Alycia Rossetti, MD;  Location: [facility];  Service: Gynecology;  Laterality: N/A;  Wide Local Excision of Vulva   . VULVECTOMY N/A 11/23/2012   Procedure: WIDE LOCAL EXCISION OF THE VULVA;  Surgeon: Imagene Gurney A. Alycia Rossetti, MD;  Location: [facility];  Service: Gynecology;  Laterality: N/A;  left inferior vulvar biopsy excision left superior lesion left inferior vulvar excision  . VULVECTOMY N/A  08/02/2013   Procedure: WIDE LOCAL  EXCISION VULVA;  Surgeon: Imagene Gurney A. Alycia Rossetti, MD;  Location: [facility];  Service: Gynecology;  Laterality: N/A;  . VULVECTOMY Left 03/29/2014   Procedure: WIDE LOCAL EXCISION OF LEFT VULVA ;  Surgeon: Imagene Gurney A. Alycia Rossetti, MD;  Location[facility];  Service: Gynecology;  Laterality: Left;  Marland Kitchen VULVECTOMY Bilateral 01/24/2015   Procedure: WIDE LOCAL EXCISION VULVA;  Surgeon: [name], MD;  Location: [place];  Service: Gynecology;  Laterality: Bilateral;  . VULVECTOMY PARTIAL  07-16-2010    Past Medical Hx:  Past Medical History:  Diagnosis Date  . Anxiety   . Arthritis   . Asthma   . Chronic constipation   . Depression   . Fibromyalgia   . GERD (gastroesophageal reflux disease)   . 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 History of colon polyps    2007 hyperplagia  . History of esophageal  dilatation    2008  . History of peptic ulcer   . History of small bowel obstruction    2012  . Hypothyroidism   . Macular degeneration of right eye   . Numbness of left foot    4 toes  . Paget's disease of vulva   . PONV (postoperative nausea and vomiting)    and HARD TO WAKE  . Seasonal allergies   . SUI (stress urinary incontinence, female)   . Wears glasses     Family Hx:  Family History  Problem Relation Age of Onset  . Cancer Father 19    lung and kidney   . Alzheimer's disease Mother   . Heart attack Maternal Grandmother   . Alcohol abuse Brother     Vitals:  Blood pressure (!) 168/71, pulse 70, temperature 97.8 F (36.6 C), temperature source Oral, resp. rate 18, height 5\' 5"  (1.651 m), weight 216 lb 3.2 oz (98.1 kg), SpO2 98 %.  Physical Exam: Well-nourished well-developed female in no acute distress.  Abdomen: Soft, nontender, nondistended. Significant candidate  under the pannus across the entire aspect of the pannus but the right side is worse than the left.  Extremities: Marked orthopedic changes with the formation of her left foot. Well-healed surgical incisions from her donor site on the left leg. There was a small area that appeared to have been somewhat inflamed but that is resolved and the incision is well-healed. The vulva is notable for surgical incisions and rotational flap. There is some retraction of the right vulva towards her right leg. There is no evidence of recurrent Paget's. The area of the vulva that is causing some pruritus is no visible lesions that all in the upper mons on the outer portion of the labia on her right side. There are no visible lesions within the vagina. The patient does have a notable rectocele. I believe this may be contributing to her difficulty with defecation. She was shown how to apply pressure to the posterior wall of the vagina to help with bowel movements.   Assessment/Plan: 71 year old with recurrent vulvar Paget's disease status post definitive surgery with wide local excision and flaps. She did have a positive margin in the vagina however more tissue could not be taken from that area secondary to her rectocele and the vaginal margin.   There is no evidence of recurrent Paget's today. I will place a referral for urogynecology at the [facility] office to evaluate both her urinary incontinence and also see if there's any other options for her rectocele that may not be surgical that could help in terms of making defecation easier. At this point she'll use annual pressure to assist.  A prescription for nystatin cream was sent to her pharmacy to manage her cancer.  She was congratulated on her weight loss. She return to see me in 4 months.    Taurus Willis A., MD 07/23/2016, 1:06 PM

## 2016-07-23 ENCOUNTER — Other Ambulatory Visit: Payer: Self-pay | Admitting: Gynecologic Oncology

## 2016-07-23 ENCOUNTER — Ambulatory Visit: Payer: Medicare Other | Attending: Gynecologic Oncology | Admitting: Gynecologic Oncology

## 2016-07-23 ENCOUNTER — Encounter: Payer: Self-pay | Admitting: Gynecologic Oncology

## 2016-07-23 VITALS — BP 112/60 | HR 66 | Temp 97.8°F | Resp 18 | Ht 65.0 in | Wt 216.2 lb

## 2016-07-23 DIAGNOSIS — F329 Major depressive disorder, single episode, unspecified: Secondary | ICD-10-CM | POA: Diagnosis not present

## 2016-07-23 DIAGNOSIS — R32 Unspecified urinary incontinence: Secondary | ICD-10-CM | POA: Diagnosis not present

## 2016-07-23 DIAGNOSIS — H3530 Unspecified macular degeneration: Secondary | ICD-10-CM | POA: Insufficient documentation

## 2016-07-23 DIAGNOSIS — N816 Rectocele: Secondary | ICD-10-CM | POA: Diagnosis not present

## 2016-07-23 DIAGNOSIS — Z86010 Personal history of colonic polyps: Secondary | ICD-10-CM | POA: Diagnosis not present

## 2016-07-23 DIAGNOSIS — B372 Candidiasis of skin and nail: Secondary | ICD-10-CM

## 2016-07-23 DIAGNOSIS — C519 Malignant neoplasm of vulva, unspecified: Secondary | ICD-10-CM | POA: Insufficient documentation

## 2016-07-23 DIAGNOSIS — K219 Gastro-esophageal reflux disease without esophagitis: Secondary | ICD-10-CM | POA: Diagnosis not present

## 2016-07-23 DIAGNOSIS — Z87891 Personal history of nicotine dependence: Secondary | ICD-10-CM | POA: Insufficient documentation

## 2016-07-23 DIAGNOSIS — E039 Hypothyroidism, unspecified: Secondary | ICD-10-CM | POA: Diagnosis not present

## 2016-07-23 DIAGNOSIS — Z9889 Other specified postprocedural states: Secondary | ICD-10-CM | POA: Insufficient documentation

## 2016-07-23 DIAGNOSIS — Z8544 Personal history of malignant neoplasm of other female genital organs: Secondary | ICD-10-CM | POA: Diagnosis not present

## 2016-07-23 DIAGNOSIS — J45909 Unspecified asthma, uncomplicated: Secondary | ICD-10-CM | POA: Diagnosis not present

## 2016-07-23 DIAGNOSIS — C4499 Other specified malignant neoplasm of skin, unspecified: Secondary | ICD-10-CM | POA: Insufficient documentation

## 2016-07-23 DIAGNOSIS — F419 Anxiety disorder, unspecified: Secondary | ICD-10-CM | POA: Insufficient documentation

## 2016-07-23 DIAGNOSIS — L280 Lichen simplex chronicus: Secondary | ICD-10-CM | POA: Insufficient documentation

## 2016-07-23 DIAGNOSIS — Z853 Personal history of malignant neoplasm of breast: Secondary | ICD-10-CM | POA: Insufficient documentation

## 2016-07-23 DIAGNOSIS — M797 Fibromyalgia: Secondary | ICD-10-CM | POA: Diagnosis not present

## 2016-07-23 DIAGNOSIS — B379 Candidiasis, unspecified: Secondary | ICD-10-CM

## 2016-07-23 MED ORDER — NYSTATIN 100000 UNIT/GM EX CREA: 1.0000 "application " | TOPICAL_CREAM | Freq: Two times a day (BID) | CUTANEOUS | 3 refills | Status: DC

## 2016-07-23 NOTE — Patient Instructions (Addendum)
Return to see Dr. Alycia Rossetti in [facility] in 4 months.  Nystatin skin cream or ointment What is this medicine? NYSTATIN (nye STAT in) is an antifungal medicine. It is used to treat certain kinds of fungal or yeast infections of the skin. This medicine may be used for other purposes; ask your health care provider or pharmacist if you have questions. COMMON BRAND NAME(S): Mycostatin, Nystex, Pediaderm AF What should I tell my health care provider before I take this medicine? They need to know if you have any of these conditions: -an unusual or allergic reaction to nystatin, other foods, dyes or preservatives -pregnant or trying to get pregnant -breast-feeding How should I use this medicine? This medicine is for external use on the skin only. Follow the directions on the prescription label. Wash hands before and after use. If treating a hand or nail infection, wash hands before use only. Apply a thin layer of this medicine to cover the affected skin and surrounding area. You can cover the area with a sterile gauze dressing (bandage). Do not use an airtight bandage (such as a plastic-covered bandage). Do not get the medicine in your eyes. If you do, rinse out with plenty of cool tap water. Use the full course of treatment prescribed, even if you think the infection is getting better. Use at regular intervals. Do not use your medicine more often than directed. Do not use this medicine for any condition other than the one for which it was prescribed. Talk to your pediatrician regarding the use of this medicine in children. Special care may be needed. Overdosage: If you think you have taken too much of this medicine contact a poison control center or emergency room at once. NOTE: This medicine is only for you. Do not share this medicine with others. What if I miss a dose? If you miss a dose, use it as soon as you can. If it is almost time for your next dose, use only that dose. Do not use double or extra  doses. What may interact with this medicine? Interactions are not expected. Do not use any other skin products on the affected area without telling your doctor or health care professional. This list may not describe all possible interactions. Give your health care provider a list of all the medicines, herbs, non-prescription drugs, or dietary supplements you use. Also tell them if you smoke, drink alcohol, or use illegal drugs. Some items may interact with your medicine. What should I watch for while using this medicine? Tell your doctor or health care professional if your symptoms do not improve after 3 days. After bathing make sure that your skin is very dry. Fungal infections like moist conditions. Do not walk around barefoot. To help prevent reinfection, wear freshly washed cotton, not synthetic, clothing. What side effects may I notice from receiving this medicine? Side effects that usually do not require medical attention (report to your doctor or health care professional if they continue or are bothersome): -skin irritation This list may not describe all possible side effects. Call your doctor for medical advice about side effects. You may report side effects to FDA at 1-800-FDA-1088. Where should I keep my medicine? Keep out of the reach of children. Store at room temperature 15 to 30 degrees C (59 to 86 degrees F). Throw away any unused medicine after the expiration date. NOTE: This sheet is a summary. It may not cover all possible information. If you have questions about this medicine, talk to your  doctor, pharmacist, or health care provider.  2018 [employer] (2015-06-13 16:28:30)

## 2016-07-25 ENCOUNTER — Telehealth: Payer: Self-pay

## 2016-07-25 NOTE — Telephone Encounter (Signed)
Told Ms Urich the her records from Dr. Alycia Rossetti were sent to Dr. Jacqlyn Larsen for 07-30-16 appointment. Dr. Clois DupesQR:[id number] Fax: [phone]. Attention Olivia Mackie.

## 2016-07-30 DIAGNOSIS — R339 Retention of urine, unspecified: Secondary | ICD-10-CM | POA: Insufficient documentation

## 2016-07-30 HISTORY — DX: Retention of urine, unspecified: R33.9

## 2016-09-11 DIAGNOSIS — D414 Neoplasm of uncertain behavior of bladder: Secondary | ICD-10-CM | POA: Insufficient documentation

## 2016-09-11 HISTORY — DX: Neoplasm of uncertain behavior of bladder: D41.4

## 2016-09-12 ENCOUNTER — Ambulatory Visit (INDEPENDENT_AMBULATORY_CARE_PROVIDER_SITE_OTHER): Payer: Medicare Other | Admitting: Podiatry

## 2016-09-12 DIAGNOSIS — L84 Corns and callosities: Secondary | ICD-10-CM

## 2016-09-14 NOTE — Progress Notes (Signed)
Subjective:     Patient ID: [name], female   DOB: 1946/05/03, 71 y.o.   MRN: [id number]  HPI patient presents with chronic plantar callus formation bilateral   Review of Systems     Objective:   Physical Exam Neurovascular status intact with patient found to have discomfort in the plantar foot secondary to callus    Assessment:     Corn callus formation    Plan:     Debris callus with no iatrogenic bleeding noted

## 2016-11-19 ENCOUNTER — Ambulatory Visit: Payer: Medicare Other | Attending: Gynecologic Oncology | Admitting: Gynecologic Oncology

## 2016-11-19 ENCOUNTER — Encounter: Payer: Self-pay | Admitting: Gynecologic Oncology

## 2016-11-19 VITALS — BP 148/77 | HR 71 | Temp 97.7°F | Resp 20 | Wt 218.0 lb

## 2016-11-19 DIAGNOSIS — Z8051 Family history of malignant neoplasm of kidney: Secondary | ICD-10-CM | POA: Diagnosis not present

## 2016-11-19 DIAGNOSIS — Z79899 Other long term (current) drug therapy: Secondary | ICD-10-CM | POA: Diagnosis not present

## 2016-11-19 DIAGNOSIS — C519 Malignant neoplasm of vulva, unspecified: Secondary | ICD-10-CM | POA: Diagnosis present

## 2016-11-19 DIAGNOSIS — F329 Major depressive disorder, single episode, unspecified: Secondary | ICD-10-CM | POA: Insufficient documentation

## 2016-11-19 DIAGNOSIS — Z880 Allergy status to penicillin: Secondary | ICD-10-CM | POA: Insufficient documentation

## 2016-11-19 DIAGNOSIS — H3530 Unspecified macular degeneration: Secondary | ICD-10-CM | POA: Diagnosis not present

## 2016-11-19 DIAGNOSIS — M797 Fibromyalgia: Secondary | ICD-10-CM | POA: Insufficient documentation

## 2016-11-19 DIAGNOSIS — Z888 Allergy status to other drugs, medicaments and biological substances status: Secondary | ICD-10-CM | POA: Diagnosis not present

## 2016-11-19 DIAGNOSIS — Z853 Personal history of malignant neoplasm of breast: Secondary | ICD-10-CM | POA: Insufficient documentation

## 2016-11-19 DIAGNOSIS — Z8544 Personal history of malignant neoplasm of other female genital organs: Secondary | ICD-10-CM | POA: Diagnosis not present

## 2016-11-19 DIAGNOSIS — Z87891 Personal history of nicotine dependence: Secondary | ICD-10-CM | POA: Insufficient documentation

## 2016-11-19 DIAGNOSIS — K219 Gastro-esophageal reflux disease without esophagitis: Secondary | ICD-10-CM | POA: Insufficient documentation

## 2016-11-19 DIAGNOSIS — Z923 Personal history of irradiation: Secondary | ICD-10-CM | POA: Diagnosis not present

## 2016-11-19 DIAGNOSIS — K5900 Constipation, unspecified: Secondary | ICD-10-CM | POA: Diagnosis not present

## 2016-11-19 DIAGNOSIS — R32 Unspecified urinary incontinence: Secondary | ICD-10-CM | POA: Diagnosis not present

## 2016-11-19 DIAGNOSIS — Z885 Allergy status to narcotic agent status: Secondary | ICD-10-CM | POA: Diagnosis not present

## 2016-11-19 DIAGNOSIS — E039 Hypothyroidism, unspecified: Secondary | ICD-10-CM | POA: Insufficient documentation

## 2016-11-19 DIAGNOSIS — N816 Rectocele: Secondary | ICD-10-CM | POA: Insufficient documentation

## 2016-11-19 DIAGNOSIS — C4499 Other specified malignant neoplasm of skin, unspecified: Secondary | ICD-10-CM

## 2016-11-19 DIAGNOSIS — F419 Anxiety disorder, unspecified: Secondary | ICD-10-CM | POA: Insufficient documentation

## 2016-11-19 DIAGNOSIS — Z801 Family history of malignant neoplasm of trachea, bronchus and lung: Secondary | ICD-10-CM | POA: Diagnosis not present

## 2016-11-19 DIAGNOSIS — Z9221 Personal history of antineoplastic chemotherapy: Secondary | ICD-10-CM | POA: Diagnosis not present

## 2016-11-19 NOTE — Progress Notes (Signed)
ASSESSMENT AND PLAN: [name] 71 y.o. 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 She had a + margin at vagina and could not take more due to anatomy and rectocoele. Will follow clinically. She was also recently seen by you and see urology. She had a bladder biopsy in April 2018 that was negative. She was given Botox injections which helped with her urinary symptoms.    Consult Note: Gyn-Onc  [name] 71 y.o. female  CC:  Chief Complaint  Patient presents with  . Follow-up    HPI:  Patient is a 71 year old with a history of a partial simple vulvectomy in February of 2012 for vulvar Paget's disease. Some of the surgical margins were positive. Her postoperative course was uncomplicated. She was last seen by our service in 12/14. At that time there was no evidence of any lesions consistent with Paget's. However, there is considerable amount of excoriations throughout the entire vulvar and medial thighs consistent with chronic vulvitis.In December 2013 she had a hernia repair due to bowel obstruction by Dr. Excell Seltzer with permanent mesh. In May 2013 she also had a TVT bladder by Dr. Everett Graff. She does continue to wear a pad and also takes Detrol twice daily but states that her urinary leakage is markedly improved. She described the vulvar irritation is intermittent itching with burning and that it has increased over time as has the pruritus. She does use a steroid cream twice daily and does scratch her vulva.   She underwent a wide local excision of the left vulva on January 2 of year 2014. Operative findings included 2 separate 3 x 3 and 1.2 cm lesions consistent with Paget's on the left.  Pathology revealed 2 foci of extramammary Paget's disease extending to the left inked margin in the right and anterior tip margins at multiple foci. The posterior margin was negative for malignancy.   I  performed a biopsy of this it was consistent with recurrent Paget's disease. On 11/23/2012 showed a repeat wide local excision of liver x2 with the posterior fourchette biopsy. Findings revealed along the left labia minora/vagina is a Paget's disease measuring approximately 2.5 x 1 cm in the superior aspect of her prior left vulvar excision. The inferior aspect of a similar lesion. 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 On the left perineal body the area is somewhat excoriated pale. Biopsy was performed in this area.  Pathology revealed:  1. Vulva, biopsy, left inferior - EXTRAMAMMARY PAGET DISEASE EXTENDING TO THE EDGES OF THE BIOPSY. 2. Vulva, excision, left superior lesion suture marks 1200 - EXTRAMAMMARY PAGET DISEASE. - MARGINS NOT INVOLVED. - CLOSEST MARGIN 9 O'CLOCK, LESS THAN 0.1 CM. 3. Vulva, excision, left inferior excision suture marks 1200 - EXTRAMAMMARY PAGET DISEASE, 12 O'CLOCK MARGIN FOCALLY INVOLVED. - REMAINING MARGINS CLEAR.  We have discussed repeating surgery however she had multiple orthopedic issues had broken ankle. She was seen by [name] in December 2014. Exam at that time was fairly unremarkable.  I had seen her in January of 2015. At that time exam was concerning for Paget's.   On 08/02/2013 she underwent wide local excisions of the vulva x2.  Operative findings included pale pink raised lesion measuring 1 x 0.5 cm the left upper vulva near the prior excision site. There is a pale raised lesion measuring 1.5 x 1 cm on the left crossing midline perineal body.  Pathology  revealed left superior vulvar excision with extramammary Paget's disease extending to the 9:00 margin. The other margins were negative for tumor. Of the left of midline lesion she extramammary Paget's disease extending to the 12:00 margin and focally at the 6:00 margin but otherwise negative margins. The patient at that time discussed with [place] that she was having some  right lower quadrant pain. Her exam is very difficult secondary to habitus. There ultrasound was obtained. Ultrasound revealed:  FINDINGS:  Uterus Measurements: 4.6 x 3.5 x 3.8 cm. 2.5 x 1.7 x 1.6 cm uterine fibroid. This is present in the right lateral aspect of the body uterus.  Endometrium Thickness: 6.1 mm. No focal abnormality visualized.  Right ovary: No focal abnormality.  Left ovary :No focal abnormality.  Other findings: No free fluid  IMPRESSION:  Uterine fibroid otherwise negative exam.  I saw her March 16, 2014 at which time there was an area on the left vulva with hyperkeratosis. A biopsy of that area was performed consistent with Paget's disease. On March 29, 2014 she underwent wide local excision of the vulva. On exam she had a 1 cm left vulvar lesion just below the left labia minora. She underwent a wide local excision. He received a 3.7 x 1.7 x 0.3 cm lesion. There was extramammary Paget's disease extending to the 6 to 9:00 margin in the 12:00 margin. This is despite grossly negative margins on physical examination.   She had an excision in November that revealed Diagnosis Vulva, excision, w/stitch @ 1200 - EXTRAMAMMARY PAGET'S DISEASE, EXTENDING TO THE 6-9 O'CLOCK MARGIN AND 12 O'CLOCK PERPENDICULAR MARGIN.  I saw her in February 2016 at which time there was concern for recurrent disease. She decided to proceed with Aldara therapy. She used for approximate 4 weeks and then decided to stop it secondary to the expense of the medication. She then started using the clobetasol. While she was using the Aldara all the pruritus in her vulva resolved itself. Since she stopped it and was using the steroid cream the pruritus returned.  I saw her in May 2016 at which time she has small area consistent with Paget's on the right labia minora and a larger patch on the left. Biopsy on the right revealed extramammary Paget's. After discussion she wished to try the Aldara again. She called  and requested an earlier appointment as for the last 2-3 weeks had increased itching and has been scratching so much that she's had some bleeding. I last saw her on July 6. At that time she had been having a lot of symptoms of pruritus and scratching a great deal. Vulvar examination at that time revealed a fairly macerated vulva and it was possible to discern what could be related to Paget's, effect of the Aldara therapy, trauma from scratching. She was asked to stop the Aldara therapy and use a low-dose daily cream. We asked her to refrain from scratching come in today for evaluation.  I saw her again December 26, 2104 at which time there was evidence clearly a Paget's disease and she scheduled for surgery. On January 24, 2015 she underwent bilateral wide local excisions of the vulva. On the right vulva there was a 2 cm patch of Paget's disease in the mid labia majora. On the left side. The remaining labia minora in the area prior to the excision are smaller lesion consistent with Paget's disease.  Diagnosis 1. Vulva, excision, left wide excision - EXTRAMAMMARY PAGET'S DISEASE EXTENDING TO THE 12 O'CLOCK, 3 O'CLOCK, 6  O'CLOCK AND 9 O'CLOCK MARGINS. 2. Vulva, excision, right wide excision - EXTRAMAMMARY PAGET'S EXTENDING TO THE 3 O'CLOCK MARGIN AND THE 6 O'CLOCK POLE.  Please refer to the photograph in the operative note from Dr. Skeet Latch for complete mapping of her lesion. She still having a little bit of spotting from the biopsy sites and some pain.  The patient initially underwent partial simple vulvectomy in February 2012 for vulvar Paget's disease. Some of her surgical margins were positive. She had an uncomplicated post operative course. She had some evidence of vulvar irritation afterward with symptoms of pruritus and burning that increased over time and for which she was using a steroid cream twice daily. She underwent wide local excision of the left vulva in January 2014. Findings at the time of  surgery were to areas consistent with Paget's on the left, one measuring 3 x 3 cm and one measuring 1.2 cm pathology revealed 2 foci of extramammary Paget's disease extending to the left intact margin in the right and anterior tip margins at multiple foci. The patient was seen for follow-up with recurrent Paget's disease noted. In July 2014, she underwent repeat wide local excision of 2 areas in the posterior fourchette biopsy. Findings were an area along the left labium minora measuring 2.5 x 1 cm at the superior aspect of her prior left vulvar excision. There was a similar lesion at the inferior aspect. It appeared that the patient also had lichen sclerosis of the right vulva. Left perineal body biopsy was performed secondary to some noted excoriation. Pathology from the 2 excisions showed extramammary Paget's disease, margins on left superior lesion were negative, margin at 12:00 on inferior lesion focally involved. Perineal body biopsy positive for Paget's disease.   The patient then underwent several orthopedic surgeries and ultimately was taken back to the operating room in March 2015 for 2 wide local excisions of the vulva. Findings at that time included a pill raised pink lesion measuring 1 x 0.5 cm of the left upper vulva near the prior excision site and a second pill raised lesion measuring 1.5 x 1 cm on the left crossing midline perineal body. Pathology showed that the left superior vulvar excision was Paget's disease extending to the 9:00 margin. Other margins were negative. The left midline lesion also was Paget's disease extending to the 12:00 margin and focally to the 6:00 margin. Patient was then seen back in October 2015 at which time there was an area on the left vulva with hyperkeratosis. A biopsy of that area confirmed Paget's disease. Patient underwent repeat wide local excision of the vulva on March 29, 2014. Findings were a 1 cm left vulvar lesion just below the left labium minora.  Pathology showed Paget's disease extending to the 6, 9, and 12:00 margins. This was despite grossly negative margins on physical exam.  Patient was next seen in February 2016 at which time there was concern for recurrent disease. She decided to proceed with Aldara therapy which she uses for 4 weeks and then stopped it secondary to the expense of the medication. Started using clobetasol. Her pruritus stopped with Aldara, but unfortunately returned when she switched to clobetasol. In May 2016 she was seen and had an area that was smaller still consistent with Paget's disease on the right labia minora and a larger patch on the left. Biopsy on the right revealed extramammary Paget's. She opted to try Aldara again. Given evidence of recurrent disease she underwent bilateral wide local excision of the vulva at  the end of August 2016. Both wide local excision specimens were positive for Paget's disease with extension to focal margins on each.  Most recently, the patient was taken to the operating room on 01/31/2016 for preoperative mapping of her lesion for her planned wide local excision of the left vulva and smaller excision on the right. Operative findings at that time were notable for surgically absent inferior aspect of the left labium minora and majora. Multiple areas with white flaky skin changes were biopsied and the abnormal-appearing tissue. Changes noted in the area of the clitoris and perineum and at the introitus just distal to the hymen. Biopsies were collected on all of the sites.   INTERVAL HISTORY: 10/17 Findings: 1. Surgical absent left labia majorum inferiorly from prior vulvectomies.  2. Focusing on the left vulva, there was gross disease incorporating the prior surgical site, characterized by patchy white and erythematous epithelium. The affected area spanned from the perineal body to 5-6cm laterally. Prior mapping biopsy sites x3 were well healed and visible and surrounded by what appeared to  be grossly normal appearing skin. Superiorly on the left labia majorum, there was affected epithelium with a similar appearing patchy white, erythematous epithelium.  3. Focusing of the right vulva, there was less extensive disease. Affected area extended from the perineal body to roughly 3cm laterally with minimal involvement of the right labia majorum.  4. The urethra, anus, and clitoris were spared.  5. A rectal exam was performed during the procedure to confirm protection of the sphincter, which was intact at the conclusion of our case.  6. A rectocele was notable 3cm into the vagina.   Pathology: Final Diagnosis  A: Vulva, right and left, vulvectomy - (Recurrent/persisent) Paget disease  - Disease size approximately 8.3 x 4.6 cm  - Disease confined to the epidermis - Multiple epidermal margins are positive for disease: 6 o'clock to 11 o'clock (per original orientation)  - Deep margin free of involvement   Stage/Disposition: Jenefer Woerner is a 71 y.o. woman with recurrent extramammary Paget's disease.   I last saw her 2/18. She was seen by urology and had Botox injections. At the time of her last visit her incontinence had improved, her evening voiding had also diminished. She states that her symptoms are getting a little bit worse. Immediately after the Botox she was using a pad for the entire day and was usually dry now she is back to 2-3 pads a day. She noticed some itching on her right side a few weeks ago as well as posterior gluteus on her left. She continues to have some difficulty with bowel movements. She has increased her fiber. She takes for stool softeners a day as well as Barbaraann Faster asked once a day. She does complain of some belching of her food with eating. She was seen by her gastroenterologist who increased her Protonix. This happened a few weeks ago and is just too soon to see if it is any difference. She passes flatus without difficulty. She does have bowel movements  but occasionally she will have to put the posterior pressure on the posterior aspect of the vagina to encourage the bowel movements to come out. After she does that she might have some bleeding. She otherwise has no complaints.   Current Meds:  Outpatient Encounter Prescriptions as of 11/19/2016  Medication Sig  . albuterol (PROVENTIL HFA;VENTOLIN HFA) 108 (90 BASE) MCG/ACT inhaler Inhale 2 puffs into the lungs every 4 (four) hours as needed for wheezing or  shortness of breath. Reported on 08/01/2015  . albuterol (PROVENTIL) (2.5 MG/3ML) 0.083% nebulizer solution Take 2.5 mg by nebulization every 6 (six) hours as needed for wheezing or shortness of breath. Reported on 08/01/2015  . beta carotene w/minerals (OCUVITE) tablet Take 2 tablets by mouth daily.   Marland Kitchen buPROPion (WELLBUTRIN XL) 150 MG 24 hr tablet Take 150 mg by mouth every morning.   . Calcium Carbonate-Vitamin D 600-125 MG-UNIT TABS Take 1 tablet by mouth daily.   . Cholecalciferol (VITAMIN D3) 1000 UNITS CAPS Take 2 capsules by mouth daily.  . citalopram (CELEXA) 40 MG tablet Take 40 mg by mouth every morning.   . clotrimazole-betamethasone (LOTRISONE) cream Apply topically 2 (two) times daily.  Marland Kitchen docusate sodium (COLACE) 100 MG capsule Take 300 mg by mouth daily.   Marland Kitchen levothyroxine (SYNTHROID, LEVOTHROID) 175 MCG tablet TAKE 1 TABLET (175 MCG TOTAL) BY MOUTH DAILY.--  TAKING NAME BRAND SYNTHROID--  TAKES IN AM  . montelukast (SINGULAIR) 10 MG tablet Take 10 mg by mouth daily as needed.   . Multiple Vitamins-Minerals (MULTIVITAMIN GUMMIES ADULT) CHEW Chew 2 tablets by mouth daily.  Marland Kitchen nystatin cream (MYCOSTATIN) Apply 1 application topically 2 (two) times daily.  . Omega-3 1000 MG CAPS Take 1 g by mouth daily.   Marland Kitchen oxyCODONE (OXY IR/ROXICODONE) 5 MG immediate release tablet Take 1-2 tablets (5-10 mg total) by mouth every 4 (four) hours as needed for severe pain. (Patient not taking: Reported on 07/23/2016)  . pantoprazole (PROTONIX) 40 MG tablet  Take 40 mg by mouth every morning.   . polyvinyl alcohol (LIQUIFILM TEARS) 1.4 % ophthalmic solution Place 1 drop into both eyes as needed for dry eyes.  . silver sulfADIAZINE (SILVADENE) 1 % cream Apply 1 application topically daily.  Marland Kitchen spironolactone (ALDACTONE) 25 MG tablet Take 0.5-1 tablets (12.5-25 mg total) by mouth as needed (for swelling).  . traMADol (ULTRAM) 50 MG tablet Take 1 tablet (50 mg total) by mouth every 6 (six) hours as needed.   No facility-administered encounter medications on file as of 11/19/2016.     Allergy:  Allergies  Allergen Reactions  . Codeine Other (See Comments)    hallucinations Hyper/ Hallucinations   . Demerol [Meperidine] Other (See Comments)    "Knocks me out"  . Diazepam Other (See Comments)    Extreme sedation; patient does not recall taking diazepam (12/07/14)  . Tape Hives and Other (See Comments)    "thick clear plastic tape" causes blisters  . Morphine And Related Itching and Rash    Per CRNA, pt tol Fentanyl IV for OR case, no issues reported to rec RN in PACU, no immediate PACU issues noted as well upon arrival  . Penicillins Hives and Swelling  . Valisone [Betamethasone] Rash    Social Hx:   Social History   Social History  . Marital status: Married    Spouse name: N/A  . Number of children: N/A  . Years of education: N/A   Occupational History  . Not on file.   Social History Main Topics  . Smoking status: Former Smoker    Packs/day: 2.00    Years: 41.00    Types: Cigarettes    Quit date: 06/05/1999  . Smokeless tobacco: Never Used  . Alcohol use No  . Drug use: No  . Sexual activity: Not on file   Other Topics Concern  . Not on file   Social History Narrative  . No narrative on file    Past Surgical Hx:  Past Surgical History:  Procedure Laterality Date  . BLADDER SUSPENSION  10/13/2011   Procedure: TRANSVAGINAL TAPE (TVT) PROCEDURE;  Surgeon: Delice Lesch, MD;  Location: [facility];  Service: Gynecology;   Laterality: N/A;  . BREAST LUMPECTOMY Left 03-27-2004  . CARDIAC CATHETERIZATION  10-30-2000  dr Wynonia Lawman   normal coronary arteries and LVF/  ef 65-70%  . CLOSED MANIPULATION  W/ OPEN REDUCTION EXCHANGE TIBIAL FEMORAL BEARING POST TOTAL LEFT KNEE  04-14-2001  . CYSTOSCOPY  10/13/2011   Procedure: CYSTOSCOPY;  Surgeon: Delice Lesch, MD;  Location: [facility];  Service: Gynecology;  Laterality: Bilateral;  . DILATION AND CURETTAGE OF UTERUS  1968  . FOOT SURGERY Left 02-01-2009   TRIPLE ARTHRODESIS  . FOOT SURGERY Left 11/14   I&D left foot with woundvac placement  . INCISIONAL HERNIA REPAIR  05/05/2011   Procedure: LAPAROSCOPIC INCISIONAL HERNIA;  Surgeon: Edward Jolly, MD;  Location: [facility];  Service: General;  Laterality: N/A;  LAPAROSCOPIC REPAIR INCARCERATED INCISIONAL HERNIA  WITH MESH  . KNEE ARTHROSCOPY Bilateral left [id number] & 04-1992/   right [id number]  . LAPAROSCOPIC CHOLECYSTECTOMY  06-21-2010  . LUMBAR SPINE SURGERY  1998   L5 -- S1  . MASTECTOMY Right 1997   W/  LYMPH NODE DISSECTION AND RECONSTRUCTION WITH IMPLANTS AND EXPANDER  . NEGATIVE SLEEP STUDY  12/26/2015  . PORT-A-CATH PLACEMENT AND REMOVAL  1997  . REMOVAL RIGHT BREAST IMPLANT AND CAPSULECTOMY  06-03-1999  . REVISION TOTAL KNEE ARTHROPLASTY Left 07-07-2005  &  12-10-2001   12-10-2001  REMOVAL TOTAL KNEE/ I&D / INSERTION ANTIBIOTIC SPACER  . REVISION TOTAL KNEE ARTHROPLASTY Right 07-07-2005  . ROTATOR CUFF REPAIR Right   . TENDON REPAIR RIGHT RING FINGER  2011  . THUMB TRIGGER RELEASE Right 1993  . THYROIDECTOMY, PARTIAL  1983  . TONSILLECTOMY  1968  . TOTAL KNEE ARTHROPLASTY Bilateral right 05-04-2000/  left 03-29-2001  . VULVECTOMY  05/27/2012   Procedure: WIDE EXCISION VULVECTOMY;  Surgeon: Imagene Gurney A. Alycia Rossetti, MD;  Location: [facility];  Service: Gynecology;  Laterality: N/A;  Wide Local Excision of Vulva   . VULVECTOMY N/A 11/23/2012   Procedure: WIDE LOCAL EXCISION OF THE VULVA;  Surgeon: Imagene Gurney A. Alycia Rossetti, MD;   Location: [facility];  Service: Gynecology;  Laterality: N/A;  left inferior vulvar biopsy excision left superior lesion left inferior vulvar excision  . VULVECTOMY N/A 08/02/2013   Procedure: WIDE LOCAL  EXCISION VULVA;  Surgeon: Imagene Gurney A. Alycia Rossetti, MD;  Location: [facility];  Service: Gynecology;  Laterality: N/A;  . VULVECTOMY Left 03/29/2014   Procedure: WIDE LOCAL EXCISION OF LEFT VULVA ;  Surgeon: Imagene Gurney A. Alycia Rossetti, MD;  Location: [facility];  Service: Gynecology;  Laterality: Left;  Marland Kitchen VULVECTOMY Bilateral 01/24/2015   Procedure: WIDE LOCAL EXCISION VULVA;  Surgeon: [name], MD;  Location: [facility];  Service: Gynecology;  Laterality: Bilateral;  . VULVECTOMY PARTIAL  07-16-2010    Past Medical Hx:  Past Medical History:  Diagnosis Date  . Anxiety   . Arthritis   . Asthma   . Chronic constipation   . Depression   . Fibromyalgia   . GERD (gastroesophageal reflux disease)   . 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 History of colon polyps    2007 hyperplagia  . History of esophageal  dilatation    2008  . History of peptic ulcer   . History of small bowel obstruction    2012  . Hypothyroidism   . Macular degeneration of right eye   . Numbness of left foot    4 toes  . Paget's disease of vulva   . PONV (postoperative nausea and vomiting)    and HARD TO WAKE  . Seasonal allergies   . SUI (stress urinary incontinence, female)   . Wears glasses     Family Hx:  Family History  Problem Relation Age of Onset  . Cancer Father 56       lung and kidney   . Alzheimer's disease Mother   . Heart attack Maternal Grandmother   . Alcohol abuse Brother     Vitals:  Blood pressure (!) 148/77, pulse 71, temperature 97.7 F (36.5 C), temperature source Oral, resp. rate 20, weight 218 lb (98.9 kg), SpO2 97  %.  Physical Exam: Well-nourished well-developed female in no acute distress.  Abdomen: Soft, nontender, nondistended. Significant candida under the pannus across the entire aspect of the pannus but the right side is worse than the left.  Extremities: Marked orthopedic changes with the formation of her left foot. Well-healed surgical incisions from her donor site on the left leg. There was a small area that appeared to have been somewhat inflamed but that is resolved and the incision is well-healed. The vulva is notable for surgical incisions and rotational flap. There is some retraction of the right vulva towards her right leg. There is no evidence of recurrent Paget's. The area of the vulva that is causing some pruritus has no visible lesions. This is in fact on the left gluteus in the lower aspect of the donor site. There is evidence of scratching. This is in the area of scar from the donor site and not in an area of Paget's. There are no visible lesions within the vagina. The patient does have a notable rectocele. I believe this may be contributing to her difficulty with defecation.   Assessment/Plan: 71 year old with recurrent vulvar Paget's disease status post definitive surgery with wide local excision and flaps. She did have a positive margin in the vagina however more tissue could not be taken from that area secondary to her rectocele and the vaginal margin.   There is no evidence of recurrent Paget's today.   She will call urology to schedule follow-up with him and consider additional Botox as it did help her significantly. We discussed her bowel movements and it seems that she is passing which she describes as "balls" and that she might still have some element of constipation. She states that she's was taken after her dad's family with regards to this in her dad needed to drink magnesium citrate to have bowel movements. She has done this previously and is helped her but she's not done in a  long time. I don't think there is anything well with her trying some magnesium citrate to see if that helps. She will follow-up with her other physicians as scheduled and return to see me in 3-4 months.   Nerida Boivin A., MD 11/19/2016, 11:48 AM

## 2016-12-15 DIAGNOSIS — M810 Age-related osteoporosis without current pathological fracture: Secondary | ICD-10-CM | POA: Insufficient documentation

## 2016-12-15 HISTORY — DX: Age-related osteoporosis without current pathological fracture: M81.0

## 2017-01-08 ENCOUNTER — Encounter: Payer: Self-pay | Admitting: Podiatry

## 2017-01-08 ENCOUNTER — Ambulatory Visit (INDEPENDENT_AMBULATORY_CARE_PROVIDER_SITE_OTHER): Payer: Medicare Other | Admitting: Podiatry

## 2017-01-08 DIAGNOSIS — M779 Enthesopathy, unspecified: Secondary | ICD-10-CM

## 2017-01-08 DIAGNOSIS — L84 Corns and callosities: Secondary | ICD-10-CM | POA: Diagnosis not present

## 2017-01-08 MED ORDER — TRIAMCINOLONE ACETONIDE 10 MG/ML IJ SUSP
10.0000 mg | Freq: Once | INTRAMUSCULAR | Status: AC
  Administered 2017-01-08: 10 mg

## 2017-01-08 NOTE — Progress Notes (Signed)
Subjective:    Patient ID: [name], female   DOB: 71 y.o.   MRN: [id number]   HPI patient presents with fluid buildup and inflammation around the fifth MPJ of the left foot and also has plantar keratotic lesion base of fifth metatarsal left that's thick and painful when palpated    ROS      Objective:  Physical Exam neurovascular status intact with inflammatory capsulitis of the fifth MPJ left with fluid buildup and keratotic lesion of the base of the fifth metatarsal left     Assessment:   Inflammatory capsulitis fifth MPJ left with keratotic lesion     Plan:    Careful injection administered fifth MPJ 3 mg Dexon some Kenalog 5 mill grams Xylocaine and went to the plantar lesion and debrided fully with no iatrogenic bleeding noted

## 2017-01-29 ENCOUNTER — Ambulatory Visit
Admission: RE | Admit: 2017-01-29 | Discharge: 2017-01-29 | Disposition: A | Discharge status: Home / Self Care | Payer: Medicare Other | Source: Ambulatory Visit | Attending: General Surgery | Admitting: General Surgery

## 2017-01-29 ENCOUNTER — Other Ambulatory Visit: Payer: Self-pay | Admitting: General Surgery

## 2017-01-29 DIAGNOSIS — R0789 Other chest pain: Secondary | ICD-10-CM

## 2017-01-29 HISTORY — DX: Personal history of antineoplastic chemotherapy: Z92.21

## 2017-01-29 HISTORY — DX: Personal history of irradiation: Z92.3

## 2017-02-25 ENCOUNTER — Encounter: Payer: Self-pay | Admitting: Gynecologic Oncology

## 2017-02-25 ENCOUNTER — Other Ambulatory Visit: Payer: Self-pay | Admitting: Gynecologic Oncology

## 2017-02-25 ENCOUNTER — Ambulatory Visit: Payer: Medicare Other | Attending: Gynecologic Oncology | Admitting: Gynecologic Oncology

## 2017-02-25 VITALS — BP 128/65 | HR 76 | Temp 97.9°F | Resp 20 | Ht 65.5 in | Wt 238.4 lb

## 2017-02-25 DIAGNOSIS — E039 Hypothyroidism, unspecified: Secondary | ICD-10-CM | POA: Diagnosis not present

## 2017-02-25 DIAGNOSIS — Z96653 Presence of artificial knee joint, bilateral: Secondary | ICD-10-CM | POA: Diagnosis not present

## 2017-02-25 DIAGNOSIS — L280 Lichen simplex chronicus: Secondary | ICD-10-CM | POA: Diagnosis not present

## 2017-02-25 DIAGNOSIS — N816 Rectocele: Secondary | ICD-10-CM | POA: Diagnosis not present

## 2017-02-25 DIAGNOSIS — Z803 Family history of malignant neoplasm of breast: Secondary | ICD-10-CM | POA: Insufficient documentation

## 2017-02-25 DIAGNOSIS — Z9889 Other specified postprocedural states: Secondary | ICD-10-CM | POA: Diagnosis not present

## 2017-02-25 DIAGNOSIS — F419 Anxiety disorder, unspecified: Secondary | ICD-10-CM | POA: Diagnosis not present

## 2017-02-25 DIAGNOSIS — N390 Urinary tract infection, site not specified: Secondary | ICD-10-CM | POA: Insufficient documentation

## 2017-02-25 DIAGNOSIS — Z79899 Other long term (current) drug therapy: Secondary | ICD-10-CM | POA: Diagnosis not present

## 2017-02-25 DIAGNOSIS — N9089 Other specified noninflammatory disorders of vulva and perineum: Secondary | ICD-10-CM | POA: Diagnosis not present

## 2017-02-25 DIAGNOSIS — Z880 Allergy status to penicillin: Secondary | ICD-10-CM | POA: Diagnosis not present

## 2017-02-25 DIAGNOSIS — Z79891 Long term (current) use of opiate analgesic: Secondary | ICD-10-CM | POA: Insufficient documentation

## 2017-02-25 DIAGNOSIS — Z8711 Personal history of peptic ulcer disease: Secondary | ICD-10-CM | POA: Insufficient documentation

## 2017-02-25 DIAGNOSIS — D696 Thrombocytopenia, unspecified: Secondary | ICD-10-CM

## 2017-02-25 DIAGNOSIS — Z885 Allergy status to narcotic agent status: Secondary | ICD-10-CM | POA: Diagnosis not present

## 2017-02-25 DIAGNOSIS — C519 Malignant neoplasm of vulva, unspecified: Secondary | ICD-10-CM | POA: Insufficient documentation

## 2017-02-25 DIAGNOSIS — J45909 Unspecified asthma, uncomplicated: Secondary | ICD-10-CM | POA: Diagnosis not present

## 2017-02-25 DIAGNOSIS — M889 Osteitis deformans of unspecified bone: Secondary | ICD-10-CM | POA: Diagnosis not present

## 2017-02-25 DIAGNOSIS — Z86010 Personal history of colonic polyps: Secondary | ICD-10-CM | POA: Diagnosis not present

## 2017-02-25 DIAGNOSIS — Z87891 Personal history of nicotine dependence: Secondary | ICD-10-CM | POA: Diagnosis not present

## 2017-02-25 DIAGNOSIS — Z811 Family history of alcohol abuse and dependence: Secondary | ICD-10-CM | POA: Insufficient documentation

## 2017-02-25 DIAGNOSIS — Z8249 Family history of ischemic heart disease and other diseases of the circulatory system: Secondary | ICD-10-CM | POA: Insufficient documentation

## 2017-02-25 DIAGNOSIS — K219 Gastro-esophageal reflux disease without esophagitis: Secondary | ICD-10-CM | POA: Insufficient documentation

## 2017-02-25 DIAGNOSIS — K56609 Unspecified intestinal obstruction, unspecified as to partial versus complete obstruction: Secondary | ICD-10-CM | POA: Diagnosis not present

## 2017-02-25 DIAGNOSIS — Z853 Personal history of malignant neoplasm of breast: Secondary | ICD-10-CM | POA: Diagnosis not present

## 2017-02-25 DIAGNOSIS — Z888 Allergy status to other drugs, medicaments and biological substances status: Secondary | ICD-10-CM | POA: Diagnosis not present

## 2017-02-25 DIAGNOSIS — C4499 Other specified malignant neoplasm of skin, unspecified: Secondary | ICD-10-CM

## 2017-02-25 DIAGNOSIS — M797 Fibromyalgia: Secondary | ICD-10-CM | POA: Diagnosis not present

## 2017-02-25 DIAGNOSIS — Z801 Family history of malignant neoplasm of trachea, bronchus and lung: Secondary | ICD-10-CM | POA: Insufficient documentation

## 2017-02-25 DIAGNOSIS — F329 Major depressive disorder, single episode, unspecified: Secondary | ICD-10-CM | POA: Diagnosis not present

## 2017-02-25 DIAGNOSIS — B372 Candidiasis of skin and nail: Secondary | ICD-10-CM

## 2017-02-25 DIAGNOSIS — H3530 Unspecified macular degeneration: Secondary | ICD-10-CM | POA: Insufficient documentation

## 2017-02-25 HISTORY — DX: Thrombocytopenia, unspecified: D69.6

## 2017-02-25 MED ORDER — FLUCONAZOLE 200 MG PO TABS: 200.0000 mg | ORAL_TABLET | Freq: Every day | ORAL | 1 refills | Status: DC

## 2017-02-25 NOTE — Progress Notes (Signed)
ASSESSMENT AND PLAN: [name] 71 y.o. 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 She had a + margin at vagina and could not take more due to anatomy and rectocoele. Will follow clinically. She was also recently seen by you and see urology. She had a bladder biopsy in April 2018 that was negative. She was given Botox injections which helped with her urinary symptoms.    Consult Note: Gyn-Onc  [name] 71 y.o. female  CC:  Chief Complaint  Patient presents with  . Paget's disease of vulva    HPI:  Patient is a 71 year old with a history of a partial simple vulvectomy in February of 2012 for vulvar Paget's disease. Some of the surgical margins were positive. Her postoperative course was uncomplicated. She was last seen by our service in 12/14. At that time there was no evidence of any lesions consistent with Paget's. However, there is considerable amount of excoriations throughout the entire vulvar and medial thighs consistent with chronic vulvitis.In December 2013 she had a hernia repair due to bowel obstruction by Dr. Excell Seltzer with permanent mesh. In May 2013 she also had a TVT bladder by Dr. Everett Graff. She does continue to wear a pad and also takes Detrol twice daily but states that her urinary leakage is markedly improved. She described the vulvar irritation is intermittent itching with burning and that it has increased over time as has the pruritus. She does use a steroid cream twice daily and does scratch her vulva.   She underwent a wide local excision of the left vulva on January 2 of year 2014. Operative findings included 2 separate 3 x 3 and 1.2 cm lesions consistent with Paget's on the left.  Pathology revealed 2 foci of extramammary Paget's disease extending to the left inked margin in the right and anterior tip margins at multiple foci. The posterior margin was negative for  malignancy.   I performed a biopsy of this it was consistent with recurrent Paget's disease. On 11/23/2012 showed a repeat wide local excision of liver x2 with the posterior fourchette biopsy. Findings revealed along the left labia minora/vagina is a Paget's disease measuring approximately 2.5 x 1 cm in the superior aspect of her prior left vulvar excision. The inferior aspect of a similar lesion. 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 On the left perineal body the area is somewhat excoriated pale. Biopsy was performed in this area.  Pathology revealed:  1. Vulva, biopsy, left inferior - EXTRAMAMMARY PAGET DISEASE EXTENDING TO THE EDGES OF THE BIOPSY. 2. Vulva, excision, left superior lesion suture marks 1200 - EXTRAMAMMARY PAGET DISEASE. - MARGINS NOT INVOLVED. - CLOSEST MARGIN 9 O'CLOCK, LESS THAN 0.1 CM. 3. Vulva, excision, left inferior excision suture marks 1200 - EXTRAMAMMARY PAGET DISEASE, 12 O'CLOCK MARGIN FOCALLY INVOLVED. - REMAINING MARGINS CLEAR.  We have discussed repeating surgery however she had multiple orthopedic issues had broken ankle. She was seen by [name] in December 2014. Exam at that time was fairly unremarkable.  I had seen her in January of 2015. At that time exam was concerning for Paget's.   On 08/02/2013 she underwent wide local excisions of the vulva x2.  Operative findings included pale pink raised lesion measuring 1 x 0.5 cm the left upper vulva near the prior excision site. There is a pale raised lesion measuring 1.5 x 1 cm on the left crossing midline perineal  body.  Pathology revealed left superior vulvar excision with extramammary Paget's disease extending to the 9:00 margin. The other margins were negative for tumor. Of the left of midline lesion she extramammary Paget's disease extending to the 12:00 margin and focally at the 6:00 margin but otherwise negative margins. The patient at that time discussed with [place] that  she was having some right lower quadrant pain. Her exam is very difficult secondary to habitus. There ultrasound was obtained. Ultrasound revealed:  FINDINGS:  Uterus Measurements: 4.6 x 3.5 x 3.8 cm. 2.5 x 1.7 x 1.6 cm uterine fibroid. This is present in the right lateral aspect of the body uterus.  Endometrium Thickness: 6.1 mm. No focal abnormality visualized.  Right ovary: No focal abnormality.  Left ovary :No focal abnormality.  Other findings: No free fluid  IMPRESSION:  Uterine fibroid otherwise negative exam.  I saw her March 16, 2014 at which time there was an area on the left vulva with hyperkeratosis. A biopsy of that area was performed consistent with Paget's disease. On March 29, 2014 she underwent wide local excision of the vulva. On exam she had a 1 cm left vulvar lesion just below the left labia minora. She underwent a wide local excision. He received a 3.7 x 1.7 x 0.3 cm lesion. There was extramammary Paget's disease extending to the 6 to 9:00 margin in the 12:00 margin. This is despite grossly negative margins on physical examination.   She had an excision in November that revealed Diagnosis Vulva, excision, w/stitch @ 1200 - EXTRAMAMMARY PAGET'S DISEASE, EXTENDING TO THE 6-9 O'CLOCK MARGIN AND 12 O'CLOCK PERPENDICULAR MARGIN.  I saw her in February 2016 at which time there was concern for recurrent disease. She decided to proceed with Aldara therapy. She used for approximate 4 weeks and then decided to stop it secondary to the expense of the medication. She then started using the clobetasol. While she was using the Aldara all the pruritus in her vulva resolved itself. Since she stopped it and was using the steroid cream the pruritus returned.  I saw her in May 2016 at which time she has small area consistent with Paget's on the right labia minora and a larger patch on the left. Biopsy on the right revealed extramammary Paget's. After discussion she wished to try the Aldara  again. She called and requested an earlier appointment as for the last 2-3 weeks had increased itching and has been scratching so much that she's had some bleeding. I last saw her on July 6. At that time she had been having a lot of symptoms of pruritus and scratching a great deal. Vulvar examination at that time revealed a fairly macerated vulva and it was possible to discern what could be related to Paget's, effect of the Aldara therapy, trauma from scratching. She was asked to stop the Aldara therapy and use a low-dose daily cream. We asked her to refrain from scratching come in today for evaluation.  I saw her again December 26, 2104 at which time there was evidence clearly a Paget's disease and she scheduled for surgery. On January 24, 2015 she underwent bilateral wide local excisions of the vulva. On the right vulva there was a 2 cm patch of Paget's disease in the mid labia majora. On the left side. The remaining labia minora in the area prior to the excision are smaller lesion consistent with Paget's disease.  Diagnosis 1. Vulva, excision, left wide excision - EXTRAMAMMARY PAGET'S DISEASE EXTENDING TO THE 12 O'CLOCK,  3 O'CLOCK, 6 O'CLOCK AND 9 O'CLOCK MARGINS. 2. Vulva, excision, right wide excision - EXTRAMAMMARY PAGET'S EXTENDING TO THE 3 O'CLOCK MARGIN AND THE 6 O'CLOCK POLE.  Please refer to the photograph in the operative note from Dr. Skeet Latch for complete mapping of her lesion. She still having a little bit of spotting from the biopsy sites and some pain.  The patient initially underwent partial simple vulvectomy in February 2012 for vulvar Paget's disease. Some of her surgical margins were positive. She had an uncomplicated post operative course. She had some evidence of vulvar irritation afterward with symptoms of pruritus and burning that increased over time and for which she was using a steroid cream twice daily. She underwent wide local excision of the left vulva in January 2014. Findings  at the time of surgery were to areas consistent with Paget's on the left, one measuring 3 x 3 cm and one measuring 1.2 cm pathology revealed 2 foci of extramammary Paget's disease extending to the left intact margin in the right and anterior tip margins at multiple foci. The patient was seen for follow-up with recurrent Paget's disease noted. In July 2014, she underwent repeat wide local excision of 2 areas in the posterior fourchette biopsy. Findings were an area along the left labium minora measuring 2.5 x 1 cm at the superior aspect of her prior left vulvar excision. There was a similar lesion at the inferior aspect. It appeared that the patient also had lichen sclerosis of the right vulva. Left perineal body biopsy was performed secondary to some noted excoriation. Pathology from the 2 excisions showed extramammary Paget's disease, margins on left superior lesion were negative, margin at 12:00 on inferior lesion focally involved. Perineal body biopsy positive for Paget's disease.   The patient then underwent several orthopedic surgeries and ultimately was taken back to the operating room in March 2015 for 2 wide local excisions of the vulva. Findings at that time included a pill raised pink lesion measuring 1 x 0.5 cm of the left upper vulva near the prior excision site and a second pill raised lesion measuring 1.5 x 1 cm on the left crossing midline perineal body. Pathology showed that the left superior vulvar excision was Paget's disease extending to the 9:00 margin. Other margins were negative. The left midline lesion also was Paget's disease extending to the 12:00 margin and focally to the 6:00 margin. Patient was then seen back in October 2015 at which time there was an area on the left vulva with hyperkeratosis. A biopsy of that area confirmed Paget's disease. Patient underwent repeat wide local excision of the vulva on March 29, 2014. Findings were a 1 cm left vulvar lesion just below the left labium  minora. Pathology showed Paget's disease extending to the 6, 9, and 12:00 margins. This was despite grossly negative margins on physical exam.  Patient was next seen in February 2016 at which time there was concern for recurrent disease. She decided to proceed with Aldara therapy which she used for 4 weeks and then stopped it secondary to the expense of the medication. Started using clobetasol. Her pruritus stopped with Aldara, but unfortunately returned when she switched to clobetasol. In May 2016 she was seen and had an area that was smaller still consistent with Paget's disease on the right labia minora and a larger patch on the left. Biopsy on the right revealed extramammary Paget's. She opted to try Aldara again. Given evidence of recurrent disease she underwent bilateral wide local excision of  the vulva at the end of August 2016. Both wide local excision specimens were positive for Paget's disease with extension to focal margins on each.  Most recently, the patient was taken to the operating room on 01/31/2016 for preoperative mapping of her lesion for her planned wide local excision of the left vulva and smaller excision on the right. Operative findings at that time were notable for surgically absent inferior aspect of the left labium minora and majora. Multiple areas with white flaky skin changes were biopsied and the abnormal-appearing tissue. Changes noted in the area of the clitoris and perineum and at the introitus just distal to the hymen. Biopsies were collected on all of the sites.   10/17 Findings: 1. Surgical absent left labia majorum inferiorly from prior vulvectomies.  2. Focusing on the left vulva, there was gross disease incorporating the prior surgical site, characterized by patchy white and erythematous epithelium. The affected area spanned from the perineal body to 5-6cm laterally. Prior mapping biopsy sites x3 were well healed and visible and surrounded by what appeared to be grossly  normal appearing skin. Superiorly on the left labia majorum, there was affected epithelium with a similar appearing patchy white, erythematous epithelium.  3. Focusing of the right vulva, there was less extensive disease. Affected area extended from the perineal body to roughly 3cm laterally with minimal involvement of the right labia majorum.  4. The urethra, anus, and clitoris were spared.  5. A rectal exam was performed during the procedure to confirm protection of the sphincter, which was intact at the conclusion of our case.  6. A rectocele was notable 3cm into the vagina.   Pathology: Final Diagnosis  A: Vulva, right and left, vulvectomy - (Recurrent/persisent) Paget disease  - Disease size approximately 8.3 x 4.6 cm  - Disease confined to the epidermis - Multiple epidermal margins are positive for disease: 6 o'clock to 11 o'clock (per original orientation)  - Deep margin free of involvement   Stage/Disposition: [name] is a 71 y.o. woman with recurrent extramammary Paget's disease.   Interval History: I saw her 2/18. She was seen by urology and had Botox injections. At the time of her last visit her incontinence had improved, her evening voiding had also diminished. I last saw her in June and she stated she was then a follow-up with urology she did feel that the Botox injections did help. She comes in today for follow-up of her extramammary vulvar Paget's disease. She has been followed regular by her urologist. She did receive another Botox injection and then developed some urinary retention for about 400 and bowels. She developed a urinary tract infection as well was placed on sulfa. Her husband's help herself Twice a day and while the post void residual is down still about a 200 mL post void residual each time. She was recently in another 2 week course of Bactrim for UTI. She has follow-up with urology on October 10. She denies any blood in her urine. She has noticed about  a week of vulvar itching. She's not sure of it could be a yeast infection. There is no discharge. She has been steadily gaining weight. She's been off of her diet. I and is no longer walking as much as she had been secondary to the heat. She is drinking about 5 bottles of water a day. She continues to have small which she describes as "pellets" with her stools. She has been taking Dulcolax and while she has some cramping does  not feel like she's able to empty well. She does state that when she was exercising and eating more salads her stools were more along the lines of her normal stools. She previously is use marrow laxative with good results and she will resume that as well as increasing the fiber in her diet.  Current Meds:  Outpatient Encounter Prescriptions as of 02/25/2017  Medication Sig  . albuterol (PROVENTIL HFA;VENTOLIN HFA) 108 (90 BASE) MCG/ACT inhaler Inhale 2 puffs into the lungs every 4 (four) hours as needed for wheezing or shortness of breath. Reported on 08/01/2015  . albuterol (PROVENTIL) (2.5 MG/3ML) 0.083% nebulizer solution Take 2.5 mg by nebulization every 6 (six) hours as needed for wheezing or shortness of breath. Reported on 08/01/2015  . beta carotene w/minerals (OCUVITE) tablet Take 2 tablets by mouth daily.   Marland Kitchen buPROPion (WELLBUTRIN XL) 150 MG 24 hr tablet Take 150 mg by mouth every morning.   . Calcium Carbonate-Vitamin D 600-125 MG-UNIT TABS Take 1 tablet by mouth daily.   . Cholecalciferol (VITAMIN D3) 1000 UNITS CAPS Take 2 capsules by mouth daily.  . citalopram (CELEXA) 40 MG tablet Take 40 mg by mouth every morning.   . clotrimazole-betamethasone (LOTRISONE) cream Apply topically 2 (two) times daily.  Marland Kitchen docusate sodium (COLACE) 100 MG capsule Take 300 mg by mouth daily.   Marland Kitchen levothyroxine (SYNTHROID, LEVOTHROID) 175 MCG tablet TAKE 1 TABLET (175 MCG TOTAL) BY MOUTH DAILY.--  TAKING NAME BRAND SYNTHROID--  TAKES IN AM  . montelukast (SINGULAIR) 10 MG tablet Take 10 mg by  mouth daily as needed.   . Multiple Vitamins-Minerals (MULTIVITAMIN GUMMIES ADULT) CHEW Chew 2 tablets by mouth daily.  Marland Kitchen nystatin cream (MYCOSTATIN) Apply 1 application topically 2 (two) times daily.  . Omega-3 1000 MG CAPS Take 1 g by mouth daily.   Marland Kitchen oxyCODONE (OXY IR/ROXICODONE) 5 MG immediate release tablet Take 1-2 tablets (5-10 mg total) by mouth every 4 (four) hours as needed for severe pain.  . pantoprazole (PROTONIX) 40 MG tablet Take 40 mg by mouth every morning.   . polyvinyl alcohol (LIQUIFILM TEARS) 1.4 % ophthalmic solution Place 1 drop into both eyes as needed for dry eyes.  . silver sulfADIAZINE (SILVADENE) 1 % cream Apply 1 application topically daily.  Marland Kitchen spironolactone (ALDACTONE) 25 MG tablet Take 0.5-1 tablets (12.5-25 mg total) by mouth as needed (for swelling).  . traMADol (ULTRAM) 50 MG tablet Take 1 tablet (50 mg total) by mouth every 6 (six) hours as needed.   No facility-administered encounter medications on file as of 02/25/2017.     Allergy:  Allergies  Allergen Reactions  . Codeine Other (See Comments)    hallucinations Hyper/ Hallucinations   . Demerol [Meperidine] Other (See Comments)    "Knocks me out"  . Diazepam Other (See Comments)    Extreme sedation; patient does not recall taking diazepam (12/07/14)  . Tape Hives and Other (See Comments)    "thick clear plastic tape" causes blisters  . Morphine And Related Itching and Rash    Per CRNA, pt tol Fentanyl IV for OR case, no issues reported to rec RN in PACU, no immediate PACU issues noted as well upon arrival  . Penicillins Hives and Swelling  . Valisone [Betamethasone] Rash    Social Hx:   Social History   Social History  . Marital status: Married    Spouse name: N/A  . Number of children: N/A  . Years of education: N/A   Occupational History  .  Not on file.   Social History Main Topics  . Smoking status: Former Smoker    Packs/day: 2.00    Years: 41.00    Types: Cigarettes    Quit  date: 06/05/1999  . Smokeless tobacco: Never Used  . Alcohol use No  . Drug use: No  . Sexual activity: Not on file   Other Topics Concern  . Not on file   Social History Narrative  . No narrative on file    Past Surgical Hx:  Past Surgical History:  Procedure Laterality Date  . BLADDER SUSPENSION  10/13/2011   Procedure: TRANSVAGINAL TAPE (TVT) PROCEDURE;  Surgeon: Delice Lesch, MD;  Location: [facility];  Service: Gynecology;  Laterality: N/A;  . BREAST BIOPSY Left 03/19/2004  . BREAST LUMPECTOMY Left 03-27-2004  . CARDIAC CATHETERIZATION  10-30-2000  dr Wynonia Lawman   normal coronary arteries and LVF/  ef 65-70%  . CLOSED MANIPULATION  W/ OPEN REDUCTION EXCHANGE TIBIAL FEMORAL BEARING POST TOTAL LEFT KNEE  04-14-2001  . CYSTOSCOPY  10/13/2011   Procedure: CYSTOSCOPY;  Surgeon: Delice Lesch, MD;  Location: [facility];  Service: Gynecology;  Laterality: Bilateral;  . DILATION AND CURETTAGE OF UTERUS  1968  . FOOT SURGERY Left 02-01-2009   TRIPLE ARTHRODESIS  . FOOT SURGERY Left 11/14   I&D left foot with woundvac placement  . INCISIONAL HERNIA REPAIR  05/05/2011   Procedure: LAPAROSCOPIC INCISIONAL HERNIA;  Surgeon: Edward Jolly, MD;  Location: [facility];  Service: General;  Laterality: N/A;  LAPAROSCOPIC REPAIR INCARCERATED INCISIONAL HERNIA  WITH MESH  . KNEE ARTHROSCOPY Bilateral left [phone] & 04-1992/   right [phone]  . LAPAROSCOPIC CHOLECYSTECTOMY  06-21-2010  . LUMBAR SPINE SURGERY  1998   L5 -- S1  . MASTECTOMY Right 1997   W/  LYMPH NODE DISSECTION AND RECONSTRUCTION WITH IMPLANTS AND EXPANDER  . NEGATIVE SLEEP STUDY  12/26/2015  . PORT-A-CATH PLACEMENT AND REMOVAL  1997  . REMOVAL RIGHT BREAST IMPLANT AND CAPSULECTOMY  06-03-1999  . REVISION TOTAL KNEE ARTHROPLASTY Left 07-07-2005  &  12-10-2001   12-10-2001  REMOVAL TOTAL KNEE/ I&D / INSERTION ANTIBIOTIC SPACER  . REVISION TOTAL KNEE ARTHROPLASTY Right 07-07-2005  . ROTATOR CUFF REPAIR Right   . TENDON REPAIR RIGHT  RING FINGER  2011  . THUMB TRIGGER RELEASE Right 1993  . THYROIDECTOMY, PARTIAL  1983  . TONSILLECTOMY  1968  . TOTAL KNEE ARTHROPLASTY Bilateral right 05-04-2000/  left 03-29-2001  . VULVECTOMY  05/27/2012   Procedure: WIDE EXCISION VULVECTOMY;  Surgeon: Imagene Gurney A. Alycia Rossetti, MD;  Location: [facility];  Service: Gynecology;  Laterality: N/A;  Wide Local Excision of Vulva   . VULVECTOMY N/A 11/23/2012   Procedure: WIDE LOCAL EXCISION OF THE VULVA;  Surgeon: Imagene Gurney A. Alycia Rossetti, MD;  Location: [facility];  Service: Gynecology;  Laterality: N/A;  left inferior vulvar biopsy excision left superior lesion left inferior vulvar excision  . VULVECTOMY N/A 08/02/2013   Procedure: WIDE LOCAL  EXCISION VULVA;  Surgeon: Imagene Gurney A. Alycia Rossetti, MD;  Location: [facility];  Service: Gynecology;  Laterality: N/A;  . VULVECTOMY Left 03/29/2014   Procedure: WIDE LOCAL EXCISION OF LEFT VULVA ;  Surgeon: Imagene Gurney A. Alycia Rossetti, MD;  Location: [facility];  Service: Gynecology;  Laterality: Left;  Marland Kitchen VULVECTOMY Bilateral 01/24/2015   Procedure: WIDE LOCAL EXCISION VULVA;  Surgeon: [name], MD;  Location: [facility];  Service: Gynecology;  Laterality: Bilateral;  . [place] PARTIAL  07-16-2010    Past Medical  Hx:  Past Medical History:  Diagnosis Date  . Anxiety   . Arthritis   . Asthma   . Chronic constipation   . Depression   . Fibromyalgia   . GERD (gastroesophageal reflux disease)   . 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 History of colon polyps    2007 hyperplagia  . History of esophageal dilatation    2008  . History of peptic ulcer   . History of small bowel obstruction    2012  . Hypothyroidism   . Macular degeneration of right eye   . Numbness of left foot    4 toes  . Paget's disease of vulva   . Personal history of chemotherapy 1997  .  Personal history of radiation therapy 2005  . PONV (postoperative nausea and vomiting)    and HARD TO WAKE  . Seasonal allergies   . SUI (stress urinary incontinence, female)   . Wears glasses     Family Hx:  Family History  Problem Relation Age of Onset  . Cancer Father 40       lung and kidney   . Alzheimer's disease Mother   . Heart attack Maternal Grandmother   . Alcohol abuse Brother   . Breast cancer Paternal Aunt   . Breast cancer Cousin   . Breast cancer Cousin     Vitals:  Blood pressure 128/65, pulse 76, temperature 97.9 F (36.6 C), temperature source Oral, resp. rate 20, height 5' 5.5" (1.664 m), weight 238 lb 6.4 oz (108.1 kg), SpO2 98 %.  Physical Exam: Well-nourished well-developed female in no acute distress.  Extremities: Marked orthopedic changes with the formation of her left foot. Well-healed surgical incisions from her donor site on the left leg. There was a small area that appeared to have been somewhat inflamed but that is resolved and the incision is well-healed. The vulva is notable for surgical incisions and rotational flap. There is some retraction of the right vulva towards her right leg. There is no evidence of recurrent Paget's and there are no visible lesions. The patient does have a notable rectocele. I believe this may be contributing to her difficulty with defecation.   Assessment/Plan: 71 year old with recurrent vulvar Paget's disease status post definitive surgery with wide local excision and flaps. She did have a positive margin in the vagina however more tissue could not be taken from that area secondary to her rectocele and the vaginal margin.   There is no evidence of recurrent Paget's today.   She will continue to follow up with her urologist. She was sent in a prescription for Diflucan in case she develops a yeast infection. I do not see any evidence of that today. She was encouraged to go back on her diuretics with balance and walking in  an effort to lose some weight.   She will follow-up with her other physicians as scheduled and return to see me in 3-4 months.   [name] A., MD 02/25/2017, 10:02 AM

## 2017-02-25 NOTE — Patient Instructions (Signed)
Return to clinic in 3-4 months.Fluconazole tablets What is this medicine? FLUCONAZOLE (floo KON na zole) is an antifungal medicine. It is used to treat certain kinds of fungal or yeast infections. This medicine may be used for other purposes; ask your health care provider or pharmacist if you have questions. COMMON BRAND NAME(S): Diflucan What should I tell my health care provider before I take this medicine? They need to know if you have any of these conditions: -history of irregular heart beat -kidney disease -an unusual or allergic reaction to fluconazole, other azole antifungals, medicines, foods, dyes, or preservatives -pregnant or trying to get pregnant -breast-feeding How should I use this medicine? Take this medicine by mouth. Follow the directions on the prescription label. Do not take your medicine more often than directed. Talk to your pediatrician regarding the use of this medicine in children. Special care may be needed. This medicine has been used in children as young as 77 months of age. Overdosage: If you think you have taken too much of this medicine contact a poison control center or emergency room at once. NOTE: This medicine is only for you. Do not share this medicine with others. What if I miss a dose? If you miss a dose, take it as soon as you can. If it is almost time for your next dose, take only that dose. Do not take double or extra doses. What may interact with this medicine? Do not take this medicine with any of the following medications: -astemizole -certain medicines for irregular heart beat like dofetilide, dronedarone, quinidine -cisapride -erythromycin -lomitapide -other medicines that prolong the QT interval (cause an abnormal heart rhythm) -pimozide -terfenadine -thioridazine -tolvaptan -ziprasidone This medicine may also interact with the following medications: -antiviral medicines for HIV or AIDS -birth control pills -certain antibiotics like  rifabutin, rifampin -certain medicines for blood pressure like amlodipine, isradipine, felodipine, hydrochlorothiazide, losartan, nifedipine -certain medicines for cancer like cyclophosphamide, vinblastine, vincristine -certain medicines for cholesterol like atorvastatin, lovastatin, fluvastatin, simvastatin -certain medicines for depression, anxiety, or psychotic disturbances like amitriptyline, midazolam, nortriptyline, triazolam -certain medicines for diabetes like glipizide, glyburide, tolbutamide -certain medicines for pain like alfentanil, fentanyl, methadone -certain medicines for seizures like carbamazepine, phenytoin -certain medicines that treat or prevent blood clots like warfarin -halofantrine -medicines that lower your chance of fighting infection like cyclosporine, prednisone, tacrolimus -NSAIDS, medicines for pain and inflammation, like celecoxib, diclofenac, flurbiprofen, ibuprofen, meloxicam, naproxen -other medicines for fungal infections -sirolimus -theophylline -tofacitinib This list may not describe all possible interactions. Give your health care provider a list of all the medicines, herbs, non-prescription drugs, or dietary supplements you use. Also tell them if you smoke, drink alcohol, or use illegal drugs. Some items may interact with your medicine. What should I watch for while using this medicine? Visit your doctor or health care professional for regular checkups. If you are taking this medicine for a long time you may need blood work. Tell your doctor if your symptoms do not improve. Some fungal infections need many weeks or months of treatment to cure. Alcohol can increase possible damage to your liver. Avoid alcoholic drinks. If you have a vaginal infection, do not have sex until you have finished your treatment. You can wear a sanitary napkin. Do not use tampons. Wear freshly washed cotton, not synthetic, panties. What side effects may I notice from receiving this  medicine? Side effects that you should report to your doctor or health care professional as soon as possible: -allergic reactions like skin rash or  itching, hives, swelling of the lips, mouth, tongue, or throat -dark urine -feeling dizzy or faint -irregular heartbeat or chest pain -redness, blistering, peeling or loosening of the skin, including inside the mouth -trouble breathing -unusual bruising or bleeding -vomiting -yellowing of the eyes or skin Side effects that usually do not require medical attention (report to your doctor or health care professional if they continue or are bothersome): -changes in how food tastes -diarrhea -headache -stomach upset or nausea This list may not describe all possible side effects. Call your doctor for medical advice about side effects. You may report side effects to FDA at 1-800-FDA-1088. Where should I keep my medicine? Keep out of the reach of children. Store at room temperature below 30 degrees C (86 degrees F). Throw away any medicine after the expiration date. NOTE: This sheet is a summary. It may not cover all possible information. If you have questions about this medicine, talk to your doctor, pharmacist, or health care provider.  2018 Elsevier/Gold Standard (2012-12-18 19:37:38)

## 2017-03-19 ENCOUNTER — Ambulatory Visit (INDEPENDENT_AMBULATORY_CARE_PROVIDER_SITE_OTHER): Payer: Medicare Other | Admitting: Podiatry

## 2017-03-19 ENCOUNTER — Encounter: Payer: Self-pay | Admitting: Podiatry

## 2017-03-19 ENCOUNTER — Ambulatory Visit (INDEPENDENT_AMBULATORY_CARE_PROVIDER_SITE_OTHER): Payer: Medicare Other

## 2017-03-19 ENCOUNTER — Other Ambulatory Visit: Payer: Self-pay | Admitting: Podiatry

## 2017-03-19 DIAGNOSIS — L84 Corns and callosities: Secondary | ICD-10-CM

## 2017-03-19 DIAGNOSIS — M79674 Pain in right toe(s): Secondary | ICD-10-CM

## 2017-03-19 DIAGNOSIS — L600 Ingrowing nail: Secondary | ICD-10-CM

## 2017-03-19 NOTE — Progress Notes (Signed)
Subjective:    Patient ID: [name], female   DOB: 71 y.o.   MRN: [id number]   HPI patient states that she still has this callus sub-left and she's having trouble with her ingrown toenail on her right big toe    ROS      Objective:  Physical Exam neurovascular status intact with patient found have keratotic lesion sub-left it's painful when pressed with incurvated nail of the right hallux that's painful when palpated     Assessment:  Chronic lesion formation left with ingrown toenail right       Plan:  Reviewed both conditions recommend correction of the ingrown toenail right explaining procedure risk and patient signed consent form. I infiltrated 60 Milligan times like Marcaine mixture remove the nail spicule applied phenol 3 applications 30 seconds followed by alcohol lavaged sterile dressing. Gave instructions on soaks and debrided the lesion left and reappoint for routine care

## 2017-03-19 NOTE — Patient Instructions (Signed)

## 2017-03-26 ENCOUNTER — Ambulatory Visit (INDEPENDENT_AMBULATORY_CARE_PROVIDER_SITE_OTHER): Payer: Medicare Other | Admitting: Orthopedic Surgery

## 2017-03-26 ENCOUNTER — Encounter (INDEPENDENT_AMBULATORY_CARE_PROVIDER_SITE_OTHER): Payer: Self-pay | Admitting: Orthopedic Surgery

## 2017-03-26 ENCOUNTER — Ambulatory Visit (INDEPENDENT_AMBULATORY_CARE_PROVIDER_SITE_OTHER): Payer: Medicare Other

## 2017-03-26 DIAGNOSIS — M25562 Pain in left knee: Secondary | ICD-10-CM

## 2017-03-26 DIAGNOSIS — G8929 Other chronic pain: Secondary | ICD-10-CM | POA: Diagnosis not present

## 2017-03-27 LAB — CBC WITH DIFFERENTIAL/PLATELET
BASOS ABS: 22 {cells}/uL (ref 0–200)
BASOS PCT: 0.5 %
Eosinophils Absolute: 60 cells/uL (ref 15–500)
Eosinophils Relative: 1.4 %
HCT: 38.8 % (ref 35.0–45.0)
Hemoglobin: 13.2 g/dL (ref 11.7–15.5)
Lymphs Abs: 1251 cells/uL (ref 850–3900)
MCH: 29.1 pg (ref 27.0–33.0)
MCHC: 34 g/dL (ref 32.0–36.0)
MCV: 85.7 fL (ref 80.0–100.0)
MONOS PCT: 7 %
MPV: 13 fL — AB (ref 7.5–12.5)
NEUTROS PCT: 62 %
Neutro Abs: 2666 cells/uL (ref 1500–7800)
PLATELETS: 121 10*3/uL — AB (ref 140–400)
RBC: 4.53 10*6/uL (ref 3.80–5.10)
RDW: 13 % (ref 11.0–15.0)
Total Lymphocyte: 29.1 %
WBC: 4.3 10*3/uL (ref 3.8–10.8)
WBCMIX: 301 {cells}/uL (ref 200–950)

## 2017-03-27 LAB — C-REACTIVE PROTEIN: CRP: 2.5 mg/L (ref <8.0)

## 2017-03-27 LAB — SEDIMENTATION RATE: Sed Rate: 2 mm/h (ref 0–30)

## 2017-03-29 NOTE — Progress Notes (Signed)
Office Visit Note   Patient: [name]           Date of Birth: 1946/04/10           MRN: [id number] Visit Date: 03/26/2017 Requested by: Chesley Noon, MD [place], [place] [id number] PCP: Chesley Noon, MD  Subjective: Chief Complaint  Patient presents with  . Left Knee - Pain    HPI: [name] is a 71 year old female with left knee pain.  She reports worsening pain.  States that the pain is constant.  Describes swelling tightness and stiffness in the left knee.  Doesn't probe report some weakness and giving way.  She is using a cane.  Certainly she turns it is painful.  She denies any fever and chills.  She's had multiple left knee surgeries due to left knee infection.  She denies any pain in the right knee or other joints.              ROS: All systems reviewed are negative as they relate to the chief complaint within the history of present illness.  Patient denies  fevers or chills.   Assessment & Plan: Visit Diagnoses:  1. Chronic pain of left knee     Plan: Impression is left knee pain with history of infection in the left knee.  No effusion and warmth in the left knee today but infection would be the primary concern.  Planned CBC differential sedimentation rate C-reactive protein as well as bone scan to evaluate for loosening of [place] back after the studies.  I do not believe that this is a revise a ball knee.  I'll see her back after the studies  Follow-Up Instructions: No Follow-up on file.   Orders:  Orders Placed This Encounter  Procedures  . XR Knee 1-2 Views Left  . NM Bone Scan 3 Phase Lower Extremity  . Sed Rate (ESR)  . C-reactive protein  . CBC with Differential   No orders of the defined types were placed in this encounter.     Procedures: No procedures performed   Clinical Data: No additional findings.  Objective: Vital Signs: There were no vitals taken for this visit.  Physical Exam:   Constitutional: Patient appears  well-developed HEENT:  Head: Normocephalic Eyes:EOM are normal Neck: Normal range of motion Cardiovascular: Normal rate Pulmonary/chest: Effort normal Neurologic: Patient is alert Skin: Skin is warm Psychiatric: Patient has normal mood and affect    Ortho Exam: Orthopedic exam demonstrates full active and passive range of motion of the hip.  No groin pain with internal/external rotation of that leg.  She does have diminished range of motion of the left knee but intact extensor mechanism collaterals are stable.  No warmth or effusion in the left knee is present no lymphadenopathy noted.  Pedal pulses palpable in that left foot.  Multiple surgeries on the left foot had been in performed.  No evidence of any infection in the foot ankle or toe region.  Specialty Comments:  No specialty comments available.  Imaging: No results found.   PMFS History: Patient Active Problem List   Diagnosis Date Noted  . Thrombocytopenic disorder ([place]) 02/25/2017  . Osteoporosis 12/15/2016  . 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 Incomplete emptying of bladder 07/30/2016  . Left foot pain 12/11/2015  . 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 Stopped smoking with greater than 40 pack year history 10/23/2015  . Extramammary Paget's disease  03/30/2012  . Mixed incontinence urge and stress 09/16/2011  . Cancer of right breast, stage 2 ([place]) 05/28/2011  . 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 Hypothyroid 05/28/2011  . GERD (gastroesophageal reflux disease) 05/28/2011  . Fibromyalgia 05/28/2011  . 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 Mild depression ([place]) 05/28/2011  . Thrombocytopenia, unspecified ([place]) 05/28/2011  . Morbid obesity ([place]) 04/10/2011  . GERD 11/09/2009  . ALLERGIC RHINITIS 08/23/2007  . ASTHMA 08/23/2007  . COUGH 08/23/2007  . SKIN CANCER, HX OF 08/23/2007   Past Medical History:  Diagnosis  Date  . Anxiety   . Arthritis   . Asthma   . Chronic constipation   . Depression   . Fibromyalgia   . GERD (gastroesophageal reflux disease)   . 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 History of colon polyps    2007 hyperplagia  . History of esophageal dilatation    2008  . History of peptic ulcer   . History of small bowel obstruction    2012  . Hypothyroidism   . Macular degeneration of right eye   . Numbness of left foot    4 toes  . Paget's disease of vulva   . Personal history of chemotherapy 1997  . Personal history of radiation therapy 2005  . PONV (postoperative nausea and vomiting)    and HARD TO WAKE  . Seasonal allergies   . SUI (stress urinary incontinence, female)   . Wears glasses     Family History  Problem Relation Age of Onset  . Cancer Father 30       lung and kidney   . Alzheimer's disease Mother   . Heart attack Maternal Grandmother   . Alcohol abuse Brother   . Breast cancer Paternal Aunt   . Breast cancer Cousin   . Breast cancer Cousin     Past Surgical History:  Procedure Laterality Date  . BREAST BIOPSY Left 03/19/2004  . BREAST LUMPECTOMY Left 03-27-2004  . CARDIAC CATHETERIZATION  10-30-2000  dr Wynonia Lawman   normal coronary arteries and LVF/  ef 65-70%  . CLOSED MANIPULATION  W/ OPEN REDUCTION EXCHANGE TIBIAL FEMORAL BEARING POST TOTAL LEFT KNEE  04-14-2001  . DILATION AND CURETTAGE OF UTERUS  1968  . FOOT SURGERY Left 02-01-2009   TRIPLE ARTHRODESIS  . FOOT SURGERY Left 11/14   I&D left foot with woundvac placement  . KNEE ARTHROSCOPY Bilateral left 01-1984 & 04-1992/   right [phone]  . LAPAROSCOPIC CHOLECYSTECTOMY  06-21-2010  . LUMBAR SPINE SURGERY  1998   L5 -- S1  . MASTECTOMY Right 1997   W/  LYMPH NODE DISSECTION AND RECONSTRUCTION WITH IMPLANTS AND EXPANDER  . NEGATIVE SLEEP STUDY   12/26/2015  . PORT-A-CATH PLACEMENT AND REMOVAL  1997  . REMOVAL RIGHT BREAST IMPLANT AND CAPSULECTOMY  06-03-1999  . REVISION TOTAL KNEE ARTHROPLASTY Left 07-07-2005  &  12-10-2001   12-10-2001  REMOVAL TOTAL KNEE/ I&D / INSERTION ANTIBIOTIC SPACER  . REVISION TOTAL KNEE ARTHROPLASTY Right 07-07-2005  . ROTATOR CUFF REPAIR Right   . TENDON REPAIR RIGHT RING FINGER  2011  . THUMB TRIGGER RELEASE Right 1993  . THYROIDECTOMY, PARTIAL  1983  . TONSILLECTOMY  1968  . TOTAL KNEE ARTHROPLASTY Bilateral right 05-04-2000/  left 03-29-2001  .  VULVECTOMY PARTIAL  07-16-2010   Social History   Occupational History  . Not on file  Tobacco Use  . Smoking status: Former Smoker    Packs/day: 2.00    Years: 41.00    Pack years: 82.00    Types: Cigarettes    Last attempt to quit: 06/05/1999    Years since quitting: 17.8  . Smokeless tobacco: Never Used  Substance and Sexual Activity  . Alcohol use: No  . Drug use: No  . Sexual activity: Not on file

## 2017-03-31 ENCOUNTER — Telehealth (INDEPENDENT_AMBULATORY_CARE_PROVIDER_SITE_OTHER): Payer: Self-pay | Admitting: Radiology

## 2017-03-31 NOTE — Telephone Encounter (Signed)
Patient called, I told her labs were normal, her platelets usually run low.  What else would you advise for her?   She says that the pain is still the same, in the same spot as it was.

## 2017-03-31 NOTE — Telephone Encounter (Signed)
Wait for bone scan results

## 2017-04-01 ENCOUNTER — Telehealth (INDEPENDENT_AMBULATORY_CARE_PROVIDER_SITE_OTHER): Payer: Self-pay | Admitting: *Deleted

## 2017-04-01 NOTE — Telephone Encounter (Signed)
Pt called back and aware of appt 

## 2017-04-01 NOTE — Telephone Encounter (Signed)
Pt has appt scheduled at [facility] for Bone scan on Wed Nov 14 at 11:00am, pt is to arrive 15 mins early, pt will get the injection at 11 and come back at 2 for scan. Left message for pt to return call for appt information and to schedule follow up with Marlou Sa.

## 2017-04-06 NOTE — Telephone Encounter (Signed)
IC patient and advised.  

## 2017-04-08 ENCOUNTER — Encounter (HOSPITAL_COMMUNITY): Admission: RE | Admit: 2017-04-08 | Payer: Medicare Other | Source: Ambulatory Visit

## 2017-04-08 ENCOUNTER — Encounter (HOSPITAL_COMMUNITY)
Admission: RE | Admit: 2017-04-08 | Discharge: 2017-04-08 | Disposition: A | Discharge status: Home / Self Care | Payer: Medicare Other | Source: Ambulatory Visit | Attending: Orthopedic Surgery | Admitting: Orthopedic Surgery

## 2017-04-08 DIAGNOSIS — G8929 Other chronic pain: Secondary | ICD-10-CM | POA: Insufficient documentation

## 2017-04-08 DIAGNOSIS — M25562 Pain in left knee: Secondary | ICD-10-CM | POA: Insufficient documentation

## 2017-04-08 DIAGNOSIS — R079 Chest pain, unspecified: Secondary | ICD-10-CM | POA: Diagnosis not present

## 2017-04-08 DIAGNOSIS — R0602 Shortness of breath: Secondary | ICD-10-CM | POA: Diagnosis not present

## 2017-04-08 MED ORDER — TECHNETIUM TC 99M MEDRONATE IV KIT
20.0000 | PACK | Freq: Once | INTRAVENOUS | Status: AC | PRN
  Administered 2017-04-08: 20 via INTRAVENOUS

## 2017-04-09 ENCOUNTER — Encounter (HOSPITAL_COMMUNITY): Payer: Self-pay

## 2017-04-09 ENCOUNTER — Inpatient Hospital Stay (HOSPITAL_COMMUNITY)
Admission: EM | Admit: 2017-04-09 | Discharge: 2017-04-13 | DRG: 287 | Disposition: A | Discharge status: Home / Self Care | Payer: Medicare Other | Attending: Internal Medicine | Admitting: Internal Medicine

## 2017-04-09 ENCOUNTER — Emergency Department (HOSPITAL_COMMUNITY): Payer: Medicare Other

## 2017-04-09 ENCOUNTER — Other Ambulatory Visit: Payer: Self-pay

## 2017-04-09 DIAGNOSIS — M1991 Primary osteoarthritis, unspecified site: Secondary | ICD-10-CM | POA: Diagnosis present

## 2017-04-09 DIAGNOSIS — R0602 Shortness of breath: Secondary | ICD-10-CM

## 2017-04-09 DIAGNOSIS — N3020 Other chronic cystitis without hematuria: Secondary | ICD-10-CM

## 2017-04-09 DIAGNOSIS — Z9109 Other allergy status, other than to drugs and biological substances: Secondary | ICD-10-CM

## 2017-04-09 DIAGNOSIS — E039 Hypothyroidism, unspecified: Secondary | ICD-10-CM | POA: Diagnosis present

## 2017-04-09 DIAGNOSIS — Z885 Allergy status to narcotic agent status: Secondary | ICD-10-CM

## 2017-04-09 DIAGNOSIS — Z853 Personal history of malignant neoplasm of breast: Secondary | ICD-10-CM

## 2017-04-09 DIAGNOSIS — I5030 Unspecified diastolic (congestive) heart failure: Secondary | ICD-10-CM | POA: Diagnosis present

## 2017-04-09 DIAGNOSIS — I493 Ventricular premature depolarization: Secondary | ICD-10-CM | POA: Diagnosis present

## 2017-04-09 DIAGNOSIS — F329 Major depressive disorder, single episode, unspecified: Secondary | ICD-10-CM | POA: Diagnosis present

## 2017-04-09 DIAGNOSIS — J302 Other seasonal allergic rhinitis: Secondary | ICD-10-CM | POA: Diagnosis present

## 2017-04-09 DIAGNOSIS — K219 Gastro-esophageal reflux disease without esophagitis: Secondary | ICD-10-CM | POA: Diagnosis present

## 2017-04-09 DIAGNOSIS — Z6839 Body mass index (BMI) 39.0-39.9, adult: Secondary | ICD-10-CM

## 2017-04-09 DIAGNOSIS — R001 Bradycardia, unspecified: Secondary | ICD-10-CM

## 2017-04-09 DIAGNOSIS — F419 Anxiety disorder, unspecified: Secondary | ICD-10-CM | POA: Diagnosis present

## 2017-04-09 DIAGNOSIS — Z9221 Personal history of antineoplastic chemotherapy: Secondary | ICD-10-CM

## 2017-04-09 DIAGNOSIS — Z79899 Other long term (current) drug therapy: Secondary | ICD-10-CM

## 2017-04-09 DIAGNOSIS — Z923 Personal history of irradiation: Secondary | ICD-10-CM

## 2017-04-09 DIAGNOSIS — Z8249 Family history of ischemic heart disease and other diseases of the circulatory system: Secondary | ICD-10-CM

## 2017-04-09 DIAGNOSIS — Z880 Allergy status to penicillin: Secondary | ICD-10-CM

## 2017-04-09 DIAGNOSIS — R008 Other abnormalities of heart beat: Secondary | ICD-10-CM | POA: Diagnosis present

## 2017-04-09 DIAGNOSIS — I499 Cardiac arrhythmia, unspecified: Secondary | ICD-10-CM

## 2017-04-09 DIAGNOSIS — J45909 Unspecified asthma, uncomplicated: Secondary | ICD-10-CM | POA: Diagnosis present

## 2017-04-09 DIAGNOSIS — R0601 Orthopnea: Secondary | ICD-10-CM | POA: Diagnosis present

## 2017-04-09 DIAGNOSIS — N3281 Overactive bladder: Secondary | ICD-10-CM | POA: Diagnosis present

## 2017-04-09 DIAGNOSIS — I358 Other nonrheumatic aortic valve disorders: Secondary | ICD-10-CM | POA: Diagnosis present

## 2017-04-09 DIAGNOSIS — M797 Fibromyalgia: Secondary | ICD-10-CM | POA: Diagnosis present

## 2017-04-09 DIAGNOSIS — Z888 Allergy status to other drugs, medicaments and biological substances status: Secondary | ICD-10-CM

## 2017-04-09 DIAGNOSIS — I249 Acute ischemic heart disease, unspecified: Secondary | ICD-10-CM | POA: Diagnosis present

## 2017-04-09 DIAGNOSIS — I498 Other specified cardiac arrhythmias: Secondary | ICD-10-CM

## 2017-04-09 DIAGNOSIS — R079 Chest pain, unspecified: Principal | ICD-10-CM | POA: Diagnosis present

## 2017-04-09 DIAGNOSIS — Z87891 Personal history of nicotine dependence: Secondary | ICD-10-CM

## 2017-04-09 DIAGNOSIS — Z91048 Other nonmedicinal substance allergy status: Secondary | ICD-10-CM

## 2017-04-09 HISTORY — DX: Other chronic cystitis without hematuria: N30.20

## 2017-04-09 LAB — CBC
HCT: 37.8 % (ref 36.0–46.0)
Hemoglobin: 12.4 g/dL (ref 12.0–15.0)
MCH: 28.8 pg (ref 26.0–34.0)
MCHC: 32.8 g/dL (ref 30.0–36.0)
MCV: 87.7 fL (ref 78.0–100.0)
PLATELETS: 117 10*3/uL — AB (ref 150–400)
RBC: 4.31 MIL/uL (ref 3.87–5.11)
RDW: 13.6 % (ref 11.5–15.5)
WBC: 4.9 10*3/uL (ref 4.0–10.5)

## 2017-04-09 LAB — BASIC METABOLIC PANEL
Anion gap: 5 (ref 5–15)
BUN: 10 mg/dL (ref 6–20)
CHLORIDE: 106 mmol/L (ref 101–111)
CO2: 27 mmol/L (ref 22–32)
CREATININE: 0.56 mg/dL (ref 0.44–1.00)
Calcium: 8.6 mg/dL — ABNORMAL LOW (ref 8.9–10.3)
GFR calc Af Amer: >60 mL/min (ref >60)
GFR calc non Af Amer: >60 mL/min (ref >60)
GLUCOSE: 111 mg/dL — AB (ref 65–99)
Potassium: 3.6 mmol/L (ref 3.5–5.1)
SODIUM: 138 mmol/L (ref 135–145)

## 2017-04-09 LAB — I-STAT TROPONIN, ED: Troponin i, poc: 0 ng/mL (ref 0.00–0.08)

## 2017-04-09 MED ORDER — IOPAMIDOL (ISOVUE-370) INJECTION 76%
INTRAVENOUS | Status: AC
  Administered 2017-04-09: 100 mL
  Filled 2017-04-09: qty 100

## 2017-04-09 MED ORDER — NITROGLYCERIN 0.4 MG SL SUBL
0.4000 mg | SUBLINGUAL_TABLET | Freq: Once | SUBLINGUAL | Status: AC
  Administered 2017-04-09: 0.4 mg via SUBLINGUAL
  Filled 2017-04-09: qty 1

## 2017-04-09 NOTE — ED Triage Notes (Signed)
Pt arrived via [facility] from PCP office c/o SOB and Chest Pain x1 week.  PCP found bigeminy and PVC's. EMS gave 1 SL Nitro 324mg  ASA

## 2017-04-09 NOTE — ED Notes (Signed)
Patient transported to CT 

## 2017-04-09 NOTE — ED Notes (Signed)
Patient transported to X-ray 

## 2017-04-09 NOTE — ED Notes (Signed)
Pt stretcher outside Pod B bathroom. Pt assisted to bathroom by family member. Pt able to ambulate, but with significant assistance. Incr'd SOB with exertion. CP remains 3/10.

## 2017-04-09 NOTE — ED Notes (Signed)
ED Provider at bedside. 

## 2017-04-09 NOTE — ED Provider Notes (Signed)
[facility] EMERGENCY DEPARTMENT Provider Note  CSN: [id number] Arrival date & time: 04/09/17  2024 History   Chief Complaint Chief Complaint  Patient presents with  . Shortness of Breath  . Chest Pain   HPI [name] is a 71 y.o. female who presents with chest pain and shortness of breath that started 1 day ago. The patient states that she noted the symptoms yesterday while walking around.  Patient states that she became extremely short of breath while walking short distances noted chest tightness during these episodes.  Symptoms improved with rest.  Patient went to see her urologist today who found her to be bradycardic and advised to follow-up with her PCP and go to the emergency room.  Patient was then seen by her primary care provider advised patient to come to the emergency department for bradycardia and EKG abnormality's.  Patient has no previous history of CAD or hypertension. No history of heart failure.  States that symptoms are intermittent and only come on with exertion.  She has not had any recent fevers or chills.  Denies any associated cough.  He has not had any lightheadedness dizziness or weakness.  HPI  Past Medical History:  Diagnosis Date  . Anxiety   . Arthritis   . Asthma   . Chronic constipation   . Depression   . Fibromyalgia   . GERD (gastroesophageal reflux disease)   . 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 History of colon polyps    2007 hyperplagia  . History of esophageal dilatation    2008  . History of peptic ulcer   . History of small bowel obstruction    2012  . Hypothyroidism   . Macular degeneration of right eye   . Numbness of left foot    4 toes  . Paget's disease of vulva   . Personal history of chemotherapy 1997  . Personal history of radiation therapy 2005  . PONV  (postoperative nausea and vomiting)    and HARD TO WAKE  . Seasonal allergies   . SUI (stress urinary incontinence, female)   . Wears glasses    Patient Active Problem List   Diagnosis Date Noted  . Thrombocytopenic disorder ([place]) 02/25/2017  . Osteoporosis 12/15/2016  . 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 Incomplete emptying of bladder 07/30/2016  . Left foot pain 12/11/2015  . 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 Stopped smoking with greater than 40 pack year history 10/23/2015  . 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 Mixed incontinence urge and stress 09/16/2011  . Cancer of right breast, stage 2 ([place]) 05/28/2011  . 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 Hypothyroid 05/28/2011  . GERD (gastroesophageal reflux disease) 05/28/2011  . Fibromyalgia 05/28/2011  . 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 Mild depression ([place]) 05/28/2011  . Thrombocytopenia, unspecified ([place]) 05/28/2011  . Morbid obesity ([place]) 04/10/2011  . GERD 11/09/2009  . ALLERGIC RHINITIS 08/23/2007  . ASTHMA 08/23/2007  . COUGH 08/23/2007  . SKIN CANCER, HX OF 08/23/2007    Past Surgical History:  Procedure Laterality Date  . BLADDER SUSPENSION  10/13/2011   Procedure: TRANSVAGINAL TAPE (TVT) PROCEDURE;  Surgeon: Delice Lesch, MD;  Location: [facility];  Service: Gynecology;  Laterality: N/A;  . BREAST BIOPSY Left 03/19/2004  . BREAST LUMPECTOMY Left 03-27-2004  . CARDIAC CATHETERIZATION  10-30-2000  dr Wynonia Lawman   normal coronary arteries and LVF/  ef 65-70%  . CLOSED MANIPULATION  W/ OPEN REDUCTION EXCHANGE TIBIAL FEMORAL BEARING POST TOTAL LEFT KNEE  04-14-2001  . CYSTOSCOPY  10/13/2011   Procedure: CYSTOSCOPY;  Surgeon: Delice Lesch, MD;  Location: [facility];  Service: Gynecology;  Laterality: Bilateral;  . DILATION AND CURETTAGE OF UTERUS  1968  . FOOT SURGERY Left 02-01-2009   TRIPLE ARTHRODESIS  . FOOT  SURGERY Left 11/14   I&D left foot with woundvac placement  . INCISIONAL HERNIA REPAIR  05/05/2011   Procedure: LAPAROSCOPIC INCISIONAL HERNIA;  Surgeon: Edward Jolly, MD;  Location: [facility];  Service: General;  Laterality: N/A;  LAPAROSCOPIC REPAIR INCARCERATED INCISIONAL HERNIA  WITH MESH  . KNEE ARTHROSCOPY Bilateral left 16-1096 & 04-1992/   right [phone]  . LAPAROSCOPIC CHOLECYSTECTOMY  06-21-2010  . LUMBAR SPINE SURGERY  1998   L5 -- S1  . MASTECTOMY Right 1997   W/  LYMPH NODE DISSECTION AND RECONSTRUCTION WITH IMPLANTS AND EXPANDER  . NEGATIVE SLEEP STUDY  12/26/2015  . PORT-A-CATH PLACEMENT AND REMOVAL  1997  . REMOVAL RIGHT BREAST IMPLANT AND CAPSULECTOMY  06-03-1999  . REVISION TOTAL KNEE ARTHROPLASTY Left 07-07-2005  &  12-10-2001   12-10-2001  REMOVAL TOTAL KNEE/ I&D / INSERTION ANTIBIOTIC SPACER  . REVISION TOTAL KNEE ARTHROPLASTY Right 07-07-2005  . ROTATOR CUFF REPAIR Right   . TENDON REPAIR RIGHT RING FINGER  2011  . THUMB TRIGGER RELEASE Right 1993  . THYROIDECTOMY, PARTIAL  1983  . TONSILLECTOMY  1968  . TOTAL KNEE ARTHROPLASTY Bilateral right 05-04-2000/  left 03-29-2001  . VULVECTOMY  05/27/2012   Procedure: WIDE EXCISION VULVECTOMY;  Surgeon: Imagene Gurney A. Alycia Rossetti, MD;  Location: [facility];  Service: Gynecology;  Laterality: N/A;  Wide Local Excision of Vulva   . VULVECTOMY N/A 11/23/2012   Procedure: WIDE LOCAL EXCISION OF THE VULVA;  Surgeon: Imagene Gurney A. Alycia Rossetti, MD;  Location: [facility];  Service: Gynecology;  Laterality: N/A;  left inferior vulvar biopsy excision left superior lesion left inferior vulvar excision  . VULVECTOMY N/A 08/02/2013   Procedure: WIDE LOCAL  EXCISION VULVA;  Surgeon: Imagene Gurney A. Alycia Rossetti, MD;  Location: [facility];  Service: Gynecology;  Laterality: N/A;  . VULVECTOMY Left 03/29/2014   Procedure: WIDE LOCAL EXCISION OF LEFT VULVA ;  Surgeon: Imagene Gurney A. Alycia Rossetti, MD;  Location: [facility];  Service: Gynecology;  Laterality: Left;  [place] VULVECTOMY  Bilateral 01/24/2015   Procedure: WIDE LOCAL EXCISION VULVA;  Surgeon: [name], MD;  Location: [facility];  Service: Gynecology;  Laterality: Bilateral;  . VULVECTOMY PARTIAL  07-16-2010   OB History    Gravida Para Term Preterm AB Living   1 0           SAB TAB Ectopic Multiple Live Births                 Home Medications    Prior to Admission medications   Medication Sig Start Date End Date Taking? Authorizing Provider  albuterol (PROVENTIL HFA;VENTOLIN HFA) 108 (90 BASE) MCG/ACT inhaler Inhale 2 puffs into the lungs every 4 (four) hours as needed for wheezing or shortness of breath. Reported on 08/01/2015   Yes [provider]  buPROPion (WELLBUTRIN XL) 150 MG 24 hr tablet Take 150 mg by mouth every morning.    Yes  [provider]  Calcium Carbonate-Vitamin D 600-125 MG-UNIT TABS Take 1 tablet by mouth daily.    Yes [provider]  Cholecalciferol (VITAMIN D3) 1000 UNITS CAPS Take 2 capsules by mouth daily.   Yes [provider]  citalopram (CELEXA) 40 MG tablet Take 40 mg by mouth every morning.    Yes [provider]  furosemide (LASIX) 20 MG tablet Take 20 mg daily by mouth.   Yes [provider]  ibandronate (BONIVA) 150 MG tablet Take 150 mg every 30 (thirty) days by mouth. Take in the morning with a full glass of water, on an empty stomach, and do not take anything else by mouth or lie down for the next 30 min.   Yes [provider]  levothyroxine (SYNTHROID) 175 MCG tablet TAKE 1 TABLET BY MOUTH EVERY DAY 03/09/17  Yes [provider]  montelukast (SINGULAIR) 10 MG tablet Take 10 mg daily by mouth.  10/03/15  Yes [provider]  Multiple Vitamin (MULTIVITAMIN WITH MINERALS) TABS tablet Take 1 tablet daily by mouth.   Yes [provider]  nystatin cream (MYCOSTATIN) Apply 1 application topically 2 (two) times daily. 07/23/16  Yes [name], MD  Omega-3 1000 MG CAPS Take 2  g daily by mouth.    Yes [provider]  pantoprazole (PROTONIX) 40 MG tablet Take 40 mg by mouth every morning.    Yes [provider]  polyvinyl alcohol (LIQUIFILM TEARS) 1.4 % ophthalmic solution Place 1 drop into both eyes as needed for dry eyes.   Yes [provider]  clotrimazole-betamethasone (LOTRISONE) cream Apply topically 2 (two) times daily. Patient not taking: Reported on 04/09/2017 05/16/15   [name], MD  fluconazole (DIFLUCAN) 200 MG tablet Take 1 tablet (200 mg total) by mouth daily. Patient not taking: Reported on 04/09/2017 02/25/17   [name], MD  oxyCODONE (OXY IR/ROXICODONE) 5 MG immediate release tablet Take 1-2 tablets (5-10 mg total) by mouth every 4 (four) hours as needed for severe pain. Patient not taking: Reported on 04/09/2017 03/14/16   [name] D, NP  silver sulfADIAZINE (SILVADENE) 1 % cream Apply 1 application topically daily. Patient not taking: Reported on 04/09/2017 05/23/16   Wallene Huh, DPM  spironolactone (ALDACTONE) 25 MG tablet Take 0.5-1 tablets (12.5-25 mg total) by mouth as needed (for swelling). Patient not taking: Reported on 04/09/2017 08/31/13   [name] D, NP   Family History Family History  Problem Relation Age of Onset  . Cancer Father 53       lung and kidney   . Alzheimer's disease Mother   . Heart attack Maternal Grandmother   . Alcohol abuse Brother   . Breast cancer Paternal Aunt   . Breast cancer Cousin   . Breast cancer Cousin    Social History Social History   Tobacco Use  . Smoking status: Former Smoker    Packs/day: 2.00    Years: 41.00    Pack years: 82.00    Types: Cigarettes    Last attempt to quit: 06/05/1999    Years since quitting: 17.8  . Smokeless tobacco: Never Used  Substance Use Topics  . Alcohol use: No  . Drug use: No     Allergies   Codeine; Demerol [meperidine]; Diazepam; Tape; Morphine and related; Penicillins; and Valisone  [betamethasone]  Review of Systems Review of Systems  Constitutional: Negative for chills and fever.  HENT: Negative for ear pain and sore throat.   Eyes: Negative  for pain and visual disturbance.  Respiratory: Positive for chest tightness and shortness of breath. Negative for cough.   Cardiovascular: Positive for chest pain and palpitations.  Gastrointestinal: Negative for abdominal pain and vomiting.  Genitourinary: Negative for dysuria and hematuria.  Musculoskeletal: Negative for arthralgias and back pain.  Skin: Negative for color change and rash.  Neurological: Negative for seizures and syncope.  All other systems reviewed and are negative.  Physical Exam Updated Vital Signs BP 111/70   Pulse (!) 42   Temp 98 F (36.7 C) (Oral)   Resp (!) 25   SpO2 94%   Physical Exam  Constitutional: She appears well-developed and well-nourished. No distress.  HENT:  Head: Normocephalic and atraumatic.  Eyes: Conjunctivae and EOM are normal. Pupils are equal, round, and reactive to light.  Neck: Normal range of motion. Neck supple.  Cardiovascular: Regular rhythm and intact distal pulses. Bradycardia present.  No murmur heard. Pulmonary/Chest: Effort normal and breath sounds normal. No respiratory distress.  Abdominal: Soft. There is no tenderness.  Musculoskeletal: Normal range of motion. She exhibits no edema.  Neurological: She is alert.  Skin: Skin is warm and dry.  Psychiatric: She has a normal mood and affect.  Nursing note and vitals reviewed.  ED Treatments / Results  Labs (all labs ordered are listed, but only abnormal results are displayed) Labs Reviewed  BASIC METABOLIC PANEL - Abnormal; Notable for the following components:      Result Value   Glucose, Bld 111 (*)    Calcium 8.6 (*)    All other components within normal limits  CBC - Abnormal; Notable for the following components:   Platelets 117 (*)    All other components within normal limits  I-STAT TROPONIN,  ED   EKG  EKG Interpretation  Date/Time:  Thursday April 09 2017 21:14:42 EST Ventricular Rate:  95 PR Interval:    QRS Duration: 104 QT Interval:  366 QTC Calculation: 378 R Axis:   53 Text Interpretation:  Sinus rhythm Ventricular bigeminy Probable left atrial enlargement Borderline T wave abnormalities ventricular bigeminy Confirmed by Theotis Burrow [phone]) on 04/09/2017 10:27:28 PM      Radiology Dg Chest 2 View  Result Date: 04/09/2017 CLINICAL DATA:  71 year old female with shortness of breath and chest pain for 1 week. EXAM: CHEST  2 VIEW COMPARISON:  10/04/2015 chest radiograph and prior studies FINDINGS: The cardiomediastinal silhouette is unremarkable. Elevation of the right hemidiaphragm again noted. There is no evidence of focal airspace disease, pulmonary edema, suspicious pulmonary nodule/mass, pleural effusion, or pneumothorax. No acute bony abnormalities are identified. IMPRESSION: No evidence of acute cardiopulmonary disease. Electronically Signed   By: Margarette Canada M.D.   On: 04/09/2017 21:52   Nm Bone Scan 3 Phase Lower Extremity  Result Date: 04/09/2017 CLINICAL DATA:  Left Knee pain. History of bilateral knee prosthesis. EXAM: NUCLEAR MEDICINE 3-PHASE BONE SCAN TECHNIQUE: Radionuclide angiographic images, immediate static blood pool images, and 3-hour delayed static images were obtained of the bilateral lower extremities after intravenous injection of radiopharmaceutical. RADIOPHARMACEUTICALS:  20.0 mCi Tc-15m MDP COMPARISON:  Knee radiographs 03/26/2017 FINDINGS: Vascular phase: Asymmetric blood flow to the right lower extremity. Blood pool phase: Asymmetric activity noted around the right tibial prosthesis. Delayed phase: Asymmetric activity noted around the right tibial prosthesis. 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 This is discordant with the patient's pain. I do not see any obvious findings  for loosening involving the left prosthesis.  Electronically Signed   By: Marijo Sanes M.D.   On: 04/09/2017 10:00   Procedures Procedures (including critical care time)  Medications Ordered in ED Medications  iopamidol (ISOVUE-370) 76 % injection (100 mLs  Contrast Given 04/09/17 2246)   Initial Impression / Assessment and Plan / ED Course  I have reviewed the triage vital signs and the nursing notes.  Pertinent labs & imaging results that were available during my care of the patient were reviewed by me and considered in my medical decision making (see chart for details).  GERLDINE SULEIMAN is a 71 y.o. female who presented with chest pain and exertional dyspnea.    EKG I obtained reveals no anatomical ischemia representing STEMI. No concerns for Pericardial Tamponade on EKG and in light of patients hemodynamic stability. No pain related to supine or prone positions and given EKG doubt Pericarditis.   EKG did reveal bigeminy with sinus rate in the 40's and ventricular rate in the 80's  CXR unremarkable for focal airspace disease, patient is afebrile,  cough, and WBC shows no leukocytosis, do not suspect Pneumonia. Without evidence of Pneumothorax. CXR without concern for Esophageal Tear and there is no recent intractable emesis or esophageal instrumentation.  No peritonitis or free air on CXR worrisome for Perforated Abdominal Viscous.  Unable to [facility] the patient and with history of cancer consider the patient to be high probability for possible PE therefore CTA PE ordered which did not reveal a PE  Pain is not described as tearing and does not radiate to back and CTA is negative therefore, doubt Aortic Dissection. Pulses present bilaterally in upper and lower extremities. CXR does not show widened mediastinum.  At this time consider admission for symptomatic bradycardia and chest pain rule out to be reasonable. Discussed this patient care with the hospitalist who will admit the patient.  Cardiology consulted and will see the patient in the hospital.   Final Clinical Impressions(s) / ED Diagnoses   Final diagnoses:  Shortness of breath  Chest pain, unspecified type  Bigeminy  Bradycardia   ED Discharge Orders    None       Fenton Foy, MD 04/10/17 0122    Rex Kras, Wenda Overland, MD 04/10/17 [phone]

## 2017-04-10 ENCOUNTER — Observation Stay (HOSPITAL_COMMUNITY): Payer: Medicare Other

## 2017-04-10 ENCOUNTER — Other Ambulatory Visit: Payer: Self-pay

## 2017-04-10 DIAGNOSIS — E039 Hypothyroidism, unspecified: Secondary | ICD-10-CM

## 2017-04-10 DIAGNOSIS — R079 Chest pain, unspecified: Secondary | ICD-10-CM | POA: Diagnosis present

## 2017-04-10 DIAGNOSIS — I498 Other specified cardiac arrhythmias: Secondary | ICD-10-CM

## 2017-04-10 DIAGNOSIS — R0602 Shortness of breath: Secondary | ICD-10-CM | POA: Diagnosis not present

## 2017-04-10 DIAGNOSIS — I499 Cardiac arrhythmia, unspecified: Secondary | ICD-10-CM | POA: Diagnosis not present

## 2017-04-10 DIAGNOSIS — R001 Bradycardia, unspecified: Secondary | ICD-10-CM | POA: Diagnosis not present

## 2017-04-10 DIAGNOSIS — R002 Palpitations: Secondary | ICD-10-CM

## 2017-04-10 HISTORY — DX: Other specified cardiac arrhythmias: I49.8

## 2017-04-10 HISTORY — DX: Chest pain, unspecified: R07.9

## 2017-04-10 LAB — TROPONIN I
Troponin I: <0.03 ng/mL (ref <0.03)
Troponin I: <0.03 ng/mL (ref <0.03)

## 2017-04-10 LAB — NM MYOCAR MULTI W/SPECT W/WALL MOTION / EF
CHL CUP RESTING HR STRESS: 82 {beats}/min
CSEPHR: 64 %
CSEPPHR: 97 {beats}/min
MPHR: 150 {beats}/min

## 2017-04-10 LAB — BRAIN NATRIURETIC PEPTIDE: B Natriuretic Peptide: 253.8 pg/mL — ABNORMAL HIGH (ref 0.0–100.0)

## 2017-04-10 LAB — PHOSPHORUS: PHOSPHORUS: 3.2 mg/dL (ref 2.5–4.6)

## 2017-04-10 LAB — MAGNESIUM: Magnesium: 1.8 mg/dL (ref 1.7–2.4)

## 2017-04-10 LAB — TSH: TSH: 4.845 u[IU]/mL — AB (ref 0.350–4.500)

## 2017-04-10 MED ORDER — POLYVINYL ALCOHOL 1.4 % OP SOLN: 1.0000 [drp] | OPHTHALMIC | Status: DC | PRN

## 2017-04-10 MED ORDER — PANTOPRAZOLE SODIUM 40 MG PO TBEC
40.0000 mg | DELAYED_RELEASE_TABLET | Freq: Every morning | ORAL | Status: DC
  Administered 2017-04-10 – 2017-04-13 (×4): 40 mg via ORAL
  Filled 2017-04-10 (×4): qty 1

## 2017-04-10 MED ORDER — CALCIUM CARBONATE-VITAMIN D 500-200 MG-UNIT PO TABS
1.0000 | ORAL_TABLET | Freq: Every day | ORAL | Status: DC
  Administered 2017-04-10 – 2017-04-13 (×4): 1 via ORAL
  Filled 2017-04-10 (×4): qty 1

## 2017-04-10 MED ORDER — TECHNETIUM TC 99M TETROFOSMIN IV KIT
30.0000 | PACK | Freq: Once | INTRAVENOUS | Status: AC | PRN
  Administered 2017-04-10: 30 via INTRAVENOUS

## 2017-04-10 MED ORDER — BUPROPION HCL ER (XL) 150 MG PO TB24
150.0000 mg | ORAL_TABLET | Freq: Every morning | ORAL | Status: DC
  Administered 2017-04-10 – 2017-04-13 (×4): 150 mg via ORAL
  Filled 2017-04-10 (×4): qty 1

## 2017-04-10 MED ORDER — METOPROLOL TARTRATE 12.5 MG HALF TABLET
12.5000 mg | ORAL_TABLET | Freq: Two times a day (BID) | ORAL | Status: DC
  Administered 2017-04-10 – 2017-04-13 (×7): 12.5 mg via ORAL
  Filled 2017-04-10 (×7): qty 1

## 2017-04-10 MED ORDER — SODIUM CHLORIDE 0.9 % IV SOLN: 250.0000 mL | INTRAVENOUS | Status: DC | PRN

## 2017-04-10 MED ORDER — NYSTATIN 100000 UNIT/GM EX CREA
1.0000 "application " | TOPICAL_CREAM | Freq: Two times a day (BID) | CUTANEOUS | Status: DC
  Filled 2017-04-10: qty 15

## 2017-04-10 MED ORDER — SODIUM CHLORIDE 0.9% FLUSH
3.0000 mL | Freq: Two times a day (BID) | INTRAVENOUS | Status: DC
Start: 2017-04-10 — End: 2017-04-13
  Administered 2017-04-10 – 2017-04-12 (×3): 3 mL via INTRAVENOUS

## 2017-04-10 MED ORDER — ASPIRIN EC 325 MG PO TBEC
325.0000 mg | DELAYED_RELEASE_TABLET | Freq: Once | ORAL | Status: AC
  Administered 2017-04-10: 325 mg via ORAL
  Filled 2017-04-10: qty 1

## 2017-04-10 MED ORDER — ASPIRIN 81 MG PO CHEW: 81.0000 mg | CHEWABLE_TABLET | ORAL | Status: AC

## 2017-04-10 MED ORDER — TECHNETIUM TC 99M TETROFOSMIN IV KIT
10.0000 | PACK | Freq: Once | INTRAVENOUS | Status: AC | PRN
  Administered 2017-04-10: 10 via INTRAVENOUS

## 2017-04-10 MED ORDER — ADULT MULTIVITAMIN W/MINERALS CH
1.0000 | ORAL_TABLET | Freq: Every day | ORAL | Status: DC
  Administered 2017-04-10 – 2017-04-13 (×4): 1 via ORAL
  Filled 2017-04-10 (×4): qty 1

## 2017-04-10 MED ORDER — ENOXAPARIN SODIUM 40 MG/0.4ML ~~LOC~~ SOLN
40.0000 mg | SUBCUTANEOUS | Status: DC
Start: 2017-04-10 — End: 2017-04-11
  Administered 2017-04-10 – 2017-04-11 (×2): 40 mg via SUBCUTANEOUS
  Filled 2017-04-10 (×2): qty 0.4

## 2017-04-10 MED ORDER — ACETAMINOPHEN 325 MG PO TABS
650.0000 mg | ORAL_TABLET | ORAL | Status: DC | PRN
  Administered 2017-04-10: 650 mg via ORAL
  Filled 2017-04-10: qty 2

## 2017-04-10 MED ORDER — VITAMIN D 1000 UNITS PO TABS
2000.0000 [IU] | ORAL_TABLET | Freq: Every day | ORAL | Status: DC
  Administered 2017-04-10 – 2017-04-13 (×4): 2000 [IU] via ORAL
  Filled 2017-04-10 (×4): qty 2

## 2017-04-10 MED ORDER — MONTELUKAST SODIUM 10 MG PO TABS
10.0000 mg | ORAL_TABLET | Freq: Every day | ORAL | Status: DC
  Administered 2017-04-10 – 2017-04-13 (×4): 10 mg via ORAL
  Filled 2017-04-10 (×4): qty 1

## 2017-04-10 MED ORDER — SODIUM CHLORIDE 0.9% FLUSH: 3.0000 mL | INTRAVENOUS | Status: DC | PRN

## 2017-04-10 MED ORDER — REGADENOSON 0.4 MG/5ML IV SOLN
INTRAVENOUS | Status: AC
  Filled 2017-04-10: qty 5

## 2017-04-10 MED ORDER — LEVOTHYROXINE SODIUM 75 MCG PO TABS
175.0000 ug | ORAL_TABLET | Freq: Every day | ORAL | Status: DC
  Administered 2017-04-10 – 2017-04-13 (×5): 175 ug via ORAL
  Filled 2017-04-10 (×4): qty 1

## 2017-04-10 MED ORDER — NITROFURANTOIN MONOHYD MACRO 100 MG PO CAPS
100.0000 mg | ORAL_CAPSULE | Freq: Every day | ORAL | Status: DC
  Administered 2017-04-10 – 2017-04-13 (×4): 100 mg via ORAL
  Filled 2017-04-10 (×4): qty 1

## 2017-04-10 MED ORDER — CITALOPRAM HYDROBROMIDE 20 MG PO TABS
40.0000 mg | ORAL_TABLET | Freq: Every morning | ORAL | Status: DC
  Administered 2017-04-10 – 2017-04-13 (×4): 40 mg via ORAL
  Filled 2017-04-10 (×4): qty 2

## 2017-04-10 MED ORDER — ALBUTEROL SULFATE (2.5 MG/3ML) 0.083% IN NEBU: 3.0000 mL | INHALATION_SOLUTION | RESPIRATORY_TRACT | Status: DC | PRN

## 2017-04-10 MED ORDER — FUROSEMIDE 20 MG PO TABS: 20.0000 mg | ORAL_TABLET | Freq: Every day | ORAL | Status: DC

## 2017-04-10 MED ORDER — ONDANSETRON HCL 4 MG/2ML IJ SOLN: 4.0000 mg | Freq: Four times a day (QID) | INTRAMUSCULAR | Status: DC | PRN

## 2017-04-10 MED ORDER — SODIUM CHLORIDE 0.9 % WEIGHT BASED INFUSION: 3.0000 mL/kg/h | INTRAVENOUS | Status: AC

## 2017-04-10 MED ORDER — REGADENOSON 0.4 MG/5ML IV SOLN
0.4000 mg | Freq: Once | INTRAVENOUS | Status: AC
  Administered 2017-04-10: 0.4 mg via INTRAVENOUS

## 2017-04-10 MED ORDER — SODIUM CHLORIDE 0.9 % WEIGHT BASED INFUSION: 1.0000 mL/kg/h | INTRAVENOUS | Status: DC

## 2017-04-10 NOTE — Progress Notes (Signed)
   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 EF 42%  Results discussed with Dr. Johnsie Cancel. Plan for [facility] on Monday.   Results and plan discussed with pt. The patient understands that risks included but are not limited to stroke (1 in 1000), death (1 in 61), kidney failure [usually temporary] (1 in 500), bleeding (1 in 200), allergic reaction [possibly serious] (1 in 200). She is agreeable to proceed.   Procedure scheduled for Monday with Dr. Irish Lack.  Daune Perch, AGNP-C [facility] 04/10/2017  6:18 PM Pager: [phone]

## 2017-04-10 NOTE — Consult Note (Signed)
Cardiology Consultation:   Patient ID: [name]; [id number]; 1945/08/21   Admit date: 04/09/2017 Date of Consult: 04/10/2017  Primary Care Provider: Chesley Noon, MD Primary Cardiologist: None Primary Electrophysiologist:  None   Patient Profile:   [name] is a 71 y.o. female with a no significant cardiac  history who is being seen today for the evaluation of PVC's Bigeminy  And atypical precordial chest pain ,  at the request of .Dr Alcario Drought  C/o chest pain , palpitations  History of Present Illness:   [name] is a 71 y.o. female with medical history significant of obesity, former smoker, overactive bladder.   She  presents to the ED today with c/o chest pain for the past 1 week.Mid sternal location , minimal radiation and associated with palpitations over the past 3 days especially.    CP and associated SOB are worse with exertion, better with rest and NTG that was given by EMS and again in ED.    No NV, some associated diaphoresis.    No H/O A.Fib or other cardiac issues, does take lasix PRN for peripheral edema primarily of her LLE that is chronic following a prior knee replacement surgery.  CP is 8/10 at worst.  Noted to have palpable pulse of 45 at urology office visit today (overactive detrusor and UTI) and sent to her PCPs office.  Noted to have numerous PVCs on EKG at PCPs office, and sent to ED.   ED Course: In the ED patient's CP is improved with NTG, her EKG shows predominately a bigeminy rhythm with an electrical rate in the 80s, however her palpable radial pulse is in the low 40s.  She is asymptomatic but this is at rest.  Trop is neg.   She reports that she quit smoking about 17 years ago. Her smoking use included cigarettes. She has a 82.00 pack-year smoking history. she has never used smokeless tobacco. She reports that she does not drink alcohol or use drugs.              Past Medical History:  Diagnosis Date  . Anxiety     . Arthritis   . Asthma   . Chronic constipation   . Depression   . Fibromyalgia   . GERD (gastroesophageal reflux disease)   . 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 History of colon polyps    2007 hyperplagia  . History of esophageal dilatation    2008  . History of peptic ulcer   . History of small bowel obstruction    2012  . Hypothyroidism   . Macular degeneration of right eye   . Numbness of left foot    4 toes  . Paget's disease of vulva   . Personal history of chemotherapy 1997  . Personal history of radiation therapy 2005  . PONV (postoperative nausea and vomiting)    and HARD TO WAKE  . Seasonal allergies   . SUI (stress urinary incontinence, female)   . Wears glasses     Past Surgical History:  Procedure Laterality Date  . BLADDER SUSPENSION  10/13/2011   Procedure: TRANSVAGINAL TAPE (TVT) PROCEDURE;  Surgeon: Delice Lesch, MD;  Location: [facility];  Service: Gynecology;  Laterality: N/A;  . BREAST BIOPSY Left 03/19/2004  . BREAST LUMPECTOMY Left 03-27-2004  .  CARDIAC CATHETERIZATION  10-30-2000  dr Wynonia Lawman   normal coronary arteries and LVF/  ef 65-70%  . CLOSED MANIPULATION  W/ OPEN REDUCTION EXCHANGE TIBIAL FEMORAL BEARING POST TOTAL LEFT KNEE  04-14-2001  . CYSTOSCOPY  10/13/2011   Procedure: CYSTOSCOPY;  Surgeon: Delice Lesch, MD;  Location: [facility];  Service: Gynecology;  Laterality: Bilateral;  . DILATION AND CURETTAGE OF UTERUS  1968  . FOOT SURGERY Left 02-01-2009   TRIPLE ARTHRODESIS  . FOOT SURGERY Left 11/14   I&D left foot with woundvac placement  . INCISIONAL HERNIA REPAIR  05/05/2011   Procedure: LAPAROSCOPIC INCISIONAL HERNIA;  Surgeon: Edward Jolly, MD;  Location: [facility];  Service: General;  Laterality: N/A;  LAPAROSCOPIC REPAIR INCARCERATED INCISIONAL HERNIA  WITH MESH  . KNEE  ARTHROSCOPY Bilateral left [phone] & 04-1992/   right [phone]  . LAPAROSCOPIC CHOLECYSTECTOMY  06-21-2010  . LUMBAR SPINE SURGERY  1998   L5 -- S1  . MASTECTOMY Right 1997   W/  LYMPH NODE DISSECTION AND RECONSTRUCTION WITH IMPLANTS AND EXPANDER  . NEGATIVE SLEEP STUDY  12/26/2015  . PORT-A-CATH PLACEMENT AND REMOVAL  1997  . REMOVAL RIGHT BREAST IMPLANT AND CAPSULECTOMY  06-03-1999  . REVISION TOTAL KNEE ARTHROPLASTY Left 07-07-2005  &  12-10-2001   12-10-2001  REMOVAL TOTAL KNEE/ I&D / INSERTION ANTIBIOTIC SPACER  . REVISION TOTAL KNEE ARTHROPLASTY Right 07-07-2005  . ROTATOR CUFF REPAIR Right   . TENDON REPAIR RIGHT RING FINGER  2011  . THUMB TRIGGER RELEASE Right 1993  . THYROIDECTOMY, PARTIAL  1983  . TONSILLECTOMY  1968  . TOTAL KNEE ARTHROPLASTY Bilateral right 05-04-2000/  left 03-29-2001  . VULVECTOMY  05/27/2012   Procedure: WIDE EXCISION VULVECTOMY;  Surgeon: Imagene Gurney A. Alycia Rossetti, MD;  Location: [facility];  Service: Gynecology;  Laterality: N/A;  Wide Local Excision of Vulva   . VULVECTOMY N/A 11/23/2012   Procedure: WIDE LOCAL EXCISION OF THE VULVA;  Surgeon: Imagene Gurney A. Alycia Rossetti, MD;  Location: [facility];  Service: Gynecology;  Laterality: N/A;  left inferior vulvar biopsy excision left superior lesion left inferior vulvar excision  . VULVECTOMY N/A 08/02/2013   Procedure: WIDE LOCAL  EXCISION VULVA;  Surgeon: Imagene Gurney A. Alycia Rossetti, MD;  Location: [facility];  Service: Gynecology;  Laterality: N/A;  . VULVECTOMY Left 03/29/2014   Procedure: WIDE LOCAL EXCISION OF LEFT VULVA ;  Surgeon: Imagene Gurney A. Alycia Rossetti, MD;  Location: [facility];  Service: Gynecology;  Laterality: Left;  Marland Kitchen VULVECTOMY Bilateral 01/24/2015   Procedure: WIDE LOCAL EXCISION VULVA;  Surgeon: [name], MD;  Location: [facility];  Service: Gynecology;  Laterality: Bilateral;  . VULVECTOMY PARTIAL  07-16-2010     Home Medications:  Prior to Admission medications   Medication Sig Start Date End Date Taking?  Authorizing Provider  albuterol (PROVENTIL HFA;VENTOLIN HFA) 108 (90 BASE) MCG/ACT inhaler Inhale 2 puffs into the lungs every 4 (four) hours as needed for wheezing or shortness of breath. Reported on 08/01/2015   Yes [provider]  buPROPion (WELLBUTRIN XL) 150 MG 24 hr tablet Take 150 mg by mouth every morning.    Yes [provider]  Calcium Carbonate-Vitamin D 600-125 MG-UNIT TABS Take 1 tablet by mouth daily.    Yes [provider]  Cholecalciferol (VITAMIN D3) 1000 UNITS CAPS Take 2 capsules by mouth daily.   Yes [provider]  citalopram (CELEXA) 40 MG tablet Take 40 mg by mouth every morning.    Yes [provider]  furosemide (LASIX) 20 MG tablet Take 20 mg daily by mouth.   Yes [provider]  ibandronate (BONIVA) 150 MG tablet Take 150 mg every 30 (thirty) days by mouth. Take in the morning with a full glass of water, on an empty stomach, and do not take anything else by mouth or lie down for the next 30 min.   Yes [provider]  levothyroxine (SYNTHROID) 175 MCG tablet TAKE 1 TABLET BY MOUTH EVERY DAY 03/09/17  Yes [provider]  montelukast (SINGULAIR) 10 MG tablet Take 10 mg daily by mouth.  10/03/15  Yes [provider]  Multiple Vitamin (MULTIVITAMIN WITH MINERALS) TABS tablet Take 1 tablet daily by mouth.   Yes [provider]  nystatin cream (MYCOSTATIN) Apply 1 application topically 2 (two) times daily. 07/23/16  Yes [name], MD  Omega-3 1000 MG CAPS Take 2 g daily by mouth.    Yes [provider]  pantoprazole (PROTONIX) 40 MG tablet Take 40 mg by mouth every morning.    Yes [provider]  polyvinyl alcohol (LIQUIFILM TEARS) 1.4 % ophthalmic solution Place 1 drop into both eyes as needed for dry eyes.   Yes [provider]    Inpatient Medications: Scheduled Meds: . aspirin EC  325 mg Oral Once  . buPROPion  150 mg Oral q morning - 10a  . Calcium  Carbonate-Vitamin D  1 tablet Oral Daily  . cholecalciferol  2,000 Units Oral Daily  . citalopram  40 mg Oral q morning - 10a  . enoxaparin (LOVENOX) injection  40 mg Subcutaneous Q24H  . levothyroxine  175 mcg Oral Daily  . montelukast  10 mg Oral Daily  . multivitamin with minerals  1 tablet Oral Daily  . nitrofurantoin (macrocrystal-monohydrate)  100 mg Oral Daily  . nystatin cream  1 application Topical BID  . pantoprazole  40 mg Oral q morning - 10a   Continuous Infusions:  PRN Meds: acetaminophen, albuterol, ondansetron (ZOFRAN) IV, polyvinyl alcohol  Allergies:    Allergies  Allergen Reactions  . Codeine Other (See Comments)    hallucinations Hyper/ Hallucinations   . Demerol [Meperidine] Other (See Comments)    "Knocks me out"  . Diazepam Other (See Comments)    Extreme sedation; patient does not recall taking diazepam (12/07/14)  . Tape Hives and Other (See Comments)    "thick clear plastic tape" causes blisters  . Morphine And Related Itching and Rash    Per CRNA, pt tol Fentanyl IV for OR case, no issues reported to rec RN in PACU, no immediate PACU issues noted as well upon arrival  . Penicillins Hives and Swelling  . Valisone [Betamethasone] Rash    Social History:   Social History   Socioeconomic History  . Marital status: Married    Spouse name: Not on file  . Number of children: Not on file  . Years of education: Not on file  . Highest education level: Not on file  Social Needs  . Financial resource strain: Not on file  . Food insecurity - worry: Not on file  . Food insecurity - inability: Not on file  . Transportation needs - medical: Not on file  . Transportation needs - non-medical: Not on file  Occupational History  . Not on file  Tobacco Use  . Smoking status: Former Smoker    Packs/day: 2.00    Years: 41.00    Pack years: 82.00    Types: Cigarettes    Last  attempt to quit: 06/05/1999    Years since quitting: 17.8  . Smokeless tobacco:  Never Used  Substance and Sexual Activity  . Alcohol use: No  . Drug use: No  . Sexual activity: Not on file  Other Topics Concern  . Not on file  Social History Narrative  . Not on file    Family History:    Family History  Problem Relation Age of Onset  . Cancer Father 16       lung and kidney   . Alzheimer's disease Mother   . Heart attack Maternal Grandmother   . Alcohol abuse Brother   . Breast cancer Paternal Aunt   . Breast cancer Cousin   . Breast cancer Cousin      ROS:  Please see the history of present illness.  ROS  All other ROS reviewed and negative, except for chest pain , palpitations.  Physical Exam/Data:   Vitals:   04/10/17 0000 04/10/17 0015 04/10/17 0030 04/10/17 0045  BP: (!) 99/52 114/75 131/65 110/68  Pulse: (!) 36 (!) 43 (!) 42 (!) 40  Resp: (!) 22 17 19 13   Temp:      TempSrc:      SpO2: 94% 94% 92% 92%   No intake or output data in the 24 hours ending 04/10/17 0122 There were no vitals filed for this visit. There is no height or weight on file to calculate BMI.  General:  Well nourished, well developed, in no acute distress HEENT: normal Lymph: no adenopathy Neck: no JVD Endocrine:  No thryomegaly Vascular: No carotid bruits; FA pulses 2+ bilaterally without bruits  Cardiac:  normal S1, S2; RRR; no murmur  Lungs:  clear to auscultation bilaterally, no wheezing, rhonchi or rales  Abd: soft, nontender, no hepatomegaly  Ext: no edema Musculoskeletal:  No deformities, BUE and BLE strength normal and equal Skin: warm and dry  Neuro:  CNs 2-12 intact, no focal abnormalities noted Psych:  Normal affect   EKG:  The EKG was personally reviewed and demonstrates:  NSR, Bigeminy . PVC's  Telemetry:  Telemetry was personally reviewed and demonstrates:  NSR, Bigeminy   Relevant CV Studies: EKG as  Above   Laboratory Data:  Chemistry Recent Labs  Lab 04/09/17 2101  NA 138  K 3.6  CL 106  CO2 27  GLUCOSE 111*  BUN 10  CREATININE  0.56  CALCIUM 8.6*  GFRNONAA >60  GFRAA >60  ANIONGAP 5    No results for input(s): PROT, ALBUMIN, AST, ALT, ALKPHOS, BILITOT in the last 168 hours. Hematology Recent Labs  Lab 04/09/17 2101  WBC 4.9  RBC 4.31  HGB 12.4  HCT 37.8  MCV 87.7  MCH 28.8  MCHC 32.8  RDW 13.6  PLT 117*   Cardiac EnzymesNo results for input(s): TROPONINI in the last 168 hours.  Recent Labs  Lab 04/09/17 2117  TROPIPOC 0.00    BNPNo results for input(s): BNP, PROBNP in the last 168 hours.  DDimer No results for input(s): DDIMER in the last 168 hours.  Radiology/Studies:  Dg Chest 2 View  Result Date: 04/09/2017 CLINICAL DATA:  71 year old female with shortness of breath and chest pain for 1 week. EXAM: CHEST  2 VIEW COMPARISON:  10/04/2015 chest radiograph and prior studies FINDINGS: The cardiomediastinal silhouette is unremarkable. Elevation of the right hemidiaphragm again noted. There is no evidence of focal airspace disease, pulmonary edema, suspicious pulmonary nodule/mass, pleural effusion, or pneumothorax. No acute bony abnormalities are identified. IMPRESSION: No  evidence of acute cardiopulmonary disease. Electronically Signed   By: Margarette Canada M.D.   On: 04/09/2017 21:52   Ct Angio Chest Pe W And/or Wo Contrast  Result Date: 04/09/2017 CLINICAL DATA:  Shortness of breath and chest pain EXAM: CT ANGIOGRAPHY CHEST WITH CONTRAST TECHNIQUE: Multidetector CT imaging of the chest was performed using the standard protocol during bolus administration of intravenous contrast. Multiplanar CT image reconstructions and MIPs were obtained to evaluate the vascular anatomy. CONTRAST:  179mL ISOVUE-370 IOPAMIDOL (ISOVUE-370) INJECTION 76% COMPARISON:  04/09/2017, 11/06/2015 FINDINGS: Cardiovascular: Satisfactory opacification of the pulmonary arteries to the segmental level. No evidence of pulmonary embolism. Mild atherosclerotic calcification of the aorta. No aneurysmal dilatation. Minimal coronary artery  calcification. Mild cardiomegaly. No pericardial effusion. Mediastinum/Nodes: No enlarged mediastinal, hilar, or axillary lymph nodes. Thyroid gland, trachea, and esophagus demonstrate no significant findings. Lungs/Pleura: Lungs are clear. No pleural effusion or pneumothorax. Upper Abdomen: No acute abnormality. Musculoskeletal: Degenerative changes. No acute or suspicious bone lesion. 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 IMPRESSION: Negative for acute pulmonary embolus.  Clear lung fields. Aortic Atherosclerosis (ICD10-I70.0). Electronically Signed   By: Donavan Foil M.D.   On: 04/09/2017 23:08   Nm Bone Scan 3 Phase Lower Extremity  Result Date: 04/09/2017 CLINICAL DATA:  Left Knee pain. History of bilateral knee prosthesis. EXAM: NUCLEAR MEDICINE 3-PHASE BONE SCAN TECHNIQUE: Radionuclide angiographic images, immediate static blood pool images, and 3-hour delayed static images were obtained of the bilateral lower extremities after intravenous injection of radiopharmaceutical. RADIOPHARMACEUTICALS:  20.0 mCi Tc-42m MDP COMPARISON:  Knee radiographs 03/26/2017 FINDINGS: Vascular phase: Asymmetric blood flow to the right lower extremity. Blood pool phase: Asymmetric activity noted around the right tibial prosthesis. Delayed phase: Asymmetric activity noted around the right tibial prosthesis. 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 This is discordant with the patient's pain. I do not see any obvious findings for loosening involving the left prosthesis. Electronically Signed   By: Marijo Sanes M.D.   On: 04/09/2017 10:00    Assessment and Plan:   Chest pain, Precordial pain    Associated  With palpitations, No Obvious ACS  Features on EKG,   We will continue to cycle troponin, observe  Rhythm on tele, Echo in AM to eval for LV  Function.   Cardiology rounding team will eval for  further inpatient or out patient stress  Test  Risk stratification.    EKG-  NSR , Bigeminy , Asymptomatic now .     D/W  Dr Alcario Drought , Appreciate the   Consult        Morbid obesity ([place])    Hypothyroid       Continue home  Meds, TSH thyroid -Per IM care      Ventricular bigeminy- Asymptomatic.    Will await Echo to determine LV function  and overnight observation .         For questions or updates, please contact [place] Please consult [email or web address] for contact info under Cardiology/STEMI.   Signed, Johna Sheriff, MD  04/10/2017 1:22 AM

## 2017-04-10 NOTE — Progress Notes (Signed)
   [name] presented for a nuclear stress test today.  No immediate complications.  Stress imaging is pending at this time.  Preliminary EKG findings may be listed in the chart, but the stress test result will not be finalized until perfusion imaging is complete.  Tami Lin Duke, PA-C 04/10/2017, 12:22 PM

## 2017-04-10 NOTE — Progress Notes (Signed)
PROGRESS NOTE  [name]:[id number] DOB: Apr 02, 1946 DOA: 04/09/2017 PCP: Chesley Noon, MD  HPI/Recap of past 24 hours: [name] is a 71 y.o. female with medical history significant of obesity, former smoker, overactive bladder.  Patient presents to the ED 04/09/17 with c/o chest pain for the past 1 week.  Palpitations over the past 3 days especially. Noted to have palpable pulse of 45 at urology office visit 04/09/17 and sent to her PCPs office.  Noted to have numerous PVCs on EKG at PCPs office, and sent to ED. In the ED patient's CP is improved with NTG, her EKG shows predominately a bigeminy rhythm with an electrical rate in the 80s, however her palpable radial pulse is in the low 40s.  She is asymptomatic.  This morning, patient reports her chest pain and palpitations have resolved. She had a nuclear stress test done awaiting results. We will continue to monitor the patient on telemetry due to unpredictable rhythm.   Assessment/Plan: Principal Problem:   Chest pain, rule out acute myocardial infarction Active Problems:   Morbid obesity ([place])   Hypothyroid   Ventricular bigeminy  Chest pain r/o ACS - Troponin negative x3; EKG ventricular bigeminy in the rate of 95 - nuclear stress done today 04/10/17 awaiting results - chest pain have resolved - cardiology following, we appreciate recommendations. - NPO until cardiology decides otherwise  Ventricular bigeminy -intermittent bradycardia from palpable pulse in the 40's -cardiology following -Cardiology added metoprolol 12.5 mg BID today 04/10/17 -continue close monitoring on telemetry  Anxiety/depression -bupropion, celexa  Hypothyroidism -levothyroxine  GERD -stable -protonix po 40 mg daily   Code Status: Full  Family Communication: With husband at bedside  Disposition Plan: Will stay another midnight to closely monitor unpredictable  rhythm   Consultants:  cardiology  Procedures:  none  Antimicrobials:  Not indicated  DVT prophylaxis:  Lovenox 40 mg sq daily   Objective: Vitals:   04/10/17 0330 04/10/17 0410 04/10/17 0556 04/10/17 0820  BP:  110/60 125/85 (!) 120/53  Pulse: 66 66 76 (!) 40  Resp: 15 15 16    Temp:   98.2 F (36.8 C) 98.8 F (37.1 C)  TempSrc:   Oral Oral  SpO2: 96% 95% 96% 94%  Weight:   107 kg (235 lb 12.8 oz)   Height:   5\' 5"  (1.651 m)    No intake or output data in the 24 hours ending 04/10/17 0832 Filed Weights   04/10/17 0556  Weight: 107 kg (235 lb 12.8 oz)    Exam:   General: 70 year old obese female laying in bed in NAD. A&O x 4.   Cardiovascular: RRR with no murmurs rubs or gallops  Respiratory: CTA with no wheezes rales or rhonchi  Abdomen: Soft obese NT ND with NBS x 4 quadrants   Musculoskeletal: scars noted on knee bilaterally from previous surgeries with deformity noted on left knee  Skin: no noted open sores  Psychiatry: Mood is appropriate for condition and setting   Data Reviewed: CBC: Recent Labs  Lab 04/09/17 2101  WBC 4.9  HGB 12.4  HCT 37.8  MCV 87.7  PLT 683*   Basic Metabolic Panel: Recent Labs  Lab 04/09/17 2101 04/09/17 2338  NA 138  --   K 3.6  --   CL 106  --   CO2 27  --   GLUCOSE 111*  --   BUN 10  --   CREATININE 0.56  --   CALCIUM 8.6*  --  MG  --  1.8  PHOS  --  3.2   GFR: Estimated Creatinine Clearance: 79.5 mL/min (by C-G formula based on SCr of 0.56 mg/dL). Liver Function Tests: No results for input(s): AST, ALT, ALKPHOS, BILITOT, PROT, ALBUMIN in the last 168 hours. No results for input(s): LIPASE, AMYLASE in the last 168 hours. No results for input(s): AMMONIA in the last 168 hours. Coagulation Profile: No results for input(s): INR, PROTIME in the last 168 hours. Cardiac Enzymes: Recent Labs  Lab 04/10/17 0049 04/10/17 0335 04/10/17 0602  TROPONINI <0.03 <0.03 <0.03   BNP (last 3 results) No  results for input(s): PROBNP in the last 8760 hours. HbA1C: No results for input(s): HGBA1C in the last 72 hours. CBG: No results for input(s): GLUCAP in the last 168 hours. Lipid Profile: No results for input(s): CHOL, HDL, LDLCALC, TRIG, CHOLHDL, LDLDIRECT in the last 72 hours. Thyroid Function Tests: Recent Labs    04/10/17 0049  TSH 4.845*   Anemia Panel: No results for input(s): VITAMINB12, FOLATE, FERRITIN, TIBC, IRON, RETICCTPCT in the last 72 hours. Urine analysis:    Component Value Date/Time   COLORURINE YELLOW 05/05/2011 1225   APPEARANCEUR CLEAR 05/05/2011 1225   LABSPEC 1.010 06/15/2013 1534   PHURINE 6.5 06/15/2013 1534   PHURINE 7.0 05/05/2011 1225   GLUCOSEU Negative 06/15/2013 1534   HGBUR Negative 06/15/2013 1534   HGBUR NEGATIVE 05/05/2011 1225   BILIRUBINUR Negative 06/15/2013 1534   KETONESUR 5 06/15/2013 1534   KETONESUR NEGATIVE 05/05/2011 1225   PROTEINUR Negative 06/15/2013 1534   PROTEINUR NEGATIVE 05/05/2011 1225   UROBILINOGEN 0.2 06/15/2013 1534   NITRITE Negative 06/15/2013 1534   NITRITE NEGATIVE 05/05/2011 1225   LEUKOCYTESUR Small 06/15/2013 1534   Sepsis Labs: @LABRCNTIP (procalcitonin:4,lacticidven:4)  )No results found for this or any previous visit (from the past 240 hour(s)).    Studies: Dg Chest 2 View  Result Date: 04/09/2017 CLINICAL DATA:  71 year old female with shortness of breath and chest pain for 1 week. EXAM: CHEST  2 VIEW COMPARISON:  10/04/2015 chest radiograph and prior studies FINDINGS: The cardiomediastinal silhouette is unremarkable. Elevation of the right hemidiaphragm again noted. There is no evidence of focal airspace disease, pulmonary edema, suspicious pulmonary nodule/mass, pleural effusion, or pneumothorax. No acute bony abnormalities are identified. IMPRESSION: No evidence of acute cardiopulmonary disease. Electronically Signed   By: Margarette Canada M.D.   On: 04/09/2017 21:52   Ct Angio Chest Pe W And/or Wo  Contrast  Result Date: 04/09/2017 CLINICAL DATA:  Shortness of breath and chest pain EXAM: CT ANGIOGRAPHY CHEST WITH CONTRAST TECHNIQUE: Multidetector CT imaging of the chest was performed using the standard protocol during bolus administration of intravenous contrast. Multiplanar CT image reconstructions and MIPs were obtained to evaluate the vascular anatomy. CONTRAST:  174mL ISOVUE-370 IOPAMIDOL (ISOVUE-370) INJECTION 76% COMPARISON:  04/09/2017, 11/06/2015 FINDINGS: Cardiovascular: Satisfactory opacification of the pulmonary arteries to the segmental level. No evidence of pulmonary embolism. Mild atherosclerotic calcification of the aorta. No aneurysmal dilatation. Minimal coronary artery calcification. Mild cardiomegaly. No pericardial effusion. Mediastinum/Nodes: No enlarged mediastinal, hilar, or axillary lymph nodes. Thyroid gland, trachea, and esophagus demonstrate no significant findings. Lungs/Pleura: Lungs are clear. No pleural effusion or pneumothorax. Upper Abdomen: No acute abnormality. Musculoskeletal: Degenerative changes. No acute or suspicious bone lesion. 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 IMPRESSION: Negative for acute pulmonary embolus.  Clear lung fields. Aortic Atherosclerosis (ICD10-I70.0). Electronically Signed   By: Madie Reno.D.  On: 04/09/2017 23:08    Scheduled Meds: . buPROPion  150 mg Oral q morning - 10a  . calcium-vitamin D  1 tablet Oral Daily  . cholecalciferol  2,000 Units Oral Daily  . citalopram  40 mg Oral q morning - 10a  . enoxaparin (LOVENOX) injection  40 mg Subcutaneous Q24H  . levothyroxine  175 mcg Oral QAC breakfast  . montelukast  10 mg Oral Daily  . multivitamin with minerals  1 tablet Oral Daily  . nitrofurantoin (macrocrystal-monohydrate)  100 mg Oral Daily  . nystatin cream  1 application Topical BID  . pantoprazole  40 mg Oral q morning - 10a    Continuous Infusions:   LOS: 0 days      Kayleen Memos, MD [facility] Pager [phone]  If 7PM-7AM, please contact night-coverage [email or web address] Password [facility] 04/10/2017, 8:32 AM

## 2017-04-10 NOTE — Progress Notes (Signed)
Progress Note  Patient Name: [name] Date of Encounter: 04/10/2017  Primary Cardiologist: New- Dr. Johnsie Cancel  Subjective   Pt with no chest pain or dyspnea. She is tearful in discussing her recent stress with repeated knee surgeries and husband with illness and job loss. States that she has been having exertional chest pressure and DOE with walking long distances like through [employer] for 1 week.   Inpatient Medications    Scheduled Meds: . buPROPion  150 mg Oral q morning - 10a  . calcium-vitamin D  1 tablet Oral Daily  . cholecalciferol  2,000 Units Oral Daily  . citalopram  40 mg Oral q morning - 10a  . enoxaparin (LOVENOX) injection  40 mg Subcutaneous Q24H  . levothyroxine  175 mcg Oral QAC breakfast  . montelukast  10 mg Oral Daily  . multivitamin with minerals  1 tablet Oral Daily  . nitrofurantoin (macrocrystal-monohydrate)  100 mg Oral Daily  . nystatin cream  1 application Topical BID  . pantoprazole  40 mg Oral q morning - 10a   Continuous Infusions:  PRN Meds: acetaminophen, albuterol, ondansetron (ZOFRAN) IV, polyvinyl alcohol   Vital Signs    Vitals:   04/10/17 0330 04/10/17 0410 04/10/17 0556 04/10/17 0820  BP:  110/60 125/85 (!) 120/53  Pulse: 66 66 76 (!) 40  Resp: 15 15 16    Temp:   98.2 F (36.8 C) 98.8 F (37.1 C)  TempSrc:   Oral Oral  SpO2: 96% 95% 96% 94%  Weight:   235 lb 12.8 oz (107 kg)   Height:   5\' 5"  (1.651 m)    No intake or output data in the 24 hours ending 04/10/17 0829 Filed Weights   04/10/17 0556  Weight: 235 lb 12.8 oz (107 kg)    Telemetry    Sinus rhythm with intermittent frequent PVCs and bigeminy - Personally Reviewed  ECG    No new tracing - Personally Reviewed  Physical Exam  BP (!) 120/53 (BP Location: Left Arm)   Pulse (!) 40   Temp 98.8 F (37.1 C) (Oral)   Resp 16   Ht 5\' 5"  (1.651 m)   Wt 235 lb 12.8 oz (107 kg)   SpO2 94%   BMI 39.24 kg/m   GEN: Obese, elderly female. No acute distress.     Neck:  +JVD Cardiac: Irregular rhythm, no murmurs, rubs, or gallops.  Respiratory: Clear to auscultation bilaterally. GI: Soft, nontender, non-distended  MS: Puffy edema in left lower leg related to repeated left knee surgeries; Left leg deformed with multiple surgical scars.  Neuro:  Nonfocal  Psych: Tearful  Labs    Chemistry Recent Labs  Lab 04/09/17 2101  NA 138  K 3.6  CL 106  CO2 27  GLUCOSE 111*  BUN 10  CREATININE 0.56  CALCIUM 8.6*  GFRNONAA >60  GFRAA >60  ANIONGAP 5     Hematology Recent Labs  Lab 04/09/17 2101  WBC 4.9  RBC 4.31  HGB 12.4  HCT 37.8  MCV 87.7  MCH 28.8  MCHC 32.8  RDW 13.6  PLT 117*    Cardiac Enzymes Recent Labs  Lab 04/10/17 0049 04/10/17 0335 04/10/17 0602  TROPONINI <0.03 <0.03 <0.03    Recent Labs  Lab 04/09/17 2117  TROPIPOC 0.00     BNP Recent Labs  Lab 04/09/17 2101  BNP 253.8*     DDimer No results for input(s): DDIMER in the last 168 hours.   Radiology  Dg Chest 2 View  Result Date: 04/09/2017 CLINICAL DATA:  71 year old female with shortness of breath and chest pain for 1 week. EXAM: CHEST  2 VIEW COMPARISON:  10/04/2015 chest radiograph and prior studies FINDINGS: The cardiomediastinal silhouette is unremarkable. Elevation of the right hemidiaphragm again noted. There is no evidence of focal airspace disease, pulmonary edema, suspicious pulmonary nodule/mass, pleural effusion, or pneumothorax. No acute bony abnormalities are identified. IMPRESSION: No evidence of acute cardiopulmonary disease. Electronically Signed   By: Margarette Canada M.D.   On: 04/09/2017 21:52   Ct Angio Chest Pe W And/or Wo Contrast  Result Date: 04/09/2017 CLINICAL DATA:  Shortness of breath and chest pain EXAM: CT ANGIOGRAPHY CHEST WITH CONTRAST TECHNIQUE: Multidetector CT imaging of the chest was performed using the standard protocol during bolus administration of intravenous contrast. Multiplanar CT image reconstructions and  MIPs were obtained to evaluate the vascular anatomy. CONTRAST:  123mL ISOVUE-370 IOPAMIDOL (ISOVUE-370) INJECTION 76% COMPARISON:  04/09/2017, 11/06/2015 FINDINGS: Cardiovascular: Satisfactory opacification of the pulmonary arteries to the segmental level. No evidence of pulmonary embolism. Mild atherosclerotic calcification of the aorta. No aneurysmal dilatation. Minimal coronary artery calcification. Mild cardiomegaly. No pericardial effusion. Mediastinum/Nodes: No enlarged mediastinal, hilar, or axillary lymph nodes. Thyroid gland, trachea, and esophagus demonstrate no significant findings. Lungs/Pleura: Lungs are clear. No pleural effusion or pneumothorax. Upper Abdomen: No acute abnormality. Musculoskeletal: Degenerative changes. No acute or suspicious bone lesion. 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 IMPRESSION: Negative for acute pulmonary embolus.  Clear lung fields. Aortic Atherosclerosis (ICD10-I70.0). Electronically Signed   By: Donavan Foil M.D.   On: 04/09/2017 23:08   Nm Bone Scan 3 Phase Lower Extremity  Result Date: 04/09/2017 CLINICAL DATA:  Left Knee pain. History of bilateral knee prosthesis. EXAM: NUCLEAR MEDICINE 3-PHASE BONE SCAN TECHNIQUE: Radionuclide angiographic images, immediate static blood pool images, and 3-hour delayed static images were obtained of the bilateral lower extremities after intravenous injection of radiopharmaceutical. RADIOPHARMACEUTICALS:  20.0 mCi Tc-87m MDP COMPARISON:  Knee radiographs 03/26/2017 FINDINGS: Vascular phase: Asymmetric blood flow to the right lower extremity. Blood pool phase: Asymmetric activity noted around the right tibial prosthesis. Delayed phase: Asymmetric activity noted around the right tibial prosthesis. 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 This is discordant with the patient's pain. I do not see any obvious findings  for loosening involving the left prosthesis. Electronically Signed   By: Marijo Sanes M.D.   On: 04/09/2017 10:00    Cardiac Studies   Echo pending  Patient Profile     71 y.o. female with history significant for obesity, former smoker, overactive bladder, hypothyroid, GERD, Asthma, depression, fibromyalgia and no previous cardiac history who presented for evaluation of palpitations -PVC's/bigeminy and atypical precordial chest pain. Was noted to  Have pulse of 45 at office visit and EKG showed numerous PVCs, no ischemic changes. BNP 253.8. CTA negative for PE. Minimal coronary artery calcification on CTA. Electrolytes within normal limits.   Assessment & Plan    Chest pain: Associated with palpitations, frequent PVC's. She's been having symptoms of exertional chest pressure and dyspnea for ~1 week and palpitations, new for her. Troponins negative X 4. EKG without ischemic changes, frequent PVC's. Pt is NPO. Will arrange for [facility] today, unable to walk on treadmill due to significant left knee issues. Will check echocardiogram.   Dyspnea on exertion: Recent DOE for the last week, orthopnea. No significant increase  in edema (has L lower leg edema r/t left knee mult surgeries). BNP 253.8. No pulmonary edema on CXR. Will assess echo for LV function.   PVC's: Awaiting echo to assess LV function and valves.   For questions or updates, please contact [place] Please consult [email or web address] for contact info under Cardiology/STEMI.      Signed, Daune Perch, NP  04/10/2017, 8:29 AM    Patient examined chart reviewed Obese female lungs clear no murmur trace edema post TKR. She has asthma only in winter when she heats house with wood. Agree with need for myovue to r/o CAD and echo to assess EF start low dose beta blocker for PVCls   [employer]

## 2017-04-10 NOTE — Plan of Care (Signed)
Pt endorses good appetite. Consumes 75-100% of meals. Educated on cardiac diet and importance of complying with prescribed diet regimen.

## 2017-04-10 NOTE — Care Management Obs Status (Signed)
[place] NOTIFICATION   Patient Details  Name: [name] MRN: [id number] Date of Birth: 1945-07-23   Medicare Observation Status Notification Given:  Yes    Arley Phenix, RN 04/10/2017, 4:17 PM

## 2017-04-10 NOTE — H&P (Signed)
History and Physical    [name] RCV:[id number] DOB: 08-27-1945 DOA: 04/09/2017  PCP: Chesley Noon, MD  Patient coming from: Home  I have personally briefly reviewed patient's old medical records in [place]  Chief Complaint: Chest pain  HPI: [name] is a 71 y.o. female with medical history significant of obesity, former smoker, overactive bladder.  Patient presents to the ED today with c/o chest pain for the past 1 week.  Palpitations over the past 3 days especially.  CP and associated SOB are worse with exertion, better with rest and NTG that was given by EMS and again in ED.  No NV, some associated diaphoresis.  No H/O A.Fib or other cardiac issues, does take lasix PRN for peripheral edema primarily of her LLE that is chronic following a prior knee replacement surgery.  CP is 8/10 at worst.  Noted to have palpable pulse of 45 at urology office visit today (overactive detrusor and UTI) and sent to her PCPs office.  Noted to have numerous PVCs on EKG at PCPs office, and sent to ED.   ED Course: In the ED patient's CP is improved with NTG, her EKG shows predominately a bigeminy rhythm with an electrical rate in the 80s, however her palpable radial pulse is in the low 40s.  She is asymptomatic but this is at rest.  Trop is neg.   Review of Systems: As per HPI otherwise 10 point review of systems negative.   Past Medical History:  Diagnosis Date  . Anxiety   . Arthritis   . Asthma   . Chronic constipation   . Depression   . Fibromyalgia   . GERD (gastroesophageal reflux disease)   . 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 History of colon polyps    2007 hyperplagia  . History of esophageal dilatation    2008  . History of peptic ulcer   . History of small bowel obstruction    2012  . Hypothyroidism   .  Macular degeneration of right eye   . Numbness of left foot    4 toes  . Paget's disease of vulva   . Personal history of chemotherapy 1997  . Personal history of radiation therapy 2005  . PONV (postoperative nausea and vomiting)    and HARD TO WAKE  . Seasonal allergies   . SUI (stress urinary incontinence, female)   . Wears glasses     Past Surgical History:  Procedure Laterality Date  . BLADDER SUSPENSION  10/13/2011   Procedure: TRANSVAGINAL TAPE (TVT) PROCEDURE;  Surgeon: Delice Lesch, MD;  Location: [facility];  Service: Gynecology;  Laterality: N/A;  . BREAST BIOPSY Left 03/19/2004  . BREAST LUMPECTOMY Left 03-27-2004  . CARDIAC CATHETERIZATION  10-30-2000  dr Wynonia Lawman   normal coronary arteries and LVF/  ef 65-70%  . CLOSED MANIPULATION  W/ OPEN REDUCTION EXCHANGE TIBIAL FEMORAL BEARING POST TOTAL LEFT KNEE  04-14-2001  . CYSTOSCOPY  10/13/2011   Procedure: CYSTOSCOPY;  Surgeon: Delice Lesch, MD;  Location: [facility];  Service: Gynecology;  Laterality: Bilateral;  . DILATION AND CURETTAGE OF UTERUS  1968  . FOOT SURGERY Left 02-01-2009   TRIPLE ARTHRODESIS  . FOOT SURGERY Left 11/14   I&D left foot with woundvac placement  . [place]  05/05/2011   Procedure: LAPAROSCOPIC INCISIONAL HERNIA;  Surgeon: Edward Jolly, MD;  Location: [facility];  Service: General;  Laterality: N/A;  LAPAROSCOPIC REPAIR INCARCERATED INCISIONAL HERNIA  WITH MESH  . KNEE ARTHROSCOPY Bilateral left [phone] & 04-1992/   right [phone]  . LAPAROSCOPIC CHOLECYSTECTOMY  06-21-2010  . LUMBAR SPINE SURGERY  1998   L5 -- S1  . MASTECTOMY Right 1997   W/  LYMPH NODE DISSECTION AND RECONSTRUCTION WITH IMPLANTS AND EXPANDER  . NEGATIVE SLEEP STUDY  12/26/2015  . PORT-A-CATH PLACEMENT AND REMOVAL  1997  . REMOVAL RIGHT BREAST IMPLANT AND CAPSULECTOMY  06-03-1999  . REVISION TOTAL KNEE ARTHROPLASTY Left 07-07-2005  &  12-10-2001   12-10-2001  REMOVAL TOTAL KNEE/ I&D / INSERTION ANTIBIOTIC  SPACER  . REVISION TOTAL KNEE ARTHROPLASTY Right 07-07-2005  . ROTATOR CUFF REPAIR Right   . TENDON REPAIR RIGHT RING FINGER  2011  . THUMB TRIGGER RELEASE Right 1993  . THYROIDECTOMY, PARTIAL  1983  . TONSILLECTOMY  1968  . TOTAL KNEE ARTHROPLASTY Bilateral right 05-04-2000/  left 03-29-2001  . VULVECTOMY  05/27/2012   Procedure: WIDE EXCISION VULVECTOMY;  Surgeon: Imagene Gurney A. Alycia Rossetti, MD;  Location: [facility];  Service: Gynecology;  Laterality: N/A;  Wide Local Excision of Vulva   . VULVECTOMY N/A 11/23/2012   Procedure: WIDE LOCAL EXCISION OF THE VULVA;  Surgeon: Imagene Gurney A. Alycia Rossetti, MD;  Location: [facility];  Service: Gynecology;  Laterality: N/A;  left inferior vulvar biopsy excision left superior lesion left inferior vulvar excision  . VULVECTOMY N/A 08/02/2013   Procedure: WIDE LOCAL  EXCISION VULVA;  Surgeon: Imagene Gurney A. Alycia Rossetti, MD;  Location: [facility];  Service: Gynecology;  Laterality: N/A;  . VULVECTOMY Left 03/29/2014   Procedure: WIDE LOCAL EXCISION OF LEFT VULVA ;  Surgeon: Imagene Gurney A. Alycia Rossetti, MD;  Location: [facility];  Service: Gynecology;  Laterality: Left;  Marland Kitchen VULVECTOMY Bilateral 01/24/2015   Procedure: WIDE LOCAL EXCISION VULVA;  Surgeon: [name], MD;  Location: [facility];  Service: Gynecology;  Laterality: Bilateral;  . VULVECTOMY PARTIAL  07-16-2010     reports that she quit smoking about 17 years ago. Her smoking use included cigarettes. She has a 82.00 pack-year smoking history. she has never used smokeless tobacco. She reports that she does not drink alcohol or use drugs.  Allergies  Allergen Reactions  . Codeine Other (See Comments)    hallucinations Hyper/ Hallucinations   . Demerol [Meperidine] Other (See Comments)    "Knocks me out"  . Diazepam Other (See Comments)    Extreme sedation; patient does not recall taking diazepam (12/07/14)  . Tape Hives and Other (See Comments)    "thick clear plastic tape" causes blisters  . Morphine And Related  Itching and Rash    Per CRNA, pt tol Fentanyl IV for OR case, no issues reported to rec RN in PACU, no immediate PACU issues noted as well upon arrival  . Penicillins Hives and Swelling  . Valisone [Betamethasone] Rash    Family History  Problem Relation Age of Onset  . Cancer Father 4       lung and kidney   . Alzheimer's disease Mother   . Heart attack Maternal Grandmother   . Alcohol abuse Brother   . Breast cancer Paternal Aunt   . Breast cancer Cousin   . Breast cancer Cousin      Prior to Admission medications   Medication Sig Start Date End Date Taking? Authorizing Provider  albuterol (  PROVENTIL HFA;VENTOLIN HFA) 108 (90 BASE) MCG/ACT inhaler Inhale 2 puffs into the lungs every 4 (four) hours as needed for wheezing or shortness of breath. Reported on 08/01/2015   Yes [provider]  buPROPion (WELLBUTRIN XL) 150 MG 24 hr tablet Take 150 mg by mouth every morning.    Yes [provider]  Calcium Carbonate-Vitamin D 600-125 MG-UNIT TABS Take 1 tablet by mouth daily.    Yes [provider]  Cholecalciferol (VITAMIN D3) 1000 UNITS CAPS Take 2 capsules by mouth daily.   Yes [provider]  citalopram (CELEXA) 40 MG tablet Take 40 mg by mouth every morning.    Yes [provider]  furosemide (LASIX) 20 MG tablet Take 20 mg daily by mouth.   Yes [provider]  ibandronate (BONIVA) 150 MG tablet Take 150 mg every 30 (thirty) days by mouth. Take in the morning with a full glass of water, on an empty stomach, and do not take anything else by mouth or lie down for the next 30 min.   Yes [provider]  levothyroxine (SYNTHROID) 175 MCG tablet TAKE 1 TABLET BY MOUTH EVERY DAY 03/09/17  Yes [provider]  montelukast (SINGULAIR) 10 MG tablet Take 10 mg daily by mouth.  10/03/15  Yes [provider]  Multiple Vitamin (MULTIVITAMIN WITH MINERALS) TABS tablet Take 1 tablet daily by mouth.   Yes [provider]  nystatin cream (MYCOSTATIN) Apply 1 application topically 2 (two) times daily. 07/23/16  Yes [name], MD  Omega-3 1000 MG CAPS Take 2 g daily by mouth.    Yes [provider]  pantoprazole (PROTONIX) 40 MG tablet Take 40 mg by mouth every morning.    Yes [provider]  polyvinyl alcohol (LIQUIFILM TEARS) 1.4 % ophthalmic solution Place 1 drop into both eyes as needed for dry eyes.   Yes [provider]    Physical Exam: Vitals:   04/09/17 2100 04/09/17 2309 04/09/17 2315 04/09/17 2330  BP: 111/70 136/64 133/61 (!) 114/52  Pulse: (!) 42 (!) 45 (!) 44 (!) 42  Resp: (!) 25 18 (!) 21 19  Temp:      TempSrc:      SpO2: 94% 95% 94% 94%    Constitutional: NAD, calm, comfortable Eyes: PERRL, lids and conjunctivae normal ENMT: Mucous membranes are moist. Posterior pharynx clear of any exudate or lesions.Normal dentition.  Neck: normal, supple, no masses, no thyromegaly Respiratory: clear to auscultation bilaterally, no wheezing, no crackles. Normal respiratory effort. No accessory muscle use.  Cardiovascular: Radial pulse in the low 40s, audible pulse also in the 40s, I may be able to hear a very quiet "3rd sound" which may represent the PVC beat  Abdomen: no tenderness, no masses palpated. No hepatosplenomegaly. Bowel sounds positive.  Musculoskeletal: no clubbing / cyanosis. No joint deformity upper and lower extremities. Good ROM, no contractures. Normal muscle tone.  Skin: no rashes, lesions, ulcers. No induration Neurologic: CN 2-12 grossly intact. Sensation intact, DTR normal. Strength 5/5 in all 4.  Psychiatric: Normal judgment and insight. Alert and oriented x 3. Normal mood.    Labs on Admission: I have personally reviewed following labs and imaging studies  CBC: Recent Labs  Lab 04/09/17 2101  WBC 4.9  HGB 12.4  HCT 37.8  MCV 87.7  PLT 627*   Basic Metabolic Panel: Recent Labs  Lab 04/09/17 2101  NA 138  K 3.6  CL 106   CO2 27  GLUCOSE 111*  BUN 10  CREATININE 0.56  CALCIUM 8.6*   GFR: CrCl cannot be calculated (Unknown ideal weight.). Liver Function Tests: No results for input(s): AST, ALT, ALKPHOS, BILITOT, PROT, ALBUMIN in the last 168 hours. No results for input(s): LIPASE, AMYLASE in the last 168 hours. No results for input(s): AMMONIA in the last 168 hours. Coagulation Profile: No results for input(s): INR, PROTIME in the last 168 hours. Cardiac Enzymes: No results for input(s): CKTOTAL, CKMB, CKMBINDEX, TROPONINI in the last 168 hours. BNP (last 3 results) No results for input(s): PROBNP in the last 8760 hours. HbA1C: No results for input(s): HGBA1C in the last 72 hours. CBG: No results for input(s): GLUCAP in the last 168 hours. Lipid Profile: No results for input(s): CHOL, HDL, LDLCALC, TRIG, CHOLHDL, LDLDIRECT in the last 72 hours. Thyroid Function Tests: No results for input(s): TSH, T4TOTAL, FREET4, T3FREE, THYROIDAB in the last 72 hours. Anemia Panel: No results for input(s): VITAMINB12, FOLATE, FERRITIN, TIBC, IRON, RETICCTPCT in the last 72 hours. Urine analysis:    Component Value Date/Time   COLORURINE YELLOW 05/05/2011 1225   APPEARANCEUR CLEAR 05/05/2011 1225   LABSPEC 1.010 06/15/2013 1534   PHURINE 6.5 06/15/2013 1534   PHURINE 7.0 05/05/2011 1225   GLUCOSEU Negative 06/15/2013 1534   HGBUR Negative 06/15/2013 1534   HGBUR NEGATIVE 05/05/2011 1225   BILIRUBINUR Negative 06/15/2013 1534   KETONESUR 5 06/15/2013 1534   KETONESUR NEGATIVE 05/05/2011 1225   PROTEINUR Negative 06/15/2013 1534   PROTEINUR NEGATIVE 05/05/2011 1225   UROBILINOGEN 0.2 06/15/2013 1534   NITRITE Negative 06/15/2013 1534   NITRITE NEGATIVE 05/05/2011 1225   LEUKOCYTESUR Small 06/15/2013 1534    Radiological Exams on Admission: Dg Chest 2 View  Result Date: 04/09/2017 CLINICAL DATA:  71 year old female with shortness of breath and chest pain for 1 week. EXAM: CHEST  2 VIEW COMPARISON:   10/04/2015 chest radiograph and prior studies FINDINGS: The cardiomediastinal silhouette is unremarkable. Elevation of the right hemidiaphragm again noted. There is no evidence of focal airspace disease, pulmonary edema, suspicious pulmonary nodule/mass, pleural effusion, or pneumothorax. No acute bony abnormalities are identified. IMPRESSION: No evidence of acute cardiopulmonary disease. Electronically Signed   By: Margarette Canada M.D.   On: 04/09/2017 21:52   Ct Angio Chest Pe W And/or Wo Contrast  Result Date: 04/09/2017 CLINICAL DATA:  Shortness of breath and chest pain EXAM: CT ANGIOGRAPHY CHEST WITH CONTRAST TECHNIQUE: Multidetector CT imaging of the chest was performed using the standard protocol during bolus administration of intravenous contrast. Multiplanar CT image reconstructions and MIPs were obtained to evaluate the vascular anatomy. CONTRAST:  120mL ISOVUE-370 IOPAMIDOL (ISOVUE-370) INJECTION 76% COMPARISON:  04/09/2017, 11/06/2015 FINDINGS: Cardiovascular: Satisfactory opacification of the pulmonary arteries to the segmental level. No evidence of pulmonary embolism. Mild atherosclerotic calcification of the aorta. No aneurysmal dilatation. Minimal coronary artery calcification. Mild cardiomegaly. No pericardial effusion. Mediastinum/Nodes: No enlarged mediastinal, hilar, or axillary lymph nodes. Thyroid gland, trachea, and esophagus demonstrate no significant findings. Lungs/Pleura: Lungs are clear. No pleural effusion or pneumothorax. Upper Abdomen: No acute abnormality. Musculoskeletal: Degenerative changes. No acute or suspicious bone lesion. 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 IMPRESSION: Negative for acute pulmonary embolus.  Clear lung fields. Aortic Atherosclerosis (ICD10-I70.0). Electronically Signed   By: Donavan Foil M.D.   On: 04/09/2017 23:08   Nm Bone Scan 3 Phase Lower Extremity  Result Date: 04/09/2017 CLINICAL DATA:  Left  Knee pain. History of bilateral knee prosthesis. EXAM:  NUCLEAR MEDICINE 3-PHASE BONE SCAN TECHNIQUE: Radionuclide angiographic images, immediate static blood pool images, and 3-hour delayed static images were obtained of the bilateral lower extremities after intravenous injection of radiopharmaceutical. RADIOPHARMACEUTICALS:  20.0 mCi Tc-13m MDP COMPARISON:  Knee radiographs 03/26/2017 FINDINGS: Vascular phase: Asymmetric blood flow to the right lower extremity. Blood pool phase: Asymmetric activity noted around the right tibial prosthesis. Delayed phase: Asymmetric activity noted around the right tibial prosthesis. 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 This is discordant with the patient's pain. I do not see any obvious findings for loosening involving the left prosthesis. Electronically Signed   By: Marijo Sanes M.D.   On: 04/09/2017 10:00    EKG: Independently reviewed.  Assessment/Plan Principal Problem:   Chest pain, rule out acute myocardial infarction Active Problems:   Morbid obesity ([place])   Hypothyroid   Ventricular bigeminy    1. Chest pain - 1. CP obs pathway 2. Serial trops 3. Tele monitor 4. NPO 5. Cards to see in AM 2. Ventricular bigeminy - 1. Unclear significance but palpable pulse is bradycardic 2. Cards to see in AM 3. Not on any beta blockers 3. Hypothyroid - check TSH  DVT prophylaxis: Lovenox Code Status: Full Family Communication: Family at bedside Disposition Plan: Home after admit Consults called: Cards Admission status: Place in obs    [place], [place] [facility] Pager [phone]  If 7AM-7PM, please contact day team taking care of patient [email or web address] Password [facility]  04/10/2017, 12:27 AM

## 2017-04-11 ENCOUNTER — Observation Stay (HOSPITAL_BASED_OUTPATIENT_CLINIC_OR_DEPARTMENT_OTHER): Payer: Medicare Other

## 2017-04-11 DIAGNOSIS — M797 Fibromyalgia: Secondary | ICD-10-CM | POA: Diagnosis present

## 2017-04-11 DIAGNOSIS — I493 Ventricular premature depolarization: Secondary | ICD-10-CM | POA: Diagnosis present

## 2017-04-11 DIAGNOSIS — I200 Unstable angina: Secondary | ICD-10-CM | POA: Diagnosis not present

## 2017-04-11 DIAGNOSIS — Z853 Personal history of malignant neoplasm of breast: Secondary | ICD-10-CM | POA: Diagnosis not present

## 2017-04-11 DIAGNOSIS — Z880 Allergy status to penicillin: Secondary | ICD-10-CM | POA: Diagnosis not present

## 2017-04-11 DIAGNOSIS — E039 Hypothyroidism, unspecified: Secondary | ICD-10-CM | POA: Diagnosis present

## 2017-04-11 DIAGNOSIS — I499 Cardiac arrhythmia, unspecified: Secondary | ICD-10-CM | POA: Diagnosis not present

## 2017-04-11 DIAGNOSIS — M1991 Primary osteoarthritis, unspecified site: Secondary | ICD-10-CM | POA: Diagnosis present

## 2017-04-11 DIAGNOSIS — R0601 Orthopnea: Secondary | ICD-10-CM | POA: Diagnosis present

## 2017-04-11 DIAGNOSIS — I519 Heart disease, unspecified: Secondary | ICD-10-CM | POA: Diagnosis not present

## 2017-04-11 DIAGNOSIS — R0602 Shortness of breath: Secondary | ICD-10-CM | POA: Diagnosis present

## 2017-04-11 DIAGNOSIS — R079 Chest pain, unspecified: Secondary | ICD-10-CM | POA: Diagnosis present

## 2017-04-11 DIAGNOSIS — Z885 Allergy status to narcotic agent status: Secondary | ICD-10-CM | POA: Diagnosis not present

## 2017-04-11 DIAGNOSIS — I361 Nonrheumatic tricuspid (valve) insufficiency: Secondary | ICD-10-CM

## 2017-04-11 DIAGNOSIS — Z888 Allergy status to other drugs, medicaments and biological substances status: Secondary | ICD-10-CM | POA: Diagnosis not present

## 2017-04-11 DIAGNOSIS — Z87891 Personal history of nicotine dependence: Secondary | ICD-10-CM | POA: Diagnosis not present

## 2017-04-11 DIAGNOSIS — Z923 Personal history of irradiation: Secondary | ICD-10-CM | POA: Diagnosis not present

## 2017-04-11 DIAGNOSIS — R008 Other abnormalities of heart beat: Secondary | ICD-10-CM | POA: Diagnosis present

## 2017-04-11 DIAGNOSIS — N3281 Overactive bladder: Secondary | ICD-10-CM | POA: Diagnosis present

## 2017-04-11 DIAGNOSIS — K219 Gastro-esophageal reflux disease without esophagitis: Secondary | ICD-10-CM | POA: Diagnosis present

## 2017-04-11 DIAGNOSIS — R001 Bradycardia, unspecified: Secondary | ICD-10-CM | POA: Diagnosis present

## 2017-04-11 DIAGNOSIS — I358 Other nonrheumatic aortic valve disorders: Secondary | ICD-10-CM | POA: Diagnosis present

## 2017-04-11 DIAGNOSIS — J302 Other seasonal allergic rhinitis: Secondary | ICD-10-CM | POA: Diagnosis present

## 2017-04-11 DIAGNOSIS — I5030 Unspecified diastolic (congestive) heart failure: Secondary | ICD-10-CM | POA: Diagnosis present

## 2017-04-11 DIAGNOSIS — I249 Acute ischemic heart disease, unspecified: Secondary | ICD-10-CM | POA: Diagnosis present

## 2017-04-11 DIAGNOSIS — Z79899 Other long term (current) drug therapy: Secondary | ICD-10-CM | POA: Diagnosis not present

## 2017-04-11 DIAGNOSIS — F329 Major depressive disorder, single episode, unspecified: Secondary | ICD-10-CM | POA: Diagnosis present

## 2017-04-11 DIAGNOSIS — Z6839 Body mass index (BMI) 39.0-39.9, adult: Secondary | ICD-10-CM | POA: Diagnosis not present

## 2017-04-11 DIAGNOSIS — F419 Anxiety disorder, unspecified: Secondary | ICD-10-CM | POA: Diagnosis present

## 2017-04-11 HISTORY — DX: Acute ischemic heart disease, unspecified: I24.9

## 2017-04-11 LAB — ECHOCARDIOGRAM COMPLETE
Height: 65 in
WEIGHTICAEL: 3800 [oz_av]

## 2017-04-11 LAB — HEPARIN LEVEL (UNFRACTIONATED): HEPARIN UNFRACTIONATED: 0.38 [IU]/mL (ref 0.30–0.70)

## 2017-04-11 MED ORDER — HEPARIN BOLUS VIA INFUSION
4000.0000 [IU] | Freq: Once | INTRAVENOUS | Status: AC
  Administered 2017-04-11: 4000 [IU] via INTRAVENOUS
  Filled 2017-04-11: qty 4000

## 2017-04-11 MED ORDER — HEPARIN (PORCINE) IN NACL 100-0.45 UNIT/ML-% IJ SOLN: 10.0000 [IU]/kg/h | INTRAMUSCULAR | Status: DC

## 2017-04-11 MED ORDER — HEPARIN (PORCINE) IN NACL 100-0.45 UNIT/ML-% IJ SOLN
1300.0000 [IU]/h | INTRAMUSCULAR | Status: DC
  Administered 2017-04-11 – 2017-04-12 (×2): 1000 [IU]/h via INTRAVENOUS
  Administered 2017-04-13: 1300 [IU]/h via INTRAVENOUS
  Filled 2017-04-11 (×3): qty 250

## 2017-04-11 NOTE — Progress Notes (Signed)
ANTICOAGULATION CONSULT NOTE - Initial Consult  Pharmacy Consult for Heparin Indication: chest pain/ACS  Allergies  Allergen Reactions  . Codeine Other (See Comments)    hallucinations Hyper/ Hallucinations   . Demerol [Meperidine] Other (See Comments)    "Knocks me out"  . Diazepam Other (See Comments)    Extreme sedation; patient does not recall taking diazepam (12/07/14)  . Tape Hives and Other (See Comments)    "thick clear plastic tape" causes blisters  . Morphine And Related Itching and Rash    Per CRNA, pt tol Fentanyl IV for OR case, no issues reported to rec RN in PACU, no immediate PACU issues noted as well upon arrival  . Penicillins Hives and Swelling  . Valisone [Betamethasone] Rash    Patient Measurements: Height: 5\' 5"  (165.1 cm) Weight: 237 lb 8 oz (107.7 kg) IBW/kg (Calculated) : 57 Heparin Dosing Weight: 86kg (calculated)  Vital Signs: Temp: 98.9 F (37.2 C) (11/17 0356) Temp Source: Oral (11/17 0356) BP: 140/67 (11/17 0356) Pulse Rate: 61 (11/17 0500)  Labs: Recent Labs    04/09/17 2101 04/10/17 0049 04/10/17 0335 04/10/17 0602  HGB 12.4  --   --   --   HCT 37.8  --   --   --   PLT 117*  --   --   --   CREATININE 0.56  --   --   --   TROPONINI  --  <0.03 <0.03 <0.03    Estimated Creatinine Clearance: 79.8 mL/min (by C-G formula based on SCr of 0.56 mg/dL).   Medical History: Past Medical History:  Diagnosis Date  . Anxiety   . Arthritis   . Asthma   . Chronic constipation   . Depression   . Fibromyalgia   . GERD (gastroesophageal reflux disease)   . 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 History of colon polyps    2007 hyperplagia  . History of esophageal dilatation    2008  . History of peptic ulcer   . History of small bowel obstruction    2012  . Hypothyroidism   . Macular  degeneration of right eye   . Numbness of left foot    4 toes  . Paget's disease of vulva   . Personal history of chemotherapy 1997  . Personal history of radiation therapy 2005  . PONV (postoperative nausea and vomiting)    and HARD TO WAKE  . Seasonal allergies   . SUI (stress urinary incontinence, female)   . Wears glasses     Assessment: 71 yo F PMHx obesity, former smoker, overactive bladder who presented with chest pain with palpitations over last 3 days with worsening SOB. Patient had prior knee replacement of left leg that she takes PRN lasix for her peripheral edema. Per MD noted to have numerous PVCs on EKG at PCPs office, and sent to ED. Patient also has suspected moderate sized infarct involving the left ventricular Apex with ischemia, and EF 42% per MD. Plan for [facility] Monday. Pharmacy consulted to dose heparin. CBC stable, plts slightly low, trops negative.   Goal of Therapy:  Heparin level 0.3-0.7 units/ml Monitor platelets by anticoagulation protocol: Yes   Plan:  Give 4000 units bolus x 1 Start heparin infusion at 1000 units/hr  6 hour heparin level Daily heparin level, CBC Monitor clinical  course, s/sx bleeding  Nida Boatman, PharmD PGY1 Acute Care Pharmacy Resident Pager: [phone] 04/11/2017,1:00 PM

## 2017-04-11 NOTE — Plan of Care (Signed)
Patient remains pain-free tonight

## 2017-04-11 NOTE — Progress Notes (Signed)
[place] for Heparin Indication: chest pain/ACS  Allergies  Allergen Reactions  . Codeine Other (See Comments)    hallucinations Hyper/ Hallucinations   . Demerol [Meperidine] Other (See Comments)    "Knocks me out"  . Diazepam Other (See Comments)    Extreme sedation; patient does not recall taking diazepam (12/07/14)  . Tape Hives and Other (See Comments)    "thick clear plastic tape" causes blisters  . Morphine And Related Itching and Rash    Per CRNA, pt tol Fentanyl IV for OR case, no issues reported to rec RN in PACU, no immediate PACU issues noted as well upon arrival  . Penicillins Hives and Swelling  . Valisone [Betamethasone] Rash    Patient Measurements: Height: 5\' 5"  (165.1 cm) Weight: 237 lb 8 oz (107.7 kg) IBW/kg (Calculated) : 57 Heparin Dosing Weight: 86kg (calculated)  Vital Signs: Temp: 98.1 F (36.7 C) (11/17 1456) Temp Source: Oral (11/17 1456) BP: 117/52 (11/17 1456) Pulse Rate: 45 (11/17 1456)  Labs: Recent Labs    04/09/17 2101 04/10/17 0049 04/10/17 0335 04/10/17 0602 04/11/17 1834  HGB 12.4  --   --   --   --   HCT 37.8  --   --   --   --   PLT 117*  --   --   --   --   HEPARINUNFRC  --   --   --   --  0.38  CREATININE 0.56  --   --   --   --   TROPONINI  --  <0.03 <0.03 <0.03  --     Estimated Creatinine Clearance: 79.8 mL/min (by C-G formula based on SCr of 0.56 mg/dL).   Medical History: Past Medical History:  Diagnosis Date  . Anxiety   . Arthritis   . Asthma   . Chronic constipation   . Depression   . Fibromyalgia   . GERD (gastroesophageal reflux disease)   . 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 History of colon polyps    2007 hyperplagia  . History of esophageal dilatation    2008  . History of peptic ulcer   . History of small  bowel obstruction    2012  . Hypothyroidism   . Macular degeneration of right eye   . Numbness of left foot    4 toes  . Paget's disease of vulva   . Personal history of chemotherapy 1997  . Personal history of radiation therapy 2005  . PONV (postoperative nausea and vomiting)    and HARD TO WAKE  . Seasonal allergies   . SUI (stress urinary incontinence, female)   . Wears glasses     Assessment: 71 yo F continuing on heparin per Pharmacy for ACS. Not on anticoagulation PTA. Plan for [facility] Monday. CBC stable. No bleed documented.  Initial heparin level therapeutic.  Goal of Therapy:  Heparin level 0.3-0.7 units/ml Monitor platelets by anticoagulation protocol: Yes   Plan:  Continue heparin infusion at 1000 units/hr  Confirmatory heparin level with AM labs Daily heparin level, CBC Monitor for s/sx bleeding  Elicia Lamp, PharmD, BCPS Clinical Pharmacist 04/11/2017 7:57 PM

## 2017-04-11 NOTE — Progress Notes (Signed)
PROGRESS NOTE  [name] DGU:[id number] DOB: 13-Jan-1946 DOA: 04/09/2017 PCP: Chesley Noon, MD  HPI/Recap of past 24 hours: [name] is a 71 y.o. female with medical history significant of obesity, former smoker, overactive bladder.  Patient presents to the ED 04/09/17 with c/o chest pain for the past 1 week.  Palpitations over the past 3 days especially. Noted to have palpable pulse of 45 at urology office visit 04/09/17 and sent to her PCPs office.  Noted to have numerous PVCs on EKG at PCPs office, and sent to ED. In the ED patient's CP is improved with NTG, her EKG shows predominately a bigeminy rhythm with an electrical rate in the 80s, however her palpable radial pulse is in the low 40s.  She is asymptomatic.  No acute events overnight. This morning, patient denies chest pain, dyspnea or palpitations.  Post nuclear stress test day#1 with suspicion for moderate sized infarct affecting left ventricle apex. On heparin drip. Cardiology planning for left heart cath on Monday.  Assessment/Plan: Principal Problem:   Chest pain, rule out acute myocardial infarction Active Problems:   Morbid obesity ([place])   Hypothyroid   Bigeminy  Chest pain r/o ACS - chest pain has resolved - Troponin negative x3; EKG ventricular bigeminy in the rate of 95 - nuclear stress done 04/10/17 with suspicion for moderate sized infarct affecting left ventricle apex.  - On heparin drip.  - Pharmacy team management of heparin is appreciated - Cardiology planning for left heart cath on Monday 04/13/17. - cardiology following, we appreciate recommendations. - NPO after midnight on Sunday 04/12/17  Ventricular bigeminy -intermittent bradycardia from palpable pulse in the 40's -cardiology following -Cardiology added metoprolol 12.5 mg BID 04/10/17 -continue close monitoring on telemetry  Anxiety/depression -bupropion, celexa  Hypothyroidism -levothyroxine  GERD -stable -protonix po 40 mg  daily   Code Status: Full  Family Communication: With husband at bedside  Disposition Plan: Will stay another midnight to continue heparin drip, closely monitoring of cardiac rhythm for anticipation of left cardiac cath on Monday 04/13/17   Consultants:  cardiology  Procedures:  none  Antimicrobials:  Not indicated  DVT prophylaxis:  Heparin drip for anticipate left cardiac cath on Monday 04/13/17   Objective: Vitals:   04/10/17 2152 04/11/17 0356 04/11/17 0500 04/11/17 1456  BP: (!) 152/72 140/67  (!) 117/52  Pulse: 73 74 61 (!) 45  Resp:  (!) 31 14   Temp: 99 F (37.2 C) 98.9 F (37.2 C)  98.1 F (36.7 C)  TempSrc: Oral Oral  Oral  SpO2: 92%  95% 96%  Weight:  107.7 kg (237 lb 8 oz)    Height:        Intake/Output Summary (Last 24 hours) at 04/11/2017 1506 Last data filed at 04/11/2017 0845 Gross per 24 hour  Intake 1580 ml  Output 1400 ml  Net 180 ml   Filed Weights   04/10/17 0556 04/11/17 0356  Weight: 107 kg (235 lb 12.8 oz) 107.7 kg (237 lb 8 oz)    Exam:   General: 71 year old obese female laying in bed in NAD. A&O x 4.   Cardiovascular: RRR with no murmurs rubs or gallops  Respiratory: CTA with no wheezes rales or rhonchi  Abdomen: Soft obese NT ND with NBS x 4 quadrants   Musculoskeletal: scars noted on knee bilaterally from previous surgeries with deformity noted on left knee  Skin: no noted open sores  Psychiatry: Mood is appropriate for condition and  setting   Data Reviewed: CBC: Recent Labs  Lab 04/09/17 2101  WBC 4.9  HGB 12.4  HCT 37.8  MCV 87.7  PLT 810*   Basic Metabolic Panel: Recent Labs  Lab 04/09/17 2101 04/09/17 2338  NA 138  --   K 3.6  --   CL 106  --   CO2 27  --   GLUCOSE 111*  --   BUN 10  --   CREATININE 0.56  --   CALCIUM 8.6*  --   MG  --  1.8  PHOS  --  3.2   GFR: Estimated Creatinine Clearance: 79.8 mL/min (by C-G formula based on SCr of 0.56 mg/dL). Liver Function Tests: No results  for input(s): AST, ALT, ALKPHOS, BILITOT, PROT, ALBUMIN in the last 168 hours. No results for input(s): LIPASE, AMYLASE in the last 168 hours. No results for input(s): AMMONIA in the last 168 hours. Coagulation Profile: No results for input(s): INR, PROTIME in the last 168 hours. Cardiac Enzymes: Recent Labs  Lab 04/10/17 0049 04/10/17 0335 04/10/17 0602  TROPONINI <0.03 <0.03 <0.03   BNP (last 3 results) No results for input(s): PROBNP in the last 8760 hours. HbA1C: No results for input(s): HGBA1C in the last 72 hours. CBG: No results for input(s): GLUCAP in the last 168 hours. Lipid Profile: No results for input(s): CHOL, HDL, LDLCALC, TRIG, CHOLHDL, LDLDIRECT in the last 72 hours. Thyroid Function Tests: Recent Labs    04/10/17 0049  TSH 4.845*   Anemia Panel: No results for input(s): VITAMINB12, FOLATE, FERRITIN, TIBC, IRON, RETICCTPCT in the last 72 hours. Urine analysis:    Component Value Date/Time   COLORURINE YELLOW 05/05/2011 [place] 05/05/2011 1225   LABSPEC 1.010 06/15/2013 1534   PHURINE 6.5 06/15/2013 1534   PHURINE 7.0 05/05/2011 1225   GLUCOSEU Negative 06/15/2013 1534   HGBUR Negative 06/15/2013 1534   HGBUR NEGATIVE 05/05/2011 1225   BILIRUBINUR Negative 06/15/2013 1534   KETONESUR 5 06/15/2013 1534   KETONESUR NEGATIVE 05/05/2011 1225   PROTEINUR Negative 06/15/2013 1534   PROTEINUR NEGATIVE 05/05/2011 1225   UROBILINOGEN 0.2 06/15/2013 1534   NITRITE Negative 06/15/2013 1534   NITRITE NEGATIVE 05/05/2011 1225   LEUKOCYTESUR Small 06/15/2013 1534   Sepsis Labs: @LABRCNTIP (procalcitonin:4,lacticidven:4)  )No results found for this or any previous visit (from the past 240 hour(s)).    Studies: No results found.  Scheduled Meds: . aspirin  81 mg Oral Pre-Cath  . buPROPion  150 mg Oral q morning - 10a  . calcium-vitamin D  1 tablet Oral Daily  . cholecalciferol  2,000 Units Oral Daily  . citalopram  40 mg Oral q morning  - 10a  . heparin  4,000 Units Intravenous Once  . levothyroxine  175 mcg Oral QAC breakfast  . metoprolol tartrate  12.5 mg Oral BID  . montelukast  10 mg Oral Daily  . multivitamin with minerals  1 tablet Oral Daily  . nitrofurantoin (macrocrystal-monohydrate)  100 mg Oral Daily  . nystatin cream  1 application Topical BID  . pantoprazole  40 mg Oral q morning - 10a  . sodium chloride flush  3 mL Intravenous Q12H    Continuous Infusions: . sodium chloride    . sodium chloride    . heparin       LOS: 0 days     Kayleen Memos, MD [facility] Pager [phone]  If 7PM-7AM, please contact night-coverage [email or web address] Password [email or web address] 04/11/2017, 3:06 PM

## 2017-04-11 NOTE — Progress Notes (Signed)
DAILY PROGRESS NOTE   Patient Name: [name] Date of Encounter: 04/11/2017  Chief Complaint   No chest pain  Patient Profile   71 y.o. female with history significant for obesity, former smoker, overactive bladder, hypothyroid, GERD, Asthma, depression, fibromyalgia and no previous cardiac history who presented for evaluation of palpitations -PVC's/bigeminy and atypical precordial chest pain. Was noted to  Have pulse of 45 at office visit and EKG showed numerous PVCs, no ischemic changes. BNP 253.8. CTA negative for PE. Minimal coronary artery calcification on CTA. Electrolytes within normal limits.   Subjective   No chest pain overnight. Plans for [facility] on Monday.  Objective   Vitals:   04/10/17 1805 04/10/17 2152 04/11/17 0356 04/11/17 0500  BP: (!) 118/52 (!) 152/72 140/67   Pulse:  73 74 61  Resp:   (!) 31 14  Temp: 99.2 F (37.3 C) 99 F (37.2 C) 98.9 F (37.2 C)   TempSrc: Oral Oral Oral   SpO2: 94% 92%  95%  Weight:   237 lb 8 oz (107.7 kg)   Height:        Intake/Output Summary (Last 24 hours) at 04/11/2017 1255 Last data filed at 04/11/2017 0845 Gross per 24 hour  Intake 1580 ml  Output 1400 ml  Net 180 ml   Filed Weights   04/10/17 0556 04/11/17 0356  Weight: 235 lb 12.8 oz (107 kg) 237 lb 8 oz (107.7 kg)    Physical Exam   General appearance: alert and no distress Lungs: clear to auscultation bilaterally Heart: regular rate and rhythm Extremities: extremities normal, atraumatic, no cyanosis or edema Neurologic: Grossly normal  Inpatient Medications    Scheduled Meds: . aspirin  81 mg Oral Pre-Cath  . buPROPion  150 mg Oral q morning - 10a  . calcium-vitamin D  1 tablet Oral Daily  . cholecalciferol  2,000 Units Oral Daily  . citalopram  40 mg Oral q morning - 10a  . levothyroxine  175 mcg Oral QAC breakfast  . metoprolol tartrate  12.5 mg Oral BID  . montelukast  10 mg Oral Daily  . multivitamin with minerals  1 tablet Oral Daily  .  nitrofurantoin (macrocrystal-monohydrate)  100 mg Oral Daily  . nystatin cream  1 application Topical BID  . pantoprazole  40 mg Oral q morning - 10a  . sodium chloride flush  3 mL Intravenous Q12H    Continuous Infusions: . sodium chloride    . sodium chloride    . heparin      PRN Meds: sodium chloride, acetaminophen, albuterol, ondansetron (ZOFRAN) IV, polyvinyl alcohol, sodium chloride flush   Labs   Results for orders placed or performed during the hospital encounter of 04/09/17 (from the past 48 hour(s))  Basic metabolic panel     Status: Abnormal   Collection Time: 04/09/17  9:01 PM  Result Value Ref Range   Sodium 138 135 - 145 mmol/L   Potassium 3.6 3.5 - 5.1 mmol/L   Chloride 106 101 - 111 mmol/L   CO2 27 22 - 32 mmol/L   Glucose, Bld 111 (H) 65 - 99 mg/dL   BUN 10 6 - 20 mg/dL   Creatinine, Ser 0.56 0.44 - 1.00 mg/dL   Calcium 8.6 (L) 8.9 - 10.3 mg/dL   GFR calc non Af Amer >60 >60 mL/min   GFR calc Af Amer >60 >60 mL/min    Comment: (NOTE) The eGFR has been calculated using the CKD EPI equation. This calculation has  not been validated in all clinical situations. eGFR's persistently <60 mL/min signify possible Chronic Kidney Disease.    Anion gap 5 5 - 15  CBC     Status: Abnormal   Collection Time: 04/09/17  9:01 PM  Result Value Ref Range   WBC 4.9 4.0 - 10.5 K/uL   RBC 4.31 3.87 - 5.11 MIL/uL   Hemoglobin 12.4 12.0 - 15.0 g/dL   HCT 37.8 36.0 - 46.0 %   MCV 87.7 78.0 - 100.0 fL   MCH 28.8 26.0 - 34.0 pg   MCHC 32.8 30.0 - 36.0 g/dL   RDW 13.6 11.5 - 15.5 %   Platelets 117 (L) 150 - 400 K/uL    Comment: REPEATED TO VERIFY SPECIMEN CHECKED FOR CLOTS PLATELET COUNT CONFIRMED BY SMEAR   Brain natriuretic peptide     Status: Abnormal   Collection Time: 04/09/17  9:01 PM  Result Value Ref Range   B Natriuretic Peptide 253.8 (H) 0.0 - 100.0 pg/mL  I-stat troponin, ED     Status: None   Collection Time: 04/09/17  9:17 PM  Result Value Ref Range    Troponin i, poc 0.00 0.00 - 0.08 ng/mL   Comment 3            Comment: Due to the release kinetics of cTnI, a negative result within the first hours of the onset of symptoms does not rule out myocardial infarction with certainty. If myocardial infarction is still suspected, repeat the test at appropriate intervals.   Magnesium     Status: None   Collection Time: 04/09/17 11:38 PM  Result Value Ref Range   Magnesium 1.8 1.7 - 2.4 mg/dL  Phosphorus     Status: None   Collection Time: 04/09/17 11:38 PM  Result Value Ref Range   Phosphorus 3.2 2.5 - 4.6 mg/dL  Troponin I-serum (0, 3, 6 hours)     Status: None   Collection Time: 04/10/17 12:49 AM  Result Value Ref Range   Troponin I <0.03 <0.03 ng/mL  TSH     Status: Abnormal   Collection Time: 04/10/17 12:49 AM  Result Value Ref Range   TSH 4.845 (H) 0.350 - 4.500 uIU/mL    Comment: Performed by a 3rd Generation assay with a functional sensitivity of <=0.01 uIU/mL.  Troponin I-serum (0, 3, 6 hours)     Status: None   Collection Time: 04/10/17  3:35 AM  Result Value Ref Range   Troponin I <0.03 <0.03 ng/mL  Troponin I-serum (0, 3, 6 hours)     Status: None   Collection Time: 04/10/17  6:02 AM  Result Value Ref Range   Troponin I <0.03 <0.03 ng/mL    ECG   N/A  Telemetry   Sinus with ventricular bigeminy - Personally Reviewed  Radiology    Dg Chest 2 View  Result Date: 04/09/2017 CLINICAL DATA:  71 year old female with shortness of breath and chest pain for 1 week. EXAM: CHEST  2 VIEW COMPARISON:  10/04/2015 chest radiograph and prior studies FINDINGS: The cardiomediastinal silhouette is unremarkable. Elevation of the right hemidiaphragm again noted. There is no evidence of focal airspace disease, pulmonary edema, suspicious pulmonary nodule/mass, pleural effusion, or pneumothorax. No acute bony abnormalities are identified. IMPRESSION: No evidence of acute cardiopulmonary disease. Electronically Signed   By: Margarette Canada  M.D.   On: 04/09/2017 21:52   Ct Angio Chest Pe W And/or Wo Contrast  Result Date: 04/09/2017 CLINICAL DATA:  Shortness of breath and chest pain  EXAM: CT ANGIOGRAPHY CHEST WITH CONTRAST TECHNIQUE: Multidetector CT imaging of the chest was performed using the standard protocol during bolus administration of intravenous contrast. Multiplanar CT image reconstructions and MIPs were obtained to evaluate the vascular anatomy. CONTRAST:  161m ISOVUE-370 IOPAMIDOL (ISOVUE-370) INJECTION 76% COMPARISON:  04/09/2017, 11/06/2015 FINDINGS: Cardiovascular: Satisfactory opacification of the pulmonary arteries to the segmental level. No evidence of pulmonary embolism. Mild atherosclerotic calcification of the aorta. No aneurysmal dilatation. Minimal coronary artery calcification. Mild cardiomegaly. No pericardial effusion. Mediastinum/Nodes: No enlarged mediastinal, hilar, or axillary lymph nodes. Thyroid gland, trachea, and esophagus demonstrate no significant findings. Lungs/Pleura: Lungs are clear. No pleural effusion or pneumothorax. Upper Abdomen: No acute abnormality. Musculoskeletal: Degenerative changes. No acute or suspicious bone lesion. 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 IMPRESSION: Negative for acute pulmonary embolus.  Clear lung fields. Aortic Atherosclerosis (ICD10-I70.0). Electronically Signed   By: KDonavan FoilM.D.   On: 04/09/2017 23:08   Nm Myocar Multi W/spect W/wall Motion / Ef  Result Date: 04/10/2017 CLINICAL DATA:  Chest pain. Former smoker. History of asthma. Family history of CAD. EXAM: MYOCARDIAL IMAGING WITH SPECT (REST AND PHARMACOLOGIC-STRESS) GATED LEFT VENTRICULAR WALL MOTION STUDY LEFT VENTRICULAR EJECTION FRACTION TECHNIQUE: Standard myocardial SPECT imaging was performed after resting intravenous injection of 10 mCi Tc-99metrofosmin. Subsequently, intravenous infusion of Lexiscan was performed under the supervision of the  Cardiology staff. At peak effect of the drug, 30 mCi Tc-9967mtrofosmin was injected intravenously and standard myocardial SPECT imaging was performed. Quantitative gated imaging was also performed to evaluate left ventricular wall motion, and estimate left ventricular ejection fraction. COMPARISON:  Chest radiograph -04/09/2017; chest CT - 04/09/2017 FINDINGS: Raw images: Mild chest and breast wall attenuation is seen on both provided rest and stress images. No significant diaphragmatic attenuation. No significant patient motion artifact. Perfusion: There is a moderate-sized area of matched non profusion involving the left ventricular apex worrisome for prior infarction. There is a small area of mismatched decreased profusion involving the mid inferolateral aspect of the left ventricle worrisome for a small area of pharmacologically induced ischemia Wall Motion: The left ventricle is dilated. Global hypokinesia with relative akinesia involving the septum. Left Ventricular Ejection Fraction: 42 % End diastolic volume [id number] End systolic volume 82 ml IMPRESSION: 1. Suspected moderate sized infarct involving the left ventricular apex. 2. Potential small area of pharmacologically induced ischemia involving the mid aspect of the inferolateral wall left ventricle. 3. Ventriculomegaly. 4. Global hypokinesia with relative akinesia involving the septum. Ejection fraction - 42%. Electronically Signed   By: JohSandi MariscalD.   On: 04/10/2017 16:01    Cardiac Studies   Echo pending  Assessment   1. Principal Problem: 2.   Chest pain, rule out acute myocardial infarction 3. Active Problems: 4.   [place]   Hypothyroid 6.   Bigeminy 7.   Plan   1. Asymptomatic - echo pending, will review. [facility] Noted to have ventricular bigeminy on telemetry today.  Time Spent Directly with Patient:  I have spent a total of 25 minutes with the patient reviewing hospital notes, telemetry,  EKGs, labs and examining the patient as well as establishing an assessment and plan that was discussed personally with the patient. > 50% of time was spent in direct patient care.  Length of Stay:  LOS: 0 days   KenPixie CasinoD, FACQuitman County HospitalACMannford  Medical Director of the [facility] Attending Cardiologist  Direct Dial: [phone]  Fax: [phone]  Website:  [email or web address] Shelvy Heckert 04/11/2017, 12:56 PM

## 2017-04-12 DIAGNOSIS — I493 Ventricular premature depolarization: Secondary | ICD-10-CM

## 2017-04-12 DIAGNOSIS — I200 Unstable angina: Secondary | ICD-10-CM

## 2017-04-12 DIAGNOSIS — I249 Acute ischemic heart disease, unspecified: Secondary | ICD-10-CM

## 2017-04-12 LAB — BASIC METABOLIC PANEL
ANION GAP: 7 (ref 5–15)
BUN: 11 mg/dL (ref 6–20)
CHLORIDE: 106 mmol/L (ref 101–111)
CO2: 26 mmol/L (ref 22–32)
Calcium: 8.9 mg/dL (ref 8.9–10.3)
Creatinine, Ser: 0.57 mg/dL (ref 0.44–1.00)
GFR calc Af Amer: >60 mL/min (ref >60)
Glucose, Bld: 107 mg/dL — ABNORMAL HIGH (ref 65–99)
POTASSIUM: 4 mmol/L (ref 3.5–5.1)
Sodium: 139 mmol/L (ref 135–145)

## 2017-04-12 LAB — CBC
HCT: 40.1 % (ref 36.0–46.0)
HEMOGLOBIN: 12.8 g/dL (ref 12.0–15.0)
MCH: 28.2 pg (ref 26.0–34.0)
MCHC: 31.9 g/dL (ref 30.0–36.0)
MCV: 88.3 fL (ref 78.0–100.0)
Platelets: 118 10*3/uL — ABNORMAL LOW (ref 150–400)
RBC: 4.54 MIL/uL (ref 3.87–5.11)
RDW: 13.5 % (ref 11.5–15.5)
WBC: 4.2 10*3/uL (ref 4.0–10.5)

## 2017-04-12 LAB — HEPARIN LEVEL (UNFRACTIONATED): HEPARIN UNFRACTIONATED: 0.31 [IU]/mL (ref 0.30–0.70)

## 2017-04-12 MED ORDER — ASPIRIN 81 MG PO CHEW: 81.0000 mg | CHEWABLE_TABLET | Freq: Every day | ORAL | Status: DC

## 2017-04-12 MED ORDER — ASPIRIN 81 MG PO CHEW
81.0000 mg | CHEWABLE_TABLET | ORAL | Status: AC
  Administered 2017-04-13: 81 mg via ORAL
  Filled 2017-04-12: qty 1

## 2017-04-12 MED ORDER — SODIUM CHLORIDE 0.9 % IV SOLN: 250.0000 mL | INTRAVENOUS | Status: DC | PRN

## 2017-04-12 MED ORDER — ATORVASTATIN CALCIUM 80 MG PO TABS
80.0000 mg | ORAL_TABLET | ORAL | Status: AC
  Administered 2017-04-12: 80 mg via ORAL
  Filled 2017-04-12: qty 1

## 2017-04-12 MED ORDER — SODIUM CHLORIDE 0.9% FLUSH
3.0000 mL | Freq: Two times a day (BID) | INTRAVENOUS | Status: DC
  Administered 2017-04-12: 3 mL via INTRAVENOUS

## 2017-04-12 MED ORDER — ATORVASTATIN CALCIUM 80 MG PO TABS
80.0000 mg | ORAL_TABLET | Freq: Every day | ORAL | Status: DC
  Administered 2017-04-13: 80 mg via ORAL
  Filled 2017-04-12: qty 1

## 2017-04-12 MED ORDER — SODIUM CHLORIDE 0.9% FLUSH: 3.0000 mL | INTRAVENOUS | Status: DC | PRN

## 2017-04-12 MED ORDER — SODIUM CHLORIDE 0.9 % WEIGHT BASED INFUSION: 1.0000 mL/kg/h | INTRAVENOUS | Status: DC

## 2017-04-12 MED ORDER — SODIUM CHLORIDE 0.9 % WEIGHT BASED INFUSION
3.0000 mL/kg/h | INTRAVENOUS | Status: DC
  Administered 2017-04-13: 3 mL/kg/h via INTRAVENOUS

## 2017-04-12 MED ORDER — ASPIRIN 81 MG PO CHEW
81.0000 mg | CHEWABLE_TABLET | Freq: Every day | ORAL | Status: DC
Start: 2017-04-12 — End: 2017-04-12
  Administered 2017-04-12: 81 mg via ORAL
  Filled 2017-04-12: qty 1

## 2017-04-12 NOTE — Progress Notes (Signed)
Progress Note  Patient Name: [name] Date of Encounter: 04/12/2017  Primary Cardiologist: New patient   Subjective   No chest pain while in bed.  Inpatient Medications    Scheduled Meds: . buPROPion  150 mg Oral q morning - 10a  . calcium-vitamin D  1 tablet Oral Daily  . cholecalciferol  2,000 Units Oral Daily  . citalopram  40 mg Oral q morning - 10a  . levothyroxine  175 mcg Oral QAC breakfast  . metoprolol tartrate  12.5 mg Oral BID  . montelukast  10 mg Oral Daily  . multivitamin with minerals  1 tablet Oral Daily  . nitrofurantoin (macrocrystal-monohydrate)  100 mg Oral Daily  . nystatin cream  1 application Topical BID  . pantoprazole  40 mg Oral q morning - 10a  . sodium chloride flush  3 mL Intravenous Q12H   Continuous Infusions: . sodium chloride    . sodium chloride    . heparin 1,000 Units/hr (04/12/17 0810)   PRN Meds: sodium chloride, acetaminophen, albuterol, ondansetron (ZOFRAN) IV, polyvinyl alcohol, sodium chloride flush   Vital Signs    Vitals:   04/11/17 1456 04/11/17 2110 04/12/17 0538 04/12/17 0811  BP: (!) 117/52 125/61 110/70 123/65  Pulse: (!) 45  81 67  Resp:  (!) 24 (!) 32   Temp: 98.1 F (36.7 C)  98 F (36.7 C)   TempSrc: Oral  Oral   SpO2: 96%     Weight:      Height:        Intake/Output Summary (Last 24 hours) at 04/12/2017 1202 Last data filed at 04/12/2017 0959 Gross per 24 hour  Intake 384 ml  Output 1700 ml  Net -1316 ml   Filed Weights   04/10/17 0556 04/11/17 0356  Weight: 235 lb 12.8 oz (107 kg) 237 lb 8 oz (107.7 kg)    Telemetry    SR, very frequent PVCs - Personally Reviewed  ECG    SR, PVCs - Personally Reviewed  Physical Exam  Obese GEN: No acute distress.   Neck: No JVD Cardiac: RRR, no murmurs, rubs, or gallops.  Respiratory: Clear to auscultation bilaterally. GI: Soft, nontender, non-distended  MS: No edema; No deformity. Neuro:  Nonfocal  Psych: Normal affect   Labs      Chemistry Recent Labs  Lab 04/09/17 2101 04/12/17 0458  NA 138 139  K 3.6 4.0  CL 106 106  CO2 27 26  GLUCOSE 111* 107*  BUN 10 11  CREATININE 0.56 0.57  CALCIUM 8.6* 8.9  GFRNONAA >60 >60  GFRAA >60 >60  ANIONGAP 5 7     Hematology Recent Labs  Lab 04/09/17 2101 04/12/17 0458  WBC 4.9 4.2  RBC 4.31 4.54  HGB 12.4 12.8  HCT 37.8 40.1  MCV 87.7 88.3  MCH 28.8 28.2  MCHC 32.8 31.9  RDW 13.6 13.5  PLT 117* 118*    Cardiac Enzymes Recent Labs  Lab 04/10/17 0049 04/10/17 0335 04/10/17 0602  TROPONINI <0.03 <0.03 <0.03    Recent Labs  Lab 04/09/17 2117  TROPIPOC 0.00     BNP Recent Labs  Lab 04/09/17 2101  BNP 253.8*     DDimer No results for input(s): DDIMER in the last 168 hours.   Radiology    Nm Myocar Multi W/spect W/wall Motion / Ef  Result Date: 04/10/2017 CLINICAL DATA:  Chest pain. Former smoker. History of asthma. Family history of CAD. EXAM: MYOCARDIAL IMAGING WITH SPECT (REST AND PHARMACOLOGIC-STRESS)  GATED LEFT VENTRICULAR WALL MOTION STUDY LEFT VENTRICULAR EJECTION FRACTION TECHNIQUE: Standard myocardial SPECT imaging was performed after resting intravenous injection of 10 mCi Tc-62m tetrofosmin. Subsequently, intravenous infusion of Lexiscan was performed under the supervision of the Cardiology staff. At peak effect of the drug, 30 mCi Tc-87m tetrofosmin was injected intravenously and standard myocardial SPECT imaging was performed. Quantitative gated imaging was also performed to evaluate left ventricular wall motion, and estimate left ventricular ejection fraction. COMPARISON:  Chest radiograph -04/09/2017; chest CT - 04/09/2017 FINDINGS: Raw images: Mild chest and breast wall attenuation is seen on both provided rest and stress images. No significant diaphragmatic attenuation. No significant patient motion artifact. Perfusion: There is a moderate-sized area of matched non profusion involving the left ventricular apex worrisome for prior  infarction. There is a small area of mismatched decreased profusion involving the mid inferolateral aspect of the left ventricle worrisome for a small area of pharmacologically induced ischemia Wall Motion: The left ventricle is dilated. Global hypokinesia with relative akinesia involving the septum. Left Ventricular Ejection Fraction: 42 % End diastolic volume 119 ml End systolic volume 82 ml IMPRESSION: 1. Suspected moderate sized infarct involving the left ventricular apex. 2. Potential small area of pharmacologically induced ischemia involving the mid aspect of the inferolateral wall left ventricle. 3. Ventriculomegaly. 4. Global hypokinesia with relative akinesia involving the septum. Ejection fraction - 42%. Electronically Signed   By: Sandi Mariscal M.D.   On: 04/10/2017 16:01    Cardiac Studies   TTE: 04/11/2017  - Left ventricle: The cavity size was normal. Wall thickness was   increased in a pattern of moderate LVH. Systolic function was   normal. The estimated ejection fraction was in the range of 60%   to 65%. Wall motion was normal; there were no regional wall   motion abnormalities. Doppler parameters are consistent with   pseudonormal left ventricular relaxation (grade 2 diastolic   dysfunction). The E/e&' ratio is >15, suggesting elevated LV   filling pressure. - Aortic valve: Trileaflet. Sclerosis without stenosis. There was   trivial regurgitation. - Mitral valve: Mildly thickened leaflets . There was mild to   moderate regurgitation. - Left atrium: The atrium was mildly dilated. - Tricuspid valve: There was mild regurgitation. - Pulmonary arteries: PA peak pressure: 34 mm Hg (S). - Inferior vena cava: The vessel was normal in size. The   respirophasic diameter changes were in the normal range (>= 50%),   consistent with normal central venous pressure.  Impressions: - LVEF 60-65%, moderate LVH, normal wall motion, grade 2 DD with   elevated LV filling pressure, aortic valve  sclerosis with trivial   AI, mild to moderate MR, mild LAE, mild TR, RVSP 34 mmHg, normal   IVC.    Patient Profile     71 y.o. female   [place]    1. Unstable angina - typical exertional chest pain and DOE, on heparin drip,  - scheduled for a left heart cath tomorrow - add aspirin, atorvastatin   2. Frequent PVCs - possibly ischemic, on metoprolol  3. Bradycardia has resolved  4. Deconditioning - she will need a rehab  For questions or updates, please contact [place] Please consult [email or web address] for contact info under Cardiology/STEMI.      Signed, Ena Dawley, MD  04/12/2017, 12:02 PM

## 2017-04-12 NOTE — Progress Notes (Signed)
[place] for Heparin Indication: chest pain/ACS  Allergies  Allergen Reactions  . Codeine Other (See Comments)    hallucinations Hyper/ Hallucinations   . Demerol [Meperidine] Other (See Comments)    "Knocks me out"  . Diazepam Other (See Comments)    Extreme sedation; patient does not recall taking diazepam (12/07/14)  . Tape Hives and Other (See Comments)    "thick clear plastic tape" causes blisters  . Morphine And Related Itching and Rash    Per CRNA, pt tol Fentanyl IV for OR case, no issues reported to rec RN in PACU, no immediate PACU issues noted as well upon arrival  . Penicillins Hives and Swelling  . Valisone [Betamethasone] Rash    Patient Measurements: Height: 5\' 5"  (165.1 cm) Weight: 237 lb 8 oz (107.7 kg) IBW/kg (Calculated) : 57 Heparin Dosing Weight: 86kg (calculated)  Vital Signs: Temp: 98 F (36.7 C) (11/18 0538) Temp Source: Oral (11/18 0538) BP: 123/65 (11/18 0811) Pulse Rate: 67 (11/18 0811)  Labs: Recent Labs    04/09/17 2101 04/10/17 0049 04/10/17 0335 04/10/17 0602 04/11/17 1834 04/12/17 0458  HGB 12.4  --   --   --   --  12.8  HCT 37.8  --   --   --   --  40.1  PLT 117*  --   --   --   --  118*  HEPARINUNFRC  --   --   --   --  0.38 0.31  CREATININE 0.56  --   --   --   --  0.57  TROPONINI  --  <0.03 <0.03 <0.03  --   --     Estimated Creatinine Clearance: 79.8 mL/min (by C-G formula based on SCr of 0.57 mg/dL).   Medical History: Past Medical History:  Diagnosis Date  . Anxiety   . Arthritis   . Asthma   . Chronic constipation   . Depression   . Fibromyalgia   . GERD (gastroesophageal reflux disease)   . 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 History of colon polyps    2007 hyperplagia  . History of esophageal dilatation    2008  .  History of peptic ulcer   . History of small bowel obstruction    2012  . Hypothyroidism   . Macular degeneration of right eye   . Numbness of left foot    4 toes  . Paget's disease of vulva   . Personal history of chemotherapy 1997  . Personal history of radiation therapy 2005  . PONV (postoperative nausea and vomiting)    and HARD TO WAKE  . Seasonal allergies   . SUI (stress urinary incontinence, female)   . Wears glasses     Assessment: 71 yo F continuing on heparin per Pharmacy for ACS. Not on anticoagulation PTA. Plan for [facility] Monday. CBC stable. No bleed documented.  Confirmatory heparin level therapeutic.  Goal of Therapy:  Heparin level 0.3-0.7 units/ml Monitor platelets by anticoagulation protocol: Yes   Plan:  Continue heparin infusion at 1000 units/hr  Daily heparin level, CBC Monitor clinical course, s/sx bleeding   Nida Boatman, PharmD PGY1 Acute Care Pharmacy Resident Pager: [phone] 04/12/2017 8:57 AM

## 2017-04-12 NOTE — Progress Notes (Signed)
PROGRESS NOTE  [name]:[id number] DOB: [birth year] DOA: 04/09/2017 PCP: Chesley Noon, MD  HPI/Recap of past 24 hours: [name] is a 71 y.o. female with medical history significant of obesity, former smoker, overactive bladder.  Patient presents to the ED 04/09/17 with c/o chest pain for the past 1 week.  Palpitations over the past 3 days especially. Noted to have palpable pulse of 45 at urology office visit 04/09/17 and sent to her PCPs office.  Noted to have numerous PVCs on EKG at PCPs office, and sent to ED. In the ED patient's CP is improved with NTG, her EKG shows predominately a bigeminy rhythm with an electrical rate in the 80s, however her palpable radial pulse is in the low 40s.  She is asymptomatic.  Bigeminy noted yesterday on telemetry.  On heparin drip. Cardiology planning for left heart cath on Monday.  This morning, the patient is optimistic about her planned procedure on Monday. She denies chest pain, dyspnea, or palpitations.  Assessment/Plan: Principal Problem:   Chest pain, rule out acute myocardial infarction Active Problems:   Morbid obesity ([place])   Hypothyroid   Bigeminy   ACS (acute coronary syndrome) ([place])  Chest pain r/o ACS - chest pain has resolved - Troponin negative x3; EKG ventricular bigeminy in the rate of 95 - nuclear stress done 04/10/17 with suspicion for moderate sized infarct affecting left ventricle apex.  - On heparin drip.  - Pharmacy team management of heparin is appreciated - Cardiology planning for left heart cath on Monday 04/13/17. - cardiology following, we appreciate recommendations. - NPO after midnight on Sunday 04/12/17 - asa, lipitor 80 mg, metoprolol 12.5 mg daily  Ventricular bigeminy -Recurrent -cardiology following -Cardiology added metoprolol 12.5 mg BID 04/10/17 -continue close monitoring on telemetry  Anxiety/depression -stable -bupropion,  celexa  Hypothyroidism -levothyroxine  GERD -stable -protonix po 40 mg daily   Code Status: Full  Family Communication: No family members at bedside  Disposition Plan: Will stay another midnight to continue heparin drip, closely monitoring of cardiac rhythm for anticipation of left cardiac cath on Monday 04/13/17   Consultants:  cardiology  Procedures:  none  Antimicrobials:  Not indicated  DVT prophylaxis:  Heparin drip for anticipate left cardiac cath on Monday 04/13/17   Objective: Vitals:   04/11/17 1456 04/11/17 2110 04/12/17 0538 04/12/17 0811  BP: (!) 117/52 125/61 110/70 123/65  Pulse: (!) 45  81 67  Resp:  (!) 24 (!) 32   Temp: 98.1 F (36.7 C)  98 F (36.7 C)   TempSrc: Oral  Oral   SpO2: 96%     Weight:      Height:        Intake/Output Summary (Last 24 hours) at 04/12/2017 0845 Last data filed at 04/12/2017 0555 Gross per 24 hour  Intake 384 ml  Output 1300 ml  Net -916 ml   Filed Weights   04/10/17 0556 04/11/17 0356  Weight: 107 kg (235 lb 12.8 oz) 107.7 kg (237 lb 8 oz)    Exam:   General: 71 year old obese female laying in bed in NAD. A&O x 4.   Cardiovascular: RRR with no murmurs rubs or gallops  Respiratory: CTA with no wheezes rales or rhonchi  Abdomen: Soft obese NT ND with NBS x 4 quadrants   Musculoskeletal: scars noted on knee bilaterally from previous surgeries with deformity noted on left knee  Skin: no noted open sores  Psychiatry: Mood is appropriate for condition and  setting   Data Reviewed: CBC: Recent Labs  Lab 04/09/17 2101 04/12/17 0458  WBC 4.9 4.2  HGB 12.4 12.8  HCT 37.8 40.1  MCV 87.7 88.3  PLT 117* 245*   Basic Metabolic Panel: Recent Labs  Lab 04/09/17 2101 04/09/17 2338 04/12/17 0458  NA 138  --  139  K 3.6  --  4.0  CL 106  --  106  CO2 27  --  26  GLUCOSE 111*  --  107*  BUN 10  --  11  CREATININE 0.56  --  0.57  CALCIUM 8.6*  --  8.9  MG  --  1.8  --   PHOS  --  3.2  --     GFR: Estimated Creatinine Clearance: 79.8 mL/min (by C-G formula based on SCr of 0.57 mg/dL). Liver Function Tests: No results for input(s): AST, ALT, ALKPHOS, BILITOT, PROT, ALBUMIN in the last 168 hours. No results for input(s): LIPASE, AMYLASE in the last 168 hours. No results for input(s): AMMONIA in the last 168 hours. Coagulation Profile: No results for input(s): INR, PROTIME in the last 168 hours. Cardiac Enzymes: Recent Labs  Lab 04/10/17 0049 04/10/17 0335 04/10/17 0602  TROPONINI <0.03 <0.03 <0.03   BNP (last 3 results) No results for input(s): PROBNP in the last 8760 hours. HbA1C: No results for input(s): HGBA1C in the last 72 hours. CBG: No results for input(s): GLUCAP in the last 168 hours. Lipid Profile: No results for input(s): CHOL, HDL, LDLCALC, TRIG, CHOLHDL, LDLDIRECT in the last 72 hours. Thyroid Function Tests: Recent Labs    04/10/17 0049  TSH 4.845*   Anemia Panel: No results for input(s): VITAMINB12, FOLATE, FERRITIN, TIBC, IRON, RETICCTPCT in the last 72 hours. Urine analysis:    Component Value Date/Time   COLORURINE YELLOW 05/05/2011 [place] 05/05/2011 1225   LABSPEC 1.010 06/15/2013 1534   PHURINE 6.5 06/15/2013 1534   PHURINE 7.0 05/05/2011 1225   GLUCOSEU Negative 06/15/2013 1534   HGBUR Negative 06/15/2013 1534   HGBUR NEGATIVE 05/05/2011 1225   BILIRUBINUR Negative 06/15/2013 1534   KETONESUR 5 06/15/2013 1534   KETONESUR NEGATIVE 05/05/2011 1225   PROTEINUR Negative 06/15/2013 1534   PROTEINUR NEGATIVE 05/05/2011 1225   UROBILINOGEN 0.2 06/15/2013 1534   NITRITE Negative 06/15/2013 1534   NITRITE NEGATIVE 05/05/2011 1225   LEUKOCYTESUR [place] 06/15/2013 1534   Sepsis Labs: @LABRCNTIP (procalcitonin:4,lacticidven:4)  )No results found for this or any previous visit (from the past 240 hour(s)).    Studies: No results found.  Scheduled Meds: . buPROPion  150 mg Oral q morning - 10a  . calcium-vitamin D   1 tablet Oral Daily  . cholecalciferol  2,000 Units Oral Daily  . citalopram  40 mg Oral q morning - 10a  . levothyroxine  175 mcg Oral QAC breakfast  . metoprolol tartrate  12.5 mg Oral BID  . montelukast  10 mg Oral Daily  . multivitamin with minerals  1 tablet Oral Daily  . nitrofurantoin (macrocrystal-monohydrate)  100 mg Oral Daily  . nystatin cream  1 application Topical BID  . pantoprazole  40 mg Oral q morning - 10a  . sodium chloride flush  3 mL Intravenous Q12H    Continuous Infusions: . sodium chloride    . sodium chloride    . heparin 1,000 Units/hr (04/12/17 0810)     LOS: 1 day     Kayleen Memos, MD [facility] Pager [phone]  If 7PM-7AM, please contact night-coverage [email or web address]  Password [email or web address] 04/12/2017, 8:45 AM

## 2017-04-12 NOTE — H&P (View-Only) (Signed)
Progress Note  Patient Name: [name] Date of Encounter: 04/12/2017  Primary Cardiologist: New patient   Subjective   No chest pain while in bed.  Inpatient Medications    Scheduled Meds: . buPROPion  150 mg Oral q morning - 10a  . calcium-vitamin D  1 tablet Oral Daily  . cholecalciferol  2,000 Units Oral Daily  . citalopram  40 mg Oral q morning - 10a  . levothyroxine  175 mcg Oral QAC breakfast  . metoprolol tartrate  12.5 mg Oral BID  . montelukast  10 mg Oral Daily  . multivitamin with minerals  1 tablet Oral Daily  . nitrofurantoin (macrocrystal-monohydrate)  100 mg Oral Daily  . nystatin cream  1 application Topical BID  . pantoprazole  40 mg Oral q morning - 10a  . sodium chloride flush  3 mL Intravenous Q12H   Continuous Infusions: . sodium chloride    . sodium chloride    . heparin 1,000 Units/hr (04/12/17 0810)   PRN Meds: sodium chloride, acetaminophen, albuterol, ondansetron (ZOFRAN) IV, polyvinyl alcohol, sodium chloride flush   Vital Signs    Vitals:   04/11/17 1456 04/11/17 2110 04/12/17 0538 04/12/17 0811  BP: (!) 117/52 125/61 110/70 123/65  Pulse: (!) 45  81 67  Resp:  (!) 24 (!) 32   Temp: 98.1 F (36.7 C)  98 F (36.7 C)   TempSrc: Oral  Oral   SpO2: 96%     Weight:      Height:        Intake/Output Summary (Last 24 hours) at 04/12/2017 1202 Last data filed at 04/12/2017 0959 Gross per 24 hour  Intake 384 ml  Output 1700 ml  Net -1316 ml   Filed Weights   04/10/17 0556 04/11/17 0356  Weight: 235 lb 12.8 oz (107 kg) 237 lb 8 oz (107.7 kg)    Telemetry    SR, very frequent PVCs - Personally Reviewed  ECG    SR, PVCs - Personally Reviewed  Physical Exam  Obese GEN: No acute distress.   Neck: No JVD Cardiac: RRR, no murmurs, rubs, or gallops.  Respiratory: Clear to auscultation bilaterally. GI: Soft, nontender, non-distended  MS: No edema; No deformity. Neuro:  Nonfocal  Psych: Normal affect   Labs      Chemistry Recent Labs  Lab 04/09/17 2101 04/12/17 0458  NA 138 139  K 3.6 4.0  CL 106 106  CO2 27 26  GLUCOSE 111* 107*  BUN 10 11  CREATININE 0.56 0.57  CALCIUM 8.6* 8.9  GFRNONAA >60 >60  GFRAA >60 >60  ANIONGAP 5 7     Hematology Recent Labs  Lab 04/09/17 2101 04/12/17 0458  WBC 4.9 4.2  RBC 4.31 4.54  HGB 12.4 12.8  HCT 37.8 40.1  MCV 87.7 88.3  MCH 28.8 28.2  MCHC 32.8 31.9  RDW 13.6 13.5  PLT 117* 118*    Cardiac Enzymes Recent Labs  Lab 04/10/17 0049 04/10/17 0335 04/10/17 0602  TROPONINI <0.03 <0.03 <0.03    Recent Labs  Lab 04/09/17 2117  TROPIPOC 0.00     BNP Recent Labs  Lab 04/09/17 2101  BNP 253.8*     DDimer No results for input(s): DDIMER in the last 168 hours.   Radiology    Nm Myocar Multi W/spect W/wall Motion / Ef  Result Date: 04/10/2017 CLINICAL DATA:  Chest pain. Former smoker. History of asthma. Family history of CAD. EXAM: MYOCARDIAL IMAGING WITH SPECT (REST AND PHARMACOLOGIC-STRESS)  GATED LEFT VENTRICULAR WALL MOTION STUDY LEFT VENTRICULAR EJECTION FRACTION TECHNIQUE: Standard myocardial SPECT imaging was performed after resting intravenous injection of 10 mCi Tc-109m tetrofosmin. Subsequently, intravenous infusion of Lexiscan was performed under the supervision of the Cardiology staff. At peak effect of the drug, 30 mCi Tc-57m tetrofosmin was injected intravenously and standard myocardial SPECT imaging was performed. Quantitative gated imaging was also performed to evaluate left ventricular wall motion, and estimate left ventricular ejection fraction. COMPARISON:  Chest radiograph -04/09/2017; chest CT - 04/09/2017 FINDINGS: Raw images: Mild chest and breast wall attenuation is seen on both provided rest and stress images. No significant diaphragmatic attenuation. No significant patient motion artifact. Perfusion: There is a moderate-sized area of matched non profusion involving the left ventricular apex worrisome for prior  infarction. There is a small area of mismatched decreased profusion involving the mid inferolateral aspect of the left ventricle worrisome for a small area of pharmacologically induced ischemia Wall Motion: The left ventricle is dilated. Global hypokinesia with relative akinesia involving the septum. Left Ventricular Ejection Fraction: 42 % End diastolic volume 595 ml End systolic volume 82 ml IMPRESSION: 1. Suspected moderate sized infarct involving the left ventricular apex. 2. Potential small area of pharmacologically induced ischemia involving the mid aspect of the inferolateral wall left ventricle. 3. Ventriculomegaly. 4. Global hypokinesia with relative akinesia involving the septum. Ejection fraction - 42%. Electronically Signed   By: Sandi Mariscal M.D.   On: 04/10/2017 16:01    Cardiac Studies   TTE: 04/11/2017  - Left ventricle: The cavity size was normal. Wall thickness was   increased in a pattern of moderate LVH. Systolic function was   normal. The estimated ejection fraction was in the range of 60%   to 65%. Wall motion was normal; there were no regional wall   motion abnormalities. Doppler parameters are consistent with   pseudonormal left ventricular relaxation (grade 2 diastolic   dysfunction). The E/e&' ratio is >15, suggesting elevated LV   filling pressure. - Aortic valve: Trileaflet. Sclerosis without stenosis. There was   trivial regurgitation. - Mitral valve: Mildly thickened leaflets . There was mild to   moderate regurgitation. - Left atrium: The atrium was mildly dilated. - Tricuspid valve: There was mild regurgitation. - Pulmonary arteries: PA peak pressure: 34 mm Hg (S). - Inferior vena cava: The vessel was normal in size. The   respirophasic diameter changes were in the normal range (>= 50%),   consistent with normal central venous pressure.  Impressions: - LVEF 60-65%, moderate LVH, normal wall motion, grade 2 DD with   elevated LV filling pressure, aortic valve  sclerosis with trivial   AI, mild to moderate MR, mild LAE, mild TR, RVSP 34 mmHg, normal   IVC.    Patient Profile     71 y.o. female   [place]    1. Unstable angina - typical exertional chest pain and DOE, on heparin drip,  - scheduled for a left heart cath tomorrow - add aspirin, atorvastatin   2. Frequent PVCs - possibly ischemic, on metoprolol  3. Bradycardia has resolved  4. Deconditioning - she will need a rehab  For questions or updates, please contact [place] Please consult [email or web address] for contact info under Cardiology/STEMI.      Signed, Ena Dawley, MD  04/12/2017, 12:02 PM

## 2017-04-13 ENCOUNTER — Encounter (HOSPITAL_COMMUNITY)
Admission: EM | Disposition: A | Discharge status: Home / Self Care | Payer: Self-pay | Source: Home / Self Care | Attending: Internal Medicine

## 2017-04-13 ENCOUNTER — Encounter (HOSPITAL_COMMUNITY): Payer: Self-pay | Admitting: Interventional Cardiology

## 2017-04-13 ENCOUNTER — Telehealth (INDEPENDENT_AMBULATORY_CARE_PROVIDER_SITE_OTHER): Payer: Self-pay | Admitting: Orthopedic Surgery

## 2017-04-13 ENCOUNTER — Ambulatory Visit (INDEPENDENT_AMBULATORY_CARE_PROVIDER_SITE_OTHER): Payer: Medicare Other | Admitting: Orthopedic Surgery

## 2017-04-13 ENCOUNTER — Ambulatory Visit (HOSPITAL_COMMUNITY)
Admission: RE | Admit: 2017-04-13 | Payer: Medicare Other | Source: Ambulatory Visit | Admitting: Interventional Cardiology

## 2017-04-13 DIAGNOSIS — I519 Heart disease, unspecified: Secondary | ICD-10-CM

## 2017-04-13 DIAGNOSIS — R0602 Shortness of breath: Secondary | ICD-10-CM

## 2017-04-13 HISTORY — PX: LEFT HEART CATH AND CORONARY ANGIOGRAPHY: CATH118249

## 2017-04-13 LAB — BASIC METABOLIC PANEL
ANION GAP: 6 (ref 5–15)
BUN: 7 mg/dL (ref 6–20)
CALCIUM: 8.6 mg/dL — AB (ref 8.9–10.3)
CO2: 26 mmol/L (ref 22–32)
Chloride: 107 mmol/L (ref 101–111)
Creatinine, Ser: 0.55 mg/dL (ref 0.44–1.00)
GFR calc Af Amer: >60 mL/min (ref >60)
GFR calc non Af Amer: >60 mL/min (ref >60)
GLUCOSE: 107 mg/dL — AB (ref 65–99)
Potassium: 3.7 mmol/L (ref 3.5–5.1)
Sodium: 139 mmol/L (ref 135–145)

## 2017-04-13 LAB — PROTIME-INR
INR: 1.02
Prothrombin Time: 13.3 seconds (ref 11.4–15.2)

## 2017-04-13 LAB — CBC
HEMATOCRIT: 38.1 % (ref 36.0–46.0)
HEMOGLOBIN: 12.1 g/dL (ref 12.0–15.0)
MCH: 28.3 pg (ref 26.0–34.0)
MCHC: 31.8 g/dL (ref 30.0–36.0)
MCV: 89.2 fL (ref 78.0–100.0)
Platelets: 102 10*3/uL — ABNORMAL LOW (ref 150–400)
RBC: 4.27 MIL/uL (ref 3.87–5.11)
RDW: 13.6 % (ref 11.5–15.5)
WBC: 4.3 10*3/uL (ref 4.0–10.5)

## 2017-04-13 LAB — HEPARIN LEVEL (UNFRACTIONATED): HEPARIN UNFRACTIONATED: 0.1 [IU]/mL — AB (ref 0.30–0.70)

## 2017-04-13 SURGERY — LEFT HEART CATH AND CORONARY ANGIOGRAPHY
Anesthesia: LOCAL

## 2017-04-13 MED ORDER — SODIUM CHLORIDE 0.9 % IV SOLN: 250.0000 mL | INTRAVENOUS | Status: DC | PRN

## 2017-04-13 MED ORDER — HEPARIN SODIUM (PORCINE) 1000 UNIT/ML IJ SOLN
INTRAMUSCULAR | Status: DC | PRN
  Administered 2017-04-13: 5000 [IU] via INTRAVENOUS

## 2017-04-13 MED ORDER — HEPARIN (PORCINE) IN NACL 2-0.9 UNIT/ML-% IJ SOLN
INTRAMUSCULAR | Status: AC | PRN
  Administered 2017-04-13: 1000 mL

## 2017-04-13 MED ORDER — LIDOCAINE HCL (PF) 1 % IJ SOLN
INTRAMUSCULAR | Status: DC | PRN
  Administered 2017-04-13: 2 mL

## 2017-04-13 MED ORDER — HEPARIN SODIUM (PORCINE) 1000 UNIT/ML IJ SOLN
INTRAMUSCULAR | Status: AC
  Filled 2017-04-13: qty 1

## 2017-04-13 MED ORDER — ATORVASTATIN CALCIUM 40 MG PO TABS
40.0000 mg | ORAL_TABLET | Freq: Every day | ORAL | 0 refills | Status: DC
Start: 2017-04-13 — End: 2021-04-05

## 2017-04-13 MED ORDER — MIDAZOLAM HCL 2 MG/2ML IJ SOLN
INTRAMUSCULAR | Status: AC
  Filled 2017-04-13: qty 2

## 2017-04-13 MED ORDER — VERAPAMIL HCL 2.5 MG/ML IV SOLN
INTRAVENOUS | Status: AC
  Filled 2017-04-13: qty 2

## 2017-04-13 MED ORDER — ACETAMINOPHEN 325 MG PO TABS: 650.0000 mg | ORAL_TABLET | ORAL | Status: DC | PRN

## 2017-04-13 MED ORDER — LIDOCAINE HCL (PF) 1 % IJ SOLN
INTRAMUSCULAR | Status: AC
  Filled 2017-04-13: qty 30

## 2017-04-13 MED ORDER — FENTANYL CITRATE (PF) 100 MCG/2ML IJ SOLN
INTRAMUSCULAR | Status: DC | PRN
  Administered 2017-04-13: 25 ug via INTRAVENOUS

## 2017-04-13 MED ORDER — MIDAZOLAM HCL 2 MG/2ML IJ SOLN
INTRAMUSCULAR | Status: DC | PRN
  Administered 2017-04-13: 2 mg via INTRAVENOUS

## 2017-04-13 MED ORDER — ASPIRIN 81 MG PO TBEC: 81.0000 mg | DELAYED_RELEASE_TABLET | Freq: Every day | ORAL | 0 refills | Status: AC

## 2017-04-13 MED ORDER — VERAPAMIL HCL 2.5 MG/ML IV SOLN
INTRAVENOUS | Status: DC | PRN
  Administered 2017-04-13: 10 mL via INTRA_ARTERIAL

## 2017-04-13 MED ORDER — METOPROLOL TARTRATE 25 MG PO TABS
12.5000 mg | ORAL_TABLET | Freq: Two times a day (BID) | ORAL | 0 refills | Status: DC
Start: 2017-04-13 — End: 2017-12-03

## 2017-04-13 MED ORDER — HEPARIN BOLUS VIA INFUSION
3000.0000 [IU] | Freq: Once | INTRAVENOUS | Status: AC
Start: 2017-04-13 — End: 2017-04-13
  Administered 2017-04-13: 3000 [IU] via INTRAVENOUS
  Filled 2017-04-13: qty 3000

## 2017-04-13 MED ORDER — IOPAMIDOL (ISOVUE-370) INJECTION 76%
INTRAVENOUS | Status: AC
  Filled 2017-04-13: qty 100

## 2017-04-13 MED ORDER — ASPIRIN EC 81 MG PO TBEC: 81.0000 mg | DELAYED_RELEASE_TABLET | Freq: Every day | ORAL | Status: DC

## 2017-04-13 MED ORDER — FUROSEMIDE 10 MG/ML IJ SOLN
80.0000 mg | Freq: Once | INTRAMUSCULAR | Status: AC
  Administered 2017-04-13: 80 mg via INTRAVENOUS
  Filled 2017-04-13: qty 8

## 2017-04-13 MED ORDER — SODIUM CHLORIDE 0.9% FLUSH: 3.0000 mL | Freq: Two times a day (BID) | INTRAVENOUS | Status: DC

## 2017-04-13 MED ORDER — ONDANSETRON HCL 4 MG/2ML IJ SOLN: 4.0000 mg | Freq: Four times a day (QID) | INTRAMUSCULAR | Status: DC | PRN

## 2017-04-13 MED ORDER — FENTANYL CITRATE (PF) 100 MCG/2ML IJ SOLN
INTRAMUSCULAR | Status: AC
  Filled 2017-04-13: qty 2

## 2017-04-13 MED ORDER — ATORVASTATIN CALCIUM 40 MG PO TABS: 40.0000 mg | ORAL_TABLET | Freq: Every day | ORAL | Status: DC

## 2017-04-13 MED ORDER — IOPAMIDOL (ISOVUE-370) INJECTION 76%
INTRAVENOUS | Status: DC | PRN
  Administered 2017-04-13: 50 mL via INTRA_ARTERIAL

## 2017-04-13 MED ORDER — HEPARIN (PORCINE) IN NACL 2-0.9 UNIT/ML-% IJ SOLN
INTRAMUSCULAR | Status: AC
  Filled 2017-04-13: qty 1000

## 2017-04-13 MED ORDER — SODIUM CHLORIDE 0.9% FLUSH: 3.0000 mL | INTRAVENOUS | Status: DC | PRN

## 2017-04-13 SURGICAL SUPPLY — 12 items
CATH INFINITI 5 FR JL3.5 (CATHETERS) ×1 IMPLANT
CATH INFINITI 5FR ANG PIGTAIL (CATHETERS) ×1 IMPLANT
CATH INFINITI JR4 5F (CATHETERS) ×1 IMPLANT
DEVICE RAD COMP TR BAND LRG (VASCULAR PRODUCTS) ×1 IMPLANT
GLIDESHEATH SLEND SS 6F .021 (SHEATH) ×1 IMPLANT
GUIDEWIRE INQWIRE 1.5J.035X260 (WIRE) IMPLANT
INQWIRE 1.5J .035X260CM (WIRE) ×2
KIT HEART LEFT (KITS) ×2 IMPLANT
PACK CARDIAC CATHETERIZATION (CUSTOM PROCEDURE TRAY) ×2 IMPLANT
SYR MEDRAD MARK V 150ML (SYRINGE) ×2 IMPLANT
TRANSDUCER W/STOPCOCK (MISCELLANEOUS) ×2 IMPLANT
TUBING CIL FLEX 10 FLL-RA (TUBING) ×2 IMPLANT

## 2017-04-13 NOTE — Progress Notes (Signed)
ANTICOAGULATION CONSULT NOTE - Follow Up Consult  Pharmacy Consult for heparin Indication: USAP   Labs: Recent Labs    04/11/17 1834 04/12/17 0458 04/13/17 0434  HGB  --  12.8 12.1  HCT  --  40.1 38.1  PLT  --  118* 102*  LABPROT  --   --  13.3  INR  --   --  1.02  HEPARINUNFRC 0.38 0.31 0.10*  CREATININE  --  0.57 0.55    Assessment: 71yo female now subtherapeutic on heparin after two levels at low end of goal though had been trending down.  Goal of Therapy:  Heparin level 0.3-0.7 units/ml   Plan:  Will rebolus with heparin 3000 units and increase gtt by 3 units/kg/hr to 1300 units/hr and check level in 6hr.  Wynona Neat, PharmD, BCPS  04/13/2017,6:08 AM

## 2017-04-13 NOTE — Progress Notes (Signed)
Progress Note  Patient Name: [name] Date of Encounter: 04/13/2017  Primary Cardiologist: New  Subjective   " I have t ogo to the bathroom"  No Cp  Breathing is fair  Inpatient Medications    Scheduled Meds: . [START ON 04/14/2017] aspirin  81 mg Oral Daily  . atorvastatin  80 mg Oral Q0600  . buPROPion  150 mg Oral q morning - 10a  . calcium-vitamin D  1 tablet Oral Daily  . cholecalciferol  2,000 Units Oral Daily  . citalopram  40 mg Oral q morning - 10a  . levothyroxine  175 mcg Oral QAC breakfast  . metoprolol tartrate  12.5 mg Oral BID  . montelukast  10 mg Oral Daily  . multivitamin with minerals  1 tablet Oral Daily  . nitrofurantoin (macrocrystal-monohydrate)  100 mg Oral Daily  . nystatin cream  1 application Topical BID  . pantoprazole  40 mg Oral q morning - 10a  . sodium chloride flush  3 mL Intravenous Q12H   Continuous Infusions: . sodium chloride    . sodium chloride 1 mL/kg/hr (04/13/17 0730)  . heparin 1,300 Units/hr (04/13/17 6144)   PRN Meds: sodium chloride, acetaminophen, albuterol, ondansetron (ZOFRAN) IV, polyvinyl alcohol, sodium chloride flush   Vital Signs    Vitals:   04/12/17 0811 04/12/17 2146 04/13/17 0530 04/13/17 0838  BP: 123/65 119/64 (!) 123/59 130/71  Pulse: 67 69  65  Resp:  (!) 22 (!) 28   Temp:  98.3 F (36.8 C) 97.9 F (36.6 C)   TempSrc:  Oral Oral   SpO2:  97% 96%   Weight:      Height:        Intake/Output Summary (Last 24 hours) at 04/13/2017 0906 Last data filed at 04/13/2017 0700 Gross per 24 hour  Intake 252.43 ml  Output 2200 ml  Net -1947.57 ml   Filed Weights   04/10/17 0556 04/11/17 0356  Weight: 235 lb 12.8 oz (107 kg) 237 lb 8 oz (107.7 kg)    Telemetry    SR   - Personally Reviewed  ECG      Physical Exam   GEN: Morbidly obese Neck: Neck full   Cardiac: RRR, no murmurs, rubs, or gallops.  Respiratory: Clear to auscultation bilaterally. GI: Soft, nontender, non-distended    MS: No edema; No deformity. Neuro:  Nonfocal  Psych: Normal affect   Labs    Chemistry Recent Labs  Lab 04/09/17 2101 04/12/17 0458 04/13/17 0434  NA 138 139 139  K 3.6 4.0 3.7  CL 106 106 107  CO2 27 26 26   GLUCOSE 111* 107* 107*  BUN 10 11 7   CREATININE 0.56 0.57 0.55  CALCIUM 8.6* 8.9 8.6*  GFRNONAA >60 >60 >60  GFRAA >60 >60 >60  ANIONGAP 5 7 6      Hematology Recent Labs  Lab 04/09/17 2101 04/12/17 0458 04/13/17 0434  WBC 4.9 4.2 4.3  RBC 4.31 4.54 4.27  HGB 12.4 12.8 12.1  HCT 37.8 40.1 38.1  MCV 87.7 88.3 89.2  MCH 28.8 28.2 28.3  MCHC 32.8 31.9 31.8  RDW 13.6 13.5 13.6  PLT 117* 118* 102*    Cardiac Enzymes Recent Labs  Lab 04/10/17 0049 04/10/17 0335 04/10/17 0602  TROPONINI <0.03 <0.03 <0.03    Recent Labs  Lab 04/09/17 2117  TROPIPOC 0.00     BNP Recent Labs  Lab 04/09/17 2101  BNP 253.8*     DDimer No results for input(s): DDIMER  in the last 168 hours.   Radiology    No results found.  Cardiac Studies     Cath pending    Patient Profile     71 y.o. female   Assessment & Plan   1  [place]  Plan for cath today    Cath showed normal coronary arteries  LVEDP was 25 to 30 mm Hg consistent with diastolic CHF    Echo again concurs with this   LVEF normal  Gr II diastolic with increased filling pressures   I would give lasix 80 once  Follow   PT has lasix at home 20  I told her to take in acouple days with 10 KCL  Weigh every am   No added salt  Fluid for thirst    WIll make sure she has f/u app in clinic with me for diastolic dysfunction.    2  PVCs  On metoprolol  3  Bradycardia  Resolved    4  Deconditioning   For questions or updates, please contact [place] Please consult [email or web address] for contact info under Cardiology/STEMI.      Signed, Dorris Carnes, MD  04/13/2017, 9:06 AM

## 2017-04-13 NOTE — Telephone Encounter (Signed)
Patient is currently at [facility], had to cancel her appt and she was wondering if someone could call her and go over the results of the bone scan. If not she said she'll reschedule but shes not sure when shes getting out. Her room phone # is [phone]

## 2017-04-13 NOTE — Discharge Summary (Addendum)
Discharge Summary  [name] RKY:[id number] DOB: [birth year]  PCP: Chesley Noon, MD  Admit date: 04/09/2017 Discharge date: 04/13/2017  Time spent: 25 minutes  Recommendations for Outpatient Follow-up:  1. Follow up with cardiology within a week 2. Follow up with your PCP within a week 3. Take your medications as prescribed 4. Please keep the site of cath insertion clean  Discharge Diagnoses:  Active Hospital Problems   Diagnosis Date Noted  . Chest pain, rule out acute myocardial infarction 04/10/2017  . Shortness of breath   . ACS (acute coronary syndrome) ([place]) 04/11/2017  . Bigeminy 04/10/2017  . Hypothyroid 05/28/2011  . Morbid obesity ([place]) 04/10/2011    Resolved Hospital Problems  No resolved problems to display.    Discharge Condition: Stable  Diet recommendation: Resume previous diet  Vitals:   04/13/17 1040 04/13/17 1646  BP: (!) 116/59 (!) 108/39  Pulse: (!) 57 61  Resp: 14   Temp:    SpO2: 92% 96%    History of present illness:  [name] a 71 y.o.femalewith medical history significant ofobesity, former smoker, overactive bladder. Patient presents to the ED 04/09/17 with c/o chest pain for the past 1 week. Palpitations over the past 3 days especially. Noted to have palpable pulse of 45 at urology office visit 04/09/17 and sent to her PCPs office. Noted to have numerous PVCs on EKG at PCPs office, and sent to ED. In the ED patient's CP is improved with NTG, her EKG shows predominately a bigeminy rhythm with an electrical rate in the 80s, however her palpable radial pulse is in the low 40s. Asymptomatic.  Left heart cath planned and completed 04/13/17 by cardiology. Grade 2 diastolic dysfunction. Recommendations for medical management.  Bradycardia resolved.  On the day of discharge the patient was hemodynamically stable. She was chest pain free. She will follow up with cardiology and her PCP within a week.   Hospital Course:    Principal Problem:   Chest pain, rule out acute myocardial infarction Active Problems:   Morbid obesity ([place])   Hypothyroid   Bigeminy   ACS (acute coronary syndrome) ([facility])   Shortness of breath  Chest pain, ACS ruled out - chest pain has resolved - Troponin negative x3; EKG ventricular bigeminy in the rate of 95.  - nuclear stress done 04/10/17 with suspicion for moderate sized infarct affecting left ventricle apex.heparin drip.  - left heart cath on Monday 04/13/17. - cardiology following, we appreciate recommendations. - asa, lipitor 40 mg, metoprolol 12.5 mg daily  Grade 2 diastolic dysfunction/ heart failure with preserved EF 60-65% -Per left heart cath 04/13/17 -continue lasix  -continue asa, lipitor and metoprolol  Ventricular bigeminy -cardiology following -Cardiology added metoprolol 12.5 mg BID 04/10/17  Anxiety/depression -stable -bupropion, celexa  Hypothyroidism -levothyroxine  GERD -stable -protonix po 40 mg daily   Procedures:  Left heart cath 04/13/17  Consultations:  cardiology  Discharge Exam: BP (!) 108/39 (BP Location: Left Arm)   Pulse 61   Temp 97.9 F (36.6 C) (Oral)   Resp 14   Ht 5\' 5"  (1.651 m)   Wt 107.7 kg (237 lb 8 oz)   SpO2 96%   BMI 39.52 kg/m    General: 71 year old obese female laying in bed in NAD. A&O x 4.   Cardiovascular: RRR with no murmurs rubs or gallops  Respiratory: CTA with no wheezes rales or rhonchi  Abdomen: Soft obese NT ND with NBS x 4 quadrants   Musculoskeletal:  scars noted on knee bilaterally from previous surgeries with deformity noted on left knee  Skin: Site of radial cath access appears clean with no sign of bleeding  Psychiatry: Mood is appropriate for condition and setting   Discharge Instructions You were cared for by a hospitalist during your hospital stay. If you have any questions about your discharge medications or the care you received while you were in the hospital after  you are discharged, you can call the unit and asked to speak with the hospitalist on call if the hospitalist that took care of you is not available. Once you are discharged, your primary care physician will handle any further medical issues. Please note that NO REFILLS for any discharge medications will be authorized once you are discharged, as it is imperative that you return to your primary care physician (or establish a relationship with a primary care physician if you do not have one) for your aftercare needs so that they can reassess your need for medications and monitor your lab values.   Allergies as of 04/13/2017      Reactions   Codeine Other (See Comments)   hallucinations Hyper/ Hallucinations    Demerol [meperidine] Other (See Comments)   "Knocks me out"   Diazepam Other (See Comments)   Extreme sedation; patient does not recall taking diazepam (12/07/14)   Tape Hives, Other (See Comments)   "thick clear plastic tape" causes blisters   Morphine And Related Itching, Rash   Per CRNA, pt tol Fentanyl IV for OR case, no issues reported to rec RN in PACU, no immediate PACU issues noted as well upon arrival   Penicillins Hives, Swelling   Valisone [betamethasone] Rash      Medication List    TAKE these medications   albuterol 108 (90 Base) MCG/ACT inhaler Commonly known as:  PROVENTIL HFA;VENTOLIN HFA Inhale 2 puffs into the lungs every 4 (four) hours as needed for wheezing or shortness of breath. Reported on 08/01/2015   aspirin 81 MG EC tablet Take 1 tablet (81 mg total) daily by mouth.   atorvastatin 40 MG tablet Commonly known as:  LIPITOR Take 1 tablet (40 mg total) daily at 6 PM by mouth.   buPROPion 150 MG 24 hr tablet Commonly known as:  WELLBUTRIN XL Take 150 mg by mouth every morning.   Calcium Carbonate-Vitamin D 600-125 MG-UNIT Tabs Take 1 tablet by mouth daily.   citalopram 40 MG tablet Commonly known as:  CELEXA Take 40 mg by mouth every morning.     furosemide 20 MG tablet Commonly known as:  LASIX Take 20 mg daily by mouth.   ibandronate 150 MG tablet Commonly known as:  BONIVA Take 150 mg every 30 (thirty) days by mouth. Take in the morning with a full glass of water, on an empty stomach, and do not take anything else by mouth or lie down for the next 30 min.   metoprolol tartrate 25 MG tablet Commonly known as:  LOPRESSOR Take 0.5 tablets (12.5 mg total) 2 (two) times daily by mouth.   montelukast 10 MG tablet Commonly known as:  SINGULAIR Take 10 mg daily by mouth.   multivitamin with minerals Tabs tablet Take 1 tablet daily by mouth.   nystatin cream Commonly known as:  MYCOSTATIN Apply 1 application topically 2 (two) times daily.   Omega-3 1000 MG Caps Take 2 g daily by mouth.   pantoprazole 40 MG tablet Commonly known as:  PROTONIX Take 40 mg by mouth every  morning.   polyvinyl alcohol 1.4 % ophthalmic solution Commonly known as:  LIQUIFILM TEARS Place 1 drop into both eyes as needed for dry eyes.   SYNTHROID 175 MCG tablet Generic drug:  levothyroxine TAKE 1 TABLET BY MOUTH EVERY DAY   Vitamin D3 1000 units Caps Take 2 capsules by mouth daily.      Allergies  Allergen Reactions  . Codeine Other (See Comments)    hallucinations Hyper/ Hallucinations   . Demerol [Meperidine] Other (See Comments)    "Knocks me out"  . Diazepam Other (See Comments)    Extreme sedation; patient does not recall taking diazepam (12/07/14)  . Tape Hives and Other (See Comments)    "thick clear plastic tape" causes blisters  . Morphine And Related Itching and Rash    Per CRNA, pt tol Fentanyl IV for OR case, no issues reported to rec RN in PACU, no immediate PACU issues noted as well upon arrival  . Penicillins Hives and Swelling  . Valisone [Betamethasone] Rash   Follow-up Information    Fay Records, MD Follow up.   Specialty:  Cardiology Why:  On December 6th at 11:20 for cardiology hospital follow up. Contact  information: [place] [phone]            The results of significant diagnostics from this hospitalization (including imaging, microbiology, ancillary and laboratory) are listed below for reference.    Significant Diagnostic Studies: Dg Chest 2 View  Result Date: 04/09/2017 CLINICAL DATA:  71 year old female with shortness of breath and chest pain for 1 week. EXAM: CHEST  2 VIEW COMPARISON:  10/04/2015 chest radiograph and prior studies FINDINGS: The cardiomediastinal silhouette is unremarkable. Elevation of the right hemidiaphragm again noted. There is no evidence of focal airspace disease, pulmonary edema, suspicious pulmonary nodule/mass, pleural effusion, or pneumothorax. No acute bony abnormalities are identified. IMPRESSION: No evidence of acute cardiopulmonary disease. Electronically Signed   By: Margarette [place] M.D.   On: 04/09/2017 21:52   Ct Angio Chest Pe W And/or Wo Contrast  Result Date: 04/09/2017 CLINICAL DATA:  Shortness of breath and chest pain EXAM: CT ANGIOGRAPHY CHEST WITH CONTRAST TECHNIQUE: Multidetector CT imaging of the chest was performed using the standard protocol during bolus administration of intravenous contrast. Multiplanar CT image reconstructions and MIPs were obtained to evaluate the vascular anatomy. CONTRAST:  186mL ISOVUE-370 IOPAMIDOL (ISOVUE-370) INJECTION 76% COMPARISON:  04/09/2017, 11/06/2015 FINDINGS: Cardiovascular: Satisfactory opacification of the pulmonary arteries to the segmental level. No evidence of pulmonary embolism. Mild atherosclerotic calcification of the aorta. No aneurysmal dilatation. Minimal coronary artery calcification. Mild cardiomegaly. No pericardial effusion. Mediastinum/Nodes: No enlarged mediastinal, hilar, or axillary lymph nodes. Thyroid gland, trachea, and esophagus demonstrate no significant findings. Lungs/Pleura: Lungs are clear. No pleural effusion or pneumothorax. Upper  Abdomen: No acute abnormality. Musculoskeletal: Degenerative changes. No acute or suspicious bone lesion. 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 IMPRESSION: Negative for acute pulmonary embolus.  Clear lung fields. Aortic Atherosclerosis (ICD10-I70.0). Electronically Signed   By: Donavan Foil M.D.   On: 04/09/2017 23:08   Nm Myocar Multi W/spect W/wall Motion / Ef  Result Date: 04/10/2017 CLINICAL DATA:  Chest pain. Former smoker. History of asthma. Family history of CAD. EXAM: MYOCARDIAL IMAGING WITH SPECT (REST AND PHARMACOLOGIC-STRESS) GATED LEFT VENTRICULAR WALL MOTION STUDY LEFT VENTRICULAR EJECTION FRACTION TECHNIQUE: Standard myocardial SPECT imaging was performed after resting intravenous injection of 10 mCi Tc-32m tetrofosmin.  Subsequently, intravenous infusion of Lexiscan was performed under the supervision of the Cardiology staff. At peak effect of the drug, 30 mCi Tc-56m tetrofosmin was injected intravenously and standard myocardial SPECT imaging was performed. Quantitative gated imaging was also performed to evaluate left ventricular wall motion, and estimate left ventricular ejection fraction. COMPARISON:  Chest radiograph -04/09/2017; chest CT - 04/09/2017 FINDINGS: Raw images: Mild chest and breast wall attenuation is seen on both provided rest and stress images. No significant diaphragmatic attenuation. No significant patient motion artifact. Perfusion: There is a moderate-sized area of matched non profusion involving the left ventricular apex worrisome for prior infarction. There is a small area of mismatched decreased profusion involving the mid inferolateral aspect of the left ventricle worrisome for a small area of pharmacologically induced ischemia Wall Motion: The left ventricle is dilated. Global hypokinesia with relative akinesia involving the septum. Left Ventricular Ejection Fraction: 42 % End diastolic volume 062 ml End systolic  volume 82 ml IMPRESSION: 1. Suspected moderate sized infarct involving the left ventricular apex. 2. Potential small area of pharmacologically induced ischemia involving the mid aspect of the inferolateral wall left ventricle. 3. Ventriculomegaly. 4. Global hypokinesia with relative akinesia involving the septum. Ejection fraction - 42%. Electronically Signed   By: Sandi Mariscal M.D.   On: 04/10/2017 16:01   Dg Foot 2 Views Right  Result Date: 03/19/2017 Please see detailed radiograph report in office note.  Nm Bone Scan 3 Phase Lower Extremity  Result Date: 04/09/2017 CLINICAL DATA:  Left Knee pain. History of bilateral knee prosthesis. EXAM: NUCLEAR MEDICINE 3-PHASE BONE SCAN TECHNIQUE: Radionuclide angiographic images, immediate static blood pool images, and 3-hour delayed static images were obtained of the bilateral lower extremities after intravenous injection of radiopharmaceutical. RADIOPHARMACEUTICALS:  20.0 mCi Tc-28m MDP COMPARISON:  Knee radiographs 03/26/2017 FINDINGS: Vascular phase: Asymmetric blood flow to the right lower extremity. Blood pool phase: Asymmetric activity noted around the right tibial prosthesis. Delayed phase: Asymmetric activity noted around the right tibial prosthesis. 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 This is discordant with the patient's pain. I do not see any obvious findings for loosening involving the left prosthesis. Electronically Signed   By: Marijo Sanes M.D.   On: 04/09/2017 10:00   Xr Knee 1-2 Views Left  Result Date: 03/29/2017 AP lateral left knee reviewed.  Revision femoral and tibial components in good position alignment with no definite evidence of loosening ostial lysis or osteomyelitis.  No complicating features to the hardware   Microbiology: No results found for this or any previous visit (from the past 240 hour(s)).   Labs: Basic Metabolic Panel: Recent Labs  Lab  04/09/17 2101 04/09/17 2338 04/12/17 0458 04/13/17 0434  NA 138  --  139 139  K 3.6  --  4.0 3.7  CL 106  --  106 107  CO2 27  --  26 26  GLUCOSE 111*  --  107* 107*  BUN 10  --  11 7  CREATININE 0.56  --  0.57 0.55  CALCIUM 8.6*  --  8.9 8.6*  MG  --  1.8  --   --   PHOS  --  3.2  --   --    Liver Function Tests: No results for input(s): AST, ALT, ALKPHOS, BILITOT, PROT, ALBUMIN in the last 168 hours. No results for input(s): LIPASE, AMYLASE in the last 168 hours. No results for input(s): AMMONIA in the last 168 hours. CBC: Recent  Labs  Lab 04/09/17 2101 04/12/17 0458 04/13/17 0434  WBC 4.9 4.2 4.3  HGB 12.4 12.8 12.1  HCT 37.8 40.1 38.1  MCV 87.7 88.3 89.2  PLT 117* 118* 102*   Cardiac Enzymes: Recent Labs  Lab 04/10/17 0049 04/10/17 0335 04/10/17 0602  TROPONINI <0.03 <0.03 <0.03   BNP: BNP (last 3 results) Recent Labs    04/09/17 2101  BNP 253.8*    ProBNP (last 3 results) No results for input(s): PROBNP in the last 8760 hours.  CBG: No results for input(s): GLUCAP in the last 168 hours.     Signed:  Kayleen Memos, MD [facility] 04/13/2017, 5:09 PM

## 2017-04-13 NOTE — Interval H&P Note (Signed)
Cath Lab Visit (complete for each Cath Lab visit)  Clinical Evaluation Leading to the Procedure:   ACS: Yes.    Non-ACS:    Anginal Classification: CCS IV  Anti-ischemic medical therapy: Minimal Therapy (1 class of medications)  Non-Invasive Test Results: No non-invasive testing performed  Prior CABG: No previous CABG      History and Physical Interval Note:  04/13/2017 9:47 AM  [name]  has presented today for surgery, with the diagnosis of as  The various methods of treatment have been discussed with the patient and family. After consideration of risks, benefits and other options for treatment, the patient has consented to  Procedure(s): LEFT HEART CATH AND CORONARY ANGIOGRAPHY (N/A) as a surgical intervention .  The patient's history has been reviewed, patient examined, no change in status, stable for surgery.  I have reviewed the patient's chart and labs.  Questions were answered to the patient's satisfaction.     Larae Grooms

## 2017-04-13 NOTE — Telephone Encounter (Signed)
Please advise thanks.

## 2017-04-13 NOTE — Telephone Encounter (Signed)
Labs negative for infxn - bone scan neg for loosening left leg tka - no clear cut easy explanation for left leg pain relating to tka - I called

## 2017-04-13 NOTE — Discharge Instructions (Signed)

## 2017-04-27 ENCOUNTER — Ambulatory Visit (INDEPENDENT_AMBULATORY_CARE_PROVIDER_SITE_OTHER): Payer: Medicare Other | Admitting: Orthopedic Surgery

## 2017-04-27 ENCOUNTER — Ambulatory Visit (INDEPENDENT_AMBULATORY_CARE_PROVIDER_SITE_OTHER): Payer: Medicare Other

## 2017-04-27 ENCOUNTER — Encounter (INDEPENDENT_AMBULATORY_CARE_PROVIDER_SITE_OTHER): Payer: Self-pay | Admitting: Orthopedic Surgery

## 2017-04-27 DIAGNOSIS — M25512 Pain in left shoulder: Principal | ICD-10-CM

## 2017-04-27 DIAGNOSIS — G8929 Other chronic pain: Secondary | ICD-10-CM

## 2017-04-27 MED ORDER — BACLOFEN 10 MG PO TABS: 10.0000 mg | ORAL_TABLET | Freq: Two times a day (BID) | ORAL | 0 refills | Status: DC

## 2017-04-30 ENCOUNTER — Ambulatory Visit: Payer: Medicare Other | Admitting: Internal Medicine

## 2017-05-01 DIAGNOSIS — M25512 Pain in left shoulder: Secondary | ICD-10-CM | POA: Diagnosis not present

## 2017-05-01 DIAGNOSIS — G8929 Other chronic pain: Secondary | ICD-10-CM | POA: Diagnosis not present

## 2017-05-01 MED ORDER — METHYLPREDNISOLONE ACETATE 40 MG/ML IJ SUSP
40.0000 mg | INTRAMUSCULAR | Status: AC | PRN
  Administered 2017-05-01: 40 mg via INTRA_ARTICULAR

## 2017-05-01 MED ORDER — BUPIVACAINE HCL 0.5 % IJ SOLN
9.0000 mL | INTRAMUSCULAR | Status: AC | PRN
  Administered 2017-05-01: 9 mL via INTRA_ARTICULAR

## 2017-05-01 MED ORDER — LIDOCAINE HCL 1 % IJ SOLN
5.0000 mL | INTRAMUSCULAR | Status: AC | PRN
  Administered 2017-05-01: 5 mL

## 2017-05-01 NOTE — Progress Notes (Signed)
Office Visit Note   Patient: [name]           Date of Birth: [birth year]           MRN: [id number] Visit Date: 04/27/2017 Requested by: Chesley Noon, MD [place], [place] [address] PCP: Chesley Noon, MD  Subjective: Chief Complaint  Patient presents with  . Follow-up    review bone scan    HPI: [name] is a patient here for follow-up of bone scan.  Pain in the left knee.  Bone scan shows no evidence of loosening of the left knee.  There is some mild uptake in the right knee but she is asymptomatic on that side.  He does report having a fall on the left shoulder and has had having pain in that left shoulder region.  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              ROS: All systems reviewed are negative as they relate to the chief complaint within the history of present illness.  Patient denies  fevers or chills.   Assessment & Plan: Visit Diagnoses:  1. Chronic left shoulder pain     Plan: Impression is left shoulder aggravation of arthritis.  Plan is baclofen and left shoulder injection.  Left knee requires no intervention at this time.  No laboratory evidence of infection and no bone scan evidence of loosening.  Right knee is asymptomatic.  Muscle relaxer prescribed.  Follow-up as needed  Follow-Up Instructions: Return if symptoms worsen or fail to improve.   Orders:  Orders Placed This Encounter  Procedures  . XR Shoulder Left   Meds ordered this encounter  Medications  . baclofen (LIORESAL) 10 MG tablet    Sig: Take 1 tablet (10 mg total) by mouth 2 (two) times daily.    Dispense:  60 each    Refill:  0      Procedures: Large Joint Inj: L glenohumeral on 05/01/2017 11:08 PM Indications: diagnostic evaluation and pain Details: 18 G 1.5 in needle, posterior approach  Arthrogram: No  Medications: 9 mL bupivacaine 0.5 %; 40 mg methylPREDNISolone acetate 40 MG/ML; 5 mL lidocaine 1 % Outcome: tolerated well, no  immediate complications Procedure, treatment alternatives, risks and benefits explained, specific risks discussed. Consent was given by the patient. Immediately prior to procedure a time out was called to verify the correct patient, procedure, equipment, support staff and site/side marked as required. Patient was prepped and draped in the usual sterile fashion.       Clinical Data: No additional findings.  Objective: Vital Signs: There were no vitals taken for this visit.  Physical Exam:   Constitutional: Patient appears well-developed HEENT:  Head: Normocephalic Eyes:EOM are normal Neck: Normal range of motion Cardiovascular: Normal rate Pulmonary/chest: Effort normal Neurologic: Patient is alert Skin: Skin is warm Psychiatric: Patient has normal mood and affect    Ortho Exam: Orthopedic exam demonstrates reasonable rotator cuff strength on the left but severely limited range of motion with external rotation or forward flexion and abduction.  Biceps and triceps strength is intact and motor sensory function to the hand is intact on the left.  She has unchanged lower extremity exam.  No real pain with range of motion of the right knee.  No groin pain with internal/external rotation of that left leg.  Knee itself on the left is not warm.  Specialty Comments:  No specialty comments available.  Imaging: No  results found.   PMFS History: Patient Active Problem List   Diagnosis Date Noted  . Shortness of breath   . ACS (acute coronary syndrome) ([place]) 04/11/2017  . Chest pain, rule out acute myocardial infarction 04/10/2017  . Bigeminy 04/10/2017  . Thrombocytopenic disorder ([place]) 02/25/2017  . Osteoporosis 12/15/2016  . 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 Incomplete emptying of bladder 07/30/2016  . Left foot pain 12/11/2015  . 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 Stopped smoking with greater than 40 pack year history  10/23/2015  . 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 Mixed incontinence urge and stress 09/16/2011  . Cancer of right breast, stage 2 ([place]) 05/28/2011  . 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 Hypothyroid 05/28/2011  . GERD (gastroesophageal reflux disease) 05/28/2011  . Fibromyalgia 05/28/2011  . 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 Mild depression ([place]) 05/28/2011  . Thrombocytopenia, unspecified ([place]) 05/28/2011  . Morbid obesity ([place]) 04/10/2011  . GERD 11/09/2009  . ALLERGIC RHINITIS 08/23/2007  . ASTHMA 08/23/2007  . COUGH 08/23/2007  . SKIN CANCER, HX OF 08/23/2007   Past Medical History:  Diagnosis Date  . Anxiety   . Arthritis   . Asthma   . Chronic constipation   . Depression   . Fibromyalgia   . GERD (gastroesophageal reflux disease)   . 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 History of colon polyps    2007 hyperplagia  . History of esophageal dilatation    2008  . History of peptic ulcer   . History of small bowel obstruction    2012  . Hypothyroidism   . Macular degeneration of right eye   . Numbness of left foot    4 toes  . Paget's disease of vulva   . Personal history of chemotherapy 1997  . Personal history of radiation therapy 2005  . PONV (postoperative nausea and vomiting)    and HARD TO WAKE  . Seasonal allergies   . SUI (stress urinary incontinence, female)   . Wears glasses     Family History  Problem Relation Age of Onset  . Cancer Father 46       lung and kidney   . Alzheimer's disease Mother   . Heart attack Maternal Grandmother   . Alcohol abuse Brother   . Breast cancer Paternal Aunt   . Breast cancer Cousin   . Breast cancer Cousin     Past Surgical History:  Procedure Laterality Date  . BLADDER SUSPENSION  10/13/2011   Procedure:  TRANSVAGINAL TAPE (TVT) PROCEDURE;  Surgeon: Delice Lesch, MD;  Location: [facility];  Service: Gynecology;  Laterality: N/A;  . BREAST BIOPSY Left 03/19/2004  . BREAST LUMPECTOMY Left 03-27-2004  . CARDIAC CATHETERIZATION  10-30-2000  dr Wynonia Lawman   normal coronary arteries and LVF/  ef 65-70%  . CLOSED MANIPULATION  W/ OPEN REDUCTION EXCHANGE TIBIAL FEMORAL BEARING POST TOTAL LEFT KNEE  04-14-2001  . CYSTOSCOPY  10/13/2011   Procedure: CYSTOSCOPY;  Surgeon: Delice Lesch, MD;  Location: [facility];  Service: Gynecology;  Laterality: Bilateral;  . DILATION AND CURETTAGE OF UTERUS  1968  . FOOT SURGERY Left 02-01-2009   TRIPLE ARTHRODESIS  . FOOT SURGERY Left 11/14   I&D left  foot with woundvac placement  . INCISIONAL HERNIA REPAIR  05/05/2011   Procedure: LAPAROSCOPIC INCISIONAL HERNIA;  Surgeon: Edward Jolly, MD;  Location: [facility];  Service: General;  Laterality: N/A;  LAPAROSCOPIC REPAIR INCARCERATED INCISIONAL HERNIA  WITH MESH  . KNEE ARTHROSCOPY Bilateral left [id number] & 04-1992/   right [id number]  . LAPAROSCOPIC CHOLECYSTECTOMY  06-21-2010  . LEFT HEART CATH AND CORONARY ANGIOGRAPHY N/A 04/13/2017   Procedure: LEFT HEART CATH AND CORONARY ANGIOGRAPHY;  Surgeon: Jettie Booze, MD;  Location: [facility];  Service: Cardiovascular;  Laterality: N/A;  . LUMBAR SPINE SURGERY  1998   L5 -- S1  . MASTECTOMY Right 1997   W/  LYMPH NODE DISSECTION AND RECONSTRUCTION WITH IMPLANTS AND EXPANDER  . NEGATIVE SLEEP STUDY  12/26/2015  . PORT-A-CATH PLACEMENT AND REMOVAL  1997  . REMOVAL RIGHT BREAST IMPLANT AND CAPSULECTOMY  06-03-1999  . REVISION TOTAL KNEE ARTHROPLASTY Left 07-07-2005  &  12-10-2001   12-10-2001  REMOVAL TOTAL KNEE/ I&D / INSERTION ANTIBIOTIC SPACER  . REVISION TOTAL KNEE ARTHROPLASTY Right 07-07-2005  . ROTATOR CUFF REPAIR Right   . TENDON REPAIR RIGHT RING FINGER  2011  . THUMB TRIGGER RELEASE Right 1993  . THYROIDECTOMY, PARTIAL  1983  . TONSILLECTOMY  1968   . TOTAL KNEE ARTHROPLASTY Bilateral right 05-04-2000/  left 03-29-2001  . VULVECTOMY  05/27/2012   Procedure: WIDE EXCISION VULVECTOMY;  Surgeon: Imagene Gurney A. Alycia Rossetti, MD;  Location: [facility];  Service: Gynecology;  Laterality: N/A;  Wide Local Excision of Vulva   . VULVECTOMY N/A 11/23/2012   Procedure: WIDE LOCAL EXCISION OF THE VULVA;  Surgeon: Imagene Gurney A. Alycia Rossetti, MD;  Location: [facility];  Service: Gynecology;  Laterality: N/A;  left inferior vulvar biopsy excision left superior lesion left inferior vulvar excision  . VULVECTOMY N/A 08/02/2013   Procedure: WIDE LOCAL  EXCISION VULVA;  Surgeon: Imagene Gurney A. Alycia Rossetti, MD;  Location: [facility];  Service: Gynecology;  Laterality: N/A;  . VULVECTOMY Left 03/29/2014   Procedure: WIDE LOCAL EXCISION OF LEFT VULVA ;  Surgeon: Imagene Gurney A. Alycia Rossetti, MD;  Location: [facility];  Service: Gynecology;  Laterality: Left;  Marland Kitchen VULVECTOMY Bilateral 01/24/2015   Procedure: WIDE LOCAL EXCISION VULVA;  Surgeon: [name], MD;  Location: [facility];  Service: Gynecology;  Laterality: Bilateral;  . VULVECTOMY PARTIAL  07-16-2010   Social History   Occupational History  . Not on file  Tobacco Use  . Smoking status: Former Smoker    Packs/day: 2.00    Years: 41.00    Pack years: 82.00    Types: Cigarettes    Last attempt to quit: 06/05/1999    Years since quitting: 17.9  . Smokeless tobacco: Never Used  Substance and Sexual Activity  . Alcohol use: No  . Drug use: No  . Sexual activity: Not on file

## 2017-05-21 ENCOUNTER — Other Ambulatory Visit (INDEPENDENT_AMBULATORY_CARE_PROVIDER_SITE_OTHER): Payer: Self-pay

## 2017-05-21 MED ORDER — BACLOFEN 10 MG PO TABS: 10.0000 mg | ORAL_TABLET | Freq: Two times a day (BID) | ORAL | 0 refills | Status: DC

## 2017-06-29 ENCOUNTER — Telehealth: Payer: Self-pay | Admitting: *Deleted

## 2017-06-29 NOTE — Telephone Encounter (Signed)
Patient called and scheduled a follow up appt with Dr. Alycia Rossetti. Explained to the patient that Dr.Gehrig will no longer be at this office, only [facility]. Patient has seen both Dr. Skeet Latch and Dr. Fermin Schwab in the past. Patient decided to take a follow up appt with Dr. Fermin Schwab on February 12th. Explained to the patient that if she changes her mind before then and wants to follow Dr.Gehrig to call us back. Also explained that [facility] has the same computer system, so need to worry about transfer of medical records.

## 2017-07-06 ENCOUNTER — Telehealth: Payer: Self-pay | Admitting: *Deleted

## 2017-07-06 NOTE — Telephone Encounter (Signed)
Called and moved the patient's appt form 1:15pm to 10:15am.

## 2017-07-07 ENCOUNTER — Inpatient Hospital Stay: Payer: Medicare HMO | Attending: Gynecology | Admitting: Gynecology

## 2017-07-07 ENCOUNTER — Encounter: Payer: Self-pay | Admitting: Gynecology

## 2017-07-07 VITALS — BP 128/58 | HR 77 | Temp 97.9°F | Resp 20 | Wt 235.0 lb

## 2017-07-07 DIAGNOSIS — C4499 Other specified malignant neoplasm of skin, unspecified: Secondary | ICD-10-CM

## 2017-07-07 DIAGNOSIS — C519 Malignant neoplasm of vulva, unspecified: Secondary | ICD-10-CM

## 2017-07-07 DIAGNOSIS — L989 Disorder of the skin and subcutaneous tissue, unspecified: Secondary | ICD-10-CM | POA: Diagnosis not present

## 2017-07-07 NOTE — Patient Instructions (Addendum)
Plan to go to [facility] for a pre-op appointment on Thursday, February 14 at 1:30pm.  Surgery will be scheduled with Dr. Fermin Schwab at [facility] on July 14, 2017 for a wide local excision of the vulva.

## 2017-07-07 NOTE — Progress Notes (Signed)
Consult Note: Gyn-Onc   [name] 72 y.o. female  Chief Complaint  Patient presents with  . Paget's disease of vulva    Assessment : Long-standing history of Paget's disease of the vulva.  New lesion on the left posterior upper thigh suspicious for a squamous cell carcinoma.  Plan: The patient will continue her current regimen using ointment for her vulvar pruritus.  I am concerned about the lesion on her left posterior upper thigh and we will schedule this to be widely excised on July 14, 2017 at [facility].  She will go to [facility] for preoperative evaluation and pre-care appointment later this week.  Interval History:  Patient returns today as previously scheduled.  Since her last visit with Dr. Alycia Rossetti she has had intermittent pruritus especially on the right upper vulva.  She has been using a ointment for this but is not certain of the name.  (Of asked the patient to call the office and tell us what it is she is taking so that we may renew it)  Patient also complains of a relatively new painful lesion on the left posterior upper thigh.  It bleeds on occasion.  Patient is not certain as to how long it is been present.  HPI:  Patient is a 72 year old with a history of a partial simple vulvectomy in February of 2012 for vulvar Paget's disease. Some of the surgical margins were positive. Her postoperative course was uncomplicated. She was last seen by our service in 12/14. At that time there was no evidence of any lesions consistent with Paget's. However, there is considerable amount of excoriations throughout the entire vulvar and medial thighs consistent with chronic vulvitis.In December 2013 she had a hernia repair due to bowel obstruction by Dr. Excell Seltzer with permanent mesh. In May 2013 she also had a TVT bladder by Dr. Everett Graff. She does continue to wear a pad and also takes Detrol twice daily but states that her urinary leakage is markedly improved. She described the vulvar  irritation is intermittent itching with burning and that it has increased over time as has the pruritus. She does use a steroid cream twice daily and does scratch her vulva.   She underwent a wide local excision of the left vulva on January 2 of year 2014. Operative findings included 2 separate 3 x 3 and 1.2 cm lesions consistent with Paget's on the left.  Pathology revealed 2 foci of extramammary Paget's disease extending to the left inked margin in the right and anterior tip margins at multiple foci. The posterior margin was negative for malignancy.   I performed a biopsy of this it was consistent with recurrent Paget's disease. On 11/23/2012 showed a repeat wide local excision of vulva x2 with the posterior fourchette biopsy. Findings revealed along the left labia minora/vagina is a Paget's disease measuring approximately 2.5 x 1 cm in the superior aspect of her prior left vulvar excision. The inferior aspect of a similar lesion. 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 On the left perineal body the area is somewhat excoriated pale. Biopsy was performed in this area.  Pathology revealed:  1. Vulva, biopsy, left inferior - EXTRAMAMMARY PAGET DISEASE EXTENDING TO THE EDGES OF THE BIOPSY. 2. Vulva, excision, left superior lesion suture marks 1200 - EXTRAMAMMARY PAGET DISEASE. - MARGINS NOT INVOLVED. - CLOSEST MARGIN 9 O'CLOCK, LESS THAN 0.1 CM. 3. Vulva, excision, left inferior excision suture marks 1200 - EXTRAMAMMARY PAGET DISEASE, 12 O'CLOCK MARGIN  FOCALLY INVOLVED. - REMAINING MARGINS CLEAR.  We have discussed repeating surgery however she had multiple orthopedic issues had broken ankle. She was seen by [name] in December 2014. Exam at that time was fairly unremarkable.  I had seen her in January of 2015. At that time exam was concerning for Paget's.   On 08/02/2013 she underwent wide local excisions of the vulva x2.  Operative findings included  pale pink raised lesion measuring 1 x 0.5 cm the left upper vulva near the prior excision site. There is a pale raised lesion measuring 1.5 x 1 cm on the left crossing midline perineal body.  Pathology revealed left superior vulvar excision with extramammary Paget's disease extending to the 9:00 margin. The other margins were negative for tumor. Of the left of midline lesion she extramammary Paget's disease extending to the 12:00 margin and focally at the 6:00 margin but otherwise negative margins. The patient at that time discussed with [place] that she was having some right lower quadrant pain. Her exam is very difficult secondary to habitus. There ultrasound was obtained. Ultrasound revealed:  FINDINGS:  Uterus Measurements: 4.6 x 3.5 x 3.8 cm. 2.5 x 1.7 x 1.6 cm uterine fibroid. This is present in the right lateral aspect of the body uterus.  Endometrium Thickness: 6.1 mm. No focal abnormality visualized.  Right ovary: No focal abnormality.  Left ovary :No focal abnormality.  Other findings: No free fluid  IMPRESSION:  Uterine fibroid otherwise negative exam.  I saw her March 16, 2014 at which time there was an area on the left vulva with hyperkeratosis. A biopsy of that area was performed consistent with Paget's disease. On March 29, 2014 she underwent wide local excision of the vulva. On exam she had a 1 cm left vulvar lesion just below the left labia minora. She underwent a wide local excision. He received a 3.7 x 1.7 x 0.3 cm lesion. There was extramammary Paget's disease extending to the 6 to 9:00 margin in the 12:00 margin. This is despite grossly negative margins on physical examination.   She had an excision in November that revealed Diagnosis Vulva, excision, w/stitch @ 1200 - EXTRAMAMMARY PAGET'S DISEASE, EXTENDING TO THE 6-9 O'CLOCK MARGIN AND 12 O'CLOCK PERPENDICULAR MARGIN.  I saw her in February 2016 at which time there was concern for recurrent disease. She decided to proceed  with Aldara therapy. She used for approximate 4 weeks and then decided to stop it secondary to the expense of the medication. She then started using the clobetasol. While she was using the Aldara all the pruritus in her vulva resolved itself. Since she stopped it and was using the steroid cream the pruritus returned.  I saw her in May 2016 at which time she has small area consistent with Paget's on the right labia minora and a larger patch on the left. Biopsy on the right revealed extramammary Paget's. After discussion she wished to try the Aldara again. She called and requested an earlier appointment as for the last 2-3 weeks had increased itching and has been scratching so much that she's had some bleeding. I last saw her on July 6. At that time she had been having a lot of symptoms of pruritus and scratching a great deal. Vulvar examination at that time revealed a fairly macerated vulva and it was possible to discern what could be related to Paget's, effect of the Aldara therapy, trauma from scratching. She was asked to stop the Aldara therapy and use a low-dose daily  cream. We asked her to refrain from scratching come in today for evaluation.  I saw her again December 26, 2104 at which time there was evidence clearly a Paget's disease and she scheduled for surgery. On January 24, 2015 she underwent bilateral wide local excisions of the vulva. On the right vulva there was a 2 cm patch of Paget's disease in the mid labia majora. On the left side. The remaining labia minora in the area prior to the excision are smaller lesion consistent with Paget's disease.  Diagnosis 1. Vulva, excision, left wide excision - EXTRAMAMMARY PAGET'S DISEASE EXTENDING TO THE 12 O'CLOCK, 3 O'CLOCK, 6 O'CLOCK AND 9 O'CLOCK MARGINS. 2. Vulva, excision, right wide excision - EXTRAMAMMARY PAGET'S EXTENDING TO THE 3 O'CLOCK MARGIN AND THE 6 O'CLOCK POLE.  Please refer to the photograph in the operative note from Dr. Skeet Latch for  complete mapping of her lesion. She still having a little bit of spotting from the biopsy sites and some pain.  The patient initially underwent partial simple vulvectomy in February 2012 for vulvar Paget's disease. Some of her surgical margins were positive. She had an uncomplicated post operative course. She had some evidence of vulvar irritation afterward with symptoms of pruritus and burning that increased over time and for which she was using a steroid cream twice daily. She underwent wide local excision of the left vulva in January 2014. Findings at the time of surgery were to areas consistent with Paget's on the left, one measuring 3 x 3 cm and one measuring 1.2 cm pathology revealed 2 foci of extramammary Paget's disease extending to the left intact margin in the right and anterior tip margins at multiple foci. The patient was seen for follow-up with recurrent Paget's disease noted. In July 2014, she underwent repeat wide local excision of 2 areas in the posterior fourchette biopsy. Findings were an area along the left labium minora measuring 2.5 x 1 cm at the superior aspect of her prior left vulvar excision. There was a similar lesion at the inferior aspect. It appeared that the patient also had lichen sclerosis of the right vulva. Left perineal body biopsy was performed secondary to some noted excoriation. Pathology from the 2 excisions showed extramammary Paget's disease, margins on left superior lesion were negative, margin at 12:00 on inferior lesion focally involved. Perineal body biopsy positive for Paget's disease.   The patient then underwent several orthopedic surgeries and ultimately was taken back to the operating room in March 2015 for 2 wide local excisions of the vulva. Findings at that time included a pill raised pink lesion measuring 1 x 0.5 cm of the left upper vulva near the prior excision site and a second pill raised lesion measuring 1.5 x 1 cm on the left crossing midline perineal  body. Pathology showed that the left superior vulvar excision was Paget's disease extending to the 9:00 margin. Other margins were negative. The left midline lesion also was Paget's disease extending to the 12:00 margin and focally to the 6:00 margin. Patient was then seen back in October 2015 at which time there was an area on the left vulva with hyperkeratosis. A biopsy of that area confirmed Paget's disease. Patient underwent repeat wide local excision of the vulva on March 29, 2014. Findings were a 1 cm left vulvar lesion just below the left labium minora. Pathology showed Paget's disease extending to the 6, 9, and 12:00 margins. This was despite grossly negative margins on physical exam.  Patient was next seen in  February 2016 at which time there was concern for recurrent disease. She decided to proceed with Aldara therapy which she used for 4 weeks and then stopped it secondary to the expense of the medication. Started using clobetasol. Her pruritus stopped with Aldara, but unfortunately returned when she switched to clobetasol. In May 2016 she was seen and had an area that was smaller still consistent with Paget's disease on the right labia minora and a larger patch on the left. Biopsy on the right revealed extramammary Paget's. She opted to try Aldara again. Given evidence of recurrent disease she underwent bilateral wide local excision of the vulva at the end of August 2016. Both wide local excision specimens were positive for Paget's disease with extension to focal margins on each.  Most recently, the patient was taken to the operating room on 01/31/2016 for preoperative mapping of her lesion for her planned wide local excision of the left vulva and smaller excision on the right. Operative findings at that time were notable for surgically absent inferior aspect of the left labium minora and majora. Multiple areas with white flaky skin changes were biopsied and the abnormal-appearing tissue. Changes  noted in the area of the clitoris and perineum and at the introitus just distal to the hymen. Biopsies were collected on all of the sites.   10/17 Findings: 1. Surgical absent left labia majorum inferiorly from prior vulvectomies.  2. Focusing on the left vulva, there was gross disease incorporating the prior surgical site, characterized by patchy white and erythematous epithelium. The affected area spanned from the perineal body to 5-6cm laterally. Prior mapping biopsy sites x3 were well healed and visible and surrounded by what appeared to be grossly normal appearing skin. Superiorly on the left labia majorum, there was affected epithelium with a similar appearing patchy white, erythematous epithelium.  3. Focusing of the right vulva, there was less extensive disease. Affected area extended from the perineal body to roughly 3cm laterally with minimal involvement of the right labia majorum.  4. The urethra, anus, and clitoris were spared.  5. A rectal exam was performed during the procedure to confirm protection of the sphincter, which was intact at the conclusion of our case.  6. A rectocele was notable 3cm into the vagina.   Pathology: Final Diagnosis  A: Vulva, right and left, vulvectomy - (Recurrent/persisent) Paget disease  - Disease size approximately 8.3 x 4.6 cm  - Disease confined to the epidermis - Multiple epidermal margins are positive for disease: 6 o'clock to 11 o'clock (per original orientation)  - Deep margin free of involvement          Review of Systems:10 point review of systems is negative except as noted in interval history.   Vitals: Blood pressure (!) 128/58, pulse 77, temperature 97.9 F (36.6 C), temperature source Oral, resp. rate 20, weight 235 lb (106.6 kg), SpO2 100 %.  Physical Exam: General : The patient is a healthy woman in no acute distress.  HEENT: normocephalic, extraoccular movements normal; neck is supple without thyromegally  Lynphnodes:  Supraclavicular and inguinal nodes not enlarged  Abdomen: Soft, non-tender, no ascites, no organomegally, no masses, no hernias  Pelvic:  EGBUS: Significantly distorted by prior partial vulvectomy's and plastic surgery reconstruction.  No gross lesions are noted.  The area where she has pruritus is intact but slightly erythematous.  This does not appear to have a classic Paget's disease picture at the present time.  At the end of the incision used to create  the flap on the left side is a 2 cm area that is extremely painful and granular in nature (reminiscent of a squamous cell carcinoma) Vagina: Normal, no lesions  Urethra and Bladder: Normal, non-tender   Lower extremities: No edema or varicosities. Normal range of motion      Allergies  Allergen Reactions  . Codeine Other (See Comments)    hallucinations Hyper/ Hallucinations   . Demerol [Meperidine] Other (See Comments)    "Knocks me out"  . Diazepam Other (See Comments)    Extreme sedation; patient does not recall taking diazepam (12/07/14)  . Tape Hives and Other (See Comments)    "thick clear plastic tape" causes blisters  . Macrobid [Nitrofurantoin] Rash  . Morphine And Related Itching and Rash    Per CRNA, pt tol Fentanyl IV for OR case, no issues reported to rec RN in PACU, no immediate PACU issues noted as well upon arrival  . Penicillins Hives and Swelling  . Valisone [Betamethasone] Rash    Past Medical History:  Diagnosis Date  . Anxiety   . Arthritis   . Asthma   . Chronic constipation   . Depression   . Fibromyalgia   . GERD (gastroesophageal reflux disease)   . 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 History of colon polyps    2007 hyperplagia  . History of esophageal dilatation    2008  . History of peptic ulcer   . History of small bowel obstruction    2012   . Hypothyroidism   . Macular degeneration of right eye   . Numbness of left foot    4 toes  . Paget's disease of vulva   . Personal history of chemotherapy 1997  . Personal history of radiation therapy 2005  . PONV (postoperative nausea and vomiting)    and HARD TO WAKE  . Seasonal allergies   . SUI (stress urinary incontinence, female)   . Wears glasses     Past Surgical History:  Procedure Laterality Date  . BLADDER SUSPENSION  10/13/2011   Procedure: TRANSVAGINAL TAPE (TVT) PROCEDURE;  Surgeon: Delice Lesch, MD;  Location: [facility];  Service: Gynecology;  Laterality: N/A;  . BREAST BIOPSY Left 03/19/2004  . BREAST LUMPECTOMY Left 03-27-2004  . CARDIAC CATHETERIZATION  10-30-2000  dr Wynonia Lawman   normal coronary arteries and LVF/  ef 65-70%  . CLOSED MANIPULATION  W/ OPEN REDUCTION EXCHANGE TIBIAL FEMORAL BEARING POST TOTAL LEFT KNEE  04-14-2001  . CYSTOSCOPY  10/13/2011   Procedure: CYSTOSCOPY;  Surgeon: Delice Lesch, MD;  Location: [facility];  Service: Gynecology;  Laterality: Bilateral;  . DILATION AND CURETTAGE OF UTERUS  1968  . FOOT SURGERY Left 02-01-2009   TRIPLE ARTHRODESIS  . FOOT SURGERY Left 11/14   I&D left foot with woundvac placement  . INCISIONAL HERNIA REPAIR  05/05/2011   Procedure: LAPAROSCOPIC INCISIONAL HERNIA;  Surgeon: Edward Jolly, MD;  Location: [facility];  Service: General;  Laterality: N/A;  LAPAROSCOPIC REPAIR INCARCERATED INCISIONAL HERNIA  WITH MESH  . KNEE ARTHROSCOPY Bilateral left [id number] & 04-1992/   right [phone]  . LAPAROSCOPIC CHOLECYSTECTOMY  06-21-2010  . LEFT HEART CATH AND CORONARY ANGIOGRAPHY N/A 04/13/2017   Procedure: LEFT HEART CATH AND CORONARY ANGIOGRAPHY;  Surgeon: Jettie Booze, MD;  Location: [facility];  Service: Cardiovascular;  Laterality: N/A;  . LUMBAR SPINE SURGERY  1998   L5 -- S1  . MASTECTOMY Right 1997   W/  LYMPH NODE DISSECTION AND RECONSTRUCTION WITH IMPLANTS AND EXPANDER  . NEGATIVE SLEEP STUDY   12/26/2015  . PORT-A-CATH PLACEMENT AND REMOVAL  1997  . REMOVAL RIGHT BREAST IMPLANT AND CAPSULECTOMY  06-03-1999  . REVISION TOTAL KNEE ARTHROPLASTY Left 07-07-2005  &  12-10-2001   12-10-2001  REMOVAL TOTAL KNEE/ I&D / INSERTION ANTIBIOTIC SPACER  . REVISION TOTAL KNEE ARTHROPLASTY Right 07-07-2005  . ROTATOR CUFF REPAIR Right   . TENDON REPAIR RIGHT RING FINGER  2011  . THUMB TRIGGER RELEASE Right 1993  . THYROIDECTOMY, PARTIAL  1983  . TONSILLECTOMY  1968  . TOTAL KNEE ARTHROPLASTY Bilateral right 05-04-2000/  left 03-29-2001  . VULVECTOMY  05/27/2012   Procedure: WIDE EXCISION VULVECTOMY;  Surgeon: Imagene Gurney A. Alycia Rossetti, MD;  Location: [facility];  Service: Gynecology;  Laterality: N/A;  Wide Local Excision of Vulva   . VULVECTOMY N/A 11/23/2012   Procedure: WIDE LOCAL EXCISION OF THE VULVA;  Surgeon: Imagene Gurney A. Alycia Rossetti, MD;  Location: [facility];  Service: Gynecology;  Laterality: N/A;  left inferior vulvar biopsy excision left superior lesion left inferior vulvar excision  . VULVECTOMY N/A 08/02/2013   Procedure: WIDE LOCAL  EXCISION VULVA;  Surgeon: Imagene Gurney A. Alycia Rossetti, MD;  Location: [facility];  Service: Gynecology;  Laterality: N/A;  . VULVECTOMY Left 03/29/2014   Procedure: WIDE LOCAL EXCISION OF LEFT VULVA ;  Surgeon: Imagene Gurney A. Alycia Rossetti, MD;  Location: [facility];  Service: Gynecology;  Laterality: Left;  Marland Kitchen VULVECTOMY Bilateral 01/24/2015   Procedure: WIDE LOCAL EXCISION VULVA;  Surgeon: [name], MD;  Location: [facility];  Service: Gynecology;  Laterality: Bilateral;  . VULVECTOMY PARTIAL  07-16-2010    Current Outpatient Medications  Medication Sig Dispense Refill  . albuterol (PROVENTIL HFA;VENTOLIN HFA) 108 (90 BASE) MCG/ACT inhaler Inhale 2 puffs into the lungs every 4 (four) hours as needed for wheezing or shortness of breath. Reported on 08/01/2015    . aspirin EC 81 MG EC tablet Take 1 tablet (81 mg total) daily by mouth. 30 tablet 0  . atorvastatin (LIPITOR) 40  MG tablet Take 1 tablet (40 mg total) daily at 6 PM by mouth. 30 tablet 0  . baclofen (LIORESAL) 10 MG tablet Take 1 tablet (10 mg total) by mouth 2 (two) times daily. 60 each 0  . buPROPion (WELLBUTRIN XL) 150 MG 24 hr tablet Take 150 mg by mouth every morning.     . Calcium Carbonate-Vitamin D 600-125 MG-UNIT TABS Take 1 tablet by mouth daily.     . Cholecalciferol (VITAMIN D3) 1000 UNITS CAPS Take 2 capsules by mouth daily.    . citalopram (CELEXA) 40 MG tablet Take 40 mg by mouth every morning.     . furosemide (LASIX) 20 MG tablet Take 20 mg daily by mouth.    . ibandronate (BONIVA) 150 MG tablet Take 150 mg every 30 (thirty) days by mouth. Take in the morning with a full glass of water, on an empty stomach, and do not take anything else by mouth or lie down for the next 30 min.    Marland Kitchen levothyroxine (SYNTHROID) 175 MCG tablet TAKE 1 TABLET BY MOUTH EVERY DAY    . metoprolol tartrate (LOPRESSOR) 25 MG tablet Take 0.5 tablets (12.5 mg total) 2 (two) times daily by mouth. 60 tablet 0  . montelukast (SINGULAIR) 10 MG  tablet Take 10 mg daily by mouth.     . Multiple Vitamin (MULTIVITAMIN WITH MINERALS) TABS tablet Take 1 tablet daily by mouth.    . nystatin cream (MYCOSTATIN) Apply 1 application topically 2 (two) times daily. 90 g 3  . Omega-3 1000 MG CAPS Take 2 g daily by mouth.     . pantoprazole (PROTONIX) 40 MG tablet Take 40 mg by mouth every morning.     . polyvinyl alcohol (LIQUIFILM TEARS) 1.4 % ophthalmic solution Place 1 drop into both eyes as needed for dry eyes.     No current facility-administered medications for this visit.     Social History   Socioeconomic History  . Marital status: Married    Spouse name: Not on file  . Number of children: Not on file  . Years of education: Not on file  . Highest education level: Not on file  Social Needs  . Financial resource strain: Not on file  . Food insecurity - worry: Not on file  . Food insecurity - inability: Not on file  .  Transportation needs - medical: Not on file  . Transportation needs - non-medical: Not on file  Occupational History  . Not on file  Tobacco Use  . Smoking status: Former Smoker    Packs/day: 2.00    Years: 41.00    Pack years: 82.00    Types: Cigarettes    Last attempt to quit: 06/05/1999    Years since quitting: 18.1  . Smokeless tobacco: Never Used  Substance and Sexual Activity  . Alcohol use: No  . Drug use: No  . Sexual activity: Not on file  Other Topics Concern  . Not on file  Social History Narrative  . Not on file    Family History  Problem Relation Age of Onset  . Cancer Father 65       lung and kidney   . Alzheimer's disease Mother   . Heart attack Maternal Grandmother   . Alcohol abuse Brother   . Breast cancer Paternal Aunt   . Breast cancer Cousin   . Breast cancer Cousin       Marti Sleigh, MD 07/07/2017, 11:34 AM

## 2017-07-14 ENCOUNTER — Telehealth: Payer: Self-pay | Admitting: *Deleted

## 2017-07-14 NOTE — Telephone Encounter (Signed)
Resident from [facility] called and scheduled a post op appt for the patient.

## 2017-07-20 ENCOUNTER — Telehealth (INDEPENDENT_AMBULATORY_CARE_PROVIDER_SITE_OTHER): Payer: Self-pay

## 2017-07-20 MED ORDER — BACLOFEN 10 MG PO TABS: 10.0000 mg | ORAL_TABLET | Freq: Two times a day (BID) | ORAL | 0 refills | Status: DC

## 2017-07-20 NOTE — Addendum Note (Signed)
Addended byLaurann Montana on: 07/20/2017 05:01 PM   Modules accepted: Orders

## 2017-07-20 NOTE — Telephone Encounter (Signed)
Patient request refill on baclofen #180 for 90 day supply [facility]

## 2017-07-20 NOTE — Telephone Encounter (Signed)
Faxed to pharmacy

## 2017-07-20 NOTE — Telephone Encounter (Signed)
ok 

## 2017-08-03 ENCOUNTER — Inpatient Hospital Stay: Payer: Medicare HMO | Attending: Gynecology | Admitting: Gynecologic Oncology

## 2017-08-03 ENCOUNTER — Encounter: Payer: Self-pay | Admitting: Gynecologic Oncology

## 2017-08-03 VITALS — BP 125/65 | HR 82 | Temp 98.4°F | Resp 18 | Ht 65.0 in | Wt 238.7 lb

## 2017-08-03 DIAGNOSIS — C519 Malignant neoplasm of vulva, unspecified: Secondary | ICD-10-CM

## 2017-08-03 DIAGNOSIS — C4499 Other specified malignant neoplasm of skin, unspecified: Secondary | ICD-10-CM

## 2017-08-03 DIAGNOSIS — T8149XA Infection following a procedure, other surgical site, initial encounter: Secondary | ICD-10-CM

## 2017-08-03 MED ORDER — OXYCODONE HCL 5 MG PO TABS: 5.0000 mg | ORAL_TABLET | ORAL | 0 refills | Status: DC | PRN

## 2017-08-03 MED ORDER — CLINDAMYCIN HCL 300 MG PO CAPS: 300.0000 mg | ORAL_CAPSULE | Freq: Three times a day (TID) | ORAL | 0 refills | Status: DC

## 2017-08-03 MED FILL — CLINDAMYCIN HCL 300 MG CAPS: 300 | 7 days supply | Qty: 21 | Fill #0

## 2017-08-03 MED FILL — oxyCODONE HCL 5 MG TABS: 5 | 2 days supply | Qty: 15 | Fill #0

## 2017-08-03 NOTE — Patient Instructions (Signed)
Plan on taking clindamycin three times a day for 7 days.  You can also use neosporin or A&D ointment as needed.  Use the oxycodone for severe pain not relieved by Tylenol or ibuprofen and do not take and drive.  Plan to follow up as scheduled or sooner if needed.

## 2017-08-06 ENCOUNTER — Encounter: Payer: Self-pay | Admitting: Gynecologic Oncology

## 2017-08-06 NOTE — Progress Notes (Signed)
Follow Up Note: Gyn-Onc  [name] 72 y.o. female  CC:  Chief Complaint  Patient presents with  . 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  Per Dr Alycia Rossetti: History of a partial simple vulvectomy in February of 2012 for vulvar Paget's disease. Some of the surgical margins were positive. Her postoperative course was uncomplicated. She was last seen by our service in 12/14. At that time there was no evidence of any lesions consistent with Paget's. However, there is considerable amount of excoriations throughout the entire vulvar and medial thighs consistent with chronic vulvitis.In December 2013 she had a hernia repair due to bowel obstruction by Dr. Excell Seltzer with permanent mesh. In May 2013 she also had a TVT bladder by Dr. Everett Graff. She does continue to wear a pad and also takes Detrol twice daily but states that her urinary leakage is markedly improved. She described the vulvar irritation is intermittent itching with burning and that it has increased over time as has the pruritus. She does use a steroid cream twice daily and does scratch her vulva.   She underwent a wide local excision of the left vulva on January 2 of year 2014. Operative findings included 2 separate 3 x 3 and 1.2 cm lesions consistent with Paget's on the left. Pathology revealed 2 foci of extramammary Paget's disease extending to the left inked margin in the right and anterior tip margins at multiple foci. The posterior margin was negative for malignancy.   A biopsy of this was consistent with recurrent Paget's disease. On 11/23/2012 showed a repeat wide local excision of vulva x2 with the posterior fourchette biopsy. Findings revealed along the left labia minora/vagina is a Paget's disease measuring approximately 2.5 x 1 cm in the superior aspect of her prior left vulvar excision. The inferior aspect of a similar lesion. 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 On the left perineal body the area is somewhat excoriated pale. Biopsy was performed in this area.  Pathology revealed: 1. Vulva, biopsy, left inferior - EXTRAMAMMARY PAGET DISEASE EXTENDING TO THE EDGES OF THE BIOPSY. 2. Vulva, excision, left superior lesion suture marks 1200 - EXTRAMAMMARY PAGET DISEASE. - MARGINS NOT INVOLVED. - CLOSEST MARGIN 9 O'CLOCK, LESS THAN 0.1 CM. 3. Vulva, excision, left inferior excision suture marks 1200 - EXTRAMAMMARY PAGET DISEASE, 12 O'CLOCK MARGIN FOCALLY INVOLVED. - REMAINING MARGINS CLEAR.  We have discussed repeating surgery however she had multiple orthopedic issues had broken ankle. She was seen by [name] in December 2014. Exam at that time was fairly unremarkable. She was seen again in January of 2015 with the exam concerning for Paget's.   On 03/10/2015she underwent wide local excisions of the vulva x2.  Operative findings included pale pink raised lesion measuring 1 x 0.5 cm the left upper vulva near the prior excision site. There is a pale raised lesion measuring 1.5 x 1 cm on the left crossing midline perineal body.  Pathology revealed left superior vulvar excision with extramammary Paget's disease extending to the 9:00 margin. The other margins were negative for tumor. Of the left of midline lesion she extramammary Paget's disease extending to the 12:00 margin and focally at the 6:00 margin but otherwise negative margins. The patient at that time discussed with [place] that she was having some right lower quadrant pain. Her exam is very difficult secondary to  habitus. There ultrasound was obtained. Ultrasound revealed:  FINDINGS:  Uterus Measurements: 4.6 x 3.5 x 3.8 cm. 2.5 x 1.7 x 1.6 cm uterine fibroid. This is present in the right lateral aspect of the body uterus.  Endometrium Thickness: 6.1 mm. No focal abnormality visualized.  Right ovary: No focal abnormality.   Left ovary :No focal abnormality.  Other findings: No free fluid  IMPRESSION:  Uterine fibroid otherwise negative exam.  She was seen on March 16, 2014 at which time there was an area on the left vulva with hyperkeratosis. A biopsy of that area was performed consistent with Paget's disease. On March 29, 2014 she underwent wide local excision of the vulva. On exam she had a 1 cm left vulvar lesion just below the left labia minora. She underwent a wide local excision. He received a 3.7 x 1.7 x 0.3 cm lesion. There was extramammary Paget's disease extending to the 6 to 9:00 margin in the 12:00 margin. This is despite grossly negative margins on physical examination.   She had an excision in November that revealed Diagnosis Vulva, excision, w/stitch @ 1200 - EXTRAMAMMARY PAGET'S DISEASE, EXTENDING TO THE 6-9 O'CLOCK MARGIN AND 12 O'CLOCK PERPENDICULAR MARGIN.  She was then seen in February 2016 at which time there was concern for recurrent disease. She decided to proceed with Aldara therapy. She used for approximate 4 weeks and then decided to stop it secondary to the expense of the medication. She then started using the clobetasol. While she was using the Aldara all the pruritus in her vulva resolved itself. Since she stopped it and was using the steroid cream the pruritus returned.  In May 2016, she had a small area consistent with Paget's on the right labia minora and a larger patch on the left. Biopsy on the right revealed extramammary Paget's. After discussion she wished to try the Aldara again. She called and requested an earlier appointment as for the last 2-3 weeks had increased itching and has been scratching so much that she's had some bleeding. On July 6, she had been having a lot of symptoms of pruritus and scratching a great deal. Vulvar examination at that time revealed a fairly macerated vulva and it was possible to discern what could be related to Paget's, effect of the Aldara  therapy, trauma from scratching. She was asked to stop the Aldara therapy and use a low-dose daily cream. We asked her to refrain from scratching come in today for evaluation.  She was seen again December 26, 2104 at which time there was evidence clearly a Paget's disease and she scheduled for surgery. On August 31, 2016she underwent bilateral wide local excisions of the vulva. On the right vulva there was a 2 cm patch of Paget's disease in the mid labia majora. On the left side. The remaining labia minora in the area prior to the excision are smaller lesion consistent with Paget's disease.  Diagnosis 1. Vulva, excision, left wide excision - EXTRAMAMMARY PAGET'S DISEASE EXTENDING TO THE 12 O'CLOCK, 3 O'CLOCK, 6 O'CLOCK AND 9 O'CLOCK MARGINS. 2. Vulva, excision, right wide excision - EXTRAMAMMARY PAGET'S EXTENDING TO THE 3 O'CLOCK MARGIN AND THE 6 O'CLOCK POLE.  Please refer to the photograph in the operative note from Dr. Skeet Latch for complete mapping of her lesion.   Most recently, the patient was taken to the operating room on 01/31/2016 for preoperative mapping of her lesion for her planned wide local excision of the left vulva and smaller excision on the right. Operative  findings at that time were notable for surgically absent inferior aspect of the left labium minora and majora. Multiple areas with white flaky skin changes were biopsied and the abnormal-appearing tissue. Changes noted in the area of the clitoris and perineum and at the introitus just distal to the hymen. Biopsies were collected on all of the sites.   02/29/16 Findings: 1. Surgical absent left labia majorum inferiorly from prior vulvectomies.  2. Focusing on the left vulva, there was gross disease incorporating the prior surgical site, characterized by patchy white and erythematous epithelium. The affected area spanned from the perineal body to 5-6cm laterally. Prior mapping biopsy sites x3 were well healed and visible and  surrounded by what appeared to be grossly normal appearing skin. Superiorly on the left labia majorum, there was affected epithelium with a similar appearing patchy white, erythematous epithelium.  3. Focusing of the right vulva, there was less extensive disease. Affected area extended from the perineal body to roughly 3cm laterally with minimal involvement of the right labia majorum.  4. The urethra, anus, and clitoris were spared.  5. A rectal exam was performed during the procedure to confirm protection of the sphincter, which was intact at the conclusion of our case.  6. A rectocele was notable 3cm into the vagina.   Pathology: Final Diagnosis  A: Vulva, right and left, vulvectomy - (Recurrent/persisent) Paget disease  - Disease size approximately 8.3 x 4.6 cm  - Disease confined to the epidermis - Multiple epidermal margins are positive for disease: 6 o'clock to 11 o'clock (per original orientation)  - Deep margin free of involvement   She was seen by Dr. Marti Sleigh on 07/07/17 with a new lesion on the left posterior upper thigh noted during examination. On 07/14/17, she underwent an excision of the thigh lesion with Dr. Fermin Schwab at [facility]. Final path resulted: Final Diagnosis  A: Left posterior thigh, excision  - Skin with central area of ulceration, granulation tissue, and scar  - Margins viable and unremarkable  - Negative for malignancy  B: Left posterior thigh deep margin, biopsy  - Benign fibroadipose tissue   Interval History:  She presents today to the office with complaints about incisional drainage, redness, and increased soreness s/p surgery at [facility] with Dr. Fermin Schwab.  She called the office this am with concerns about her surgical site.  She denies fever or chills since surgery.  She states it is hard to keep the surgical site clean and dry due to frequent urinary incontinence and the need to wear a pad.  Her husband has been monitoring the area at home and  he states he feels the area has increased in redness and drainage.  She states the area is very painful at times esp with movement and she is requesting a refill on oxycodone stating Tylenol has not offered any relief and Tramadol does not "work for her."  No other concerns voiced.     Review of Systems: Constitutional: Feels soreness at surgical site.  No fever, chills, early satiety, change in appetite or weight.  Cardiovascular: No chest pain, shortness of breath, or edema.  Pulmonary: No cough or wheeze.  Gastrointestinal: No nausea, vomiting, or diarrhea. No bright red blood per rectum or change in bowel movement.  Genitourinary: Positive for incontinence and takes a fluid pill causing frequent urination. No frequency, urgency, or dysuria. No vaginal bleeding or discharge.  Musculoskeletal: No myalgia or joint pain. Neurologic: No weakness, numbness, or change in gait.  Psychology: No depression,  anxiety, or insomnia.  Current Meds:  Outpatient Encounter Medications as of 08/03/2017  Medication Sig  . albuterol (PROVENTIL HFA;VENTOLIN HFA) 108 (90 BASE) MCG/ACT inhaler Inhale 2 puffs into the lungs every 4 (four) hours as needed for wheezing or shortness of breath. Reported on 08/01/2015  . aspirin EC 81 MG EC tablet Take 1 tablet (81 mg total) daily by mouth.  Marland Kitchen atorvastatin (LIPITOR) 40 MG tablet Take 1 tablet (40 mg total) daily at 6 PM by mouth.  . baclofen (LIORESAL) 10 MG tablet Take 1 tablet (10 mg total) by mouth 2 (two) times daily.  . baclofen (LIORESAL) 10 MG tablet Take 1 tablet (10 mg total) by mouth 2 (two) times daily.  Marland Kitchen buPROPion (WELLBUTRIN XL) 150 MG 24 hr tablet Take 150 mg by mouth every morning.   . Calcium Carbonate-Vitamin D 600-125 MG-UNIT TABS Take 1 tablet by mouth daily.   . Cholecalciferol (VITAMIN D3) 1000 UNITS CAPS Take 2 capsules by mouth daily.  . citalopram (CELEXA) 40 MG tablet Take 40 mg by mouth every morning.   . furosemide (LASIX) 20 MG tablet Take 20  mg daily by mouth.  . ibandronate (BONIVA) 150 MG tablet Take 150 mg every 30 (thirty) days by mouth. Take in the morning with a full glass of water, on an empty stomach, and do not take anything else by mouth or lie down for the next 30 min.  Marland Kitchen levothyroxine (SYNTHROID) 175 MCG tablet TAKE 1 TABLET BY MOUTH EVERY DAY  . metoprolol tartrate (LOPRESSOR) 25 MG tablet Take 0.5 tablets (12.5 mg total) 2 (two) times daily by mouth.  . montelukast (SINGULAIR) 10 MG tablet Take 10 mg daily by mouth.   . Multiple Vitamin (MULTIVITAMIN WITH MINERALS) TABS tablet Take 1 tablet daily by mouth.  . nystatin cream (MYCOSTATIN) Apply 1 application topically 2 (two) times daily.  . Omega-3 1000 MG CAPS Take 2 g daily by mouth.   . pantoprazole (PROTONIX) 40 MG tablet Take 40 mg by mouth every morning.   . polyvinyl alcohol (LIQUIFILM TEARS) 1.4 % ophthalmic solution Place 1 drop into both eyes as needed for dry eyes.  . clindamycin (CLEOCIN) 300 MG capsule Take 1 capsule (300 mg total) by mouth 3 (three) times daily.  Marland Kitchen oxyCODONE (OXY IR/ROXICODONE) 5 MG immediate release tablet Take 1 tablet (5 mg total) by mouth every 4 (four) hours as needed for severe pain.  . [DISCONTINUED] clindamycin (CLEOCIN) 300 MG capsule Take 1 capsule (300 mg total) by mouth 3 (three) times daily.  . [DISCONTINUED] oxyCODONE (OXY IR/ROXICODONE) 5 MG immediate release tablet Take 1 tablet (5 mg total) by mouth every 4 (four) hours as needed for severe pain.   No facility-administered encounter medications on file as of 08/03/2017.     Allergy:  Allergies  Allergen Reactions  . Codeine Other (See Comments)    hallucinations Hyper/ Hallucinations   . Demerol [Meperidine] Other (See Comments)    "Knocks me out"  . Diazepam Other (See Comments)    Extreme sedation; patient does not recall taking diazepam (12/07/14)  . Tape Hives and Other (See Comments)    "thick clear plastic tape" causes blisters  . Macrobid [Nitrofurantoin]  Rash  . Morphine And Related Itching and Rash    Per CRNA, pt tol Fentanyl IV for OR case, no issues reported to rec RN in PACU, no immediate PACU issues noted as well upon arrival  . Penicillins Hives and Swelling  . Valisone [Betamethasone] Rash  Social Hx:   Social History   Socioeconomic History  . Marital status: Married    Spouse name: Not on file  . Number of children: Not on file  . Years of education: Not on file  . Highest education level: Not on file  Social Needs  . Financial resource strain: Not on file  . Food insecurity - worry: Not on file  . Food insecurity - inability: Not on file  . Transportation needs - medical: Not on file  . Transportation needs - non-medical: Not on file  Occupational History  . Not on file  Tobacco Use  . Smoking status: Former Smoker    Packs/day: 2.00    Years: 41.00    Pack years: 82.00    Types: Cigarettes    Last attempt to quit: 06/05/1999    Years since quitting: 18.1  . Smokeless tobacco: Never Used  Substance and Sexual Activity  . Alcohol use: No  . Drug use: No  . Sexual activity: Not on file  Other Topics Concern  . Not on file  Social History Narrative  . Not on file    Past Surgical Hx:  Past Surgical History:  Procedure Laterality Date  . BLADDER SUSPENSION  10/13/2011   Procedure: TRANSVAGINAL TAPE (TVT) PROCEDURE;  Surgeon: Delice Lesch, MD;  Location: [facility];  Service: Gynecology;  Laterality: N/A;  . BREAST BIOPSY Left 03/19/2004  . BREAST LUMPECTOMY Left 03-27-2004  . CARDIAC CATHETERIZATION  10-30-2000  dr Wynonia Lawman   normal coronary arteries and LVF/  ef 65-70%  . CLOSED MANIPULATION  W/ OPEN REDUCTION EXCHANGE TIBIAL FEMORAL BEARING POST TOTAL LEFT KNEE  04-14-2001  . CYSTOSCOPY  10/13/2011   Procedure: CYSTOSCOPY;  Surgeon: Delice Lesch, MD;  Location: [facility];  Service: Gynecology;  Laterality: Bilateral;  . DILATION AND CURETTAGE OF UTERUS  1968  . FOOT SURGERY Left 02-01-2009   TRIPLE  ARTHRODESIS  . FOOT SURGERY Left 11/14   I&D left foot with woundvac placement  . INCISIONAL HERNIA REPAIR  05/05/2011   Procedure: LAPAROSCOPIC INCISIONAL HERNIA;  Surgeon: Edward Jolly, MD;  Location: [facility];  Service: General;  Laterality: N/A;  LAPAROSCOPIC REPAIR INCARCERATED INCISIONAL HERNIA  WITH MESH  . KNEE ARTHROSCOPY Bilateral left [id number] & 04-1992/   right [phone]  . LAPAROSCOPIC CHOLECYSTECTOMY  06-21-2010  . LEFT HEART CATH AND CORONARY ANGIOGRAPHY N/A 04/13/2017   Procedure: LEFT HEART CATH AND CORONARY ANGIOGRAPHY;  Surgeon: Jettie Booze, MD;  Location: [facility];  Service: Cardiovascular;  Laterality: N/A;  . LUMBAR SPINE SURGERY  1998   L5 -- S1  . MASTECTOMY Right 1997   W/  LYMPH NODE DISSECTION AND RECONSTRUCTION WITH IMPLANTS AND EXPANDER  . NEGATIVE SLEEP STUDY  12/26/2015  . PORT-A-CATH PLACEMENT AND REMOVAL  1997  . REMOVAL RIGHT BREAST IMPLANT AND CAPSULECTOMY  06-03-1999  . REVISION TOTAL KNEE ARTHROPLASTY Left 07-07-2005  &  12-10-2001   12-10-2001  REMOVAL TOTAL KNEE/ I&D / INSERTION ANTIBIOTIC SPACER  . REVISION TOTAL KNEE ARTHROPLASTY Right 07-07-2005  . ROTATOR CUFF REPAIR Right   . TENDON REPAIR RIGHT RING FINGER  2011  . THUMB TRIGGER RELEASE Right 1993  . THYROIDECTOMY, PARTIAL  1983  . TONSILLECTOMY  1968  . TOTAL KNEE ARTHROPLASTY Bilateral right 05-04-2000/  left 03-29-2001  . VULVECTOMY  05/27/2012   Procedure: WIDE EXCISION VULVECTOMY;  Surgeon: Imagene Gurney A. Alycia Rossetti, MD;  Location: [facility];  Service: Gynecology;  Laterality: N/A;  Wide Local  Excision of Vulva   . VULVECTOMY N/A 11/23/2012   Procedure: WIDE LOCAL EXCISION OF THE VULVA;  Surgeon: Imagene Gurney A. Alycia Rossetti, MD;  Location: [facility];  Service: Gynecology;  Laterality: N/A;  left inferior vulvar biopsy excision left superior lesion left inferior vulvar excision  . VULVECTOMY N/A 08/02/2013   Procedure: WIDE LOCAL  EXCISION VULVA;  Surgeon: Imagene Gurney A. Alycia Rossetti, MD;  Location: [facility];   Service: Gynecology;  Laterality: N/A;  . VULVECTOMY Left 03/29/2014   Procedure: WIDE LOCAL EXCISION OF LEFT VULVA ;  Surgeon: Imagene Gurney A. Alycia Rossetti, MD;  Location: [facility];  Service: Gynecology;  Laterality: Left;  Marland Kitchen VULVECTOMY Bilateral 01/24/2015   Procedure: WIDE LOCAL EXCISION VULVA;  Surgeon: [name], MD;  Location: [place];  Service: Gynecology;  Laterality: Bilateral;  . VULVECTOMY PARTIAL  07-16-2010    Past Medical Hx:  Past Medical History:  Diagnosis Date  . Anxiety   . Arthritis   . Asthma   . Chronic constipation   . Depression   . Fibromyalgia   . GERD (gastroesophageal reflux disease)   . 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 History of colon polyps    2007 hyperplagia  . History of esophageal dilatation    2008  . History of peptic ulcer   . History of small bowel obstruction    2012  . Hypothyroidism   . Macular degeneration of right eye   . Numbness of left foot    4 toes  . Paget's disease of vulva   . Personal history of chemotherapy 1997  . Personal history of radiation therapy 2005  . PONV (postoperative nausea and vomiting)    and HARD TO WAKE  . Seasonal allergies   . SUI (stress urinary incontinence, female)   . Wears glasses     Family Hx:  Family History  Problem Relation Age of Onset  . Cancer Father 14       lung and kidney   . Alzheimer's disease Mother   . Heart attack Maternal Grandmother   . Alcohol abuse Brother   . Breast cancer Paternal Aunt   . Breast cancer Cousin   . Breast cancer Cousin     Vitals:  Blood pressure 125/65, pulse 82, temperature 98.4 F (36.9 C), temperature source Oral, resp. rate 18, height 5\' 5"  (1.651 m), weight 238 lb 11.2 oz (108.3 kg), SpO2 100 %.  Physical Exam:  General: Well developed, well nourished female in no  acute distress. Alert and oriented x 3.  Genitourinary:    Vulva: Significantly distorted by prior partial vulvectomy's and plastic surgery reconstruction.  No gross lesions are noted.  Slightly erythematous.  Skin abrasion on left lateral vulva from friction due to undergarment. Left upper inner thigh incision opened superficially at the distal portion of the incision 2 cm.  No purulent drainage noted.  Tender to touch, surrounding erythema with increased warmth.  Extremities: Mild LE edema, non-pitting. No bilateral cyanosis or clubbing.   Assessment/Plan: 72 year old female with long history of surgical procedures for Paget's of the vulva with most recent surgery for left thigh lesion on 07/14/17 with benign pathology. Plan to treat patient for incisional cellulitis with clindamycin three times daily for seven days.  Refill given on oxycodone and advised to use Tylenol  or ibuprofen first and to not take and drive.  She is to call with an update on her symptoms.  She is also advised to call for any questions or concerns.  Care of her incision and vulva discussed.  She is to follow up as scheduled with Dr. Fermin Schwab at the end of March or sooner if needed.       Dorothyann Gibbs, NP 08/06/2017, 10:13 AM

## 2017-08-13 ENCOUNTER — Telehealth (INDEPENDENT_AMBULATORY_CARE_PROVIDER_SITE_OTHER): Payer: Self-pay | Admitting: Radiology

## 2017-08-13 NOTE — Telephone Encounter (Signed)
Patient requests to be put on cancellation list or to worked in if possible with Dr. Marlou Sa on Wed. 08/19/17 in the morning due to her husband being seen by Dr. Lorin Mercy at that date/time. However patient did accept appointment Wed. 08/26/17 for bilateral shoulder pain.  She prefers to seen only Dr. Marlou Sa, had fall on 08/08/17, seen by PCP who suggested to see orthopedic physician. I informed her she may not be able to be worked in sooner due to Dr. Forbes Cellar schedule.  Call back # [phone]

## 2017-08-13 NOTE — Telephone Encounter (Signed)
IC s/w patient and sooner appt scheduled.

## 2017-08-17 ENCOUNTER — Inpatient Hospital Stay (HOSPITAL_BASED_OUTPATIENT_CLINIC_OR_DEPARTMENT_OTHER): Payer: Medicare HMO | Admitting: Gynecology

## 2017-08-17 ENCOUNTER — Encounter: Payer: Self-pay | Admitting: Gynecology

## 2017-08-17 VITALS — BP 125/69 | HR 72 | Temp 98.2°F | Resp 20 | Wt 240.0 lb

## 2017-08-17 DIAGNOSIS — C4499 Other specified malignant neoplasm of skin, unspecified: Secondary | ICD-10-CM

## 2017-08-17 DIAGNOSIS — C519 Malignant neoplasm of vulva, unspecified: Secondary | ICD-10-CM

## 2017-08-17 NOTE — Patient Instructions (Signed)
Follow up with Dr. Fermin Schwab in 1 month.

## 2017-08-17 NOTE — Progress Notes (Signed)
Consult Note: Gyn-Onc   [name] 72 y.o. female  No chief complaint on file.   Assessment : Long-standing history of Paget's disease of the vulva.  Status post recent excision of lesion on left thigh which turned out to be chronic granulation tissue.  Plan: The patient will continue her current regimen using triamcinolone ointment for her vulvar pruritus.  She will continue to use Neosporin on the granulating area on her thigh.  Return in approximately 1 month.  Interval History:  Patient returns today as previously scheduled.  She underwent excision of a lesion on her thigh at [facility].  Postoperatively she developed cellulitis which was treated with Cleocin and now topical Neosporin.  Currently she denies any fever chills or significant drainage.  She has been using a Band-Aid over the opened area.  HPI:  Patient is a 72 year old with a history of a partial simple vulvectomy in February of 2012 for vulvar Paget's disease. Some of the surgical margins were positive. Her postoperative course was uncomplicated. She was last seen by our service in 12/14. At that time there was no evidence of any lesions consistent with Paget's. However, there is considerable amount of excoriations throughout the entire vulvar and medial thighs consistent with chronic vulvitis.In December 2013 she had a hernia repair due to bowel obstruction by Dr. Excell Seltzer with permanent mesh. In May 2013 she also had a TVT bladder by Dr. Everett Graff. She does continue to wear a pad and also takes Detrol twice daily but states that her urinary leakage is markedly improved. She described the vulvar irritation is intermittent itching with burning and that it has increased over time as has the pruritus. She does use a steroid cream twice daily and does scratch her vulva.   She underwent a wide local excision of the left vulva on January 2 of year 2014. Operative findings included 2 separate 3 x 3 and 1.2 cm lesions consistent  with Paget's on the left.  Pathology revealed 2 foci of extramammary Paget's disease extending to the left inked margin in the right and anterior tip margins at multiple foci. The posterior margin was negative for malignancy.   I performed a biopsy of this it was consistent with recurrent Paget's disease. On 11/23/2012 showed a repeat wide local excision of vulva x2 with the posterior fourchette biopsy. Findings revealed along the left labia minora/vagina is a Paget's disease measuring approximately 2.5 x 1 cm in the superior aspect of her prior left vulvar excision. The inferior aspect of a similar lesion. 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 On the left perineal body the area is somewhat excoriated pale. Biopsy was performed in this area.  Pathology revealed:  1. Vulva, biopsy, left inferior - EXTRAMAMMARY PAGET DISEASE EXTENDING TO THE EDGES OF THE BIOPSY. 2. Vulva, excision, left superior lesion suture marks 1200 - EXTRAMAMMARY PAGET DISEASE. - MARGINS NOT INVOLVED. - CLOSEST MARGIN 9 O'CLOCK, LESS THAN 0.1 CM. 3. Vulva, excision, left inferior excision suture marks 1200 - EXTRAMAMMARY PAGET DISEASE, 12 O'CLOCK MARGIN FOCALLY INVOLVED. - REMAINING MARGINS CLEAR.  We have discussed repeating surgery however she had multiple orthopedic issues had broken ankle. She was seen by [name] in December 2014. Exam at that time was fairly unremarkable.  I had seen her in January of 2015. At that time exam was concerning for Paget's.   On 08/02/2013 she underwent wide local excisions of the vulva x2.  Operative findings included pale pink raised  lesion measuring 1 x 0.5 cm the left upper vulva near the prior excision site. There is a pale raised lesion measuring 1.5 x 1 cm on the left crossing midline perineal body.  Pathology revealed left superior vulvar excision with extramammary Paget's disease extending to the 9:00 margin. The other margins were  negative for tumor. Of the left of midline lesion she extramammary Paget's disease extending to the 12:00 margin and focally at the 6:00 margin but otherwise negative margins. The patient at that time discussed with [place] that she was having some right lower quadrant pain. Her exam is very difficult secondary to habitus. There ultrasound was obtained. Ultrasound revealed:  FINDINGS:  Uterus Measurements: 4.6 x 3.5 x 3.8 cm. 2.5 x 1.7 x 1.6 cm uterine fibroid. This is present in the right lateral aspect of the body uterus.  Endometrium Thickness: 6.1 mm. No focal abnormality visualized.  Right ovary: No focal abnormality.  Left ovary :No focal abnormality.  Other findings: No free fluid  IMPRESSION:  Uterine fibroid otherwise negative exam.  I saw her March 16, 2014 at which time there was an area on the left vulva with hyperkeratosis. A biopsy of that area was performed consistent with Paget's disease. On March 29, 2014 she underwent wide local excision of the vulva. On exam she had a 1 cm left vulvar lesion just below the left labia minora. She underwent a wide local excision. He received a 3.7 x 1.7 x 0.3 cm lesion. There was extramammary Paget's disease extending to the 6 to 9:00 margin in the 12:00 margin. This is despite grossly negative margins on physical examination.   She had an excision in November that revealed Diagnosis Vulva, excision, w/stitch @ 1200 - EXTRAMAMMARY PAGET'S DISEASE, EXTENDING TO THE 6-9 O'CLOCK MARGIN AND 12 O'CLOCK PERPENDICULAR MARGIN.  I saw her in February 2016 at which time there was concern for recurrent disease. She decided to proceed with Aldara therapy. She used for approximate 4 weeks and then decided to stop it secondary to the expense of the medication. She then started using the clobetasol. While she was using the Aldara all the pruritus in her vulva resolved itself. Since she stopped it and was using the steroid cream the pruritus returned.  I saw  her in May 2016 at which time she has small area consistent with Paget's on the right labia minora and a larger patch on the left. Biopsy on the right revealed extramammary Paget's. After discussion she wished to try the Aldara again. She called and requested an earlier appointment as for the last 2-3 weeks had increased itching and has been scratching so much that she's had some bleeding. I last saw her on July 6. At that time she had been having a lot of symptoms of pruritus and scratching a great deal. Vulvar examination at that time revealed a fairly macerated vulva and it was possible to discern what could be related to Paget's, effect of the Aldara therapy, trauma from scratching. She was asked to stop the Aldara therapy and use a low-dose daily cream. We asked her to refrain from scratching come in today for evaluation.  I saw her again December 26, 2104 at which time there was evidence clearly a Paget's disease and she scheduled for surgery. On January 24, 2015 she underwent bilateral wide local excisions of the vulva. On the right vulva there was a 2 cm patch of Paget's disease in the mid labia majora. On the left side. The remaining labia  minora in the area prior to the excision are smaller lesion consistent with Paget's disease.  Diagnosis 1. Vulva, excision, left wide excision - EXTRAMAMMARY PAGET'S DISEASE EXTENDING TO THE 12 O'CLOCK, 3 O'CLOCK, 6 O'CLOCK AND 9 O'CLOCK MARGINS. 2. Vulva, excision, right wide excision - EXTRAMAMMARY PAGET'S EXTENDING TO THE 3 O'CLOCK MARGIN AND THE 6 O'CLOCK POLE.  Please refer to the photograph in the operative note from Dr. Skeet Latch for complete mapping of her lesion. She still having a little bit of spotting from the biopsy sites and some pain.  The patient initially underwent partial simple vulvectomy in February 2012 for vulvar Paget's disease. Some of her surgical margins were positive. She had an uncomplicated post operative course. She had some  evidence of vulvar irritation afterward with symptoms of pruritus and burning that increased over time and for which she was using a steroid cream twice daily. She underwent wide local excision of the left vulva in January 2014. Findings at the time of surgery were to areas consistent with Paget's on the left, one measuring 3 x 3 cm and one measuring 1.2 cm pathology revealed 2 foci of extramammary Paget's disease extending to the left intact margin in the right and anterior tip margins at multiple foci. The patient was seen for follow-up with recurrent Paget's disease noted. In July 2014, she underwent repeat wide local excision of 2 areas in the posterior fourchette biopsy. Findings were an area along the left labium minora measuring 2.5 x 1 cm at the superior aspect of her prior left vulvar excision. There was a similar lesion at the inferior aspect. It appeared that the patient also had lichen sclerosis of the right vulva. Left perineal body biopsy was performed secondary to some noted excoriation. Pathology from the 2 excisions showed extramammary Paget's disease, margins on left superior lesion were negative, margin at 12:00 on inferior lesion focally involved. Perineal body biopsy positive for Paget's disease.   The patient then underwent several orthopedic surgeries and ultimately was taken back to the operating room in March 2015 for 2 wide local excisions of the vulva. Findings at that time included a pill raised pink lesion measuring 1 x 0.5 cm of the left upper vulva near the prior excision site and a second pill raised lesion measuring 1.5 x 1 cm on the left crossing midline perineal body. Pathology showed that the left superior vulvar excision was Paget's disease extending to the 9:00 margin. Other margins were negative. The left midline lesion also was Paget's disease extending to the 12:00 margin and focally to the 6:00 margin. Patient was then seen back in October 2015 at which time there was an  area on the left vulva with hyperkeratosis. A biopsy of that area confirmed Paget's disease. Patient underwent repeat wide local excision of the vulva on March 29, 2014. Findings were a 1 cm left vulvar lesion just below the left labium minora. Pathology showed Paget's disease extending to the 6, 9, and 12:00 margins. This was despite grossly negative margins on physical exam.  Patient was next seen in February 2016 at which time there was concern for recurrent disease. She decided to proceed with Aldara therapy which she used for 4 weeks and then stopped it secondary to the expense of the medication. Started using clobetasol. Her pruritus stopped with Aldara, but unfortunately returned when she switched to clobetasol. In May 2016 she was seen and had an area that was smaller still consistent with Paget's disease on the right labia minora  and a larger patch on the left. Biopsy on the right revealed extramammary Paget's. She opted to try Aldara again. Given evidence of recurrent disease she underwent bilateral wide local excision of the vulva at the end of August 2016. Both wide local excision specimens were positive for Paget's disease with extension to focal margins on each.  Most recently, the patient was taken to the operating room on 01/31/2016 for preoperative mapping of her lesion for her planned wide local excision of the left vulva and smaller excision on the right. Operative findings at that time were notable for surgically absent inferior aspect of the left labium minora and majora. Multiple areas with white flaky skin changes were biopsied and the abnormal-appearing tissue. Changes noted in the area of the clitoris and perineum and at the introitus just distal to the hymen. Biopsies were collected on all of the sites.   10/17 Findings: 1. Surgical absent left labia majorum inferiorly from prior vulvectomies.  2. Focusing on the left vulva, there was gross disease incorporating the prior surgical  site, characterized by patchy white and erythematous epithelium. The affected area spanned from the perineal body to 5-6cm laterally. Prior mapping biopsy sites x3 were well healed and visible and surrounded by what appeared to be grossly normal appearing skin. Superiorly on the left labia majorum, there was affected epithelium with a similar appearing patchy white, erythematous epithelium.  3. Focusing of the right vulva, there was less extensive disease. Affected area extended from the perineal body to roughly 3cm laterally with minimal involvement of the right labia majorum.  4. The urethra, anus, and clitoris were spared.  5. A rectal exam was performed during the procedure to confirm protection of the sphincter, which was intact at the conclusion of our case.  6. A rectocele was notable 3cm into the vagina.   Pathology: Final Diagnosis  A: Vulva, right and left, vulvectomy - (Recurrent/persisent) Paget disease  - Disease size approximately 8.3 x 4.6 cm  - Disease confined to the epidermis - Multiple epidermal margins are positive for disease: 6 o'clock to 11 o'clock (per original orientation)  - Deep margin free of involvement  In February 2019 the patient underwent wide local excision of a chronic open wound on her left thigh.  Final pathology showed this to be chronic granulation tissue.    Review of Systems:10 point review of systems is negative except as noted in interval history.   Vitals: There were no vitals taken for this visit.  Physical Exam: General : The patient is a healthy woman in no acute distress.  HEENT: normocephalic, extraoccular movements normal; neck is supple without thyromegally  Lynphnodes: Supraclavicular and inguinal nodes not enlarged  Abdomen: Soft, non-tender, no ascites, no organomegally, no masses, no hernias  Pelvic:  EGBUS: Significantly distorted by prior partial vulvectomy's and plastic surgery reconstruction.  No gross lesions are noted.  The  area where she has pruritus is intact but slightly erythematous.  This does not appear to have a classic Paget's disease picture at the present time.   At the end of the incision used to create the flap on the left side is a 2 cm area that is granulating.  There is no evidence of cellulitis.  Some absorbable sutures removed without difficulty. Vagina: Normal, no lesions  Urethra and Bladder: Normal, non-tender   Lower extremities: No edema or varicosities. Normal range of motion      Allergies  Allergen Reactions  . Codeine Other (See Comments)  hallucinations Hyper/ Hallucinations   . Demerol [Meperidine] Other (See Comments)    "Knocks me out"  . Diazepam Other (See Comments)    Extreme sedation; patient does not recall taking diazepam (12/07/14)  . Tape Hives and Other (See Comments)    "thick clear plastic tape" causes blisters  . Macrobid [Nitrofurantoin] Rash  . Morphine And Related Itching and Rash    Per CRNA, pt tol Fentanyl IV for OR case, no issues reported to rec RN in PACU, no immediate PACU issues noted as well upon arrival  . Penicillins Hives and Swelling  . Valisone [Betamethasone] Rash    Past Medical History:  Diagnosis Date  . Anxiety   . Arthritis   . Asthma   . Chronic constipation   . Depression   . Fibromyalgia   . GERD (gastroesophageal reflux disease)   . 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 History of colon polyps    2007 hyperplagia  . History of esophageal dilatation    2008  . History of peptic ulcer   . History of small bowel obstruction    2012  . Hypothyroidism   . Macular degeneration of right eye   . Numbness of left foot    4 toes  . Paget's disease of vulva   . Personal history of chemotherapy 1997  . Personal history of radiation therapy 2005  . PONV (postoperative nausea and  vomiting)    and HARD TO WAKE  . Seasonal allergies   . SUI (stress urinary incontinence, female)   . Wears glasses     Past Surgical History:  Procedure Laterality Date  . BLADDER SUSPENSION  10/13/2011   Procedure: TRANSVAGINAL TAPE (TVT) PROCEDURE;  Surgeon: Delice Lesch, MD;  Location: [facility];  Service: Gynecology;  Laterality: N/A;  . BREAST BIOPSY Left 03/19/2004  . BREAST LUMPECTOMY Left 03-27-2004  . CARDIAC CATHETERIZATION  10-30-2000  dr Wynonia Lawman   normal coronary arteries and LVF/  ef 65-70%  . CLOSED MANIPULATION  W/ OPEN REDUCTION EXCHANGE TIBIAL FEMORAL BEARING POST TOTAL LEFT KNEE  04-14-2001  . CYSTOSCOPY  10/13/2011   Procedure: CYSTOSCOPY;  Surgeon: Delice Lesch, MD;  Location: [facility];  Service: Gynecology;  Laterality: Bilateral;  . DILATION AND CURETTAGE OF UTERUS  1968  . FOOT SURGERY Left 02-01-2009   TRIPLE ARTHRODESIS  . FOOT SURGERY Left 11/14   I&D left foot with woundvac placement  . INCISIONAL HERNIA REPAIR  05/05/2011   Procedure: LAPAROSCOPIC INCISIONAL HERNIA;  Surgeon: Edward Jolly, MD;  Location: [facility];  Service: General;  Laterality: N/A;  LAPAROSCOPIC REPAIR INCARCERATED INCISIONAL HERNIA  WITH MESH  . KNEE ARTHROSCOPY Bilateral left [phone] & 04-1992/   right [phone]  . LAPAROSCOPIC CHOLECYSTECTOMY  06-21-2010  . LEFT HEART CATH AND CORONARY ANGIOGRAPHY N/A 04/13/2017   Procedure: LEFT HEART CATH AND CORONARY ANGIOGRAPHY;  Surgeon: Jettie Booze, MD;  Location: [facility];  Service: Cardiovascular;  Laterality: N/A;  . LUMBAR SPINE SURGERY  1998   L5 -- S1  . MASTECTOMY Right 1997   W/  LYMPH NODE DISSECTION AND RECONSTRUCTION WITH IMPLANTS AND EXPANDER  . NEGATIVE SLEEP STUDY  12/26/2015  . PORT-A-CATH PLACEMENT AND REMOVAL  1997  . REMOVAL RIGHT BREAST IMPLANT AND CAPSULECTOMY  06-03-1999  . REVISION TOTAL KNEE  ARTHROPLASTY Left 07-07-2005  &  12-10-2001   12-10-2001  REMOVAL TOTAL KNEE/ I&D / INSERTION ANTIBIOTIC  SPACER  . REVISION TOTAL KNEE ARTHROPLASTY Right 07-07-2005  . ROTATOR CUFF REPAIR Right   . TENDON REPAIR RIGHT RING FINGER  2011  . THUMB TRIGGER RELEASE Right 1993  . THYROIDECTOMY, PARTIAL  1983  . TONSILLECTOMY  1968  . TOTAL KNEE ARTHROPLASTY Bilateral right 05-04-2000/  left 03-29-2001  . VULVECTOMY  05/27/2012   Procedure: WIDE EXCISION VULVECTOMY;  Surgeon: Imagene Gurney A. Alycia Rossetti, MD;  Location: [facility];  Service: Gynecology;  Laterality: N/A;  Wide Local Excision of Vulva   . VULVECTOMY N/A 11/23/2012   Procedure: WIDE LOCAL EXCISION OF THE VULVA;  Surgeon: Imagene Gurney A. Alycia Rossetti, MD;  Location: [facility];  Service: Gynecology;  Laterality: N/A;  left inferior vulvar biopsy excision left superior lesion left inferior vulvar excision  . VULVECTOMY N/A 08/02/2013   Procedure: WIDE LOCAL  EXCISION VULVA;  Surgeon: Imagene Gurney A. Alycia Rossetti, MD;  Location: [facility];  Service: Gynecology;  Laterality: N/A;  . VULVECTOMY Left 03/29/2014   Procedure: WIDE LOCAL EXCISION OF LEFT VULVA ;  Surgeon: Imagene Gurney A. Alycia Rossetti, MD;  Location: [facility];  Service: Gynecology;  Laterality: Left;  Marland Kitchen VULVECTOMY Bilateral 01/24/2015   Procedure: WIDE LOCAL EXCISION VULVA;  Surgeon: [name], MD;  Location: [facility];  Service: Gynecology;  Laterality: Bilateral;  . VULVECTOMY PARTIAL  07-16-2010    Current Outpatient Medications  Medication Sig Dispense Refill  . albuterol (PROVENTIL HFA;VENTOLIN HFA) 108 (90 BASE) MCG/ACT inhaler Inhale 2 puffs into the lungs every 4 (four) hours as needed for wheezing or shortness of breath. Reported on 08/01/2015    . aspirin EC 81 MG EC tablet Take 1 tablet (81 mg total) daily by mouth. 30 tablet 0  . atorvastatin (LIPITOR) 40 MG tablet Take 1 tablet (40 mg total) daily at 6 PM by mouth. 30 tablet 0  . baclofen (LIORESAL) 10 MG tablet Take 1 tablet (10 mg total) by mouth 2 (two) times daily. 60 each 0  . baclofen (LIORESAL) 10 MG tablet Take 1 tablet (10 mg total) by  mouth 2 (two) times daily. 180 each 0  . buPROPion (WELLBUTRIN XL) 150 MG 24 hr tablet Take 150 mg by mouth every morning.     . Calcium Carbonate-Vitamin D 600-125 MG-UNIT TABS Take 1 tablet by mouth daily.     . Cholecalciferol (VITAMIN D3) 1000 UNITS CAPS Take 2 capsules by mouth daily.    . citalopram (CELEXA) 40 MG tablet Take 40 mg by mouth every morning.     . clindamycin (CLEOCIN) 300 MG capsule Take 1 capsule (300 mg total) by mouth 3 (three) times daily. 21 capsule 0  . furosemide (LASIX) 20 MG tablet Take 20 mg daily by mouth.    . ibandronate (BONIVA) 150 MG tablet Take 150 mg every 30 (thirty) days by mouth. Take in the morning with a full glass of water, on an empty stomach, and do not take anything else by mouth or lie down for the next 30 min.    Marland Kitchen levothyroxine (SYNTHROID) 175 MCG tablet TAKE 1 TABLET BY MOUTH EVERY DAY    . metoprolol tartrate (LOPRESSOR) 25 MG tablet Take 0.5 tablets (12.5 mg total) 2 (two) times daily by mouth. 60 tablet 0  . montelukast (SINGULAIR) 10 MG tablet Take 10 mg daily by mouth.     . Multiple Vitamin (MULTIVITAMIN WITH MINERALS) TABS tablet Take 1  tablet daily by mouth.    . nystatin cream (MYCOSTATIN) Apply 1 application topically 2 (two) times daily. 90 g 3  . Omega-3 1000 MG CAPS Take 2 g daily by mouth.     . oxyCODONE (OXY IR/ROXICODONE) 5 MG immediate release tablet Take 1 tablet (5 mg total) by mouth every 4 (four) hours as needed for severe pain. 15 tablet 0  . pantoprazole (PROTONIX) 40 MG tablet Take 40 mg by mouth every morning.     . polyvinyl alcohol (LIQUIFILM TEARS) 1.4 % ophthalmic solution Place 1 drop into both eyes as needed for dry eyes.     No current facility-administered medications for this visit.     Social History   Socioeconomic History  . Marital status: Married    Spouse name: Not on file  . Number of children: Not on file  . Years of education: Not on file  . Highest education level: Not on file  Occupational  History  . Not on file  Social Needs  . Financial resource strain: Not on file  . Food insecurity:    Worry: Not on file    Inability: Not on file  . Transportation needs:    Medical: Not on file    Non-medical: Not on file  Tobacco Use  . Smoking status: Former Smoker    Packs/day: 2.00    Years: 41.00    Pack years: 82.00    Types: Cigarettes    Last attempt to quit: 06/05/1999    Years since quitting: 18.2  . Smokeless tobacco: Never Used  Substance and Sexual Activity  . Alcohol use: No  . Drug use: No  . Sexual activity: Not on file  Lifestyle  . Physical activity:    Days per week: Not on file    Minutes per session: Not on file  . Stress: Not on file  Relationships  . Social connections:    Talks on phone: Not on file    Gets together: Not on file    Attends religious service: Not on file    Active member of club or organization: Not on file    Attends meetings of clubs or organizations: Not on file    Relationship status: Not on file  . Intimate partner violence:    Fear of current or ex partner: Not on file    Emotionally abused: Not on file    Physically abused: Not on file    Forced sexual activity: Not on file  Other Topics Concern  . Not on file  Social History Narrative  . Not on file    Family History  Problem Relation Age of Onset  . Cancer Father 45       lung and kidney   . Alzheimer's disease Mother   . Heart attack Maternal Grandmother   . Alcohol abuse Brother   . Breast cancer Paternal Aunt   . Breast cancer Cousin   . Breast cancer Cousin       Marti Sleigh, MD 08/17/2017, 1:23 PM

## 2017-08-19 ENCOUNTER — Ambulatory Visit (INDEPENDENT_AMBULATORY_CARE_PROVIDER_SITE_OTHER): Payer: Medicare HMO | Admitting: Orthopedic Surgery

## 2017-08-19 ENCOUNTER — Encounter (INDEPENDENT_AMBULATORY_CARE_PROVIDER_SITE_OTHER): Payer: Self-pay | Admitting: Orthopedic Surgery

## 2017-08-19 DIAGNOSIS — M19012 Primary osteoarthritis, left shoulder: Secondary | ICD-10-CM

## 2017-08-19 MED ORDER — LIDOCAINE HCL 1 % IJ SOLN
5.0000 mL | INTRAMUSCULAR | Status: AC | PRN
  Administered 2017-08-19: 5 mL

## 2017-08-19 MED ORDER — BUPIVACAINE HCL 0.5 % IJ SOLN
9.0000 mL | INTRAMUSCULAR | Status: AC | PRN
  Administered 2017-08-19: 9 mL via INTRA_ARTICULAR

## 2017-08-19 MED ORDER — METHYLPREDNISOLONE ACETATE 40 MG/ML IJ SUSP
40.0000 mg | INTRAMUSCULAR | Status: AC | PRN
  Administered 2017-08-19: 40 mg via INTRA_ARTICULAR

## 2017-08-19 NOTE — Progress Notes (Signed)
Office Visit Note   Patient: [name]           Date of Birth: [birth year]           MRN: [id number] Visit Date: 08/19/2017 Requested by: Chesley Noon, MD [place], [place] [id number] PCP: Chesley Noon, MD  Subjective: Chief Complaint  Patient presents with  . pain s/p fall    HPI: [name] is a 72 year old patient with bilateral shoulder pain right buttock pain.  She fell 08/06/2017 when she was trying to get a buggy at the grocery store.  She does ambulate with a cane.  She is having recurrence of soreness in her shoulders.  She has difficulty lifting items into a cabinet.  She fell on her right hand side.  Patient has a known history of right shoulder rotator cuff arthropathy and left shoulder osteoarthritis.              ROS: All systems reviewed are negative as they relate to the chief complaint within the history of present illness.  Patient denies  fevers or chills.   Assessment & Plan: Visit Diagnoses:  1. Arthritis of left shoulder region     Plan: Impression is bilateral shoulder pain with left shoulder arthritis.  She has right shoulder rotator cuff arthropathy.  Both shoulders have been aggravated from the fall.  We will try an injection into the glenohumeral joint of the left shoulder.  This was last done in December with good results.  She will continue taking muscle relaxers.  I will see her back as needed.  She has been ambulating with a cane so I do not think that there is any issues with her hip joint.  Follow-Up Instructions: Return if symptoms worsen or fail to improve.   Orders:  No orders of the defined types were placed in this encounter.  No orders of the defined types were placed in this encounter.     Procedures: Large Joint Inj: L glenohumeral on 08/19/2017 12:19 PM Indications: diagnostic evaluation and pain Details: 18 G 1.5 in needle, posterior approach  Arthrogram: No  Medications: 9 mL bupivacaine 0.5 %; 40 mg  methylPREDNISolone acetate 40 MG/ML; 5 mL lidocaine 1 % Outcome: tolerated well, no immediate complications Procedure, treatment alternatives, risks and benefits explained, specific risks discussed. Consent was given by the patient. Immediately prior to procedure a time out was called to verify the correct patient, procedure, equipment, support staff and site/side marked as required. Patient was prepped and draped in the usual sterile fashion.       Clinical Data: No additional findings.  Objective: Vital Signs: There were no vitals taken for this visit.  Physical Exam:   Constitutional: Patient appears well-developed HEENT:  Head: Normocephalic Eyes:EOM are normal Neck: Normal range of motion Cardiovascular: Normal rate Pulmonary/chest: Effort normal Neurologic: Patient is alert Skin: Skin is warm Psychiatric: Patient has normal mood and affect    Ortho Exam: Orthopedic exam demonstrates no groin pain with internal/external rotation of either leg.  She has good cervical spine range of motion which is unchanged from prior visits.  Right shoulder has passive motion to about 170 forward flexion and 95 degrees of abduction on the left the motion is restricted to about 100 of forward flexion and less than 90 degrees of isolated glenohumeral abduction.  Motor sensory function both hands intact.  Specialty Comments:  No specialty comments available.  Imaging: No results found.   PMFS History: Patient Active  Problem List   Diagnosis Date Noted  . Shortness of breath   . ACS (acute coronary syndrome) ([place]) 04/11/2017  . Chest pain, rule out acute myocardial infarction 04/10/2017  . Bigeminy 04/10/2017  . Thrombocytopenic disorder ([place]) 02/25/2017  . Osteoporosis 12/15/2016  . 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 Incomplete emptying of bladder 07/30/2016  . Left foot pain 12/11/2015  . 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  Stopped smoking with greater than 40 pack year history 10/23/2015  . 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 Mixed incontinence urge and stress 09/16/2011  . Cancer of right breast, stage 2 ([place]) 05/28/2011  . 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 Hypothyroid 05/28/2011  . GERD (gastroesophageal reflux disease) 05/28/2011  . Fibromyalgia 05/28/2011  . 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 Mild depression ([place]) 05/28/2011  . Thrombocytopenia, unspecified ([place]) 05/28/2011  . Morbid obesity ([place]) 04/10/2011  . GERD 11/09/2009  . ALLERGIC RHINITIS 08/23/2007  . ASTHMA 08/23/2007  . COUGH 08/23/2007  . SKIN CANCER, HX OF 08/23/2007   Past Medical History:  Diagnosis Date  . Anxiety   . Arthritis   . Asthma   . Chronic constipation   . Depression   . Fibromyalgia   . GERD (gastroesophageal reflux disease)   . 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 History of colon polyps    2007 hyperplagia  . History of esophageal dilatation    2008  . History of peptic ulcer   . History of small bowel obstruction    2012  . Hypothyroidism   . Macular degeneration of right eye   . Numbness of left foot    4 toes  . Paget's disease of vulva   . Personal history of chemotherapy 1997  . Personal history of radiation therapy 2005  . PONV (postoperative nausea and vomiting)    and HARD TO WAKE  . Seasonal allergies   . SUI (stress urinary incontinence, female)   . Wears glasses     Family History  Problem Relation Age of Onset  . Cancer Father 87       lung and kidney   . Alzheimer's disease Mother   . Heart attack Maternal Grandmother   . Alcohol abuse Brother   . Breast cancer Paternal Aunt   . Breast cancer Cousin   . Breast cancer Cousin     Past Surgical History:  Procedure Laterality  Date  . BLADDER SUSPENSION  10/13/2011   Procedure: TRANSVAGINAL TAPE (TVT) PROCEDURE;  Surgeon: Delice Lesch, MD;  Location: [facility];  Service: Gynecology;  Laterality: N/A;  . BREAST BIOPSY Left 03/19/2004  . BREAST LUMPECTOMY Left 03-27-2004  . CARDIAC CATHETERIZATION  10-30-2000  dr Wynonia Lawman   normal coronary arteries and LVF/  ef 65-70%  . CLOSED MANIPULATION  W/ OPEN REDUCTION EXCHANGE TIBIAL FEMORAL BEARING POST TOTAL LEFT KNEE  04-14-2001  . CYSTOSCOPY  10/13/2011   Procedure: CYSTOSCOPY;  Surgeon: Delice Lesch, MD;  Location: [facility];  Service: Gynecology;  Laterality: Bilateral;  . DILATION AND CURETTAGE OF UTERUS  1968  . FOOT SURGERY Left 02-01-2009   TRIPLE ARTHRODESIS  . FOOT SURGERY Left 11/14   I&D left foot with woundvac placement  . INCISIONAL HERNIA  REPAIR  05/05/2011   Procedure: LAPAROSCOPIC INCISIONAL HERNIA;  Surgeon: Edward Jolly, MD;  Location: [facility];  Service: General;  Laterality: N/A;  LAPAROSCOPIC REPAIR INCARCERATED INCISIONAL HERNIA  WITH MESH  . KNEE ARTHROSCOPY Bilateral left [phone] & 04-1992/   right [phone]  . LAPAROSCOPIC CHOLECYSTECTOMY  06-21-2010  . LEFT HEART CATH AND CORONARY ANGIOGRAPHY N/A 04/13/2017   Procedure: LEFT HEART CATH AND CORONARY ANGIOGRAPHY;  Surgeon: Jettie Booze, MD;  Location: [facility];  Service: Cardiovascular;  Laterality: N/A;  . LUMBAR SPINE SURGERY  1998   L5 -- S1  . MASTECTOMY Right 1997   W/  LYMPH NODE DISSECTION AND RECONSTRUCTION WITH IMPLANTS AND EXPANDER  . NEGATIVE SLEEP STUDY  12/26/2015  . PORT-A-CATH PLACEMENT AND REMOVAL  1997  . REMOVAL RIGHT BREAST IMPLANT AND CAPSULECTOMY  06-03-1999  . REVISION TOTAL KNEE ARTHROPLASTY Left 07-07-2005  &  12-10-2001   12-10-2001  REMOVAL TOTAL KNEE/ I&D / INSERTION ANTIBIOTIC SPACER  . REVISION TOTAL KNEE ARTHROPLASTY Right 07-07-2005  . ROTATOR CUFF REPAIR Right   . TENDON REPAIR RIGHT RING FINGER  2011  . THUMB TRIGGER RELEASE Right 1993  .  THYROIDECTOMY, PARTIAL  1983  . TONSILLECTOMY  1968  . TOTAL KNEE ARTHROPLASTY Bilateral right 05-04-2000/  left 03-29-2001  . VULVECTOMY  05/27/2012   Procedure: WIDE EXCISION VULVECTOMY;  Surgeon: Imagene Gurney A. Alycia Rossetti, MD;  Location: [facility];  Service: Gynecology;  Laterality: N/A;  Wide Local Excision of Vulva   . VULVECTOMY N/A 11/23/2012   Procedure: WIDE LOCAL EXCISION OF THE VULVA;  Surgeon: Imagene Gurney A. Alycia Rossetti, MD;  Location: [facility];  Service: Gynecology;  Laterality: N/A;  left inferior vulvar biopsy excision left superior lesion left inferior vulvar excision  . VULVECTOMY N/A 08/02/2013   Procedure: WIDE LOCAL  EXCISION VULVA;  Surgeon: Imagene Gurney A. Alycia Rossetti, MD;  Location: [facility];  Service: Gynecology;  Laterality: N/A;  . VULVECTOMY Left 03/29/2014   Procedure: WIDE LOCAL EXCISION OF LEFT VULVA ;  Surgeon: Imagene Gurney A. Alycia Rossetti, MD;  Location: [facility];  Service: Gynecology;  Laterality: Left;  Marland Kitchen VULVECTOMY Bilateral 01/24/2015   Procedure: WIDE LOCAL EXCISION VULVA;  Surgeon: [name], MD;  Location: [facility];  Service: Gynecology;  Laterality: Bilateral;  . VULVECTOMY PARTIAL  07-16-2010   Social History   Occupational History  . Not on file  Tobacco Use  . Smoking status: Former Smoker    Packs/day: 2.00    Years: 41.00    Pack years: 82.00    Types: Cigarettes    Last attempt to quit: 06/05/1999    Years since quitting: 18.2  . Smokeless tobacco: Never Used  Substance and Sexual Activity  . Alcohol use: No  . Drug use: No  . Sexual activity: Not on file

## 2017-08-26 ENCOUNTER — Ambulatory Visit (INDEPENDENT_AMBULATORY_CARE_PROVIDER_SITE_OTHER): Payer: Medicare Other | Admitting: Orthopedic Surgery

## 2017-09-14 ENCOUNTER — Other Ambulatory Visit: Payer: Self-pay | Admitting: General Surgery

## 2017-09-14 DIAGNOSIS — Z1231 Encounter for screening mammogram for malignant neoplasm of breast: Secondary | ICD-10-CM

## 2017-09-16 ENCOUNTER — Ambulatory Visit (INDEPENDENT_AMBULATORY_CARE_PROVIDER_SITE_OTHER): Payer: Medicare HMO | Admitting: Podiatry

## 2017-09-16 ENCOUNTER — Encounter: Payer: Self-pay | Admitting: Podiatry

## 2017-09-16 DIAGNOSIS — L84 Corns and callosities: Secondary | ICD-10-CM

## 2017-09-17 NOTE — Progress Notes (Signed)
Subjective:   Patient ID: [name], female   DOB: 72 y.o.   MRN: [id number]   HPI Patient presents with painful lesion plantar medial left foot acute keratotic   ROS      Objective:  Physical Exam  Chronic lesion secondary to foot structure that becomes painful and it impossible for her to cut     Assessment:  Lesion secondary to foot structure     Plan:  Debridement of lesion accomplished no iatrogenic bleeding and reappoint for routine care

## 2017-09-18 ENCOUNTER — Inpatient Hospital Stay: Payer: Medicare HMO | Attending: Gynecology | Admitting: Gynecology

## 2017-09-18 ENCOUNTER — Encounter: Payer: Self-pay | Admitting: Gynecology

## 2017-09-18 VITALS — HR 85 | Temp 98.0°F | Resp 20 | Ht 65.5 in | Wt 241.9 lb

## 2017-09-18 DIAGNOSIS — C519 Malignant neoplasm of vulva, unspecified: Secondary | ICD-10-CM | POA: Diagnosis present

## 2017-09-18 DIAGNOSIS — C4499 Other specified malignant neoplasm of skin, unspecified: Secondary | ICD-10-CM

## 2017-09-18 NOTE — Patient Instructions (Signed)
Plan to follow up in six months or sooner if needed.  Continue with neosporin for another week.  Please call for any needs.

## 2017-09-18 NOTE — Progress Notes (Signed)
Consult Note: Gyn-Onc   [name] 72 y.o. female  Chief Complaint  Patient presents with  . Paget's disease of vulva    Assessment : Long-standing history of Paget's disease of the vulva.  Status post recent excision of lesion on left thigh which turned out to be chronic granulation tissue.  The surgical site has now completely healed.  Examination remainder of the vulva shows no evidence of Paget's disease.  Plan: The patient will return to see me in 6 months.  Interval History:  Patient returns today as previously scheduled.  She underwent excision of a lesion on her thigh at [facility].  Postoperatively she developed cellulitis which was treated with Cleocin and now topical Neosporin.   She has been given local wound care.  HPI:  Patient is a 72 year old with a history of a partial simple vulvectomy in February of 2012 for vulvar Paget's disease. Some of the surgical margins were positive. Her postoperative course was uncomplicated. She was last seen by our service in 12/14. At that time there was no evidence of any lesions consistent with Paget's. However, there is considerable amount of excoriations throughout the entire vulvar and medial thighs consistent with chronic vulvitis.In December 2013 she had a hernia repair due to bowel obstruction by Dr. Excell Seltzer with permanent mesh. In May 2013 she also had a TVT bladder by Dr. Everett Graff. She does continue to wear a pad and also takes Detrol twice daily but states that her urinary leakage is markedly improved. She described the vulvar irritation is intermittent itching with burning and that it has increased over time as has the pruritus. She does use a steroid cream twice daily and does scratch her vulva.   She underwent a wide local excision of the left vulva on January 2 of year 2014. Operative findings included 2 separate 3 x 3 and 1.2 cm lesions consistent with Paget's on the left.  Pathology revealed 2 foci of extramammary Paget's  disease extending to the left inked margin in the right and anterior tip margins at multiple foci. The posterior margin was negative for malignancy.   I performed a biopsy of this it was consistent with recurrent Paget's disease. On 11/23/2012 showed a repeat wide local excision of vulva x2 with the posterior fourchette biopsy. Findings revealed along the left labia minora/vagina is a Paget's disease measuring approximately 2.5 x 1 cm in the superior aspect of her prior left vulvar excision. The inferior aspect of a similar lesion. 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 On the left perineal body the area is somewhat excoriated pale. Biopsy was performed in this area.  Pathology revealed:  1. Vulva, biopsy, left inferior - EXTRAMAMMARY PAGET DISEASE EXTENDING TO THE EDGES OF THE BIOPSY. 2. Vulva, excision, left superior lesion suture marks 1200 - EXTRAMAMMARY PAGET DISEASE. - MARGINS NOT INVOLVED. - CLOSEST MARGIN 9 O'CLOCK, LESS THAN 0.1 CM. 3. Vulva, excision, left inferior excision suture marks 1200 - EXTRAMAMMARY PAGET DISEASE, 12 O'CLOCK MARGIN FOCALLY INVOLVED. - REMAINING MARGINS CLEAR.  We have discussed repeating surgery however she had multiple orthopedic issues had broken ankle. She was seen by [name] in December 2014. Exam at that time was fairly unremarkable.  I had seen her in January of 2015. At that time exam was concerning for Paget's.   On 08/02/2013 she underwent wide local excisions of the vulva x2.  Operative findings included pale pink raised lesion measuring 1 x 0.5 cm the left  upper vulva near the prior excision site. There is a pale raised lesion measuring 1.5 x 1 cm on the left crossing midline perineal body.  Pathology revealed left superior vulvar excision with extramammary Paget's disease extending to the 9:00 margin. The other margins were negative for tumor. Of the left of midline lesion she extramammary Paget's disease  extending to the 12:00 margin and focally at the 6:00 margin but otherwise negative margins. The patient at that time discussed with [place] that she was having some right lower quadrant pain. Her exam is very difficult secondary to habitus. There ultrasound was obtained. Ultrasound revealed:  FINDINGS:  Uterus Measurements: 4.6 x 3.5 x 3.8 cm. 2.5 x 1.7 x 1.6 cm uterine fibroid. This is present in the right lateral aspect of the body uterus.  Endometrium Thickness: 6.1 mm. No focal abnormality visualized.  Right ovary: No focal abnormality.  Left ovary :No focal abnormality.  Other findings: No free fluid  IMPRESSION:  Uterine fibroid otherwise negative exam.  I saw her March 16, 2014 at which time there was an area on the left vulva with hyperkeratosis. A biopsy of that area was performed consistent with Paget's disease. On March 29, 2014 she underwent wide local excision of the vulva. On exam she had a 1 cm left vulvar lesion just below the left labia minora. She underwent a wide local excision. He received a 3.7 x 1.7 x 0.3 cm lesion. There was extramammary Paget's disease extending to the 6 to 9:00 margin in the 12:00 margin. This is despite grossly negative margins on physical examination.   She had an excision in November that revealed Diagnosis Vulva, excision, w/stitch @ 1200 - EXTRAMAMMARY PAGET'S DISEASE, EXTENDING TO THE 6-9 O'CLOCK MARGIN AND 12 O'CLOCK PERPENDICULAR MARGIN.  I saw her in February 2016 at which time there was concern for recurrent disease. She decided to proceed with Aldara therapy. She used for approximate 4 weeks and then decided to stop it secondary to the expense of the medication. She then started using the clobetasol. While she was using the Aldara all the pruritus in her vulva resolved itself. Since she stopped it and was using the steroid cream the pruritus returned.  I saw her in May 2016 at which time she has small area consistent with Paget's on the  right labia minora and a larger patch on the left. Biopsy on the right revealed extramammary Paget's. After discussion she wished to try the Aldara again. She called and requested an earlier appointment as for the last 2-3 weeks had increased itching and has been scratching so much that she's had some bleeding. I last saw her on July 6. At that time she had been having a lot of symptoms of pruritus and scratching a great deal. Vulvar examination at that time revealed a fairly macerated vulva and it was possible to discern what could be related to Paget's, effect of the Aldara therapy, trauma from scratching. She was asked to stop the Aldara therapy and use a low-dose daily cream. We asked her to refrain from scratching come in today for evaluation.  I saw her again December 26, 2104 at which time there was evidence clearly a Paget's disease and she scheduled for surgery. On January 24, 2015 she underwent bilateral wide local excisions of the vulva. On the right vulva there was a 2 cm patch of Paget's disease in the mid labia majora. On the left side. The remaining labia minora in the area prior to the excision  are smaller lesion consistent with Paget's disease.  Diagnosis 1. Vulva, excision, left wide excision - EXTRAMAMMARY PAGET'S DISEASE EXTENDING TO THE 12 O'CLOCK, 3 O'CLOCK, 6 O'CLOCK AND 9 O'CLOCK MARGINS. 2. Vulva, excision, right wide excision - EXTRAMAMMARY PAGET'S EXTENDING TO THE 3 O'CLOCK MARGIN AND THE 6 O'CLOCK POLE.  Please refer to the photograph in the operative note from Dr. Skeet Latch for complete mapping of her lesion. She still having a little bit of spotting from the biopsy sites and some pain.  The patient initially underwent partial simple vulvectomy in February 2012 for vulvar Paget's disease. Some of her surgical margins were positive. She had an uncomplicated post operative course. She had some evidence of vulvar irritation afterward with symptoms of pruritus and burning that  increased over time and for which she was using a steroid cream twice daily. She underwent wide local excision of the left vulva in January 2014. Findings at the time of surgery were to areas consistent with Paget's on the left, one measuring 3 x 3 cm and one measuring 1.2 cm pathology revealed 2 foci of extramammary Paget's disease extending to the left intact margin in the right and anterior tip margins at multiple foci. The patient was seen for follow-up with recurrent Paget's disease noted. In July 2014, she underwent repeat wide local excision of 2 areas in the posterior fourchette biopsy. Findings were an area along the left labium minora measuring 2.5 x 1 cm at the superior aspect of her prior left vulvar excision. There was a similar lesion at the inferior aspect. It appeared that the patient also had lichen sclerosis of the right vulva. Left perineal body biopsy was performed secondary to some noted excoriation. Pathology from the 2 excisions showed extramammary Paget's disease, margins on left superior lesion were negative, margin at 12:00 on inferior lesion focally involved. Perineal body biopsy positive for Paget's disease.   The patient then underwent several orthopedic surgeries and ultimately was taken back to the operating room in March 2015 for 2 wide local excisions of the vulva. Findings at that time included a pill raised pink lesion measuring 1 x 0.5 cm of the left upper vulva near the prior excision site and a second pill raised lesion measuring 1.5 x 1 cm on the left crossing midline perineal body. Pathology showed that the left superior vulvar excision was Paget's disease extending to the 9:00 margin. Other margins were negative. The left midline lesion also was Paget's disease extending to the 12:00 margin and focally to the 6:00 margin. Patient was then seen back in October 2015 at which time there was an area on the left vulva with hyperkeratosis. A biopsy of that area confirmed Paget's  disease. Patient underwent repeat wide local excision of the vulva on March 29, 2014. Findings were a 1 cm left vulvar lesion just below the left labium minora. Pathology showed Paget's disease extending to the 6, 9, and 12:00 margins. This was despite grossly negative margins on physical exam.  Patient was next seen in February 2016 at which time there was concern for recurrent disease. She decided to proceed with Aldara therapy which she used for 4 weeks and then stopped it secondary to the expense of the medication. Started using clobetasol. Her pruritus stopped with Aldara, but unfortunately returned when she switched to clobetasol. In May 2016 she was seen and had an area that was smaller still consistent with Paget's disease on the right labia minora and a larger patch on the left. Biopsy  on the right revealed extramammary Paget's. She opted to try Aldara again. Given evidence of recurrent disease she underwent bilateral wide local excision of the vulva at the end of August 2016. Both wide local excision specimens were positive for Paget's disease with extension to focal margins on each.  Most recently, the patient was taken to the operating room on 01/31/2016 for preoperative mapping of her lesion for her planned wide local excision of the left vulva and smaller excision on the right. Operative findings at that time were notable for surgically absent inferior aspect of the left labium minora and majora. Multiple areas with white flaky skin changes were biopsied and the abnormal-appearing tissue. Changes noted in the area of the clitoris and perineum and at the introitus just distal to the hymen. Biopsies were collected on all of the sites.   10/17 Findings: 1. Surgical absent left labia majorum inferiorly from prior vulvectomies.  2. Focusing on the left vulva, there was gross disease incorporating the prior surgical site, characterized by patchy white and erythematous epithelium. The affected area  spanned from the perineal body to 5-6cm laterally. Prior mapping biopsy sites x3 were well healed and visible and surrounded by what appeared to be grossly normal appearing skin. Superiorly on the left labia majorum, there was affected epithelium with a similar appearing patchy white, erythematous epithelium.  3. Focusing of the right vulva, there was less extensive disease. Affected area extended from the perineal body to roughly 3cm laterally with minimal involvement of the right labia majorum.  4. The urethra, anus, and clitoris were spared.  5. A rectal exam was performed during the procedure to confirm protection of the sphincter, which was intact at the conclusion of our case.  6. A rectocele was notable 3cm into the vagina.   Pathology: Final Diagnosis  A: Vulva, right and left, vulvectomy - (Recurrent/persisent) Paget disease  - Disease size approximately 8.3 x 4.6 cm  - Disease confined to the epidermis - Multiple epidermal margins are positive for disease: 6 o'clock to 11 o'clock (per original orientation)  - Deep margin free of involvement  In February 2019 the patient underwent wide local excision of a chronic open wound on her left thigh.  Final pathology showed this to be chronic granulation tissue.    Review of Systems:10 point review of systems is negative except as noted in interval history.   Vitals: Pulse 85, temperature 98 F (36.7 C), temperature source Oral, resp. rate 20, height 5' 5.5" (1.664 m), weight 241 lb 14.4 oz (109.7 kg), SpO2 95 %.  Physical Exam: General : The patient is a healthy woman in no acute distress.  HEENT: normocephalic, extraoccular movements normal; neck is supple without thyromegally  Lynphnodes: Supraclavicular and inguinal nodes not enlarged  Abdomen: Soft, non-tender, no ascites, no organomegally, no masses, no hernias  Pelvic:  EGBUS: Significantly distorted by prior partial vulvectomy's and plastic surgery reconstruction.  No gross  lesions are noted.  The area where she has pruritus is intact but slightly erythematous.  This does not appear to have a classic Paget's disease picture at the present time.   At the end of the incision used to create the flap on the left side is a 2 cm area that is granulating.  There is no evidence of cellulitis.  Some absorbable sutures removed without difficulty. Vagina: Normal, no lesions  Urethra and Bladder: Normal, non-tender   Lower extremities: No edema or varicosities. Normal range of motion  Allergies  Allergen Reactions  . Hydromorphone Hcl Itching and Rash    Per CRNA, pt tol Fentanyl IV for OR case, no issues reported to rec RN in PACU, no immediate PACU issues noted as well upon arrival  . Codeine Other (See Comments)    hallucinations Hyper/ Hallucinations   . Demerol [Meperidine] Other (See Comments)    "Knocks me out"  . Diazepam Other (See Comments)    Extreme sedation; patient does not recall taking diazepam (12/07/14)  . Tape Hives and Other (See Comments)    "thick clear plastic tape" causes blisters  . Macrobid [Nitrofurantoin] Rash  . Morphine And Related Itching and Rash    Per CRNA, pt tol Fentanyl IV for OR case, no issues reported to rec RN in PACU, no immediate PACU issues noted as well upon arrival  . Penicillins Hives and Swelling  . Valisone [Betamethasone] Rash    Past Medical History:  Diagnosis Date  . Anxiety   . Arthritis   . Asthma   . Chronic constipation   . Depression   . Fibromyalgia   . GERD (gastroesophageal reflux disease)   . 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 History of colon polyps    2007 hyperplagia  . History of esophageal dilatation    2008  . History of peptic ulcer   . History of small bowel obstruction    2012  . Hypothyroidism   . Macular degeneration of right  eye   . Numbness of left foot    4 toes  . Paget's disease of vulva   . Personal history of chemotherapy 1997  . Personal history of radiation therapy 2005  . PONV (postoperative nausea and vomiting)    and HARD TO WAKE  . Seasonal allergies   . SUI (stress urinary incontinence, female)   . Wears glasses     Past Surgical History:  Procedure Laterality Date  . BLADDER SUSPENSION  10/13/2011   Procedure: TRANSVAGINAL TAPE (TVT) PROCEDURE;  Surgeon: Delice Lesch, MD;  Location: [facility];  Service: Gynecology;  Laterality: N/A;  . BREAST BIOPSY Left 03/19/2004  . BREAST LUMPECTOMY Left 03-27-2004  . CARDIAC CATHETERIZATION  10-30-2000  dr Wynonia Lawman   normal coronary arteries and LVF/  ef 65-70%  . CLOSED MANIPULATION  W/ OPEN REDUCTION EXCHANGE TIBIAL FEMORAL BEARING POST TOTAL LEFT KNEE  04-14-2001  . CYSTOSCOPY  10/13/2011   Procedure: CYSTOSCOPY;  Surgeon: Delice Lesch, MD;  Location: [facility];  Service: Gynecology;  Laterality: Bilateral;  . DILATION AND CURETTAGE OF UTERUS  1968  . FOOT SURGERY Left 02-01-2009   TRIPLE ARTHRODESIS  . FOOT SURGERY Left 11/14   I&D left foot with woundvac placement  . INCISIONAL HERNIA REPAIR  05/05/2011   Procedure: LAPAROSCOPIC INCISIONAL HERNIA;  Surgeon: Edward Jolly, MD;  Location: [facility];  Service: General;  Laterality: N/A;  LAPAROSCOPIC REPAIR INCARCERATED INCISIONAL HERNIA  WITH MESH  . KNEE ARTHROSCOPY Bilateral left [id number] & 04-1992/   right [phone]  . LAPAROSCOPIC CHOLECYSTECTOMY  06-21-2010  . LEFT HEART CATH AND CORONARY ANGIOGRAPHY N/A 04/13/2017   Procedure: LEFT HEART CATH AND CORONARY ANGIOGRAPHY;  Surgeon: Jettie Booze, MD;  Location: [facility];  Service: Cardiovascular;  Laterality: N/A;  . LUMBAR SPINE SURGERY  1998   L5 --  S1  . MASTECTOMY Right 1997   W/  LYMPH NODE DISSECTION AND RECONSTRUCTION WITH IMPLANTS AND EXPANDER  . NEGATIVE SLEEP STUDY  12/26/2015  . PORT-A-CATH PLACEMENT AND REMOVAL   1997  . REMOVAL RIGHT BREAST IMPLANT AND CAPSULECTOMY  06-03-1999  . REVISION TOTAL KNEE ARTHROPLASTY Left 07-07-2005  &  12-10-2001   12-10-2001  REMOVAL TOTAL KNEE/ I&D / INSERTION ANTIBIOTIC SPACER  . REVISION TOTAL KNEE ARTHROPLASTY Right 07-07-2005  . ROTATOR CUFF REPAIR Right   . TENDON REPAIR RIGHT RING FINGER  2011  . THUMB TRIGGER RELEASE Right 1993  . THYROIDECTOMY, PARTIAL  1983  . TONSILLECTOMY  1968  . TOTAL KNEE ARTHROPLASTY Bilateral right 05-04-2000/  left 03-29-2001  . VULVECTOMY  05/27/2012   Procedure: WIDE EXCISION VULVECTOMY;  Surgeon: Imagene Gurney A. Alycia Rossetti, MD;  Location: [facility];  Service: Gynecology;  Laterality: N/A;  Wide Local Excision of Vulva   . VULVECTOMY N/A 11/23/2012   Procedure: WIDE LOCAL EXCISION OF THE VULVA;  Surgeon: Imagene Gurney A. Alycia Rossetti, MD;  Location: [facility];  Service: Gynecology;  Laterality: N/A;  left inferior vulvar biopsy excision left superior lesion left inferior vulvar excision  . VULVECTOMY N/A 08/02/2013   Procedure: WIDE LOCAL  EXCISION VULVA;  Surgeon: Imagene Gurney A. Alycia Rossetti, MD;  Location: [facility];  Service: Gynecology;  Laterality: N/A;  . VULVECTOMY Left 03/29/2014   Procedure: WIDE LOCAL EXCISION OF LEFT VULVA ;  Surgeon: Imagene Gurney A. Alycia Rossetti, MD;  Location: [facility];  Service: Gynecology;  Laterality: Left;  Marland Kitchen VULVECTOMY Bilateral 01/24/2015   Procedure: WIDE LOCAL EXCISION VULVA;  Surgeon: [name], MD;  Location: [facility];  Service: Gynecology;  Laterality: Bilateral;  . VULVECTOMY PARTIAL  07-16-2010    Current Outpatient Medications  Medication Sig Dispense Refill  . albuterol (PROVENTIL HFA;VENTOLIN HFA) 108 (90 BASE) MCG/ACT inhaler Inhale 2 puffs into the lungs every 4 (four) hours as needed for wheezing or shortness of breath. Reported on 08/01/2015    . aspirin EC 81 MG EC tablet Take 1 tablet (81 mg total) daily by mouth. 30 tablet 0  . atorvastatin (LIPITOR) 40 MG tablet Take 1 tablet (40 mg total) daily at 6 PM  by mouth. 30 tablet 0  . baclofen (LIORESAL) 10 MG tablet Take 1 tablet (10 mg total) by mouth 2 (two) times daily. 180 each 0  . buPROPion (WELLBUTRIN XL) 150 MG 24 hr tablet Take 150 mg by mouth every morning.     . Calcium Carbonate-Vitamin D 600-125 MG-UNIT TABS Take 1 tablet by mouth daily.     . Cholecalciferol (VITAMIN D3) 1000 UNITS CAPS Take 2 capsules by mouth daily.    . citalopram (CELEXA) 40 MG tablet Take 40 mg by mouth every morning.     . clindamycin (CLEOCIN) 150 MG capsule Take 4 tablets 30 minutesprior to dental procedure.    . furosemide (LASIX) 20 MG tablet Take 40 mg by mouth daily.     Marland Kitchen ibandronate (BONIVA) 150 MG tablet Take 150 mg every 30 (thirty) days by mouth. Take in the morning with a full glass of water, on an empty stomach, and do not take anything else by mouth or lie down for the next 30 min.    Marland Kitchen levothyroxine (SYNTHROID) 175 MCG tablet TAKE 1 TABLET BY MOUTH EVERY DAY    . metoprolol tartrate (LOPRESSOR) 25 MG tablet Take 0.5 tablets (12.5 mg total) 2 (two) times daily by mouth. 60 tablet 0  . montelukast (SINGULAIR)  10 MG tablet Take 10 mg daily by mouth.     . Multiple Vitamin (MULTIVITAMIN WITH MINERALS) TABS tablet Take 1 tablet daily by mouth.    . nystatin cream (MYCOSTATIN) Apply 1 application topically 2 (two) times daily. 90 g 3  . Omega-3 1000 MG CAPS Take 2 g daily by mouth.     . pantoprazole (PROTONIX) 40 MG tablet Take 40 mg by mouth every morning.     . polyvinyl alcohol (LIQUIFILM TEARS) 1.4 % ophthalmic solution Place 1 drop into both eyes as needed for dry eyes.     No current facility-administered medications for this visit.     Social History   Socioeconomic History  . Marital status: Married    Spouse name: Not on file  . Number of children: Not on file  . Years of education: Not on file  . Highest education level: Not on file  Occupational History  . Not on file  Social Needs  . Financial resource strain: Not on file  . Food  insecurity:    Worry: Not on file    Inability: Not on file  . Transportation needs:    Medical: Not on file    Non-medical: Not on file  Tobacco Use  . Smoking status: Former Smoker    Packs/day: 2.00    Years: 41.00    Pack years: 82.00    Types: Cigarettes    Last attempt to quit: 06/05/1999    Years since quitting: 18.3  . Smokeless tobacco: Never Used  Substance and Sexual Activity  . Alcohol use: No  . Drug use: No  . Sexual activity: Not on file  Lifestyle  . Physical activity:    Days per week: Not on file    Minutes per session: Not on file  . Stress: Not on file  Relationships  . Social connections:    Talks on phone: Not on file    Gets together: Not on file    Attends religious service: Not on file    Active member of club or organization: Not on file    Attends meetings of clubs or organizations: Not on file    Relationship status: Not on file  . Intimate partner violence:    Fear of current or ex partner: Not on file    Emotionally abused: Not on file    Physically abused: Not on file    Forced sexual activity: Not on file  Other Topics Concern  . Not on file  Social History Narrative  . Not on file    Family History  Problem Relation Age of Onset  . Cancer Father 12       lung and kidney   . Alzheimer's disease Mother   . Heart attack Maternal Grandmother   . Alcohol abuse Brother   . Breast cancer Paternal Aunt   . Breast cancer Cousin   . Breast cancer Cousin       Marti Sleigh, MD 09/18/2017, 3:22 PM

## 2017-10-08 ENCOUNTER — Ambulatory Visit
Admission: RE | Admit: 2017-10-08 | Discharge: 2017-10-08 | Disposition: A | Discharge status: Home / Self Care | Payer: Medicare HMO | Source: Ambulatory Visit | Attending: General Surgery | Admitting: General Surgery

## 2017-10-08 DIAGNOSIS — Z1231 Encounter for screening mammogram for malignant neoplasm of breast: Secondary | ICD-10-CM

## 2017-10-13 ENCOUNTER — Other Ambulatory Visit (INDEPENDENT_AMBULATORY_CARE_PROVIDER_SITE_OTHER): Payer: Self-pay | Admitting: Orthopedic Surgery

## 2017-10-13 NOTE — Telephone Encounter (Signed)
Rx request 

## 2017-10-13 NOTE — Telephone Encounter (Signed)
y

## 2017-10-28 ENCOUNTER — Telehealth (INDEPENDENT_AMBULATORY_CARE_PROVIDER_SITE_OTHER): Payer: Self-pay | Admitting: Orthopedic Surgery

## 2017-10-28 NOTE — Telephone Encounter (Signed)
Med request for pain pt is currently taking baclofen(Lioresal)10mg  tablet

## 2017-10-28 NOTE — Telephone Encounter (Signed)
Ok to refill 

## 2017-10-29 MED ORDER — BACLOFEN 10 MG PO TABS: 10.0000 mg | ORAL_TABLET | Freq: Two times a day (BID) | ORAL | 0 refills | Status: DC

## 2017-10-29 NOTE — Telephone Encounter (Signed)
y

## 2017-10-29 NOTE — Telephone Encounter (Signed)
Submitted to pharmacy 

## 2017-12-03 ENCOUNTER — Inpatient Hospital Stay: Payer: Medicare HMO | Attending: Gynecology | Admitting: Gynecologic Oncology

## 2017-12-03 VITALS — BP 118/68 | HR 79 | Temp 98.3°F | Resp 20 | Ht 65.5 in | Wt 247.0 lb

## 2017-12-03 DIAGNOSIS — C519 Malignant neoplasm of vulva, unspecified: Secondary | ICD-10-CM | POA: Insufficient documentation

## 2017-12-03 DIAGNOSIS — C4499 Other specified malignant neoplasm of skin, unspecified: Secondary | ICD-10-CM

## 2017-12-03 NOTE — Patient Instructions (Signed)
We will contact you with the results of your biopsies from today.  Please call for any questions or concerns.   Vulva Biopsy, Care After Refer to this sheet in the next few weeks. These instructions provide you with information about caring for yourself after your procedure. Your health care provider may also give you more specific instructions. Your treatment has been planned according to current medical practices, but problems sometimes occur. Call your health care provider if you have any problems or questions after your procedure. What can I expect after the procedure? After the procedure, it is common to have:  Slight bleeding from the biopsy site.  Discomfort at the biopsy site.  Follow these instructions at home: Biopsy Site Care   Do not rub the biopsy area after urinating. Gently pat the area dry or use a bottle filled with warm water (peri-bottle) to clean the area. Gently wipe from front to back.  Follow instructions from your health care provider about how to take care of your biopsy site. Make sure you: ? Clean the area using water and mild soap twice a day or as told by your health care provider. Gently pat the area dry. ? If you were prescribed an antibiotic medical ointment, apply it as told by your health care provider. Do not stop using the antibiotic even if your condition improves. ? Take a warm water bath that is taken while you are sitting down (sitz bath) as needed to help with pain or discomfort. ? Leave stitches (sutures), skin glue, or adhesive strips in place. These skin closures may need to stay in place for 2 weeks or longer. If adhesive strip edges start to loosen and curl up, you may trim the loose edges. Do not remove adhesive strips completely unless your health care provider tells you to do that.  Check your biopsy site every day for signs of infection. Check for: ? More redness, swelling, or pain. ? More fluid or blood. ? Warmth. ? Pus or a bad  smell. Lifestyle  Wear loose, cotton underwear. Do not wear tight pants.  Do not use a tampon, douche, or put anything inside your vagina for at least one week or until your health care provider approves.  Do not have sex for at least one week or until your health care provider approves.  Do not exercise, such as running or biking, until your health care provider approves.  Do not take baths, swim, or use a hot tub until your health care provider approves. General instructions  Take over-the-counter and prescription medicines only as told by your health care provider.  Use a sanitary napkin until bleeding stops.  Keep all follow-up visits as told by your health care provider. This is important.  If the sample is being sent for testing, it is your responsibility to get the results of your procedure. Ask your health care provider or the department performing the procedure when your results will be ready. Contact a health care provider if:  You have more redness, swelling, or pain around your biopsy site.  You have more fluid or blood coming from your biopsy site.  Your biopsy site feels warm to the touch.  Your pain is not controlled with medicine. Get help right away if:  You have heavy bleeding from the vulva.  You have pus or a bad smell coming from your biopsy site.  You have a fever.  You have lower belly pain. This information is not intended to replace advice  given to you by your health care provider. Make sure you discuss any questions you have with your health care provider. Document Released: 04/28/2012 Document Revised: 10/18/2015 Document Reviewed: 04/02/2015 Elsevier Interactive Patient Education  [employer].

## 2017-12-03 NOTE — Progress Notes (Signed)
GYN ONCOLOGY OFFICE VISIT   [name] 72 y.o. female  Chief Complaint  Patient presents with  . Paget's disease of vulva    Assessment : Long-standing history of Paget's disease of the vulva. Skin changes on the lateral aspect of the left thigh skin grafts concerning for recurrent Extramammary Paget's.  Biopsies were collected at the inferior lateral aspect in the mid lateral aspect of the thigh.  The nodule on the right aspect of the residual labia minora appeared consistent with a sebaceous cyst.  The epithelium overlying the cyst was biopsied  Plan: Follow-up with Dr. Fermin Schwab August 2019.  Interval History:  Patient called and requested an appointment be because of intense vulvar pruritus at the lateral aspect of the skin graft for approximately 1 week.  Her husband examined the area recently and expressed concern about the appearance of the skin.  She also reports the nodule associated with pruritus in the upper aspect of the residual right labia.  HPI:       DATE PROCEDURE TREATMENT      February 2012 Partial simple vulvectomy  Paget's disease. Surgical margins were positive.  January 2014  Wide local excision of the left vulva  2 foci of extramammary Paget's disease extending to the left intact margin in the right and anterior tip margins at multiple foci.  July 2014 Wide local excision of 2 areas in the posterior fourchette biopsy Pathology from the 2 excisions showed extramammary Paget's disease, margins on left superior lesion were negative, margin at 12:00 on inferior lesion focally involved. Perineal body biopsy positive for Paget's disease.   March 2015 WLE vulva Findings:a pill raised pink lesion measuring 1 x 0.5 cm of the left upper vulva near the prior excision site and a second pill raised lesion measuring 1.5 x 1 cm on the left crossing midline perineal body. Pathology showed that the left superior vulvar excision was Paget's disease extending to the 9:00  margin. Other margins were negative. The left midline lesion also was Paget's disease extending to the 12:00 margin and focally to the 6:00 margin.   October 2015 Left vulva bx Paget's disease  November 2015  WLE left vulva Paget's disease extending to the 6, 9, and 12:00 margins.  February 2016  Concern for recurrence Aldara and clobetasol  May 2016  Biopsy right vulva-Pagets Aldara  August 2016  Bilateral wide local excision of the vulva   Both wide local excision specimens were positive for Paget's disease with extension to focal margins on each.  September 2017 Preoperative mapping of her lesion for her planned wide local excision of the left vulva and smaller excision on the right.   October 2017 A: Wide radical excision of bilateral vulva and perineum L medial thigh V-Y advancement flap for coverage.30x16cm   Local rotational flap to right vulvar/labial defect. 10x3cm    - (Recurrent/persisent) Paget disease  - Disease size approximately 8.3 x 4.6 cm  - Disease confined to the epidermis - Multiple epidermal margins are positive for disease: 6 o'clock to 11 o'clock (per original orientation)  - Deep margin free of involvement  February 2019  Wide local excision of a chronic open wound on her left thigh  Chronic granulation tissue                 Review of Systems: review of systems is negative except as noted in interval history.   Vitals: Blood pressure 118/68, pulse 79, temperature 98.3 F (36.8 C), temperature source Oral,  resp. rate 20, height 5' 5.5" (1.664 m), weight 247 lb (112 kg), SpO2 96 %.  Physical Exam: General : The patient is a healthy woman in no acute distress.  HEENT: normocephalic, extraoccular movements normal; neck is supple without thyromegally  LN: Supraclavicular and inguinal nodes not enlarged  Abdomen: Soft, non-tender, no ascites, no organomegally, no masses, no hernias  Pelvic:  EGBUS: Significantly distorted by prior partial vulvectomy's and  plastic surgery reconstruction.  No gross lesions are noted.  The area where she has pruritus presents the lateral edge of the left sided skin graft.  The changes are consistent with Paget's.  The area of pruritus on the superior right residual labia is notable for sebaceous cyst.  Biopsy was collected for the overlying epithelium in this area and the sebaceous cyst reduced.  VULVAR BIOPSIES  Procedure explained to patient.  The superior right labia and the lateral aspect of the skin graft at the site of the labial gluteal fold  was prepped with betadine.  The areas were infiltrated with lidocaine.  3 mm punch biopsies were taken from 1: Inferior left lateral skin graft 2: Mid lateral left thigh graft 3: Superior right labia minora.  There was bleeding that was  controlled with silver nitrate and Monsel and pressure for 4 minutes.  . Patient tolerated the procedure well.     Past Medical History:  Diagnosis Date  . Anxiety   . Arthritis   . Asthma   . Chronic constipation   . Depression   . Fibromyalgia   . GERD (gastroesophageal reflux disease)   . 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 History of colon polyps    2007 hyperplagia  . History of esophageal dilatation    2008  . History of peptic ulcer   . History of small bowel obstruction    2012  . Hypothyroidism   . Macular degeneration of right eye   . Numbness of left foot    4 toes  . Paget's disease of vulva   . Personal history of chemotherapy 1997  . Personal history of radiation therapy 2005  . PONV (postoperative nausea and vomiting)    and HARD TO WAKE  . Seasonal allergies   . SUI (stress urinary incontinence, female)   . Wears glasses     Past Surgical History:  Procedure Laterality Date  . BLADDER SUSPENSION  10/13/2011   Procedure: TRANSVAGINAL TAPE (TVT)  PROCEDURE;  Surgeon: Delice Lesch, MD;  Location: [facility];  Service: Gynecology;  Laterality: N/A;  . BREAST BIOPSY Left 03/19/2004  . BREAST LUMPECTOMY Left 03-27-2004  . CARDIAC CATHETERIZATION  10-30-2000  dr Wynonia Lawman   normal coronary arteries and LVF/  ef 65-70%  . CLOSED MANIPULATION  W/ OPEN REDUCTION EXCHANGE TIBIAL FEMORAL BEARING POST TOTAL LEFT KNEE  04-14-2001  . CYSTOSCOPY  10/13/2011   Procedure: CYSTOSCOPY;  Surgeon: Delice Lesch, MD;  Location: [facility];  Service: Gynecology;  Laterality: Bilateral;  . DILATION AND CURETTAGE OF UTERUS  1968  . FOOT SURGERY Left 02-01-2009   TRIPLE ARTHRODESIS  . FOOT SURGERY Left 11/14   I&D left foot with woundvac placement  . INCISIONAL HERNIA REPAIR  05/05/2011   Procedure: LAPAROSCOPIC INCISIONAL HERNIA;  Surgeon: Edward Jolly, MD;  Location: [facility];  Service: General;  Laterality:  N/A;  LAPAROSCOPIC REPAIR INCARCERATED INCISIONAL HERNIA  WITH MESH  . KNEE ARTHROSCOPY Bilateral left [phone] & 04-1992/   right [phone]  . LAPAROSCOPIC CHOLECYSTECTOMY  06-21-2010  . LEFT HEART CATH AND CORONARY ANGIOGRAPHY N/A 04/13/2017   Procedure: LEFT HEART CATH AND CORONARY ANGIOGRAPHY;  Surgeon: Jettie Booze, MD;  Location: [facility];  Service: Cardiovascular;  Laterality: N/A;  . LUMBAR SPINE SURGERY  1998   L5 -- S1  . MASTECTOMY Right 1997   W/  LYMPH NODE DISSECTION AND RECONSTRUCTION WITH IMPLANTS AND EXPANDER  . NEGATIVE SLEEP STUDY  12/26/2015  . PORT-A-CATH PLACEMENT AND REMOVAL  1997  . REMOVAL RIGHT BREAST IMPLANT AND CAPSULECTOMY  06-03-1999  . REVISION TOTAL KNEE ARTHROPLASTY Left 07-07-2005  &  12-10-2001   12-10-2001  REMOVAL TOTAL KNEE/ I&D / INSERTION ANTIBIOTIC SPACER  . REVISION TOTAL KNEE ARTHROPLASTY Right 07-07-2005  . ROTATOR CUFF REPAIR Right   . TENDON REPAIR RIGHT RING FINGER  2011  . THUMB TRIGGER RELEASE Right 1993  . THYROIDECTOMY, PARTIAL  1983  . TONSILLECTOMY  1968  . TOTAL KNEE  ARTHROPLASTY Bilateral right 05-04-2000/  left 03-29-2001  . VULVECTOMY  05/27/2012   Procedure: WIDE EXCISION VULVECTOMY;  Surgeon: Imagene Gurney A. Alycia Rossetti, MD;  Location: [facility];  Service: Gynecology;  Laterality: N/A;  Wide Local Excision of Vulva   . VULVECTOMY N/A 11/23/2012   Procedure: WIDE LOCAL EXCISION OF THE VULVA;  Surgeon: Imagene Gurney A. Alycia Rossetti, MD;  Location: [facility];  Service: Gynecology;  Laterality: N/A;  left inferior vulvar biopsy excision left superior lesion left inferior vulvar excision  . VULVECTOMY N/A 08/02/2013   Procedure: WIDE LOCAL  EXCISION VULVA;  Surgeon: Imagene Gurney A. Alycia Rossetti, MD;  Location: [facility];  Service: Gynecology;  Laterality: N/A;  . VULVECTOMY Left 03/29/2014   Procedure: WIDE LOCAL EXCISION OF LEFT VULVA ;  Surgeon: Imagene Gurney A. Alycia Rossetti, MD;  Location: [facility];  Service: Gynecology;  Laterality: Left;  Marland Kitchen VULVECTOMY Bilateral 01/24/2015   Procedure: WIDE LOCAL EXCISION VULVA;  Surgeon: [name], MD;  Location: [facility];  Service: Gynecology;  Laterality: Bilateral;  . VULVECTOMY PARTIAL  07-16-2010    Current Outpatient Medications  Medication Sig Dispense Refill  . albuterol (PROVENTIL HFA;VENTOLIN HFA) 108 (90 BASE) MCG/ACT inhaler Inhale 2 puffs into the lungs every 4 (four) hours as needed for wheezing or shortness of breath. Reported on 08/01/2015    . aspirin EC 81 MG EC tablet Take 1 tablet (81 mg total) daily by mouth. 30 tablet 0  . atorvastatin (LIPITOR) 40 MG tablet Take 1 tablet (40 mg total) daily at 6 PM by mouth. 30 tablet 0  . baclofen (LIORESAL) 10 MG tablet Take 1 tablet (10 mg total) by mouth 2 (two) times daily. 180 tablet 0  . buPROPion (WELLBUTRIN XL) 150 MG 24 hr tablet Take 150 mg by mouth every morning.     . Calcium Carbonate-Vitamin D 600-125 MG-UNIT TABS Take 1 tablet by mouth daily.     . Cholecalciferol (VITAMIN D3) 1000 UNITS CAPS Take 2 capsules by mouth daily.    . citalopram (CELEXA) 40 MG tablet Take 40 mg by  mouth every morning.     . clindamycin (CLEOCIN) 150 MG capsule Take 4 tablets 30 minutesprior to dental procedure.    . furosemide (LASIX) 20 MG tablet Take 40 mg by mouth daily.     Marland Kitchen ibandronate (BONIVA) 150 MG tablet Take 150 mg every 30 (thirty) days  by mouth. Take in the morning with a full glass of water, on an empty stomach, and do not take anything else by mouth or lie down for the next 30 min.    Marland Kitchen levothyroxine (SYNTHROID) 175 MCG tablet TAKE 1 TABLET BY MOUTH EVERY DAY    . montelukast (SINGULAIR) 10 MG tablet Take 10 mg daily by mouth.     . Multiple Vitamin (MULTIVITAMIN WITH MINERALS) TABS tablet Take 1 tablet daily by mouth.    . nystatin cream (MYCOSTATIN) Apply 1 application topically 2 (two) times daily. 90 g 3  . Omega-3 1000 MG CAPS Take 2 g daily by mouth.     . pantoprazole (PROTONIX) 40 MG tablet Take 40 mg by mouth every morning.     . polyvinyl alcohol (LIQUIFILM TEARS) 1.4 % ophthalmic solution Place 1 drop into both eyes as needed for dry eyes.     No current facility-administered medications for this visit.     Social History   Socioeconomic History  . Marital status: Married    Spouse name: Not on file  . Number of children: Not on file  . Years of education: Not on file  . Highest education level: Not on file  Occupational History  . Not on file  Social Needs  . Financial resource strain: Not on file  . Food insecurity:    Worry: Not on file    Inability: Not on file  . Transportation needs:    Medical: Not on file    Non-medical: Not on file  Tobacco Use  . Smoking status: Former Smoker    Packs/day: 2.00    Years: 41.00    Pack years: 82.00    Types: Cigarettes    Last attempt to quit: 06/05/1999    Years since quitting: 18.5  . Smokeless tobacco: Never Used  Substance and Sexual Activity  . Alcohol use: No  . Drug use: No  . Sexual activity: Not on file  Lifestyle  . Physical activity:    Days per week: Not on file    Minutes per  session: Not on file  . Stress: Not on file  Relationships  . Social connections:    Talks on phone: Not on file    Gets together: Not on file    Attends religious service: Not on file    Active member of club or organization: Not on file    Attends meetings of clubs or organizations: Not on file    Relationship status: Not on file  . Intimate partner violence:    Fear of current or ex partner: Not on file    Emotionally abused: Not on file    Physically abused: Not on file    Forced sexual activity: Not on file  Other Topics Concern  . Not on file  Social History Narrative  . Not on file    Family History  Problem Relation Age of Onset  . Cancer Father 24       lung and kidney   . Alzheimer's disease Mother   . Heart attack Maternal Grandmother   . Alcohol abuse Brother   . Breast cancer Paternal Aunt   . Breast cancer Cousin   . Breast cancer Cousin       Janie Morning, MD 12/03/2017, 3:26 PM

## 2017-12-07 ENCOUNTER — Telehealth: Payer: Self-pay | Admitting: Gynecologic Oncology

## 2017-12-07 ENCOUNTER — Encounter: Payer: Self-pay | Admitting: Gynecologic Oncology

## 2017-12-07 NOTE — Progress Notes (Signed)
Patient came to the office after visiting a friend in the hospital.  Biopsy results discussed.  No concerns voiced.  Advised to call for any needs or concerns.

## 2017-12-07 NOTE — Telephone Encounter (Signed)
Attempted to call patient to discuss biopsy results.  Left message asking her to please call the office to discuss.

## 2017-12-17 ENCOUNTER — Ambulatory Visit: Payer: Medicare HMO | Admitting: Podiatry

## 2017-12-17 ENCOUNTER — Encounter: Payer: Self-pay | Admitting: Podiatry

## 2017-12-17 DIAGNOSIS — M779 Enthesopathy, unspecified: Secondary | ICD-10-CM

## 2017-12-17 DIAGNOSIS — L84 Corns and callosities: Secondary | ICD-10-CM

## 2017-12-17 MED ORDER — TRIAMCINOLONE ACETONIDE 10 MG/ML IJ SUSP
10.0000 mg | Freq: Once | INTRAMUSCULAR | Status: AC
  Administered 2017-12-17: 10 mg

## 2017-12-18 NOTE — Progress Notes (Signed)
Subjective:   Patient ID: [name], female   DOB: 72 y.o.   MRN: [id number]   HPI Patient presents with inflammation fifth metatarsal base with keratotic lesion that is painful when pressed and making it difficult to walk.  Patient states fluid has buildup and she also needs medication besides trimming   ROS      Objective:  Physical Exam  Neurovascular status found to be intact with inflammation pain of the fifth MPJ left plantar with keratotic lesion formation that contributory     Assessment:  Capsulitis of the fifth MPJ plantar along with keratotic lesion formation that the pain     Plan:  Injected the plantar capsule 3 mg Kenalog 5 mg Xylocaine and went ahead debrided the lesion fully applied padding to take pressure off this and advised on returning as symptoms indicate

## 2018-01-05 ENCOUNTER — Encounter: Payer: Self-pay | Admitting: Gynecology

## 2018-01-05 ENCOUNTER — Inpatient Hospital Stay: Payer: Medicare HMO | Attending: Gynecology | Admitting: Gynecology

## 2018-01-05 VITALS — BP 133/65 | HR 80 | Temp 97.9°F | Resp 20 | Ht 65.5 in | Wt 247.0 lb

## 2018-01-05 DIAGNOSIS — B3789 Other sites of candidiasis: Secondary | ICD-10-CM | POA: Insufficient documentation

## 2018-01-05 DIAGNOSIS — C519 Malignant neoplasm of vulva, unspecified: Secondary | ICD-10-CM | POA: Diagnosis present

## 2018-01-05 DIAGNOSIS — C4499 Other specified malignant neoplasm of skin, unspecified: Secondary | ICD-10-CM

## 2018-01-05 MED ORDER — HYDROXYZINE HCL 25 MG PO TABS: 25.0000 mg | ORAL_TABLET | Freq: Two times a day (BID) | ORAL | 0 refills | Status: DC | PRN

## 2018-01-05 NOTE — Patient Instructions (Signed)
Plan to follow up in three months or sooner if needed.  You can take the Atarax twice daily for itching.  Use caution if it makes you drowsy and do not take and drive in that case.  Please call for any questions or concerns.

## 2018-01-05 NOTE — Progress Notes (Signed)
Consult Note: Gyn-Onc   [name] 72 y.o. female  Chief Complaint  Patient presents with  . Paget's disease of vulva    Assessment : Long-standing history of Paget's disease of the vulva. Examination remainder of the vulva shows no evidence of Paget's disease.  However, the patient has a severe yeast infection especially in her labial crural folds and inguinal region.  Plan: The patient will switch to using nystatin powder where she has a yeast infection.  She is also given a prescription for Atarax 25 mg twice daily to try to reduce her scratching.  The patient will return to see me in 3 months  Interval History:  Patient returns today as previously scheduled.  She recently saw Dr. Skeet Moreno who obtained 3 vulvar biopsies.  All of which returned showing no evidence of Paget's disease.  The patient has been battling a yeast infection in her pannus and labial crural folds and inguinal region.  She is been using topical Neosporin with not much relief.  She is also admits to scratching this area continually.Marland Kitchen  HPI:  Patient is a 72 year old with a history of a partial simple vulvectomy in February of 2012 for vulvar Paget's disease. Some of the surgical margins were positive. Her postoperative course was uncomplicated. She was last seen by our service in 12/14. At that time there was no evidence of any lesions consistent with Paget's. However, there is considerable amount of excoriations throughout the entire vulvar and medial thighs consistent with chronic vulvitis.In December 2013 she had a hernia repair due to bowel obstruction by Dr. Excell Moreno with permanent mesh. In May 2013 she also had a TVT bladder by Dr. Everett Moreno. She does continue to wear a pad and also takes Detrol twice daily but states that her urinary leakage is markedly improved. She described the vulvar irritation is intermittent itching with burning and that it has increased over time as has the pruritus. She does use a  steroid cream twice daily and does scratch her vulva.   She underwent a wide local excision of the left vulva on January 2 of year 2014. Operative findings included 2 separate 3 x 3 and 1.2 cm lesions consistent with Paget's on the left.  Pathology revealed 2 foci of extramammary Paget's disease extending to the left inked margin in the right and anterior tip margins at multiple foci. The posterior margin was negative for malignancy.   I performed a biopsy of this it was consistent with recurrent Paget's disease. On 11/23/2012 showed a repeat wide local excision of vulva x2 with the posterior fourchette biopsy. Findings revealed along the left labia minora/vagina is a Paget's disease measuring approximately 2.5 x 1 cm in the superior aspect of her prior left vulvar excision. The inferior aspect of a similar lesion. 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 On the left perineal body the area is somewhat excoriated pale. Biopsy was performed in this area.  Pathology revealed:  1. Vulva, biopsy, left inferior - EXTRAMAMMARY PAGET DISEASE EXTENDING TO THE EDGES OF THE BIOPSY. 2. Vulva, excision, left superior lesion suture marks 1200 - EXTRAMAMMARY PAGET DISEASE. - MARGINS NOT INVOLVED. - CLOSEST MARGIN 9 O'CLOCK, LESS THAN 0.1 CM. 3. Vulva, excision, left inferior excision suture marks 1200 - EXTRAMAMMARY PAGET DISEASE, 12 O'CLOCK MARGIN FOCALLY INVOLVED. - REMAINING MARGINS CLEAR.  We have discussed repeating surgery however she had multiple orthopedic issues had broken ankle. She was seen by [name] in December 2014.  Exam at that time was fairly unremarkable.  I had seen her in January of 2015. At that time exam was concerning for Paget's.   On 08/02/2013 she underwent wide local excisions of the vulva x2.  Operative findings included pale pink raised lesion measuring 1 x 0.5 cm the left upper vulva near the prior excision site. There is a pale raised  lesion measuring 1.5 x 1 cm on the left crossing midline perineal body.  Pathology revealed left superior vulvar excision with extramammary Paget's disease extending to the 9:00 margin. The other margins were negative for tumor. Of the left of midline lesion she extramammary Paget's disease extending to the 12:00 margin and focally at the 6:00 margin but otherwise negative margins. The patient at that time discussed with [place] that she was having some right lower quadrant pain. Her exam is very difficult secondary to habitus. There ultrasound was obtained. Ultrasound revealed:  FINDINGS:  Uterus Measurements: 4.6 x 3.5 x 3.8 cm. 2.5 x 1.7 x 1.6 cm uterine fibroid. This is present in the right lateral aspect of the body uterus.  Endometrium Thickness: 6.1 mm. No focal abnormality visualized.  Right ovary: No focal abnormality.  Left ovary :No focal abnormality.  Other findings: No free fluid  IMPRESSION:  Uterine fibroid otherwise negative exam.  I saw her March 16, 2014 at which time there was an area on the left vulva with hyperkeratosis. A biopsy of that area was performed consistent with Paget's disease. On March 29, 2014 she underwent wide local excision of the vulva. On exam she had a 1 cm left vulvar lesion just below the left labia minora. She underwent a wide local excision. He received a 3.7 x 1.7 x 0.3 cm lesion. There was extramammary Paget's disease extending to the 6 to 9:00 margin in the 12:00 margin. This is despite grossly negative margins on physical examination.   She had an excision in November that revealed Diagnosis Vulva, excision, w/stitch @ 1200 - EXTRAMAMMARY PAGET'S DISEASE, EXTENDING TO THE 6-9 O'CLOCK MARGIN AND 12 O'CLOCK PERPENDICULAR MARGIN.  I saw her in February 2016 at which time there was concern for recurrent disease. She decided to proceed with Aldara therapy. She used for approximate 4 weeks and then decided to stop it secondary to the expense of the  medication. She then started using the clobetasol. While she was using the Aldara all the pruritus in her vulva resolved itself. Since she stopped it and was using the steroid cream the pruritus returned.  I saw her in May 2016 at which time she has small area consistent with Paget's on the right labia minora and a larger patch on the left. Biopsy on the right revealed extramammary Paget's. After discussion she wished to try the Aldara again. She called and requested an earlier appointment as for the last 2-3 weeks had increased itching and has been scratching so much that she's had some bleeding. I last saw her on July 6. At that time she had been having a lot of symptoms of pruritus and scratching a great deal. Vulvar examination at that time revealed a fairly macerated vulva and it was possible to discern what could be related to Paget's, effect of the Aldara therapy, trauma from scratching. She was asked to stop the Aldara therapy and use a low-dose daily cream. We asked her to refrain from scratching come in today for evaluation.  I saw her again December 26, 2104 at which time there was evidence clearly a Paget's  disease and she scheduled for surgery. On January 24, 2015 she underwent bilateral wide local excisions of the vulva. On the right vulva there was a 2 cm patch of Paget's disease in the mid labia majora. On the left side. The remaining labia minora in the area prior to the excision are smaller lesion consistent with Paget's disease.  Diagnosis 1. Vulva, excision, left wide excision - EXTRAMAMMARY PAGET'S DISEASE EXTENDING TO THE 12 O'CLOCK, 3 O'CLOCK, 6 O'CLOCK AND 9 O'CLOCK MARGINS. 2. Vulva, excision, right wide excision - EXTRAMAMMARY PAGET'S EXTENDING TO THE 3 O'CLOCK MARGIN AND THE 6 O'CLOCK POLE.  Please refer to the photograph in the operative note from Dr. Skeet Moreno for complete mapping of her lesion. She still having a little bit of spotting from the biopsy sites and some  pain.  The patient initially underwent partial simple vulvectomy in February 2012 for vulvar Paget's disease. Some of her surgical margins were positive. She had an uncomplicated post operative course. She had some evidence of vulvar irritation afterward with symptoms of pruritus and burning that increased over time and for which she was using a steroid cream twice daily. She underwent wide local excision of the left vulva in January 2014. Findings at the time of surgery were to areas consistent with Paget's on the left, one measuring 3 x 3 cm and one measuring 1.2 cm pathology revealed 2 foci of extramammary Paget's disease extending to the left intact margin in the right and anterior tip margins at multiple foci. The patient was seen for follow-up with recurrent Paget's disease noted. In July 2014, she underwent repeat wide local excision of 2 areas in the posterior fourchette biopsy. Findings were an area along the left labium minora measuring 2.5 x 1 cm at the superior aspect of her prior left vulvar excision. There was a similar lesion at the inferior aspect. It appeared that the patient also had lichen sclerosis of the right vulva. Left perineal body biopsy was performed secondary to some noted excoriation. Pathology from the 2 excisions showed extramammary Paget's disease, margins on left superior lesion were negative, margin at 12:00 on inferior lesion focally involved. Perineal body biopsy positive for Paget's disease.   The patient then underwent several orthopedic surgeries and ultimately was taken back to the operating room in March 2015 for 2 wide local excisions of the vulva. Findings at that time included a pill raised pink lesion measuring 1 x 0.5 cm of the left upper vulva near the prior excision site and a second pill raised lesion measuring 1.5 x 1 cm on the left crossing midline perineal body. Pathology showed that the left superior vulvar excision was Paget's disease extending to the 9:00  margin. Other margins were negative. The left midline lesion also was Paget's disease extending to the 12:00 margin and focally to the 6:00 margin. Patient was then seen back in October 2015 at which time there was an area on the left vulva with hyperkeratosis. A biopsy of that area confirmed Paget's disease. Patient underwent repeat wide local excision of the vulva on March 29, 2014. Findings were a 1 cm left vulvar lesion just below the left labium minora. Pathology showed Paget's disease extending to the 6, 9, and 12:00 margins. This was despite grossly negative margins on physical exam.  Patient was next seen in February 2016 at which time there was concern for recurrent disease. She decided to proceed with Aldara therapy which she used for 4 weeks and then stopped it secondary to  the expense of the medication. Started using clobetasol. Her pruritus stopped with Aldara, but unfortunately returned when she switched to clobetasol. In May 2016 she was seen and had an area that was smaller still consistent with Paget's disease on the right labia minora and a larger patch on the left. Biopsy on the right revealed extramammary Paget's. She opted to try Aldara again. Given evidence of recurrent disease she underwent bilateral wide local excision of the vulva at the end of August 2016. Both wide local excision specimens were positive for Paget's disease with extension to focal margins on each.  Most recently, the patient was taken to the operating room on 01/31/2016 for preoperative mapping of her lesion for her planned wide local excision of the left vulva and smaller excision on the right. Operative findings at that time were notable for surgically absent inferior aspect of the left labium minora and majora. Multiple areas with white flaky skin changes were biopsied and the abnormal-appearing tissue. Changes noted in the area of the clitoris and perineum and at the introitus just distal to the hymen. Biopsies were  collected on all of the sites.   10/17 Findings: 1. Surgical absent left labia majorum inferiorly from prior vulvectomies.  2. Focusing on the left vulva, there was gross disease incorporating the prior surgical site, characterized by patchy white and erythematous epithelium. The affected area spanned from the perineal body to 5-6cm laterally. Prior mapping biopsy sites x3 were well healed and visible and surrounded by what appeared to be grossly normal appearing skin. Superiorly on the left labia majorum, there was affected epithelium with a similar appearing patchy white, erythematous epithelium.  3. Focusing of the right vulva, there was less extensive disease. Affected area extended from the perineal body to roughly 3cm laterally with minimal involvement of the right labia majorum.  4. The urethra, anus, and clitoris were spared.  5. A rectal exam was performed during the procedure to confirm protection of the sphincter, which was intact at the conclusion of our case.  6. A rectocele was notable 3cm into the vagina.   Pathology: Final Diagnosis  A: Vulva, right and left, vulvectomy - (Recurrent/persisent) Paget disease  - Disease size approximately 8.3 x 4.6 cm  - Disease confined to the epidermis - Multiple epidermal margins are positive for disease: 6 o'clock to 11 o'clock (per original orientation)  - Deep margin free of involvement  In February 2019 the patient underwent wide local excision of a chronic open wound on her left thigh.  Final pathology showed this to be chronic granulation tissue.    Review of Systems:10 point review of systems is negative except as noted in interval history.   Vitals: Blood pressure 133/65, pulse 80, temperature 97.9 F (36.6 C), temperature source Oral, resp. rate 20, height 5' 5.5" (1.664 m), weight 247 lb (112 kg), SpO2 97 %.  Physical Exam: General : The patient is a healthy woman in no acute distress.  HEENT: normocephalic, extraoccular  movements normal; neck is supple without thyromegally  Lynphnodes: Supraclavicular and inguinal nodes not enlarged  Abdomen: Soft, non-tender, no ascites, no organomegally, no masses, no hernias  Pelvic:  EGBUS: Significantly distorted by prior partial vulvectomy's and plastic surgery reconstruction.  No gross lesions are noted.  The area where she has pruritus is intact but slightly erythematous.  This does not appear to have a classic Paget's disease picture at the present time.   At the end of the incision used to create the flap  on the left side is a 2 cm area that is granulating.  There is no evidence of cellulitis.  Some absorbable sutures removed without difficulty. Vagina: Normal, no lesions  Urethra and Bladder: Normal, non-tender   Lower extremities: No edema or varicosities. Normal range of motion      Allergies  Allergen Reactions  . Hydromorphone Hcl Itching and Rash    Per CRNA, pt tol Fentanyl IV for OR case, no issues reported to rec RN in PACU, no immediate PACU issues noted as well upon arrival  . Demerol  [Meperidine Hcl] Other (See Comments)    "Knocks me out"  . Other Other (See Comments)    Blisters  . Codeine Other (See Comments)    hallucinations Hyper/ Hallucinations   . Demerol [Meperidine] Other (See Comments)    "Knocks me out"  . Diazepam Other (See Comments)    Extreme sedation; patient does not recall taking diazepam (12/07/14)  . Tape Hives and Other (See Comments)    "thick clear plastic tape" causes blisters Blisters  . Macrobid [Nitrofurantoin] Rash  . Morphine And Related Itching and Rash    Per CRNA, pt tol Fentanyl IV for OR case, no issues reported to rec RN in PACU, no immediate PACU issues noted as well upon arrival  . Penicillins Hives and Swelling  . Valisone [Betamethasone] Rash    Past Medical History:  Diagnosis Date  . Anxiety   . Arthritis   . Asthma   . Chronic constipation   . Depression   . Fibromyalgia   . GERD  (gastroesophageal reflux disease)   . 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 History of colon polyps    2007 hyperplagia  . History of esophageal dilatation    2008  . History of peptic ulcer   . History of small bowel obstruction    2012  . Hypothyroidism   . Macular degeneration of right eye   . Numbness of left foot    4 toes  . Paget's disease of vulva   . Personal history of chemotherapy 1997  . Personal history of radiation therapy 2005  . PONV (postoperative nausea and vomiting)    and HARD TO WAKE  . Seasonal allergies   . SUI (stress urinary incontinence, female)   . Wears glasses     Past Surgical History:  Procedure Laterality Date  . BLADDER SUSPENSION  10/13/2011   Procedure: TRANSVAGINAL TAPE (TVT) PROCEDURE;  Surgeon: Delice Lesch, MD;  Location: [facility];  Service: Gynecology;  Laterality: N/A;  . BREAST BIOPSY Left 03/19/2004  . BREAST LUMPECTOMY Left 03-27-2004  . CARDIAC CATHETERIZATION  10-30-2000  dr Wynonia Lawman   normal coronary arteries and LVF/  ef 65-70%  . CLOSED MANIPULATION  W/ OPEN REDUCTION EXCHANGE TIBIAL FEMORAL BEARING POST TOTAL LEFT KNEE  04-14-2001  . CYSTOSCOPY  10/13/2011   Procedure: CYSTOSCOPY;  Surgeon: Delice Lesch, MD;  Location: [facility];  Service: Gynecology;  Laterality: Bilateral;  . DILATION AND CURETTAGE OF UTERUS  1968  . FOOT SURGERY Left 02-01-2009   TRIPLE ARTHRODESIS  . FOOT SURGERY Left 11/14   I&D left foot with woundvac placement  . INCISIONAL HERNIA REPAIR  05/05/2011   Procedure: LAPAROSCOPIC INCISIONAL HERNIA;  Surgeon: Edward Jolly, MD;  Location: [facility];  Service: General;  Laterality: N/A;  LAPAROSCOPIC REPAIR INCARCERATED INCISIONAL HERNIA  WITH MESH  . KNEE ARTHROSCOPY Bilateral left [phone] & 04-1992/   right [id number]  . LAPAROSCOPIC CHOLECYSTECTOMY  06-21-2010   . LEFT HEART CATH AND CORONARY ANGIOGRAPHY N/A 04/13/2017   Procedure: LEFT HEART CATH AND CORONARY ANGIOGRAPHY;  Surgeon: Jettie Booze, MD;  Location: [facility];  Service: Cardiovascular;  Laterality: N/A;  . LUMBAR SPINE SURGERY  1998   L5 -- S1  . MASTECTOMY Right 1997   W/  LYMPH NODE DISSECTION AND RECONSTRUCTION WITH IMPLANTS AND EXPANDER  . NEGATIVE SLEEP STUDY  12/26/2015  . PORT-A-CATH PLACEMENT AND REMOVAL  1997  . REMOVAL RIGHT BREAST IMPLANT AND CAPSULECTOMY  06-03-1999  . REVISION TOTAL KNEE ARTHROPLASTY Left 07-07-2005  &  12-10-2001   12-10-2001  REMOVAL TOTAL KNEE/ I&D / INSERTION ANTIBIOTIC SPACER  . REVISION TOTAL KNEE ARTHROPLASTY Right 07-07-2005  . ROTATOR CUFF REPAIR Right   . TENDON REPAIR RIGHT RING FINGER  2011  . THUMB TRIGGER RELEASE Right 1993  . THYROIDECTOMY, PARTIAL  1983  . TONSILLECTOMY  1968  . TOTAL KNEE ARTHROPLASTY Bilateral right 05-04-2000/  left 03-29-2001  . VULVECTOMY  05/27/2012   Procedure: WIDE EXCISION VULVECTOMY;  Surgeon: Imagene Gurney A. Alycia Rossetti, MD;  Location: [facility];  Service: Gynecology;  Laterality: N/A;  Wide Local Excision of Vulva   . VULVECTOMY N/A 11/23/2012   Procedure: WIDE LOCAL EXCISION OF THE VULVA;  Surgeon: Imagene Gurney A. Alycia Rossetti, MD;  Location: [facility];  Service: Gynecology;  Laterality: N/A;  left inferior vulvar biopsy excision left superior lesion left inferior vulvar excision  . VULVECTOMY N/A 08/02/2013   Procedure: WIDE LOCAL  EXCISION VULVA;  Surgeon: Imagene Gurney A. Alycia Rossetti, MD;  Location: [facility];  Service: Gynecology;  Laterality: N/A;  . VULVECTOMY Left 03/29/2014   Procedure: WIDE LOCAL EXCISION OF LEFT VULVA ;  Surgeon: Imagene Gurney A. Alycia Rossetti, MD;  Location: [facility];  Service: Gynecology;  Laterality: Left;  Marland Kitchen VULVECTOMY Bilateral 01/24/2015   Procedure: WIDE LOCAL EXCISION VULVA;  Surgeon: [name], MD;  Location: [facility];  Service: Gynecology;  Laterality: Bilateral;  . VULVECTOMY  PARTIAL  07-16-2010    Current Outpatient Medications  Medication Sig Dispense Refill  . albuterol (PROVENTIL HFA;VENTOLIN HFA) 108 (90 BASE) MCG/ACT inhaler Inhale 2 puffs into the lungs every 4 (four) hours as needed for wheezing or shortness of breath. Reported on 08/01/2015    . aspirin EC 81 MG EC tablet Take 1 tablet (81 mg total) daily by mouth. 30 tablet 0  . atorvastatin (LIPITOR) 40 MG tablet Take 1 tablet (40 mg total) daily at 6 PM by mouth. 30 tablet 0  . baclofen (LIORESAL) 10 MG tablet Take 1 tablet (10 mg total) by mouth 2 (two) times daily. 180 tablet 0  . buPROPion (WELLBUTRIN XL) 150 MG 24 hr tablet Take 150 mg by mouth every morning.     . Calcium Carbonate-Vitamin D 600-125 MG-UNIT TABS Take 1 tablet by mouth daily.     . Cholecalciferol (VITAMIN D3) 1000 UNITS CAPS Take 2 capsules by mouth daily.    . citalopram (CELEXA) 40 MG tablet Take 40 mg by mouth every morning.     . clindamycin (CLEOCIN) 150 MG capsule Take 4 tablets 30 minutesprior to dental procedure.    . furosemide (LASIX) 20 MG tablet Take 40 mg by mouth daily.     Marland Kitchen ibandronate (BONIVA) 150 MG tablet Take 150 mg every 30 (thirty)  days by mouth. Take in the morning with a full glass of water, on an empty stomach, and do not take anything else by mouth or lie down for the next 30 min.    Marland Kitchen levothyroxine (SYNTHROID) 175 MCG tablet TAKE 1 TABLET BY MOUTH EVERY DAY    . montelukast (SINGULAIR) 10 MG tablet Take 10 mg daily by mouth.     . Multiple Vitamin (MULTIVITAMIN WITH MINERALS) TABS tablet Take 1 tablet daily by mouth.    . nystatin cream (MYCOSTATIN) Apply 1 application topically 2 (two) times daily. 90 g 3  . Omega-3 1000 MG CAPS Take 2 g daily by mouth.     . pantoprazole (PROTONIX) 40 MG tablet Take 40 mg by mouth every morning.     . polyvinyl alcohol (LIQUIFILM TEARS) 1.4 % ophthalmic solution Place 1 drop into both eyes as needed for dry eyes.     No current facility-administered medications for this  visit.     Social History   Socioeconomic History  . Marital status: Married    Spouse name: Not on file  . Number of children: Not on file  . Years of education: Not on file  . Highest education level: Not on file  Occupational History  . Not on file  Social Needs  . Financial resource strain: Not on file  . Food insecurity:    Worry: Not on file    Inability: Not on file  . Transportation needs:    Medical: Not on file    Non-medical: Not on file  Tobacco Use  . Smoking status: Former Smoker    Packs/day: 2.00    Years: 41.00    Pack years: 82.00    Types: Cigarettes    Last attempt to quit: 06/05/1999    Years since quitting: 18.6  . Smokeless tobacco: Never Used  Substance and Sexual Activity  . Alcohol use: No  . Drug use: No  . Sexual activity: Not on file  Lifestyle  . Physical activity:    Days per week: Not on file    Minutes per session: Not on file  . Stress: Not on file  Relationships  . Social connections:    Talks on phone: Not on file    Gets together: Not on file    Attends religious service: Not on file    Active member of club or organization: Not on file    Attends meetings of clubs or organizations: Not on file    Relationship status: Not on file  . Intimate partner violence:    Fear of current or ex partner: Not on file    Emotionally abused: Not on file    Physically abused: Not on file    Forced sexual activity: Not on file  Other Topics Concern  . Not on file  Social History Narrative  . Not on file    Family History  Problem Relation Age of Onset  . Cancer Father 19       lung and kidney   . Alzheimer's disease Mother   . Heart attack Maternal Grandmother   . Alcohol abuse Brother   . Breast cancer Paternal Aunt   . Breast cancer Cousin   . Breast cancer Cousin       Marti Sleigh, MD 01/05/2018, 12:26 PM

## 2018-03-04 ENCOUNTER — Encounter (INDEPENDENT_AMBULATORY_CARE_PROVIDER_SITE_OTHER): Payer: Self-pay | Admitting: Orthopedic Surgery

## 2018-03-05 NOTE — Telephone Encounter (Signed)
y

## 2018-03-13 ENCOUNTER — Encounter: Payer: Self-pay | Admitting: Gynecologic Oncology

## 2018-03-16 ENCOUNTER — Ambulatory Visit: Payer: Medicare HMO | Admitting: Gynecology

## 2018-03-17 ENCOUNTER — Ambulatory Visit (INDEPENDENT_AMBULATORY_CARE_PROVIDER_SITE_OTHER): Payer: Medicare HMO | Admitting: Orthopedic Surgery

## 2018-03-17 DIAGNOSIS — M19012 Primary osteoarthritis, left shoulder: Secondary | ICD-10-CM | POA: Diagnosis not present

## 2018-03-18 ENCOUNTER — Encounter (INDEPENDENT_AMBULATORY_CARE_PROVIDER_SITE_OTHER): Payer: Self-pay | Admitting: Orthopedic Surgery

## 2018-03-18 ENCOUNTER — Other Ambulatory Visit (INDEPENDENT_AMBULATORY_CARE_PROVIDER_SITE_OTHER): Payer: Self-pay | Admitting: Orthopedic Surgery

## 2018-03-18 NOTE — Telephone Encounter (Signed)
Ok to rf? 

## 2018-03-18 NOTE — Progress Notes (Signed)
Office Visit Note   Patient: [name]           Date of Birth: 01-15-1946           MRN: [id number] Visit Date: 03/17/2018 Requested by: Chesley Noon, MD [place], [place] [address] PCP: Chesley Noon, MD  Subjective: Chief Complaint  Patient presents with  . Left Shoulder - Pain    HPI: Patient presents with left shoulder pain.  She had an injection into the [facility] humeral joint in March 2019.  That is giving her good relief.  She reports continued pain in that left shoulder and diminishing functional ability.  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  She is also had bilateral knee replacements and a rocky course with the left knee which was infected and has had multiple surgeries.  This was done elsewhere.              ROS: All systems reviewed are negative as they relate to the chief complaint within the history of present illness.  Patient denies  fevers or chills.   Assessment & Plan: Visit Diagnoses:  1. Arthritis of left shoulder region     Plan: Impression is left shoulder arthritis.  Plan is glenohumeral joint injection today.  I think that can help her pain but not necessarily her function.  She also has a component of right shoulder arthritis.  These injections will give her temporary relief but she may need to consider shoulder replacement in the future if her function diminishes more.  Follow-Up Instructions: Return if symptoms worsen or fail to improve.   Orders:  No orders of the defined types were placed in this encounter.  No orders of the defined types were placed in this encounter.     Procedures: No procedures performed   Clinical Data: No additional findings.  Objective: Vital Signs: There were no vitals taken for this visit.  Physical Exam:   Constitutional: Patient appears well-developed HEENT:  Head: Normocephalic Eyes:EOM are normal Neck: Normal range of motion Cardiovascular: Normal  rate Pulmonary/chest: Effort normal Neurologic: Patient is alert Skin: Skin is warm Psychiatric: Patient has normal mood and affect    Ortho Exam: Ortho exam demonstrates painful range of motion of that left shoulder.  Active forward flexion abduction is about 60 degrees.  Cuff strength is pretty reasonable but external rotation is limited to about 20 degrees on the left-hand side.  No masses lymph adenopathy or skin changes noted in the shoulder girdle region.  Specialty Comments:  No specialty comments available.  Imaging: No results found.   PMFS History: Patient Active Problem List   Diagnosis Date Noted  . Shortness of breath   . ACS (acute coronary syndrome) ([place]) 04/11/2017  . Chest pain, rule out acute myocardial infarction 04/10/2017  . Bigeminy 04/10/2017  . Chronic cystitis 04/09/2017  . Thrombocytopenic disorder ([place]) 02/25/2017  . Osteoporosis 12/15/2016  . 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 Incomplete emptying of bladder 07/30/2016  . Left foot pain 12/11/2015  . 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 Stopped smoking with greater than 40 pack year history 10/23/2015  . 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 Mixed incontinence urge and stress 09/16/2011  . Cancer of right breast, stage 2 ([place]) 05/28/2011  . 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 Hypothyroid 05/28/2011  . GERD (gastroesophageal reflux disease) 05/28/2011  . Fibromyalgia 05/28/2011  .  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 Mild depression ([place]) 05/28/2011  . Thrombocytopenia, unspecified ([place]) 05/28/2011  . Morbid obesity ([place]) 04/10/2011  . GERD 11/09/2009  . ALLERGIC RHINITIS 08/23/2007  . ASTHMA 08/23/2007  . COUGH 08/23/2007  . SKIN CANCER, HX OF 08/23/2007   Past Medical History:  Diagnosis Date  . Anxiety   . Arthritis   . Asthma   . Chronic constipation   . Depression   .  Fibromyalgia   . GERD (gastroesophageal reflux disease)   . 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 History of colon polyps    2007 hyperplagia  . History of esophageal dilatation    2008  . History of peptic ulcer   . History of small bowel obstruction    2012  . Hypothyroidism   . Macular degeneration of right eye   . Numbness of left foot    4 toes  . Paget's disease of vulva   . Personal history of chemotherapy 1997  . Personal history of radiation therapy 2005  . PONV (postoperative nausea and vomiting)    and HARD TO WAKE  . Seasonal allergies   . SUI (stress urinary incontinence, female)   . Wears glasses     Family History  Problem Relation Age of Onset  . Cancer Father 73       lung and kidney   . Alzheimer's disease Mother   . Heart attack Maternal Grandmother   . Alcohol abuse Brother   . Breast cancer Paternal Aunt   . Breast cancer Cousin   . Breast cancer Cousin     Past Surgical History:  Procedure Laterality Date  . BLADDER SUSPENSION  10/13/2011   Procedure: TRANSVAGINAL TAPE (TVT) PROCEDURE;  Surgeon: Delice Lesch, MD;  Location: [facility];  Service: Gynecology;  Laterality: N/A;  . BREAST BIOPSY Left 03/19/2004  . BREAST LUMPECTOMY Left 03-27-2004  . CARDIAC CATHETERIZATION  10-30-2000  dr Wynonia Lawman   normal coronary arteries and LVF/  ef 65-70%  . CLOSED MANIPULATION  W/ OPEN REDUCTION EXCHANGE TIBIAL FEMORAL BEARING POST TOTAL LEFT KNEE  04-14-2001  . CYSTOSCOPY  10/13/2011   Procedure: CYSTOSCOPY;  Surgeon: Delice Lesch, MD;  Location: [facility];  Service: Gynecology;  Laterality: Bilateral;  . DILATION AND CURETTAGE OF UTERUS  1968  . FOOT SURGERY Left 02-01-2009   TRIPLE ARTHRODESIS  . FOOT SURGERY Left 11/14   I&D left foot with woundvac placement  . INCISIONAL HERNIA REPAIR  05/05/2011   Procedure:  LAPAROSCOPIC INCISIONAL HERNIA;  Surgeon: Edward Jolly, MD;  Location: [facility];  Service: General;  Laterality: N/A;  LAPAROSCOPIC REPAIR INCARCERATED INCISIONAL HERNIA  WITH MESH  . KNEE ARTHROSCOPY Bilateral left [phone] & 04-1992/   right [id number]  . LAPAROSCOPIC CHOLECYSTECTOMY  06-21-2010  . LEFT HEART CATH AND CORONARY ANGIOGRAPHY N/A 04/13/2017   Procedure: LEFT HEART CATH AND CORONARY ANGIOGRAPHY;  Surgeon: Jettie Booze, MD;  Location: [facility];  Service: Cardiovascular;  Laterality: N/A;  . LUMBAR SPINE SURGERY  1998   L5 -- S1  . MASTECTOMY Right 1997   W/  LYMPH NODE DISSECTION AND RECONSTRUCTION WITH IMPLANTS AND EXPANDER  . NEGATIVE SLEEP STUDY  12/26/2015  . PORT-A-CATH PLACEMENT AND REMOVAL  1997  . REMOVAL RIGHT BREAST IMPLANT  AND CAPSULECTOMY  06-03-1999  . REVISION TOTAL KNEE ARTHROPLASTY Left 07-07-2005  &  12-10-2001   12-10-2001  REMOVAL TOTAL KNEE/ I&D / INSERTION ANTIBIOTIC SPACER  . REVISION TOTAL KNEE ARTHROPLASTY Right 07-07-2005  . ROTATOR CUFF REPAIR Right   . TENDON REPAIR RIGHT RING FINGER  2011  . THUMB TRIGGER RELEASE Right 1993  . THYROIDECTOMY, PARTIAL  1983  . TONSILLECTOMY  1968  . TOTAL KNEE ARTHROPLASTY Bilateral right 05-04-2000/  left 03-29-2001  . VULVECTOMY  05/27/2012   Procedure: WIDE EXCISION VULVECTOMY;  Surgeon: Imagene Gurney A. Alycia Rossetti, MD;  Location: [facility];  Service: Gynecology;  Laterality: N/A;  Wide Local Excision of Vulva   . VULVECTOMY N/A 11/23/2012   Procedure: WIDE LOCAL EXCISION OF THE VULVA;  Surgeon: Imagene Gurney A. Alycia Rossetti, MD;  Location: [facility];  Service: Gynecology;  Laterality: N/A;  left inferior vulvar biopsy excision left superior lesion left inferior vulvar excision  . VULVECTOMY N/A 08/02/2013   Procedure: WIDE LOCAL  EXCISION VULVA;  Surgeon: Imagene Gurney A. Alycia Rossetti, MD;  Location[facility];  Service: Gynecology;  Laterality: N/A;  . VULVECTOMY Left 03/29/2014   Procedure: WIDE LOCAL EXCISION OF LEFT VULVA ;  Surgeon: Imagene Gurney A.  Alycia Rossetti, MD;  Location: [facility];  Service: Gynecology;  Laterality: Left;  Marland Kitchen VULVECTOMY Bilateral 01/24/2015   Procedure: WIDE LOCAL EXCISION VULVA;  Surgeon: [name], MD;  Location: [facility];  Service: Gynecology;  Laterality: Bilateral;  . VULVECTOMY PARTIAL  07-16-2010   Social History   Occupational History  . Not on file  Tobacco Use  . Smoking status: Former Smoker    Packs/day: 2.00    Years: 41.00    Pack years: 82.00    Types: Cigarettes    Last attempt to quit: 06/05/1999    Years since quitting: 18.7  . Smokeless tobacco: Never Used  Substance and Sexual Activity  . Alcohol use: No  . Drug use: No  . Sexual activity: Not on file

## 2018-03-18 NOTE — Telephone Encounter (Signed)
y

## 2018-03-26 ENCOUNTER — Ambulatory Visit (INDEPENDENT_AMBULATORY_CARE_PROVIDER_SITE_OTHER): Payer: Medicare HMO

## 2018-03-26 ENCOUNTER — Ambulatory Visit (INDEPENDENT_AMBULATORY_CARE_PROVIDER_SITE_OTHER): Payer: Medicare HMO | Admitting: Orthopedic Surgery

## 2018-03-26 ENCOUNTER — Encounter (INDEPENDENT_AMBULATORY_CARE_PROVIDER_SITE_OTHER): Payer: Self-pay | Admitting: Orthopedic Surgery

## 2018-03-26 DIAGNOSIS — M25562 Pain in left knee: Secondary | ICD-10-CM | POA: Diagnosis not present

## 2018-03-26 NOTE — Progress Notes (Signed)
Office Visit Note   Patient: [name]           Date of Birth: [birth year]           MRN: [id number] Visit Date: 03/26/2018 Requested by: Chesley Noon, MD [place], [place] [address] PCP: Chesley Noon, MD  Subjective: Chief Complaint  Patient presents with  . Left Knee - Pain    HPI: [name] is a patient with left knee pain.'s been relatively acute this week with no history of injury.  Localizes the pain discretely to the lateral side of the knee.  This left knee has had multiple revisions for infection.  She is not having any fevers or chills and no redness.  She describes weakness and giving way.  Pain radiates down to the mid lower lateral calf.  She is using a walker.  Denies any back pain or radicular symptoms.  Takes ibuprofen for her symptoms.              ROS: All systems reviewed are negative as they relate to the chief complaint within the history of present illness.  Patient denies  fevers or chills.   Assessment & Plan: Visit Diagnoses:  1. Acute pain of left knee     Plan: Impression is acute pain in that left knee with no evidence of infection loosening or fracture.  Hard to say exactly where the issue is with this knee but I do not see anything overtly catastrophic.  I think the best plan at this time would be observation.  If her symptoms worsen this may declare itself more clearly clinically in the future.  Discussed with her the warning signs of redness fever infection or inability to weight-bear.  Radiographs look unchanged from a year ago.  Follow-Up Instructions: Return if symptoms worsen or fail to improve.   Orders:  Orders Placed This Encounter  Procedures  . XR Knee 1-2 Views Left   No orders of the defined types were placed in this encounter.     Procedures: No procedures performed   Clinical Data: No additional findings.  Objective: Vital Signs: There were no vitals taken for this visit.  Physical Exam:    Constitutional: Patient appears well-developed HEENT:  Head: Normocephalic Eyes:EOM are normal Neck: Normal range of motion Cardiovascular: Normal rate Pulmonary/chest: Effort normal Neurologic: Patient is alert Skin: Skin is warm Psychiatric: Patient has normal mood and affect    Ortho Exam: Ortho exam demonstrates full active and passive range of motion of the hip with no groin pain internal and external rotation of the left leg.  There is no warmth cellulitis or effusion in the left knee.  Extensor mechanism is intact.  No focal tenderness swelling or bruising noted on the lateral side of the leg.  No other masses lymph adenopathy or skin changes noted in that leg region.  Left ankle is fused but the foot is perfused.  No warmth to the left knee compared to the right  Specialty Comments:  No specialty comments available.  Imaging: Xr Knee 1-2 Views Left  Result Date: 03/26/2018 AP lateral left knee reviewed.  Revision knee prosthesis on the tibial side in good position and alignment with no complicating features.  No evidence of loosening or any change from radiographs done a year ago.  No new fracture.  Revision prosthesis on the femoral side also without complicating features.    PMFS History: Patient Active Problem List   Diagnosis Date Noted  .  Shortness of breath   . ACS (acute coronary syndrome) ([place]) 04/11/2017  . Chest pain, rule out acute myocardial infarction 04/10/2017  . Bigeminy 04/10/2017  . Chronic cystitis 04/09/2017  . Thrombocytopenic disorder ([place]) 02/25/2017  . Osteoporosis 12/15/2016  . 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 Incomplete emptying of bladder 07/30/2016  . Left foot pain 12/11/2015  . 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 Stopped smoking with greater than 40 pack year history 10/23/2015  . 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 Mixed incontinence urge and stress 09/16/2011  . Cancer  of right breast, stage 2 ([place]) 05/28/2011  . 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 Hypothyroid 05/28/2011  . GERD (gastroesophageal reflux disease) 05/28/2011  . Fibromyalgia 05/28/2011  . 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 Mild depression ([place]) 05/28/2011  . Thrombocytopenia, unspecified ([place]) 05/28/2011  . Morbid obesity ([place]) 04/10/2011  . GERD 11/09/2009  . ALLERGIC RHINITIS 08/23/2007  . ASTHMA 08/23/2007  . COUGH 08/23/2007  . SKIN CANCER, HX OF 08/23/2007   Past Medical History:  Diagnosis Date  . Anxiety   . Arthritis   . Asthma   . Chronic constipation   . Depression   . Fibromyalgia   . GERD (gastroesophageal reflux disease)   . 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 History of colon polyps    2007 hyperplagia  . History of esophageal dilatation    2008  . History of peptic ulcer   . History of small bowel obstruction    2012  . Hypothyroidism   . Macular degeneration of right eye   . Numbness of left foot    4 toes  . Paget's disease of vulva   . Personal history of chemotherapy 1997  . Personal history of radiation therapy 2005  . PONV (postoperative nausea and vomiting)    and HARD TO WAKE  . Seasonal allergies   . SUI (stress urinary incontinence, female)   . Wears glasses     Family History  Problem Relation Age of Onset  . Cancer Father 86       lung and kidney   . Alzheimer's disease Mother   . Heart attack Maternal Grandmother   . Alcohol abuse Brother   . Breast cancer Paternal Aunt   . Breast cancer Cousin   . Breast cancer Cousin     Past Surgical History:  Procedure Laterality Date  . BLADDER SUSPENSION  10/13/2011   Procedure: TRANSVAGINAL TAPE (TVT) PROCEDURE;  Surgeon: Delice Lesch, MD;  Location: [facility];  Service: Gynecology;  Laterality:  N/A;  . BREAST BIOPSY Left 03/19/2004  . BREAST LUMPECTOMY Left 03-27-2004  . CARDIAC CATHETERIZATION  10-30-2000  dr Wynonia Lawman   normal coronary arteries and LVF/  ef 65-70%  . CLOSED MANIPULATION  W/ OPEN REDUCTION EXCHANGE TIBIAL FEMORAL BEARING POST TOTAL LEFT KNEE  04-14-2001  . CYSTOSCOPY  10/13/2011   Procedure: CYSTOSCOPY;  Surgeon: Delice Lesch, MD;  Location: [facility];  Service: Gynecology;  Laterality: Bilateral;  . DILATION AND CURETTAGE OF UTERUS  1968  . FOOT SURGERY Left 02-01-2009   TRIPLE ARTHRODESIS  . FOOT SURGERY Left 11/14   I&D left foot with woundvac placement  . [place] REPAIR  05/05/2011  Procedure: LAPAROSCOPIC INCISIONAL HERNIA;  Surgeon: Edward Jolly, MD;  Location[facility];  Service: General;  Laterality: N/A;  LAPAROSCOPIC REPAIR INCARCERATED INCISIONAL HERNIA  WITH MESH  . KNEE ARTHROSCOPY Bilateral left [phone] & 04-1992/   right [phone]  . LAPAROSCOPIC CHOLECYSTECTOMY  06-21-2010  . LEFT HEART CATH AND CORONARY ANGIOGRAPHY N/A 04/13/2017   Procedure: LEFT HEART CATH AND CORONARY ANGIOGRAPHY;  Surgeon: Jettie Booze, MD;  Location: [facility];  Service: Cardiovascular;  Laterality: N/A;  . LUMBAR SPINE SURGERY  1998   L5 -- S1  . MASTECTOMY Right 1997   W/  LYMPH NODE DISSECTION AND RECONSTRUCTION WITH IMPLANTS AND EXPANDER  . NEGATIVE SLEEP STUDY  12/26/2015  . PORT-A-CATH PLACEMENT AND REMOVAL  1997  . REMOVAL RIGHT BREAST IMPLANT AND CAPSULECTOMY  06-03-1999  . REVISION TOTAL KNEE ARTHROPLASTY Left 07-07-2005  &  12-10-2001   12-10-2001  REMOVAL TOTAL KNEE/ I&D / INSERTION ANTIBIOTIC SPACER  . REVISION TOTAL KNEE ARTHROPLASTY Right 07-07-2005  . ROTATOR CUFF REPAIR Right   . TENDON REPAIR RIGHT RING FINGER  2011  . THUMB TRIGGER RELEASE Right 1993  . THYROIDECTOMY, PARTIAL  1983  . TONSILLECTOMY  1968  . TOTAL KNEE ARTHROPLASTY Bilateral right 05-04-2000/  left 03-29-2001  . VULVECTOMY  05/27/2012   Procedure: WIDE  EXCISION VULVECTOMY;  Surgeon: Imagene Gurney A. Alycia Rossetti, MD;  Location: [facility];  Service: Gynecology;  Laterality: N/A;  Wide Local Excision of Vulva   . VULVECTOMY N/A 11/23/2012   Procedure: WIDE LOCAL EXCISION OF THE VULVA;  Surgeon: Imagene Gurney A. Alycia Rossetti, MD;  Location: [facility];  Service: Gynecology;  Laterality: N/A;  left inferior vulvar biopsy excision left superior lesion left inferior vulvar excision  . VULVECTOMY N/A 08/02/2013   Procedure: WIDE LOCAL  EXCISION VULVA;  Surgeon: Imagene Gurney A. Alycia Rossetti, MD;  Location: [facility];  Service: Gynecology;  Laterality: N/A;  . VULVECTOMY Left 03/29/2014   Procedure: WIDE LOCAL EXCISION OF LEFT VULVA ;  Surgeon: Imagene Gurney A. Alycia Rossetti, MD;  Location[facility];  Service: Gynecology;  Laterality: Left;  Marland Kitchen VULVECTOMY Bilateral 01/24/2015   Procedure: WIDE LOCAL EXCISION VULVA;  Surgeon: [name], MD;  Location: [facility];  Service: Gynecology;  Laterality: Bilateral;  . VULVECTOMY PARTIAL  07-16-2010   Social History   Occupational History  . Not on file  Tobacco Use  . Smoking status: Former Smoker    Packs/day: 2.00    Years: 41.00    Pack years: 82.00    Types: Cigarettes    Last attempt to quit: 06/05/1999    Years since quitting: 18.8  . Smokeless tobacco: Never Used  Substance and Sexual Activity  . Alcohol use: No  . Drug use: No  . Sexual activity: Not on file

## 2018-04-05 ENCOUNTER — Other Ambulatory Visit: Payer: Self-pay | Admitting: General Surgery

## 2018-04-05 ENCOUNTER — Ambulatory Visit
Admission: RE | Admit: 2018-04-05 | Discharge: 2018-04-05 | Disposition: A | Discharge status: Home / Self Care | Payer: Medicare HMO | Source: Ambulatory Visit | Attending: General Surgery | Admitting: General Surgery

## 2018-04-05 DIAGNOSIS — G8918 Other acute postprocedural pain: Secondary | ICD-10-CM

## 2018-04-05 DIAGNOSIS — R0789 Other chest pain: Principal | ICD-10-CM

## 2018-04-20 ENCOUNTER — Inpatient Hospital Stay: Payer: Medicare HMO | Admitting: Gynecology

## 2018-04-29 ENCOUNTER — Encounter: Payer: Self-pay | Admitting: Podiatry

## 2018-04-29 ENCOUNTER — Ambulatory Visit: Payer: Medicare HMO | Admitting: Podiatry

## 2018-04-29 DIAGNOSIS — L84 Corns and callosities: Secondary | ICD-10-CM

## 2018-04-29 DIAGNOSIS — M779 Enthesopathy, unspecified: Secondary | ICD-10-CM | POA: Diagnosis not present

## 2018-04-29 MED ORDER — TRIAMCINOLONE ACETONIDE 10 MG/ML IJ SUSP
10.0000 mg | Freq: Once | INTRAMUSCULAR | Status: AC
  Administered 2018-04-29: 10 mg

## 2018-04-29 NOTE — Progress Notes (Signed)
Subjective:   Patient ID: [name], female   DOB: 72 y.o.   MRN: [id number]   HPI Patient presents stating she has inflammation base of fifth metatarsal left with keratotic lesion and it did pretty well for 3 months   ROS      Objective:  Physical Exam  Neurovascular status intact with patient found to have inflammation base of fifth MPJ left with fluid buildup and keratotic lesion sub-fifth metatarsal left with painful     Assessment:  Capsulitis of the fifth metatarsal base left with fluid buildup along with keratotic lesion with fixed arthritis subtalar ankle joint left creating increased pressure     Plan:  H&P sterile prep administered to the area and injected 3 mg Dexasone Kenalog 5 mg Xylocaine and debrided the area fully with no iatrogenic bleeding and reappoint as needed

## 2018-05-24 ENCOUNTER — Ambulatory Visit: Payer: Medicare HMO | Admitting: Podiatry

## 2018-05-24 DIAGNOSIS — M2041 Other hammer toe(s) (acquired), right foot: Secondary | ICD-10-CM

## 2018-05-24 DIAGNOSIS — L603 Nail dystrophy: Secondary | ICD-10-CM

## 2018-05-26 DIAGNOSIS — I499 Cardiac arrhythmia, unspecified: Secondary | ICD-10-CM

## 2018-05-26 HISTORY — DX: Cardiac arrhythmia, unspecified: I49.9

## 2018-05-26 NOTE — Progress Notes (Signed)
   HPI: 73 year old female presenting today with a chief complaint of a painful right 2nd toenail that began about 3 weeks ago. She has not done anything for treatment and denies any modifying factors. Patient is here for further evaluation and treatment.   Past Medical History:  Diagnosis Date  . Anxiety   . Arthritis   . Asthma   . Chronic constipation   . Depression   . Fibromyalgia   . GERD (gastroesophageal reflux disease)   . 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 History of colon polyps    2007 hyperplagia  . History of esophageal dilatation    2008  . History of peptic ulcer   . History of small bowel obstruction    2012  . Hypothyroidism   . Macular degeneration of right eye   . Numbness of left foot    4 toes  . Paget's disease of vulva   . Personal history of chemotherapy 1997  . Personal history of radiation therapy 2005  . PONV (postoperative nausea and vomiting)    and HARD TO WAKE  . Seasonal allergies   . SUI (stress urinary incontinence, female)   . Wears glasses       Objective: Physical Exam General: The patient is alert and oriented x3 in no acute distress.  Dermatology: Hyperkeratotic, discolored, thickened, onychodystrophy of the right 2nd toenail. Skin is cool, dry and supple bilateral lower extremities. Negative for open lesions or macerations.  Vascular: Palpable pedal pulses bilaterally. No edema or erythema noted. Capillary refill within normal limits.  Neurological: Epicritic and protective threshold grossly intact bilaterally.   Musculoskeletal Exam: All pedal and ankle joints range of motion within normal limits bilateral. Muscle strength 5/5 in all groups bilateral. Hammertoe contracture deformity noted to digits 2-5 of the right foot.    Assessment: 1. Dy strophic nail right 2nd toe 2.  Hammertoes right foot digits 2-5   Plan of Care:  1. Patient evaluated.  2. Mechanical debridement of the right 2nd toenail performed using a nail nipper. Filed with dremel without incident.  3. Recommended good shoe gear.  4. Return to clinic as needed.     Edrick Kins, DPM [facility]  Dr. Edrick Kins, DPM    [address], [place] [address]                Office [phone]  Fax [phone]

## 2018-06-08 ENCOUNTER — Inpatient Hospital Stay: Payer: Medicare HMO | Attending: Gynecology | Admitting: Gynecology

## 2018-06-08 ENCOUNTER — Encounter: Payer: Self-pay | Admitting: Gynecology

## 2018-06-08 VITALS — BP 123/57 | HR 82 | Temp 98.2°F | Resp 18 | Ht 65.0 in | Wt 247.0 lb

## 2018-06-08 DIAGNOSIS — Z9889 Other specified postprocedural states: Secondary | ICD-10-CM | POA: Diagnosis not present

## 2018-06-08 DIAGNOSIS — C519 Malignant neoplasm of vulva, unspecified: Secondary | ICD-10-CM | POA: Insufficient documentation

## 2018-06-08 DIAGNOSIS — C510 Malignant neoplasm of labium majus: Secondary | ICD-10-CM | POA: Diagnosis not present

## 2018-06-08 DIAGNOSIS — C4499 Other specified malignant neoplasm of skin, unspecified: Secondary | ICD-10-CM

## 2018-06-08 DIAGNOSIS — Z87891 Personal history of nicotine dependence: Secondary | ICD-10-CM | POA: Insufficient documentation

## 2018-06-08 NOTE — Patient Instructions (Signed)
Plan to follow up in six months or sooner if needed. Please call our office closer to the date to schedule.

## 2018-06-08 NOTE — Progress Notes (Signed)
Consult Note: Gyn-Onc   [name] 73 y.o. female  Chief Complaint  Patient presents with  . Paget's disease of vulva    Assessment : Long-standing history of Paget's disease of the vulva. Examination of the vulva shows no evidence of Paget's disease.    Plan:   The patient will return to see me in 3 months.  She will contact us if she develops any new symptoms.  Interval History:  Patient returns today as previously scheduled.  Her last visit she is done well.  She denies any vulvar symptoms.  She has no bleeding spotting or discharge.  She recently had esophageal dilatation for stricture and feels much better. HPI:  Patient is a 73 year old with a history of a partial simple vulvectomy in February of 2012 for vulvar Paget's disease. Some of the surgical margins were positive. Her postoperative course was uncomplicated. She was last seen by our service in 12/14. At that time there was no evidence of any lesions consistent with Paget's. However, there is considerable amount of excoriations throughout the entire vulvar and medial thighs consistent with chronic vulvitis.In December 2013 she had a hernia repair due to bowel obstruction by Dr. Excell Seltzer with permanent mesh. In May 2013 she also had a TVT bladder by Dr. Everett Graff. She does continue to wear a pad and also takes Detrol twice daily but states that her urinary leakage is markedly improved. She described the vulvar irritation is intermittent itching with burning and that it has increased over time as has the pruritus. She does use a steroid cream twice daily and does scratch her vulva.   She underwent a wide local excision of the left vulva on January 2 of year 2014. Operative findings included 2 separate 3 x 3 and 1.2 cm lesions consistent with Paget's on the left.  Pathology revealed 2 foci of extramammary Paget's disease extending to the left inked margin in the right and anterior tip margins at multiple foci. The posterior  margin was negative for malignancy.   I performed a biopsy of this it was consistent with recurrent Paget's disease. On 11/23/2012 showed a repeat wide local excision of vulva x2 with the posterior fourchette biopsy. Findings revealed along the left labia minora/vagina is a Paget's disease measuring approximately 2.5 x 1 cm in the superior aspect of her prior left vulvar excision. The inferior aspect of a similar lesion. 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 On the left perineal body the area is somewhat excoriated pale. Biopsy was performed in this area.  Pathology revealed:  1. Vulva, biopsy, left inferior - EXTRAMAMMARY PAGET DISEASE EXTENDING TO THE EDGES OF THE BIOPSY. 2. Vulva, excision, left superior lesion suture marks 1200 - EXTRAMAMMARY PAGET DISEASE. - MARGINS NOT INVOLVED. - CLOSEST MARGIN 9 O'CLOCK, LESS THAN 0.1 CM. 3. Vulva, excision, left inferior excision suture marks 1200 - EXTRAMAMMARY PAGET DISEASE, 12 O'CLOCK MARGIN FOCALLY INVOLVED. - REMAINING MARGINS CLEAR.  We have discussed repeating surgery however she had multiple orthopedic issues had broken ankle. She was seen by [name] in December 2014. Exam at that time was fairly unremarkable.  I had seen her in January of 2015. At that time exam was concerning for Paget's.   On 08/02/2013 she underwent wide local excisions of the vulva x2.  Operative findings included pale pink raised lesion measuring 1 x 0.5 cm the left upper vulva near the prior excision site. There is a pale raised lesion measuring 1.5  x 1 cm on the left crossing midline perineal body.  Pathology revealed left superior vulvar excision with extramammary Paget's disease extending to the 9:00 margin. The other margins were negative for tumor. Of the left of midline lesion she extramammary Paget's disease extending to the 12:00 margin and focally at the 6:00 margin but otherwise negative margins. The patient at that  time discussed with [place] that she was having some right lower quadrant pain. Her exam is very difficult secondary to habitus. There ultrasound was obtained. Ultrasound revealed:  FINDINGS:  Uterus Measurements: 4.6 x 3.5 x 3.8 cm. 2.5 x 1.7 x 1.6 cm uterine fibroid. This is present in the right lateral aspect of the body uterus.  Endometrium Thickness: 6.1 mm. No focal abnormality visualized.  Right ovary: No focal abnormality.  Left ovary :No focal abnormality.  Other findings: No free fluid  IMPRESSION:  Uterine fibroid otherwise negative exam.  I saw her March 16, 2014 at which time there was an area on the left vulva with hyperkeratosis. A biopsy of that area was performed consistent with Paget's disease. On March 29, 2014 she underwent wide local excision of the vulva. On exam she had a 1 cm left vulvar lesion just below the left labia minora. She underwent a wide local excision. He received a 3.7 x 1.7 x 0.3 cm lesion. There was extramammary Paget's disease extending to the 6 to 9:00 margin in the 12:00 margin. This is despite grossly negative margins on physical examination.   She had an excision in November that revealed Diagnosis Vulva, excision, w/stitch @ 1200 - EXTRAMAMMARY PAGET'S DISEASE, EXTENDING TO THE 6-9 O'CLOCK MARGIN AND 12 O'CLOCK PERPENDICULAR MARGIN.  I saw her in February 2016 at which time there was concern for recurrent disease. She decided to proceed with Aldara therapy. She used for approximate 4 weeks and then decided to stop it secondary to the expense of the medication. She then started using the clobetasol. While she was using the Aldara all the pruritus in her vulva resolved itself. Since she stopped it and was using the steroid cream the pruritus returned.  I saw her in May 2016 at which time she has small area consistent with Paget's on the right labia minora and a larger patch on the left. Biopsy on the right revealed extramammary Paget's. After  discussion she wished to try the Aldara again. She called and requested an earlier appointment as for the last 2-3 weeks had increased itching and has been scratching so much that she's had some bleeding. I last saw her on July 6. At that time she had been having a lot of symptoms of pruritus and scratching a great deal. Vulvar examination at that time revealed a fairly macerated vulva and it was possible to discern what could be related to Paget's, effect of the Aldara therapy, trauma from scratching. She was asked to stop the Aldara therapy and use a low-dose daily cream. We asked her to refrain from scratching come in today for evaluation.  I saw her again December 26, 2104 at which time there was evidence clearly a Paget's disease and she scheduled for surgery. On January 24, 2015 she underwent bilateral wide local excisions of the vulva. On the right vulva there was a 2 cm patch of Paget's disease in the mid labia majora. On the left side. The remaining labia minora in the area prior to the excision are smaller lesion consistent with Paget's disease.  Diagnosis 1. Vulva, excision, left wide excision -  EXTRAMAMMARY PAGET'S DISEASE EXTENDING TO THE 12 O'CLOCK, 3 O'CLOCK, 6 O'CLOCK AND 9 O'CLOCK MARGINS. 2. Vulva, excision, right wide excision - EXTRAMAMMARY PAGET'S EXTENDING TO THE 3 O'CLOCK MARGIN AND THE 6 O'CLOCK POLE.  Please refer to the photograph in the operative note from Dr. Skeet Latch for complete mapping of her lesion. She still having a little bit of spotting from the biopsy sites and some pain.  The patient initially underwent partial simple vulvectomy in February 2012 for vulvar Paget's disease. Some of her surgical margins were positive. She had an uncomplicated post operative course. She had some evidence of vulvar irritation afterward with symptoms of pruritus and burning that increased over time and for which she was using a steroid cream twice daily. She underwent wide local excision  of the left vulva in January 2014. Findings at the time of surgery were to areas consistent with Paget's on the left, one measuring 3 x 3 cm and one measuring 1.2 cm pathology revealed 2 foci of extramammary Paget's disease extending to the left intact margin in the right and anterior tip margins at multiple foci. The patient was seen for follow-up with recurrent Paget's disease noted. In July 2014, she underwent repeat wide local excision of 2 areas in the posterior fourchette biopsy. Findings were an area along the left labium minora measuring 2.5 x 1 cm at the superior aspect of her prior left vulvar excision. There was a similar lesion at the inferior aspect. It appeared that the patient also had lichen sclerosis of the right vulva. Left perineal body biopsy was performed secondary to some noted excoriation. Pathology from the 2 excisions showed extramammary Paget's disease, margins on left superior lesion were negative, margin at 12:00 on inferior lesion focally involved. Perineal body biopsy positive for Paget's disease.   The patient then underwent several orthopedic surgeries and ultimately was taken back to the operating room in March 2015 for 2 wide local excisions of the vulva. Findings at that time included a pill raised pink lesion measuring 1 x 0.5 cm of the left upper vulva near the prior excision site and a second pill raised lesion measuring 1.5 x 1 cm on the left crossing midline perineal body. Pathology showed that the left superior vulvar excision was Paget's disease extending to the 9:00 margin. Other margins were negative. The left midline lesion also was Paget's disease extending to the 12:00 margin and focally to the 6:00 margin. Patient was then seen back in October 2015 at which time there was an area on the left vulva with hyperkeratosis. A biopsy of that area confirmed Paget's disease. Patient underwent repeat wide local excision of the vulva on March 29, 2014. Findings were a 1 cm  left vulvar lesion just below the left labium minora. Pathology showed Paget's disease extending to the 6, 9, and 12:00 margins. This was despite grossly negative margins on physical exam.  Patient was next seen in February 2016 at which time there was concern for recurrent disease. She decided to proceed with Aldara therapy which she used for 4 weeks and then stopped it secondary to the expense of the medication. Started using clobetasol. Her pruritus stopped with Aldara, but unfortunately returned when she switched to clobetasol. In May 2016 she was seen and had an area that was smaller still consistent with Paget's disease on the right labia minora and a larger patch on the left. Biopsy on the right revealed extramammary Paget's. She opted to try Aldara again. Given evidence of recurrent  disease she underwent bilateral wide local excision of the vulva at the end of August 2016. Both wide local excision specimens were positive for Paget's disease with extension to focal margins on each.  Most recently, the patient was taken to the operating room on 01/31/2016 for preoperative mapping of her lesion for her planned wide local excision of the left vulva and smaller excision on the right. Operative findings at that time were notable for surgically absent inferior aspect of the left labium minora and majora. Multiple areas with white flaky skin changes were biopsied and the abnormal-appearing tissue. Changes noted in the area of the clitoris and perineum and at the introitus just distal to the hymen. Biopsies were collected on all of the sites.   10/17 Findings: 1. Surgical absent left labia majorum inferiorly from prior vulvectomies.  2. Focusing on the left vulva, there was gross disease incorporating the prior surgical site, characterized by patchy white and erythematous epithelium. The affected area spanned from the perineal body to 5-6cm laterally. Prior mapping biopsy sites x3 were well healed and visible  and surrounded by what appeared to be grossly normal appearing skin. Superiorly on the left labia majorum, there was affected epithelium with a similar appearing patchy white, erythematous epithelium.  3. Focusing of the right vulva, there was less extensive disease. Affected area extended from the perineal body to roughly 3cm laterally with minimal involvement of the right labia majorum.  4. The urethra, anus, and clitoris were spared.  5. A rectal exam was performed during the procedure to confirm protection of the sphincter, which was intact at the conclusion of our case.  6. A rectocele was notable 3cm into the vagina.   Pathology: Final Diagnosis  A: Vulva, right and left, vulvectomy - (Recurrent/persisent) Paget disease  - Disease size approximately 8.3 x 4.6 cm  - Disease confined to the epidermis - Multiple epidermal margins are positive for disease: 6 o'clock to 11 o'clock (per original orientation)  - Deep margin free of involvement  In February 2019 the patient underwent wide local excision of a chronic open wound on her left thigh.  Final pathology showed this to be chronic granulation tissue.    Review of Systems:10 point review of systems is negative except as noted in interval history.   Vitals: Blood pressure (!) 123/57, pulse 82, temperature 98.2 F (36.8 C), temperature source Oral, resp. rate 18, height 5\' 5"  (1.651 m), weight 247 lb (112 kg), SpO2 95 %.  Physical Exam: General : The patient is a healthy woman in no acute distress.  HEENT: normocephalic, extraoccular movements normal; neck is supple without thyromegally  Lynphnodes: Supraclavicular and inguinal nodes not enlarged  Abdomen: Soft, non-tender, no ascites, no organomegally, no masses, no hernias  Pelvic:  EGBUS: Significantly distorted by prior partial vulvectomy's and plastic surgery reconstruction.  No gross lesions are noted. This does not appear to have a classic Paget's disease picture at the  present time. Vagina: Normal, no lesions  Urethra and Bladder: Normal, non-tender   Lower extremities: No edema or varicosities. Normal range of motion      Allergies  Allergen Reactions  . Hydromorphone Hcl Itching and Rash    Per CRNA, pt tol Fentanyl IV for OR case, no issues reported to rec RN in PACU, no immediate PACU issues noted as well upon arrival  . Demerol  [Meperidine Hcl] Other (See Comments)    "Knocks me out"  . Other Other (See Comments)    Blisters  .  Codeine Other (See Comments)    hallucinations Hyper/ Hallucinations   . Demerol [Meperidine] Other (See Comments)    "Knocks me out"  . Diazepam Other (See Comments)    Extreme sedation; patient does not recall taking diazepam (12/07/14)  . Tape Hives and Other (See Comments)    "thick clear plastic tape" causes blisters Blisters Blisters  . Macrobid [Nitrofurantoin] Rash  . Morphine And Related Itching and Rash    Per CRNA, pt tol Fentanyl IV for OR case, no issues reported to rec RN in PACU, no immediate PACU issues noted as well upon arrival  . Penicillins Hives and Swelling  . Valisone [Betamethasone] Rash    Past Medical History:  Diagnosis Date  . Anxiety   . Arthritis   . Asthma   . Chronic constipation   . Depression   . Fibromyalgia   . GERD (gastroesophageal reflux disease)   . 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 History of colon polyps    2007 hyperplagia  . History of esophageal dilatation    2008  . History of peptic ulcer   . History of small bowel obstruction    2012  . Hypothyroidism   . Macular degeneration of right eye   . Numbness of left foot    4 toes  . Paget's disease of vulva   . Personal history of chemotherapy 1997  . Personal history of radiation therapy 2005  . PONV (postoperative nausea and vomiting)    and HARD TO  WAKE  . Seasonal allergies   . SUI (stress urinary incontinence, female)   . Wears glasses     Past Surgical History:  Procedure Laterality Date  . BLADDER SUSPENSION  10/13/2011   Procedure: TRANSVAGINAL TAPE (TVT) PROCEDURE;  Surgeon: Delice Lesch, MD;  Location: [facility];  Service: Gynecology;  Laterality: N/A;  . BREAST BIOPSY Left 03/19/2004  . BREAST LUMPECTOMY Left 03-27-2004  . CARDIAC CATHETERIZATION  10-30-2000  dr Wynonia Lawman   normal coronary arteries and LVF/  ef 65-70%  . CLOSED MANIPULATION  W/ OPEN REDUCTION EXCHANGE TIBIAL FEMORAL BEARING POST TOTAL LEFT KNEE  04-14-2001  . CYSTOSCOPY  10/13/2011   Procedure: CYSTOSCOPY;  Surgeon: Delice Lesch, MD;  Location: [facility];  Service: Gynecology;  Laterality: Bilateral;  . DILATION AND CURETTAGE OF UTERUS  1968  . FOOT SURGERY Left 02-01-2009   TRIPLE ARTHRODESIS  . FOOT SURGERY Left 11/14   I&D left foot with woundvac placement  . INCISIONAL HERNIA REPAIR  05/05/2011   Procedure: LAPAROSCOPIC INCISIONAL HERNIA;  Surgeon: Edward Jolly, MD;  Location: [facility];  Service: General;  Laterality: N/A;  LAPAROSCOPIC REPAIR INCARCERATED INCISIONAL HERNIA  WITH MESH  . KNEE ARTHROSCOPY Bilateral left [id number] & 04-1992/   right [phone]  . LAPAROSCOPIC CHOLECYSTECTOMY  06-21-2010  . LEFT HEART CATH AND CORONARY ANGIOGRAPHY N/A 04/13/2017   Procedure: LEFT HEART CATH AND CORONARY ANGIOGRAPHY;  Surgeon: Jettie Booze, MD;  Location: [facility];  Service: Cardiovascular;  Laterality: N/A;  . LUMBAR SPINE SURGERY  1998   L5 -- S1  . MASTECTOMY Right 1997   W/  LYMPH NODE DISSECTION AND RECONSTRUCTION WITH IMPLANTS AND EXPANDER  . NEGATIVE SLEEP STUDY  12/26/2015  . PORT-A-CATH PLACEMENT AND REMOVAL  1997  . REMOVAL RIGHT BREAST IMPLANT  AND CAPSULECTOMY  06-03-1999  . REVISION TOTAL KNEE ARTHROPLASTY Left 07-07-2005  &  12-10-2001   12-10-2001  REMOVAL TOTAL KNEE/ I&D / INSERTION ANTIBIOTIC SPACER  . REVISION TOTAL  KNEE ARTHROPLASTY Right 07-07-2005  . ROTATOR CUFF REPAIR Right   . TENDON REPAIR RIGHT RING FINGER  2011  . THUMB TRIGGER RELEASE Right 1993  . THYROIDECTOMY, PARTIAL  1983  . TONSILLECTOMY  1968  . TOTAL KNEE ARTHROPLASTY Bilateral right 05-04-2000/  left 03-29-2001  . VULVECTOMY  05/27/2012   Procedure: WIDE EXCISION VULVECTOMY;  Surgeon: Imagene Gurney A. Alycia Rossetti, MD;  Location: [facility];  Service: Gynecology;  Laterality: N/A;  Wide Local Excision of Vulva   . VULVECTOMY N/A 11/23/2012   Procedure: WIDE LOCAL EXCISION OF THE VULVA;  Surgeon: Imagene Gurney A. Alycia Rossetti, MD;  Location: [facility];  Service: Gynecology;  Laterality: N/A;  left inferior vulvar biopsy excision left superior lesion left inferior vulvar excision  . VULVECTOMY N/A 08/02/2013   Procedure: WIDE LOCAL  EXCISION VULVA;  Surgeon: Imagene Gurney A. Alycia Rossetti, MD;  Location: [facility];  Service: Gynecology;  Laterality: N/A;  . VULVECTOMY Left 03/29/2014   Procedure: WIDE LOCAL EXCISION OF LEFT VULVA ;  Surgeon: Imagene Gurney A. Alycia Rossetti, MD;  Location: [facility];  Service: Gynecology;  Laterality: Left;  Marland Kitchen VULVECTOMY Bilateral 01/24/2015   Procedure: WIDE LOCAL EXCISION VULVA;  Surgeon: [name], MD;  Location: [facility];  Service: Gynecology;  Laterality: Bilateral;  . VULVECTOMY PARTIAL  07-16-2010    Current Outpatient Medications  Medication Sig Dispense Refill  . albuterol (PROVENTIL HFA;VENTOLIN HFA) 108 (90 BASE) MCG/ACT inhaler Inhale 2 puffs into the lungs every 4 (four) hours as needed for wheezing or shortness of breath. Reported on 08/01/2015    . aspirin EC 81 MG EC tablet Take 1 tablet (81 mg total) daily by mouth. 30 tablet 0  . atorvastatin (LIPITOR) 40 MG tablet Take 1 tablet (40 mg total) daily at 6 PM by mouth. 30 tablet 0  . baclofen (LIORESAL) 10 MG tablet TAKE 1 TABLET BY MOUTH TWICE A DAY 180 tablet 0  . budesonide-formoterol (SYMBICORT) 160-4.5 MCG/ACT inhaler Inhale into the lungs.    Marland Kitchen buPROPion (WELLBUTRIN  XL) 150 MG 24 hr tablet Take 150 mg by mouth every morning.     . Calcium Carb-Cholecalciferol (OYSTER SHELL CALCIUM/VITAMIN D) 500-200 MG-UNIT TABS Take 2 tablets by mouth daily.    . Calcium Carbonate-Vitamin D 600-125 MG-UNIT TABS Take 1 tablet by mouth daily.     . Cholecalciferol (VITAMIN D3) 1000 UNITS CAPS Take 2 capsules by mouth daily.    . citalopram (CELEXA) 40 MG tablet Take 40 mg by mouth every morning.     . clindamycin (CLEOCIN) 150 MG capsule Take 4 tablets 30 minutesprior to dental procedure.    . fluconazole (DIFLUCAN) 100 MG tablet TAKE ONE TABLET (100 MG DOSE) BY MOUTH DAILY FOR 7 DAYS.  0  . furosemide (LASIX) 40 MG tablet Take 40 mg by mouth 2 (two) times daily.    Marland Kitchen HYDROcodone-homatropine (HYCODAN) 5-1.5 MG/5ML syrup TAKE 2.5MLS TO 5 MLS BY MOUTH AT BEDTIME  0  . hydrOXYzine (ATARAX/VISTARIL) 25 MG tablet Take 1 tablet (25 mg total) by mouth 2 (two) times daily as needed for itching. 30 tablet 0  . ibandronate (BONIVA) 150 MG tablet Take 150 mg every 30 (thirty) days by mouth. Take in the morning with a full glass of water, on an empty stomach, and do not take anything else  by mouth or lie down for the next 30 min.    Marland Kitchen ipratropium-albuterol (DUONEB) 0.5-2.5 (3) MG/3ML SOLN ipratropium 0.5 mg-albuterol 3 mg (2.5 mg base)/3 mL nebulization soln  INHALE 3 MLS (1 VIAL) BY NEBULIZATION EVERY 4 (HOURS AS NEEDED FOR WHEEZING.    Marland Kitchen ketoconazole (NIZORAL) 2 % cream Apply topically.    Marland Kitchen levothyroxine (SYNTHROID) 175 MCG tablet TAKE 1 TABLET BY MOUTH EVERY DAY    . montelukast (SINGULAIR) 10 MG tablet Take 10 mg daily by mouth.     . Multiple Vitamin (MULTIVITAMIN WITH MINERALS) TABS tablet Take 1 tablet daily by mouth.    . Multiple Vitamins tablet Take by mouth.    . Omega-3 1000 MG CAPS Take 2 g daily by mouth.     . pantoprazole (PROTONIX) 40 MG tablet Take 40 mg by mouth every morning.     . polyvinyl alcohol (LIQUIFILM TEARS) 1.4 % ophthalmic solution Place 1 drop into both  eyes as needed for dry eyes.    . potassium chloride (K-DUR) 10 MEQ tablet TAKE 1 TABLET BY MOUTH EVERY DAY    . traMADol (ULTRAM) 50 MG tablet Take by mouth.    . Vitamins/Minerals TABS Take by mouth.    . linaclotide (LINZESS) 290 MCG CAPS capsule Take by mouth.     No current facility-administered medications for this visit.     Social History   Socioeconomic History  . Marital status: Married    Spouse name: Not on file  . Number of children: Not on file  . Years of education: Not on file  . Highest education level: Not on file  Occupational History  . Not on file  Social Needs  . Financial resource strain: Not on file  . Food insecurity:    Worry: Not on file    Inability: Not on file  . Transportation needs:    Medical: Not on file    Non-medical: Not on file  Tobacco Use  . Smoking status: Former Smoker    Packs/day: 2.00    Years: 41.00    Pack years: 82.00    Types: Cigarettes    Last attempt to quit: 06/05/1999    Years since quitting: 19.0  . Smokeless tobacco: Never Used  Substance and Sexual Activity  . Alcohol use: No  . Drug use: No  . Sexual activity: Not on file  Lifestyle  . Physical activity:    Days per week: Not on file    Minutes per session: Not on file  . Stress: Not on file  Relationships  . Social connections:    Talks on phone: Not on file    Gets together: Not on file    Attends religious service: Not on file    Active member of club or organization: Not on file    Attends meetings of clubs or organizations: Not on file    Relationship status: Not on file  . Intimate partner violence:    Fear of current or ex partner: Not on file    Emotionally abused: Not on file    Physically abused: Not on file    Forced sexual activity: Not on file  Other Topics Concern  . Not on file  Social History Narrative  . Not on file    Family History  Problem Relation Age of Onset  . Cancer Father 70       lung and kidney   . Alzheimer's  disease Mother   . Heart attack  Maternal Grandmother   . Alcohol abuse Brother   . Breast cancer Paternal Aunt   . Breast cancer Cousin   . Breast cancer Cousin       Marti Sleigh, MD 06/08/2018, 1:57 PM

## 2018-06-21 ENCOUNTER — Ambulatory Visit (INDEPENDENT_AMBULATORY_CARE_PROVIDER_SITE_OTHER): Payer: Medicare HMO | Admitting: Orthopedic Surgery

## 2018-06-30 ENCOUNTER — Ambulatory Visit (INDEPENDENT_AMBULATORY_CARE_PROVIDER_SITE_OTHER): Payer: Medicare HMO | Admitting: Orthopedic Surgery

## 2018-06-30 ENCOUNTER — Telehealth (INDEPENDENT_AMBULATORY_CARE_PROVIDER_SITE_OTHER): Payer: Self-pay | Admitting: Radiology

## 2018-06-30 ENCOUNTER — Other Ambulatory Visit (INDEPENDENT_AMBULATORY_CARE_PROVIDER_SITE_OTHER): Payer: Self-pay

## 2018-06-30 ENCOUNTER — Encounter (INDEPENDENT_AMBULATORY_CARE_PROVIDER_SITE_OTHER): Payer: Self-pay | Admitting: Orthopedic Surgery

## 2018-06-30 DIAGNOSIS — G8929 Other chronic pain: Secondary | ICD-10-CM

## 2018-06-30 DIAGNOSIS — M25512 Pain in left shoulder: Secondary | ICD-10-CM

## 2018-06-30 MED ORDER — TRAMADOL HCL 50 MG PO TABS: ORAL_TABLET | ORAL | 0 refills | Status: DC

## 2018-06-30 NOTE — Addendum Note (Signed)
Addended by: Michae Kava B on: 06/30/2018 02:00 PM   Modules accepted: Orders

## 2018-06-30 NOTE — Telephone Encounter (Signed)
Called into pharmacy

## 2018-06-30 NOTE — Telephone Encounter (Signed)
Dr. Marlou Sa approved Tramadol 1 3times a day #45 Daneil Dolin

## 2018-06-30 NOTE — Telephone Encounter (Signed)
Patient was seen in office today. She had to leave before pain medication was filled. Tylenol #3 was prescribed however has an allergy to medication.  Patient has taken Tramadol in the past is this ok?

## 2018-06-30 NOTE — Progress Notes (Signed)
Office Visit Note   Patient: [name]           Date of Birth: 1945-10-02           MRN: [id number] Visit Date: 06/30/2018 Requested by: Chesley Noon, MD [place], [place] [id number] PCP: Chesley Noon, MD  Subjective: Chief Complaint  Patient presents with  . Left Shoulder - Pain, Follow-up    HPI: [name] is a patient with known left shoulder arthritis.  Last injection was in October.  That gave her marginal relief.  Prior to injections helped more.  She reports diminished range of motion and strength and a lot of pain in that left arm.  Her right arm does reasonably well.  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              ROS: All systems reviewed are negative as they relate to the chief complaint within the history of present illness.  Patient denies  fevers or chills.   Assessment & Plan: Visit Diagnoses:  1. Chronic left shoulder pain     Plan: Impression is symptomatic end-stage left shoulder arthritis with marginal response to prior injection.  I think she would do well with the fluoroscopic injection with Dr. Ernestina Patches.  Beyond that she is going to need to decide if she can live with this pain or whether or not she wants to get reverse shoulder replacement.  I will see her back as needed   Follow-Up Instructions: Return if symptoms worsen or fail to improve.   Orders:  Orders Placed This Encounter  Procedures  . Ambulatory referral to Physical Medicine Rehab   No orders of the defined types were placed in this encounter.     Procedures: No procedures performed   Clinical Data: No additional findings.  Objective: Vital Signs: There were no vitals taken for this visit.  Physical Exam:   Constitutional: Patient appears well-developed HEENT:  Head: Normocephalic Eyes:EOM are normal Neck: Normal range of motion Cardiovascular: Normal rate Pulmonary/chest: Effort normal Neurologic: Patient is  alert Skin: Skin is warm Psychiatric: Patient has normal mood and affect    Ortho Exam: Ortho exam demonstrates full active and passive range of motion of the right arm.  Left arm has abduction less than 80 forward flexion less than 90 and external rotation about 10 degrees.  Coarse grinding is present.  No other masses lymphadenopathy or skin changes noted in the shoulder girdle region.  Radial pulses intact.  Deltoid is functional.  Specialty Comments:  No specialty comments available.  Imaging: No results found.   PMFS History: Patient Active Problem List   Diagnosis Date Noted  . Shortness of breath   . ACS (acute coronary syndrome) ([place]) 04/11/2017  . Chest pain, rule out acute myocardial infarction 04/10/2017  . Bigeminy 04/10/2017  . Chronic cystitis 04/09/2017  . Thrombocytopenic disorder ([place]) 02/25/2017  . Osteoporosis 12/15/2016  . 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 Incomplete emptying of bladder 07/30/2016  . Left foot pain 12/11/2015  . 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 Stopped smoking with greater than 40 pack year history 10/23/2015  . 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 Mixed incontinence urge and stress 09/16/2011  . Cancer of right breast, stage 2 ([place]) 05/28/2011  . 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 Hypothyroid 05/28/2011  . GERD (gastroesophageal reflux disease) 05/28/2011  .  Fibromyalgia 05/28/2011  . 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 Mild depression ([place]) 05/28/2011  . Thrombocytopenia, unspecified ([place]) 05/28/2011  . Morbid obesity ([place]) 04/10/2011  . GERD 11/09/2009  . ALLERGIC RHINITIS 08/23/2007  . ASTHMA 08/23/2007  . COUGH 08/23/2007  . SKIN CANCER, HX OF 08/23/2007   Past Medical History:  Diagnosis Date  . Anxiety   . Arthritis   . Asthma   . Chronic constipation   . Depression   . Fibromyalgia   . GERD (gastroesophageal  reflux disease)   . 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 History of colon polyps    2007 hyperplagia  . History of esophageal dilatation    2008  . History of peptic ulcer   . History of small bowel obstruction    2012  . Hypothyroidism   . Macular degeneration of right eye   . Numbness of left foot    4 toes  . Paget's disease of vulva   . Personal history of chemotherapy 1997  . Personal history of radiation therapy 2005  . PONV (postoperative nausea and vomiting)    and HARD TO WAKE  . Seasonal allergies   . SUI (stress urinary incontinence, female)   . Wears glasses     Family History  Problem Relation Age of Onset  . Cancer Father 21       lung and kidney   . Alzheimer's disease Mother   . Heart attack Maternal Grandmother   . Alcohol abuse Brother   . Breast cancer Paternal Aunt   . Breast cancer Cousin   . Breast cancer Cousin     Past Surgical History:  Procedure Laterality Date  . BLADDER SUSPENSION  10/13/2011   Procedure: TRANSVAGINAL TAPE (TVT) PROCEDURE;  Surgeon: Delice Lesch, MD;  Location: [facility];  Service: Gynecology;  Laterality: N/A;  . BREAST BIOPSY Left 03/19/2004  . BREAST LUMPECTOMY Left 03-27-2004  . CARDIAC CATHETERIZATION  10-30-2000  dr Wynonia Lawman   normal coronary arteries and LVF/  ef 65-70%  . CLOSED MANIPULATION  W/ OPEN REDUCTION EXCHANGE TIBIAL FEMORAL BEARING POST TOTAL LEFT KNEE  04-14-2001  . CYSTOSCOPY  10/13/2011   Procedure: CYSTOSCOPY;  Surgeon: Delice Lesch, MD;  Location: [facility];  Service: Gynecology;  Laterality: Bilateral;  . DILATION AND CURETTAGE OF UTERUS  1968  . FOOT SURGERY Left 02-01-2009   TRIPLE ARTHRODESIS  . FOOT SURGERY Left 11/14   I&D left foot with woundvac placement  . INCISIONAL HERNIA REPAIR  05/05/2011   Procedure: LAPAROSCOPIC INCISIONAL HERNIA;   Surgeon: Edward Jolly, MD;  Location: [facility];  Service: General;  Laterality: N/A;  LAPAROSCOPIC REPAIR INCARCERATED INCISIONAL HERNIA  WITH MESH  . KNEE ARTHROSCOPY Bilateral left [phone] & 04-1992/   right [phone]  . LAPAROSCOPIC CHOLECYSTECTOMY  06-21-2010  . LEFT HEART CATH AND CORONARY ANGIOGRAPHY N/A 04/13/2017   Procedure: LEFT HEART CATH AND CORONARY ANGIOGRAPHY;  Surgeon: Jettie Booze, MD;  Location: [facility];  Service: Cardiovascular;  Laterality: N/A;  . LUMBAR SPINE SURGERY  1998   L5 -- S1  . MASTECTOMY Right 1997   W/  LYMPH NODE DISSECTION AND RECONSTRUCTION WITH IMPLANTS AND EXPANDER  . NEGATIVE SLEEP STUDY  12/26/2015  . PORT-A-CATH PLACEMENT AND REMOVAL  1997  .  REMOVAL RIGHT BREAST IMPLANT AND CAPSULECTOMY  06-03-1999  . REVISION TOTAL KNEE ARTHROPLASTY Left 07-07-2005  &  12-10-2001   12-10-2001  REMOVAL TOTAL KNEE/ I&D / INSERTION ANTIBIOTIC SPACER  . REVISION TOTAL KNEE ARTHROPLASTY Right 07-07-2005  . ROTATOR CUFF REPAIR Right   . TENDON REPAIR RIGHT RING FINGER  2011  . THUMB TRIGGER RELEASE Right 1993  . THYROIDECTOMY, PARTIAL  1983  . TONSILLECTOMY  1968  . TOTAL KNEE ARTHROPLASTY Bilateral right 05-04-2000/  left 03-29-2001  . VULVECTOMY  05/27/2012   Procedure: WIDE EXCISION VULVECTOMY;  Surgeon: Imagene Gurney A. Alycia Rossetti, MD;  Location: [facility];  Service: Gynecology;  Laterality: N/A;  Wide Local Excision of Vulva   . VULVECTOMY N/A 11/23/2012   Procedure: WIDE LOCAL EXCISION OF THE VULVA;  Surgeon: Imagene Gurney A. Alycia Rossetti, MD;  Location: [facility];  Service: Gynecology;  Laterality: N/A;  left inferior vulvar biopsy excision left superior lesion left inferior vulvar excision  . VULVECTOMY N/A 08/02/2013   Procedure: WIDE LOCAL  EXCISION VULVA;  Surgeon: Imagene Gurney A. Alycia Rossetti, MD;  Location: [facility];  Service: Gynecology;  Laterality: N/A;  . VULVECTOMY Left 03/29/2014   Procedure: WIDE LOCAL EXCISION OF LEFT VULVA ;  Surgeon: Imagene Gurney A. Alycia Rossetti, MD;  Location: [facility];  Service: Gynecology;  Laterality: Left;  Marland Kitchen VULVECTOMY Bilateral 01/24/2015   Procedure: WIDE LOCAL EXCISION VULVA;  Surgeon: [name], MD;  Location: [facility];  Service: Gynecology;  Laterality: Bilateral;  . VULVECTOMY PARTIAL  07-16-2010   Social History   Occupational History  . Not on file  Tobacco Use  . Smoking status: Former Smoker    Packs/day: 2.00    Years: 41.00    Pack years: 82.00    Types: Cigarettes    Last attempt to quit: 06/05/1999    Years since quitting: 19.0  . Smokeless tobacco: Never Used  Substance and Sexual Activity  . Alcohol use: No  . Drug use: No  . Sexual activity: Not on file

## 2018-06-30 NOTE — Telephone Encounter (Signed)
Looks good pls call in thx

## 2018-07-05 ENCOUNTER — Ambulatory Visit (INDEPENDENT_AMBULATORY_CARE_PROVIDER_SITE_OTHER): Payer: Medicare HMO | Admitting: Orthopedic Surgery

## 2018-07-16 ENCOUNTER — Ambulatory Visit (INDEPENDENT_AMBULATORY_CARE_PROVIDER_SITE_OTHER): Payer: Self-pay

## 2018-07-16 ENCOUNTER — Ambulatory Visit (INDEPENDENT_AMBULATORY_CARE_PROVIDER_SITE_OTHER): Payer: Medicare HMO | Admitting: Physical Medicine and Rehabilitation

## 2018-07-16 ENCOUNTER — Encounter (INDEPENDENT_AMBULATORY_CARE_PROVIDER_SITE_OTHER): Payer: Self-pay | Admitting: Physical Medicine and Rehabilitation

## 2018-07-16 DIAGNOSIS — M25512 Pain in left shoulder: Secondary | ICD-10-CM | POA: Diagnosis not present

## 2018-07-16 DIAGNOSIS — G8929 Other chronic pain: Secondary | ICD-10-CM | POA: Diagnosis not present

## 2018-07-16 DIAGNOSIS — M542 Cervicalgia: Secondary | ICD-10-CM

## 2018-07-16 NOTE — Progress Notes (Signed)
 .  Numeric Pain Rating Scale and Functional Assessment Average Pain 6   In the last MONTH (on 0-10 scale) has pain interfered with the following?  1. General activity like being  able to carry out your everyday physical activities such as walking, climbing stairs, carrying groceries, or moving a chair?  Rating(5)  -Dye Allergies.  

## 2018-07-16 NOTE — Progress Notes (Signed)
   [name] - 73 y.o. female MRN [id number]  Date of birth: 07-19-1945  Office Visit Note: Visit Date: 07/16/2018 PCP: Chesley Noon, MD Referred by: Chesley Noon, MD  Subjective: Chief Complaint  Patient presents with  . Left Shoulder - Pain  . Neck - Pain  . Left Upper Arm - Pain   HPI:  [name] is a 73 y.o. female who comes in today At the request of G. Alphonzo Severance for diagnostic glenohumeral joint injection and anesthetic arthrogram of the left shoulder.  Patient is getting pain in the neck and left shoulder that does radiate in the left upper arm but is worse with movement and she has decreased range of motion of the arm.  She reports pain started after a fall that happened years ago.  Definitely lifting the arm gives her more pain.  Average pain is 6 out of 10.  ROS Otherwise per HPI.  Assessment & Plan: Visit Diagnoses:  1. Chronic left shoulder pain     Plan: Findings:  Patient did have mild relief after the injection but actually did have significant range of motion increase.  Injection itself was slightly difficult to get good flow contrast and arthrogram.    Meds & Orders: No orders of the defined types were placed in this encounter.   Orders Placed This Encounter  Procedures  . Large Joint Inj: L glenohumeral  . XR C-ARM NO REPORT    Follow-up: No follow-ups on file.   Procedures: Large Joint Inj: L glenohumeral on 07/16/2018 10:58 AM Indications: pain and diagnostic evaluation Details: 22 G 3.5 in needle, fluoroscopy-guided anteromedial approach  Arthrogram: Yes  Medications: 3 mL bupivacaine 0.5 %; 60 mg triamcinolone acetonide 40 MG/ML Outcome: tolerated well, no immediate complications  There was excellent flow of contrast producing a partial arthrogram of the glenohumeral joint. The patient did have relief of symptoms during the anesthetic phase of the injection. Procedure, treatment alternatives, risks and benefits explained,  specific risks discussed. Consent was given by the patient. Immediately prior to procedure a time out was called to verify the correct patient, procedure, equipment, support staff and site/side marked as required. Patient was prepped and draped in the usual sterile fashion.      No notes on file   Clinical History: No specialty comments available.     Objective:  VS:  HT:    WT:   BMI:     BP:   HR: bpm  TEMP: ( )  RESP:  Physical Exam  Ortho Exam Imaging: No results found.

## 2018-07-20 ENCOUNTER — Ambulatory Visit (INDEPENDENT_AMBULATORY_CARE_PROVIDER_SITE_OTHER): Payer: Medicare HMO | Admitting: Physical Medicine and Rehabilitation

## 2018-07-24 MED ORDER — TRIAMCINOLONE ACETONIDE 40 MG/ML IJ SUSP
60.0000 mg | INTRAMUSCULAR | Status: AC | PRN
  Administered 2018-07-16: 60 mg via INTRA_ARTICULAR

## 2018-07-24 MED ORDER — BUPIVACAINE HCL 0.5 % IJ SOLN
3.0000 mL | INTRAMUSCULAR | Status: AC | PRN
  Administered 2018-07-16: 3 mL via INTRA_ARTICULAR

## 2018-08-25 ENCOUNTER — Other Ambulatory Visit: Payer: Self-pay | Admitting: Obstetrics and Gynecology

## 2018-08-25 DIAGNOSIS — Z1231 Encounter for screening mammogram for malignant neoplasm of breast: Secondary | ICD-10-CM

## 2018-09-30 DIAGNOSIS — Z860101 Personal history of adenomatous and serrated colon polyps: Secondary | ICD-10-CM

## 2018-09-30 DIAGNOSIS — Z86010 Personal history of colonic polyps: Secondary | ICD-10-CM

## 2018-09-30 HISTORY — DX: Personal history of adenomatous and serrated colon polyps: Z86.0101

## 2018-09-30 HISTORY — DX: Personal history of colonic polyps: Z86.010

## 2018-10-21 ENCOUNTER — Other Ambulatory Visit: Payer: Self-pay

## 2018-10-21 ENCOUNTER — Ambulatory Visit
Admission: RE | Admit: 2018-10-21 | Discharge: 2018-10-21 | Disposition: A | Discharge status: Home / Self Care | Payer: Medicare HMO | Source: Ambulatory Visit | Attending: Obstetrics and Gynecology | Admitting: Obstetrics and Gynecology

## 2018-10-21 ENCOUNTER — Other Ambulatory Visit: Payer: Self-pay | Admitting: Obstetrics and Gynecology

## 2018-10-21 DIAGNOSIS — Z1231 Encounter for screening mammogram for malignant neoplasm of breast: Secondary | ICD-10-CM

## 2018-10-21 DIAGNOSIS — N644 Mastodynia: Secondary | ICD-10-CM

## 2018-10-27 ENCOUNTER — Ambulatory Visit: Payer: Medicare HMO | Admitting: Podiatry

## 2018-10-27 ENCOUNTER — Encounter: Payer: Self-pay | Admitting: Podiatry

## 2018-10-27 ENCOUNTER — Other Ambulatory Visit: Payer: Self-pay

## 2018-10-27 VITALS — Temp 97.2°F

## 2018-10-27 DIAGNOSIS — Q828 Other specified congenital malformations of skin: Secondary | ICD-10-CM | POA: Diagnosis not present

## 2018-10-27 NOTE — Progress Notes (Signed)
This patient presents to the office with chief complaint of painful calluses on the bottom of her left foot.  She says these calluses are painful walking and wearing her shoes.  She says she needs her callus trimmed every three months.  She is unable to self treat.  She presents the office today for treatment of her painful callus left foot.  She desires only her left foot be treated.  Vascular  Dorsalis pedis and posterior tibial pulses are palpable    Capillary return  WNL.  Temperature gradient is  WNL.  Skin turgor  WNL  Sensorium  Senn Weinstein monofilament wire  WNL. Normal tactile sensation.  Nail Exam  Patient has normal nails with no evidence of bacterial or fungal infection.  Orthopedic  Exam  Muscle tone and muscle strength  WNL.  No limitations of motion feet  B.  No crepitus or joint effusion noted.  Foot type is unremarkable and digits show no abnormalities.  Bony prominences are unremarkable. MTA left foot.    Skin  No open lesions.  Normal skin texture and turgor.  Porokeratosis sub 5th metabase and sub 4th met head left foot.  Porokeratosis  Left foot.  Debride porokeratosis.    RTC 3 months.   Gregory Mayer DPM   

## 2018-11-18 ENCOUNTER — Telehealth: Payer: Self-pay | Admitting: *Deleted

## 2018-11-18 NOTE — Telephone Encounter (Signed)
Called the patient and moved her appt from 8/5 with DR. Clarke-Peaarson to 7/28 with Dr. Alycia Rossetti

## 2018-11-18 NOTE — Telephone Encounter (Signed)
Explained to the patient the check in procedure, parking, mask and no visitors

## 2018-12-20 NOTE — Progress Notes (Signed)
Consult Note: Gyn-Onc   [name] 73 y.o. female  Chief Complaint  Patient presents with  . Follow-up    Assessment : Long-standing history of Paget's disease of the vulva. Examination of the vulva shows no evidence of Paget's disease.    Plan:   She has such extensive Candida today that it is impossible to tell what could be Candida versus what could be Paget's as the entire left side of the lower abdominal wall, groin, mons, extending down to the vulvar and perianal region is erythematous.  She will use nystatin powder and cream as directed and return to see me on September 1.  At that time we will determine what area needs to be rebiopsied and determine her disposition.   HPI:  Patient is a 73 year old with a history of a partial simple vulvectomy in February of 2012 for vulvar Paget's disease. Some of the surgical margins were positive. Her postoperative course was uncomplicated. She was last seen by our service in 12/14. At that time there was no evidence of any lesions consistent with Paget's. However, there is considerable amount of excoriations throughout the entire vulvar and medial thighs consistent with chronic vulvitis.In December 2013 she had a hernia repair due to bowel obstruction by Dr. Excell Seltzer with permanent mesh. In May 2013 she also had a TVT bladder by Dr. Everett Graff. She does continue to wear a pad and also takes Detrol twice daily but states that her urinary leakage is markedly improved. She described the vulvar irritation is intermittent itching with burning and that it has increased over time as has the pruritus. She does use a steroid cream twice daily and does scratch her vulva.   She underwent a wide local excision of the left vulva on January 2 of year 2014. Operative findings included 2 separate 3 x 3 and 1.2 cm lesions consistent with Paget's on the left.  Pathology revealed 2 foci of extramammary Paget's disease extending to the left inked margin in the  right and anterior tip margins at multiple foci. The posterior margin was negative for malignancy.   I performed a biopsy of this it was consistent with recurrent Paget's disease. On 11/23/2012 showed a repeat wide local excision of vulva x2 with the posterior fourchette biopsy. Findings revealed along the left labia minora/vagina is a Paget's disease measuring approximately 2.5 x 1 cm in the superior aspect of her prior left vulvar excision. The inferior aspect of a similar lesion. 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 On the left perineal body the area is somewhat excoriated pale. Biopsy was performed in this area.  Pathology revealed:  1. Vulva, biopsy, left inferior - EXTRAMAMMARY PAGET DISEASE EXTENDING TO THE EDGES OF THE BIOPSY. 2. Vulva, excision, left superior lesion suture marks 1200 - EXTRAMAMMARY PAGET DISEASE. - MARGINS NOT INVOLVED. - CLOSEST MARGIN 9 O'CLOCK, LESS THAN 0.1 CM. 3. Vulva, excision, left inferior excision suture marks 1200 - EXTRAMAMMARY PAGET DISEASE, 12 O'CLOCK MARGIN FOCALLY INVOLVED. - REMAINING MARGINS CLEAR.  We have discussed repeating surgery however she had multiple orthopedic issues had broken ankle. She was seen by [name] in December 2014. Exam at that time was fairly unremarkable.  I had seen her in January of 2015. At that time exam was concerning for Paget's.   On 08/02/2013 she underwent wide local excisions of the vulva x2.  Operative findings included pale pink raised lesion measuring 1 x 0.5 cm the left upper vulva near  the prior excision site. There is a pale raised lesion measuring 1.5 x 1 cm on the left crossing midline perineal body.  Pathology revealed left superior vulvar excision with extramammary Paget's disease extending to the 9:00 margin. The other margins were negative for tumor. Of the left of midline lesion she extramammary Paget's disease extending to the 12:00 margin and focally at the  6:00 margin but otherwise negative margins. The patient at that time discussed with [place] that she was having some right lower quadrant pain. Her exam is very difficult secondary to habitus. There ultrasound was obtained. Ultrasound revealed:  FINDINGS:  Uterus Measurements: 4.6 x 3.5 x 3.8 cm. 2.5 x 1.7 x 1.6 cm uterine fibroid. This is present in the right lateral aspect of the body uterus.  Endometrium Thickness: 6.1 mm. No focal abnormality visualized.  Right ovary: No focal abnormality.  Left ovary :No focal abnormality.  Other findings: No free fluid  IMPRESSION:  Uterine fibroid otherwise negative exam.  I saw her March 16, 2014 at which time there was an area on the left vulva with hyperkeratosis. A biopsy of that area was performed consistent with Paget's disease. On March 29, 2014 she underwent wide local excision of the vulva. On exam she had a 1 cm left vulvar lesion just below the left labia minora. She underwent a wide local excision. He received a 3.7 x 1.7 x 0.3 cm lesion. There was extramammary Paget's disease extending to the 6 to 9:00 margin in the 12:00 margin. This is despite grossly negative margins on physical examination.   She had an excision in November that revealed Diagnosis Vulva, excision, w/stitch @ 1200 - EXTRAMAMMARY PAGET'S DISEASE, EXTENDING TO THE 6-9 O'CLOCK MARGIN AND 12 O'CLOCK PERPENDICULAR MARGIN.  I saw her in February 2016 at which time there was concern for recurrent disease. She decided to proceed with Aldara therapy. She used for approximate 4 weeks and then decided to stop it secondary to the expense of the medication. She then started using the clobetasol. While she was using the Aldara all the pruritus in her vulva resolved itself. Since she stopped it and was using the steroid cream the pruritus returned.  I saw her in May 2016 at which time she has small area consistent with Paget's on the right labia minora and a larger patch on the left.  Biopsy on the right revealed extramammary Paget's. After discussion she wished to try the Aldara again. She called and requested an earlier appointment as for the last 2-3 weeks had increased itching and has been scratching so much that she's had some bleeding. I last saw her on July 6. At that time she had been having a lot of symptoms of pruritus and scratching a great deal. Vulvar examination at that time revealed a fairly macerated vulva and it was possible to discern what could be related to Paget's, effect of the Aldara therapy, trauma from scratching. She was asked to stop the Aldara therapy and use a low-dose daily cream. We asked her to refrain from scratching come in today for evaluation.  I saw her again December 26, 2104 at which time there was evidence clearly a Paget's disease and she scheduled for surgery. On January 24, 2015 she underwent bilateral wide local excisions of the vulva. On the right vulva there was a 2 cm patch of Paget's disease in the mid labia majora. On the left side. The remaining labia minora in the area prior to the excision are smaller lesion  consistent with Paget's disease.  Diagnosis 1. Vulva, excision, left wide excision - EXTRAMAMMARY PAGET'S DISEASE EXTENDING TO THE 12 O'CLOCK, 3 O'CLOCK, 6 O'CLOCK AND 9 O'CLOCK MARGINS. 2. Vulva, excision, right wide excision - EXTRAMAMMARY PAGET'S EXTENDING TO THE 3 O'CLOCK MARGIN AND THE 6 O'CLOCK POLE.  Please refer to the photograph in the operative note from Dr. Skeet Latch for complete mapping of her lesion. She still having a little bit of spotting from the biopsy sites and some pain.  The patient initially underwent partial simple vulvectomy in February 2012 for vulvar Paget's disease. Some of her surgical margins were positive. She had an uncomplicated post operative course. She had some evidence of vulvar irritation afterward with symptoms of pruritus and burning that increased over time and for which she was using a  steroid cream twice daily. She underwent wide local excision of the left vulva in January 2014. Findings at the time of surgery were to areas consistent with Paget's on the left, one measuring 3 x 3 cm and one measuring 1.2 cm pathology revealed 2 foci of extramammary Paget's disease extending to the left intact margin in the right and anterior tip margins at multiple foci. The patient was seen for follow-up with recurrent Paget's disease noted. In July 2014, she underwent repeat wide local excision of 2 areas in the posterior fourchette biopsy. Findings were an area along the left labium minora measuring 2.5 x 1 cm at the superior aspect of her prior left vulvar excision. There was a similar lesion at the inferior aspect. It appeared that the patient also had lichen sclerosis of the right vulva. Left perineal body biopsy was performed secondary to some noted excoriation. Pathology from the 2 excisions showed extramammary Paget's disease, margins on left superior lesion were negative, margin at 12:00 on inferior lesion focally involved. Perineal body biopsy positive for Paget's disease.   The patient then underwent several orthopedic surgeries and ultimately was taken back to the operating room in March 2015 for 2 wide local excisions of the vulva. Findings at that time included a pill raised pink lesion measuring 1 x 0.5 cm of the left upper vulva near the prior excision site and a second pill raised lesion measuring 1.5 x 1 cm on the left crossing midline perineal body. Pathology showed that the left superior vulvar excision was Paget's disease extending to the 9:00 margin. Other margins were negative. The left midline lesion also was Paget's disease extending to the 12:00 margin and focally to the 6:00 margin. Patient was then seen back in October 2015 at which time there was an area on the left vulva with hyperkeratosis. A biopsy of that area confirmed Paget's disease. Patient underwent repeat wide local  excision of the vulva on March 29, 2014. Findings were a 1 cm left vulvar lesion just below the left labium minora. Pathology showed Paget's disease extending to the 6, 9, and 12:00 margins. This was despite grossly negative margins on physical exam.  Patient was next seen in February 2016 at which time there was concern for recurrent disease. She decided to proceed with Aldara therapy which she used for 4 weeks and then stopped it secondary to the expense of the medication. Started using clobetasol. Her pruritus stopped with Aldara, but unfortunately returned when she switched to clobetasol. In May 2016 she was seen and had an area that was smaller still consistent with Paget's disease on the right labia minora and a larger patch on the left. Biopsy on the right  revealed extramammary Paget's. She opted to try Aldara again. Given evidence of recurrent disease she underwent bilateral wide local excision of the vulva at the end of August 2016. Both wide local excision specimens were positive for Paget's disease with extension to focal margins on each.  Most recently, the patient was taken to the operating room on 01/31/2016 for preoperative mapping of her lesion for her planned wide local excision of the left vulva and smaller excision on the right. Operative findings at that time were notable for surgically absent inferior aspect of the left labium minora and majora. Multiple areas with white flaky skin changes were biopsied and the abnormal-appearing tissue. Changes noted in the area of the clitoris and perineum and at the introitus just distal to the hymen. Biopsies were collected on all of the sites.   10/17 Findings: 1. Surgical absent left labia majorum inferiorly from prior vulvectomies.  2. Focusing on the left vulva, there was gross disease incorporating the prior surgical site, characterized by patchy white and erythematous epithelium. The affected area spanned from the perineal body to 5-6cm  laterally. Prior mapping biopsy sites x3 were well healed and visible and surrounded by what appeared to be grossly normal appearing skin. Superiorly on the left labia majorum, there was affected epithelium with a similar appearing patchy white, erythematous epithelium.  3. Focusing of the right vulva, there was less extensive disease. Affected area extended from the perineal body to roughly 3cm laterally with minimal involvement of the right labia majorum.  4. The urethra, anus, and clitoris were spared.  5. A rectal exam was performed during the procedure to confirm protection of the sphincter, which was intact at the conclusion of our case.  6. A rectocele was notable 3cm into the vagina.   Pathology: Final Diagnosis  A: Vulva, right and left, vulvectomy - (Recurrent/persisent) Paget disease  - Disease size approximately 8.3 x 4.6 cm  - Disease confined to the epidermis - Multiple epidermal margins are positive for disease: 6 o'clock to 11 o'clock (per original orientation)  - Deep margin free of involvement  Interval History:  She was last see by Dr. Fermin Schwab in 1/20 with no evidence of recurrent disease.  She states that she was seen by Dr. Cletis Media on July 8 and there was a questionable new lesion on the left side.  She states that she is itching "all over".  Prior to this she had been completely asymptomatic.  She is very tearful today they have significant financial issues.  Her husband retired about 10 years ago and she states he has not been able to find work.  He just now started getting Social Security.  She is very frustrated that he is either overqualified or under qualified for jobs but she feels it is mostly related to his age.  He is not even able to get jobs in the service industry.  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  Since he started getting Social Security the pressures are little bit better.  She did have a colonoscopy  about 3 to 4 months ago.  She had 3 large polyps they were fortunately all benign.  They recommend a follow-up in 2 years.  She states her mammograms are up-to-date.  Review of Systems: Constitutional: Denies fever. Skin: As above Cardiovascular: No chest pain Pulmonary: No cough  Gastro Intestinal: No nausea, vomiting, constipation, or diarrhea reported.  Genitourinary: Denies vaginal bleeding  and discharge. + itching all overy Musculoskeletal: No changes, chronic pain and movement limitation Psychology: Very tearful secondary to concerns about their finances.    Allergies  Allergen Reactions  . Hydromorphone Hcl Itching and Rash    Per CRNA, pt tol Fentanyl IV for OR case, no issues reported to rec RN in PACU, no immediate PACU issues noted as well upon arrival  . Demerol  [Meperidine Hcl] Other (See Comments)    "Knocks me out"  . Other Other (See Comments)    Blisters  . Codeine Other (See Comments)    hallucinations Hyper/ Hallucinations   . Demerol [Meperidine] Other (See Comments)    "Knocks me out"  . Diazepam Other (See Comments)    Extreme sedation; patient does not recall taking diazepam (12/07/14)  . Tape Hives and Other (See Comments)    "thick clear plastic tape" causes blisters Blisters Blisters  . Macrobid [Nitrofurantoin] Rash  . Morphine And Related Itching and Rash    Per CRNA, pt tol Fentanyl IV for OR case, no issues reported to rec RN in PACU, no immediate PACU issues noted as well upon arrival  . Penicillins Hives and Swelling  . Valisone [Betamethasone] Rash    Past Medical History:  Diagnosis Date  . Anxiety   . Arthritis   . Asthma   . Chronic constipation   . Depression   . Fibromyalgia   . GERD (gastroesophageal reflux disease)   . 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 History of colon polyps     2007 hyperplagia  . History of esophageal dilatation    2008  . History of peptic ulcer   . History of small bowel obstruction    2012  . Hypothyroidism   . Macular degeneration of right eye   . Numbness of left foot    4 toes  . Paget's disease of vulva   . Personal history of chemotherapy 1997  . Personal history of radiation therapy 2005  . PONV (postoperative nausea and vomiting)    and HARD TO WAKE  . Seasonal allergies   . SUI (stress urinary incontinence, female)   . Wears glasses     Past Surgical History:  Procedure Laterality Date  . BLADDER SUSPENSION  10/13/2011   Procedure: TRANSVAGINAL TAPE (TVT) PROCEDURE;  Surgeon: Delice Lesch, MD;  Location: [facility];  Service: Gynecology;  Laterality: N/A;  . BREAST BIOPSY Left 03/19/2004  . BREAST LUMPECTOMY Left 03-27-2004  . CARDIAC CATHETERIZATION  10-30-2000  dr Wynonia Lawman   normal coronary arteries and LVF/  ef 65-70%  . CLOSED MANIPULATION  W/ OPEN REDUCTION EXCHANGE TIBIAL FEMORAL BEARING POST TOTAL LEFT KNEE  04-14-2001  . CYSTOSCOPY  10/13/2011   Procedure: CYSTOSCOPY;  Surgeon: Delice Lesch, MD;  Location: [facility];  Service: Gynecology;  Laterality: Bilateral;  . DILATION AND CURETTAGE OF UTERUS  1968  . FOOT SURGERY Left 02-01-2009   TRIPLE ARTHRODESIS  . FOOT SURGERY Left 11/14   I&D left foot with woundvac placement  . INCISIONAL HERNIA REPAIR  05/05/2011   Procedure: LAPAROSCOPIC INCISIONAL HERNIA;  Surgeon: Edward Jolly, MD;  Location: [facility];  Service: General;  Laterality: N/A;  LAPAROSCOPIC REPAIR INCARCERATED INCISIONAL HERNIA  WITH MESH  . KNEE ARTHROSCOPY Bilateral left [phone] & 04-1992/   right [phone]  .  LAPAROSCOPIC CHOLECYSTECTOMY  06-21-2010  . LEFT HEART CATH AND CORONARY ANGIOGRAPHY N/A 04/13/2017   Procedure: LEFT HEART CATH AND CORONARY ANGIOGRAPHY;  Surgeon: Jettie Booze, MD;  Location: [facility];  Service: Cardiovascular;  Laterality: N/A;  . LUMBAR SPINE SURGERY   1998   L5 -- S1  . MASTECTOMY Right 1997   W/  LYMPH NODE DISSECTION AND RECONSTRUCTION WITH IMPLANTS AND EXPANDER  . NEGATIVE SLEEP STUDY  12/26/2015  . PORT-A-CATH PLACEMENT AND REMOVAL  1997  . REMOVAL RIGHT BREAST IMPLANT AND CAPSULECTOMY  06-03-1999  . REVISION TOTAL KNEE ARTHROPLASTY Left 07-07-2005  &  12-10-2001   12-10-2001  REMOVAL TOTAL KNEE/ I&D / INSERTION ANTIBIOTIC SPACER  . REVISION TOTAL KNEE ARTHROPLASTY Right 07-07-2005  . ROTATOR CUFF REPAIR Right   . TENDON REPAIR RIGHT RING FINGER  2011  . THUMB TRIGGER RELEASE Right 1993  . THYROIDECTOMY, PARTIAL  1983  . TONSILLECTOMY  1968  . TOTAL KNEE ARTHROPLASTY Bilateral right 05-04-2000/  left 03-29-2001  . VULVECTOMY  05/27/2012   Procedure: WIDE EXCISION VULVECTOMY;  Surgeon: Imagene Gurney A. Alycia Rossetti, MD;  Location: [facility];  Service: Gynecology;  Laterality: N/A;  Wide Local Excision of Vulva   . VULVECTOMY N/A 11/23/2012   Procedure: WIDE LOCAL EXCISION OF THE VULVA;  Surgeon: Imagene Gurney A. Alycia Rossetti, MD;  Location: [facility];  Service: Gynecology;  Laterality: N/A;  left inferior vulvar biopsy excision left superior lesion left inferior vulvar excision  . VULVECTOMY N/A 08/02/2013   Procedure: WIDE LOCAL  EXCISION VULVA;  Surgeon: Imagene Gurney A. Alycia Rossetti, MD;  Location: [facility];  Service: Gynecology;  Laterality: N/A;  . VULVECTOMY Left 03/29/2014   Procedure: WIDE LOCAL EXCISION OF LEFT VULVA ;  Surgeon: Imagene Gurney A. Alycia Rossetti, MD;  Location: [facility];  Service: Gynecology;  Laterality: Left;  Marland Kitchen VULVECTOMY Bilateral 01/24/2015   Procedure: WIDE LOCAL EXCISION VULVA;  Surgeon: [name], MD;  Location: [facility];  Service: Gynecology;  Laterality: Bilateral;  . VULVECTOMY PARTIAL  07-16-2010    Current Outpatient Medications  Medication Sig Dispense Refill  . albuterol (PROVENTIL HFA;VENTOLIN HFA) 108 (90 BASE) MCG/ACT inhaler Inhale 2 puffs into the lungs every 4 (four) hours as needed for wheezing or shortness of  breath. Reported on 08/01/2015    . aspirin EC 81 MG EC tablet Take 1 tablet (81 mg total) daily by mouth. 30 tablet 0  . atorvastatin (LIPITOR) 40 MG tablet Take 1 tablet (40 mg total) daily at 6 PM by mouth. 30 tablet 0  . baclofen (LIORESAL) 10 MG tablet TAKE 1 TABLET BY MOUTH TWICE A DAY 180 tablet 0  . budesonide-formoterol (SYMBICORT) 160-4.5 MCG/ACT inhaler Inhale into the lungs.    Marland Kitchen buPROPion (WELLBUTRIN XL) 150 MG 24 hr tablet Take 150 mg by mouth every morning.     . Calcium Carb-Cholecalciferol (OYSTER SHELL CALCIUM/VITAMIN D) 500-200 MG-UNIT TABS Take 2 tablets by mouth daily.    . Calcium Carbonate-Vitamin D 600-125 MG-UNIT TABS Take 1 tablet by mouth daily.     . Cholecalciferol (VITAMIN D3) 1000 UNITS CAPS Take 2 capsules by mouth daily.    . citalopram (CELEXA) 40 MG tablet Take 40 mg by mouth every morning.     . clindamycin (CLEOCIN) 150 MG capsule Take 4 tablets 30 minutesprior to dental procedure.    . fluconazole (DIFLUCAN) 100 MG tablet TAKE ONE TABLET (100 MG DOSE) BY MOUTH DAILY FOR 7 DAYS.  0  . furosemide (LASIX) 40 MG  tablet Take 40 mg by mouth 2 (two) times daily.    Marland Kitchen HYDROcodone-homatropine (HYCODAN) 5-1.5 MG/5ML syrup TAKE 2.5MLS TO 5 MLS BY MOUTH AT BEDTIME  0  . hydrOXYzine (ATARAX/VISTARIL) 25 MG tablet Take 1 tablet (25 mg total) by mouth 2 (two) times daily as needed for itching. 30 tablet 0  . ibandronate (BONIVA) 150 MG tablet Take 150 mg every 30 (thirty) days by mouth. Take in the morning with a full glass of water, on an empty stomach, and do not take anything else by mouth or lie down for the next 30 min.    Marland Kitchen ipratropium-albuterol (DUONEB) 0.5-2.5 (3) MG/3ML SOLN ipratropium 0.5 mg-albuterol 3 mg (2.5 mg base)/3 mL nebulization soln  INHALE 3 MLS (1 VIAL) BY NEBULIZATION EVERY 4 (HOURS AS NEEDED FOR WHEEZING.    Marland Kitchen ketoconazole (NIZORAL) 2 % cream Apply topically.    Marland Kitchen levothyroxine (SYNTHROID) 175 MCG tablet TAKE 1 TABLET BY MOUTH EVERY DAY    .  linaclotide (LINZESS) 290 MCG CAPS capsule Take by mouth.    . montelukast (SINGULAIR) 10 MG tablet Take 10 mg daily by mouth.     . Multiple Vitamin (MULTIVITAMIN WITH MINERALS) TABS tablet Take 1 tablet daily by mouth.    . Multiple Vitamins tablet Take by mouth.    . nystatin (MYCOSTATIN/NYSTOP) powder Apply topically 4 (four) times daily. 60 g 6  . nystatin cream (MYCOSTATIN) Apply 1 application topically 2 (two) times daily. 30 g 6  . Omega-3 1000 MG CAPS Take 2 g daily by mouth.     . pantoprazole (PROTONIX) 40 MG tablet Take 40 mg by mouth every morning.     . polyvinyl alcohol (LIQUIFILM TEARS) 1.4 % ophthalmic solution Place 1 drop into both eyes as needed for dry eyes.    . potassium chloride (K-DUR) 10 MEQ tablet TAKE 1 TABLET BY MOUTH EVERY DAY    . traMADol (ULTRAM) 50 MG tablet 1 tablet 3 times a day by mouth PRN for pain 45 tablet 0  . Vitamins/Minerals TABS Take by mouth.     No current facility-administered medications for this visit.     Social History   Socioeconomic History  . Marital status: Married    Spouse name: Not on file  . Number of children: Not on file  . Years of education: Not on file  . Highest education level: Not on file  Occupational History  . Not on file  Social Needs  . Financial resource strain: Not on file  . Food insecurity    Worry: Not on file    Inability: Not on file  . Transportation needs    Medical: Not on file    Non-medical: Not on file  Tobacco Use  . Smoking status: Former Smoker    Packs/day: 2.00    Years: 41.00    Pack years: 82.00    Types: Cigarettes    Quit date: 06/05/1999    Years since quitting: 19.5  . Smokeless tobacco: Never Used  Substance and Sexual Activity  . Alcohol use: No  . Drug use: No  . Sexual activity: Not on file  Lifestyle  . Physical activity    Days per week: Not on file    Minutes per session: Not on file  . Stress: Not on file  Relationships  . Social Herbalist on phone:  Not on file    Gets together: Not on file    Attends religious service: Not on  file    Active member of club or organization: Not on file    Attends meetings of clubs or organizations: Not on file    Relationship status: Not on file  . Intimate partner violence    Fear of current or ex partner: Not on file    Emotionally abused: Not on file    Physically abused: Not on file    Forced sexual activity: Not on file  Other Topics Concern  . Not on file  Social History Narrative  . Not on file    Family History  Problem Relation Age of Onset  . Cancer Father 46       lung and kidney   . Alzheimer's disease Mother   . Heart attack Maternal Grandmother   . Alcohol abuse Brother   . Breast cancer Paternal Aunt   . Breast cancer Cousin   . Breast cancer Cousin    Vitals: Blood pressure (!) 137/59, pulse 91, temperature 98.5 F (36.9 C), temperature source Oral, resp. rate 18, height 5\' 5"  (1.651 m), weight 242 lb (109.8 kg), SpO2 97 %.  Physical Exam: General : The patient is a healthy woman in no acute distress.   Groins: The right groin is without lymphadenopathy and there is no erythema.  The left groin has no adenopathy but throughout the left groin, the lower abdominal wall near the pannus in the left thigh has a large lack of Candida with satellite lesions throughout.  This extends down the labia majora on the left to the vulvar region.  Upon opening and parting the labia majora in the peri-vaginal area and along all of the mucosa there is raised erythematous area that could be consistent with Paget's or it could be significant candidal infection.  This extends to around the perianal region.  I discussed with the patient that I would not know what area to biopsy as it it all looks very excoriated and indurated.     Landrie Beale A., MD 12/21/2018, 2:17 PM

## 2018-12-21 ENCOUNTER — Inpatient Hospital Stay: Payer: Medicare HMO | Attending: Gynecology | Admitting: Gynecologic Oncology

## 2018-12-21 ENCOUNTER — Other Ambulatory Visit: Payer: Self-pay

## 2018-12-21 VITALS — BP 137/59 | HR 91 | Temp 98.5°F | Resp 18 | Ht 65.0 in | Wt 242.0 lb

## 2018-12-21 DIAGNOSIS — C4499 Other specified malignant neoplasm of skin, unspecified: Secondary | ICD-10-CM

## 2018-12-21 DIAGNOSIS — C519 Malignant neoplasm of vulva, unspecified: Secondary | ICD-10-CM | POA: Insufficient documentation

## 2018-12-21 MED ORDER — NYSTATIN 100000 UNIT/GM EX CREA: 1.0000 "application " | TOPICAL_CREAM | Freq: Two times a day (BID) | CUTANEOUS | 6 refills | Status: DC

## 2018-12-21 MED ORDER — NYSTATIN 100000 UNIT/GM EX POWD: Freq: Four times a day (QID) | CUTANEOUS | 6 refills | Status: DC

## 2018-12-21 NOTE — Patient Instructions (Signed)
Please use the nystatin cream twice a day on the vulva and the powder twice a day on the left side as directed.  Return to see me on September 1.Nystatin skin cream or ointment What is this medicine? NYSTATIN (nye STAT in) is an antifungal medicine. It is used to treat certain kinds of fungal or yeast infections of the skin. This medicine may be used for other purposes; ask your health care provider or pharmacist if you have questions. COMMON BRAND NAME(S): Mycostatin, Nystex, Pediaderm AF What should I tell my health care provider before I take this medicine? They need to know if you have any of these conditions:  an unusual or allergic reaction to nystatin, other foods, dyes or preservatives  pregnant or trying to get pregnant  breast-feeding How should I use this medicine? This medicine is for external use on the skin only. Follow the directions on the prescription label. Wash hands before and after use. If treating a hand or nail infection, wash hands before use only. Apply a thin layer of this medicine to cover the affected skin and surrounding area. You can cover the area with a sterile gauze dressing (bandage). Do not use an airtight bandage (such as a plastic-covered bandage). Do not get the medicine in your eyes. If you do, rinse out with plenty of cool tap water. Use the full course of treatment prescribed, even if you think the infection is getting better. Use at regular intervals. Do not use your medicine more often than directed. Do not use this medicine for any condition other than the one for which it was prescribed. Talk to your pediatrician regarding the use of this medicine in children. Special care may be needed. Overdosage: If you think you have taken too much of this medicine contact a poison control center or emergency room at once. NOTE: This medicine is only for you. Do not share this medicine with others. What if I miss a dose? If you miss a dose, use it as soon as you  can. If it is almost time for your next dose, use only that dose. Do not use double or extra doses. What may interact with this medicine? Interactions are not expected. Do not use any other skin products on the affected area without telling your doctor or health care professional. This list may not describe all possible interactions. Give your health care provider a list of all the medicines, herbs, non-prescription drugs, or dietary supplements you use. Also tell them if you smoke, drink alcohol, or use illegal drugs. Some items may interact with your medicine. What should I watch for while using this medicine? Tell your doctor or health care professional if your symptoms do not improve after 3 days. After bathing make sure that your skin is very dry. Fungal infections like moist conditions. Do not walk around barefoot. To help prevent reinfection, wear freshly washed cotton, not synthetic, clothing. What side effects may I notice from receiving this medicine? Side effects that usually do not require medical attention (report to your doctor or health care professional if they continue or are bothersome):  skin irritation This list may not describe all possible side effects. Call your doctor for medical advice about side effects. You may report side effects to FDA at 1-800-FDA-1088. Where should I keep my medicine? Keep out of the reach of children. Store at room temperature 15 to 30 degrees C (59 to 86 degrees F). Throw away any unused medicine after the expiration date. NOTE:  This sheet is a summary. It may not cover all possible information. If you have questions about this medicine, talk to your doctor, pharmacist, or health care provider.  2020 [employer] (2015-06-13 16:28:30)

## 2018-12-22 ENCOUNTER — Other Ambulatory Visit: Payer: Self-pay | Admitting: Podiatry

## 2018-12-22 ENCOUNTER — Ambulatory Visit (INDEPENDENT_AMBULATORY_CARE_PROVIDER_SITE_OTHER): Payer: Medicare HMO

## 2018-12-22 ENCOUNTER — Encounter: Payer: Self-pay | Admitting: Podiatry

## 2018-12-22 ENCOUNTER — Ambulatory Visit: Payer: Medicare HMO

## 2018-12-22 ENCOUNTER — Ambulatory Visit: Payer: Medicare HMO | Admitting: Podiatry

## 2018-12-22 VITALS — Temp 97.3°F

## 2018-12-22 DIAGNOSIS — M778 Other enthesopathies, not elsewhere classified: Secondary | ICD-10-CM

## 2018-12-22 DIAGNOSIS — M779 Enthesopathy, unspecified: Secondary | ICD-10-CM

## 2018-12-22 DIAGNOSIS — M79672 Pain in left foot: Secondary | ICD-10-CM

## 2018-12-22 DIAGNOSIS — Q828 Other specified congenital malformations of skin: Secondary | ICD-10-CM | POA: Diagnosis not present

## 2018-12-23 NOTE — Progress Notes (Signed)
Subjective:   Patient ID: [name], female   DOB: 73 y.o.   MRN: [id number]   HPI Patient states has developed a lot of pain around the outside of the left foot and also has a lesion formation that is chronic   ROS      Objective:  Physical Exam  Inflammatory capsulitis left fifth MPJ with pain fluid buildup and lesion formation x2 plantar aspect left foot     Assessment:  Pain that is palpated to the fifth metatarsal left along with lesion formation that are painful when palpated     Plan:  Sterile prep injected the capsule left 3 mg Dexasone Kenalog 5 mg Xylocaine and debrided lesions

## 2018-12-29 ENCOUNTER — Ambulatory Visit: Payer: Medicare HMO | Admitting: Gynecology

## 2019-01-12 ENCOUNTER — Ambulatory Visit: Payer: Medicare HMO | Admitting: Gynecology

## 2019-01-23 NOTE — Progress Notes (Signed)
Consult Note: Gyn-Onc   [name] 73 y.o. female  Chief Complaint  Patient presents with  . Follow-up    Assessment : Long-standing history of Paget's disease of the vulva. We will follow-up in the results of her vulvar biopsy from today.  Will notify her of the results and determine her disposition pending it.  Plan:      HPI:  Patient is a 73 year old with a history of a partial simple vulvectomy in February of 2012 for vulvar Paget's disease. Some of the surgical margins were positive. Her postoperative course was uncomplicated. She was last seen by our service in 12/14. At that time there was no evidence of any lesions consistent with Paget's. However, there is considerable amount of excoriations throughout the entire vulvar and medial thighs consistent with chronic vulvitis.In December 2013 she had a hernia repair due to bowel obstruction by Dr. Excell Seltzer with permanent mesh. In May 2013 she also had a TVT bladder by Dr. Everett Graff. She does continue to wear a pad and also takes Detrol twice daily but states that her urinary leakage is markedly improved. She described the vulvar irritation is intermittent itching with burning and that it has increased over time as has the pruritus. She does use a steroid cream twice daily and does scratch her vulva.   She underwent a wide local excision of the left vulva on January 2 of year 2014. Operative findings included 2 separate 3 x 3 and 1.2 cm lesions consistent with Paget's on the left.  Pathology revealed 2 foci of extramammary Paget's disease extending to the left inked margin in the right and anterior tip margins at multiple foci. The posterior margin was negative for malignancy.   I performed a biopsy of this it was consistent with recurrent Paget's disease. On 11/23/2012 showed a repeat wide local excision of vulva x2 with the posterior fourchette biopsy. Findings revealed along the left labia minora/vagina is a Paget's disease  measuring approximately 2.5 x 1 cm in the superior aspect of her prior left vulvar excision. The inferior aspect of a similar lesion. 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 On the left perineal body the area is somewhat excoriated pale. Biopsy was performed in this area.  Pathology revealed:  1. Vulva, biopsy, left inferior - EXTRAMAMMARY PAGET DISEASE EXTENDING TO THE EDGES OF THE BIOPSY. 2. Vulva, excision, left superior lesion suture marks 1200 - EXTRAMAMMARY PAGET DISEASE. - MARGINS NOT INVOLVED. - CLOSEST MARGIN 9 O'CLOCK, LESS THAN 0.1 CM. 3. Vulva, excision, left inferior excision suture marks 1200 - EXTRAMAMMARY PAGET DISEASE, 12 O'CLOCK MARGIN FOCALLY INVOLVED. - REMAINING MARGINS CLEAR.  We have discussed repeating surgery however she had multiple orthopedic issues had broken ankle. She was seen by [name] in December 2014. Exam at that time was fairly unremarkable.  I had seen her in January of 2015. At that time exam was concerning for Paget's.   On 08/02/2013 she underwent wide local excisions of the vulva x2.  Operative findings included pale pink raised lesion measuring 1 x 0.5 cm the left upper vulva near the prior excision site. There is a pale raised lesion measuring 1.5 x 1 cm on the left crossing midline perineal body.  Pathology revealed left superior vulvar excision with extramammary Paget's disease extending to the 9:00 margin. The other margins were negative for tumor. Of the left of midline lesion she extramammary Paget's disease extending to the 12:00 margin and focally at the  6:00 margin but otherwise negative margins. The patient at that time discussed with [place] that she was having some right lower quadrant pain. Her exam is very difficult secondary to habitus. There ultrasound was obtained. Ultrasound revealed:  FINDINGS:  Uterus Measurements: 4.6 x 3.5 x 3.8 cm. 2.5 x 1.7 x 1.6 cm uterine fibroid. This is present in the  right lateral aspect of the body uterus.  Endometrium Thickness: 6.1 mm. No focal abnormality visualized.  Right ovary: No focal abnormality.  Left ovary :No focal abnormality.  Other findings: No free fluid  IMPRESSION:  Uterine fibroid otherwise negative exam.  I saw her March 16, 2014 at which time there was an area on the left vulva with hyperkeratosis. A biopsy of that area was performed consistent with Paget's disease. On March 29, 2014 she underwent wide local excision of the vulva. On exam she had a 1 cm left vulvar lesion just below the left labia minora. She underwent a wide local excision. He received a 3.7 x 1.7 x 0.3 cm lesion. There was extramammary Paget's disease extending to the 6 to 9:00 margin in the 12:00 margin. This is despite grossly negative margins on physical examination.   She had an excision in November that revealed Diagnosis Vulva, excision, w/stitch @ 1200 - EXTRAMAMMARY PAGET'S DISEASE, EXTENDING TO THE 6-9 O'CLOCK MARGIN AND 12 O'CLOCK PERPENDICULAR MARGIN.  I saw her in February 2016 at which time there was concern for recurrent disease. She decided to proceed with Aldara therapy. She used for approximate 4 weeks and then decided to stop it secondary to the expense of the medication. She then started using the clobetasol. While she was using the Aldara all the pruritus in her vulva resolved itself. Since she stopped it and was using the steroid cream the pruritus returned.  I saw her in May 2016 at which time she has small area consistent with Paget's on the right labia minora and a larger patch on the left. Biopsy on the right revealed extramammary Paget's. After discussion she wished to try the Aldara again. She called and requested an earlier appointment as for the last 2-3 weeks had increased itching and has been scratching so much that she's had some bleeding. I last saw her on July 6. At that time she had been having a lot of symptoms of pruritus and  scratching a great deal. Vulvar examination at that time revealed a fairly macerated vulva and it was possible to discern what could be related to Paget's, effect of the Aldara therapy, trauma from scratching. She was asked to stop the Aldara therapy and use a low-dose daily cream. We asked her to refrain from scratching come in today for evaluation.  I saw her again December 26, 2104 at which time there was evidence clearly a Paget's disease and she scheduled for surgery. On January 24, 2015 she underwent bilateral wide local excisions of the vulva. On the right vulva there was a 2 cm patch of Paget's disease in the mid labia majora. On the left side. The remaining labia minora in the area prior to the excision are smaller lesion consistent with Paget's disease.  Diagnosis 1. Vulva, excision, left wide excision - EXTRAMAMMARY PAGET'S DISEASE EXTENDING TO THE 12 O'CLOCK, 3 O'CLOCK, 6 O'CLOCK AND 9 O'CLOCK MARGINS. 2. Vulva, excision, right wide excision - EXTRAMAMMARY PAGET'S EXTENDING TO THE 3 O'CLOCK MARGIN AND THE 6 O'CLOCK POLE.  Please refer to the photograph in the operative note from Dr. Skeet Latch for complete  mapping of her lesion. She still having a little bit of spotting from the biopsy sites and some pain.  The patient initially underwent partial simple vulvectomy in February 2012 for vulvar Paget's disease. Some of her surgical margins were positive. She had an uncomplicated post operative course. She had some evidence of vulvar irritation afterward with symptoms of pruritus and burning that increased over time and for which she was using a steroid cream twice daily. She underwent wide local excision of the left vulva in January 2014. Findings at the time of surgery were to areas consistent with Paget's on the left, one measuring 3 x 3 cm and one measuring 1.2 cm pathology revealed 2 foci of extramammary Paget's disease extending to the left intact margin in the right and anterior tip margins  at multiple foci. The patient was seen for follow-up with recurrent Paget's disease noted. In July 2014, she underwent repeat wide local excision of 2 areas in the posterior fourchette biopsy. Findings were an area along the left labium minora measuring 2.5 x 1 cm at the superior aspect of her prior left vulvar excision. There was a similar lesion at the inferior aspect. It appeared that the patient also had lichen sclerosis of the right vulva. Left perineal body biopsy was performed secondary to some noted excoriation. Pathology from the 2 excisions showed extramammary Paget's disease, margins on left superior lesion were negative, margin at 12:00 on inferior lesion focally involved. Perineal body biopsy positive for Paget's disease.   The patient then underwent several orthopedic surgeries and ultimately was taken back to the operating room in March 2015 for 2 wide local excisions of the vulva. Findings at that time included a pill raised pink lesion measuring 1 x 0.5 cm of the left upper vulva near the prior excision site and a second pill raised lesion measuring 1.5 x 1 cm on the left crossing midline perineal body. Pathology showed that the left superior vulvar excision was Paget's disease extending to the 9:00 margin. Other margins were negative. The left midline lesion also was Paget's disease extending to the 12:00 margin and focally to the 6:00 margin. Patient was then seen back in October 2015 at which time there was an area on the left vulva with hyperkeratosis. A biopsy of that area confirmed Paget's disease. Patient underwent repeat wide local excision of the vulva on March 29, 2014. Findings were a 1 cm left vulvar lesion just below the left labium minora. Pathology showed Paget's disease extending to the 6, 9, and 12:00 margins. This was despite grossly negative margins on physical exam.  Patient was next seen in February 2016 at which time there was concern for recurrent disease. She decided to  proceed with Aldara therapy which she used for 4 weeks and then stopped it secondary to the expense of the medication. Started using clobetasol. Her pruritus stopped with Aldara, but unfortunately returned when she switched to clobetasol. In May 2016 she was seen and had an area that was smaller still consistent with Paget's disease on the right labia minora and a larger patch on the left. Biopsy on the right revealed extramammary Paget's. She opted to try Aldara again. Given evidence of recurrent disease she underwent bilateral wide local excision of the vulva at the end of August 2016. Both wide local excision specimens were positive for Paget's disease with extension to focal margins on each.  Most recently, the patient was taken to the operating room on 01/31/2016 for preoperative mapping of her  lesion for her planned wide local excision of the left vulva and smaller excision on the right. Operative findings at that time were notable for surgically absent inferior aspect of the left labium minora and majora. Multiple areas with white flaky skin changes were biopsied and the abnormal-appearing tissue. Changes noted in the area of the clitoris and perineum and at the introitus just distal to the hymen. Biopsies were collected on all of the sites.   10/17 Findings: 1. Surgical absent left labia majorum inferiorly from prior vulvectomies.  2. Focusing on the left vulva, there was gross disease incorporating the prior surgical site, characterized by patchy white and erythematous epithelium. The affected area spanned from the perineal body to 5-6cm laterally. Prior mapping biopsy sites x3 were well healed and visible and surrounded by what appeared to be grossly normal appearing skin. Superiorly on the left labia majorum, there was affected epithelium with a similar appearing patchy white, erythematous epithelium.  3. Focusing of the right vulva, there was less extensive disease. Affected area extended from the  perineal body to roughly 3cm laterally with minimal involvement of the right labia majorum.  4. The urethra, anus, and clitoris were spared.  5. A rectal exam was performed during the procedure to confirm protection of the sphincter, which was intact at the conclusion of our case.  6. A rectocele was notable 3cm into the vagina.   Pathology: Final Diagnosis  A: Vulva, right and left, vulvectomy - (Recurrent/persisent) Paget disease  - Disease size approximately 8.3 x 4.6 cm  - Disease confined to the epidermis - Multiple epidermal margins are positive for disease: 6 o'clock to 11 o'clock (per original orientation)  - Deep margin free of involvement  Interval History:  She was last see by Dr. Fermin Schwab in 1/20 with no evidence of recurrent disease.  I saw her at the end of July 2020. She had such extensive candida at that time that I could not evaluate for Paget's. She was prescribed nystatin and comes in today for a recheck.  She states her vulvar itching is much better than it was it took about 2 weeks to get better but she still has a few patches for which she is using her nystatin.  She and her husband have planted their fall garden.  She is otherwise doing well.  Of course wanting the pandemic to be over so she could get on with life and start going out to dinner and to restaurants.  She otherwise has no complaints   Allergies  Allergen Reactions  . Hydromorphone Hcl Itching and Rash    Per CRNA, pt tol Fentanyl IV for OR case, no issues reported to rec RN in PACU, no immediate PACU issues noted as well upon arrival  . Demerol  [Meperidine Hcl] Other (See Comments)    "Knocks me out"  . Other Other (See Comments)    Blisters  . Codeine Other (See Comments)    hallucinations Hyper/ Hallucinations   . Demerol [Meperidine] Other (See Comments)    "Knocks me out"  . Diazepam Other (See Comments)    Extreme sedation; patient does not recall taking diazepam (12/07/14)  . Tape  Hives and Other (See Comments)    "thick clear plastic tape" causes blisters Blisters Blisters  . Macrobid [Nitrofurantoin] Rash  . Morphine And Related Itching and Rash    Per CRNA, pt tol Fentanyl IV for OR case, no issues reported to rec RN in PACU, no immediate PACU issues noted as  well upon arrival  . Penicillins Hives and Swelling  . Valisone [Betamethasone] Rash    Past Medical History:  Diagnosis Date  . Anxiety   . Arthritis   . Asthma   . Chronic constipation   . Depression   . Fibromyalgia   . GERD (gastroesophageal reflux disease)   . 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 History of colon polyps    2007 hyperplagia  . History of esophageal dilatation    2008  . History of peptic ulcer   . History of small bowel obstruction    2012  . Hypothyroidism   . Macular degeneration of right eye   . Numbness of left foot    4 toes  . Paget's disease of vulva   . Personal history of chemotherapy 1997  . Personal history of radiation therapy 2005  . PONV (postoperative nausea and vomiting)    and HARD TO WAKE  . Seasonal allergies   . SUI (stress urinary incontinence, female)   . Wears glasses     Past Surgical History:  Procedure Laterality Date  . BLADDER SUSPENSION  10/13/2011   Procedure: TRANSVAGINAL TAPE (TVT) PROCEDURE;  Surgeon: Delice Lesch, MD;  Location: [facility];  Service: Gynecology;  Laterality: N/A;  . BREAST BIOPSY Left 03/19/2004  . BREAST LUMPECTOMY Left 03-27-2004  . CARDIAC CATHETERIZATION  10-30-2000  dr Wynonia Lawman   normal coronary arteries and LVF/  ef 65-70%  . CLOSED MANIPULATION  W/ OPEN REDUCTION EXCHANGE TIBIAL FEMORAL BEARING POST TOTAL LEFT KNEE  04-14-2001  . CYSTOSCOPY  10/13/2011   Procedure: CYSTOSCOPY;  Surgeon: Delice Lesch, MD;  Location: [facility];  Service: Gynecology;  Laterality:  Bilateral;  . DILATION AND CURETTAGE OF UTERUS  1968  . FOOT SURGERY Left 02-01-2009   TRIPLE ARTHRODESIS  . FOOT SURGERY Left 11/14   I&D left foot with woundvac placement  . INCISIONAL HERNIA REPAIR  05/05/2011   Procedure: LAPAROSCOPIC INCISIONAL HERNIA;  Surgeon: Edward Jolly, MD;  Location: [facility];  Service: General;  Laterality: N/A;  LAPAROSCOPIC REPAIR INCARCERATED INCISIONAL HERNIA  WITH MESH  . KNEE ARTHROSCOPY Bilateral left [id number] & 04-1992/   right [phone]  . LAPAROSCOPIC CHOLECYSTECTOMY  06-21-2010  . LEFT HEART CATH AND CORONARY ANGIOGRAPHY N/A 04/13/2017   Procedure: LEFT HEART CATH AND CORONARY ANGIOGRAPHY;  Surgeon: Jettie Booze, MD;  Location: [facility];  Service: Cardiovascular;  Laterality: N/A;  . LUMBAR SPINE SURGERY  1998   L5 -- S1  . MASTECTOMY Right 1997   W/  LYMPH NODE DISSECTION AND RECONSTRUCTION WITH IMPLANTS AND EXPANDER  . NEGATIVE SLEEP STUDY  12/26/2015  . PORT-A-CATH PLACEMENT AND REMOVAL  1997  . REMOVAL RIGHT BREAST IMPLANT AND CAPSULECTOMY  06-03-1999  . REVISION TOTAL KNEE ARTHROPLASTY Left 07-07-2005  &  12-10-2001   12-10-2001  REMOVAL TOTAL KNEE/ I&D / INSERTION ANTIBIOTIC SPACER  . REVISION TOTAL KNEE ARTHROPLASTY Right 07-07-2005  . ROTATOR CUFF REPAIR Right   . TENDON REPAIR RIGHT RING FINGER  2011  . THUMB TRIGGER RELEASE Right 1993  . THYROIDECTOMY, PARTIAL  1983  . TONSILLECTOMY  1968  . TOTAL KNEE ARTHROPLASTY Bilateral right 05-04-2000/  left 03-29-2001  . VULVECTOMY  05/27/2012   Procedure: WIDE EXCISION VULVECTOMY;  Surgeon: Imagene Gurney A. Alycia Rossetti, MD;  Location:  [facility];  Service: Gynecology;  Laterality: N/A;  Wide Local Excision of Vulva   . VULVECTOMY N/A 11/23/2012   Procedure: WIDE LOCAL EXCISION OF THE VULVA;  Surgeon: Imagene Gurney A. Alycia Rossetti, MD;  Location: [facility];  Service: Gynecology;  Laterality: N/A;  left inferior vulvar biopsy excision left superior lesion left inferior vulvar excision  . VULVECTOMY N/A  08/02/2013   Procedure: WIDE LOCAL  EXCISION VULVA;  Surgeon: Imagene Gurney A. Alycia Rossetti, MD;  Location: [facility];  Service: Gynecology;  Laterality: N/A;  . VULVECTOMY Left 03/29/2014   Procedure: WIDE LOCAL EXCISION OF LEFT VULVA ;  Surgeon: Imagene Gurney A. Alycia Rossetti, MD;  Location: [facility];  Service: Gynecology;  Laterality: Left;  Marland Kitchen VULVECTOMY Bilateral 01/24/2015   Procedure: WIDE LOCAL EXCISION VULVA;  Surgeon: [name], MD;  Location: [facility];  Service: Gynecology;  Laterality: Bilateral;  . VULVECTOMY PARTIAL  07-16-2010    Current Outpatient Medications  Medication Sig Dispense Refill  . albuterol (PROVENTIL HFA;VENTOLIN HFA) 108 (90 BASE) MCG/ACT inhaler Inhale 2 puffs into the lungs every 4 (four) hours as needed for wheezing or shortness of breath. Reported on 08/01/2015    . aspirin EC 81 MG EC tablet Take 1 tablet (81 mg total) daily by mouth. 30 tablet 0  . atorvastatin (LIPITOR) 40 MG tablet Take 1 tablet (40 mg total) daily at 6 PM by mouth. 30 tablet 0  . baclofen (LIORESAL) 10 MG tablet TAKE 1 TABLET BY MOUTH TWICE A DAY 180 tablet 0  . budesonide-formoterol (SYMBICORT) 160-4.5 MCG/ACT inhaler Inhale into the lungs.    Marland Kitchen buPROPion (WELLBUTRIN XL) 150 MG 24 hr tablet Take 150 mg by mouth every morning.     . Cholecalciferol (VITAMIN D3) 1000 UNITS CAPS Take 2 capsules by mouth daily.    . citalopram (CELEXA) 40 MG tablet Take 40 mg by mouth every morning.     . clindamycin (CLEOCIN) 150 MG capsule Take 4 tablets 30 minutesprior to dental procedure.    . furosemide (LASIX) 40 MG tablet Take 40 mg by mouth 2 (two) times daily.    Marland Kitchen HYDROcodone-homatropine (HYCODAN) 5-1.5 MG/5ML syrup TAKE 2.5MLS TO 5 MLS BY MOUTH AT BEDTIME  0  . hydrOXYzine (ATARAX/VISTARIL) 25 MG tablet Take 1 tablet (25 mg total) by mouth 2 (two) times daily as needed for itching. 30 tablet 0  . ibandronate (BONIVA) 150 MG tablet Take 150 mg every 30 (thirty) days by mouth. Take in the morning  with a full glass of water, on an empty stomach, and do not take anything else by mouth or lie down for the next 30 min.    Marland Kitchen ipratropium-albuterol (DUONEB) 0.5-2.5 (3) MG/3ML SOLN ipratropium 0.5 mg-albuterol 3 mg (2.5 mg base)/3 mL nebulization soln  INHALE 3 MLS (1 VIAL) BY NEBULIZATION EVERY 4 (HOURS AS NEEDED FOR WHEEZING.    Marland Kitchen ketoconazole (NIZORAL) 2 % cream Apply topically.    Marland Kitchen levothyroxine (SYNTHROID) 175 MCG tablet TAKE 1 TABLET BY MOUTH EVERY DAY    . montelukast (SINGULAIR) 10 MG tablet Take 10 mg daily by mouth.     . Multiple Vitamin (MULTIVITAMIN WITH MINERALS) TABS tablet Take 1 tablet daily by mouth.    . nystatin (MYCOSTATIN/NYSTOP) powder Apply topically 4 (four) times daily. 60 g 6  . nystatin cream (MYCOSTATIN) Apply 1 application topically 2 (two) times daily. 30 g 6  . Omega-3 1000 MG CAPS Take 2 g daily by mouth.     . pantoprazole (PROTONIX)  40 MG tablet Take 40 mg by mouth every morning.     . polyvinyl alcohol (LIQUIFILM TEARS) 1.4 % ophthalmic solution Place 1 drop into both eyes as needed for dry eyes.    . potassium chloride (K-DUR) 10 MEQ tablet TAKE 1 TABLET BY MOUTH EVERY DAY    . traMADol (ULTRAM) 50 MG tablet 1 tablet 3 times a day by mouth PRN for pain 45 tablet 0  . Vitamins/Minerals TABS Take by mouth.     No current facility-administered medications for this visit.     Social History   Socioeconomic History  . Marital status: Married    Spouse name: Not on file  . Number of children: Not on file  . Years of education: Not on file  . Highest education level: Not on file  Occupational History  . Not on file  Social Needs  . Financial resource strain: Not on file  . Food insecurity    Worry: Not on file    Inability: Not on file  . Transportation needs    Medical: Not on file    Non-medical: Not on file  Tobacco Use  . Smoking status: Former Smoker    Packs/day: 2.00    Years: 41.00    Pack years: 82.00    Types: Cigarettes    Quit  date: 06/05/1999    Years since quitting: 19.6  . Smokeless tobacco: Never Used  Substance and Sexual Activity  . Alcohol use: No  . Drug use: No  . Sexual activity: Not on file  Lifestyle  . Physical activity    Days per week: Not on file    Minutes per session: Not on file  . Stress: Not on file  Relationships  . Social Herbalist on phone: Not on file    Gets together: Not on file    Attends religious service: Not on file    Active member of club or organization: Not on file    Attends meetings of clubs or organizations: Not on file    Relationship status: Not on file  . Intimate partner violence    Fear of current or ex partner: Not on file    Emotionally abused: Not on file    Physically abused: Not on file    Forced sexual activity: Not on file  Other Topics Concern  . Not on file  Social History Narrative  . Not on file    Family History  Problem Relation Age of Onset  . Cancer Father 34       lung and kidney   . Alzheimer's disease Mother   . Heart attack Maternal Grandmother   . Alcohol abuse Brother   . Breast cancer Paternal Aunt   . Breast cancer Cousin   . Breast cancer Cousin    Vitals: Blood pressure (!) 137/59, pulse 91, temperature 98.5 F (36.9 C), temperature source Oral, resp. rate 18, height 5\' 5"  (1.651 m), weight 242 lb (109.8 kg), SpO2 97 %.  Physical Exam: General : The patient is a healthy woman in no acute distress.   Groins: The right groin is without lymphadenopathy and there is no erythema.  The left groin has no adenopathy but throughout the left groin, the lower abdominal wall near the pannus on the right thigh has continued condyloma though markedly improved.  Nystatin powder is visualized.  Vulva: There is a large patch measuring approximately 3 x 4 cm on the labia majora going inside the  vagina.  There is another smaller plaque on the right labia minora measuring approximately 2 x 1 cm.  After obtaining the patient's verbal  consent the area was cleansed with Betadine.  0.8 mL's of 1% plain lidocaine was injected.  Using a 4 mm punch biopsy vulvar biopsy was performed.  Silver nitrate was used for hemostasis.  She tolerated the procedure well.     [name] A., MD 01/25/2019, 10:57 AM

## 2019-01-25 ENCOUNTER — Inpatient Hospital Stay: Payer: Medicare HMO | Attending: Gynecology | Admitting: Gynecologic Oncology

## 2019-01-25 ENCOUNTER — Other Ambulatory Visit: Payer: Self-pay

## 2019-01-25 VITALS — BP 147/63 | HR 45 | Temp 97.8°F | Resp 18 | Ht 65.0 in | Wt 245.0 lb

## 2019-01-25 DIAGNOSIS — M797 Fibromyalgia: Secondary | ICD-10-CM | POA: Diagnosis not present

## 2019-01-25 DIAGNOSIS — Z87891 Personal history of nicotine dependence: Secondary | ICD-10-CM | POA: Insufficient documentation

## 2019-01-25 DIAGNOSIS — C519 Malignant neoplasm of vulva, unspecified: Secondary | ICD-10-CM

## 2019-01-25 DIAGNOSIS — Z7951 Long term (current) use of inhaled steroids: Secondary | ICD-10-CM | POA: Insufficient documentation

## 2019-01-25 DIAGNOSIS — Z9221 Personal history of antineoplastic chemotherapy: Secondary | ICD-10-CM | POA: Diagnosis not present

## 2019-01-25 DIAGNOSIS — L280 Lichen simplex chronicus: Secondary | ICD-10-CM | POA: Diagnosis not present

## 2019-01-25 DIAGNOSIS — E039 Hypothyroidism, unspecified: Secondary | ICD-10-CM | POA: Insufficient documentation

## 2019-01-25 DIAGNOSIS — Z923 Personal history of irradiation: Secondary | ICD-10-CM | POA: Insufficient documentation

## 2019-01-25 DIAGNOSIS — F329 Major depressive disorder, single episode, unspecified: Secondary | ICD-10-CM | POA: Diagnosis not present

## 2019-01-25 DIAGNOSIS — N762 Acute vulvitis: Secondary | ICD-10-CM | POA: Insufficient documentation

## 2019-01-25 DIAGNOSIS — F419 Anxiety disorder, unspecified: Secondary | ICD-10-CM | POA: Insufficient documentation

## 2019-01-25 DIAGNOSIS — Z79899 Other long term (current) drug therapy: Secondary | ICD-10-CM | POA: Diagnosis not present

## 2019-01-25 DIAGNOSIS — K219 Gastro-esophageal reflux disease without esophagitis: Secondary | ICD-10-CM | POA: Diagnosis not present

## 2019-01-25 DIAGNOSIS — Z853 Personal history of malignant neoplasm of breast: Secondary | ICD-10-CM | POA: Insufficient documentation

## 2019-01-25 DIAGNOSIS — J45909 Unspecified asthma, uncomplicated: Secondary | ICD-10-CM | POA: Diagnosis not present

## 2019-01-25 DIAGNOSIS — M1990 Unspecified osteoarthritis, unspecified site: Secondary | ICD-10-CM | POA: Diagnosis not present

## 2019-01-25 DIAGNOSIS — Z7982 Long term (current) use of aspirin: Secondary | ICD-10-CM | POA: Diagnosis not present

## 2019-01-25 DIAGNOSIS — C4499 Other specified malignant neoplasm of skin, unspecified: Secondary | ICD-10-CM

## 2019-01-25 NOTE — Addendum Note (Signed)
Addended by: Baruch Merl on: 01/25/2019 11:20 AM   Modules accepted: Orders

## 2019-01-25 NOTE — Patient Instructions (Signed)
We will call you with the results from today.  

## 2019-01-26 DIAGNOSIS — C519 Malignant neoplasm of vulva, unspecified: Secondary | ICD-10-CM | POA: Diagnosis not present

## 2019-02-01 ENCOUNTER — Telehealth: Payer: Self-pay

## 2019-02-01 NOTE — Telephone Encounter (Signed)
Told [name] the results of the biopsy as noted below by [name], NP. Sent information to Dr. Alycia Rossetti for review. Will let patient know Dr. Recommendations . Dr. Alycia Rossetti may call her directly as well. Pt verbalized understanding.

## 2019-02-01 NOTE — Telephone Encounter (Signed)
-----   Message from Dorothyann Gibbs, NP sent at 02/01/2019  9:11 AM EDT ----- Can you let her know that her bx shows pagets?  Also can you email Dr. Alycia Rossetti with the path report and her assessment and plan from her last visit. THanks!

## 2019-02-01 NOTE — Telephone Encounter (Signed)
TC to patient about biopsy results. I recommended aldara but she did not tolerate that she states. She will need a skin graft to cover the site on the left. The right will be a bit more straight forward. She wishes to proceed. Will need to be done at [facility]. She knows that I will call her with details once I speak with the plastic surgeon. PG

## 2019-02-02 ENCOUNTER — Ambulatory Visit: Payer: Medicare HMO | Admitting: Podiatry

## 2019-02-03 ENCOUNTER — Telehealth: Payer: Self-pay | Admitting: Physical Medicine and Rehabilitation

## 2019-02-03 NOTE — Telephone Encounter (Signed)
ok 

## 2019-02-03 NOTE — Telephone Encounter (Signed)
Scheduled for 9/21 at 0845.

## 2019-02-14 ENCOUNTER — Encounter: Payer: Self-pay | Admitting: Physical Medicine and Rehabilitation

## 2019-02-14 ENCOUNTER — Ambulatory Visit (INDEPENDENT_AMBULATORY_CARE_PROVIDER_SITE_OTHER): Payer: Medicare HMO | Admitting: Physical Medicine and Rehabilitation

## 2019-02-14 ENCOUNTER — Ambulatory Visit: Payer: Self-pay

## 2019-02-14 DIAGNOSIS — M25512 Pain in left shoulder: Secondary | ICD-10-CM

## 2019-02-14 DIAGNOSIS — G8929 Other chronic pain: Secondary | ICD-10-CM | POA: Diagnosis not present

## 2019-02-14 MED ORDER — TRIAMCINOLONE ACETONIDE 40 MG/ML IJ SUSP
60.0000 mg | INTRAMUSCULAR | Status: AC | PRN
  Administered 2019-02-14: 60 mg via INTRA_ARTICULAR

## 2019-02-14 MED ORDER — BUPIVACAINE HCL 0.5 % IJ SOLN
3.0000 mL | INTRAMUSCULAR | Status: AC | PRN
  Administered 2019-02-14: 3 mL via INTRA_ARTICULAR

## 2019-02-14 NOTE — Progress Notes (Signed)
   [name] - 73 y.o. female MRN [id number]  Date of birth: 09-06-1945  Office Visit Note: Visit Date: 02/14/2019 PCP: Chesley Noon, MD Referred by: Chesley Noon, MD  Subjective: Chief Complaint  Patient presents with  . Left Shoulder - Pain  . Left Arm - Pain   HPI:  [name] is a 73 y.o. female who comes in today For repeat intra-articular glenohumeral joint injection on the left.  Last injection performed in February gave her quite a bit of relief up until recently.  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  She is followed by Dr. Anderson Malta.  She has worsening with movement and activity.  She reports no new trauma.  Pain is 7 out of 10.  ROS Otherwise per HPI.  Assessment & Plan: Visit Diagnoses:  1. Chronic left shoulder pain     Plan: No additional findings.   Meds & Orders: No orders of the defined types were placed in this encounter.   Orders Placed This Encounter  Procedures  . Large Joint Inj: L glenohumeral  . XR C-ARM NO REPORT    Follow-up: Return if symptoms worsen or fail to improve.   Procedures: Large Joint Inj: L glenohumeral on 02/14/2019 8:55 AM Indications: pain and diagnostic evaluation Details: 22 G 3.5 in needle, fluoroscopy-guided anteromedial approach  Arthrogram: No  Medications: 3 mL bupivacaine 0.5 %; 60 mg triamcinolone acetonide 40 MG/ML Outcome: tolerated well, no immediate complications  There was excellent flow of contrast producing a partial arthrogram of the glenohumeral joint. The patient did have relief of symptoms during the anesthetic phase of the injection. Procedure, treatment alternatives, risks and benefits explained, specific risks discussed. Consent was given by the patient. Immediately prior to procedure a time out was called to verify the correct patient, procedure, equipment, support staff and site/side marked as required. Patient was prepped and draped in the usual sterile  fashion.      No notes on file   Clinical History: No specialty comments available.     Objective:  VS:  HT:    WT:   BMI:     BP:   HR: bpm  TEMP: ( )  RESP:  Physical Exam  Ortho Exam Imaging: Xr C-arm No Report  Result Date: 02/14/2019 Please see Notes tab for imaging impression.

## 2019-02-14 NOTE — Progress Notes (Signed)
 .  Numeric Pain Rating Scale and Functional Assessment Average Pain 7   In the last MONTH (on 0-10 scale) has pain interfered with the following?  1. General activity like being  able to carry out your everyday physical activities such as walking, climbing stairs, carrying groceries, or moving a chair?  Rating(7)  -Dye Allergies.  

## 2019-03-08 ENCOUNTER — Telehealth: Payer: Self-pay | Admitting: *Deleted

## 2019-03-08 NOTE — Telephone Encounter (Signed)
Called and left the patient a message to call the office back. Patient needs a post op appt

## 2019-03-31 NOTE — Progress Notes (Deleted)
Consult Note: Gyn-Onc   ORI EHRESMAN 73 y.o. female  No chief complaint on file.   Assessment : Long-standing history of Paget's disease of the vulva. We will follow-up in the results of her vulvar biopsy from today.  Will notify her of the results and determine her disposition pending it.  Plan:      HPI:  Patient is a 73 year old with a history of a partial simple vulvectomy in February of 2012 for vulvar Paget's disease. Some of the surgical margins were positive. Her postoperative course was uncomplicated. She was last seen by our service in 12/14. At that time there was no evidence of any lesions consistent with Paget's. However, there is considerable amount of excoriations throughout the entire vulvar and medial thighs consistent with chronic vulvitis.In December 2013 she had a hernia repair due to bowel obstruction by Dr. Excell Seltzer with permanent mesh. In May 2013 she also had a TVT bladder by Dr. Everett Graff. She does continue to wear a pad and also takes Detrol twice daily but states that her urinary leakage is markedly improved. She described the vulvar irritation is intermittent itching with burning and that it has increased over time as has the pruritus. She does use a steroid cream twice daily and does scratch her vulva.   She underwent a wide local excision of the left vulva on January 2 of year 2014. Operative findings included 2 separate 3 x 3 and 1.2 cm lesions consistent with Paget's on the left.  Pathology revealed 2 foci of extramammary Paget's disease extending to the left inked margin in the right and anterior tip margins at multiple foci. The posterior margin was negative for malignancy.   I performed a biopsy of this it was consistent with recurrent Paget's disease. On 11/23/2012 showed a repeat wide local excision of vulva x2 with the posterior fourchette biopsy. Findings revealed along the left labia minora/vagina is a Paget's disease measuring approximately 2.5 x  1 cm in the superior aspect of her prior left vulvar excision. The inferior aspect of a similar lesion. 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 On the left perineal body the area is somewhat excoriated pale. Biopsy was performed in this area.  Pathology revealed:  1. Vulva, biopsy, left inferior - EXTRAMAMMARY PAGET DISEASE EXTENDING TO THE EDGES OF THE BIOPSY. 2. Vulva, excision, left superior lesion suture marks 1200 - EXTRAMAMMARY PAGET DISEASE. - MARGINS NOT INVOLVED. - CLOSEST MARGIN 9 O'CLOCK, LESS THAN 0.1 CM. 3. Vulva, excision, left inferior excision suture marks 1200 - EXTRAMAMMARY PAGET DISEASE, 12 O'CLOCK MARGIN FOCALLY INVOLVED. - REMAINING MARGINS CLEAR.  We have discussed repeating surgery however she had multiple orthopedic issues had broken ankle. She was seen by [name] in December 2014. Exam at that time was fairly unremarkable.  I had seen her in January of 2015. At that time exam was concerning for Paget's.   On 08/02/2013 she underwent wide local excisions of the vulva x2.  Operative findings included pale pink raised lesion measuring 1 x 0.5 cm the left upper vulva near the prior excision site. There is a pale raised lesion measuring 1.5 x 1 cm on the left crossing midline perineal body.  Pathology revealed left superior vulvar excision with extramammary Paget's disease extending to the 9:00 margin. The other margins were negative for tumor. Of the left of midline lesion she extramammary Paget's disease extending to the 12:00 margin and focally at the 6:00 margin but otherwise negative  margins. The patient at that time discussed with [place] that she was having some right lower quadrant pain. Her exam is very difficult secondary to habitus. There ultrasound was obtained. Ultrasound revealed:  FINDINGS:  Uterus Measurements: 4.6 x 3.5 x 3.8 cm. 2.5 x 1.7 x 1.6 cm uterine fibroid. This is present in the right lateral aspect of the body  uterus.  Endometrium Thickness: 6.1 mm. No focal abnormality visualized.  Right ovary: No focal abnormality.  Left ovary :No focal abnormality.  Other findings: No free fluid  IMPRESSION:  Uterine fibroid otherwise negative exam.  I saw her March 16, 2014 at which time there was an area on the left vulva with hyperkeratosis. A biopsy of that area was performed consistent with Paget's disease. On March 29, 2014 she underwent wide local excision of the vulva. On exam she had a 1 cm left vulvar lesion just below the left labia minora. She underwent a wide local excision. He received a 3.7 x 1.7 x 0.3 cm lesion. There was extramammary Paget's disease extending to the 6 to 9:00 margin in the 12:00 margin. This is despite grossly negative margins on physical examination.   She had an excision in November that revealed Diagnosis Vulva, excision, w/stitch @ 1200 - EXTRAMAMMARY PAGET'S DISEASE, EXTENDING TO THE 6-9 O'CLOCK MARGIN AND 12 O'CLOCK PERPENDICULAR MARGIN.  I saw her in February 2016 at which time there was concern for recurrent disease. She decided to proceed with Aldara therapy. She used for approximate 4 weeks and then decided to stop it secondary to the expense of the medication. She then started using the clobetasol. While she was using the Aldara all the pruritus in her vulva resolved itself. Since she stopped it and was using the steroid cream the pruritus returned.  I saw her in May 2016 at which time she has small area consistent with Paget's on the right labia minora and a larger patch on the left. Biopsy on the right revealed extramammary Paget's. After discussion she wished to try the Aldara again. She called and requested an earlier appointment as for the last 2-3 weeks had increased itching and has been scratching so much that she's had some bleeding. I last saw her on July 6. At that time she had been having a lot of symptoms of pruritus and scratching a great deal. Vulvar  examination at that time revealed a fairly macerated vulva and it was possible to discern what could be related to Paget's, effect of the Aldara therapy, trauma from scratching. She was asked to stop the Aldara therapy and use a low-dose daily cream. We asked her to refrain from scratching come in today for evaluation.  I saw her again December 26, 2104 at which time there was evidence clearly a Paget's disease and she scheduled for surgery. On January 24, 2015 she underwent bilateral wide local excisions of the vulva. On the right vulva there was a 2 cm patch of Paget's disease in the mid labia majora. On the left side. The remaining labia minora in the area prior to the excision are smaller lesion consistent with Paget's disease.  Diagnosis 1. Vulva, excision, left wide excision - EXTRAMAMMARY PAGET'S DISEASE EXTENDING TO THE 12 O'CLOCK, 3 O'CLOCK, 6 O'CLOCK AND 9 O'CLOCK MARGINS. 2. Vulva, excision, right wide excision - EXTRAMAMMARY PAGET'S EXTENDING TO THE 3 O'CLOCK MARGIN AND THE 6 O'CLOCK POLE.  Please refer to the photograph in the operative note from Dr. Skeet Latch for complete mapping of her lesion. She  still having a little bit of spotting from the biopsy sites and some pain.  The patient initially underwent partial simple vulvectomy in February 2012 for vulvar Paget's disease. Some of her surgical margins were positive. She had an uncomplicated post operative course. She had some evidence of vulvar irritation afterward with symptoms of pruritus and burning that increased over time and for which she was using a steroid cream twice daily. She underwent wide local excision of the left vulva in January 2014. Findings at the time of surgery were to areas consistent with Paget's on the left, one measuring 3 x 3 cm and one measuring 1.2 cm pathology revealed 2 foci of extramammary Paget's disease extending to the left intact margin in the right and anterior tip margins at multiple foci. The patient  was seen for follow-up with recurrent Paget's disease noted. In July 2014, she underwent repeat wide local excision of 2 areas in the posterior fourchette biopsy. Findings were an area along the left labium minora measuring 2.5 x 1 cm at the superior aspect of her prior left vulvar excision. There was a similar lesion at the inferior aspect. It appeared that the patient also had lichen sclerosis of the right vulva. Left perineal body biopsy was performed secondary to some noted excoriation. Pathology from the 2 excisions showed extramammary Paget's disease, margins on left superior lesion were negative, margin at 12:00 on inferior lesion focally involved. Perineal body biopsy positive for Paget's disease.   The patient then underwent several orthopedic surgeries and ultimately was taken back to the operating room in March 2015 for 2 wide local excisions of the vulva. Findings at that time included a pill raised pink lesion measuring 1 x 0.5 cm of the left upper vulva near the prior excision site and a second pill raised lesion measuring 1.5 x 1 cm on the left crossing midline perineal body. Pathology showed that the left superior vulvar excision was Paget's disease extending to the 9:00 margin. Other margins were negative. The left midline lesion also was Paget's disease extending to the 12:00 margin and focally to the 6:00 margin. Patient was then seen back in October 2015 at which time there was an area on the left vulva with hyperkeratosis. A biopsy of that area confirmed Paget's disease. Patient underwent repeat wide local excision of the vulva on March 29, 2014. Findings were a 1 cm left vulvar lesion just below the left labium minora. Pathology showed Paget's disease extending to the 6, 9, and 12:00 margins. This was despite grossly negative margins on physical exam.  Patient was next seen in February 2016 at which time there was concern for recurrent disease. She decided to proceed with Aldara therapy  which she used for 4 weeks and then stopped it secondary to the expense of the medication. Started using clobetasol. Her pruritus stopped with Aldara, but unfortunately returned when she switched to clobetasol. In May 2016 she was seen and had an area that was smaller still consistent with Paget's disease on the right labia minora and a larger patch on the left. Biopsy on the right revealed extramammary Paget's. She opted to try Aldara again. Given evidence of recurrent disease she underwent bilateral wide local excision of the vulva at the end of August 2016. Both wide local excision specimens were positive for Paget's disease with extension to focal margins on each.  Most recently, the patient was taken to the operating room on 01/31/2016 for preoperative mapping of her lesion for her planned wide  local excision of the left vulva and smaller excision on the right. Operative findings at that time were notable for surgically absent inferior aspect of the left labium minora and majora. Multiple areas with white flaky skin changes were biopsied and the abnormal-appearing tissue. Changes noted in the area of the clitoris and perineum and at the introitus just distal to the hymen. Biopsies were collected on all of the sites.   10/17 Findings: 1. Surgical absent left labia majorum inferiorly from prior vulvectomies.  2. Focusing on the left vulva, there was gross disease incorporating the prior surgical site, characterized by patchy white and erythematous epithelium. The affected area spanned from the perineal body to 5-6cm laterally. Prior mapping biopsy sites x3 were well healed and visible and surrounded by what appeared to be grossly normal appearing skin. Superiorly on the left labia majorum, there was affected epithelium with a similar appearing patchy white, erythematous epithelium.  3. Focusing of the right vulva, there was less extensive disease. Affected area extended from the perineal body to roughly 3cm  laterally with minimal involvement of the right labia majorum.  4. The urethra, anus, and clitoris were spared.  5. A rectal exam was performed during the procedure to confirm protection of the sphincter, which was intact at the conclusion of our case.  6. A rectocele was notable 3cm into the vagina.   Pathology: Final Diagnosis  A: Vulva, right and left, vulvectomy - (Recurrent/persisent) Paget disease  - Disease size approximately 8.3 x 4.6 cm  - Disease confined to the epidermis - Multiple epidermal margins are positive for disease: 6 o'clock to 11 o'clock (per original orientation)  - Deep margin free of involvement  Interval History:  She was last see by Dr. Fermin Schwab in 1/20 with no evidence of recurrent disease.  I saw her at the end of July 2020. She had such extensive candida at that time that I could not evaluate for Paget's. She was prescribed nystatin and comes in today for a recheck.  She states her vulvar itching is much better than it was it took about 2 weeks to get better but she still has a few patches for which she is using her nystatin.  She and her husband have planted their fall garden.  She is otherwise doing well.  Of course wanting the pandemic to be over so she could get on with life and start going out to dinner and to restaurants.  She otherwise has no complaints   Allergies  Allergen Reactions  . Hydromorphone Hcl Itching and Rash    Per CRNA, pt tol Fentanyl IV for OR case, no issues reported to rec RN in PACU, no immediate PACU issues noted as well upon arrival  . Demerol  [Meperidine Hcl] Other (See Comments)    "Knocks me out"  . Other Other (See Comments)    Blisters  . Codeine Other (See Comments)    hallucinations Hyper/ Hallucinations   . Demerol [Meperidine] Other (See Comments)    "Knocks me out"  . Diazepam Other (See Comments)    Extreme sedation; patient does not recall taking diazepam (12/07/14)  . Tape Hives and Other (See Comments)     "thick clear plastic tape" causes blisters Blisters Blisters  . Macrobid [Nitrofurantoin] Rash  . Morphine And Related Itching and Rash    Per CRNA, pt tol Fentanyl IV for OR case, no issues reported to rec RN in PACU, no immediate PACU issues noted as well upon arrival  .  Penicillins Hives and Swelling  . Valisone [Betamethasone] Rash    Past Medical History:  Diagnosis Date  . Anxiety   . Arthritis   . Asthma   . Chronic constipation   . Depression   . Fibromyalgia   . GERD (gastroesophageal reflux disease)   . 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 History of colon polyps    2007 hyperplagia  . History of esophageal dilatation    2008  . History of peptic ulcer   . History of small bowel obstruction    2012  . Hypothyroidism   . Macular degeneration of right eye   . Numbness of left foot    4 toes  . Paget's disease of vulva   . Personal history of chemotherapy 1997  . Personal history of radiation therapy 2005  . PONV (postoperative nausea and vomiting)    and HARD TO WAKE  . Seasonal allergies   . SUI (stress urinary incontinence, female)   . Wears glasses     Past Surgical History:  Procedure Laterality Date  . BLADDER SUSPENSION  10/13/2011   Procedure: TRANSVAGINAL TAPE (TVT) PROCEDURE;  Surgeon: Delice Lesch, MD;  Location: [facility];  Service: Gynecology;  Laterality: N/A;  . BREAST BIOPSY Left 03/19/2004  . BREAST LUMPECTOMY Left 03-27-2004  . CARDIAC CATHETERIZATION  10-30-2000  dr Wynonia Lawman   normal coronary arteries and LVF/  ef 65-70%  . CLOSED MANIPULATION  W/ OPEN REDUCTION EXCHANGE TIBIAL FEMORAL BEARING POST TOTAL LEFT KNEE  04-14-2001  . CYSTOSCOPY  10/13/2011   Procedure: CYSTOSCOPY;  Surgeon: Delice Lesch, MD;  Location: [facility];  Service: Gynecology;  Laterality: Bilateral;  . DILATION AND  CURETTAGE OF UTERUS  1968  . FOOT SURGERY Left 02-01-2009   TRIPLE ARTHRODESIS  . FOOT SURGERY Left 11/14   I&D left foot with woundvac placement  . INCISIONAL HERNIA REPAIR  05/05/2011   Procedure: LAPAROSCOPIC INCISIONAL HERNIA;  Surgeon: Edward Jolly, MD;  Location: [facility];  Service: General;  Laterality: N/A;  LAPAROSCOPIC REPAIR INCARCERATED INCISIONAL HERNIA  WITH MESH  . KNEE ARTHROSCOPY Bilateral left [id number] & 04-1992/   right [phone]  . LAPAROSCOPIC CHOLECYSTECTOMY  06-21-2010  . LEFT HEART CATH AND CORONARY ANGIOGRAPHY N/A 04/13/2017   Procedure: LEFT HEART CATH AND CORONARY ANGIOGRAPHY;  Surgeon: Jettie Booze, MD;  Location: [facility];  Service: Cardiovascular;  Laterality: N/A;  . LUMBAR SPINE SURGERY  1998   L5 -- S1  . MASTECTOMY Right 1997   W/  LYMPH NODE DISSECTION AND RECONSTRUCTION WITH IMPLANTS AND EXPANDER  . NEGATIVE SLEEP STUDY  12/26/2015  . PORT-A-CATH PLACEMENT AND REMOVAL  1997  . REMOVAL RIGHT BREAST IMPLANT AND CAPSULECTOMY  06-03-1999  . REVISION TOTAL KNEE ARTHROPLASTY Left 07-07-2005  &  12-10-2001   12-10-2001  REMOVAL TOTAL KNEE/ I&D / INSERTION ANTIBIOTIC SPACER  . REVISION TOTAL KNEE ARTHROPLASTY Right 07-07-2005  . ROTATOR CUFF REPAIR Right   . TENDON REPAIR RIGHT RING FINGER  2011  . THUMB TRIGGER RELEASE Right 1993  . THYROIDECTOMY, PARTIAL  1983  . TONSILLECTOMY  1968  . TOTAL KNEE ARTHROPLASTY Bilateral right 05-04-2000/  left 03-29-2001  . VULVECTOMY  05/27/2012   Procedure: WIDE EXCISION VULVECTOMY;  Surgeon: Imagene Gurney A. Alycia Rossetti, MD;  Location: [facility];  Service: Gynecology;  Laterality: N/A;  Wide Local Excision of Vulva   . VULVECTOMY N/A 11/23/2012   Procedure: WIDE LOCAL EXCISION OF THE VULVA;  Surgeon: Imagene Gurney A. Alycia Rossetti, MD;  Location: [facility];  Service: Gynecology;  Laterality: N/A;  left inferior vulvar biopsy excision left superior lesion left inferior vulvar excision  . VULVECTOMY N/A 08/02/2013   Procedure: WIDE LOCAL   EXCISION VULVA;  Surgeon: Imagene Gurney A. Alycia Rossetti, MD;  Location: [facility];  Service: Gynecology;  Laterality: N/A;  . VULVECTOMY Left 03/29/2014   Procedure: WIDE LOCAL EXCISION OF LEFT VULVA ;  Surgeon: Imagene Gurney A. Alycia Rossetti, MD;  Location: [facility];  Service: Gynecology;  Laterality: Left;  Marland Kitchen VULVECTOMY Bilateral 01/24/2015   Procedure: WIDE LOCAL EXCISION VULVA;  Surgeon: [name], MD;  Location: [facility];  Service: Gynecology;  Laterality: Bilateral;  . VULVECTOMY PARTIAL  07-16-2010    Current Outpatient Medications  Medication Sig Dispense Refill  . albuterol (PROVENTIL HFA;VENTOLIN HFA) 108 (90 BASE) MCG/ACT inhaler Inhale 2 puffs into the lungs every 4 (four) hours as needed for wheezing or shortness of breath. Reported on 08/01/2015    . aspirin EC 81 MG EC tablet Take 1 tablet (81 mg total) daily by mouth. 30 tablet 0  . atorvastatin (LIPITOR) 40 MG tablet Take 1 tablet (40 mg total) daily at 6 PM by mouth. 30 tablet 0  . baclofen (LIORESAL) 10 MG tablet TAKE 1 TABLET BY MOUTH TWICE A DAY 180 tablet 0  . budesonide-formoterol (SYMBICORT) 160-4.5 MCG/ACT inhaler Inhale into the lungs.    Marland Kitchen buPROPion (WELLBUTRIN XL) 150 MG 24 hr tablet Take 150 mg by mouth every morning.     . Cholecalciferol (VITAMIN D3) 1000 UNITS CAPS Take 2 capsules by mouth daily.    . citalopram (CELEXA) 40 MG tablet Take 40 mg by mouth every morning.     . clindamycin (CLEOCIN) 150 MG capsule Take 4 tablets 30 minutesprior to dental procedure.    . furosemide (LASIX) 40 MG tablet Take 40 mg by mouth 2 (two) times daily.    Marland Kitchen HYDROcodone-homatropine (HYCODAN) 5-1.5 MG/5ML syrup TAKE 2.5MLS TO 5 MLS BY MOUTH AT BEDTIME  0  . hydrOXYzine (ATARAX/VISTARIL) 25 MG tablet Take 1 tablet (25 mg total) by mouth 2 (two) times daily as needed for itching. 30 tablet 0  . ibandronate (BONIVA) 150 MG tablet Take 150 mg every 30 (thirty) days by mouth. Take in the morning with a full glass of water, on an  empty stomach, and do not take anything else by mouth or lie down for the next 30 min.    Marland Kitchen ipratropium-albuterol (DUONEB) 0.5-2.5 (3) MG/3ML SOLN ipratropium 0.5 mg-albuterol 3 mg (2.5 mg base)/3 mL nebulization soln  INHALE 3 MLS (1 VIAL) BY NEBULIZATION EVERY 4 (HOURS AS NEEDED FOR WHEEZING.    Marland Kitchen ketoconazole (NIZORAL) 2 % cream Apply topically.    Marland Kitchen levothyroxine (SYNTHROID) 175 MCG tablet TAKE 1 TABLET BY MOUTH EVERY DAY    . montelukast (SINGULAIR) 10 MG tablet Take 10 mg daily by mouth.     . Multiple Vitamin (MULTIVITAMIN WITH MINERALS) TABS tablet Take 1 tablet daily by mouth.    . nystatin (MYCOSTATIN/NYSTOP) powder Apply topically 4 (four) times daily. 60 g 6  . nystatin cream (MYCOSTATIN) Apply 1 application topically 2 (two) times daily. 30 g 6  . Omega-3 1000 MG CAPS Take 2 g daily by mouth.     . pantoprazole (PROTONIX) 40 MG tablet Take 40 mg  by mouth every morning.     . polyvinyl alcohol (LIQUIFILM TEARS) 1.4 % ophthalmic solution Place 1 drop into both eyes as needed for dry eyes.    . potassium chloride (K-DUR) 10 MEQ tablet TAKE 1 TABLET BY MOUTH EVERY DAY    . traMADol (ULTRAM) 50 MG tablet 1 tablet 3 times a day by mouth PRN for pain 45 tablet 0  . Vitamins/Minerals TABS Take by mouth.     No current facility-administered medications for this visit.     Social History   Socioeconomic History  . Marital status: Married    Spouse name: Not on file  . Number of children: Not on file  . Years of education: Not on file  . Highest education level: Not on file  Occupational History  . Not on file  Social Needs  . Financial resource strain: Not on file  . Food insecurity    Worry: Not on file    Inability: Not on file  . Transportation needs    Medical: Not on file    Non-medical: Not on file  Tobacco Use  . Smoking status: Former Smoker    Packs/day: 2.00    Years: 41.00    Pack years: 82.00    Types: Cigarettes    Quit date: 06/05/1999    Years since  quitting: 19.8  . Smokeless tobacco: Never Used  Substance and Sexual Activity  . Alcohol use: No  . Drug use: No  . Sexual activity: Not on file  Lifestyle  . Physical activity    Days per week: Not on file    Minutes per session: Not on file  . Stress: Not on file  Relationships  . Social Herbalist on phone: Not on file    Gets together: Not on file    Attends religious service: Not on file    Active member of club or organization: Not on file    Attends meetings of clubs or organizations: Not on file    Relationship status: Not on file  . Intimate partner violence    Fear of current or ex partner: Not on file    Emotionally abused: Not on file    Physically abused: Not on file    Forced sexual activity: Not on file  Other Topics Concern  . Not on file  Social History Narrative  . Not on file    Family History  Problem Relation Age of Onset  . Cancer Father 105       lung and kidney   . Alzheimer's disease Mother   . Heart attack Maternal Grandmother   . Alcohol abuse Brother   . Breast cancer Paternal Aunt   . Breast cancer Cousin   . Breast cancer Cousin    Vitals: Blood pressure (!) 137/59, pulse 91, temperature 98.5 F (36.9 C), temperature source Oral, resp. rate 18, height 5\' 5"  (1.651 m), weight 242 lb (109.8 kg), SpO2 97 %.  Physical Exam: General : The patient is a healthy woman in no acute distress.   Groins: The right groin is without lymphadenopathy and there is no erythema.  The left groin has no adenopathy but throughout the left groin, the lower abdominal wall near the pannus on the right thigh has continued condyloma though markedly improved.  Nystatin powder is visualized.  Vulva: There is a large patch measuring approximately 3 x 4 cm on the labia majora going inside the vagina.  There is another smaller  plaque on the right labia minora measuring approximately 2 x 1 cm.  After obtaining the patient's verbal consent the area was cleansed  with Betadine.  0.8 mL's of 1% plain lidocaine was injected.  Using a 4 mm punch biopsy vulvar biopsy was performed.  Silver nitrate was used for hemostasis.  She tolerated the procedure well.     Omelia Marquart A., MD 03/31/2019, 8:15 AM

## 2019-04-01 ENCOUNTER — Telehealth: Payer: Self-pay | Admitting: *Deleted

## 2019-04-01 NOTE — Telephone Encounter (Signed)
Patient called and stated "I saw Dr. Alycia Rossetti on 10/27 and she said she would see be after Dr. Lynnell Jude." Explained that per Dr. Elenora Gamma note to call her office back after being released from plastics for a follow up

## 2019-04-05 ENCOUNTER — Inpatient Hospital Stay: Payer: Medicare HMO | Admitting: Gynecologic Oncology

## 2019-04-06 ENCOUNTER — Other Ambulatory Visit: Payer: Self-pay

## 2019-04-06 ENCOUNTER — Encounter: Payer: Self-pay | Admitting: Podiatry

## 2019-04-06 ENCOUNTER — Ambulatory Visit: Payer: Medicare HMO | Admitting: Podiatry

## 2019-04-06 DIAGNOSIS — Q828 Other specified congenital malformations of skin: Secondary | ICD-10-CM | POA: Diagnosis not present

## 2019-04-06 NOTE — Progress Notes (Signed)
Subjective:   Patient ID: [name], female   DOB: 73 y.o.   MRN: [id number]   HPI Patient presents stating she has had a collapse of the arch left with severe keratotic lesion x2 left   ROS      Objective:  Physical Exam  Neurovascular status unchanged with severe lesion subfourth metatarsal left and plantar aspect left around the fifth metatarsal base     Assessment:  Severe foot structural issues with keratotic lesion formation     Plan:  Sharp sterile debridement of lesions accomplished with no iatrogenic bleeding and continue routine care as needed

## 2019-05-09 NOTE — Progress Notes (Signed)
Consult Note: Gyn-Onc   [name] 73 y.o. female  Chief Complaint  Patient presents with  . Extramammary Paget's disease    Assessment : Long-standing history of Paget's disease of the vulva.    Plan:   Exam is reassuring today.  She will return to see [place] in 3 months.    She was encouraged to see her primary care provider and her cardiologist secondary to her bradycardia and subjective increased shortness of breath. She appears to have a Hisian focus based on her EKG. We have contacted Dr. Ainsley Spinner office to see if we can help facilitate a visit in the near future. Her HR went up with walking and she said that her palpitations were better when "up and around" than when she is sitting. She declined ER evaluation today which is why we did an EKG and walk test in clinic.  I discussed with her that she may need a Holter monitor to see how often these episodes are occurring and that she may or may not need to see an electrophysiologist for consideration of ablation.  This would of course be determined by her cardiologist.  Once we hear back from her cardiologist office regarding an appointment, we will contact her.  Greater than 25 minutes face-to-face time was spent with the patient most of it was discussing and reviewing her EKG with her and performing the walk test.   HPI:  Patient is a 73 year old with a history of a partial simple vulvectomy in February of 2012 for vulvar Paget's disease. Some of the surgical margins were positive. Her postoperative course was uncomplicated. She was last seen by our service in 12/14. At that time there was no evidence of any lesions consistent with Paget's. However, there is considerable amount of excoriations throughout the entire vulvar and medial thighs consistent with chronic vulvitis.In December 2013 she had a hernia repair due to bowel obstruction by Dr. Excell Seltzer with permanent mesh. In May 2013 she also had a TVT bladder by Dr. Everett Graff. She  does continue to wear a pad and also takes Detrol twice daily but states that her urinary leakage is markedly improved. She described the vulvar irritation is intermittent itching with burning and that it has increased over time as has the pruritus. She does use a steroid cream twice daily and does scratch her vulva.   She underwent a wide local excision of the left vulva on January 2 of year 2014. Operative findings included 2 separate 3 x 3 and 1.2 cm lesions consistent with Paget's on the left.  Pathology revealed 2 foci of extramammary Paget's disease extending to the left inked margin in the right and anterior tip margins at multiple foci. The posterior margin was negative for malignancy.   I performed a biopsy of this it was consistent with recurrent Paget's disease. On 11/23/2012 showed a repeat wide local excision of vulva x2 with the posterior fourchette biopsy. Findings revealed along the left labia minora/vagina is a Paget's disease measuring approximately 2.5 x 1 cm in the superior aspect of her prior left vulvar excision. The inferior aspect of a similar lesion. 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 On the left perineal body the area is somewhat excoriated pale. Biopsy was performed in this area.  On 08/02/2013 she underwent wide local excisions of the vulva x2.  Operative findings included pale pink raised lesion measuring 1 x 0.5 cm the left upper vulva near the prior excision site.  There is a pale raised lesion measuring 1.5 x 1 cm on the left crossing midline perineal body.  Pathology revealed left superior vulvar excision with extramammary Paget's disease extending to the 9:00 margin. The other margins were negative for tumor. Of the left of midline lesion she extramammary Paget's disease extending to the 12:00 margin and focally at the 6:00 margin but otherwise negative margins. The patient at that time discussed with [place] that she was having some right  lower quadrant pain. Her exam is very difficult secondary to habitus. There ultrasound was obtained. Ultrasound revealed:  I saw her March 16, 2014 at which time there was an area on the left vulva with hyperkeratosis. A biopsy of that area was performed consistent with Paget's disease. On March 29, 2014 she underwent wide local excision of the vulva. On exam she had a 1 cm left vulvar lesion just below the left labia minora. She underwent a wide local excision. He received a 3.7 x 1.7 x 0.3 cm lesion. There was extramammary Paget's disease extending to the 6 to 9:00 margin in the 12:00 margin. This is despite grossly negative margins on physical examination.   She had an excision in November that revealed Diagnosis Vulva, excision, w/stitch @ 1200 - EXTRAMAMMARY PAGET'S DISEASE, EXTENDING TO THE 6-9 O'CLOCK MARGIN AND 12 O'CLOCK PERPENDICULAR MARGIN.  I saw her in May 2016 at which time she has small area consistent with Paget's on the right labia minora and a larger patch on the left. Biopsy on the right revealed extramammary Paget's. After discussion she wished to try the Aldara again. She called and requested an earlier appointment as for the last 2-3 weeks had increased itching and has been scratching so much that she's had some bleeding. I last saw her on July 6. At that time she had been having a lot of symptoms of pruritus and scratching a great deal. Vulvar examination at that time revealed a fairly macerated vulva and it was possible to discern what could be related to Paget's, effect of the Aldara therapy, trauma from scratching. She was asked to stop the Aldara therapy and use a low-dose daily cream. We asked her to refrain from scratching come in today for evaluation.  I saw her again December 26, 2104 at which time there was evidence clearly a Paget's disease and she scheduled for surgery. On January 24, 2015 she underwent bilateral wide local excisions of the vulva. On the right vulva  there was a 2 cm patch of Paget's disease in the mid labia majora. On the left side. The remaining labia minora in the area prior to the excision are smaller lesion consistent with Paget's disease.  Diagnosis 1. Vulva, excision, left wide excision - EXTRAMAMMARY PAGET'S DISEASE EXTENDING TO THE 12 O'CLOCK, 3 O'CLOCK, 6 O'CLOCK AND 9 O'CLOCK MARGINS. 2. Vulva, excision, right wide excision - EXTRAMAMMARY PAGET'S EXTENDING TO THE 3 O'CLOCK MARGIN AND THE 6 O'CLOCK POLE.  Patient was next seen in February 2016 at which time there was concern for recurrent disease. She decided to proceed with Aldara therapy which she used for 4 weeks and then stopped it secondary to the expense of the medication. Started using clobetasol. Her pruritus stopped with Aldara, but unfortunately returned when she switched to clobetasol. In May 2016 she was seen and had an area that was smaller still consistent with Paget's disease on the right labia minora and a larger patch on the left. Biopsy on the right revealed extramammary Paget's. She opted  to try Aldara again. Given evidence of recurrent disease she underwent bilateral wide local excision of the vulva at the end of August 2016. Both wide local excision specimens were positive for Paget's disease with extension to focal margins on each.  Most recently, the patient was taken to the operating room on 01/31/2016 for preoperative mapping of her lesion for her planned wide local excision of the left vulva and smaller excision on the right. Operative findings at that time were notable for surgically absent inferior aspect of the left labium minora and majora. Multiple areas with white flaky skin changes were biopsied and the abnormal-appearing tissue. Changes noted in the area of the clitoris and perineum and at the introitus just distal to the hymen. Biopsies were collected on all of the sites.   10/17 Findings: 1. Surgical absent left labia majorum inferiorly from prior  vulvectomies.  2. Focusing on the left vulva, there was gross disease incorporating the prior surgical site, characterized by patchy white and erythematous epithelium. The affected area spanned from the perineal body to 5-6cm laterally. Prior mapping biopsy sites x3 were well healed and visible and surrounded by what appeared to be grossly normal appearing skin. Superiorly on the left labia majorum, there was affected epithelium with a similar appearing patchy white, erythematous epithelium.  3. Focusing of the right vulva, there was less extensive disease. Affected area extended from the perineal body to roughly 3cm laterally with minimal involvement of the right labia majorum.  4. The urethra, anus, and clitoris were spared.  5. A rectal exam was performed during the procedure to confirm protection of the sphincter, which was intact at the conclusion of our case.  6. A rectocele was notable 3cm into the vagina.   Pathology: Final Diagnosis  A: Vulva, right and left, vulvectomy - (Recurrent/persisent) Paget disease  - Disease size approximately 8.3 x 4.6 cm  - Disease confined to the epidermis - Multiple epidermal margins are positive for disease: 6 o'clock to 11 o'clock (per original orientation)  - Deep margin free of involvement  Interval History:  She was last see by Dr. Fermin Schwab in 1/20 with no evidence of recurrent disease.  I saw her at the end of July 2020. She had such extensive candida at that time that I could not evaluate for Paget's. 9/20 I diagnosed her with recurrent Pagets. She underwent radical excision and plastics repair with Dr. Lynnell Jude at [facility] on March 04, 2019.  The right vulva revealed Paget's disease measuring 3.2 x 2 cm.  The disease is confined to the epidermis with no invasive adenocarcinoma.  She did have microscopically positive margins.  The deep stromal margins were negative.  There was a benign intradermal nevus.  On the left side similarly there was a 4 x 2 cm  area of Paget's disease that was confined to the epidermis.  Same types of margins were positive.  I saw her for a postoperative check on October 27.  She was seen by plastic surgery on October 17.  At that time the left medial thigh and right rotational flaps are viable and healthy with no hematoma or seroma.  All of the incisions were closed and the nylon sutures were removed.  She comes in today for follow-up.  Her clinic vitals were worrisome due to her HR being so low. She states that she feels palpitation but no chest pain. She does have some occassional chest tightness which is the same as she had when she was admitted in  11/18. She stated that she was admitted this year and was surprised when I told her that was 2 years ago. She did have follow up with her cardiologist that she missed due to her surgery at [facility]. She c/o SOB but she attributes this primarily to having to wear a mask all the time.  She states however occasionally at home she will feel little bit of shortness of breath.  She denies any other complaints such as headaches or visual changes.  She denies any dizziness.  She continues to have discomfort and issues from her orthopedic issues.  She denies any vulvar discomfort.  She was seen by plastic surgery and released from their service.   Allergies  Allergen Reactions  . Hydromorphone Hcl Itching and Rash    Per CRNA, pt tol Fentanyl IV for OR case, no issues reported to rec RN in PACU, no immediate PACU issues noted as well upon arrival  . Demerol  [Meperidine Hcl] Other (See Comments)    "Knocks me out"  . Other Other (See Comments)    Blisters  . Codeine Other (See Comments)    hallucinations Hyper/ Hallucinations   . Demerol [Meperidine] Other (See Comments)    "Knocks me out"  . Diazepam Other (See Comments)    Extreme sedation; patient does not recall taking diazepam (12/07/14)  . Tape Hives and Other (See Comments)    "thick clear plastic tape" causes  blisters Blisters Blisters  . Macrobid [Nitrofurantoin] Rash  . Morphine And Related Itching and Rash    Per CRNA, pt tol Fentanyl IV for OR case, no issues reported to rec RN in PACU, no immediate PACU issues noted as well upon arrival  . Penicillins Hives and Swelling  . Valisone [Betamethasone] Rash    Past Medical History:  Diagnosis Date  . Anxiety   . Arthritis   . Asthma   . Chronic constipation   . Depression   . Fibromyalgia   . GERD (gastroesophageal reflux disease)   . 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 History of colon polyps    2007 hyperplagia  . History of esophageal dilatation    2008  . History of peptic ulcer   . History of small bowel obstruction    2012  . Hypothyroidism   . Macular degeneration of right eye   . Numbness of left foot    4 toes  . Paget's disease of vulva   . Personal history of chemotherapy 1997  . Personal history of radiation therapy 2005  . PONV (postoperative nausea and vomiting)    and HARD TO WAKE  . Seasonal allergies   . SUI (stress urinary incontinence, female)   . Wears glasses     Past Surgical History:  Procedure Laterality Date  . BLADDER SUSPENSION  10/13/2011   Procedure: TRANSVAGINAL TAPE (TVT) PROCEDURE;  Surgeon: Delice Lesch, MD;  Location: [facility];  Service: Gynecology;  Laterality: N/A;  . BREAST BIOPSY Left 03/19/2004  . BREAST LUMPECTOMY Left 03-27-2004  . CARDIAC CATHETERIZATION  10-30-2000  dr Wynonia Lawman   normal coronary arteries and LVF/  ef 65-70%  . CLOSED MANIPULATION  W/ OPEN REDUCTION EXCHANGE TIBIAL FEMORAL BEARING POST TOTAL LEFT KNEE  04-14-2001  . CYSTOSCOPY  10/13/2011   Procedure: CYSTOSCOPY;  Surgeon: Delice Lesch, MD;  Location: [facility];  Service: Gynecology;  Laterality: Bilateral;  . DILATION AND CURETTAGE OF UTERUS  1968  . FOOT  SURGERY Left 02-01-2009   TRIPLE ARTHRODESIS  . FOOT SURGERY Left 11/14   I&D left foot with woundvac placement  . INCISIONAL HERNIA REPAIR  05/05/2011   Procedure: LAPAROSCOPIC INCISIONAL HERNIA;  Surgeon: Edward Jolly, MD;  Location: [facility];  Service: General;  Laterality: N/A;  LAPAROSCOPIC REPAIR INCARCERATED INCISIONAL HERNIA  WITH MESH  . KNEE ARTHROSCOPY Bilateral left [id number] & 04-1992/   right [phone]  . LAPAROSCOPIC CHOLECYSTECTOMY  06-21-2010  . LEFT HEART CATH AND CORONARY ANGIOGRAPHY N/A 04/13/2017   Procedure: LEFT HEART CATH AND CORONARY ANGIOGRAPHY;  Surgeon: Jettie Booze, MD;  Location: [facility];  Service: Cardiovascular;  Laterality: N/A;  . LUMBAR SPINE SURGERY  1998   L5 -- S1  . MASTECTOMY Right 1997   W/  LYMPH NODE DISSECTION AND RECONSTRUCTION WITH IMPLANTS AND EXPANDER  . NEGATIVE SLEEP STUDY  12/26/2015  . PORT-A-CATH PLACEMENT AND REMOVAL  1997  . REMOVAL RIGHT BREAST IMPLANT AND CAPSULECTOMY  06-03-1999  . REVISION TOTAL KNEE ARTHROPLASTY Left 07-07-2005  &  12-10-2001   12-10-2001  REMOVAL TOTAL KNEE/ I&D / INSERTION ANTIBIOTIC SPACER  . REVISION TOTAL KNEE ARTHROPLASTY Right 07-07-2005  . ROTATOR CUFF REPAIR Right   . TENDON REPAIR RIGHT RING FINGER  2011  . THUMB TRIGGER RELEASE Right 1993  . THYROIDECTOMY, PARTIAL  1983  . TONSILLECTOMY  1968  . TOTAL KNEE ARTHROPLASTY Bilateral right 05-04-2000/  left 03-29-2001  . VULVECTOMY  05/27/2012   Procedure: WIDE EXCISION VULVECTOMY;  Surgeon: Imagene Gurney A. Alycia Rossetti, MD;  Location: [facility];  Service: Gynecology;  Laterality: N/A;  Wide Local Excision of Vulva   . VULVECTOMY N/A 11/23/2012   Procedure: WIDE LOCAL EXCISION OF THE VULVA;  Surgeon: Imagene Gurney A. Alycia Rossetti, MD;  Location: [facility];  Service: Gynecology;  Laterality: N/A;  left inferior vulvar biopsy excision left superior lesion left inferior vulvar excision  . VULVECTOMY N/A 08/02/2013   Procedure: WIDE LOCAL  EXCISION VULVA;  Surgeon: Imagene Gurney A.  Alycia Rossetti, MD;  Location: [facility];  Service: Gynecology;  Laterality: N/A;  . VULVECTOMY Left 03/29/2014   Procedure: WIDE LOCAL EXCISION OF LEFT VULVA ;  Surgeon: Imagene Gurney A. Alycia Rossetti, MD;  Location: [facility];  Service: Gynecology;  Laterality: Left;  Marland Kitchen VULVECTOMY Bilateral 01/24/2015   Procedure: WIDE LOCAL EXCISION VULVA;  Surgeon: [name], MD;  Location: [place];  Service: Gynecology;  Laterality: Bilateral;  . VULVECTOMY PARTIAL  07-16-2010    Current Outpatient Medications  Medication Sig Dispense Refill  . albuterol (PROVENTIL HFA;VENTOLIN HFA) 108 (90 BASE) MCG/ACT inhaler Inhale 2 puffs into the lungs every 4 (four) hours as needed for wheezing or shortness of breath. Reported on 08/01/2015    . aspirin EC 81 MG EC tablet Take 1 tablet (81 mg total) daily by mouth. 30 tablet 0  . atorvastatin (LIPITOR) 40 MG tablet Take 1 tablet (40 mg total) daily at 6 PM by mouth. 30 tablet 0  . baclofen (LIORESAL) 10 MG tablet TAKE 1 TABLET BY MOUTH TWICE A DAY 180 tablet 0  . budesonide-formoterol (SYMBICORT) 160-4.5 MCG/ACT inhaler Inhale into the lungs.    Marland Kitchen buPROPion (WELLBUTRIN XL) 150 MG 24 hr tablet Take 150 mg by mouth every morning.     . Cholecalciferol (VITAMIN D3) 1000 UNITS CAPS Take 2 capsules by mouth daily.    Marland Kitchen  citalopram (CELEXA) 40 MG tablet Take 40 mg by mouth every morning.     . clindamycin (CLEOCIN) 150 MG capsule Take 4 tablets 30 minutesprior to dental procedure.    . furosemide (LASIX) 40 MG tablet Take 40 mg by mouth 2 (two) times daily.    Marland Kitchen HYDROcodone-homatropine (HYCODAN) 5-1.5 MG/5ML syrup TAKE 2.5MLS TO 5 MLS BY MOUTH AT BEDTIME  0  . hydrOXYzine (ATARAX/VISTARIL) 25 MG tablet Take 1 tablet (25 mg total) by mouth 2 (two) times daily as needed for itching. 30 tablet 0  . ibandronate (BONIVA) 150 MG tablet Take 150 mg every 30 (thirty) days by mouth. Take in the morning with a full glass of water, on an empty stomach, and do not take  anything else by mouth or lie down for the next 30 min.    Marland Kitchen ipratropium-albuterol (DUONEB) 0.5-2.5 (3) MG/3ML SOLN ipratropium 0.5 mg-albuterol 3 mg (2.5 mg base)/3 mL nebulization soln  INHALE 3 MLS (1 VIAL) BY NEBULIZATION EVERY 4 (HOURS AS NEEDED FOR WHEEZING.    Marland Kitchen ketoconazole (NIZORAL) 2 % cream Apply topically.    Marland Kitchen levothyroxine (SYNTHROID) 175 MCG tablet TAKE 1 TABLET BY MOUTH EVERY DAY    . montelukast (SINGULAIR) 10 MG tablet Take 10 mg daily by mouth.     . Multiple Vitamin (MULTIVITAMIN WITH MINERALS) TABS tablet Take 1 tablet daily by mouth.    . nystatin (MYCOSTATIN/NYSTOP) powder Apply topically 4 (four) times daily. 60 g 6  . nystatin cream (MYCOSTATIN) Apply 1 application topically 2 (two) times daily. 30 g 6  . Omega-3 1000 MG CAPS Take 2 g daily by mouth.     . pantoprazole (PROTONIX) 40 MG tablet Take 40 mg by mouth every morning.     . polyvinyl alcohol (LIQUIFILM TEARS) 1.4 % ophthalmic solution Place 1 drop into both eyes as needed for dry eyes.    . potassium chloride (K-DUR) 10 MEQ tablet TAKE 1 TABLET BY MOUTH EVERY DAY    . traMADol (ULTRAM) 50 MG tablet 1 tablet 3 times a day by mouth PRN for pain 45 tablet 0  . Vitamins/Minerals TABS Take by mouth.     No current facility-administered medications for this visit.    Social History   Socioeconomic History  . Marital status: Married    Spouse name: Not on file  . Number of children: Not on file  . Years of education: Not on file  . Highest education level: Not on file  Occupational History  . Not on file  Tobacco Use  . Smoking status: Former Smoker    Packs/day: 2.00    Years: 41.00    Pack years: 82.00    Types: Cigarettes    Quit date: 06/05/1999    Years since quitting: 19.9  . Smokeless tobacco: Never Used  Substance and Sexual Activity  . Alcohol use: No  . Drug use: No  . Sexual activity: Not on file  Other Topics Concern  . Not on file  Social History Narrative  . Not on file   Social  Determinants of Health   Financial Resource Strain:   . Difficulty of Paying Living Expenses: Not on file  Food Insecurity:   . Worried About Charity fundraiser in the Last Year: Not on file  . Ran Out of Food in the Last Year: Not on file  Transportation Needs:   . Lack of Transportation (Medical): Not on file  . Lack of Transportation (Non-Medical): Not on file  Physical Activity:   . Days of Exercise per Week: Not on file  . Minutes of Exercise per Session: Not on file  Stress:   . Feeling of Stress : Not on file  Social Connections:   . Frequency of Communication with Friends and Family: Not on file  . Frequency of Social Gatherings with Friends and Family: Not on file  . Attends Religious Services: Not on file  . Active Member of Clubs or Organizations: Not on file  . Attends Archivist Meetings: Not on file  . Marital Status: Not on file  Intimate Partner Violence:   . Fear of Current or Ex-Partner: Not on file  . Emotionally Abused: Not on file  . Physically Abused: Not on file  . Sexually Abused: Not on file    Family History  Problem Relation Age of Onset  . Cancer Father 9       lung and kidney   . Alzheimer's disease Mother   . Heart attack Maternal Grandmother   . Alcohol abuse Brother   . Breast cancer Paternal Aunt   . Breast cancer Cousin   . Breast cancer Cousin    Vitals: Blood pressure (!) 137/59, pulse 91, temperature 98.5 F (36.9 C), temperature source Oral, resp. rate 18, height 5\' 5"  (1.651 m), weight 242 lb (109.8 kg), SpO2 97 %.  Physical Exam: General : The patient is a healthy woman in no acute distress.   Abdomen: Under the pannus and involving the mons is fair amount of Candida.  Pelvic: External genitalia notable for multiple excision sites.  She has well-healing rotational skin flap on the right.  The one on the left does show some contracture at the lower aspect near the knee medially.  There are no visible lesions consistent  with vulvar Paget's.   Rain Friedt A., MD 05/11/2019, 12:11 PM

## 2019-05-11 ENCOUNTER — Inpatient Hospital Stay: Payer: Medicare HMO | Attending: Gynecologic Oncology | Admitting: Gynecologic Oncology

## 2019-05-11 ENCOUNTER — Encounter: Payer: Self-pay | Admitting: Gynecologic Oncology

## 2019-05-11 ENCOUNTER — Other Ambulatory Visit: Payer: Self-pay

## 2019-05-11 VITALS — BP 131/56 | HR 42 | Temp 98.0°F | Resp 20 | Ht 65.0 in | Wt 248.0 lb

## 2019-05-11 DIAGNOSIS — R001 Bradycardia, unspecified: Secondary | ICD-10-CM | POA: Diagnosis not present

## 2019-05-11 DIAGNOSIS — C4499 Other specified malignant neoplasm of skin, unspecified: Secondary | ICD-10-CM

## 2019-05-11 DIAGNOSIS — R0602 Shortness of breath: Secondary | ICD-10-CM | POA: Diagnosis not present

## 2019-05-11 DIAGNOSIS — C519 Malignant neoplasm of vulva, unspecified: Secondary | ICD-10-CM | POA: Insufficient documentation

## 2019-05-11 DIAGNOSIS — R002 Palpitations: Secondary | ICD-10-CM | POA: Diagnosis not present

## 2019-05-11 DIAGNOSIS — Z9079 Acquired absence of other genital organ(s): Secondary | ICD-10-CM | POA: Insufficient documentation

## 2019-05-11 NOTE — Progress Notes (Signed)
An appt. scheduled for pt. to see her Cardiologist, Dr. Doug Sou, on Friday 12/18 at 1:00 pm. Pt is aware of the appointment date and time.

## 2019-05-30 ENCOUNTER — Telehealth: Payer: Self-pay | Admitting: Podiatry

## 2019-05-30 NOTE — Telephone Encounter (Signed)
I would like a print out of all my office visit and co-pays for 2020. My number is [phone]. Thank you.

## 2019-06-20 ENCOUNTER — Encounter: Payer: Self-pay | Admitting: Podiatry

## 2019-06-20 ENCOUNTER — Other Ambulatory Visit: Payer: Self-pay

## 2019-06-20 ENCOUNTER — Ambulatory Visit: Payer: Medicare HMO | Admitting: Podiatry

## 2019-06-20 VITALS — Temp 96.6°F

## 2019-06-20 DIAGNOSIS — M7752 Other enthesopathy of left foot: Secondary | ICD-10-CM

## 2019-06-20 DIAGNOSIS — Q828 Other specified congenital malformations of skin: Secondary | ICD-10-CM

## 2019-06-20 DIAGNOSIS — M779 Enthesopathy, unspecified: Secondary | ICD-10-CM

## 2019-06-20 NOTE — Progress Notes (Signed)
Subjective:   Patient ID: [name], female   DOB: 74 y.o.   MRN: [id number]   HPI Patient presents with several lesions plantar aspect left and also inflammation in the fourth MPJ left that is been more recent    ROS      Objective:  Physical Exam  Neurovascular status intact with patient having several lesions left severe foot structural issues and inflammation of the fifth and fourth MPJs left     Assessment:  Inflammatory capsulitis along with keratotic lesion formation with pain     Plan:  H&P reviewed both conditions and did do a sterile prep and injected the fifth MPJ and into the fourth MPJ 3 mg Dexasone Kenalog 5 mg Xylocaine and then debrided plantar lesions and reappoint as symptoms indicate with patient understanding severe foot structure being a part of the pathology

## 2019-08-09 ENCOUNTER — Telehealth: Payer: Self-pay

## 2019-08-09 NOTE — Telephone Encounter (Signed)
NOTES ON [name], SENT REFERRAL TO [facility]

## 2019-08-11 ENCOUNTER — Ambulatory Visit: Payer: Medicare HMO | Admitting: Orthopedic Surgery

## 2019-08-11 ENCOUNTER — Other Ambulatory Visit: Payer: Self-pay

## 2019-08-11 ENCOUNTER — Encounter: Payer: Self-pay | Admitting: Orthopedic Surgery

## 2019-08-11 ENCOUNTER — Ambulatory Visit (INDEPENDENT_AMBULATORY_CARE_PROVIDER_SITE_OTHER): Payer: Medicare HMO

## 2019-08-11 VITALS — Ht 65.0 in | Wt 240.0 lb

## 2019-08-11 DIAGNOSIS — M19012 Primary osteoarthritis, left shoulder: Secondary | ICD-10-CM

## 2019-08-11 DIAGNOSIS — M25512 Pain in left shoulder: Secondary | ICD-10-CM | POA: Diagnosis not present

## 2019-08-11 MED ORDER — MELOXICAM 15 MG PO TABS: 15.0000 mg | ORAL_TABLET | Freq: Every day | ORAL | 0 refills | Status: DC

## 2019-08-12 ENCOUNTER — Encounter: Payer: Self-pay | Admitting: Orthopedic Surgery

## 2019-08-12 LAB — CBC WITH DIFFERENTIAL/PLATELET
Absolute Monocytes: 298 cells/uL (ref 200–950)
Basophils Absolute: 21 cells/uL (ref 0–200)
Basophils Relative: 0.5 %
Eosinophils Absolute: 50 cells/uL (ref 15–500)
Eosinophils Relative: 1.2 %
HCT: 36.7 % (ref 35.0–45.0)
Hemoglobin: 12 g/dL (ref 11.7–15.5)
Lymphs Abs: 974 cells/uL (ref 850–3900)
MCH: 28 pg (ref 27.0–33.0)
MCHC: 32.7 g/dL (ref 32.0–36.0)
MCV: 85.5 fL (ref 80.0–100.0)
MPV: 13.4 fL — ABNORMAL HIGH (ref 7.5–12.5)
Monocytes Relative: 7.1 %
Neutro Abs: 2856 cells/uL (ref 1500–7800)
Neutrophils Relative %: 68 %
Platelets: 121 10*3/uL — ABNORMAL LOW (ref 140–400)
RBC: 4.29 10*6/uL (ref 3.80–5.10)
RDW: 14 % (ref 11.0–15.0)
Total Lymphocyte: 23.2 %
WBC: 4.2 10*3/uL (ref 3.8–10.8)

## 2019-08-12 LAB — SEDIMENTATION RATE: Sed Rate: 6 mm/h (ref 0–30)

## 2019-08-12 LAB — C-REACTIVE PROTEIN: CRP: 2.7 mg/L (ref <8.0)

## 2019-08-12 NOTE — Progress Notes (Signed)
Office Visit Note   Patient: Vanessa FAYETTE           Date of Birth: [birth year]           MRN: [id number] Visit Date: 08/11/2019 Requested by: Chesley Noon, MD [place],  [place] [address] PCP: Chesley Noon, MD  Subjective: Chief Complaint  Patient presents with  . Left Shoulder - Pain    DOI 07/07/2019    HPI: [name] is a 74 y.o. female who presents to the office complaining of left shoulder pain.  Patient has a history of left shoulder arthritis.  She also has a history of proximal humerus fracture which healed.  She fell on 07/07/2019 and try to catch herself with her left arm.  Since this fall she has significant dysfunction of her left arm and is unable to lift to get the arm at shoulder level.  She notes her pain level of 6 out of 10.  The left arm was extended behind her back and she fell onto it.  Patient is right-hand dominant.  She has been putting off surgery for 3 years but is now considering shoulder replacement.  She is taking ibuprofen and extra strength Tylenol for pain control. Her last injection was 3 months ago into the shoulder..   Patient has a history of knee replacement with for revision surgeries on that knee.  This was due to a prosthetic joint infection.  She denies any history of diabetes, blood clots, smoking.  She does have a cardiologist for frequent PVCs but she wants a new cardiologist.  She also has a history of a foot infection 4 to 5 years ago.              ROS:  All systems reviewed are negative as they relate to the chief complaint within the history of present illness.  Patient denies fevers or chills.  Assessment & Plan: Visit Diagnoses:  1. Acute pain of left shoulder   2. Primary osteoarthritis, left shoulder     Plan: Patient is a 74 year old female who presents complaining of left shoulder pain.  She has a long history of left shoulder osteoarthritis and has recently had increased pain and dysfunction of the left  shoulder following a fall.  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  After lengthy discussion regarding her symptoms and treatment options, patient wants to proceed with shoulder replacement.  Patient will require reverse shoulder arthroplasty.  Additionally prior to surgery he would need to establish a new cardiologist and have medical clearance for surgery.  Will refer patient to Dr. Burt Knack for cardiology evaluation.  Additionally, with her history of multiple infections will order CBC with differential, ESR, CRP.  Ordered thin cut CT scan of the left shoulder for preoperative planning purposes.  Patient will follow up after CT scan to review results.  Lab studies at the time of this dictation are negative for infection.  Follow-Up Instructions: No follow-ups on file.   Orders:  Orders Placed This Encounter  Procedures  . XR Shoulder Left  . CT SHOULDER LEFT WO CONTRAST  . Sed Rate (ESR)  . CBC with Differential  . C-reactive protein  . Ambulatory referral to Cardiology   Meds ordered this encounter  Medications  . meloxicam (MOBIC) 15 MG tablet    Sig: Take 1 tablet (15 mg total) by mouth daily.  Dispense:  30 tablet    Refill:  0      Procedures: No procedures performed   Clinical Data: No additional findings.  Objective: Vital Signs: Ht 5' 5"  (1.651 m)   Wt 240 lb (108.9 kg)   BMI 39.94 kg/m   Physical Exam:  Constitutional: Patient appears well-developed HEENT:  Head: Normocephalic Eyes:EOM are normal Neck: Normal range of motion Cardiovascular: Normal rate Pulmonary/chest: Effort normal Neurologic: Patient is alert Skin: Skin is warm Psychiatric: Patient has normal mood and affect  Ortho Exam:  Left shoulder exam Forward flexion to 60 degrees and abduction to 60 degrees Deltoid fires on exam 5/5 motor strength of the grip, finger abduction,  pronation, supination, elbow flexion, elbow extension, deltoid abduction. Mild weakness with supraspinatus and infraspinatus with good strength of subscapularis External rotation is about 5 degrees maximum Specialty Comments:  No specialty comments available.  Imaging: No results found.   PMFS History: Patient Active Problem List   Diagnosis Date Noted  . History of adenomatous polyp of colon 09/30/2018  . Shortness of breath   . ACS (acute coronary syndrome) ([place]) 04/11/2017  . Chest pain, rule out acute myocardial infarction 04/10/2017  . Bigeminy 04/10/2017  . Chronic cystitis 04/09/2017  . Thrombocytopenic disorder ([place]) 02/25/2017  . Osteoporosis 12/15/2016  . 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 Incomplete emptying of bladder 07/30/2016  . Left foot pain 12/11/2015  . 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 Stopped smoking with greater than 40 pack year history 10/23/2015  . 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 Mixed incontinence urge and stress 09/16/2011  . Cancer of right breast, stage 2 ([place]) 05/28/2011  . 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 Hypothyroid 05/28/2011  . GERD (gastroesophageal reflux disease) 05/28/2011  . Fibromyalgia 05/28/2011  . 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 Mild depression ([place]) 05/28/2011  . Thrombocytopenia, unspecified ([place]) 05/28/2011  . Morbid obesity ([place]) 04/10/2011  . GERD 11/09/2009  . ALLERGIC RHINITIS 08/23/2007  . ASTHMA 08/23/2007  . COUGH 08/23/2007  . SKIN CANCER, HX OF 08/23/2007   Past Medical History:  Diagnosis Date  . Anxiety   . Arthritis   . Asthma   . Chronic constipation   . Depression   . Fibromyalgia   . GERD (gastroesophageal reflux disease)   . History of breast cancer no recurrence   1997  right breast cancer  s/p  mastectomy w/ snl dissection and chemoradiation/  2005  left breast cancer DCIS  s/p   lumpectomy and radiation  . History of colon polyps    2007 hyperplagia  . History of esophageal dilatation    2008  . History of peptic ulcer   . History of small bowel obstruction    2012  . Hypothyroidism   . Macular degeneration of right eye   . Numbness of left foot    4 toes  . Paget's disease of vulva   . Personal history of chemotherapy 1997  . Personal history of radiation therapy 2005  . PONV (postoperative nausea and vomiting)    and HARD TO WAKE  . Seasonal allergies   . SUI (stress urinary incontinence, female)   . Wears glasses     Family History  Problem Relation Age of Onset  . Cancer Father 53       lung and kidney   . Alzheimer's disease Mother   . Heart attack Maternal Grandmother   .  Alcohol abuse Brother   . Breast cancer Paternal Aunt   . Breast cancer Cousin   . Breast cancer Cousin     Past Surgical History:  Procedure Laterality Date  . BLADDER SUSPENSION  10/13/2011   Procedure: TRANSVAGINAL TAPE (TVT) PROCEDURE;  Surgeon: Delice Lesch, MD;  Location: [facility];  Service: Gynecology;  Laterality: N/A;  . BREAST BIOPSY Left 03/19/2004  . BREAST LUMPECTOMY Left 03-27-2004  . CARDIAC CATHETERIZATION  10-30-2000  dr Wynonia Lawman   normal coronary arteries and LVF/  ef 65-70%  . CLOSED MANIPULATION  W/ OPEN REDUCTION EXCHANGE TIBIAL FEMORAL BEARING POST TOTAL LEFT KNEE  04-14-2001  . CYSTOSCOPY  10/13/2011   Procedure: CYSTOSCOPY;  Surgeon: Delice Lesch, MD;  Location: [facility];  Service: Gynecology;  Laterality: Bilateral;  . DILATION AND CURETTAGE OF UTERUS  1968  . FOOT SURGERY Left 02-01-2009   TRIPLE ARTHRODESIS  . FOOT SURGERY Left 11/14   I&D left foot with woundvac placement  . INCISIONAL HERNIA REPAIR  05/05/2011   Procedure: LAPAROSCOPIC INCISIONAL HERNIA;  Surgeon: Edward Jolly, MD;  Location: [facility];  Service: General;  Laterality: N/A;  LAPAROSCOPIC REPAIR INCARCERATED INCISIONAL HERNIA  WITH MESH  . KNEE ARTHROSCOPY Bilateral left  [phone] & 04-1992/   right [phone]  . LAPAROSCOPIC CHOLECYSTECTOMY  06-21-2010  . LEFT HEART CATH AND CORONARY ANGIOGRAPHY N/A 04/13/2017   Procedure: LEFT HEART CATH AND CORONARY ANGIOGRAPHY;  Surgeon: Jettie Booze, MD;  Location: [facility];  Service: Cardiovascular;  Laterality: N/A;  . LUMBAR SPINE SURGERY  1998   L5 -- S1  . MASTECTOMY Right 1997   W/  LYMPH NODE DISSECTION AND RECONSTRUCTION WITH IMPLANTS AND EXPANDER  . NEGATIVE SLEEP STUDY  12/26/2015  . PORT-A-CATH PLACEMENT AND REMOVAL  1997  . REMOVAL RIGHT BREAST IMPLANT AND CAPSULECTOMY  06-03-1999  . REVISION TOTAL KNEE ARTHROPLASTY Left 07-07-2005  &  12-10-2001   12-10-2001  REMOVAL TOTAL KNEE/ I&D / INSERTION ANTIBIOTIC SPACER  . REVISION TOTAL KNEE ARTHROPLASTY Right 07-07-2005  . ROTATOR CUFF REPAIR Right   . TENDON REPAIR RIGHT RING FINGER  2011  . THUMB TRIGGER RELEASE Right 1993  . THYROIDECTOMY, PARTIAL  1983  . TONSILLECTOMY  1968  . TOTAL KNEE ARTHROPLASTY Bilateral right 05-04-2000/  left 03-29-2001  . VULVECTOMY  05/27/2012   Procedure: WIDE EXCISION VULVECTOMY;  Surgeon: Imagene Gurney A. Alycia Rossetti, MD;  Location: [facility];  Service: Gynecology;  Laterality: N/A;  Wide Local Excision of Vulva   . VULVECTOMY N/A 11/23/2012   Procedure: WIDE LOCAL EXCISION OF THE VULVA;  Surgeon: Imagene Gurney A. Alycia Rossetti, MD;  Location: [facility];  Service: Gynecology;  Laterality: N/A;  left inferior vulvar biopsy excision left superior lesion left inferior vulvar excision  . VULVECTOMY N/A 08/02/2013   Procedure: WIDE LOCAL  EXCISION VULVA;  Surgeon: Imagene Gurney A. Alycia Rossetti, MD;  Location: [facility];  Service: Gynecology;  Laterality: N/A;  . VULVECTOMY Left 03/29/2014   Procedure: WIDE LOCAL EXCISION OF LEFT VULVA ;  Surgeon: Imagene Gurney A. Alycia Rossetti, MD;  Location: [facility];  Service: Gynecology;  Laterality: Left;  Marland Kitchen VULVECTOMY Bilateral 01/24/2015   Procedure: WIDE LOCAL EXCISION VULVA;  Surgeon: [name], MD;  Location: [facility];  Service: Gynecology;  Laterality: Bilateral;  . VULVECTOMY PARTIAL  07-16-2010   Social History   Occupational History  . Not on file  Tobacco Use  . Smoking status: Former Smoker    Packs/day: 2.00  Years: 41.00    Pack years: 82.00    Types: Cigarettes    Quit date: 06/05/1999    Years since quitting: 20.2  . Smokeless tobacco: Never Used  Substance and Sexual Activity  . Alcohol use: No  . Drug use: No  . Sexual activity: Not on file

## 2019-08-16 ENCOUNTER — Telehealth: Payer: Self-pay | Admitting: Cardiovascular Disease

## 2019-08-16 NOTE — Telephone Encounter (Signed)
Patient is requesting husband to come with her to her appt on 09/08/19 with Dr. Sallyanne Kuster. She is aware that we ask patients to come alone unless for a medical reason. She states that she would really like for him to come to the first appt with her just so he can hear everything he says to her.

## 2019-08-16 NOTE — Telephone Encounter (Signed)
spoke to patient .  Husband will be added to note for office visit  address and direction given to patient which  Area to come to  patient states her cardiologist ( DR Shirlee More)  at [facility] wanted her  See an EP  Doctor.  RN informed patient that Dr Sallyanne Kuster was not an EP  Doctor. But can refer if necessary  patient wanted to know if she needs to obtain records.    information is in care everywhere   Ov 2/21 with cardiologist ,  And family  FNP, patient also had cath at [facility] back in 2018 .    patient verbalized understanding - need mask , bring medication

## 2019-08-26 ENCOUNTER — Ambulatory Visit
Admission: RE | Admit: 2019-08-26 | Discharge: 2019-08-26 | Disposition: A | Discharge status: Home / Self Care | Payer: Medicare HMO | Source: Ambulatory Visit | Attending: Orthopedic Surgery | Admitting: Orthopedic Surgery

## 2019-08-26 ENCOUNTER — Other Ambulatory Visit: Payer: Self-pay

## 2019-08-26 DIAGNOSIS — M25512 Pain in left shoulder: Secondary | ICD-10-CM

## 2019-08-29 ENCOUNTER — Telehealth: Payer: Self-pay

## 2019-08-29 ENCOUNTER — Other Ambulatory Visit: Payer: Self-pay | Admitting: Surgical

## 2019-08-29 ENCOUNTER — Ambulatory Visit: Payer: Medicare HMO | Admitting: Orthopedic Surgery

## 2019-08-29 MED ORDER — MELOXICAM 15 MG PO TABS: 15.0000 mg | ORAL_TABLET | ORAL | 0 refills | Status: DC

## 2019-08-29 MED ORDER — TRAMADOL HCL 50 MG PO TABS: 50.0000 mg | ORAL_TABLET | Freq: Every day | ORAL | 0 refills | Status: DC | PRN

## 2019-08-29 NOTE — Telephone Encounter (Signed)
Patient had an appointment scheduled for this afternoon to review scan. She would like to know if you can call her with results since you were running late from surgery. Please advise. Thanks [phone]

## 2019-08-29 NOTE — Telephone Encounter (Signed)
Pls send in mobic qod and tramadol qd will post for surgery

## 2019-08-30 NOTE — Telephone Encounter (Signed)
Can you please send in these?

## 2019-09-08 ENCOUNTER — Ambulatory Visit (INDEPENDENT_AMBULATORY_CARE_PROVIDER_SITE_OTHER): Payer: Medicare HMO | Admitting: Cardiovascular Disease

## 2019-09-08 ENCOUNTER — Encounter: Payer: Self-pay | Admitting: Cardiovascular Disease

## 2019-09-08 ENCOUNTER — Telehealth: Payer: Self-pay | Admitting: Radiology

## 2019-09-08 ENCOUNTER — Other Ambulatory Visit: Payer: Self-pay

## 2019-09-08 VITALS — BP 138/70 | HR 81 | Ht 65.0 in | Wt 246.2 lb

## 2019-09-08 DIAGNOSIS — I493 Ventricular premature depolarization: Secondary | ICD-10-CM

## 2019-09-08 DIAGNOSIS — I429 Cardiomyopathy, unspecified: Secondary | ICD-10-CM

## 2019-09-08 DIAGNOSIS — I5032 Chronic diastolic (congestive) heart failure: Secondary | ICD-10-CM

## 2019-09-08 DIAGNOSIS — J449 Chronic obstructive pulmonary disease, unspecified: Secondary | ICD-10-CM

## 2019-09-08 MED ORDER — BISOPROLOL FUMARATE 5 MG PO TABS: 5.0000 mg | ORAL_TABLET | Freq: Every day | ORAL | 2 refills | Status: DC

## 2019-09-08 NOTE — Telephone Encounter (Signed)
Patient called triage this morning. Stated someone called her around noon yesterday. States she expecting to be called for surgery with Dr. Marlou Sa for her left shoulder.  Did you contact patient?  CB: [phone]

## 2019-09-08 NOTE — Progress Notes (Signed)
Cardiology consultation note:    Date:  09/10/2019   ID:  [name], DOB [birth year], MRN [id number]  PCP:  Chesley Noon, MD  Cardiologist:  No primary care provider on file.  Electrophysiologist:  None   Referring MD: Meredith Pel, MD   Chief Complaint  Patient presents with  . Shortness of Breath  . Irregular Heart Beat   [name] is a 74 y.o. female who is being seen today for the evaluation of dyspnea, CHF and PVCs at the request of Marlou Sa Tonna Corner, MD.  History of Present Illness:    [name] is a 74 y.o. female with a hx of previous smoking with reactive airway disease, obesity, treated hypothyroidism, who has undergone extensive cardiac evaluation for shortness of breath at [facility] and at [facility] (Dr. Shirlee More).  She has NYHA functional class IIIa dyspnea.  Sometimes she becomes short of breath with activities of daily living.  She is generally very sedentary.  She denies chest discomfort either at rest or with activity.  She does not have palpitations even though she has extremely frequent PVCs.  She has no history of syncope or near syncope.  She has had occasional dizziness and had a fall last year.  She has lower extremity edema that is easily controlled with furosemide and a moderate dose.  She does not have a stroke or TIA history and does not have intermittent claudication.  She smoked for about 40 years, 2 packs a day but quit about 20 years ago.  She retired from the [place] office.  She does not have diabetes mellitus.  She had lumpectomy and chest radiation for right breast cancer more than 20 years ago.  She underwent cardiac catheterization 2018 that showed minimal evidence of coronary atherosclerosis without any obstructive lesions.  She subsequently had a normal perfusion on a nuclear stress test in December 2020.  A couple of echocardiograms, most recently 1 performed in June 2020 showed normal left ventricular systolic  function and only mild-moderate mitral insufficiency, left ventricular hypertrophy (wall thickness 1.3 cm) and pseudonormal mitral inflow consistent with diastolic heart failure with elevated filling pressures.  The left atrium was also mildly dilated.  There is no significant abnormality of the aortic valve.  A 48-hour Holter monitor performed at [facility] in January 2020 showed a very high burden of PVCs at about 30%.  There was no ventricular tachycardia, atrial fibrillation or significant bradycardia.  A cardiac MRI was recommended as well as an electrophysiology consultation.  At that point, Mrs. Ulrick decided to move her cardiology care to [facility], closer to home.  Her MRI has not yet been scheduled.  She does have a scheduled EP appointment with Dr. Curt Bears on April 30.  She has mild thrombocytopenia without bleeding problems.  On statin, her most recent LDL cholesterol was 53.  Her HDL is excellent at 78.  Past Medical History:  Diagnosis Date  . Anxiety   . Arthritis   . Asthma   . Chronic constipation   . Depression   . Fibromyalgia   . GERD (gastroesophageal reflux disease)   . 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 History of colon polyps    2007 hyperplagia  . History of esophageal dilatation    2008  . History of peptic ulcer   .  History of small bowel obstruction    2012  . Hypothyroidism   . Macular degeneration of right eye   . Numbness of left foot    4 toes  . Paget's disease of vulva   . Personal history of chemotherapy 1997  . Personal history of radiation therapy 2005  . PONV (postoperative nausea and vomiting)    and HARD TO WAKE  . Seasonal allergies   . SUI (stress urinary incontinence, female)   . Wears glasses     Past Surgical History:  Procedure Laterality Date  . BLADDER SUSPENSION  10/13/2011    Procedure: TRANSVAGINAL TAPE (TVT) PROCEDURE;  Surgeon: Delice Lesch, MD;  Location: [facility];  Service: Gynecology;  Laterality: N/A;  . BREAST BIOPSY Left 03/19/2004  . BREAST LUMPECTOMY Left 03-27-2004  . CARDIAC CATHETERIZATION  10-30-2000  dr Wynonia Lawman   normal coronary arteries and LVF/  ef 65-70%  . CLOSED MANIPULATION  W/ OPEN REDUCTION EXCHANGE TIBIAL FEMORAL BEARING POST TOTAL LEFT KNEE  04-14-2001  . CYSTOSCOPY  10/13/2011   Procedure: CYSTOSCOPY;  Surgeon: Delice Lesch, MD;  Location: [facility];  Service: Gynecology;  Laterality: Bilateral;  . DILATION AND CURETTAGE OF UTERUS  1968  . FOOT SURGERY Left 02-01-2009   TRIPLE ARTHRODESIS  . FOOT SURGERY Left 11/14   I&D left foot with woundvac placement  . INCISIONAL HERNIA REPAIR  05/05/2011   Procedure: LAPAROSCOPIC INCISIONAL HERNIA;  Surgeon: Edward Jolly, MD;  Location: [facility];  Service: General;  Laterality: N/A;  LAPAROSCOPIC REPAIR INCARCERATED INCISIONAL HERNIA  WITH MESH  . KNEE ARTHROSCOPY Bilateral left [id number] & 04-1992/   right [phone]  . LAPAROSCOPIC CHOLECYSTECTOMY  06-21-2010  . LEFT HEART CATH AND CORONARY ANGIOGRAPHY N/A 04/13/2017   Procedure: LEFT HEART CATH AND CORONARY ANGIOGRAPHY;  Surgeon: Jettie Booze, MD;  Location: [facility];  Service: Cardiovascular;  Laterality: N/A;  . LUMBAR SPINE SURGERY  1998   L5 -- S1  . MASTECTOMY Right 1997   W/  LYMPH NODE DISSECTION AND RECONSTRUCTION WITH IMPLANTS AND EXPANDER  . NEGATIVE SLEEP STUDY  12/26/2015  . PORT-A-CATH PLACEMENT AND REMOVAL  1997  . REMOVAL RIGHT BREAST IMPLANT AND CAPSULECTOMY  06-03-1999  . REVISION TOTAL KNEE ARTHROPLASTY Left 07-07-2005  &  12-10-2001   12-10-2001  REMOVAL TOTAL KNEE/ I&D / INSERTION ANTIBIOTIC SPACER  . REVISION TOTAL KNEE ARTHROPLASTY Right 07-07-2005  . ROTATOR CUFF REPAIR Right   . TENDON REPAIR RIGHT RING FINGER  2011  . THUMB TRIGGER RELEASE Right 1993  . THYROIDECTOMY, PARTIAL  1983  .  TONSILLECTOMY  1968  . TOTAL KNEE ARTHROPLASTY Bilateral right 05-04-2000/  left 03-29-2001  . VULVECTOMY  05/27/2012   Procedure: WIDE EXCISION VULVECTOMY;  Surgeon: Imagene Gurney A. Alycia Rossetti, MD;  Location: [facility];  Service: Gynecology;  Laterality: N/A;  Wide Local Excision of Vulva   . VULVECTOMY N/A 11/23/2012   Procedure: WIDE LOCAL EXCISION OF THE VULVA;  Surgeon: Imagene Gurney A. Alycia Rossetti, MD;  Location: [facility];  Service: Gynecology;  Laterality: N/A;  left inferior vulvar biopsy excision left superior lesion left inferior vulvar excision  . VULVECTOMY N/A 08/02/2013   Procedure: WIDE LOCAL  EXCISION VULVA;  Surgeon: Imagene Gurney A. Alycia Rossetti, MD;  Location: [facility];  Service: Gynecology;  Laterality: N/A;  . VULVECTOMY Left 03/29/2014   Procedure: WIDE LOCAL EXCISION OF LEFT VULVA ;  Surgeon: Imagene Gurney A. Alycia Rossetti, MD;  Location: [facility];  Service: Gynecology;  Laterality: Left;  .  VULVECTOMY Bilateral 01/24/2015   Procedure: WIDE LOCAL EXCISION VULVA;  Surgeon: [name], MD;  Location: [facility];  Service: Gynecology;  Laterality: Bilateral;  . VULVECTOMY PARTIAL  07-16-2010    Current Medications: Current Meds  Medication Sig  . albuterol (PROVENTIL HFA;VENTOLIN HFA) 108 (90 BASE) MCG/ACT inhaler Inhale 2 puffs into the lungs every 4 (four) hours as needed for wheezing or shortness of breath. Reported on 08/01/2015  . aspirin EC 81 MG EC tablet Take 1 tablet (81 mg total) daily by mouth.  Marland Kitchen atorvastatin (LIPITOR) 40 MG tablet Take 1 tablet (40 mg total) daily at 6 PM by mouth.  . budesonide-formoterol (SYMBICORT) 160-4.5 MCG/ACT inhaler Inhale into the lungs.  Marland Kitchen buPROPion (WELLBUTRIN XL) 150 MG 24 hr tablet Take 150 mg by mouth every morning.   . Cholecalciferol (VITAMIN D3) 1000 UNITS CAPS Take 2 capsules by mouth daily.  . citalopram (CELEXA) 40 MG tablet Take 40 mg by mouth every morning.   . clindamycin (CLEOCIN) 150 MG capsule Take 4 tablets 30 minutesprior to dental procedure.    . furosemide (LASIX) 40 MG tablet Take 40 mg by mouth 2 (two) times daily.  Marland Kitchen HYDROcodone-homatropine (HYCODAN) 5-1.5 MG/5ML syrup TAKE 2.5MLS TO 5 MLS BY MOUTH AT BEDTIME  . ibandronate (BONIVA) 150 MG tablet Take 150 mg every 30 (thirty) days by mouth. Take in the morning with a full glass of water, on an empty stomach, and do not take anything else by mouth or lie down for the next 30 min.  Marland Kitchen ipratropium-albuterol (DUONEB) 0.5-2.5 (3) MG/3ML SOLN ipratropium 0.5 mg-albuterol 3 mg (2.5 mg base)/3 mL nebulization soln  INHALE 3 MLS (1 VIAL) BY NEBULIZATION EVERY 4 (HOURS AS NEEDED FOR WHEEZING.  Marland Kitchen ketoconazole (NIZORAL) 2 % cream Apply topically.  Marland Kitchen levothyroxine (SYNTHROID) 175 MCG tablet 200 mcg.   . meloxicam (MOBIC) 15 MG tablet Take 1 tablet (15 mg total) by mouth every other day.  . montelukast (SINGULAIR) 10 MG tablet Take 10 mg daily by mouth.   . Multiple Vitamin (MULTIVITAMIN WITH MINERALS) TABS tablet Take 1 tablet daily by mouth.  . nystatin (MYCOSTATIN/NYSTOP) powder Apply topically 4 (four) times daily.  Marland Kitchen nystatin cream (MYCOSTATIN) Apply 1 application topically 2 (two) times daily.  . Omega-3 1000 MG CAPS Take 2 g daily by mouth.   . pantoprazole (PROTONIX) 40 MG tablet Take 40 mg by mouth every morning.   . polyvinyl alcohol (LIQUIFILM TEARS) 1.4 % ophthalmic solution Place 1 drop into both eyes as needed for dry eyes.  . potassium chloride (K-DUR) 10 MEQ tablet TAKE 1 TABLET BY MOUTH EVERY DAY  . traMADol (ULTRAM) 50 MG tablet Take 1 tablet (50 mg total) by mouth daily as needed.  . Vitamins/Minerals TABS Take by mouth.     Allergies:   Hydromorphone hcl, Demerol  [meperidine hcl], Other, Codeine, Demerol [meperidine], Diazepam, Tape, Macrobid [nitrofurantoin], Morphine and related, Penicillins, and Valisone [betamethasone]   Social History   Socioeconomic History  . Marital status: Married    Spouse name: Not on file  . Number of children: Not on file  . Years of  education: Not on file  . Highest education level: Not on file  Occupational History  . Not on file  Tobacco Use  . Smoking status: Former Smoker    Packs/day: 2.00    Years: 41.00    Pack years: 82.00    Types: Cigarettes    Quit date: 06/05/1999    Years since  quitting: 20.2  . Smokeless tobacco: Never Used  Substance and Sexual Activity  . Alcohol use: No  . Drug use: No  . Sexual activity: Not on file  Other Topics Concern  . Not on file  Social History Narrative  . Not on file   Social Determinants of Health   Financial Resource Strain:   . Difficulty of Paying Living Expenses:   Food Insecurity:   . Worried About Charity fundraiser in the Last Year:   . Arboriculturist in the Last Year:   Transportation Needs:   . Film/video editor (Medical):   Marland Kitchen Lack of Transportation (Non-Medical):   Physical Activity:   . Days of Exercise per Week:   . Minutes of Exercise per Session:   Stress:   . Feeling of Stress :   Social Connections:   . Frequency of Communication with Friends and Family:   . Frequency of Social Gatherings with Friends and Family:   . Attends Religious Services:   . Active Member of Clubs or Organizations:   . Attends Archivist Meetings:   Marland Kitchen Marital Status:      Family History: The patient's family history includes Alcohol abuse in her brother; Alzheimer's disease in her mother; Breast cancer in her cousin, cousin, and paternal aunt; Cancer (age of onset: 69) in her father; Heart attack in her maternal grandmother.  ROS:   Please see the history of present illness.     All other systems reviewed and are negative.  EKGs/Labs/Other Studies Reviewed:    The following studies were reviewed today: Reports of the echocardiogram, nuclear stress test, 48-hour Holter and echocardiogram performed at [facility]. Notes from Dr. Shirlee More Coronary angiography 2018  EKG:  EKG is  ordered today.  The ekg ordered today demonstrates sinus  rhythm with very frequent monomorphic PVCs (left bundle branch block morphology, transition in lead V2, equiphasic in lead III with normal axis).  Recent Labs: 08/11/2019: Hemoglobin 12.0; Platelets 121  07/08/2019: Glucose 117, creatinine 0.58, potassium 5.0, TSH 1.66 Recent Lipid Panel No results found for: CHOL, TRIG, HDL, CHOLHDL, VLDL, LDLCALC, LDLDIRECT 12/06/2018: Total cholesterol 140, triglycerides 43, HDL 78, LDL 53 Physical Exam:    VS:  BP 138/70   Pulse 81   Ht 5\' 5"  (1.651 m)   Wt 246 lb 3.2 oz (111.7 kg)   SpO2 99%   BMI 40.97 kg/m     Wt Readings from Last 3 Encounters:  09/08/19 246 lb 3.2 oz (111.7 kg)  08/11/19 240 lb (108.9 kg)  05/11/19 248 lb (112.5 kg)     GEN: Morbidly obese, well nourished, well developed in no acute distress HEENT: Normal NECK: No JVD; No carotid bruits LYMPHATICS: No lymphadenopathy CARDIAC: Frequent ectopy on a background of RRR, no murmurs, rubs, gallops RESPIRATORY:  Clear to auscultation without rales, wheezing or rhonchi  ABDOMEN: Soft, non-tender, non-distended MUSCULOSKELETAL:  No edema; No deformity  SKIN: Warm and dry NEUROLOGIC:  Alert and oriented x 3 PSYCHIATRIC:  Normal affect   ASSESSMENT:    1. Chronic diastolic heart failure (HCC)   2. Cardiomyopathy, unspecified type ([place])   3. Frequent unifocal premature ventricular contractions   4. Morbid obesity ([place])   5. Chronic obstructive pulmonary disease, unspecified COPD type ([place])    PLAN:    In order of problems listed above:  1. CHF: There is convincing evidence of diastolic dysfunction with elevated left atrial filling pressure seen on the echo is from  2018 in 2020.  Her LVEDP was elevated at 25 mmHg when she had a cardiac catheterization in 2018. She appears to have at most mild systemic hypertension and is actually not taking any antihypertensive medications.  Need to consider alternative diagnoses including cardiac amyloidosis.  Note strikingly abnormal E: A  =2.4 on echo from [facility].  She will be scheduled for cardiac MRI to look for infiltrative cardiomyopathy amongst other diagnoses.  Might also consider a pyrophosphate scan which would be more specific for TTR amyloidosis.  Would recommend limiting the use of NSAIDs such as meloxicam. 2. PVCs: Extremely high burden of PVCs can also be a cause for her heart failure symptoms, although it is unlikely to explain left ventricular hypertrophy.  PVC morphology suggests possible origin of the ectopic beats from the base of the right ventricle.  Consider unusual presentation of arrhythmogenic right ventricular cardiomyopathy.  Again MRI might be helpful.  She has an appointment with EP later this month.  She is not currently receiving beta-blockers and takes a variety of bronchodilators.  I am not sure how well she would tolerate a medicine such as sotalol or even a conventional beta-1 selective beta-blocker like metoprolol.  We will trial bisoprolol. 3. Dyspnea on exertion: This is very likely to be multifactorial with significant contribution from morbid obesity and COPD/reactive airway disease, in addition to diastolic heart failure.  She has reportedly had pulmonary function tests but I am unable to locate them at this time. 4. Morbid obesity: Very likely contributing to her dyspnea.  Medication Adjustments/Labs and Tests Ordered: Current medicines are reviewed at length with the patient today.  Concerns regarding medicines are outlined above.  Orders Placed This Encounter  Procedures  . MR CARDIAC MORPHOLOGY W WO CONTRAST  . Basic metabolic panel   Meds ordered this encounter  Medications  . bisoprolol (ZEBETA) 5 MG tablet    Sig: Take 1 tablet (5 mg total) by mouth daily.    Dispense:  30 tablet    Refill:  2    Patient Instructions  Medication Instructions:  START Bisoprolol 5 mg once daily  *If you need a refill on your cardiac medications before your next appointment, please call your  pharmacy*   Lab Work: You will need to have a BMET lab drawn prior to the Cardiac MRI. This may be done at any [facility] or the [facility] office. You do not need to be fasting nor do you need an appointment if you have this completed at the office.   Testing/Procedures: Your physician has requested that you have a cardiac MRI. Cardiac MRI uses a computer to create images of your heart as its beating, producing both still and moving pictures of your heart and major blood vessels.  Follow-Up: At [facility], you and your health needs are our priority.  As part of our continuing mission to provide you with exceptional heart care, we have created designated Provider Care Teams.  These Care Teams include your primary Cardiologist (physician) and Advanced Practice Providers (APPs -  Physician Assistants and Nurse Practitioners) who all work together to provide you with the care you need, when you need it.  We recommend signing up for the patient portal called "MyChart".  Sign up information is provided on this After Visit Summary.  MyChart is used to connect with patients for Virtual Visits (Telemedicine).  Patients are able to view lab/test results, encounter notes, upcoming appointments, etc.  Non-urgent messages can be sent to your provider as well.  To learn more about what you can do with MyChart, go to [email or web address].    Your next appointment:   Reschedule the appointment with Dr. Curt Bears after the Cardiac MRI has been completed.     Signed, Sanda Klein, MD  09/10/2019 3:42 PM    [place] [facility]

## 2019-09-08 NOTE — Patient Instructions (Signed)
Medication Instructions:  START Bisoprolol 5 mg once daily  *If you need a refill on your cardiac medications before your next appointment, please call your pharmacy*   Lab Work: You will need to have a BMET lab drawn prior to the Cardiac MRI. This may be done at any [facility] or the [facility] office. You do not need to be fasting nor do you need an appointment if you have this completed at the office.   Testing/Procedures: Your physician has requested that you have a cardiac MRI. Cardiac MRI uses a computer to create images of your heart as its beating, producing both still and moving pictures of your heart and major blood vessels.  Follow-Up: At [facility], you and your health needs are our priority.  As part of our continuing mission to provide you with exceptional heart care, we have created designated Provider Care Teams.  These Care Teams include your primary Cardiologist (physician) and Advanced Practice Providers (APPs -  Physician Assistants and Nurse Practitioners) who all work together to provide you with the care you need, when you need it.  We recommend signing up for the patient portal called "MyChart".  Sign up information is provided on this After Visit Summary.  MyChart is used to connect with patients for Virtual Visits (Telemedicine).  Patients are able to view lab/test results, encounter notes, upcoming appointments, etc.  Non-urgent messages can be sent to your provider as well.   To learn more about what you can do with MyChart, go to [email or web address].    Your next appointment:   Reschedule the appointment with Dr. Curt Bears after the Cardiac MRI has been completed.

## 2019-09-10 ENCOUNTER — Encounter: Payer: Self-pay | Admitting: Cardiovascular Disease

## 2019-09-13 ENCOUNTER — Institutional Professional Consult (permissible substitution): Payer: Medicare HMO | Admitting: Cardiology

## 2019-09-14 ENCOUNTER — Encounter: Payer: Self-pay | Admitting: Cardiovascular Disease

## 2019-09-14 ENCOUNTER — Telehealth: Payer: Self-pay | Admitting: Cardiovascular Disease

## 2019-09-14 NOTE — Telephone Encounter (Signed)
Spoke with patient regarding appointment for Cardiac MRI scheduled Monday 10/17/19 at 8:00 am at [facility]---arrival time is  7:15 am 1st floor radiology.  Patient also given information for consult with Dr. Curt Bears scheduled 10/25/19 at 10:00 am.  Will mail information to patient

## 2019-09-15 ENCOUNTER — Telehealth: Payer: Self-pay | Admitting: *Deleted

## 2019-09-15 ENCOUNTER — Encounter: Payer: Self-pay | Admitting: *Deleted

## 2019-09-15 NOTE — Telephone Encounter (Signed)
Spoke with patient regarding new appointment for Cardiac MRI scheduled on Monday 09/26/19 at 9:00 am at [facility]---arrival time is 8:15am 1st floor admissions office.  Will mail updated information to patient

## 2019-09-21 ENCOUNTER — Telehealth: Payer: Self-pay

## 2019-09-21 NOTE — Telephone Encounter (Signed)
   [facility] Pre-operative Risk Assessment    Request for surgical clearance:  1. What type of surgery is being performed? L reverse shoulder arthroplasty  2. When is this surgery scheduled? tbd  3. What type of clearance is required (medical clearance vs. Pharmacy clearance to hold med vs. Both)? both  4. Are there any medications that need to be held prior to surgery and how long? ASA  5. Practice name and name of physician performing surgery? [facility]  6. What is your office phone number [phone]   7.   What is your office fax number [phone]  8.   Anesthesia type (None, local, MAC, general) ? General and ISblock   [name] 09/21/2019, 12:17 PM  _________________________________________________________________   (provider comments below)

## 2019-09-21 NOTE — Telephone Encounter (Signed)
No. She can go ahead with shoulder surgery, low risk.

## 2019-09-21 NOTE — Telephone Encounter (Signed)
   Primary Cardiologist: Dr  Sallyanne Kuster  Chart reviewed as part of pre-operative protocol coverage. Given past medical history and time since last visit, based on ACC/AHA guidelines, DAVAE NORGREN would be at acceptable risk for the planned procedure without further cardiovascular testing.   OK to hold aspirin 5-7 days pre op if needed.  I will route this recommendation to the requesting party via Epic fax function and remove from pre-op pool.  Please call with questions.  Kerin Ransom, PA-C 09/21/2019, 2:54 PM

## 2019-09-21 NOTE — Telephone Encounter (Signed)
Dr Sallyanne Kuster do you want to review her cardiac MRI (scheduled for 5/3)  before clearing her for shoulder surgery ?   Kerin Ransom PA-C 09/21/2019 2:34 PM

## 2019-09-23 ENCOUNTER — Telehealth (HOSPITAL_COMMUNITY): Payer: Self-pay | Admitting: Emergency Medicine

## 2019-09-23 ENCOUNTER — Encounter (HOSPITAL_COMMUNITY): Payer: Self-pay

## 2019-09-23 NOTE — Telephone Encounter (Signed)
Attempted to call patient regarding upcoming cardiac CT appointment. °Left message on voicemail with name and callback number °Heaven Meeker RN Navigator Cardiac Imaging °[facility] °[phone] Office °[phone] Cell ° °

## 2019-09-26 ENCOUNTER — Ambulatory Visit (HOSPITAL_COMMUNITY)
Admission: RE | Admit: 2019-09-26 | Discharge: 2019-09-26 | Disposition: A | Discharge status: Home / Self Care | Payer: Medicare HMO | Source: Ambulatory Visit | Attending: Cardiovascular Disease | Admitting: Cardiovascular Disease

## 2019-09-26 ENCOUNTER — Encounter (HOSPITAL_COMMUNITY): Payer: Self-pay

## 2019-09-26 ENCOUNTER — Other Ambulatory Visit: Payer: Self-pay

## 2019-09-26 DIAGNOSIS — I429 Cardiomyopathy, unspecified: Secondary | ICD-10-CM

## 2019-09-27 ENCOUNTER — Telehealth: Payer: Self-pay | Admitting: Cardiovascular Disease

## 2019-09-27 NOTE — Addendum Note (Signed)
Addended by: Wonda Horner on: 09/27/2019 01:37 PM   Modules accepted: Orders

## 2019-09-27 NOTE — Telephone Encounter (Signed)
I spoke with patient regarding cardiac mri that was scheduled 09/26/19---she states she could not complete the study due to a panic attack.She did not want to reschedule.

## 2019-09-29 ENCOUNTER — Other Ambulatory Visit: Payer: Self-pay

## 2019-10-05 ENCOUNTER — Telehealth: Payer: Self-pay | Admitting: Cardiovascular Disease

## 2019-10-05 NOTE — Telephone Encounter (Signed)
Patient calling for MRI results.

## 2019-10-05 NOTE — Telephone Encounter (Signed)
Spoke with patient, informed her further testing recommendations can be discussed at office visit with Dr. Curt Bears. Patient verbalized understanding.

## 2019-10-05 NOTE — Telephone Encounter (Signed)
Spoke with patient. She called to see if there were any results from the MRI she did on 5/3. Patient did not complete the MRI in entirety due to a panic attack. She had requested sedation prior to but was not given anything. Patient wants to know if she needs to have the MRI redone or further testing. Will route to cardiologist for review.

## 2019-10-05 NOTE — Telephone Encounter (Signed)
Can discuss w Dr. Curt Bears at f/u this month

## 2019-10-17 ENCOUNTER — Inpatient Hospital Stay (HOSPITAL_COMMUNITY): Admission: RE | Admit: 2019-10-17 | Payer: Medicare HMO | Source: Ambulatory Visit

## 2019-10-18 ENCOUNTER — Other Ambulatory Visit: Payer: Self-pay

## 2019-10-18 ENCOUNTER — Encounter: Payer: Self-pay | Admitting: Cardiology

## 2019-10-18 ENCOUNTER — Ambulatory Visit: Payer: Medicare HMO | Admitting: Cardiology

## 2019-10-18 VITALS — BP 108/62 | HR 63 | Ht 66.0 in | Wt 246.0 lb

## 2019-10-18 DIAGNOSIS — I493 Ventricular premature depolarization: Secondary | ICD-10-CM | POA: Diagnosis not present

## 2019-10-18 MED ORDER — MEXILETINE HCL 150 MG PO CAPS: 150.0000 mg | ORAL_CAPSULE | Freq: Three times a day (TID) | ORAL | 3 refills | Status: DC

## 2019-10-18 NOTE — Patient Instructions (Signed)
Medication Instructions:  Your physician has recommended you make the following change in your medication:  1. START Mexiletine 150 mg twice a day  *If you need a refill on your cardiac medications before your next appointment, please call your pharmacy*   Lab Work: None ordered If you have labs (blood work) drawn today and your tests are completely normal, you will receive your results only by: Marland Kitchen MyChart Message (if you have MyChart) OR . A paper copy in the mail If you have any lab test that is abnormal or we need to change your treatment, we will call you to review the results.   Testing/Procedures: None ordered   Follow-Up: At [facility], you and your health needs are our priority.  As part of our continuing mission to provide you with exceptional heart care, we have created designated Provider Care Teams.  These Care Teams include your primary Cardiologist (physician) and Advanced Practice Providers (APPs -  Physician Assistants and Nurse Practitioners) who all work together to provide you with the care you need, when you need it.  We recommend signing up for the patient portal called "MyChart".  Sign up information is provided on this After Visit Summary.  MyChart is used to connect with patients for Virtual Visits (Telemedicine).  Patients are able to view lab/test results, encounter notes, upcoming appointments, etc.  Non-urgent messages can be sent to your provider as well.   To learn more about what you can do with MyChart, go to [email or web address].    Your next appointment:   3 month(s)  The format for your next appointment:   In Person  Provider:   Allegra Lai, MD   Thank you for choosing [facility]!!   Trinidad Curet, RN [phone]    Other Instructions  Mexiletine capsules What is this medicine? MEXILETINE (mex IL e teen) is an antiarrhythmic agent. This medicine is used to treat irregular heart rhythm and can slow rapid heartbeats. It can  help your heart to return to and maintain a normal rhythm. Because of the side effects caused by this medicine, it is usually used for heartbeat problems that may be life-threatening. This medicine may be used for other purposes; ask your health care provider or pharmacist if you have questions. COMMON BRAND NAME(S): Mexitil What should I tell my health care provider before I take this medicine? They need to know if you have any of these conditions:  liver disease  other heart problems  previous heart attack  an unusual or allergic reaction to mexiletine, other medicines, foods, dyes, or preservatives  pregnant or trying to get pregnant  breast-feeding How should I use this medicine? Take this medicine by mouth with a glass of water. Follow the directions on the prescription label. It is recommended that you take this medicine with food or an antacid. Take your doses at regular intervals. Do not take your medicine more often than directed. Do not stop taking except on the advice of your doctor or health care professional. Talk to your pediatrician regarding the use of this medicine in children. Special care may be needed. Overdosage: If you think you have taken too much of this medicine contact a poison control center or emergency room at once. NOTE: This medicine is only for you. Do not share this medicine with others. What if I miss a dose? If you miss a dose, take it as soon as you can. If it is almost time for your next dose, take only that  dose. Do not take double or extra doses. What may interact with this medicine? Do not take this medicine with any of the following medications:  dofetilide This medicine may also interact with the following medications:  caffeine  cimetidine  medicines for depression, anxiety, or psychotic disturbances  medicines to control heart rhythm  phenobarbital  phenytoin  rifampin  theophylline This list may not describe all possible  interactions. Give your health care provider a list of all the medicines, herbs, non-prescription drugs, or dietary supplements you use. Also tell them if you smoke, drink alcohol, or use illegal drugs. Some items may interact with your medicine. What should I watch for while using this medicine? Your condition will be monitored closely when you first begin therapy. Often, this drug is first started in a hospital or other monitored health care setting. Once you are on maintenance therapy, visit your doctor or health care provider for regular checks on your progress. Because your condition and use of this medicine carry some risk, it is a good idea to carry an identification card, necklace or bracelet with details of your condition, medications, and doctor or health care provider. You may get drowsy or dizzy. Do not drive, use machinery, or do anything that needs mental alertness until you know how this medicine affects you. Do not stand or sit up quickly, especially if you are an older patient. This reduces the risk of dizzy or fainting spells. Alcohol can make you more dizzy, increase flushing and rapid heartbeats. Avoid alcoholic drinks. This medicine may cause serious skin reactions. They can happen weeks to months after starting the medicine. Contact your health care provider right away if you notice fevers or flu-like symptoms with a rash. The rash may be red or purple and then turn into blisters or peeling of the skin. Or, you might notice a red rash with swelling of the face, lips or lymph nodes in your neck or under your arms. What side effects may I notice from receiving this medicine? Side effects that you should report to your doctor or health care professional as soon as possible:  allergic reactions like skin rash, itching or hives, swelling of the face, lips, or tongue  breathing problems  chest pain, continued irregular heartbeats  rash, fever, and swollen lymph nodes  redness,  blistering, peeling or loosening of the skin, including inside the mouth  seizures  skin rash  trembling, shaking  unusual bleeding or bruising  unusually weak or tired Side effects that usually do not require medical attention (report to your doctor or health care professional if they continue or are bothersome):  blurred vision  difficulty walking  heartburn  nausea, vomiting  nervousness  numbness, or tingling in the fingers or toes This list may not describe all possible side effects. Call your doctor for medical advice about side effects. You may report side effects to FDA at 1-800-FDA-1088. Where should I keep my medicine? Keep out of reach of children. Store at room temperature between 15 and 30 degrees C (59 and 86 degrees F). Throw away any unused medicine after the expiration date. NOTE: This sheet is a summary. It may not cover all possible information. If you have questions about this medicine, talk to your doctor, pharmacist, or health care provider.  2020 Elsevier/Gold Standard (2018-08-18 09:25:42)

## 2019-10-18 NOTE — Progress Notes (Signed)
Electrophysiology Office Note   Date:  10/18/2019   ID:  [name], DOB 1946/01/30, MRN [id number]  PCP:  Chesley Noon, MD  Cardiologist:  [place] Primary Electrophysiologist:  Grayson White Meredith Leeds, MD    Chief Complaint: PVC   History of Present Illness: [name] is a 74 y.o. female who is being seen today for the evaluation of PVC at the request of Chesley Noon, MD. Presenting today for electrophysiology evaluation.  She has a history of diastolic heart failure, mild nonobstructive coronary artery disease on cath 2018, and PVCs.  She had a normal Myoview 12 2020.  She wore a 48-hour monitor which showed 30% PVCs.  Patient's main complaint is shortness of breath.  No chest pain, orthopnea, PND, or syncope.    She has had an extensive evaluation for shortness of breath at [facility].  She had an echo that showed a normal ejection fraction and a Myoview without evidence of ischemia.  She did have Holter monitor that showed an elevated PVC burden at approximately 30%.  She is transferring her care to [facility].  Today, she denies symptoms of palpitations, chest pain, orthopnea, PND, lower extremity edema, claudication, dizziness, presyncope, syncope, bleeding, or neurologic sequela. The patient is tolerating medications without difficulties.  Her main complaint is dyspnea on exertion.  She says that she cannot do very much without getting quite short of breath.  She feels okay at rest, but every time she tries to be active, she is quite limited.  It is unclear whether or not her sedentary lifestyle is due to her shortness of breath or another factor.   Past Medical History:  Diagnosis Date  . ACS (acute coronary syndrome) ([place]) 04/11/2017  . ALLERGIC RHINITIS 08/23/2007   Qualifier: Diagnosis of  By: Doy Mince LPN, Megan    . Anxiety   . Arthritis   . Asthma   . ASTHMA 08/23/2007   Qualifier: Diagnosis of  By: Doy Mince LPN, Megan    . Bigeminy 04/10/2017  . 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 Cancer of right breast, stage 2 ([place]) 05/28/2011   2.8 cm invasive 1 node pos ER pos S/P mastectomy 06/1995 then AC x 4 then Tam X 5   . Chest pain, rule out acute myocardial infarction 04/10/2017  . Chronic constipation   . Chronic cystitis 04/09/2017  . Cough 08/23/2007   Qualifier: Diagnosis of  By: Gwenette Greet MD, Armando Reichert   . Depression   . 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 Fibromyalgia   . GERD (gastroesophageal reflux disease)   . History of adenomatous polyp of colon 09/30/2018  . 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 History of colon polyps    2007 hyperplagia  . History of esophageal dilatation    2008  . History of peptic ulcer   . History of small bowel obstruction    2012  . Hypothyroid 05/28/2011  . Hypothyroidism   . Incomplete emptying of bladder 07/30/2016  . Left foot pain 12/11/2015   Overview:  Dr Cyril Mourning [facility]  . Macular degeneration of right eye   . Mild depression ([place]) 05/28/2011  . Mixed incontinence urge and stress 09/16/2011  S/p lumax desires surgery   . Morbid obesity ([place]) 04/10/2011  . Numbness of left foot    4 toes  . Osteoporosis 12/15/2016   Overview:  Dr Cletis Media  . Paget's disease of vulva   . Personal history of chemotherapy 1997  . Personal history of radiation therapy 2005  . PONV (postoperative nausea and vomiting)    and HARD TO WAKE  . Seasonal allergies   . Shortness of breath   . SKIN CANCER, HX OF 08/23/2007   Qualifier: Diagnosis of  By: Doy Mince LPN, Megan    . SUI (stress urinary incontinence, female)   . Thrombocytopenic disorder ([place]) 02/25/2017  . Wears glasses    Past Surgical History:  Procedure Laterality  Date  . BLADDER SUSPENSION  10/13/2011   Procedure: TRANSVAGINAL TAPE (TVT) PROCEDURE;  Surgeon: Delice Lesch, MD;  Location: [facility];  Service: Gynecology;  Laterality: N/A;  . BREAST BIOPSY Left 03/19/2004  . BREAST LUMPECTOMY Left 03-27-2004  . CARDIAC CATHETERIZATION  10-30-2000  dr Wynonia Lawman   normal coronary arteries and LVF/  ef 65-70%  . CLOSED MANIPULATION  W/ OPEN REDUCTION EXCHANGE TIBIAL FEMORAL BEARING POST TOTAL LEFT KNEE  04-14-2001  . CYSTOSCOPY  10/13/2011   Procedure: CYSTOSCOPY;  Surgeon: Delice Lesch, MD;  Location: [facility];  Service: Gynecology;  Laterality: Bilateral;  . DILATION AND CURETTAGE OF UTERUS  1968  . FOOT SURGERY Left 02-01-2009   TRIPLE ARTHRODESIS  . FOOT SURGERY Left 11/14   I&D left foot with woundvac placement  . INCISIONAL HERNIA REPAIR  05/05/2011   Procedure: LAPAROSCOPIC INCISIONAL HERNIA;  Surgeon: Edward Jolly, MD;  Location: [facility];  Service: General;  Laterality: N/A;  LAPAROSCOPIC REPAIR INCARCERATED INCISIONAL HERNIA  WITH MESH  . KNEE ARTHROSCOPY Bilateral left [id number] & 04-1992/   right [phone]  . LAPAROSCOPIC CHOLECYSTECTOMY  06-21-2010  . LEFT HEART CATH AND CORONARY ANGIOGRAPHY N/A 04/13/2017   Procedure: LEFT HEART CATH AND CORONARY ANGIOGRAPHY;  Surgeon: Jettie Booze, MD;  Location: [facility];  Service: Cardiovascular;  Laterality: N/A;  . LUMBAR SPINE SURGERY  1998   L5 -- S1  . MASTECTOMY Right 1997   W/  LYMPH NODE DISSECTION AND RECONSTRUCTION WITH IMPLANTS AND EXPANDER  . NEGATIVE SLEEP STUDY  12/26/2015  . PORT-A-CATH PLACEMENT AND REMOVAL  1997  . REMOVAL RIGHT BREAST IMPLANT AND CAPSULECTOMY  06-03-1999  . REVISION TOTAL KNEE ARTHROPLASTY Left 07-07-2005  &  12-10-2001   12-10-2001  REMOVAL TOTAL KNEE/ I&D / INSERTION ANTIBIOTIC SPACER  . REVISION TOTAL KNEE ARTHROPLASTY Right 07-07-2005  . ROTATOR CUFF REPAIR Right   . TENDON REPAIR RIGHT RING FINGER  2011  . THUMB TRIGGER RELEASE Right 1993  .  THYROIDECTOMY, PARTIAL  1983  . TONSILLECTOMY  1968  . TOTAL KNEE ARTHROPLASTY Bilateral right 05-04-2000/  left 03-29-2001  . VULVECTOMY  05/27/2012   Procedure: WIDE EXCISION VULVECTOMY;  Surgeon: Imagene Gurney A. Alycia Rossetti, MD;  Location: [facility];  Service: Gynecology;  Laterality: N/A;  Wide Local Excision of Vulva   . VULVECTOMY N/A 11/23/2012   Procedure: WIDE LOCAL EXCISION OF THE VULVA;  Surgeon: Imagene Gurney A. Alycia Rossetti, MD;  Location: [facility];  Service: Gynecology;  Laterality: N/A;  left inferior vulvar biopsy excision left superior lesion left inferior vulvar excision  . VULVECTOMY N/A 08/02/2013   Procedure: WIDE LOCAL  EXCISION VULVA;  Surgeon: Imagene Gurney A. Alycia Rossetti, MD;  Location: [facility];  Service: Gynecology;  Laterality: N/A;  . VULVECTOMY Left  03/29/2014   Procedure: WIDE LOCAL EXCISION OF LEFT VULVA ;  Surgeon: Imagene Gurney A. Alycia Rossetti, MD;  Location: [facility];  Service: Gynecology;  Laterality: Left;  Marland Kitchen VULVECTOMY Bilateral 01/24/2015   Procedure: WIDE LOCAL EXCISION VULVA;  Surgeon: [name], MD;  Location: [facility];  Service: Gynecology;  Laterality: Bilateral;  . VULVECTOMY PARTIAL  07-16-2010     Current Outpatient Medications  Medication Sig Dispense Refill  . acetaminophen (TYLENOL) 500 MG tablet Take 1,000 mg by mouth every 6 (six) hours as needed for mild pain or moderate pain.    Marland Kitchen albuterol (PROVENTIL HFA;VENTOLIN HFA) 108 (90 BASE) MCG/ACT inhaler Inhale 2 puffs into the lungs every 4 (four) hours as needed for wheezing or shortness of breath. Reported on 08/01/2015    . aspirin EC 81 MG EC tablet Take 1 tablet (81 mg total) daily by mouth. 30 tablet 0  . atorvastatin (LIPITOR) 40 MG tablet Take 1 tablet (40 mg total) daily at 6 PM by mouth. 30 tablet 0  . bisoprolol (ZEBETA) 5 MG tablet Take 1 tablet (5 mg total) by mouth daily. 30 tablet 2  . budesonide-formoterol (SYMBICORT) 160-4.5 MCG/ACT inhaler Inhale 2 puffs into the lungs daily as needed (Wheezing in the  winter).     Marland Kitchen buPROPion (WELLBUTRIN XL) 150 MG 24 hr tablet Take 150 mg by mouth every morning.     . Cholecalciferol (VITAMIN D3) 1000 UNITS CAPS Take 2,000 Units by mouth daily.     . citalopram (CELEXA) 40 MG tablet Take 40 mg by mouth every morning.     . clindamycin (CLEOCIN) 150 MG capsule Take 4 tablets 30 minutesprior to dental procedure.    . furosemide (LASIX) 40 MG tablet Take 40 mg by mouth daily. Take additional an 40 mg dose if needed for fluid    . hydrOXYzine (VISTARIL) 25 MG capsule Take 25 mg by mouth 3 (three) times daily as needed for itching.    . ibandronate (BONIVA) 150 MG tablet Take 150 mg every 30 (thirty) days by mouth. Take in the morning with a full glass of water, on an empty stomach, and do not take anything else by mouth or lie down for the next 30 min.    Marland Kitchen ipratropium-albuterol (DUONEB) 0.5-2.5 (3) MG/3ML SOLN Take 3 mLs by nebulization every 4 (four) hours as needed (Wheezing).     Marland Kitchen ketoconazole (NIZORAL) 2 % shampoo Apply 1 application topically every 14 (fourteen) days.    Marland Kitchen levothyroxine (SYNTHROID) 200 MCG tablet Take 200 mcg by mouth daily before breakfast.     . meloxicam (MOBIC) 15 MG tablet Take 1 tablet (15 mg total) by mouth every other day. 30 tablet 0  . montelukast (SINGULAIR) 10 MG tablet Take 10 mg daily by mouth.     . Multiple Vitamin (MULTIVITAMIN WITH MINERALS) TABS tablet Take 1 tablet daily by mouth.    . nystatin (MYCOSTATIN/NYSTOP) powder Apply topically 4 (four) times daily. 60 g 6  . nystatin cream (MYCOSTATIN) Apply 1 application topically 2 (two) times daily. 30 g 6  . Omega-3 1000 MG CAPS Take 1,000 mg by mouth daily.     . pantoprazole (PROTONIX) 40 MG tablet Take 40 mg by mouth daily.     . polyvinyl alcohol (LIQUIFILM TEARS) 1.4 % ophthalmic solution Place 1 drop into both eyes daily as needed for dry eyes.     . potassium chloride (K-DUR) 10 MEQ tablet Take 10 mEq by mouth daily.     Marland Kitchen  traMADol (ULTRAM) 50 MG tablet Take 1  tablet (50 mg total) by mouth daily as needed. 20 tablet 0  . mexiletine (MEXITIL) 150 MG capsule Take 1 capsule (150 mg total) by mouth 3 (three) times daily. 60 capsule 3   No current facility-administered medications for this visit.    Allergies:   Hydromorphone hcl, Demerol  [meperidine hcl], Other, Codeine, Demerol [meperidine], Diazepam, Tape, Macrobid [nitrofurantoin], Morphine and related, Penicillins, and Valisone [betamethasone]   Social History:  The patient  reports that she quit smoking about 20 years ago. Her smoking use included cigarettes. She has a 82.00 pack-year smoking history. She has never used smokeless tobacco. She reports that she does not drink alcohol or use drugs.   Family History:  The patient's family history includes Alcohol abuse in her brother; Alzheimer's disease in her mother; Breast cancer in her cousin, cousin, and paternal aunt; Cancer (age of onset: 9) in her father; Heart attack in her maternal grandmother.    ROS:  Please see the history of present illness.   Otherwise, review of systems is positive for none.   All other systems are reviewed and negative.    PHYSICAL EXAM: VS:  BP 108/62   Pulse 63   Ht 5\' 6"  (1.676 m)   Wt 246 lb (111.6 kg)   SpO2 95%   BMI 39.71 kg/m  , BMI Body mass index is 39.71 kg/m. GEN: Well nourished, well developed, in no acute distress  HEENT: normal  Neck: no JVD, carotid bruits, or masses Cardiac: RRR; no murmurs, rubs, or gallops,no edema  Respiratory:  clear to auscultation bilaterally, normal work of breathing GI: soft, nontender, nondistended, + BS MS: no deformity or atrophy  Skin: warm and dry Neuro:  Strength and sensation are intact Psych: euthymic mood, full affect  EKG:  EKG is ordered today. Personal review of the ekg ordered shows anus rhythm, PVC  Recent Labs: 08/11/2019: Hemoglobin 12.0; Platelets 121    Lipid Panel  No results found for: CHOL, TRIG, HDL, CHOLHDL, VLDL, LDLCALC,  LDLDIRECT   Wt Readings from Last 3 Encounters:  10/18/19 246 lb (111.6 kg)  09/08/19 246 lb 3.2 oz (111.7 kg)  08/11/19 240 lb (108.9 kg)      Other studies Reviewed: Additional studies/ records that were reviewed today include: TTE  07/13/19 Review of the above records today demonstrates:  Wall thickness is normal. Systolic  function is low normal. EF: 50-55%. Wall motion is within normal limits.  No apical thrombus Doppler parameters consistent with restrictive filling  pattern and markedly elevated LA pressure. Mitral valve E/A ratio 2.4.  Mitral valve structure is normal. There is mild to moderate mitral  regurgitation.  Holter 05/26/19 personally reviewed Patient monitored for 48 hours. Analysis and rhythm strips revealed sinus rhythm at average rate 77 beats/min. Minimum heart rate 66 beats/min. Maximum heart rate 120 beats/min. No atrial fibrillation noted. Frequent premature ventricular contractions (30.6% of readable data). Couplets, bigeminy and trigeminy noted. No atrial fibrillation noted. There were no pauses > 3 seconds in duration.  ASSESSMENT AND PLAN:  1.  PVCs: High burden of 30% on Holter monitor.  Her ECG today shows minimal PVCs.  We Alexsandro Salek try to get results from her monitor sent over from [facility].  PVCs appear to be coming from the RV base, though they are a little atypical for outflow tract PVCs.  We Aarika Moon start her on mexiletine today to see if we can reduce her PVC burden.  Would prefer  to avoid beta-blockers.  She has tried bisoprolol and not tolerated this.  But for ablation at this time.  2.  Chronic diastolic heart failure: She Niambi Smoak likely need further evaluation.  She had an attempted MRI, though she did not tolerate this due to claustrophobia.  I would discuss this with her primary cardiologist and see if she may need some anxiolytics prior to another attempt at MRI.  3.  Dyspnea on exertion: This is likely multifactorial.  Likely due to morbid obesity,  COPD, diastolic heart failure, and PVCs.  She is also likely to have obstructive sleep apnea and may require evaluation for this as well.    Current medicines are reviewed at length with the patient today.   The patient does not have concerns regarding her medicines.  The following changes were made today: Start mexiletine  Labs/ tests ordered today include:  Orders Placed This Encounter  Procedures  . EKG 12-Lead     Disposition:   FU with Altovise Wahler 3 months  Signed, Cayden Rautio Meredith Leeds, MD  10/18/2019 1:56 PM     [address] [place] [id number] [phone] (office) [phone] (fax)

## 2019-10-19 ENCOUNTER — Telehealth: Payer: Self-pay | Admitting: Cardiology

## 2019-10-19 NOTE — Telephone Encounter (Signed)
Medical records requested from [facility]. 10/19/19 vlm

## 2019-10-20 NOTE — Pre-Procedure Instructions (Signed)
Your procedure is scheduled on Tuesday, June 1st, 2021 from 07:30 AM- 11:11 AM.  Report to [facility]tr[facility] "A" at 05:30 A.M., and check in at the Admitting office.  Call this number if you have problems the morning of surgery:  [phone]  Call [phone] if you have any questions prior to your surgery date Monday-Friday 8am-4pm.    Remember:  Do not eat after midnight the night before your surgery.  You may drink clear liquids until 04:30 AM the morning of your surgery.   Clear liquids allowed are: Water, Non-Citrus Juices (without pulp), Carbonated Beverages, Clear Tea, Black Coffee Only, and Gatorade.  Please complete your PRE-SURGERY ENSURE that was provided to you by 04:30 AM the morning of surgery.  Please, if able, drink it in one setting. DO NOT SIP.     Take these medicines the morning of surgery with A SIP OF WATER: bisoprolol (ZEBETA) buPROPion (WELLBUTRIN XL) citalopram (CELEXA)  levothyroxine (SYNTHROID) mexiletine (MEXITIL) montelukast (SINGULAIR) pantoprazole (PROTONIX)  IF NEEDED: acetaminophen (TYLENOL) traMADol (ULTRAM)  polyvinyl alcohol (LIQUIFILM TEARS) eye drops ipratropium-albuterol (DUONEB) breathing treatment budesonide-formoterol (SYMBICORT) inhaler albuterol (PROVENTIL HFA;VENTOLIN HFA) inhaler  *Please bring all inhalers with you the day of surgery.*   *Follow your surgeon's instructions on when to stop Aspirin.  If no instructions were given by your surgeon then you will need to call the office to get those instructions.    As of today, STOP taking any Aspirin (unless otherwise instructed by your surgeon) and Aspirin containing products, Aleve, Naproxen, meloxicam (MOBIC), Ibuprofen, Motrin, Advil, Goody's, BC's, all herbal medications, fish oil, and all vitamins.   The Morning of Surgery:                    Do not wear jewelry, make up, or nail polish.            Do not wear lotions, powders, perfumes, or deodorant.             Do not shave 48 hours prior to surgery.              Do not bring valuables to the hospital.            [facility] is not responsible for any belongings or valuables.  Do NOT Smoke (Tobacco/Vapping) or drink Alcohol 24 hours prior to your procedure.  If you use a CPAP at night, you may bring all equipment for your overnight stay.   Contacts, glasses, dentures or bridgework may not be worn into surgery.      For patients admitted to the hospital, discharge time will be determined by your treatment team.   Patients discharged the day of surgery will not be allowed to drive home, and someone needs to stay with them for 24 hours.                                    [place]- Preparing for Total Shoulder Arthroplasty   Before surgery, you can play an important role. Because skin is not sterile, your skin needs to be as free of germs as possible. You can reduce the number of germs on your skin by using the following products. . Benzoyl Peroxide Gel o Reduces the number of germs present on the skin o Applied twice a day to shoulder area starting two days before surgery   . Chlorhexidine Gluconate (CHG) Soap o  An antiseptic cleaner that kills germs and bonds with the skin to continue killing germs even after washing o Used for showering the night before surgery and morning of surgery   Oral Hygiene is also important to reduce your risk of infection.                                    Remember - BRUSH YOUR TEETH THE MORNING OF SURGERY WITH YOUR REGULAR TOOTHPASTE  ==================================================================  Please follow these instructions carefully:  BENZOYL PEROXIDE 5% GEL  Please do not use if you have an allergy to benzoyl peroxide.   If your skin becomes reddened/irritated stop using the benzoyl peroxide.  Starting two days before surgery, apply as follows: 1. Apply benzoyl peroxide in the morning and at night. Apply after taking a shower. If you are not  taking a shower clean entire shoulder front, back, and side along with the armpit with a clean wet washcloth.  2. Place a quarter-sized dollop on your shoulder and rub in thoroughly, making sure to cover the front, back, and side of your shoulder, along with the armpit.   2 days before ____ AM   ____ PM              1 day before ____ AM   ____ PM                          3.  Do this twice a day for two days.  (Last application is the night before surgery, AFTER using the CHG soap as described below).  4. Do NOT apply benzoyl peroxide gel on the day of surgery.  CHLORHEXIDINE GLUCONATE (CHG) SOAP  Please do not use if you have an allergy to CHG or antibacterial soaps. If your skin becomes reddened/irritated stop using the CHG.   Do not shave (including legs and underarms) for at least 48 hours prior to first CHG shower. It is OK to shave your face.  Starting the night before surgery, use CHG soap as follows:  1. Shower the NIGHT BEFORE SURGERY and MORNING OF SURGERY with CHG.  2. If you choose to wash your hair, wash your hair first as usual with your normal shampoo.  3. After shampooing, rinse your hair and body thoroughly to remove the shampoo.  4. Use CHG as you would any other liquid soap.  You can apply CHG directly to the skin and wash gently with a scrungie or a clean washcloth.  5. Apply the CHG soap to your body ONLY FROM THE NECK DOWN.  Do not use on open wounds or open sores.  Avoid contact with your eyes, ears, mouth, and genitals (private parts).  Wash face and genitals (private parts) with your normal soap.  6. Wash thoroughly, paying special attention to the area where your surgery will be performed.  7. Thoroughly rinse your body with warm water from the neck down.  8. DO NOT shower/wash with your normal soap after using and rinsing off the CHG soap.   9. Pat yourself dry with a CLEAN TOWEL.   10.  Apply benzoyl peroxide.   11. Wear CLEAN PAJAMAS to bed the  night before surgery; wear comfortable clothes the morning of surgery.  12. Place CLEAN SHEETS on your bed the night of your first shower and DO NOT SLEEP WITH PETS.  Day of Surgery: Shower  as above. Do not apply any deodorants/lotions.  Please wear clean clothes to the hospital/surgery center.   Remember to brush your teeth WITH YOUR REGULAR TOOTHPASTE.

## 2019-10-21 ENCOUNTER — Other Ambulatory Visit: Payer: Self-pay

## 2019-10-21 ENCOUNTER — Encounter (HOSPITAL_COMMUNITY): Payer: Self-pay

## 2019-10-21 ENCOUNTER — Encounter (HOSPITAL_COMMUNITY)
Admission: RE | Admit: 2019-10-21 | Discharge: 2019-10-21 | Disposition: A | Discharge status: Home / Self Care | Payer: Medicare HMO | Source: Ambulatory Visit | Attending: Orthopedic Surgery | Admitting: Orthopedic Surgery

## 2019-10-21 ENCOUNTER — Other Ambulatory Visit (HOSPITAL_COMMUNITY)
Admission: RE | Admit: 2019-10-21 | Discharge: 2019-10-21 | Disposition: A | Discharge status: Home / Self Care | Payer: Medicare HMO | Source: Ambulatory Visit | Attending: Orthopedic Surgery | Admitting: Orthopedic Surgery

## 2019-10-21 DIAGNOSIS — F329 Major depressive disorder, single episode, unspecified: Secondary | ICD-10-CM | POA: Diagnosis not present

## 2019-10-21 DIAGNOSIS — F419 Anxiety disorder, unspecified: Secondary | ICD-10-CM | POA: Insufficient documentation

## 2019-10-21 DIAGNOSIS — Z7902 Long term (current) use of antithrombotics/antiplatelets: Secondary | ICD-10-CM | POA: Insufficient documentation

## 2019-10-21 DIAGNOSIS — E039 Hypothyroidism, unspecified: Secondary | ICD-10-CM | POA: Diagnosis not present

## 2019-10-21 DIAGNOSIS — Z7901 Long term (current) use of anticoagulants: Secondary | ICD-10-CM | POA: Insufficient documentation

## 2019-10-21 DIAGNOSIS — I2510 Atherosclerotic heart disease of native coronary artery without angina pectoris: Secondary | ICD-10-CM | POA: Diagnosis not present

## 2019-10-21 DIAGNOSIS — Z7982 Long term (current) use of aspirin: Secondary | ICD-10-CM | POA: Insufficient documentation

## 2019-10-21 DIAGNOSIS — Z79899 Other long term (current) drug therapy: Secondary | ICD-10-CM | POA: Diagnosis not present

## 2019-10-21 DIAGNOSIS — M797 Fibromyalgia: Secondary | ICD-10-CM | POA: Insufficient documentation

## 2019-10-21 DIAGNOSIS — I509 Heart failure, unspecified: Secondary | ICD-10-CM | POA: Diagnosis not present

## 2019-10-21 DIAGNOSIS — Z01812 Encounter for preprocedural laboratory examination: Secondary | ICD-10-CM | POA: Diagnosis present

## 2019-10-21 DIAGNOSIS — M47816 Spondylosis without myelopathy or radiculopathy, lumbar region: Secondary | ICD-10-CM | POA: Diagnosis not present

## 2019-10-21 DIAGNOSIS — Z20822 Contact with and (suspected) exposure to covid-19: Secondary | ICD-10-CM | POA: Insufficient documentation

## 2019-10-21 DIAGNOSIS — M19012 Primary osteoarthritis, left shoulder: Secondary | ICD-10-CM | POA: Diagnosis not present

## 2019-10-21 DIAGNOSIS — K219 Gastro-esophageal reflux disease without esophagitis: Secondary | ICD-10-CM | POA: Diagnosis not present

## 2019-10-21 LAB — URINALYSIS, ROUTINE W REFLEX MICROSCOPIC
Bacteria, UA: NONE SEEN
Bilirubin Urine: NEGATIVE
Glucose, UA: NEGATIVE mg/dL
Ketones, ur: NEGATIVE mg/dL
Leukocytes,Ua: NEGATIVE
Nitrite: NEGATIVE
Protein, ur: NEGATIVE mg/dL
Specific Gravity, Urine: 1.006 (ref 1.005–1.030)
pH: 7 (ref 5.0–8.0)

## 2019-10-21 LAB — CBC
HCT: 41.3 % (ref 36.0–46.0)
Hemoglobin: 12.9 g/dL (ref 12.0–15.0)
MCH: 27.9 pg (ref 26.0–34.0)
MCHC: 31.2 g/dL (ref 30.0–36.0)
MCV: 89.2 fL (ref 80.0–100.0)
Platelets: 134 10*3/uL — ABNORMAL LOW (ref 150–400)
RBC: 4.63 MIL/uL (ref 3.87–5.11)
RDW: 14.6 % (ref 11.5–15.5)
WBC: 4.2 10*3/uL (ref 4.0–10.5)
nRBC: 0 % (ref 0.0–0.2)

## 2019-10-21 LAB — SURGICAL PCR SCREEN
MRSA, PCR: NEGATIVE
Staphylococcus aureus: NEGATIVE

## 2019-10-21 LAB — BASIC METABOLIC PANEL
Anion gap: 10 (ref 5–15)
BUN: 17 mg/dL (ref 8–23)
CO2: 29 mmol/L (ref 22–32)
Calcium: 9.1 mg/dL (ref 8.9–10.3)
Chloride: 103 mmol/L (ref 98–111)
Creatinine, Ser: 0.68 mg/dL (ref 0.44–1.00)
GFR calc Af Amer: >60 mL/min (ref >60)
GFR calc non Af Amer: >60 mL/min (ref >60)
Glucose, Bld: 107 mg/dL — ABNORMAL HIGH (ref 70–99)
Potassium: 4.1 mmol/L (ref 3.5–5.1)
Sodium: 142 mmol/L (ref 135–145)

## 2019-10-21 LAB — SARS CORONAVIRUS 2 (TAT 6-24 HRS): SARS Coronavirus 2: NEGATIVE

## 2019-10-21 MED ORDER — VANCOMYCIN HCL 1500 MG/300ML IV SOLN
1500.0000 mg | INTRAVENOUS | Status: AC
  Administered 2019-10-25: 1500 mg via INTRAVENOUS
  Filled 2019-10-21 (×2): qty 300

## 2019-10-21 NOTE — Progress Notes (Signed)
Anesthesia Chart Review:   Case: [id number] Date/Time: 10/25/19 0715   Procedure: LEFT REVERSE SHOULDER ARTHROPLASTY (Left Shoulder)   Anesthesia type: General   Pre-op diagnosis: left shoulder osteoarthritis   Location: [facility] OR ROOM 06 / [facility]   Surgeons: Meredith Pel, MD      DISCUSSION:  - Pt is a 74 years old with hx CAD (mild nonobstructive by 2018 cath), CHF (pt is scheduled for cardiac MRI to look for infiltrative cardiomyopathy; per Dr. Sallyanne Kuster, pt can have surgery prior to getting MRI), bigeminy (high burden of  PVCs), asthma, thrombocytopenia  - Hx lists "dysrhythmia - had afib"; cardiac event monitor in 2018 showed 30% PVCs  - Sees hematology for thrombocytopenia. Note from Dr. 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   - Pt has cardiac clearance for surgery from Dr. Sallyanne Kuster   VS: BP 129/74   Pulse 93   Temp 36.7 C (Oral)   Resp 20   Ht 5\' 5"  (1.651 m)   Wt 112.4 kg   SpO2 97%   BMI 41.24 kg/m    PROVIDERS: - Roe Coombs, PA is PCP (notes in care everywhere)  - EP Cardiologist is Will Curt Bears, MD. Started on mexiletine for high burden PVCs - Cardiologist is Sanda Klein, MD who has cleared pt for surgery. Last office visit 09/08/19 - Hematologist is Jorje Guild, MD (notes in care everywhere)     LABS: Labs reviewed: Acceptable for surgery. (all labs ordered are listed, but only abnormal results are displayed)  Labs Reviewed  BASIC METABOLIC PANEL - Abnormal; Notable for the following components:      Result Value   Glucose, Bld 107 (*)    All other components within normal limits  CBC - Abnormal; Notable for the following components:   Platelets 134 (*)    All other components within normal limits  URINALYSIS, ROUTINE W REFLEX MICROSCOPIC - Abnormal; Notable for the following components:    Color, Urine STRAW (*)    Hgb urine dipstick SMALL (*)    All other components within normal limits  SURGICAL PCR SCREEN  URINE CULTURE    EKG 10/18/19: sinus rhythm, PVCs   CV:  Holter 05/26/19 (care everywhere): Patient monitored for 48 hours. Analysis and rhythm strips revealed sinus rhythm at average rate 77 beats/min. Minimum heart rate 66 beats/min. Maximum heart rate 120 beats/min. No atrial fibrillation noted. Frequent premature ventricular contractions (30.6% of readable data). Couplets, bigeminy and trigeminy noted. No atrial fibrillation noted. There were no pauses > 3 seconds in duration   Nuclear stress test 05/17/19 (care everywhere):  1. Pharmacologic stress protocol performed with Lexiscan due to poor exercise tolerance 2. Moderately reduced left ventricular ejection fraction 36%. Diffuse hypokinesis of the left ventricle 3. Normal Cardiolite SPECT study. No myocardial ischemia noted on this pharmacologic stress protocol   Cardiac cath 04/13/17:   Ost LM to Mid LM lesion is 10% stenosed.  Mid LAD lesion is 10% stenosed.  The left ventricular ejection fraction is approximately 40-45% by visual estimate.  LV end diastolic pressure is moderately elevated.  There is mild left ventricular systolic dysfunction.  There is no aortic valve stenosis.   Echo 04/11/17:  - Left ventricle: The cavity size was normal. Wall thickness was increased in a pattern of moderate LVH. Systolic function was normal. The estimated ejection fraction was  in the range of 60% to 65%. Wall motion was normal; there were no regional wall motion abnormalities. Doppler parameters are consistent with pseudonormal left ventricular relaxation (grade 2 diastolic dysfunction). The E/e&' ratio is >15, suggesting elevated LV filling pressure.  - Aortic valve: Trileaflet. Sclerosis without stenosis. There was trivial regurgitation.  - Mitral valve: Mildly thickened leaflets . There was mild to moderate  regurgitation.  - Left atrium: The atrium was mildly dilated.  - Tricuspid valve: There was mild regurgitation.  - Pulmonary arteries: PA peak pressure: 34 mm Hg (S).  - Inferior vena cava: The vessel was normal in size. The respirophasic diameter changes were in the normal range (>= 50%), consistent with normal central venous pressure.  - Impressions: LVEF 60-65%, moderate LVH, normal wall motion, grade 2 DD with elevated LV filling pressure, aortic valve sclerosis with trivial AI, mild to moderate MR, mild LAE, mild TR, RVSP 34 mmHg, normal IVC.    Past Medical History:  Diagnosis Date  . ACS (acute coronary syndrome) ([place]) 04/11/2017  . ALLERGIC RHINITIS 08/23/2007   Qualifier: Diagnosis of  By: Doy Mince LPN, Megan    . Anxiety   . Arthritis   . Asthma   . ASTHMA 08/23/2007   Qualifier: Diagnosis of  By: Doy Mince LPN, Megan    . Bigeminy 04/10/2017  . 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 Cancer of right breast, stage 2 ([place]) 05/28/2011   2.8 cm invasive 1 node pos ER pos S/P mastectomy 06/1995 then AC x 4 then Tam X 5   . Chest pain, rule out acute myocardial infarction 04/10/2017  . Chronic constipation   . Chronic cystitis 04/09/2017  . Cough 08/23/2007   Qualifier: Diagnosis of  By: Gwenette Greet MD, Armando Reichert   . Depression   . 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 Dysrhythmia 2020   Had Afib  . 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 Fibromyalgia   . GERD (gastroesophageal reflux disease)   . History of adenomatous polyp of colon 09/30/2018  . 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 History of colon polyps    2007 hyperplagia  . History of esophageal dilatation    2008  . History of peptic ulcer   . History of small bowel  obstruction    2012  . Hypothyroid 05/28/2011  . Hypothyroidism   . Incomplete emptying of bladder 07/30/2016  . Left foot pain 12/11/2015   Overview:  Dr Cyril Mourning [facility]  . Macular degeneration of right eye   . Mild depression ([place]) 05/28/2011  . Mixed incontinence urge and stress 09/16/2011   S/p lumax desires surgery   . Morbid obesity ([place]) 04/10/2011  . Numbness of left foot    4 toes  . Osteoporosis 12/15/2016   Overview:  Dr Cletis Media  . Paget's disease of vulva   . Personal history of chemotherapy 1997  . Personal history of radiation therapy 2005  . PONV (postoperative nausea and vomiting)    and HARD TO WAKE  . Seasonal allergies   . Shortness of breath   . SKIN CANCER, HX OF 08/23/2007   Qualifier: Diagnosis of  By: Doy Mince LPN, Megan    . SUI (stress urinary incontinence, female)   .  Thrombocytopenic disorder ([place]) 02/25/2017  . Wears glasses     Past Surgical History:  Procedure Laterality Date  . BLADDER SUSPENSION  10/13/2011   Procedure: TRANSVAGINAL TAPE (TVT) PROCEDURE;  Surgeon: Delice Lesch, MD;  Location: [facility];  Service: Gynecology;  Laterality: N/A;  . BREAST BIOPSY Left 03/19/2004  . BREAST LUMPECTOMY Left 03-27-2004  . CARDIAC CATHETERIZATION  10-30-2000  dr Wynonia Lawman   normal coronary arteries and LVF/  ef 65-70%  . CLOSED MANIPULATION  W/ OPEN REDUCTION EXCHANGE TIBIAL FEMORAL BEARING POST TOTAL LEFT KNEE  04-14-2001  . CYSTOSCOPY  10/13/2011   Procedure: CYSTOSCOPY;  Surgeon: Delice Lesch, MD;  Location: [facility];  Service: Gynecology;  Laterality: Bilateral;  . DIAGNOSTIC LAPAROSCOPY     Colon d/t SBO  . DILATION AND CURETTAGE OF UTERUS  1968  . FOOT SURGERY Left 02-01-2009   TRIPLE ARTHRODESIS  . FOOT SURGERY Left 11/14   I&D left foot with woundvac placement  . INCISIONAL HERNIA REPAIR  05/05/2011   Procedure: LAPAROSCOPIC INCISIONAL HERNIA;  Surgeon: Edward Jolly, MD;  Location: [facility];  Service: General;  Laterality: N/A;  LAPAROSCOPIC  REPAIR INCARCERATED INCISIONAL HERNIA  WITH MESH  . KNEE ARTHROSCOPY Bilateral left [id number] & 04-1992/   right [phone]  . LAPAROSCOPIC CHOLECYSTECTOMY  06-21-2010  . LEFT HEART CATH AND CORONARY ANGIOGRAPHY N/A 04/13/2017   Procedure: LEFT HEART CATH AND CORONARY ANGIOGRAPHY;  Surgeon: Jettie Booze, MD;  Location: [facility];  Service: Cardiovascular;  Laterality: N/A;  . LUMBAR SPINE SURGERY  1998   L5 -- S1  . MASTECTOMY Right 1997   W/  LYMPH NODE DISSECTION AND RECONSTRUCTION WITH IMPLANTS AND EXPANDER  . NEGATIVE SLEEP STUDY  12/26/2015  . PORT-A-CATH PLACEMENT AND REMOVAL  1997  . REMOVAL RIGHT BREAST IMPLANT AND CAPSULECTOMY  06-03-1999  . REVISION TOTAL KNEE ARTHROPLASTY Left 07-07-2005  &  12-10-2001   12-10-2001  REMOVAL TOTAL KNEE/ I&D / INSERTION ANTIBIOTIC SPACER  . REVISION TOTAL KNEE ARTHROPLASTY Right 07-07-2005  . ROTATOR CUFF REPAIR Right   . TENDON REPAIR RIGHT RING FINGER  2011  . THUMB TRIGGER RELEASE Right 1993  . THYROIDECTOMY, PARTIAL  1983  . TONSILLECTOMY  1968  . TOTAL KNEE ARTHROPLASTY Bilateral right 05-04-2000/  left 03-29-2001  . VULVECTOMY  05/27/2012   Procedure: WIDE EXCISION VULVECTOMY;  Surgeon: Imagene Gurney A. Alycia Rossetti, MD;  Location: [facility];  Service: Gynecology;  Laterality: N/A;  Wide Local Excision of Vulva   . VULVECTOMY N/A 11/23/2012   Procedure: WIDE LOCAL EXCISION OF THE VULVA;  Surgeon: Imagene Gurney A. Alycia Rossetti, MD;  Location: [facility];  Service: Gynecology;  Laterality: N/A;  left inferior vulvar biopsy excision left superior lesion left inferior vulvar excision  . VULVECTOMY N/A 08/02/2013   Procedure: WIDE LOCAL  EXCISION VULVA;  Surgeon: Imagene Gurney A. Alycia Rossetti, MD;  Location: [facility];  Service: Gynecology;  Laterality: N/A;  . VULVECTOMY Left 03/29/2014   Procedure: WIDE LOCAL EXCISION OF LEFT VULVA ;  Surgeon: Imagene Gurney A. Alycia Rossetti, MD;  Location: [facility];  Service: Gynecology;  Laterality: Left;  Marland Kitchen VULVECTOMY Bilateral 01/24/2015    Procedure: WIDE LOCAL EXCISION VULVA;  Surgeon: [name], MD;  Location: [facility];  Service: Gynecology;  Laterality: Bilateral;  . VULVECTOMY PARTIAL  07-16-2010    MEDICATIONS: . acetaminophen (TYLENOL) 500 MG tablet  . albuterol (PROVENTIL HFA;VENTOLIN HFA) 108 (90 BASE) MCG/ACT inhaler  . aspirin EC 81 MG EC tablet  . atorvastatin (  LIPITOR) 40 MG tablet  . bisoprolol (ZEBETA) 5 MG tablet  . budesonide-formoterol (SYMBICORT) 160-4.5 MCG/ACT inhaler  . buPROPion (WELLBUTRIN XL) 150 MG 24 hr tablet  . Cholecalciferol (VITAMIN D3) 1000 UNITS CAPS  . citalopram (CELEXA) 40 MG tablet  . clindamycin (CLEOCIN) 150 MG capsule  . furosemide (LASIX) 40 MG tablet  . hydrOXYzine (VISTARIL) 25 MG capsule  . ibandronate (BONIVA) 150 MG tablet  . ipratropium-albuterol (DUONEB) 0.5-2.5 (3) MG/3ML SOLN  . ketoconazole (NIZORAL) 2 % shampoo  . levothyroxine (SYNTHROID) 200 MCG tablet  . meloxicam (MOBIC) 15 MG tablet  . mexiletine (MEXITIL) 150 MG capsule  . montelukast (SINGULAIR) 10 MG tablet  . Multiple Vitamin (MULTIVITAMIN WITH MINERALS) TABS tablet  . nystatin (MYCOSTATIN/NYSTOP) powder  . nystatin cream (MYCOSTATIN)  . Omega-3 1000 MG CAPS  . pantoprazole (PROTONIX) 40 MG tablet  . polyvinyl alcohol (LIQUIFILM TEARS) 1.4 % ophthalmic solution  . potassium chloride (K-DUR) 10 MEQ tablet  . traMADol (ULTRAM) 50 MG tablet   No current facility-administered medications for this encounter.   Derrill Memo ON 10/25/2019] vancomycin (VANCOREADY) IVPB 1500 mg/300 mL    If no changes, I anticipate pt can proceed with surgery as scheduled.   Willeen Cass, FNP-BC [facility] Short Stay Surgical Center/Anesthesiology Phone: [phone] 10/21/2019 3:41 PM

## 2019-10-21 NOTE — Progress Notes (Signed)
PCP - Dr. Linton Rump at [facility] [place] / [place] [facility] Cardiologist - Dr. Curt Bears on [facility] / Did see Dr. Idolina Primer with [facility] in [place]  Chest x-ray - N/A EKG - 10/18/19 Stress Test - 05/17/19 ECHO - 04/11/17 Cardiac Cath - 04/13/17  Sleep Study - Prior study no OSA  DM - Denies  Aspirin Instructions: On 81mg  ASA instructed to talk to surgeon today to get instruction on when to stop.  ERAS Protcol - Yes PRE-SURGERY Ensure or G2- Given  COVID TEST- 10/21/19  Anesthesia review: Yes h/o afib and cardiac cath  Patient denies shortness of breath, fever, cough and chest pain at PAT appointment   All instructions explained to the patient, with a verbal understanding of the material. Patient agrees to go over the instructions while at home for a better understanding. Patient also instructed to self quarantine after being tested for COVID-19. The opportunity to ask questions was provided.

## 2019-10-21 NOTE — Anesthesia Preprocedure Evaluation (Addendum)
Anesthesia Evaluation    Reviewed: Allergy & Precautions, Patient's Chart, lab work & pertinent test results, reviewed documented beta blocker date and time   History of Anesthesia Complications (+) PONV and history of anesthetic complications  Airway Mallampati: II  TM Distance: >3 FB Neck ROM: Full    Dental  (+) Missing,    Pulmonary asthma , former smoker,    Pulmonary exam normal        Cardiovascular hypertension, Pt. on medications and Pt. on home beta blockers Normal cardiovascular exam  TTE 2018: moderate LVH, EF 60-65%, grade 2 DD, mild tomoderate MR, mild LAE, mild TR    Frequent PVCs   Neuro/Psych Anxiety Depression negative neurological ROS     GI/Hepatic Neg liver ROS, GERD  Medicated and Controlled,  Endo/Other  Hypothyroidism Morbid obesity  Renal/GU negative Renal ROS  negative genitourinary   Musculoskeletal  (+) Arthritis , Fibromyalgia -  Abdominal   Peds  Hematology plts 134   Anesthesia Other Findings Hx breast ca  Reproductive/Obstetrics negative OB ROS                           Anesthesia Physical Anesthesia Plan  ASA: III  Anesthesia Plan: General   Post-op Pain Management: GA combined w/ Regional for post-op pain   Induction: Intravenous  PONV Risk Score and Plan: 4 or greater and Midazolam, Ondansetron, Dexamethasone and Treatment may vary due to age or medical condition  Airway Management Planned: Oral ETT  Additional Equipment: None  Intra-op Plan:   Post-operative Plan: Extubation in OR  Informed Consent: I have reviewed the patients History and Physical, chart, labs and discussed the procedure including the risks, benefits and alternatives for the proposed anesthesia with the patient or authorized representative who has indicated his/her understanding and acceptance.     Dental advisory given  Plan Discussed with: CRNA  Anesthesia Plan  Comments: (See APP note by Durel Salts, FNP)      Anesthesia Quick Evaluation

## 2019-10-23 LAB — URINE CULTURE: Culture: NO GROWTH

## 2019-10-25 ENCOUNTER — Other Ambulatory Visit: Payer: Self-pay

## 2019-10-25 ENCOUNTER — Encounter (HOSPITAL_COMMUNITY)
Admission: RE | Disposition: A | Discharge status: Home / Self Care | Payer: Self-pay | Source: Ambulatory Visit | Attending: Orthopedic Surgery

## 2019-10-25 ENCOUNTER — Encounter (HOSPITAL_COMMUNITY): Payer: Self-pay | Admitting: Orthopedic Surgery

## 2019-10-25 ENCOUNTER — Ambulatory Visit (HOSPITAL_COMMUNITY): Payer: Medicare HMO | Admitting: Emergency Medicine

## 2019-10-25 ENCOUNTER — Observation Stay (HOSPITAL_COMMUNITY)
Admission: RE | Admit: 2019-10-25 | Discharge: 2019-10-28 | Disposition: A | Discharge status: Home / Self Care | Payer: Medicare HMO | Source: Ambulatory Visit | Attending: Orthopedic Surgery | Admitting: Orthopedic Surgery

## 2019-10-25 ENCOUNTER — Ambulatory Visit (HOSPITAL_COMMUNITY): Payer: Medicare HMO | Admitting: Anesthesiology

## 2019-10-25 ENCOUNTER — Ambulatory Visit (HOSPITAL_COMMUNITY): Payer: Medicare HMO

## 2019-10-25 ENCOUNTER — Institutional Professional Consult (permissible substitution): Payer: Medicare HMO | Admitting: Cardiology

## 2019-10-25 DIAGNOSIS — M797 Fibromyalgia: Secondary | ICD-10-CM | POA: Diagnosis not present

## 2019-10-25 DIAGNOSIS — Z6841 Body Mass Index (BMI) 40.0 and over, adult: Secondary | ICD-10-CM | POA: Diagnosis not present

## 2019-10-25 DIAGNOSIS — Z8711 Personal history of peptic ulcer disease: Secondary | ICD-10-CM | POA: Insufficient documentation

## 2019-10-25 DIAGNOSIS — E039 Hypothyroidism, unspecified: Secondary | ICD-10-CM | POA: Insufficient documentation

## 2019-10-25 DIAGNOSIS — Z853 Personal history of malignant neoplasm of breast: Secondary | ICD-10-CM | POA: Diagnosis not present

## 2019-10-25 DIAGNOSIS — M19012 Primary osteoarthritis, left shoulder: Principal | ICD-10-CM | POA: Insufficient documentation

## 2019-10-25 DIAGNOSIS — Z85828 Personal history of other malignant neoplasm of skin: Secondary | ICD-10-CM | POA: Diagnosis not present

## 2019-10-25 DIAGNOSIS — J45909 Unspecified asthma, uncomplicated: Secondary | ICD-10-CM | POA: Insufficient documentation

## 2019-10-25 DIAGNOSIS — Z885 Allergy status to narcotic agent status: Secondary | ICD-10-CM | POA: Insufficient documentation

## 2019-10-25 DIAGNOSIS — Z880 Allergy status to penicillin: Secondary | ICD-10-CM | POA: Insufficient documentation

## 2019-10-25 DIAGNOSIS — F419 Anxiety disorder, unspecified: Secondary | ICD-10-CM | POA: Insufficient documentation

## 2019-10-25 DIAGNOSIS — Z9221 Personal history of antineoplastic chemotherapy: Secondary | ICD-10-CM | POA: Insufficient documentation

## 2019-10-25 DIAGNOSIS — F329 Major depressive disorder, single episode, unspecified: Secondary | ICD-10-CM | POA: Insufficient documentation

## 2019-10-25 DIAGNOSIS — Z87891 Personal history of nicotine dependence: Secondary | ICD-10-CM | POA: Diagnosis not present

## 2019-10-25 DIAGNOSIS — Z7982 Long term (current) use of aspirin: Secondary | ICD-10-CM | POA: Insufficient documentation

## 2019-10-25 DIAGNOSIS — Z79890 Hormone replacement therapy (postmenopausal): Secondary | ICD-10-CM | POA: Insufficient documentation

## 2019-10-25 DIAGNOSIS — K219 Gastro-esophageal reflux disease without esophagitis: Secondary | ICD-10-CM | POA: Diagnosis not present

## 2019-10-25 DIAGNOSIS — Z881 Allergy status to other antibiotic agents status: Secondary | ICD-10-CM | POA: Diagnosis not present

## 2019-10-25 DIAGNOSIS — M810 Age-related osteoporosis without current pathological fracture: Secondary | ICD-10-CM | POA: Insufficient documentation

## 2019-10-25 DIAGNOSIS — Z96612 Presence of left artificial shoulder joint: Secondary | ICD-10-CM

## 2019-10-25 DIAGNOSIS — M479 Spondylosis, unspecified: Secondary | ICD-10-CM | POA: Insufficient documentation

## 2019-10-25 DIAGNOSIS — Z9889 Other specified postprocedural states: Secondary | ICD-10-CM

## 2019-10-25 DIAGNOSIS — Z888 Allergy status to other drugs, medicaments and biological substances status: Secondary | ICD-10-CM | POA: Diagnosis not present

## 2019-10-25 DIAGNOSIS — Z923 Personal history of irradiation: Secondary | ICD-10-CM | POA: Diagnosis not present

## 2019-10-25 DIAGNOSIS — Z96653 Presence of artificial knee joint, bilateral: Secondary | ICD-10-CM | POA: Diagnosis not present

## 2019-10-25 DIAGNOSIS — Z79899 Other long term (current) drug therapy: Secondary | ICD-10-CM | POA: Insufficient documentation

## 2019-10-25 HISTORY — PX: REVERSE SHOULDER ARTHROPLASTY: SHX5054

## 2019-10-25 SURGERY — ARTHROPLASTY, SHOULDER, TOTAL, REVERSE
Anesthesia: General | Site: Shoulder | Laterality: Left

## 2019-10-25 MED ORDER — MIDAZOLAM HCL 2 MG/2ML IJ SOLN
INTRAMUSCULAR | Status: DC | PRN
  Administered 2019-10-25: 1 mg via INTRAVENOUS

## 2019-10-25 MED ORDER — VANCOMYCIN HCL 1000 MG IV SOLR
INTRAVENOUS | Status: DC | PRN
  Administered 2019-10-25: 1000 mg

## 2019-10-25 MED ORDER — PROPOFOL 10 MG/ML IV BOLUS
INTRAVENOUS | Status: DC | PRN
  Administered 2019-10-25: 180 mg via INTRAVENOUS

## 2019-10-25 MED ORDER — PHENYLEPHRINE 40 MCG/ML (10ML) SYRINGE FOR IV PUSH (FOR BLOOD PRESSURE SUPPORT)
PREFILLED_SYRINGE | INTRAVENOUS | Status: DC | PRN
  Administered 2019-10-25: 80 ug via INTRAVENOUS
  Administered 2019-10-25: 40 ug via INTRAVENOUS
  Administered 2019-10-25 (×4): 80 ug via INTRAVENOUS

## 2019-10-25 MED ORDER — MENTHOL 3 MG MT LOZG: 1.0000 | LOZENGE | OROMUCOSAL | Status: DC | PRN

## 2019-10-25 MED ORDER — PHENYLEPHRINE 40 MCG/ML (10ML) SYRINGE FOR IV PUSH (FOR BLOOD PRESSURE SUPPORT)
PREFILLED_SYRINGE | INTRAVENOUS | Status: AC
  Filled 2019-10-25: qty 10

## 2019-10-25 MED ORDER — METOCLOPRAMIDE HCL 5 MG/ML IJ SOLN: 5.0000 mg | Freq: Three times a day (TID) | INTRAMUSCULAR | Status: DC | PRN

## 2019-10-25 MED ORDER — ONDANSETRON HCL 4 MG/2ML IJ SOLN
INTRAMUSCULAR | Status: AC
  Filled 2019-10-25: qty 2

## 2019-10-25 MED ORDER — OXYCODONE HCL 5 MG/5ML PO SOLN: 5.0000 mg | Freq: Once | ORAL | Status: DC | PRN

## 2019-10-25 MED ORDER — DEXAMETHASONE SODIUM PHOSPHATE 10 MG/ML IJ SOLN
INTRAMUSCULAR | Status: DC | PRN
  Administered 2019-10-25: 10 mg via INTRAVENOUS

## 2019-10-25 MED ORDER — FUROSEMIDE 40 MG PO TABS
40.0000 mg | ORAL_TABLET | Freq: Every day | ORAL | Status: DC
  Administered 2019-10-26 – 2019-10-28 (×3): 40 mg via ORAL
  Filled 2019-10-25 (×3): qty 1

## 2019-10-25 MED ORDER — OXYCODONE HCL 5 MG PO TABS
5.0000 mg | ORAL_TABLET | ORAL | Status: DC | PRN
  Administered 2019-10-25: 5 mg via ORAL
  Administered 2019-10-26 – 2019-10-27 (×2): 10 mg via ORAL
  Filled 2019-10-25: qty 2
  Filled 2019-10-25 (×2): qty 1
  Filled 2019-10-25: qty 2

## 2019-10-25 MED ORDER — LIDOCAINE 2% (20 MG/ML) 5 ML SYRINGE
INTRAMUSCULAR | Status: AC
  Filled 2019-10-25: qty 5

## 2019-10-25 MED ORDER — PROPOFOL 10 MG/ML IV BOLUS
INTRAVENOUS | Status: AC
  Filled 2019-10-25: qty 20

## 2019-10-25 MED ORDER — ROCURONIUM BROMIDE 10 MG/ML (PF) SYRINGE
PREFILLED_SYRINGE | INTRAVENOUS | Status: AC
  Filled 2019-10-25: qty 10

## 2019-10-25 MED ORDER — LACTATED RINGERS IV SOLN: INTRAVENOUS | Status: DC

## 2019-10-25 MED ORDER — ONDANSETRON HCL 4 MG PO TABS: 4.0000 mg | ORAL_TABLET | Freq: Four times a day (QID) | ORAL | Status: DC | PRN

## 2019-10-25 MED ORDER — GLYCOPYRROLATE PF 0.2 MG/ML IJ SOSY
PREFILLED_SYRINGE | INTRAMUSCULAR | Status: DC | PRN
  Administered 2019-10-25 (×2): .2 mg via INTRAVENOUS

## 2019-10-25 MED ORDER — TRANEXAMIC ACID-NACL 1000-0.7 MG/100ML-% IV SOLN
1000.0000 mg | INTRAVENOUS | Status: AC
  Administered 2019-10-25: 1000 mg via INTRAVENOUS
  Filled 2019-10-25: qty 100

## 2019-10-25 MED ORDER — POVIDONE-IODINE 7.5 % EX SOLN
Freq: Once | CUTANEOUS | Status: DC
  Filled 2019-10-25: qty 118

## 2019-10-25 MED ORDER — EPHEDRINE 5 MG/ML INJ
INTRAVENOUS | Status: AC
  Filled 2019-10-25: qty 10

## 2019-10-25 MED ORDER — NEOSTIGMINE METHYLSULFATE 3 MG/3ML IV SOSY
PREFILLED_SYRINGE | INTRAVENOUS | Status: AC
  Filled 2019-10-25: qty 3

## 2019-10-25 MED ORDER — ATORVASTATIN CALCIUM 40 MG PO TABS
40.0000 mg | ORAL_TABLET | Freq: Every day | ORAL | Status: DC
  Administered 2019-10-25 – 2019-10-27 (×3): 40 mg via ORAL
  Filled 2019-10-25 (×3): qty 1

## 2019-10-25 MED ORDER — 0.9 % SODIUM CHLORIDE (POUR BTL) OPTIME
TOPICAL | Status: DC | PRN
  Administered 2019-10-25: 3000 mL
  Administered 2019-10-25: 1000 mL

## 2019-10-25 MED ORDER — METHOCARBAMOL 1000 MG/10ML IJ SOLN
500.0000 mg | Freq: Four times a day (QID) | INTRAVENOUS | Status: DC | PRN
  Filled 2019-10-25: qty 5

## 2019-10-25 MED ORDER — GLYCOPYRROLATE PF 0.2 MG/ML IJ SOSY
PREFILLED_SYRINGE | INTRAMUSCULAR | Status: AC
  Filled 2019-10-25: qty 1

## 2019-10-25 MED ORDER — CITALOPRAM HYDROBROMIDE 40 MG PO TABS
40.0000 mg | ORAL_TABLET | Freq: Every morning | ORAL | Status: DC
  Administered 2019-10-26 – 2019-10-28 (×3): 40 mg via ORAL
  Filled 2019-10-25: qty 1
  Filled 2019-10-25 (×2): qty 2
  Filled 2019-10-25: qty 1
  Filled 2019-10-25: qty 2
  Filled 2019-10-25: qty 1

## 2019-10-25 MED ORDER — ONDANSETRON HCL 4 MG/2ML IJ SOLN
4.0000 mg | Freq: Four times a day (QID) | INTRAMUSCULAR | Status: DC | PRN
  Administered 2019-10-25: 4 mg via INTRAVENOUS
  Filled 2019-10-25: qty 2

## 2019-10-25 MED ORDER — IPRATROPIUM-ALBUTEROL 0.5-2.5 (3) MG/3ML IN SOLN: 3.0000 mL | RESPIRATORY_TRACT | Status: DC | PRN

## 2019-10-25 MED ORDER — NEOSTIGMINE METHYLSULFATE 10 MG/10ML IV SOLN
INTRAVENOUS | Status: DC | PRN
  Administered 2019-10-25: 2 mg via INTRAVENOUS

## 2019-10-25 MED ORDER — PHENYLEPHRINE HCL-NACL 10-0.9 MG/250ML-% IV SOLN
INTRAVENOUS | Status: DC | PRN
  Administered 2019-10-25: 50 ug/min via INTRAVENOUS

## 2019-10-25 MED ORDER — VANCOMYCIN HCL 1000 MG IV SOLR
INTRAVENOUS | Status: AC
  Filled 2019-10-25: qty 1000

## 2019-10-25 MED ORDER — POVIDONE-IODINE 10 % EX SWAB: 2.0000 "application " | Freq: Once | CUTANEOUS | Status: DC

## 2019-10-25 MED ORDER — CELECOXIB 200 MG PO CAPS
200.0000 mg | ORAL_CAPSULE | Freq: Two times a day (BID) | ORAL | Status: DC
  Administered 2019-10-25 – 2019-10-28 (×7): 200 mg via ORAL
  Filled 2019-10-25 (×8): qty 1

## 2019-10-25 MED ORDER — LIDOCAINE 2% (20 MG/ML) 5 ML SYRINGE
INTRAMUSCULAR | Status: DC | PRN
  Administered 2019-10-25: 100 mg via INTRAVENOUS

## 2019-10-25 MED ORDER — PANTOPRAZOLE SODIUM 40 MG PO TBEC
40.0000 mg | DELAYED_RELEASE_TABLET | Freq: Every day | ORAL | Status: DC
  Administered 2019-10-26 – 2019-10-28 (×3): 40 mg via ORAL
  Filled 2019-10-25 (×3): qty 1

## 2019-10-25 MED ORDER — PROMETHAZINE HCL 25 MG/ML IJ SOLN: 6.2500 mg | INTRAMUSCULAR | Status: DC | PRN

## 2019-10-25 MED ORDER — DOCUSATE SODIUM 100 MG PO CAPS
100.0000 mg | ORAL_CAPSULE | Freq: Two times a day (BID) | ORAL | Status: DC
  Administered 2019-10-25 – 2019-10-28 (×6): 100 mg via ORAL
  Filled 2019-10-25 (×6): qty 1

## 2019-10-25 MED ORDER — ROCURONIUM BROMIDE 10 MG/ML (PF) SYRINGE
PREFILLED_SYRINGE | INTRAVENOUS | Status: DC | PRN
  Administered 2019-10-25: 30 mg via INTRAVENOUS

## 2019-10-25 MED ORDER — PHENOL 1.4 % MT LIQD: 1.0000 | OROMUCOSAL | Status: DC | PRN

## 2019-10-25 MED ORDER — ASPIRIN EC 81 MG PO TBEC
81.0000 mg | DELAYED_RELEASE_TABLET | Freq: Every day | ORAL | Status: DC
  Administered 2019-10-26 – 2019-10-28 (×3): 81 mg via ORAL
  Filled 2019-10-25 (×4): qty 1

## 2019-10-25 MED ORDER — MIDAZOLAM HCL 2 MG/2ML IJ SOLN
INTRAMUSCULAR | Status: AC
  Filled 2019-10-25: qty 2

## 2019-10-25 MED ORDER — MEXILETINE HCL 150 MG PO CAPS
150.0000 mg | ORAL_CAPSULE | Freq: Three times a day (TID) | ORAL | Status: DC
  Administered 2019-10-25 – 2019-10-28 (×8): 150 mg via ORAL
  Filled 2019-10-25 (×12): qty 1

## 2019-10-25 MED ORDER — ALBUTEROL SULFATE HFA 108 (90 BASE) MCG/ACT IN AERS: 2.0000 | INHALATION_SPRAY | RESPIRATORY_TRACT | Status: DC | PRN

## 2019-10-25 MED ORDER — FENTANYL CITRATE (PF) 100 MCG/2ML IJ SOLN: 25.0000 ug | INTRAMUSCULAR | Status: DC | PRN

## 2019-10-25 MED ORDER — ACETAMINOPHEN 500 MG PO TABS
1000.0000 mg | ORAL_TABLET | Freq: Once | ORAL | Status: AC
  Administered 2019-10-25: 1000 mg via ORAL
  Filled 2019-10-25: qty 2

## 2019-10-25 MED ORDER — BISOPROLOL FUMARATE 5 MG PO TABS
5.0000 mg | ORAL_TABLET | Freq: Every day | ORAL | Status: DC
  Administered 2019-10-27 – 2019-10-28 (×2): 5 mg via ORAL
  Filled 2019-10-25 (×4): qty 1

## 2019-10-25 MED ORDER — GABAPENTIN 300 MG PO CAPS
300.0000 mg | ORAL_CAPSULE | Freq: Three times a day (TID) | ORAL | Status: DC
  Administered 2019-10-25 – 2019-10-28 (×9): 300 mg via ORAL
  Filled 2019-10-25 (×9): qty 1

## 2019-10-25 MED ORDER — BUPIVACAINE LIPOSOME 1.3 % IJ SUSP
INTRAMUSCULAR | Status: DC | PRN
  Administered 2019-10-25: 10 mL via PERINEURAL

## 2019-10-25 MED ORDER — MONTELUKAST SODIUM 10 MG PO TABS
10.0000 mg | ORAL_TABLET | Freq: Every day | ORAL | Status: DC
  Administered 2019-10-25 – 2019-10-27 (×3): 10 mg via ORAL
  Filled 2019-10-25 (×4): qty 1

## 2019-10-25 MED ORDER — FENTANYL CITRATE (PF) 250 MCG/5ML IJ SOLN
INTRAMUSCULAR | Status: AC
  Filled 2019-10-25: qty 5

## 2019-10-25 MED ORDER — EPHEDRINE SULFATE-NACL 50-0.9 MG/10ML-% IV SOSY
PREFILLED_SYRINGE | INTRAVENOUS | Status: DC | PRN
  Administered 2019-10-25 (×7): 10 mg via INTRAVENOUS

## 2019-10-25 MED ORDER — BUPROPION HCL ER (XL) 150 MG PO TB24
150.0000 mg | ORAL_TABLET | Freq: Every morning | ORAL | Status: DC
  Administered 2019-10-26 – 2019-10-28 (×3): 150 mg via ORAL
  Filled 2019-10-25 (×3): qty 1

## 2019-10-25 MED ORDER — FENTANYL CITRATE (PF) 250 MCG/5ML IJ SOLN
INTRAMUSCULAR | Status: DC | PRN
  Administered 2019-10-25 (×2): 50 ug via INTRAVENOUS

## 2019-10-25 MED ORDER — ONDANSETRON HCL 4 MG/2ML IJ SOLN
INTRAMUSCULAR | Status: DC | PRN
  Administered 2019-10-25: 4 mg via INTRAVENOUS

## 2019-10-25 MED ORDER — DEXAMETHASONE SODIUM PHOSPHATE 10 MG/ML IJ SOLN
INTRAMUSCULAR | Status: AC
  Filled 2019-10-25: qty 1

## 2019-10-25 MED ORDER — LEVOTHYROXINE SODIUM 200 MCG PO TABS
200.0000 ug | ORAL_TABLET | Freq: Every day | ORAL | Status: DC
  Administered 2019-10-26 – 2019-10-28 (×3): 200 ug via ORAL
  Filled 2019-10-25: qty 2
  Filled 2019-10-25 (×2): qty 1
  Filled 2019-10-25 (×2): qty 2
  Filled 2019-10-25: qty 1

## 2019-10-25 MED ORDER — CHLORHEXIDINE GLUCONATE 0.12 % MT SOLN
15.0000 mL | Freq: Once | OROMUCOSAL | Status: AC
  Administered 2019-10-25: 15 mL via OROMUCOSAL
  Filled 2019-10-25: qty 15

## 2019-10-25 MED ORDER — IRRISEPT - 450ML BOTTLE WITH 0.05% CHG IN STERILE WATER, USP 99.95% OPTIME
TOPICAL | Status: DC | PRN
  Administered 2019-10-25: 450 mL

## 2019-10-25 MED ORDER — METHOCARBAMOL 500 MG PO TABS
500.0000 mg | ORAL_TABLET | Freq: Four times a day (QID) | ORAL | Status: DC | PRN
  Administered 2019-10-28: 500 mg via ORAL
  Filled 2019-10-25: qty 1

## 2019-10-25 MED ORDER — ACETAMINOPHEN 500 MG PO TABS
1000.0000 mg | ORAL_TABLET | Freq: Four times a day (QID) | ORAL | Status: AC
  Administered 2019-10-25 – 2019-10-26 (×3): 1000 mg via ORAL
  Filled 2019-10-25 (×3): qty 2

## 2019-10-25 MED ORDER — METOCLOPRAMIDE HCL 5 MG PO TABS: 5.0000 mg | ORAL_TABLET | Freq: Three times a day (TID) | ORAL | Status: DC | PRN

## 2019-10-25 MED ORDER — SUCCINYLCHOLINE CHLORIDE 200 MG/10ML IV SOSY
PREFILLED_SYRINGE | INTRAVENOUS | Status: DC | PRN
  Administered 2019-10-25: 180 mg via INTRAVENOUS

## 2019-10-25 MED ORDER — MOMETASONE FURO-FORMOTEROL FUM 200-5 MCG/ACT IN AERO
2.0000 | INHALATION_SPRAY | Freq: Two times a day (BID) | RESPIRATORY_TRACT | Status: DC
  Administered 2019-10-25 – 2019-10-27 (×5): 2 via RESPIRATORY_TRACT
  Filled 2019-10-25: qty 8.8

## 2019-10-25 MED ORDER — ALBUMIN HUMAN 5 % IV SOLN
INTRAVENOUS | Status: DC | PRN
Start: 2019-10-25 — End: 2019-10-25

## 2019-10-25 MED ORDER — BUPIVACAINE-EPINEPHRINE (PF) 0.5% -1:200000 IJ SOLN
INTRAMUSCULAR | Status: DC | PRN
Start: 2019-10-25 — End: 2019-10-25
  Administered 2019-10-25: 15 mL via PERINEURAL

## 2019-10-25 MED ORDER — OXYCODONE HCL 5 MG PO TABS: 5.0000 mg | ORAL_TABLET | Freq: Once | ORAL | Status: DC | PRN

## 2019-10-25 MED ORDER — SUCCINYLCHOLINE CHLORIDE 200 MG/10ML IV SOSY
PREFILLED_SYRINGE | INTRAVENOUS | Status: AC
  Filled 2019-10-25: qty 10

## 2019-10-25 MED ORDER — ORAL CARE MOUTH RINSE: 15.0000 mL | Freq: Once | OROMUCOSAL | Status: AC

## 2019-10-25 MED ORDER — VANCOMYCIN HCL IN DEXTROSE 1-5 GM/200ML-% IV SOLN
1000.0000 mg | Freq: Two times a day (BID) | INTRAVENOUS | Status: AC
  Administered 2019-10-25: 1000 mg via INTRAVENOUS
  Filled 2019-10-25: qty 200

## 2019-10-25 SURGICAL SUPPLY — 91 items
AID PSTN UNV HD RSTRNT DISP (MISCELLANEOUS) ×1
ALCOHOL 70% 16 OZ (MISCELLANEOUS) ×2 IMPLANT
APL PRP STRL LF DISP 70% ISPRP (MISCELLANEOUS) ×1
AUG COMP REV MI TAPER ADAPTER (Joint) ×2 IMPLANT
AUGMENT COMP REV MI TAPR ADPTR (Joint) IMPLANT
BEARING HUMERAL SHLDER 36M STD (Shoulder) IMPLANT
BIT DRILL 2.7 W/STOP DISP (BIT) ×1 IMPLANT
BIT DRILL F/CENTRAL SCRW 3.2 (BIT) ×1
BIT DRILL F/CENTRAL SCRW 3.2MM (BIT) IMPLANT
BIT DRILL QUICK REL 1/8 2PK SL (DRILL) IMPLANT
BIT DRILL TWIST 2.7 (BIT) ×1 IMPLANT
BLADE SAW SGTL 13X75X1.27 (BLADE) ×2 IMPLANT
BRNG HUM STD 36 RVRS SHLDR (Shoulder) ×1 IMPLANT
BSPLAT GLND SM AUG TPR ADPR (Joint) ×1 IMPLANT
CHLORAPREP W/TINT 26 (MISCELLANEOUS) ×2 IMPLANT
CLSR STERI-STRIP ANTIMIC 1/2X4 (GAUZE/BANDAGES/DRESSINGS) ×2 IMPLANT
COVER SURGICAL LIGHT HANDLE (MISCELLANEOUS) ×2 IMPLANT
COVER WAND RF STERILE (DRAPES) ×2 IMPLANT
DRAPE INCISE IOBAN 66X45 STRL (DRAPES) ×2 IMPLANT
DRAPE U-SHAPE 47X51 STRL (DRAPES) ×4 IMPLANT
DRILL BIT F/CENTRAL SCRW 3.2MM (BIT) ×2
DRILL QUICK RELEASE 1/8 INCH (DRILL) ×2
DRSG AQUACEL AG ADV 3.5X10 (GAUZE/BANDAGES/DRESSINGS) ×2 IMPLANT
ELECT BLADE 4.0 EZ CLEAN MEGAD (MISCELLANEOUS) ×2
ELECT BLADE 6.5 EXT (BLADE) ×1 IMPLANT
ELECT REM PT RETURN 9FT ADLT (ELECTROSURGICAL) ×2
ELECTRODE BLDE 4.0 EZ CLN MEGD (MISCELLANEOUS) ×1 IMPLANT
ELECTRODE REM PT RTRN 9FT ADLT (ELECTROSURGICAL) ×1 IMPLANT
GAUZE SPONGE 4X4 12PLY STRL LF (GAUZE/BANDAGES/DRESSINGS) ×2 IMPLANT
GLENOID SPHERE STD STRL 36MM (Orthopedic Implant) ×1 IMPLANT
GLOVE BIOGEL PI IND STRL 7.0 (GLOVE) ×1 IMPLANT
GLOVE BIOGEL PI IND STRL 8 (GLOVE) ×1 IMPLANT
GLOVE BIOGEL PI INDICATOR 7.0 (GLOVE) ×1
GLOVE BIOGEL PI INDICATOR 8 (GLOVE) ×1
GLOVE ECLIPSE 7.0 STRL STRAW (GLOVE) ×2 IMPLANT
GLOVE ECLIPSE 8.0 STRL XLNG CF (GLOVE) ×2 IMPLANT
GOWN STRL REUS W/ TWL LRG LVL3 (GOWN DISPOSABLE) ×2 IMPLANT
GOWN STRL REUS W/ TWL XL LVL3 (GOWN DISPOSABLE) IMPLANT
GOWN STRL REUS W/TWL LRG LVL3 (GOWN DISPOSABLE) ×4
GOWN STRL REUS W/TWL XL LVL3 (GOWN DISPOSABLE)
GUIDE MODEL REV SHLD LT (ORTHOPEDIC DISPOSABLE SUPPLIES) ×1 IMPLANT
HYDROGEN PEROXIDE 16OZ (MISCELLANEOUS) ×2 IMPLANT
JET LAVAGE IRRISEPT WOUND (IRRIGATION / IRRIGATOR) ×2
KIT BASIN OR (CUSTOM PROCEDURE TRAY) ×2 IMPLANT
KIT TURNOVER KIT B (KITS) ×2 IMPLANT
LAVAGE JET IRRISEPT WOUND (IRRIGATION / IRRIGATOR) ×1 IMPLANT
LOOP VESSEL MAXI BLUE (MISCELLANEOUS) ×2 IMPLANT
MANIFOLD NEPTUNE II (INSTRUMENTS) ×2 IMPLANT
NDL SUT 6 .5 CRC .975X.05 MAYO (NEEDLE) IMPLANT
NDL TAPERED W/ NITINOL LOOP (MISCELLANEOUS) ×1 IMPLANT
NEEDLE MAYO TAPER (NEEDLE)
NEEDLE TAPERED W/ NITINOL LOOP (MISCELLANEOUS) ×2 IMPLANT
NS IRRIG 1000ML POUR BTL (IV SOLUTION) ×5 IMPLANT
PACK SHOULDER (CUSTOM PROCEDURE TRAY) ×2 IMPLANT
PAD ARMBOARD 7.5X6 YLW CONV (MISCELLANEOUS) ×4 IMPLANT
PASSER SUT SWANSON 36MM LOOP (INSTRUMENTS) ×2 IMPLANT
PIN THREADED REVERSE (PIN) ×1 IMPLANT
REAMER GUIDE BUSHING SURG DISP (MISCELLANEOUS) ×1 IMPLANT
REAMER GUIDE W/SCREW AUG (MISCELLANEOUS) ×1 IMPLANT
RESTRAINT HEAD UNIVERSAL NS (MISCELLANEOUS) ×2 IMPLANT
SCREW BONE LOCKING 4.75X35X3.5 (Screw) ×1 IMPLANT
SCREW BONE STRL 6.5MMX30MM (Screw) ×1 IMPLANT
SCREW LOCKING 4.75MMX15MM (Screw) ×2 IMPLANT
SCREW LOCKING STRL 4.75X25X3.5 (Screw) ×1 IMPLANT
SHOULDER HUMERAL BEAR 36M STD (Shoulder) ×2 IMPLANT
SLING ARM FOAM STRAP LRG (SOFTGOODS) ×1 IMPLANT
SLING ARM IMMOBILIZER LRG (SOFTGOODS) ×2 IMPLANT
SOL PREP POV-IOD 4OZ 10% (MISCELLANEOUS) ×2 IMPLANT
SPONGE LAP 18X18 RF (DISPOSABLE) ×3 IMPLANT
STEM HUMERAL STRL 8MMX83MM (Stem) ×1 IMPLANT
STRIP CLOSURE SKIN 1/2X4 (GAUZE/BANDAGES/DRESSINGS) ×2 IMPLANT
SUCTION FRAZIER HANDLE 10FR (MISCELLANEOUS) ×2
SUCTION TUBE FRAZIER 10FR DISP (MISCELLANEOUS) ×1 IMPLANT
SUT BROADBAND TAPE 2PK 1.5 (SUTURE) ×2 IMPLANT
SUT FIBERWIRE #2 38 T-5 BLUE (SUTURE)
SUT MAXBRAID (SUTURE) IMPLANT
SUT MNCRL AB 3-0 PS2 18 (SUTURE) ×2 IMPLANT
SUT SILK 2 0 TIES 10X30 (SUTURE) ×2 IMPLANT
SUT VIC AB 0 CT1 27 (SUTURE) ×8
SUT VIC AB 0 CT1 27XBRD ANBCTR (SUTURE) ×4 IMPLANT
SUT VIC AB 1 CT1 27 (SUTURE) ×4
SUT VIC AB 1 CT1 27XBRD ANBCTR (SUTURE) ×2 IMPLANT
SUT VIC AB 2-0 CT1 27 (SUTURE) ×6
SUT VIC AB 2-0 CT1 TAPERPNT 27 (SUTURE) ×3 IMPLANT
SUT VICRYL 0 UR6 27IN ABS (SUTURE) ×5 IMPLANT
SUTURE FIBERWR #2 38 T-5 BLUE (SUTURE) IMPLANT
TOWEL GREEN STERILE (TOWEL DISPOSABLE) ×2 IMPLANT
TRAY FOL W/BAG SLVR 16FR STRL (SET/KITS/TRAYS/PACK) IMPLANT
TRAY FOLEY W/BAG SLVR 16FR LF (SET/KITS/TRAYS/PACK)
TRAY HUM REV SHOULDER STD +6 (Shoulder) ×1 IMPLANT
WATER STERILE IRR 1000ML POUR (IV SOLUTION) ×2 IMPLANT

## 2019-10-25 NOTE — Plan of Care (Signed)
  Problem: Education: Goal: Knowledge of General Education information will improve Description: Including pain rating scale, medication(s)/side effects and non-pharmacologic comfort measures Outcome: Progressing   Problem: Nutrition: Goal: Adequate nutrition will be maintained Outcome: Progressing   Problem: Coping: Goal: Level of anxiety will decrease Outcome: Progressing   Problem: Elimination: Goal: Will not experience complications related to urinary retention Outcome: Progressing   Problem: Pain Managment: Goal: General experience of comfort will improve Outcome: Progressing Note: Treated once for a headache with oxycodone, left arm still numb from block   Problem: Safety: Goal: Ability to remain free from injury will improve Outcome: Progressing

## 2019-10-25 NOTE — Anesthesia Procedure Notes (Signed)
Procedure Name: Intubation Date/Time: 10/25/2019 7:37 AM Performed by: Michele Rockers, CRNA Pre-anesthesia Checklist: Patient identified, Emergency Drugs available, Suction available and Patient being monitored Patient Re-evaluated:Patient Re-evaluated prior to induction Oxygen Delivery Method: Circle system utilized Preoxygenation: Pre-oxygenation with 100% oxygen Induction Type: IV induction Ventilation: Mask ventilation without difficulty Laryngoscope Size: Miller and 3 Grade View: Grade I Tube type: Oral Tube size: 7.5 mm Number of attempts: 1 Airway Equipment and Method: Stylet and Oral airway Placement Confirmation: ETT inserted through vocal cords under direct vision,  positive ETCO2 and breath sounds checked- equal and bilateral Secured at: 22 cm Tube secured with: Tape Dental Injury: Teeth and Oropharynx as per pre-operative assessment

## 2019-10-25 NOTE — H&P (Signed)
[name] is an 74 y.o. female.   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  She describes rest pain and night pain and pain which is debilitating and affecting every aspect of her life.  She presents now for operative management.  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  This does put her at higher risk of infection in the shoulder.  She has undergone benzyl peroxide pretreatment for the past 3 days in the left shoulder region.  Past Medical History:  Diagnosis Date  . ACS (acute coronary syndrome) ([place]) 04/11/2017  . ALLERGIC RHINITIS 08/23/2007   Qualifier: Diagnosis of  By: Doy Mince LPN, Megan    . Anxiety   . Arthritis   . Asthma   . ASTHMA 08/23/2007   Qualifier: Diagnosis of  By: Doy Mince LPN, Megan    . Bigeminy 04/10/2017  . 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 Cancer of right breast, stage 2 ([place]) 05/28/2011   2.8 cm invasive 1 node pos ER pos S/P mastectomy 06/1995 then AC x 4 then Tam X 5   . Chest pain, rule out acute myocardial infarction 04/10/2017  . Chronic constipation   . Chronic cystitis 04/09/2017  . Cough 08/23/2007   Qualifier: Diagnosis of  By: Gwenette Greet MD, Armando Reichert   . Depression   . 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 Dysrhythmia 2020   Had Afib  . 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 Fibromyalgia   . GERD (gastroesophageal reflux disease)   . History of adenomatous polyp of colon 09/30/2018  . 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 History of colon polyps    2007 hyperplagia  . History of esophageal dilatation    2008  . History of  peptic ulcer   . History of small bowel obstruction    2012  . Hypothyroid 05/28/2011  . Hypothyroidism   . Incomplete emptying of bladder 07/30/2016  . Left foot pain 12/11/2015   Overview:  Dr Cyril Mourning [facility]  . Macular degeneration of right eye   . Mild depression ([place]) 05/28/2011  . Mixed incontinence urge and stress 09/16/2011   S/p lumax desires surgery   . Morbid obesity ([place]) 04/10/2011  . Numbness of left foot    4 toes  . Osteoporosis 12/15/2016   Overview:  Dr Cletis Media  . Paget's disease of vulva   . Personal history of chemotherapy 1997  . Personal history of radiation therapy 2005  . PONV (postoperative nausea and vomiting)    and HARD TO WAKE  . Seasonal allergies   . Shortness of breath   . SKIN CANCER, HX OF 08/23/2007   Qualifier: Diagnosis of  By: Doy Mince LPN, Megan    . SUI (stress urinary incontinence, female)   . Thrombocytopenic disorder ([place]) 02/25/2017  . Wears glasses     Past Surgical History:  Procedure Laterality Date  . BLADDER SUSPENSION  10/13/2011   Procedure: TRANSVAGINAL TAPE (TVT) PROCEDURE;  Surgeon: Delice Lesch, MD;  Location: [facility];  Service: Gynecology;  Laterality: N/A;  . BREAST BIOPSY Left 03/19/2004  . BREAST  LUMPECTOMY Left 03-27-2004  . CARDIAC CATHETERIZATION  10-30-2000  dr Wynonia Lawman   normal coronary arteries and LVF/  ef 65-70%  . CLOSED MANIPULATION  W/ OPEN REDUCTION EXCHANGE TIBIAL FEMORAL BEARING POST TOTAL LEFT KNEE  04-14-2001  . CYSTOSCOPY  10/13/2011   Procedure: CYSTOSCOPY;  Surgeon: Delice Lesch, MD;  Location: [facility];  Service: Gynecology;  Laterality: Bilateral;  . DIAGNOSTIC LAPAROSCOPY     Colon d/t SBO  . DILATION AND CURETTAGE OF UTERUS  1968  . FOOT SURGERY Left 02-01-2009   TRIPLE ARTHRODESIS  . FOOT SURGERY Left 11/14   I&D left foot with woundvac placement  . INCISIONAL HERNIA REPAIR  05/05/2011   Procedure: LAPAROSCOPIC INCISIONAL HERNIA;  Surgeon: Edward Jolly, MD;  Location: [facility];  Service:  General;  Laterality: N/A;  LAPAROSCOPIC REPAIR INCARCERATED INCISIONAL HERNIA  WITH MESH  . KNEE ARTHROSCOPY Bilateral left [id number] & 04-1992/   right [phone]  . LAPAROSCOPIC CHOLECYSTECTOMY  06-21-2010  . LEFT HEART CATH AND CORONARY ANGIOGRAPHY N/A 04/13/2017   Procedure: LEFT HEART CATH AND CORONARY ANGIOGRAPHY;  Surgeon: Jettie Booze, MD;  Location: [facility];  Service: Cardiovascular;  Laterality: N/A;  . LUMBAR SPINE SURGERY  1998   L5 -- S1  . MASTECTOMY Right 1997   W/  LYMPH NODE DISSECTION AND RECONSTRUCTION WITH IMPLANTS AND EXPANDER  . NEGATIVE SLEEP STUDY  12/26/2015  . PORT-A-CATH PLACEMENT AND REMOVAL  1997  . REMOVAL RIGHT BREAST IMPLANT AND CAPSULECTOMY  06-03-1999  . REVISION TOTAL KNEE ARTHROPLASTY Left 07-07-2005  &  12-10-2001   12-10-2001  REMOVAL TOTAL KNEE/ I&D / INSERTION ANTIBIOTIC SPACER  . REVISION TOTAL KNEE ARTHROPLASTY Right 07-07-2005  . ROTATOR CUFF REPAIR Right   . TENDON REPAIR RIGHT RING FINGER  2011  . THUMB TRIGGER RELEASE Right 1993  . THYROIDECTOMY, PARTIAL  1983  . TONSILLECTOMY  1968  . TOTAL KNEE ARTHROPLASTY Bilateral right 05-04-2000/  left 03-29-2001  . VULVECTOMY  05/27/2012   Procedure: WIDE EXCISION VULVECTOMY;  Surgeon: Imagene Gurney A. Alycia Rossetti, MD;  Location: [facility];  Service: Gynecology;  Laterality: N/A;  Wide Local Excision of Vulva   . VULVECTOMY N/A 11/23/2012   Procedure: WIDE LOCAL EXCISION OF THE VULVA;  Surgeon: Imagene Gurney A. Alycia Rossetti, MD;  Location: [facility];  Service: Gynecology;  Laterality: N/A;  left inferior vulvar biopsy excision left superior lesion left inferior vulvar excision  . VULVECTOMY N/A 08/02/2013   Procedure: WIDE LOCAL  EXCISION VULVA;  Surgeon: Imagene Gurney A. Alycia Rossetti, MD;  Location: [facility];  Service: Gynecology;  Laterality: N/A;  . VULVECTOMY Left 03/29/2014   Procedure: WIDE LOCAL EXCISION OF LEFT VULVA ;  Surgeon: Imagene Gurney A. Alycia Rossetti, MD;  Location: [facility];  Service: Gynecology;  Laterality: Left;  Marland Kitchen  VULVECTOMY Bilateral 01/24/2015   Procedure: WIDE LOCAL EXCISION VULVA;  Surgeon: [name], MD;  Location: [facility];  Service: Gynecology;  Laterality: Bilateral;  . VULVECTOMY PARTIAL  07-16-2010    Family History  Problem Relation Age of Onset  . Cancer Father 22       lung and kidney   . Alzheimer's disease Mother   . Heart attack Maternal Grandmother   . Alcohol abuse Brother   . Breast cancer Paternal Aunt   . Breast cancer Cousin   . Breast cancer Cousin    Social History:  reports that she quit smoking about 20 years ago. Her smoking use included cigarettes. She has a 82.00 pack-year smoking  history. She has never used smokeless tobacco. She reports that she does not drink alcohol or use drugs.  Allergies:  Allergies  Allergen Reactions  . Demerol  [Meperidine Hcl] Other (See Comments)    "Knocks me out"  . Codeine Other (See Comments)    Hyper/ Hallucinations   . Diazepam Other (See Comments)    Extreme sedation; patient does not recall taking diazepam (12/07/14)  . Tape     "thick clear plastic tape" causes blisters   . Hydromorphone Hcl Itching and Rash  . Macrobid [Nitrofurantoin] Rash  . Morphine And Related Itching and Rash  . Penicillins Hives and Swelling    1968  . Valisone [Betamethasone] Rash    Medications Prior to Admission  Medication Sig Dispense Refill  . acetaminophen (TYLENOL) 500 MG tablet Take 1,000 mg by mouth every 6 (six) hours as needed for mild pain or moderate pain.    Marland Kitchen aspirin EC 81 MG EC tablet Take 1 tablet (81 mg total) daily by mouth. 30 tablet 0  . atorvastatin (LIPITOR) 40 MG tablet Take 1 tablet (40 mg total) daily at 6 PM by mouth. 30 tablet 0  . bisoprolol (ZEBETA) 5 MG tablet Take 1 tablet (5 mg total) by mouth daily. 30 tablet 2  . budesonide-formoterol (SYMBICORT) 160-4.5 MCG/ACT inhaler Inhale 2 puffs into the lungs daily as needed (Wheezing in the winter).     Marland Kitchen buPROPion (WELLBUTRIN XL) 150 MG 24 hr  tablet Take 150 mg by mouth every morning.     . Cholecalciferol (VITAMIN D3) 1000 UNITS CAPS Take 2,000 Units by mouth daily.     . citalopram (CELEXA) 40 MG tablet Take 40 mg by mouth every morning.     . furosemide (LASIX) 40 MG tablet Take 40 mg by mouth daily. Take additional an 40 mg dose if needed for fluid    . hydrOXYzine (VISTARIL) 25 MG capsule Take 25 mg by mouth 3 (three) times daily as needed for itching.    . ibandronate (BONIVA) 150 MG tablet Take 150 mg every 30 (thirty) days by mouth. Take in the morning with a full glass of water, on an empty stomach, and do not take anything else by mouth or lie down for the next 30 min.    Marland Kitchen ipratropium-albuterol (DUONEB) 0.5-2.5 (3) MG/3ML SOLN Take 3 mLs by nebulization every 4 (four) hours as needed (Wheezing).     Marland Kitchen ketoconazole (NIZORAL) 2 % shampoo Apply 1 application topically every 14 (fourteen) days.    Marland Kitchen levothyroxine (SYNTHROID) 200 MCG tablet Take 200 mcg by mouth daily before breakfast.     . meloxicam (MOBIC) 15 MG tablet Take 1 tablet (15 mg total) by mouth every other day. 30 tablet 0  . mexiletine (MEXITIL) 150 MG capsule Take 1 capsule (150 mg total) by mouth 3 (three) times daily. 60 capsule 3  . montelukast (SINGULAIR) 10 MG tablet Take 10 mg daily by mouth.     . Multiple Vitamin (MULTIVITAMIN WITH MINERALS) TABS tablet Take 1 tablet daily by mouth.    . nystatin (MYCOSTATIN/NYSTOP) powder Apply topically 4 (four) times daily. 60 g 6  . nystatin cream (MYCOSTATIN) Apply 1 application topically 2 (two) times daily. 30 g 6  . Omega-3 1000 MG CAPS Take 1,000 mg by mouth daily.     . pantoprazole (PROTONIX) 40 MG tablet Take 40 mg by mouth daily.     . polyvinyl alcohol (LIQUIFILM TEARS) 1.4 % ophthalmic solution Place 1 drop into  both eyes daily as needed for dry eyes.     . potassium chloride (K-DUR) 10 MEQ tablet Take 10 mEq by mouth daily.     . traMADol (ULTRAM) 50 MG tablet Take 1 tablet (50 mg total) by mouth daily as  needed. 20 tablet 0  . albuterol (PROVENTIL HFA;VENTOLIN HFA) 108 (90 BASE) MCG/ACT inhaler Inhale 2 puffs into the lungs every 4 (four) hours as needed for wheezing or shortness of breath. Reported on 08/01/2015    . clindamycin (CLEOCIN) 150 MG capsule Take 4 tablets 30 minutesprior to dental procedure.      No results found for this or any previous visit (from the past 48 hour(s)). No results found.  Review of Systems  Musculoskeletal: Positive for arthralgias.  All other systems reviewed and are negative.   Blood pressure (!) 117/48, pulse 69, temperature 98.2 F (36.8 C), temperature source Oral, resp. rate 17, height 5\' 5"  (1.651 m), weight 109.8 kg, SpO2 99 %. Physical Exam  Constitutional: She appears well-developed.  HENT:  Head: Normocephalic.  Eyes: Pupils are equal, round, and reactive to light.  Cardiovascular: Normal rate.  Respiratory: Effort normal.  Musculoskeletal:     Cervical back: Normal range of motion.  Neurological: She is alert.  Skin: Skin is warm.  Psychiatric: She has a normal mood and affect.  Left shoulder deltoid is functional.  Range of motion is limited to much less than 90 degrees of forward flexion and abduction and.  External rotation at 15 degrees of abduction and approximately 20 degrees.  Motor or sensory function to the left hand intact.  Rotator cuff strength assessment limited by pain and contracture.  Assessment/Plan Impression is end-stage left shoulder arthritis.  Plan is reverse shoulder replacement.  The risk and benefits are discussed.  They include but are not limited to infection, instability, nerve damage, as well as potential for revision.  All questions answered.  Anderson Malta, MD 10/25/2019, 6:44 AM

## 2019-10-25 NOTE — Anesthesia Postprocedure Evaluation (Signed)
Anesthesia Post Note  Patient: [name]  Procedure(s) Performed: LEFT REVERSE SHOULDER ARTHROPLASTY (Left Shoulder)     Patient location during evaluation: PACU Anesthesia Type: General Level of consciousness: awake and alert and oriented Pain management: pain level controlled Vital Signs Assessment: post-procedure vital signs reviewed and stable Respiratory status: spontaneous breathing, nonlabored ventilation and respiratory function stable Cardiovascular status: blood pressure returned to baseline Postop Assessment: no apparent nausea or vomiting Anesthetic complications: no    Last Vitals:  Vitals:   10/25/19 1130 10/25/19 1145  BP: (!) 99/56   Pulse: 69   Resp: 18 20  Temp:    SpO2: 95% 94%    Last Pain:  Vitals:   10/25/19 1145  TempSrc:   PainSc: 0-No pain                 Brennan Bailey

## 2019-10-25 NOTE — Brief Op Note (Signed)
   10/25/2019  10:46 AM  PATIENT:  [name]  74 y.o. female  PRE-OPERATIVE DIAGNOSIS:  left shoulder osteoarthritis  POST-OPERATIVE DIAGNOSIS:  left shoulder osteoarthritis  PROCEDURE:  Procedure(s): LEFT REVERSE SHOULDER ARTHROPLASTY  SURGEON:  Surgeon(s): Meredith Pel, MD  ASSISTANT: magnant pa  ANESTHESIA:   general  EBL: 100 ml    Total I/O In: 1850 [I.V.:1300; IV Piggyback:550] Out: 100 [Blood:100]  BLOOD ADMINISTERED: none  DRAINS: none   LOCAL MEDICATIONS USED:  none  SPECIMEN:  No Specimen  COUNTS:  YES  TOURNIQUET:  * No tourniquets in log *  DICTATION: .Other Dictation: Dictation Number [id number]  PLAN OF CARE: Admit for overnight observation  PATIENT DISPOSITION:  PACU - hemodynamically stable

## 2019-10-25 NOTE — Transfer of Care (Signed)
Immediate Anesthesia Transfer of Care Note  Patient: [name]  Procedure(s) Performed: LEFT REVERSE SHOULDER ARTHROPLASTY (Left Shoulder)  Patient Location: PACU  Anesthesia Type:General  Level of Consciousness: drowsy, patient cooperative and responds to stimulation  Airway & Oxygen Therapy: Patient Spontanous Breathing  Post-op Assessment: Report given to RN, Post -op Vital signs reviewed and stable and Patient moving all extremities  Post vital signs: Reviewed and stable  Last Vitals:  Vitals Value Taken Time  BP 105/58 10/25/19 1048  Temp    Pulse 66 10/25/19 1050  Resp 20 10/25/19 1050  SpO2 100 % 10/25/19 1050  Vitals shown include unvalidated device data.  Last Pain:  Vitals:   10/25/19 0610  TempSrc: Oral  PainSc: 0-No pain      Patients Stated Pain Goal: 2 ([id number])  Complications: No apparent anesthesia complications

## 2019-10-25 NOTE — Op Note (Signed)
NAMEBIANNA, KOBASHIGAWA MEDICAL RECORD [id number] ACCOUNT [id number] DATE OF BIRTH:07-Oct-1945 FACILITY: [facility] LOCATION: [facility] PHYSICIAN:Lulia Schriner Randel Pigg, MD  OPERATIVE REPORT  DATE OF PROCEDURE:  10/25/2019  PREOPERATIVE DIAGNOSIS:  Left shoulder arthritis.  POSTOPERATIVE DIAGNOSIS:  Left shoulder arthritis.  PROCEDURE:  Left shoulder reverse shoulder replacement using Biomet humeral stem size 8 with humeral tray, standard thickness 40 mm diameter with +6 mm taper offset and standard 36 mm highly cross-linked polyethylene bearing with small augmented  baseplate, 1 central compression screw, 4 peripheral locking screws, and 36 standard glenosphere.  SURGEON:  Meredith Pel, MD  ASSISTANT:  Annie Main, PA  INDICATIONS:  [name] is a 74 year old patient with end-stage left shoulder arthritis following proximal humerus fracture years ago.  Presents now for operative management after explanation of risks and benefits.  PROCEDURE IN DETAIL:  The patient was brought to the operating room where general anesthetic was induced.  Preoperative antibiotics administered.  Timeout was called.  Tranexamic acid was administered.  The patient was placed in the beach chair position  with the head in neutral position.  Left shoulder was then prescrubbed with hydrogen peroxide, then alcohol and Betadine, which was allowed to air dry, then prepped with ChloraPrep solution and draped in sterile manner.  Ioban used to cover the entire  operative field.  Timeout was called.  Draping was performed.  The deltopectoral approach was made.  The patient had severely restricted range of motion with about 10-15 degrees of external rotation at 15 degrees of abduction and only about less than 40  degrees of forward flexion and abduction.  Deltopectoral interval was identified.  Cephalic vein mobilized medially.  Deltoid was elevated anterior attachment off the humerus manually.  Biceps tendon was tenodesed to the  pec.  The superior 2 cm of the  pec was released.  It was later repaired.  The circumflex vessels were ligated.  Subdeltoid and subacromial space was freed up manually as well as with a Cobb elevator.  Rotator interval was opened up to the base of the coracoid.  Subscapularis was  tightened.  Subscapularis was detached and the capsule was detached around to the 7 o'clock position on the humerus.  Also removed inferior 2 cm from the humeral neck.  Significant deformity was present in the humeral head.  In order to facilitate  dislocation of the humerus, the supraspinatus required release.  The coracohumeral ligament was released.  Shoulder was too contracted for any type of space development without rotator cuff release including the supraspinatus and the upper half of the  infraspinatus.  This allowed dislocation to occur.  The intramedullary reaming was performed up to a size 10, but a size 8 broach fit very nicely within the deformed canal.  A cap was placed and then attention was directed towards the glenoid exposure.   A 360-degree subscap release was performed.  It should be noted that prior to releasing the subscap the axillary nerve was visualized and a vessel loop was placed around it.  It was protected at all times during the remaining portion of the case.  Next,  the capsule was released circumferentially.  Shoulder was very tight.  Axillary nerve was protected.  The guide was then placed on the glenoid and correct placement was confirmed with the use of visual confirmation with the model.  Reaming was then  performed.  The baseplate was placed with excellent bony purchase obtained.  One central screw was placed with good purchase and 4 peripheral locking  screws were placed.  Small glenosphere was then placed with good position achieved.  Next, attention was  directed towards the humerus.  A trial reduction was performed with the thinnest construct possible and that was tight, but it was reducible  and it allowed external rotation to about 70 degrees without impingement or levering.  Also allowed forward  flexion and abduction up to just past or close to 90 degrees.  Next, the trial components were removed.  True components were placed.  Subscap was not repairable.  Same stability parameters were maintained.  The patient had very good stability to  adduction, extension and a superior force.  The true components were placed and thorough irrigation was performed.  Vancomycin powder placed.  Deltopectoral interval was then closed using #1 Vicryl suture followed by interrupted inverted 0 Vicryl suture,  2-0 Vicryl suture and a 3-0 Monocryl.  Steri-Strips, Aquacel dressing applied.  A shoulder sling applied.  The patient tolerated the procedure well without immediate complications.  Axillary nerve was palpable and intact and the vessel loop was removed  prior to closure.  His assistance was required at all times for opening and closing and tissue mobilization and retraction.  His assistance was a medical necessity.  CN/NUANCE  D:10/25/2019 T:10/25/2019 JOB:[id number]

## 2019-10-25 NOTE — Anesthesia Procedure Notes (Signed)
Anesthesia Regional Block: Interscalene brachial plexus block   Pre-Anesthetic Checklist: ,, timeout performed, Correct Patient, Correct Site, Correct Laterality, Correct Procedure, Correct Position, site marked, Risks and benefits discussed, pre-op evaluation,  At surgeon's request and post-op pain management  Laterality: Left  Prep: Maximum Sterile Barrier Precautions used, chloraprep       Needles:  Injection technique: Single-shot  Needle Type: Echogenic Stimulator Needle     Needle Length: 9cm  Needle Gauge: 22     Additional Needles:   Procedures:,,,, ultrasound used (permanent image in chart),,,,  Narrative:  Start time: 10/25/2019 7:07 AM End time: 10/25/2019 7:09 AM Injection made incrementally with aspirations every 5 mL.  Performed by: Personally  Anesthesiologist: Brennan Bailey, MD  Additional Notes: Risks, benefits, and alternative discussed. Patient gave consent for procedure. Patient prepped and draped in sterile fashion. Sedation administered, patient remains easily responsive to voice. Relevant anatomy identified with ultrasound guidance. Local anesthetic given in 5cc increments with no signs or symptoms of intravascular injection. No pain or paraesthesias with injection. Patient monitored throughout procedure with signs of LAST or immediate complications. Tolerated well. Ultrasound image placed in chart.  Tawny Asal, MD

## 2019-10-26 ENCOUNTER — Encounter: Payer: Self-pay | Admitting: *Deleted

## 2019-10-26 DIAGNOSIS — M19012 Primary osteoarthritis, left shoulder: Secondary | ICD-10-CM | POA: Diagnosis not present

## 2019-10-26 MED ORDER — POLYETHYLENE GLYCOL 3350 17 G PO PACK
17.0000 g | PACK | Freq: Two times a day (BID) | ORAL | Status: DC | PRN
  Administered 2019-10-26 – 2019-10-27 (×2): 17 g via ORAL
  Filled 2019-10-26 (×2): qty 1

## 2019-10-26 MED ORDER — ORAL CARE MOUTH RINSE
15.0000 mL | Freq: Two times a day (BID) | OROMUCOSAL | Status: DC
  Administered 2019-10-26 – 2019-10-28 (×5): 15 mL via OROMUCOSAL

## 2019-10-26 NOTE — Plan of Care (Signed)
  Problem: Education: Goal: Knowledge of General Education information will improve Description: Including pain rating scale, medication(s)/side effects and non-pharmacologic comfort measures Outcome: Progressing   Problem: Activity: Goal: Risk for activity intolerance will decrease Outcome: Progressing Note: Walked to bathroom with one assist and cane, tolerated well   Problem: Nutrition: Goal: Adequate nutrition will be maintained Outcome: Progressing   Problem: Coping: Goal: Level of anxiety will decrease Outcome: Progressing   Problem: Pain Managment: Goal: General experience of comfort will improve Outcome: Progressing Note: Did not have to treat any PRN pain, scheduled medication helped pt, still pretty numb on whole left arm/fingers   Problem: Safety: Goal: Ability to remain free from injury will improve Outcome: Progressing   Problem: Skin Integrity: Goal: Risk for impaired skin integrity will decrease Outcome: Progressing

## 2019-10-26 NOTE — Evaluation (Signed)
Occupational Therapy Evaluation Patient Details Name: [name] MRN: [id number] DOB: 09/28/1945 Today's Date: 10/26/2019    History of Present Illness LYZBETH Moreno is a 74 y.o. female s/p left RSA. 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    Clinical Impression   PTA, pt was living at home with her husband, pt reports she was independent with ADL/IADL and modified independent with functional mobility at cane level. Pt currently requires modA for functional mobility at cane level, pt with notable instability with ambulation, able to tolerate walking about 4 steps before requiring return to sitting. Pt on 2lnc at start of session SpO2 >95%, on RA SpO2 >93% at rest, down to 86% with mobility. Pt's husband present during session, he voiced his concerns with pt being able to safely get into house. Due to decline in current level of function, pt would benefit from acute OT to address established goals to facilitate safe D/C to venue listed below. At this time, recommend SNF follow-up. Pt reports she would be more interested in CIR and needs more time to think about SNF. Will continue to follow acutely.     Follow Up Recommendations  SNF;Supervision/Assistance - 24 hour    Equipment Recommendations  3 in 1 bedside commode    Recommendations for Other Services       Precautions / Restrictions Precautions Precautions: Fall;Shoulder Shoulder Interventions: Shoulder sling/immobilizer;At all times;Off for dressing/bathing/exercises Precaution Booklet Issued: Yes (comment) Precaution Comments: verbally reviewed provided handout Required Braces or Orthoses: Sling Restrictions Weight Bearing Restrictions: Yes Other Position/Activity Restrictions: NWB      Mobility Bed Mobility Overal bed mobility: Needs Assistance Bed Mobility: Supine to Sit;Sit to Supine      Supine to sit: HOB elevated;Mod assist Sit to supine: Min assist;HOB elevated   General bed mobility comments: modA to progress trunk to upright position;minA return for trunk management  Transfers Overall transfer level: Needs assistance Equipment used: Straight cane Transfers: Sit to/from [employer] Transfers Sit to Stand: Mod assist Stand pivot transfers: Mod assist            Balance                                           ADL either performed or assessed with clinical judgement   ADL Overall ADL's : Needs assistance/impaired Eating/Feeding: Set up;Sitting   Grooming: Minimal assistance;Sitting   Upper Body Bathing: Minimal assistance;Sitting   Lower Body Bathing: Maximal assistance;Sitting/lateral leans   Upper Body Dressing : Maximal assistance;Sitting Upper Body Dressing Details (indicate cue type and reason): maxA to don/doff sling and gown Lower Body Dressing: Maximal assistance;Sitting/lateral leans   Toilet Transfer: Moderate assistance;Stand-pivot Toilet Transfer Details (indicate cue type and reason): modA for stand pivot with use of cane pt with instability Toileting- Clothing Manipulation and Hygiene: Moderate assistance;Sit to/from stand       Functional mobility during ADLs: Moderate assistance;Cane General ADL Comments: pt limited by decreased functional use of LUE, decreased activity tolerance, decreased endurance,      Vision Baseline Vision/History: Macular Degeneration       Perception     Praxis      Pertinent Vitals/Pain Pain Assessment: Faces Faces Pain Scale: Hurts even more Pain Location: left shoulder Pain Descriptors / Indicators: Grimacing;Guarding Pain Intervention(s):  Monitored during session;Limited activity within patient's tolerance;Repositioned     Hand Dominance Right   Extremity/Trunk Assessment Upper Extremity Assessment Upper Extremity Assessment: LUE deficits/detail LUE Deficits /  Details: pt reports nerve block still active, decreased sensation;limited assessment secondary to nerve block and shoulder precautions;able to complete AROM open/close fist, AAROM wrist ROM;AAROM elbow flexion LUE: Unable to fully assess due to immobilization LUE Sensation: decreased light touch LUE Coordination: decreased fine motor;decreased gross motor   Lower Extremity Assessment Lower Extremity Assessment: Defer to PT evaluation;Generalized weakness   Cervical / Trunk Assessment Cervical / Trunk Assessment: Normal   Communication Communication Communication: No difficulties   Cognition Arousal/Alertness: Awake/alert Behavior During Therapy: WFL for tasks assessed/performed;Anxious Overall Cognitive Status: Within Functional Limits for tasks assessed                                 General Comments: pt appearing slightly anxious during session, very fidgeting with movement   General Comments  pt's husband present during session    Exercises     Shoulder Instructions      Home Living Family/patient expects to be discharged to:: Private residence Living Arrangements: Spouse/significant other Available Help at Discharge: Family;Available 24 hours/day Type of Home: House Home Access: Stairs to enter [employer] of Steps: 1 Entrance Stairs-Rails: None Home Layout: One level         Biochemist, clinical: Standard     Home Equipment: Cane - single point          Prior Functioning/Environment Level of Independence: Needs assistance  Gait / Transfers Assistance Needed: pt was modified independent at spc level ADL's / Homemaking Assistance Needed: pt required assistance for LB dressing            OT Problem List: Decreased strength;Decreased range of motion;Decreased activity tolerance;Impaired balance (sitting and/or standing);Decreased safety awareness;Decreased knowledge of use of DME or AE;Pain;Impaired UE functional use;Obesity;Impaired  sensation      OT Treatment/Interventions: Self-care/ADL training;Therapeutic exercise;Energy conservation;DME and/or AE instruction;Therapeutic activities;Patient/family education;Balance training    OT Goals(Current goals can be found in the care plan section) Acute Rehab OT Goals Patient Stated Goal: to go home OT Goal Formulation: With patient Time For Goal Achievement: 11/09/19 Potential to Achieve Goals: Good  OT Frequency: Min 2X/week   Barriers to D/C: Decreased caregiver support  husband at home, unsure how much support he is able to provide, a bit disengaged during session       Co-evaluation              AM-PAC OT "6 Clicks" Daily Activity     Outcome Measure Help from another person eating meals?: A Little Help from another person taking care of personal grooming?: A Little Help from another person toileting, which includes using toliet, bedpan, or urinal?: A Little Help from another person bathing (including washing, rinsing, drying)?: A Little Help from another person to put on and taking off regular upper body clothing?: A Little Help from another person to put on and taking off regular lower body clothing?: A Lot 6 Click Score: 17   End of Session Equipment Utilized During Treatment: Gait belt(cane) Nurse Communication: Mobility status  Activity Tolerance: Patient tolerated treatment well Patient left: in bed;with call bell/phone within reach;with bed alarm set  OT Visit Diagnosis: Unsteadiness on feet (R26.81);Other abnormalities of gait and mobility (R26.89);Muscle weakness (generalized) (M62.81);Pain Pain - Right/Left: Left Pain - part of body: Shoulder  [id number] OT Time Calculation (min): 47 min Charges:  OT General Charges $OT Visit: 1 Visit OT Evaluation $OT Eval Moderate Complexity: 1 Mod OT Treatments $Self Care/Home Management : 23-37 mins  Helene Kelp OTR/L Acute Rehabilitation Services Office: [phone]   Wyn Forster 10/26/2019, 1:07 PM

## 2019-10-26 NOTE — Progress Notes (Signed)
  Subjective: [name] is a 74 y.o. female s/p left RSA.  They are POD1.  Pt's pain is controlled.  Block still in effect.  Patient not comfortable with discharge today, wants more time and a therapy session.  No BM since last Saturday.    Objective: Vital signs in last 24 hours: Temp:  [97.5 F (36.4 C)-98.8 F (37.1 C)] 97.9 F (36.6 C) (06/02 0743) Pulse Rate:  [59-84] 72 (06/02 0743) Resp:  [15-20] 16 (06/02 0743) BP: (86-145)/(49-105) 100/70 (06/02 0743) SpO2:  [93 %-99 %] 98 % (06/02 0743)  Intake/Output from previous day: 06/01 0701 - 06/02 0700 In: 4112.4 [P.O.:1320; I.V.:2006.5; IV Piggyback:785.9] Out: 1350 [Urine:1250; Blood:100] Intake/Output this shift: No intake/output data recorded.  Exam:  No gross blood or drainage overlying the dressing 2+ radial pulse Sensation intact distally in the left hand but reduced    Labs: No results for input(s): HGB in the last 72 hours. No results for input(s): WBC, RBC, HCT, PLT in the last 72 hours. No results for input(s): NA, K, CL, CO2, BUN, CREATININE, GLUCOSE, CALCIUM in the last 72 hours. No results for input(s): LABPT, INR in the last 72 hours.  Assessment/Plan: Pt is POD1 s/p left RSA    -Plan to discharge to home tomorrow pending patient's pain  -No lifting with the operative arm  -No fractures noted on postop radiographs   Gerrianne Scale Cynthia Cogle 10/26/2019, 8:28 AM

## 2019-10-26 NOTE — Evaluation (Signed)
Physical Therapy Evaluation Patient Details Name: [name] MRN: [id number] DOB: 10-Apr-1946 Today's Date: 10/26/2019   History of Present Illness  [name] is a 74 y.o. female s/p left RSA. 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   Clinical Impression  Pt admitted with above diagnosis. At the time of PT eval pt was able to perform transfers and ambulation with up to mod assist and [facility] for support. Pt tearful at times as she reports bladder pain but unable to urinate. Pt sat on the commode for ~15 minutes attempting to urinate. Nursing staff aware. Pt currently with functional limitations due to the deficits listed below (see PT Problem List). Pt will benefit from skilled PT to increase their independence and safety with mobility to allow discharge to the venue listed below.       Follow Up Recommendations SNF;Supervision for mobility/OOB    Equipment Recommendations  None recommended by PT    Recommendations for Other Services       Precautions / Restrictions Precautions Precautions: Fall;Shoulder Shoulder Interventions: Shoulder sling/immobilizer;At all times;Off for dressing/bathing/exercises Precaution Booklet Issued: Yes (comment) Precaution Comments: OT issued Required Braces or Orthoses: Sling Restrictions Weight Bearing Restrictions: Yes Other Position/Activity Restrictions: NWB      Mobility  Bed Mobility Overal bed mobility: Needs Assistance Bed Mobility: Supine to Sit     Supine to sit: Mod assist;HOB elevated   General bed mobility comments: Assist for trunk elevation to full sitting position. Pt very adamant that she wants to do it herself, however unsafe as pt trying to gain momentum to sit, and therapist provided posterior support.   Transfers Overall transfer level: Needs assistance Equipment used: Straight  cane Transfers: Sit to/from [employer] Sit to Stand: Min assist       General transfer comment: VC's for hand placement on seated surface for safety, and for wide BOS.   Ambulation/Gait Ambulation/Gait assistance: Min assist Gait Distance (Feet): 25 Feet Assistive device: Straight cane Gait Pattern/deviations: Step-through pattern;Decreased stride length;Shuffle;Trunk flexed Gait velocity: Decreased Gait velocity interpretation: <1.8 ft/sec, indicate of risk for recurrent falls General Gait Details: Occasionally unsteady rqeuiring min assist.   Stairs            Wheelchair Mobility    Modified Rankin (Stroke Patients Only)       Balance Overall balance assessment: Needs assistance Sitting-balance support: Feet supported Sitting balance-Leahy Scale: Good     Standing balance support: No upper extremity supported Standing balance-Leahy Scale: Poor                               Pertinent Vitals/Pain Pain Assessment: Faces Faces Pain Scale: Hurts even more Pain Location: left shoulder; abdomen 2 full bladder Pain Descriptors / Indicators: Grimacing;Guarding Pain Intervention(s): Limited activity within patient's tolerance;Monitored during session;Repositioned    Home Living Family/patient expects to be discharged to:: Private residence Living Arrangements: Spouse/significant other Available Help at Discharge: Family;Available 24 hours/day Type of Home: House Home Access: Stairs to enter Entrance Stairs-Rails: None Entrance Stairs-Number of Steps: 1 Home Layout: One level Home Equipment: Cane - single point      Prior Function Level of Independence: Needs assistance   Gait / Transfers Assistance Needed: pt was modified independent at spc level  ADL's / Homemaking Assistance Needed: pt required assistance for LB dressing  Hand Dominance   Dominant Hand: Right    Extremity/Trunk Assessment   Upper Extremity  Assessment Upper Extremity Assessment: Defer to OT evaluation LUE Deficits / Details: pt reports nerve block still active, decreased sensation;limited assessment secondary to nerve block and shoulder precautions;able to complete AROM open/close fist, AAROM wrist ROM;AAROM elbow flexion LUE: Unable to fully assess due to immobilization LUE Sensation: decreased light touch LUE Coordination: decreased fine motor;decreased gross motor    Lower Extremity Assessment Lower Extremity Assessment: Generalized weakness    Cervical / Trunk Assessment Cervical / Trunk Assessment: Normal(Forward head posture with rounded shoulders)  Communication   Communication: No difficulties  Cognition Arousal/Alertness: Awake/alert Behavior During Therapy: WFL for tasks assessed/performed;Anxious Overall Cognitive Status: Within Functional Limits for tasks assessed                                 General Comments: pt appearing slightly anxious during session, very fidgeting with movement      General Comments General comments (skin integrity, edema, etc.): pt's husband present during session    Exercises     Assessment/Plan    PT Assessment Patient needs continued PT services  PT Problem List Decreased strength;Decreased range of motion;Decreased activity tolerance;Decreased balance;Decreased mobility;Decreased knowledge of use of DME;Decreased safety awareness;Decreased knowledge of precautions;Pain       PT Treatment Interventions DME instruction;Gait training;Stair training;Functional mobility training;Therapeutic activities;Therapeutic exercise;Neuromuscular re-education;Patient/family education    PT Goals (Current goals can be found in the Care Plan section)  Acute Rehab PT Goals Patient Stated Goal: to go home PT Goal Formulation: With patient Time For Goal Achievement: 11/02/19 Potential to Achieve Goals: Good    Frequency Min 3X/week   Barriers to discharge         Co-evaluation               AM-PAC PT "6 Clicks" Mobility  Outcome Measure Help needed turning from your back to your side while in a flat bed without using bedrails?: A Little Help needed moving from lying on your back to sitting on the side of a flat bed without using bedrails?: A Little Help needed moving to and from a bed to a chair (including a wheelchair)?: A Little Help needed standing up from a chair using your arms (e.g., wheelchair or bedside chair)?: A Little Help needed to walk in hospital room?: A Little Help needed climbing 3-5 steps with a Moreno? : A Lot 6 Click Score: 17    End of Session Equipment Utilized During Treatment: Gait belt(Sling) Activity Tolerance: Patient tolerated treatment well Patient left: in chair;with call bell/phone within reach;with chair alarm set;with nursing/sitter in room Nurse Communication: Mobility status PT Visit Diagnosis: Unsteadiness on feet (R26.81);Pain;Difficulty in walking, not elsewhere classified (R26.2) Pain - Right/Left: Left Pain - part of body: Shoulder    Time: 1349-1420 PT Time Calculation (min) (ACUTE ONLY): 31 min   Charges:   PT Evaluation $PT Eval Moderate Complexity: 1 Mod PT Treatments $Gait Training: 8-22 mins        Rolinda Roan, PT, DPT Acute Rehabilitation Services Pager: [phone] Office: [phone]   Thelma Comp 10/26/2019, 3:05 PM

## 2019-10-27 DIAGNOSIS — M19012 Primary osteoarthritis, left shoulder: Secondary | ICD-10-CM | POA: Diagnosis not present

## 2019-10-27 MED ORDER — MAGNESIUM CITRATE PO SOLN
1.0000 | Freq: Once | ORAL | Status: AC
  Administered 2019-10-27: 1 via ORAL
  Filled 2019-10-27: qty 296

## 2019-10-27 MED ORDER — OXYCODONE HCL 5 MG PO TABS: 5.0000 mg | ORAL_TABLET | ORAL | 0 refills | Status: DC | PRN

## 2019-10-27 MED ORDER — CELECOXIB 200 MG PO CAPS: 200.0000 mg | ORAL_CAPSULE | Freq: Every day | ORAL | 0 refills | Status: AC

## 2019-10-27 MED ORDER — METHOCARBAMOL 500 MG PO TABS: 500.0000 mg | ORAL_TABLET | Freq: Four times a day (QID) | ORAL | 0 refills | Status: DC | PRN

## 2019-10-27 NOTE — Progress Notes (Signed)
Patient stable.  Pain controlled.  Numbness slowly wearing off on the hand  Range of motion with occupational therapy progressing in shoulder  Patient has not urinated yet on her own.  She is required catheterizations x4.  Plan at this time is to ambulate the patient.  Okay for her to use a walker.  She does need to urinate on her own before discharge.  Anticipate discharge later this afternoon.

## 2019-10-27 NOTE — Plan of Care (Addendum)
Pt has voided on her own twice today, she does have history of not emptying her bladder all the way and has been told to "sit on the toilet to try and get more urine out of bladder" by her primary doctor. Pt also becomes short of breath when ambulating to the bathroom. It takes her a couple of minutes to catch her breath when sitting back down after ambulation. Have asked PT/OT to see her today but unsure if they will be able to. Spoke with PA and okay for pt to stay if PT unable to work with her this evening. Pt is in agreement with this plan.    Problem: Health Behavior/Discharge Planning: Goal: Ability to manage health-related needs will improve Outcome: Progressing   Problem: Activity: Goal: Risk for activity intolerance will decrease Outcome: Progressing

## 2019-10-28 DIAGNOSIS — M19012 Primary osteoarthritis, left shoulder: Secondary | ICD-10-CM | POA: Diagnosis not present

## 2019-10-28 NOTE — Plan of Care (Signed)

## 2019-10-28 NOTE — Progress Notes (Signed)
Physical Therapy Treatment Patient Details Name: [name] MRN: [id number] DOB: Aug 21, 1945 Today's Date: 10/28/2019    History of Present Illness [name] is a 74 y.o. female s/p left RSA. 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     PT Comments    Pt OOB in recliner upon arrival of PT, agreeable to PT session with focus on progressing functional endurance. The pt was able to demonstrate significant improvements in total ambulation during today's session (2 bouts of 83ft ambulation vs 1 bout of 64ft at prior session), however, the pt had sig drop in SpO2 from 96% at rest on RA to 86% after 50 ft on RA which required 2 min seated rest and guided breathing to recover. The pt was then able to complete a second bout of 50 ft ambulation, but had a similar drop in SpO2 requiring 2.5 min to recover. The pt then reported 8/10 RPE following activity. The pt will continue to benefit from skilled PT to further progress functional strength, endurance, and activity tolerance prior to d/c, and may benefit from supplemental O2 with activity to maintain SpO2 > 90%.     Follow Up Recommendations  SNF;Supervision for mobility/OOB     Equipment Recommendations  None recommended by PT    Recommendations for Other Services       Precautions / Restrictions Precautions Precautions: Fall;Shoulder Shoulder Interventions: Shoulder sling/immobilizer;At all times;Off for dressing/bathing/exercises Precaution Booklet Issued: Yes (comment) Required Braces or Orthoses: Sling Restrictions Weight Bearing Restrictions: No LUE Weight Bearing: Non weight bearing Other Position/Activity Restrictions: NWB    Mobility  Bed Mobility Overal bed mobility: Needs Assistance             General bed mobility comments: pt OOB in recliner upon arrival  Transfers Overall transfer  level: Needs assistance Equipment used: Straight cane Transfers: Sit to/from Stand Sit to Stand: Min guard         General transfer comment: VC for hand placement for safety and transition to holding cane, but no LOB  Ambulation/Gait Ambulation/Gait assistance: Min guard Gait Distance (Feet): 50 Feet(+ 50 ft) Assistive device: Straight cane Gait Pattern/deviations: Step-through pattern;Decreased stride length;Shuffle;Trunk flexed Gait velocity: 0.28 m/s Gait velocity interpretation: <1.31 ft/sec, indicative of household ambulator General Gait Details: pt with improved stability, but sig increased SOB through ambulation, pt SpO2 dropped from 96% to 86% after walking 50 ft on RA. recovered to 95% with 2 min seated rest on RA with PLB. Pt then ambulated back to room, again with SpO2 dropping to 87% and recovering to 95% in 2:30 min seated rest.   Stairs             Wheelchair Mobility    Modified Rankin (Stroke Patients Only)       Balance Overall balance assessment: Needs assistance Sitting-balance support: Feet supported Sitting balance-Leahy Scale: Good     Standing balance support: No upper extremity supported Standing balance-Leahy Scale: Poor Standing balance comment: able to static stand, reaching for UE support with all mobility                            Cognition Arousal/Alertness: Awake/alert Behavior During Therapy: WFL for tasks assessed/performed Overall Cognitive Status: Within Functional Limits for tasks assessed  General Comments: Pt with reduced anxiety during session comared to prior PT session. Agreeable and coopertive to all PT suggestions, but continues to use RUE to hold bed/counter through session rather than continuing to use cane. Dicussed safety techniques and transition to RW when cleared by surgeon      Exercises      General Comments General comments (skin integrity, edema,  etc.): SpO2 95% on RA at rest, dropped to 86% with 50 ft ambulation using cane, recovered to 95% with 2 min seated rest. After second bout of 50 ft, SpO2 dropped from 95 to 87% gradually and recovered to 94% in 2.5 min seated rest. Pt reports 8/10 fatigue following bouts of ambulation.      Pertinent Vitals/Pain Pain Assessment: No/denies pain Pain Intervention(s): Limited activity within patient's tolerance;Monitored during session;Repositioned    Home Living                      Prior Function            PT Goals (current goals can now be found in the care plan section) Acute Rehab PT Goals Patient Stated Goal: to go home PT Goal Formulation: With patient Time For Goal Achievement: 11/02/19 Potential to Achieve Goals: Good Progress towards PT goals: Progressing toward goals    Frequency    Min 3X/week      PT Plan Current plan remains appropriate    Co-evaluation              AM-PAC PT "6 Clicks" Mobility   Outcome Measure  Help needed turning from your back to your side while in a flat bed without using bedrails?: A Little Help needed moving from lying on your back to sitting on the side of a flat bed without using bedrails?: A Little Help needed moving to and from a bed to a chair (including a wheelchair)?: A Little Help needed standing up from a chair using your arms (e.g., wheelchair or bedside chair)?: A Little Help needed to walk in hospital room?: A Little Help needed climbing 3-5 steps with a railing? : A Lot 6 Click Score: 17    End of Session Equipment Utilized During Treatment: Gait belt(sling) Activity Tolerance: Patient tolerated treatment well Patient left: in chair;with call bell/phone within reach;with chair alarm set;with nursing/sitter in room Nurse Communication: Mobility status PT Visit Diagnosis: Unsteadiness on feet (R26.81);Pain;Difficulty in walking, not elsewhere classified (R26.2) Pain - Right/Left: Left Pain - part of  body: Shoulder     Time: 9798-9211 PT Time Calculation (min) (ACUTE ONLY): 31 min  Charges:  $Gait Training: 23-37 mins                     Karma Ganja, PT, DPT   Acute Rehabilitation Department Pager #: [phone]   Otho Bellows 10/28/2019, 10:06 AM

## 2019-10-28 NOTE — Progress Notes (Signed)
  Subjective: [name] is a 74 y.o. female s/p left RSA.  Pt's pain is controlled.  She has had 2 bowel movements; magnesium citrate worked well.  She has had some SOB when ambulating with PT.  She denies any CP.  She notes that this SOB is no different than her baseline SOB that she experienced prior to surgery.  No history of COPD diagnosis but she did smoke cigarettes for 41 years and has not seen a pulmonologist.  She has seen a cardiologist who has cleared patient, according to her.    Objective: Vital signs in last 24 hours: Temp:  [97.6 F (36.4 C)-98.3 F (36.8 C)] 97.9 F (36.6 C) (06/04 0803) Pulse Rate:  [66-80] 80 (06/04 0803) Resp:  [14-17] 14 (06/04 0803) BP: (105-108)/(53-75) 107/75 (06/04 0803) SpO2:  [92 %-96 %] 92 % (06/04 0803)  Intake/Output from previous day: 06/03 0701 - 06/04 0700 In: 720 [P.O.:720] Out: 3472 [Urine:3472] Intake/Output this shift: No intake/output data recorded.  Exam:  No gross blood or drainage overlying the dressing 2+ radial pulse Sensation intact distally in the left hand Able to extend the left wrist   Labs: No results for input(s): HGB in the last 72 hours. No results for input(s): WBC, RBC, HCT, PLT in the last 72 hours. No results for input(s): NA, K, CL, CO2, BUN, CREATININE, GLUCOSE, CALCIUM in the last 72 hours. No results for input(s): LABPT, INR in the last 72 hours.  Assessment/Plan: Pt is POD3 s/p left RSA    -Plan to discharge to home today   -No lifting with the operative arm  -SOB is baseline, no worse than normal for her. Recommended that she make an appointment with her PCP for evaluation with potential referral to pulmonology.  Will re-eval when she returns for first postop appointment.     Taysia Rivere L Sharonlee Nine 10/28/2019, 12:40 PM

## 2019-10-28 NOTE — Progress Notes (Signed)
Pt IV removed, belongings gathered and pt dressed. AVS reviewed with pt and family at bedside, all questions answered to pt satisfaction. Pt taken down with husband to be dc home.

## 2019-10-28 NOTE — Progress Notes (Signed)
Occupational Therapy Treatment Patient Details Name: [name] MRN: [id number] DOB: Mar 31, 1946 Today's Date: 10/28/2019    History of present illness [name] is a 74 y.o. female s/p left RSA. 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    OT comments  Pt. Seen for skilled OT treatment session.  Pt. Able to follow ROM for digits, wrist, elbow LUE for edema management.  Pt. Able to amb. To/from b.room for toileting task.  Cues for hand placement and safety during ambulation as she would move cane to l hand and hold onto doorways with r hand.    At end of session pt. With visible shortness of breath.  Checked with pulse ox and pt. Was at 95% o2 but cont. To breathe heavily.  I asked her how she felt and she states "I just get out of breath sometimes after I walk".  PT arrived after my session and I reviewed information with PT for her to also monitor during their session.     Follow Up Recommendations  SNF;Supervision/Assistance - 24 hour    Equipment Recommendations  3 in 1 bedside commode    Recommendations for Other Services      Precautions / Restrictions Precautions Precautions: Fall;Shoulder Shoulder Interventions: Shoulder sling/immobilizer;At all times;Off for dressing/bathing/exercises Precaution Booklet Issued: Yes (comment) Required Braces or Orthoses: Sling Restrictions Weight Bearing Restrictions: No LUE Weight Bearing: Non weight bearing Other Position/Activity Restrictions: NWB       Mobility Bed Mobility Overal bed mobility: Needs Assistance             General bed mobility comments: seated eob texting on her phone upon arrival  Transfers Overall transfer level: Needs assistance Equipment used: Straight cane Transfers: Sit to/from Stand;Stand Pivot Transfers Sit to Stand: Min guard Stand pivot transfers: Min guard        General transfer comment: VC for hand placement for safety and transition to holding cane, but no LOB    Balance Overall balance assessment: Needs assistance Sitting-balance support: Feet supported Sitting balance-Leahy Scale: Good     Standing balance support: No upper extremity supported Standing balance-Leahy Scale: Poor Standing balance comment: able to static stand, reaching for UE support with all mobility                           ADL either performed or assessed with clinical judgement   ADL Overall ADL's : Needs assistance/impaired                 Upper Body Dressing : Sitting Upper Body Dressing Details (indicate cue type and reason): maxA to don/doff sling, encouraged pt. use r hand to assist placing arm in the sling for more hands on approach so she could don herself if needed     Toilet Transfer: Minimal assistance;Ambulation;Cueing for safety Toilet Transfer Details (indicate cue type and reason): pt. amb. to/from b.room with SPC and intermittent furniture walking, cues for safe hand placement while doing this. Toileting- Clothing Manipulation and Hygiene: Supervision/safety;Sit to/from stand Toileting - Clothing Manipulation Details (indicate cue type and reason): pt. reading the urine amount in the catch/hat then flipped it into the toilet and rinsed it out and flushed     Functional mobility during ADLs: Minimal assistance;Cane General ADL Comments: pt. with intermittent furniture walking. and would place cane in her fingers of LUE. educated  not to have the weight and not to hold onto items that move. pt. receptive but states "i just have my ways of doing things, maybe im wrong but thats how i do things"     Vision       Perception     Praxis      Cognition Arousal/Alertness: Awake/alert Behavior During Therapy: WFL for tasks assessed/performed Overall Cognitive Status: Within Functional Limits for tasks assessed                                  General Comments: Pt with reduced anxiety during session comared to prior PT session. Agreeable and coopertive to all PT suggestions, but continues to use RUE to hold bed/counter through session rather than continuing to use cane. Dicussed safety techniques and transition to RW when cleared by surgeon        Exercises     Shoulder Instructions       General Comments SpO2 95% on RA at rest, dropped to 86% with 50 ft ambulation using cane, recovered to 95% with 2 min seated rest. After second bout of 50 ft, SpO2 dropped from 95 to 87% gradually and recovered to 94% in 2.5 min seated rest. Pt reports 8/10 fatigue following bouts of ambulation.    Pertinent Vitals/ Pain       Pain Assessment: No/denies pain Pain Intervention(s): Limited activity within patient's tolerance;Monitored during session;Repositioned  Home Living                                          Prior Functioning/Environment              Frequency  Min 2X/week        Progress Toward Goals  OT Goals(current goals can now be found in the care plan section)     Acute Rehab OT Goals Patient Stated Goal: to go home ADL Goals Pt Will Perform Upper Body Dressing: with caregiver independent in assisting;sitting Pt Will Transfer to Toilet: with supervision;ambulating;regular height toilet Additional ADL Goal #1: pt will don doff sling MOd I as precursor to exercises. Additional ADL Goal #2: Pt will demonstrate HEP program for L shoulder mod I with handout  Plan Discharge plan remains appropriate    Co-evaluation                 AM-PAC OT "6 Clicks" Daily Activity     Outcome Measure   Help from another person eating meals?: A Little Help from another person taking care of personal grooming?: A Little Help from another person toileting, which includes using toliet, bedpan, or urinal?: A Little Help from another person bathing (including washing, rinsing,  drying)?: A Little Help from another person to put on and taking off regular upper body clothing?: A Little Help from another person to put on and taking off regular lower body clothing?: A Lot 6 Click Score: 17    End of Session Equipment Utilized During Treatment: Gait belt;Other (comment)(cane, sling)  OT Visit Diagnosis: Unsteadiness on feet (R26.81);Other abnormalities of gait and mobility (R26.89);Muscle weakness (generalized) (M62.81);Pain Pain - Right/Left: Left Pain - part of body: Shoulder   Activity Tolerance Patient tolerated treatment well   Patient Left in chair;with call bell/phone within reach   Nurse Communication  Time: 0973-5329 OT Time Calculation (min): 17 min  Charges: OT General Charges $OT Visit: 1 Visit OT Treatments $Self Care/Home Management : 8-22 mins  Sonia Baller, COTA/L Acute Rehabilitation [phone]   Janice Coffin 10/28/2019, 12:42 PM

## 2019-11-03 ENCOUNTER — Ambulatory Visit: Payer: Medicare HMO | Admitting: Podiatry

## 2019-11-09 ENCOUNTER — Encounter: Payer: Self-pay | Admitting: Orthopedic Surgery

## 2019-11-09 ENCOUNTER — Other Ambulatory Visit: Payer: Self-pay

## 2019-11-09 ENCOUNTER — Ambulatory Visit: Payer: Self-pay

## 2019-11-09 ENCOUNTER — Ambulatory Visit (INDEPENDENT_AMBULATORY_CARE_PROVIDER_SITE_OTHER): Payer: Medicare HMO | Admitting: Orthopedic Surgery

## 2019-11-09 DIAGNOSIS — Z96612 Presence of left artificial shoulder joint: Secondary | ICD-10-CM | POA: Diagnosis not present

## 2019-11-09 MED ORDER — METHOCARBAMOL 500 MG PO TABS: 500.0000 mg | ORAL_TABLET | Freq: Three times a day (TID) | ORAL | 0 refills | Status: DC | PRN

## 2019-11-09 MED ORDER — OXYCODONE HCL 5 MG PO TABS: 5.0000 mg | ORAL_TABLET | Freq: Four times a day (QID) | ORAL | 0 refills | Status: DC | PRN

## 2019-11-10 ENCOUNTER — Telehealth: Payer: Self-pay

## 2019-11-10 ENCOUNTER — Encounter: Payer: Self-pay | Admitting: Orthopedic Surgery

## 2019-11-10 NOTE — Telephone Encounter (Signed)
Confirmed she did start care with [facility] on 11/04/19 and also has a visit scheduled with them today. I did call back to [facility] and gave verbal order for continued HHPT for an additional 4 weeks. Had to leave on answering machine. Requested they call back with any questions.

## 2019-11-10 NOTE — Progress Notes (Signed)
Post-Op Visit Note   Patient: [name]           Date of Birth: 05-26-1946           MRN: [id number] Visit Date: 11/09/2019 PCP: System, Pcp Not In   Assessment & Plan:  Chief Complaint:  Chief Complaint  Patient presents with  . Left Shoulder - Routine Post Op   Visit Diagnoses:  1. Status post reverse total replacement of left shoulder     Plan: Patient is a 74 year old female presents s/p left shoulder reverse shoulder arthroplasty on 10/25/2019.  She notes that she is doing well overall and pain is controlled while taking oxycodone every 6 hours as needed.  She has been using her sling as directed.  She has not started CPM machine yet.  She has been doing home health physical therapy.  Postop bandage was removed and incision looks good with no evidence of infection or dehiscence.  Plan to refill pain medication every 8 hours as needed as well as refilling the muscle relaxer.  On exam she has 45 degrees of forward flexion, 60 degrees of abduction, 50 degrees of external rotation.  This should continue to improve as she uses her CPM machine and continues with therapy.  Plan to continue with home health physical therapy due to patient's financial situation.  Focus on passive range of motion and active assisted range of motion as tolerated 2-3 times per week for 4 weeks.  Follow-up in 4 weeks for clinical recheck.  Radiographs taken today reveal a reverse shoulder prosthesis in good position and alignment without any complicating features.  Patient may discontinue sling.  Hold off on lifting anything more than a few pounds.  Follow-Up Instructions: No follow-ups on file.   Orders:  Orders Placed This Encounter  Procedures  . XR Shoulder Left   Meds ordered this encounter  Medications  . oxyCODONE (OXY IR/ROXICODONE) 5 MG immediate release tablet    Sig: Take 1 tablet (5 mg total) by mouth every 6 (six) hours as needed for moderate pain (pain score 4-6).    Dispense:  30 tablet     Refill:  0  . methocarbamol (ROBAXIN) 500 MG tablet    Sig: Take 1 tablet (500 mg total) by mouth every 8 (eight) hours as needed for muscle spasms.    Dispense:  30 tablet    Refill:  0    Imaging: No results found.  PMFS History: Patient Active Problem List   Diagnosis Date Noted  . S/P reverse total shoulder arthroplasty, left 10/25/2019  . History of adenomatous polyp of colon 09/30/2018  . Shortness of breath   . ACS (acute coronary syndrome) ([place]) 04/11/2017  . Chest pain, rule out acute myocardial infarction 04/10/2017  . Bigeminy 04/10/2017  . Chronic cystitis 04/09/2017  . Thrombocytopenic disorder ([place]) 02/25/2017  . Osteoporosis 12/15/2016  . 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 Incomplete emptying of bladder 07/30/2016  . Left foot pain 12/11/2015  . 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 Stopped smoking with greater than 40 pack year history 10/23/2015  . 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 Mixed incontinence urge and stress 09/16/2011  . Cancer of right breast, stage 2 ([place]) 05/28/2011  . 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 Hypothyroid 05/28/2011  . GERD (gastroesophageal reflux disease) 05/28/2011  . Fibromyalgia 05/28/2011  . 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 Mild  depression ([place]) 05/28/2011  . Thrombocytopenia, unspecified ([place]) 05/28/2011  . Morbid obesity ([place]) 04/10/2011  . GERD 11/09/2009  . ALLERGIC RHINITIS 08/23/2007  . ASTHMA 08/23/2007  . COUGH 08/23/2007  . SKIN CANCER, HX OF 08/23/2007   Past Medical History:  Diagnosis Date  . ACS (acute coronary syndrome) ([place]) 04/11/2017  . ALLERGIC RHINITIS 08/23/2007   Qualifier: Diagnosis of  By: Doy Mince LPN, Megan    . Anxiety   . Arthritis   . Asthma   . ASTHMA 08/23/2007   Qualifier: Diagnosis of  By: Doy Mince LPN, Megan    . Bigeminy 04/10/2017  . Bladder neoplasm of uncertain  malignant potential 09/11/2016  . Cancer of right breast, stage 2 ([place]) 05/28/2011   2.8 cm invasive 1 node pos ER pos S/P mastectomy 06/1995 then AC x 4 then Tam X 5   . Chest pain, rule out acute myocardial infarction 04/10/2017  . Chronic constipation   . Chronic cystitis 04/09/2017  . Cough 08/23/2007   Qualifier: Diagnosis of  By: Gwenette Greet MD, Armando Reichert   . Depression   . 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 Dysrhythmia 2020   Had Afib  . 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 Fibromyalgia   . GERD (gastroesophageal reflux disease)   . History of adenomatous polyp of colon 09/30/2018  . 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 History of colon polyps    2007 hyperplagia  . History of esophageal dilatation    2008  . History of peptic ulcer   . History of small bowel obstruction    2012  . Hypothyroid 05/28/2011  . Hypothyroidism   . Incomplete emptying of bladder 07/30/2016  . Left foot pain 12/11/2015   Overview:  Dr Cyril Mourning [facility]  . Macular degeneration of right eye   . Mild depression ([place]) 05/28/2011  . Mixed incontinence urge and stress 09/16/2011   S/p lumax desires surgery   . Morbid obesity ([place]) 04/10/2011  . Numbness of left foot    4 toes  . Osteoporosis 12/15/2016   Overview:  Dr Cletis Media  . Paget's disease of vulva   . Personal history of chemotherapy 1997  . Personal history of radiation therapy 2005  . PONV (postoperative nausea and vomiting)    and HARD TO WAKE  . Seasonal allergies   . Shortness of breath   . SKIN CANCER, HX OF 08/23/2007   Qualifier: Diagnosis of  By: Doy Mince LPN, Megan    . SUI (stress urinary incontinence, female)   . Thrombocytopenic disorder ([place]) 02/25/2017  . Wears glasses     Family History  Problem Relation  Age of Onset  . Cancer Father 76       lung and kidney   . Alzheimer's disease Mother   . Heart attack Maternal Grandmother   . Alcohol abuse Brother   . Breast cancer Paternal Aunt   . Breast cancer Cousin   . Breast cancer Cousin     Past Surgical History:  Procedure Laterality Date  . BLADDER SUSPENSION  10/13/2011   Procedure: TRANSVAGINAL TAPE (TVT) PROCEDURE;  Surgeon: Delice Lesch, MD;  Location: [facility];  Service: Gynecology;  Laterality: N/A;  . BREAST BIOPSY Left 03/19/2004  . BREAST LUMPECTOMY  Left 03-27-2004  . CARDIAC CATHETERIZATION  10-30-2000  dr Wynonia Lawman   normal coronary arteries and LVF/  ef 65-70%  . CLOSED MANIPULATION  W/ OPEN REDUCTION EXCHANGE TIBIAL FEMORAL BEARING POST TOTAL LEFT KNEE  04-14-2001  . CYSTOSCOPY  10/13/2011   Procedure: CYSTOSCOPY;  Surgeon: Delice Lesch, MD;  Location: [facility];  Service: Gynecology;  Laterality: Bilateral;  . DIAGNOSTIC LAPAROSCOPY     Colon d/t SBO  . DILATION AND CURETTAGE OF UTERUS  1968  . FOOT SURGERY Left 02-01-2009   TRIPLE ARTHRODESIS  . FOOT SURGERY Left 11/14   I&D left foot with woundvac placement  . INCISIONAL HERNIA REPAIR  05/05/2011   Procedure: LAPAROSCOPIC INCISIONAL HERNIA;  Surgeon: Edward Jolly, MD;  Location: [facility];  Service: General;  Laterality: N/A;  LAPAROSCOPIC REPAIR INCARCERATED INCISIONAL HERNIA  WITH MESH  . KNEE ARTHROSCOPY Bilateral left [phone] & 04-1992/   right [phone]  . LAPAROSCOPIC CHOLECYSTECTOMY  06-21-2010  . LEFT HEART CATH AND CORONARY ANGIOGRAPHY N/A 04/13/2017   Procedure: LEFT HEART CATH AND CORONARY ANGIOGRAPHY;  Surgeon: Jettie Booze, MD;  Location: [facility];  Service: Cardiovascular;  Laterality: N/A;  . LUMBAR SPINE SURGERY  1998   L5 -- S1  . MASTECTOMY Right 1997   W/  LYMPH NODE DISSECTION AND RECONSTRUCTION WITH IMPLANTS AND EXPANDER  . NEGATIVE SLEEP STUDY  12/26/2015  . PORT-A-CATH PLACEMENT AND REMOVAL  1997  . REMOVAL RIGHT BREAST  IMPLANT AND CAPSULECTOMY  06-03-1999  . REVERSE SHOULDER ARTHROPLASTY Left 10/25/2019   Procedure: LEFT REVERSE SHOULDER ARTHROPLASTY;  Surgeon: Meredith Pel, MD;  Location: [facility];  Service: Orthopedics;  Laterality: Left;  . REVISION TOTAL KNEE ARTHROPLASTY Left 07-07-2005  &  12-10-2001   12-10-2001  REMOVAL TOTAL KNEE/ I&D / INSERTION ANTIBIOTIC SPACER  . REVISION TOTAL KNEE ARTHROPLASTY Right 07-07-2005  . ROTATOR CUFF REPAIR Right   . TENDON REPAIR RIGHT RING FINGER  2011  . THUMB TRIGGER RELEASE Right 1993  . THYROIDECTOMY, PARTIAL  1983  . TONSILLECTOMY  1968  . TOTAL KNEE ARTHROPLASTY Bilateral right 05-04-2000/  left 03-29-2001  . VULVECTOMY  05/27/2012   Procedure: WIDE EXCISION VULVECTOMY;  Surgeon: Imagene Gurney A. Alycia Rossetti, MD;  Location: [facility];  Service: Gynecology;  Laterality: N/A;  Wide Local Excision of Vulva   . VULVECTOMY N/A 11/23/2012   Procedure: WIDE LOCAL EXCISION OF THE VULVA;  Surgeon: Imagene Gurney A. Alycia Rossetti, MD;  Location: [facility];  Service: Gynecology;  Laterality: N/A;  left inferior vulvar biopsy excision left superior lesion left inferior vulvar excision  . VULVECTOMY N/A 08/02/2013   Procedure: WIDE LOCAL  EXCISION VULVA;  Surgeon: Imagene Gurney A. Alycia Rossetti, MD;  Location: [facility];  Service: Gynecology;  Laterality: N/A;  . VULVECTOMY Left 03/29/2014   Procedure: WIDE LOCAL EXCISION OF LEFT VULVA ;  Surgeon: Imagene Gurney A. Alycia Rossetti, MD;  Location: [facility];  Service: Gynecology;  Laterality: Left;  Marland Kitchen VULVECTOMY Bilateral 01/24/2015   Procedure: WIDE LOCAL EXCISION VULVA;  Surgeon: [name], MD;  Location: [facility];  Service: Gynecology;  Laterality: Bilateral;  . VULVECTOMY PARTIAL  07-16-2010   Social History   Occupational History  . Not on file  Tobacco Use  . Smoking status: Former Smoker    Packs/day: 2.00    Years: 41.00    Pack years: 82.00    Types: Cigarettes    Quit date: 06/05/1999    Years since quitting: 20.4  . Smokeless tobacco:  Never Used  Vaping Use  . Vaping Use: Never used  Substance and Sexual Activity  . Alcohol use: No  . Drug use: No  . Sexual activity: Not on file

## 2019-11-10 NOTE — Telephone Encounter (Signed)
Looks like per the case management note at the hospital, she set up [facility] for [facility]. Let me call them and see what has happened. I'll get back with you. She is [employer]- big problem getting [place] for these patients in this area.

## 2019-11-10 NOTE — Telephone Encounter (Signed)
Could you please ask them to continue with HHPT x 4 weeks per Dr Forbes Cellar request.

## 2019-11-10 NOTE — Telephone Encounter (Signed)
Patient saw Dr Marlou Sa yesterday for her initial post op visit from reverse shoulder arthroplasty. [facility] was unable to assist with HHPT due to them being out of network with patients insurance. Can you help me figure out if this was not initiated by case manager before patient being discharged from hospital? Also how can we get HHPT set up at this point?

## 2019-11-11 ENCOUNTER — Telehealth: Payer: Self-pay

## 2019-11-11 NOTE — Telephone Encounter (Signed)
Tried calling patient to clarify. No answer. LMVM for her to [facility].  Also tried calling [facility] to discuss, was unable to reach anyone as they only had an automated system available that would not allow me to speak to operator.

## 2019-11-11 NOTE — Telephone Encounter (Signed)
Merwyn Katos, PT with [facility] called stating that patient is very upset due to CPM machine being non operative.  Stated that she has called the company customer service and was advised that they have not made this in 54yrs.and machine has been discontinued, also called the company that delivered the machine, but no response.  Would like to know how Dr. Marlou Sa wants to proceed?  Patient did passive ROM on yesterday.  CB# [phone].  Please advise.  Thank you.

## 2019-11-15 NOTE — Discharge Summary (Signed)
Physician Discharge Summary      Patient ID: [name] MRN: [id number] DOB/AGE: 1945/12/30 74 y.o.  Admit date: 10/25/2019 Discharge date: 10/28/2019  Admission Diagnoses:  Active Problems:   S/P reverse total shoulder arthroplasty, left   Discharge Diagnoses:  Same  Surgeries: Procedure(s): LEFT REVERSE SHOULDER ARTHROPLASTY on 10/25/2019   Consultants:   Discharged Condition: Stable  Hospital Course: [name] is an 74 y.o. 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  They were brought to the operating room on 10/25/2019 and underwent the above named procedures.  Pt awoke from anesthesia without complication and was transferred to the floor. On POD1, patient's pain was controlled but she wanted additional PT to help with mobility. She had some SOB and decreased O2 saturation when ambulating with PT but this improved to her baseline by POD3. She was discharged home on POD3. Recommended she follow-up with her PCP regarding her baseline SOB and history of smoking.  Pt will f/u with Dr. Marlou Sa in clinic in ~2 weeks.   Antibiotics given:  Anti-infectives (From admission, onward)   Start     Dose/Rate Route Frequency Ordered Stop   10/25/19 2000  vancomycin (VANCOCIN) IVPB 1000 mg/200 mL premix        1,000 mg 200 mL/hr over 60 Minutes Intravenous Every 12 hours 10/25/19 1337 10/25/19 2249   10/25/19 0932  vancomycin (VANCOCIN) powder  Status:  Discontinued          As needed 10/25/19 0933 10/25/19 1301   10/25/19 0600  vancomycin (VANCOREADY) IVPB 1500 mg/300 mL        1,500 mg 150 mL/hr over 120 Minutes Intravenous On call to O.R. 10/21/19 2683 10/25/19 0715    .  Recent vital signs:  Vitals:   10/28/19 0615 10/28/19 0803  BP: (!) 108/55 107/75  Pulse: 68 80  Resp: 17 14  Temp: 98.3 F (36.8 C) 97.9 F (36.6 C)  SpO2: 94% 92%    Recent laboratory studies:  Results for  orders placed or performed during the hospital encounter of 10/21/19  SARS CORONAVIRUS 2 (TAT 6-24 HRS) Nasopharyngeal Nasopharyngeal Swab   Specimen: Nasopharyngeal Swab  Result Value Ref Range   SARS Coronavirus 2 NEGATIVE NEGATIVE    Discharge Medications:   Allergies as of 10/28/2019      Reactions   Tape    "thick clear plastic tape" causes blisters   Hydromorphone Hcl Itching, Rash   Macrobid [nitrofurantoin] Rash   Morphine And Related Itching, Rash   Penicillins Hives, Swelling   1968   Valisone [betamethasone] Rash      Medication List    STOP taking these medications   clindamycin 150 MG capsule Commonly known as: CLEOCIN   meloxicam 15 MG tablet Commonly known as: Mobic   Omega-3 1000 MG Caps     TAKE these medications   acetaminophen 500 MG tablet Commonly known as: TYLENOL Take 1,000 mg by mouth every 6 (six) hours as needed for mild pain or moderate pain.   albuterol 108 (90 Base) MCG/ACT inhaler Commonly known as: VENTOLIN HFA Inhale 2 puffs into the lungs every 4 (four) hours as needed for wheezing or shortness of breath. Reported on 08/01/2015   aspirin 81 MG EC tablet Take 1 tablet (81 mg total) daily by mouth.   atorvastatin 40 MG tablet Commonly known as: LIPITOR Take 1 tablet (40 mg total) daily at  6 PM by mouth.   bisoprolol 5 MG tablet Commonly known as: ZEBETA Take 1 tablet (5 mg total) by mouth daily.   buPROPion 150 MG 24 hr tablet Commonly known as: WELLBUTRIN XL Take 150 mg by mouth every morning.   celecoxib 200 MG capsule Commonly known as: CELEBREX Take 1 capsule (200 mg total) by mouth daily.   citalopram 40 MG tablet Commonly known as: CELEXA Take 40 mg by mouth every morning.   furosemide 40 MG tablet Commonly known as: LASIX Take 40 mg by mouth daily. Take additional an 40 mg dose if needed for fluid   hydrOXYzine 25 MG capsule Commonly known as: VISTARIL Take 25 mg by mouth 3 (three) times daily as needed for  itching.   ibandronate 150 MG tablet Commonly known as: BONIVA Take 150 mg every 30 (thirty) days by mouth. Take in the morning with a full glass of water, on an empty stomach, and do not take anything else by mouth or lie down for the next 30 min.   ipratropium-albuterol 0.5-2.5 (3) MG/3ML Soln Commonly known as: DUONEB Take 3 mLs by nebulization every 4 (four) hours as needed (Wheezing).   ketoconazole 2 % shampoo Commonly known as: NIZORAL Apply 1 application topically every 14 (fourteen) days.   mexiletine 150 MG capsule Commonly known as: MEXITIL Take 1 capsule (150 mg total) by mouth 3 (three) times daily.   montelukast 10 MG tablet Commonly known as: SINGULAIR Take 10 mg daily by mouth.   multivitamin with minerals Tabs tablet Take 1 tablet daily by mouth.   nystatin powder Commonly known as: MYCOSTATIN/NYSTOP Apply topically 4 (four) times daily.   nystatin cream Commonly known as: MYCOSTATIN Apply 1 application topically 2 (two) times daily.   pantoprazole 40 MG tablet Commonly known as: PROTONIX Take 40 mg by mouth daily.   polyvinyl alcohol 1.4 % ophthalmic solution Commonly known as: LIQUIFILM TEARS Place 1 drop into both eyes daily as needed for dry eyes.   potassium chloride 10 MEQ tablet Commonly known as: KLOR-CON Take 10 mEq by mouth daily.   Symbicort 160-4.5 MCG/ACT inhaler Generic drug: budesonide-formoterol Inhale 2 puffs into the lungs daily as needed (Wheezing in the winter).   Synthroid 200 MCG tablet Generic drug: levothyroxine Take 200 mcg by mouth daily before breakfast.   traMADol 50 MG tablet Commonly known as: Ultram Take 1 tablet (50 mg total) by mouth daily as needed.   Vitamin D3 25 MCG (1000 UT) Caps Take 2,000 Units by mouth daily.       Diagnostic Studies: DG Shoulder Left Port  Result Date: 10/25/2019 CLINICAL DATA:  Postop left shoulder arthroplasty. EXAM: LEFT SHOULDER COMPARISON:  08/26/2019. FINDINGS: Two views  show verse left shoulder arthroplasty. Components appear grossly well positioned. No radiographically detectable complication. Mild left lower lobe atelectasis or scar. IMPRESSION: Left shoulder reverse total arthroplasty. Electronically Signed   By: Nelson Chimes M.D.   On: 10/25/2019 11:54   XR Shoulder Left  Result Date: 11/09/2019 AP lateral outlet radiographs left shoulder reviewed.  Reverse shoulder prosthesis in good position alignment with no complicating features.  Shoulder is located.  Visualized lung fields clear.   Disposition: Discharge disposition: 01-Home or Self Care       Discharge Instructions    Call MD / Call 911   Complete by: As directed    If you experience chest pain or shortness of breath, CALL 911 and be transported to the hospital emergency room.  If you  develope a fever above 101 F, pus (white drainage) or increased drainage or redness at the wound, or calf pain, call your surgeon's office.   Call MD / Call 911   Complete by: As directed    If you experience chest pain or shortness of breath, CALL 911 and be transported to the hospital emergency room.  If you develope a fever above 101 F, pus (white drainage) or increased drainage or redness at the wound, or calf pain, call your surgeon's office.   Constipation Prevention   Complete by: As directed    Drink plenty of fluids.  Prune juice may be helpful.  You may use a stool softener, such as Colace (over the counter) 100 mg twice a day.  Use MiraLax (over the counter) for constipation as needed.   Constipation Prevention   Complete by: As directed    Drink plenty of fluids.  Prune juice may be helpful.  You may use a stool softener, such as Colace (over the counter) 100 mg twice a day.  Use MiraLax (over the counter) for constipation as needed.   Diet - low sodium heart healthy   Complete by: As directed    Discharge instructions   Complete by: As directed    CPM machine 1 hour 3 times a day No lifting more  than 2 pounds with left arm Okay to shower dressings are waterproof Return to clinic in 10 days   Discharge instructions   Complete by: As directed    You may shower, dressing is waterproof.  Do not bathe or soak the operative shoulder in a tub, pool.  Use the CPM machine 3 times a day for one hour each time.  No lifting with the operative shoulder. Continue use of the sling.  Follow-up with Dr. Marlou Sa in ~2 weeks on your given appointment date.  We will remove your adhesive bandage at that time.   Increase activity slowly as tolerated   Complete by: As directed    Increase activity slowly as tolerated   Complete by: As directed        Follow-up Information    [facility] Follow [facility].   Specialty: Home Health Services Why: home health arranged Contact information: [address] [place] [address] [phone]                Signed: Donella Stade 11/15/2019, 8:08 PM

## 2019-12-05 ENCOUNTER — Encounter: Payer: Self-pay | Admitting: Podiatry

## 2019-12-05 ENCOUNTER — Ambulatory Visit: Payer: Medicare HMO | Admitting: Podiatry

## 2019-12-05 ENCOUNTER — Other Ambulatory Visit: Payer: Self-pay

## 2019-12-05 VITALS — Temp 96.7°F

## 2019-12-05 DIAGNOSIS — L84 Corns and callosities: Secondary | ICD-10-CM | POA: Diagnosis not present

## 2019-12-07 ENCOUNTER — Ambulatory Visit (INDEPENDENT_AMBULATORY_CARE_PROVIDER_SITE_OTHER): Payer: Medicare HMO | Admitting: Orthopedic Surgery

## 2019-12-07 ENCOUNTER — Telehealth: Payer: Self-pay

## 2019-12-07 DIAGNOSIS — Z96612 Presence of left artificial shoulder joint: Secondary | ICD-10-CM

## 2019-12-07 MED ORDER — METHOCARBAMOL 500 MG PO TABS
ORAL_TABLET | ORAL | 0 refills | Status: DC
Start: 2019-12-07 — End: 2020-05-02

## 2019-12-07 MED ORDER — OXYCODONE HCL 5 MG PO TABS: 5.0000 mg | ORAL_TABLET | Freq: Two times a day (BID) | ORAL | 0 refills | Status: DC | PRN

## 2019-12-07 NOTE — Telephone Encounter (Signed)
Patient called in saying [facility] called her about prescription and they I only have 1 prescription for patient . Says they only have muscle relaxer needs her other medication.

## 2019-12-07 NOTE — Progress Notes (Signed)
Subjective:   Patient ID: [name], female   DOB: 74 y.o.   MRN: [id number]   HPI Patient presents stating she has a lot of pain on the outside of her foot and the metatarsal recently   ROS      Objective:  Physical Exam  Neurovascular status unchanged with fixed equinus deformity left ankle with keratotic lesion fifth metatarsal base and fifth metatarsal head     Assessment:  Fixed structural deformity with lesion x2 left     Plan:  Reviewed condition and debrided lesions with no iatrogenic bleeding and patient will be seen back for routine care

## 2019-12-07 NOTE — Telephone Encounter (Signed)
Can you pls send this in today?

## 2019-12-07 NOTE — Telephone Encounter (Signed)
Both RX's were sent in to the same pharmacy at the same time, should be at her pharmacy

## 2019-12-08 ENCOUNTER — Other Ambulatory Visit: Payer: Self-pay | Admitting: Cardiovascular Disease

## 2019-12-10 ENCOUNTER — Encounter: Payer: Self-pay | Admitting: Orthopedic Surgery

## 2019-12-10 NOTE — Progress Notes (Signed)
Post-Op Visit Note   Patient: [name]           Date of Birth: [birth year]           MRN: [id number] Visit Date: 12/07/2019 PCP: System, Pcp Not In   Assessment & Plan:  Chief Complaint:  Chief Complaint  Patient presents with  . Left Shoulder - Routine Post Op   Visit Diagnoses:  1. Status post reverse total replacement of left shoulder     Plan: [name] is a patient who is now 6 weeks out left reverse shoulder replacement.  She states she is doing well with pain relief but still has some decreased range of motion.  She is in home health physical therapy 2 times a week.  She is ambulating with a walker.  Taking muscle relaxer and oxycodone before physical therapy.  Those were refilled today.  On exam she does have decreased forward flexion abduction below 90 but it is improved compared to postop.  Passively her motion is also improved.  I like for her to continue home health physical therapy for 2 more weeks.  6-week return for final clinical recheck.  Follow-Up Instructions: Return in about 6 weeks (around 01/18/2020).   Orders:  No orders of the defined types were placed in this encounter.  Meds ordered this encounter  Medications  . methocarbamol (ROBAXIN) 500 MG tablet    Sig: 1 po q 8hrs prn    Dispense:  30 tablet    Refill:  0  . oxyCODONE (OXY IR/ROXICODONE) 5 MG immediate release tablet    Sig: Take 1 tablet (5 mg total) by mouth every 12 (twelve) hours as needed for moderate pain (pain score 4-6).    Dispense:  30 tablet    Refill:  0    Imaging: No results found.  PMFS History: Patient Active Problem List   Diagnosis Date Noted  . S/P reverse total shoulder arthroplasty, left 10/25/2019  . History of adenomatous polyp of colon 09/30/2018  . Shortness of breath   . ACS (acute coronary syndrome) ([place]) 04/11/2017  . Chest pain, rule out acute myocardial infarction 04/10/2017  . Bigeminy 04/10/2017  . Chronic cystitis 04/09/2017  . Thrombocytopenic  disorder ([place]) 02/25/2017  . Osteoporosis 12/15/2016  . 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 Incomplete emptying of bladder 07/30/2016  . Left foot pain 12/11/2015  . 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 Stopped smoking with greater than 40 pack year history 10/23/2015  . 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 Mixed incontinence urge and stress 09/16/2011  . Cancer of right breast, stage 2 ([place]) 05/28/2011  . 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 Hypothyroid 05/28/2011  . GERD (gastroesophageal reflux disease) 05/28/2011  . Fibromyalgia 05/28/2011  . 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 Mild depression ([place]) 05/28/2011  . Thrombocytopenia, unspecified ([place]) 05/28/2011  . Morbid obesity ([place]) 04/10/2011  . GERD 11/09/2009  . ALLERGIC RHINITIS 08/23/2007  . ASTHMA 08/23/2007  . COUGH 08/23/2007  . SKIN CANCER, HX OF 08/23/2007   Past Medical History:  Diagnosis Date  . ACS (acute coronary syndrome) ([place]) 04/11/2017  . ALLERGIC RHINITIS 08/23/2007   Qualifier: Diagnosis of  By: Doy Mince LPN, Megan    . Anxiety   . Arthritis   . Asthma   . ASTHMA 08/23/2007   Qualifier: Diagnosis of  By: Doy Mince LPN, Megan    . Bigeminy 04/10/2017  .  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 Cancer of right breast, stage 2 ([place]) 05/28/2011   2.8 cm invasive 1 node pos ER pos S/P mastectomy 06/1995 then AC x 4 then Tam X 5   . Chest pain, rule out acute myocardial infarction 04/10/2017  . Chronic constipation   . Chronic cystitis 04/09/2017  . Cough 08/23/2007   Qualifier: Diagnosis of  By: Gwenette Greet MD, Armando Reichert   . Depression   . 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 Dysrhythmia 2020   Had Afib  . 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 Fibromyalgia   . GERD (gastroesophageal reflux disease)     . History of adenomatous polyp of colon 09/30/2018  . 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 History of colon polyps    2007 hyperplagia  . History of esophageal dilatation    2008  . History of peptic ulcer   . History of small bowel obstruction    2012  . Hypothyroid 05/28/2011  . Hypothyroidism   . Incomplete emptying of bladder 07/30/2016  . Left foot pain 12/11/2015   Overview:  Dr Cyril Mourning [facility]  . Macular degeneration of right eye   . Mild depression ([place]) 05/28/2011  . Mixed incontinence urge and stress 09/16/2011   S/p lumax desires surgery   . Morbid obesity ([place]) 04/10/2011  . Numbness of left foot    4 toes  . Osteoporosis 12/15/2016   Overview:  Dr Cletis Media  . Paget's disease of vulva   . Personal history of chemotherapy 1997  . Personal history of radiation therapy 2005  . PONV (postoperative nausea and vomiting)    and HARD TO WAKE  . Seasonal allergies   . Shortness of breath   . SKIN CANCER, HX OF 08/23/2007   Qualifier: Diagnosis of  By: Doy Mince LPN, Megan    . SUI (stress urinary incontinence, female)   . Thrombocytopenic disorder ([place]) 02/25/2017  . Wears glasses     Family History  Problem Relation Age of Onset  . Cancer Father 20       lung and kidney   . Alzheimer's disease Mother   . Heart attack Maternal Grandmother   . Alcohol abuse Brother   . Breast cancer Paternal Aunt   . Breast cancer Cousin   . Breast cancer Cousin     Past Surgical History:  Procedure Laterality Date  . BLADDER SUSPENSION  10/13/2011   Procedure: TRANSVAGINAL TAPE (TVT) PROCEDURE;  Surgeon: Delice Lesch, MD;  Location: [facility];  Service: Gynecology;  Laterality: N/A;  . BREAST BIOPSY Left 03/19/2004  . BREAST LUMPECTOMY Left 03-27-2004  . CARDIAC CATHETERIZATION  10-30-2000  dr Wynonia Lawman   normal coronary arteries and LVF/  ef  65-70%  . CLOSED MANIPULATION  W/ OPEN REDUCTION EXCHANGE TIBIAL FEMORAL BEARING POST TOTAL LEFT KNEE  04-14-2001  . CYSTOSCOPY  10/13/2011   Procedure: CYSTOSCOPY;  Surgeon: Delice Lesch, MD;  Location: [facility];  Service: Gynecology;  Laterality: Bilateral;  . DIAGNOSTIC LAPAROSCOPY     Colon d/t SBO  . DILATION AND CURETTAGE OF UTERUS  1968  . FOOT SURGERY Left 02-01-2009   TRIPLE ARTHRODESIS  . FOOT SURGERY Left 11/14   I&D left foot  with woundvac placement  . INCISIONAL HERNIA REPAIR  05/05/2011   Procedure: LAPAROSCOPIC INCISIONAL HERNIA;  Surgeon: Edward Jolly, MD;  Location: [facility];  Service: General;  Laterality: N/A;  LAPAROSCOPIC REPAIR INCARCERATED INCISIONAL HERNIA  WITH MESH  . KNEE ARTHROSCOPY Bilateral left [id number] & 04-1992/   right [id number]  . LAPAROSCOPIC CHOLECYSTECTOMY  06-21-2010  . LEFT HEART CATH AND CORONARY ANGIOGRAPHY N/A 04/13/2017   Procedure: LEFT HEART CATH AND CORONARY ANGIOGRAPHY;  Surgeon: Jettie Booze, MD;  Location: [facility];  Service: Cardiovascular;  Laterality: N/A;  . LUMBAR SPINE SURGERY  1998   L5 -- S1  . MASTECTOMY Right 1997   W/  LYMPH NODE DISSECTION AND RECONSTRUCTION WITH IMPLANTS AND EXPANDER  . NEGATIVE SLEEP STUDY  12/26/2015  . PORT-A-CATH PLACEMENT AND REMOVAL  1997  . REMOVAL RIGHT BREAST IMPLANT AND CAPSULECTOMY  06-03-1999  . REVERSE SHOULDER ARTHROPLASTY Left 10/25/2019   Procedure: LEFT REVERSE SHOULDER ARTHROPLASTY;  Surgeon: Meredith Pel, MD;  Location: [facility];  Service: Orthopedics;  Laterality: Left;  . REVISION TOTAL KNEE ARTHROPLASTY Left 07-07-2005  &  12-10-2001   12-10-2001  REMOVAL TOTAL KNEE/ I&D / INSERTION ANTIBIOTIC SPACER  . REVISION TOTAL KNEE ARTHROPLASTY Right 07-07-2005  . ROTATOR CUFF REPAIR Right   . TENDON REPAIR RIGHT RING FINGER  2011  . THUMB TRIGGER RELEASE Right 1993  . THYROIDECTOMY, PARTIAL  1983  . TONSILLECTOMY  1968  . TOTAL KNEE ARTHROPLASTY Bilateral right  05-04-2000/  left 03-29-2001  . VULVECTOMY  05/27/2012   Procedure: WIDE EXCISION VULVECTOMY;  Surgeon: Imagene Gurney A. Alycia Rossetti, MD;  Location: [facility];  Service: Gynecology;  Laterality: N/A;  Wide Local Excision of Vulva   . VULVECTOMY N/A 11/23/2012   Procedure: WIDE LOCAL EXCISION OF THE VULVA;  Surgeon: Imagene Gurney A. Alycia Rossetti, MD;  Location: [facility];  Service: Gynecology;  Laterality: N/A;  left inferior vulvar biopsy excision left superior lesion left inferior vulvar excision  . VULVECTOMY N/A 08/02/2013   Procedure: WIDE LOCAL  EXCISION VULVA;  Surgeon: Imagene Gurney A. Alycia Rossetti, MD;  Location: [facility];  Service: Gynecology;  Laterality: N/A;  . VULVECTOMY Left 03/29/2014   Procedure: WIDE LOCAL EXCISION OF LEFT VULVA ;  Surgeon: Imagene Gurney A. Alycia Rossetti, MD;  Location: [facility];  Service: Gynecology;  Laterality: Left;  Marland Kitchen VULVECTOMY Bilateral 01/24/2015   Procedure: WIDE LOCAL EXCISION VULVA;  Surgeon: [name], MD;  Location: [facility];  Service: Gynecology;  Laterality: Bilateral;  . VULVECTOMY PARTIAL  07-16-2010   Social History   Occupational History  . Not on file  Tobacco Use  . Smoking status: Former Smoker    Packs/day: 2.00    Years: 41.00    Pack years: 82.00    Types: Cigarettes    Quit date: 06/05/1999    Years since quitting: 20.5  . Smokeless tobacco: Never Used  Vaping Use  . Vaping Use: Never used  Substance and Sexual Activity  . Alcohol use: No  . Drug use: No  . Sexual activity: Not on file

## 2019-12-14 MED ORDER — FUROSEMIDE 40 MG PO TABS: 40.0000 mg | ORAL_TABLET | Freq: Two times a day (BID) | ORAL | 2 refills | Status: DC

## 2019-12-14 NOTE — Telephone Encounter (Signed)
Yes, please change to 40 mg twice daily

## 2019-12-16 ENCOUNTER — Telehealth: Payer: Self-pay | Admitting: Radiology

## 2019-12-16 ENCOUNTER — Other Ambulatory Visit: Payer: Self-pay

## 2019-12-16 DIAGNOSIS — Z96612 Presence of left artificial shoulder joint: Secondary | ICD-10-CM

## 2019-12-16 NOTE — Telephone Encounter (Signed)
ordered

## 2019-12-16 NOTE — Telephone Encounter (Signed)
Samyra with HHPT states that patient had completed HHPT and is ready to proceed with outpatient. Patient requests [place] [facility] at [facility].

## 2019-12-27 ENCOUNTER — Encounter: Payer: Self-pay | Admitting: Physical Therapy

## 2019-12-27 ENCOUNTER — Ambulatory Visit: Payer: Medicare HMO | Attending: Orthopedic Surgery | Admitting: Physical Therapy

## 2019-12-27 ENCOUNTER — Other Ambulatory Visit: Payer: Self-pay

## 2019-12-27 DIAGNOSIS — M25612 Stiffness of left shoulder, not elsewhere classified: Secondary | ICD-10-CM | POA: Diagnosis present

## 2019-12-27 DIAGNOSIS — M6281 Muscle weakness (generalized): Secondary | ICD-10-CM | POA: Insufficient documentation

## 2019-12-27 DIAGNOSIS — M25512 Pain in left shoulder: Secondary | ICD-10-CM | POA: Diagnosis present

## 2019-12-27 DIAGNOSIS — R600 Localized edema: Secondary | ICD-10-CM

## 2019-12-27 NOTE — Therapy (Signed)
[facility] [address], [place], [place], [address] Phone: [phone]   Fax:  [phone]  Physical Therapy Evaluation  Patient Details  Name: [name] MRN: [id number] Date of Birth: April 03, 1946 Referring Provider (PT): Dr. Marlou Sa    Encounter Date: 12/27/2019   PT End of Session - 12/27/19 1828    Visit Number 1    Date for PT Re-Evaluation 02/21/20    Authorization Type [employer]    PT Start Time 1238   pt late   PT Stop Time 1320    PT Time Calculation (min) 42 min    Activity Tolerance Patient tolerated treatment well           Past Medical History:  Diagnosis Date  . ACS (acute coronary syndrome) ([place]) 04/11/2017  . ALLERGIC RHINITIS 08/23/2007   Qualifier: Diagnosis of  By: Doy Mince LPN, Megan    . Anxiety   . Arthritis   . Asthma   . ASTHMA 08/23/2007   Qualifier: Diagnosis of  By: Doy Mince LPN, Megan    . Bigeminy 04/10/2017  . 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 Cancer of right breast, stage 2 ([place]) 05/28/2011   2.8 cm invasive 1 node pos ER pos S/P mastectomy 06/1995 then AC x 4 then Tam X 5   . Chest pain, rule out acute myocardial infarction 04/10/2017  . Chronic constipation   . Chronic cystitis 04/09/2017  . Cough 08/23/2007   Qualifier: Diagnosis of  By: Gwenette Greet MD, Armando Reichert   . Depression   . 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 Dysrhythmia 2020   Had Afib  . 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 Fibromyalgia   . GERD (gastroesophageal reflux disease)   . History of adenomatous polyp of colon 09/30/2018  . 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 History of colon polyps    2007 hyperplagia  . History of esophageal dilatation     2008  . History of peptic ulcer   . History of small bowel obstruction    2012  . Hypothyroid 05/28/2011  . Hypothyroidism   . Incomplete emptying of bladder 07/30/2016  . Left foot pain 12/11/2015   Overview:  Dr Cyril Mourning [facility]  . Macular degeneration of right eye   . Mild depression ([place]) 05/28/2011  . Mixed incontinence urge and stress 09/16/2011   S/p lumax desires surgery   . Morbid obesity ([place]) 04/10/2011  . Numbness of left foot    4 toes  . Osteoporosis 12/15/2016   Overview:  Dr Cletis Media  . Paget's disease of vulva   . Personal history of chemotherapy 1997  . Personal history of radiation therapy 2005  . PONV (postoperative nausea and vomiting)    and HARD TO WAKE  . Seasonal allergies   . Shortness of breath   . SKIN CANCER, HX OF 08/23/2007   Qualifier: Diagnosis of  By: Doy Mince LPN, Megan    . SUI (stress urinary incontinence, female)   . Thrombocytopenic disorder ([place]) 02/25/2017  . Wears glasses     Past Surgical History:  Procedure Laterality Date  . BLADDER SUSPENSION  10/13/2011   Procedure: TRANSVAGINAL TAPE (TVT) PROCEDURE;  Surgeon: Delice Lesch, MD;  Location: [facility];  Service: Gynecology;  Laterality: N/A;  . BREAST BIOPSY Left 03/19/2004  . BREAST LUMPECTOMY Left 03-27-2004  . CARDIAC CATHETERIZATION  10-30-2000  dr Wynonia Lawman   normal coronary arteries and LVF/  ef 65-70%  . CLOSED MANIPULATION  W/ OPEN REDUCTION EXCHANGE TIBIAL FEMORAL BEARING POST TOTAL LEFT KNEE  04-14-2001  . CYSTOSCOPY  10/13/2011   Procedure: CYSTOSCOPY;  Surgeon: Delice Lesch, MD;  Location: [facility];  Service: Gynecology;  Laterality: Bilateral;  . DIAGNOSTIC LAPAROSCOPY     Colon d/t SBO  . DILATION AND CURETTAGE OF UTERUS  1968  . FOOT SURGERY Left 02-01-2009   TRIPLE ARTHRODESIS  . FOOT SURGERY Left 11/14   I&D left foot with woundvac placement  . INCISIONAL HERNIA REPAIR  05/05/2011   Procedure: LAPAROSCOPIC INCISIONAL HERNIA;  Surgeon: Edward Jolly, MD;   Location: [facility];  Service: General;  Laterality: N/A;  LAPAROSCOPIC REPAIR INCARCERATED INCISIONAL HERNIA  WITH MESH  . KNEE ARTHROSCOPY Bilateral left 34-1962 & 04-1992/   right [phone]  . LAPAROSCOPIC CHOLECYSTECTOMY  06-21-2010  . LEFT HEART CATH AND CORONARY ANGIOGRAPHY N/A 04/13/2017   Procedure: LEFT HEART CATH AND CORONARY ANGIOGRAPHY;  Surgeon: Jettie Booze, MD;  Location: [facility];  Service: Cardiovascular;  Laterality: N/A;  . LUMBAR SPINE SURGERY  1998   L5 -- S1  . MASTECTOMY Right 1997   W/  LYMPH NODE DISSECTION AND RECONSTRUCTION WITH IMPLANTS AND EXPANDER  . NEGATIVE SLEEP STUDY  12/26/2015  . PORT-A-CATH PLACEMENT AND REMOVAL  1997  . REMOVAL RIGHT BREAST IMPLANT AND CAPSULECTOMY  06-03-1999  . REVERSE SHOULDER ARTHROPLASTY Left 10/25/2019   Procedure: LEFT REVERSE SHOULDER ARTHROPLASTY;  Surgeon: Meredith Pel, MD;  Location: [facility];  Service: Orthopedics;  Laterality: Left;  . REVISION TOTAL KNEE ARTHROPLASTY Left 07-07-2005  &  12-10-2001   12-10-2001  REMOVAL TOTAL KNEE/ I&D / INSERTION ANTIBIOTIC SPACER  . REVISION TOTAL KNEE ARTHROPLASTY Right 07-07-2005  . ROTATOR CUFF REPAIR Right   . TENDON REPAIR RIGHT RING FINGER  2011  . THUMB TRIGGER RELEASE Right 1993  . THYROIDECTOMY, PARTIAL  1983  . TONSILLECTOMY  1968  . TOTAL KNEE ARTHROPLASTY Bilateral right 05-04-2000/  left 03-29-2001  . VULVECTOMY  05/27/2012   Procedure: WIDE EXCISION VULVECTOMY;  Surgeon: Imagene Gurney A. Alycia Rossetti, MD;  Location: [facility];  Service: Gynecology;  Laterality: N/A;  Wide Local Excision of Vulva   . VULVECTOMY N/A 11/23/2012   Procedure: WIDE LOCAL EXCISION OF THE VULVA;  Surgeon: Imagene Gurney A. Alycia Rossetti, MD;  Location: [facility];  Service: Gynecology;  Laterality: N/A;  left inferior vulvar biopsy excision left superior lesion left inferior vulvar excision  . VULVECTOMY N/A 08/02/2013   Procedure: WIDE LOCAL  EXCISION VULVA;  Surgeon: Imagene Gurney A. Alycia Rossetti, MD;  Location: [facility];  Service:  Gynecology;  Laterality: N/A;  . VULVECTOMY Left 03/29/2014   Procedure: WIDE LOCAL EXCISION OF LEFT VULVA ;  Surgeon: Imagene Gurney A. Alycia Rossetti, MD;  Location: [facility];  Service: Gynecology;  Laterality: Left;  Marland Kitchen VULVECTOMY Bilateral 01/24/2015   Procedure: WIDE LOCAL EXCISION VULVA;  Surgeon: [name], MD;  Location: [facility];  Service: Gynecology;  Laterality: Bilateral;  . VULVECTOMY PARTIAL  07-16-2010    There were no vitals filed for this visit.    Subjective Assessment - 12/27/19 1242    Subjective hx of falls with torn rotator cuff;  5 days in hospital following left TSR 6/1;  HHPT  climbing wall; pulleys; Reports these are "very painful".  Takes a pain pill before every HHPT.   Has abductor pillow; got up to 115 with therapist assist;  couldn't lift frying pan or casserole    Pertinent History "[place]";  Left total shoulder replacement 10/25/19;  TKR had multiple infections; right hand dominant;  MD 8/30;  her husband had previous PT at this clinic with [facility] hold activities;Lifting    Patient Stated Goals raise arm up to put dishes away; lift tea pitcher into fridge    Currently in Pain? Yes    Pain Score 5     Pain Location Shoulder    Pain Orientation Left    Pain Type Surgical pain    Pain Onset More than a month ago    Pain Frequency Constant    Aggravating Factors  wall climbs; stretch across bar    Pain Relieving Factors ice              [facility] PT Assessment - 12/27/19 0001      Assessment   Medical Diagnosis left reverse total shoulder replacement     Referring Provider (PT) Dr. Marlou Sa     Onset Date/Surgical Date 10/25/19    Hand Dominance Right    Next MD Visit 01/23/20    Prior Therapy after TKR       Precautions   Precautions Fall    Precaution Comments no lift >5-10#      Restrictions   Weight Bearing Restrictions No      Balance Screen   Has the patient fallen in the past 6 months Yes    How many  times? 1   February   Has the patient had a decrease in activity level because of a fear of falling?  No    Is the patient reluctant to leave their home because of a fear of falling?  No      Home Ecologist residence    Living Arrangements Spouse/significant other    Additional Comments currently using a SPC but states she uses a walker at home       Prior Function   Level of Independence Independent with household mobility with device    Vocation Retired    Hospital doctor; cooking       Observation/Other Assessments   Focus on Therapeutic Outcomes (FOTO)  73% limitation       AROM   Overall AROM Comments able to do a scapular squeeze     Right Shoulder Flexion 136 Degrees    Right Shoulder ABduction 155 Degrees    Left Shoulder Flexion 60 Degrees   70 assisted seated   Left Shoulder ABduction 46 Degrees    Left Shoulder Internal Rotation --   lateral left hip    Left Shoulder External Rotation 0 Degrees      PROM   Overall PROM Comments in supine propped on wedge with cane and clasped hand assist 100 degrees       Strength   Overall Strength Comments unable to hold UE in elevated positions in sitting or supine but can hold with cane assist 20 sec    Left Shoulder Flexion 1/5    Left Shoulder Extension 3/5    Left Shoulder ABduction 1/5    Left Shoulder Internal Rotation 1/5    Left Shoulder External Rotation 1/5    Left Elbow Flexion 3+/5    Left Elbow Extension 2+/5  Objective measurements completed on examination: See above findings.               PT Education - 12/27/19 1437    Education Details clasped hand reclined elevation; cane assisted reclined elevation and holding overhead    Person(s) Educated Patient    Methods Explanation;Demonstration;Handout    Comprehension Returned demonstration;Verbalized understanding            PT Short Term Goals - 12/27/19 1905      PT SHORT TERM  GOAL #1   Title The patient will demonstrate regular compliance of initial ROM and strengthing HEP    Time 4    Period Weeks    Status New    Target Date 01/24/20      PT SHORT TERM GOAL #2   Title The patient will report a 30% improvement in functional use of left UE to turn faucets on/off, flush the toilet and reach opposite side to put on deodorant    Time 4    Period Weeks    Status New      PT SHORT TERM GOAL #3   Title Left shoulder active assisted elevation to 125 degrees in semi-reclined position    Time 4    Period Weeks    Status New             PT Long Term Goals - 12/27/19 1911      PT LONG TERM GOAL #1   Title The patient will be independent in safe self progression of HEP    Time 8    Period Weeks    Status New    Target Date 02/21/20      PT LONG TERM GOAL #2   Title The patient will report a 50% improvement in functional use of left UE and pain level for light cooking, grooming and dressing tasks    Time 8    Period Weeks    Status New      PT LONG TERM GOAL #3   Title The patient will 100 degrees of seated or standing elevation needed for reaching into lower kitchen cabinets    Time 8    Period Weeks    Status New      PT LONG TERM GOAL #4   Title The patient will have improved left shoulder strength and elbow strength to 3+/5 needed to lift the tea pitcher into the lower shelf of the fridge (with 2 hands)    Time 8    Period Weeks    Status New      PT LONG TERM GOAL #5   Title FOTO functional outcome score improved from 73% limitation to 40% indicating improved function and pain    Time 8    Period Weeks    Status New                  Plan - 12/27/19 1438    Clinical Impression Statement The patient reports a 3 year history of left shoulder pain and limited ROM and strength which she attributes to multiple falls causing a rotator cuff tear.  She underwent a reverse total shoulder replacement on 10/25/19.  She stayed in the hospital  5 days for antibiotics secondary to her previous history of left TKR infection.  She has had an unremarkable recovery from her shoulder replacement and has had HHPT.  She continues to have significant limitations in ROM in both gravity assisted and against gravity positions and the inability  to use her left UE functionally.  She reports she does not use her left arm for turning the faucets, grooming/dressing task or with cooking.  In sitting, she has 60 degrees of elevation with major compensatory patterns.  Much improved biomechanics in supine elevated on wedge (unable to lie flat secondary to asthma) with active assisted elevation to 100 degrees.  With assistance she is able to hold the position 20 sec.  She reports raising her arm in this way is much less painful than pulleys and wall climbs.  Shoulder strength grossly 1/5.  Decreased biceps and triceps strength.   She would benefit from PT to improve ROM and strength needed for function.    Personal Factors and Comorbidities Age;Fitness;Comorbidity 1;Comorbidity 2;Comorbidity 3+    Comorbidities osteoporosis; asthma; fibromyalgia, depression; cardiac; left LE multiple knee surgeries related to infection    Examination-Activity Limitations Bathing;Reach Overhead;Carry;Lift;Hygiene/Grooming;Dressing;Toileting    Examination-Participation Restrictions Meal Prep;Cleaning;Shop;Laundry    Stability/Clinical Decision Making Evolving/Moderate complexity    Clinical Decision Making Moderate    Rehab Potential Good    PT Frequency 2x / week    PT Duration 8 weeks    PT Treatment/Interventions ADLs/Self Care Home Management;Cryotherapy;Electrical Stimulation;Ultrasound;Moist Heat;Therapeutic exercise;Therapeutic activities;Patient/family education;Manual techniques;Taping;Vasopneumatic Device;Neuromuscular re-education    PT Next Visit Plan supine on wedge active assisted ROM; rythmic stabilization (while holding cane if needed);  try seated yellow band rows and  extensions; biceps/triceps strengthening; low level UE Ranger; ice pack or Game Ready vasocompression    PT Home Exercise Plan [email or web address]    Consulted and Agree with Plan of Care Patient           Patient will benefit from skilled therapeutic intervention in order to improve the following deficits and impairments:  Pain, Impaired UE functional use, Decreased range of motion, Decreased strength, Impaired perceived functional ability  Visit Diagnosis: Stiffness of left shoulder, not elsewhere classified - Plan: PT plan of care cert/re-cert, CANCELED: PT plan of care cert/re-cert  Acute pain of left shoulder - Plan: PT plan of care cert/re-cert, CANCELED: PT plan of care cert/re-cert  Muscle weakness (generalized) - Plan: PT plan of care cert/re-cert, CANCELED: PT plan of care cert/re-cert  Localized edema - Plan: PT plan of care cert/re-cert, CANCELED: PT plan of care cert/re-cert     Problem List Patient Active Problem List   Diagnosis Date Noted  . S/P reverse total shoulder arthroplasty, left 10/25/2019  . History of adenomatous polyp of colon 09/30/2018  . Shortness of breath   . ACS (acute coronary syndrome) ([place]) 04/11/2017  . Chest pain, rule out acute myocardial infarction 04/10/2017  . Bigeminy 04/10/2017  . Chronic cystitis 04/09/2017  . Thrombocytopenic disorder ([place]) 02/25/2017  . Osteoporosis 12/15/2016  . 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 Incomplete emptying of bladder 07/30/2016  . Left foot pain 12/11/2015  . 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 Stopped smoking with greater than 40 pack year history 10/23/2015  . 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 Mixed incontinence urge and stress 09/16/2011  . Cancer of right breast, stage 2 ([place]) 05/28/2011  . 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 Hypothyroid 05/28/2011  . GERD (gastroesophageal reflux disease) 05/28/2011  . Fibromyalgia  05/28/2011  . 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 Mild depression ([place]) 05/28/2011  . Thrombocytopenia, unspecified ([place]) 05/28/2011  . Morbid obesity ([place]) 04/10/2011  . GERD 11/09/2009  . ALLERGIC RHINITIS 08/23/2007  . ASTHMA  08/23/2007  . COUGH 08/23/2007  . SKIN CANCER, HX OF 08/23/2007    Ruben Im, PT 12/27/19 7:33 PM Phone: [phone] Fax: [phone] Alvera Singh 12/27/2019, 7:33 PM  [facility] [address], [place], [place], [address] Phone: [phone]   Fax:  [phone]  Name: [name] MRN: [id number] Date of Birth: 1946/01/29

## 2019-12-27 NOTE — Patient Instructions (Signed)
Access Code: [email or web address] URL: [email or web address] Date: 12/27/2019 Prepared by: Ruben Im  Exercises Supine Shoulder Flexion Extension AAROM with Dowel - 3 x daily - 7 x weekly - 1 sets - 5 reps - 20 hold

## 2019-12-28 ENCOUNTER — Encounter: Payer: Self-pay | Admitting: Physical Therapy

## 2019-12-28 ENCOUNTER — Ambulatory Visit: Payer: Medicare HMO | Admitting: Physical Therapy

## 2019-12-28 ENCOUNTER — Other Ambulatory Visit: Payer: Self-pay

## 2019-12-28 DIAGNOSIS — M25512 Pain in left shoulder: Secondary | ICD-10-CM

## 2019-12-28 DIAGNOSIS — M25612 Stiffness of left shoulder, not elsewhere classified: Secondary | ICD-10-CM | POA: Diagnosis not present

## 2019-12-28 DIAGNOSIS — M6281 Muscle weakness (generalized): Secondary | ICD-10-CM

## 2019-12-28 NOTE — Therapy (Signed)
[facility] [address] [place], [place], [address] Phone: [phone]   Fax:  [phone]  Physical Therapy Treatment  Patient Details  Name: [name] MRN: [id number] Date of Birth: February 10, 1946 Referring Provider (PT): Dr. Marlou Sa    Encounter Date: 12/28/2019   PT End of Session - 12/28/19 1144    Visit Number 2    Date for PT Re-Evaluation 02/21/20    Authorization Type [employer]    PT Start Time 1100    PT Stop Time 1155    PT Time Calculation (min) 55 min    Activity Tolerance Patient tolerated treatment well    Behavior During Therapy [facility] for tasks assessed/performed           Past Medical History:  Diagnosis Date  . ACS (acute coronary syndrome) ([place]) 04/11/2017  . ALLERGIC RHINITIS 08/23/2007   Qualifier: Diagnosis of  By: Doy Mince LPN, Megan    . Anxiety   . Arthritis   . Asthma   . ASTHMA 08/23/2007   Qualifier: Diagnosis of  By: Doy Mince LPN, Megan    . Bigeminy 04/10/2017  . 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 Cancer of right breast, stage 2 ([place]) 05/28/2011   2.8 cm invasive 1 node pos ER pos S/P mastectomy 06/1995 then AC x 4 then Tam X 5   . Chest pain, rule out acute myocardial infarction 04/10/2017  . Chronic constipation   . Chronic cystitis 04/09/2017  . Cough 08/23/2007   Qualifier: Diagnosis of  By: Gwenette Greet MD, Armando Reichert   . Depression   . 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 Dysrhythmia 2020   Had Afib  . 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 Fibromyalgia   . GERD (gastroesophageal reflux disease)   . History of adenomatous polyp of colon 09/30/2018  . 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 History of colon polyps     2007 hyperplagia  . History of esophageal dilatation    2008  . History of peptic ulcer   . History of small bowel obstruction    2012  . Hypothyroid 05/28/2011  . Hypothyroidism   . Incomplete emptying of bladder 07/30/2016  . Left foot pain 12/11/2015   Overview:  Dr Cyril Mourning [facility]  . Macular degeneration of right eye   . Mild depression ([place]) 05/28/2011  . Mixed incontinence urge and stress 09/16/2011   S/p lumax desires surgery   . Morbid obesity ([place]) 04/10/2011  . Numbness of left foot    4 toes  . Osteoporosis 12/15/2016   Overview:  Dr Cletis Media  . Paget's disease of vulva   . Personal history of chemotherapy 1997  . Personal history of radiation therapy 2005  . PONV (postoperative nausea and vomiting)    and HARD TO WAKE  . Seasonal allergies   . Shortness of breath   . SKIN CANCER, HX OF 08/23/2007   Qualifier: Diagnosis of  By: Doy Mince LPN, Megan    . SUI (stress urinary incontinence, female)   . Thrombocytopenic disorder ([place]) 02/25/2017  . Wears glasses     Past Surgical History:  Procedure Laterality Date  . BLADDER SUSPENSION  10/13/2011   Procedure: TRANSVAGINAL TAPE (TVT)  PROCEDURE;  Surgeon: Delice Lesch, MD;  Location: [facility];  Service: Gynecology;  Laterality: N/A;  . BREAST BIOPSY Left 03/19/2004  . BREAST LUMPECTOMY Left 03-27-2004  . CARDIAC CATHETERIZATION  10-30-2000  dr Wynonia Lawman   normal coronary arteries and LVF/  ef 65-70%  . CLOSED MANIPULATION  W/ OPEN REDUCTION EXCHANGE TIBIAL FEMORAL BEARING POST TOTAL LEFT KNEE  04-14-2001  . CYSTOSCOPY  10/13/2011   Procedure: CYSTOSCOPY;  Surgeon: Delice Lesch, MD;  Location: [facility];  Service: Gynecology;  Laterality: Bilateral;  . DIAGNOSTIC LAPAROSCOPY     Colon d/t SBO  . DILATION AND CURETTAGE OF UTERUS  1968  . FOOT SURGERY Left 02-01-2009   TRIPLE ARTHRODESIS  . FOOT SURGERY Left 11/14   I&D left foot with woundvac placement  . INCISIONAL HERNIA REPAIR  05/05/2011   Procedure: LAPAROSCOPIC  INCISIONAL HERNIA;  Surgeon: Edward Jolly, MD;  Location: [facility];  Service: General;  Laterality: N/A;  LAPAROSCOPIC REPAIR INCARCERATED INCISIONAL HERNIA  WITH MESH  . KNEE ARTHROSCOPY Bilateral left [phone] & 04-1992/   right [phone]  . LAPAROSCOPIC CHOLECYSTECTOMY  06-21-2010  . LEFT HEART CATH AND CORONARY ANGIOGRAPHY N/A 04/13/2017   Procedure: LEFT HEART CATH AND CORONARY ANGIOGRAPHY;  Surgeon: Jettie Booze, MD;  Location: [facility];  Service: Cardiovascular;  Laterality: N/A;  . LUMBAR SPINE SURGERY  1998   L5 -- S1  . MASTECTOMY Right 1997   W/  LYMPH NODE DISSECTION AND RECONSTRUCTION WITH IMPLANTS AND EXPANDER  . NEGATIVE SLEEP STUDY  12/26/2015  . PORT-A-CATH PLACEMENT AND REMOVAL  1997  . REMOVAL RIGHT BREAST IMPLANT AND CAPSULECTOMY  06-03-1999  . REVERSE SHOULDER ARTHROPLASTY Left 10/25/2019   Procedure: LEFT REVERSE SHOULDER ARTHROPLASTY;  Surgeon: Meredith Pel, MD;  Location: [facility];  Service: Orthopedics;  Laterality: Left;  . REVISION TOTAL KNEE ARTHROPLASTY Left 07-07-2005  &  12-10-2001   12-10-2001  REMOVAL TOTAL KNEE/ I&D / INSERTION ANTIBIOTIC SPACER  . REVISION TOTAL KNEE ARTHROPLASTY Right 07-07-2005  . ROTATOR CUFF REPAIR Right   . TENDON REPAIR RIGHT RING FINGER  2011  . THUMB TRIGGER RELEASE Right 1993  . THYROIDECTOMY, PARTIAL  1983  . TONSILLECTOMY  1968  . TOTAL KNEE ARTHROPLASTY Bilateral right 05-04-2000/  left 03-29-2001  . VULVECTOMY  05/27/2012   Procedure: WIDE EXCISION VULVECTOMY;  Surgeon: Imagene Gurney A. Alycia Rossetti, MD;  Location: [facility];  Service: Gynecology;  Laterality: N/A;  Wide Local Excision of Vulva   . VULVECTOMY N/A 11/23/2012   Procedure: WIDE LOCAL EXCISION OF THE VULVA;  Surgeon: Imagene Gurney A. Alycia Rossetti, MD;  Location: [facility];  Service: Gynecology;  Laterality: N/A;  left inferior vulvar biopsy excision left superior lesion left inferior vulvar excision  . VULVECTOMY N/A 08/02/2013   Procedure: WIDE LOCAL  EXCISION VULVA;   Surgeon: Imagene Gurney A. Alycia Rossetti, MD;  Location: [facility];  Service: Gynecology;  Laterality: N/A;  . VULVECTOMY Left 03/29/2014   Procedure: WIDE LOCAL EXCISION OF LEFT VULVA ;  Surgeon: Imagene Gurney A. Alycia Rossetti, MD;  Location: [facility];  Service: Gynecology;  Laterality: Left;  Marland Kitchen VULVECTOMY Bilateral 01/24/2015   Procedure: WIDE LOCAL EXCISION VULVA;  Surgeon: [name], MD;  Location: [facility];  Service: Gynecology;  Laterality: Bilateral;  . VULVECTOMY PARTIAL  07-16-2010    There were no vitals filed for this visit.   Subjective Assessment - 12/28/19 1059    Subjective Took pain pill before coming, pain not too bad this AM.  Pertinent History "Dajia";  Left total shoulder replacement 10/25/19;  TKR had multiple infections; right hand dominant;  MD 8/30;  her husband had previous PT at this clinic with [facility]    Currently in Pain? Yes    Pain Score 3     Pain Location Shoulder    Pain Orientation Left    Pain Descriptors / Indicators Dull;Aching                             [facility] Adult PT Treatment/Exercise - 12/28/19 0001      Shoulder Exercises: Supine   Other Supine Exercises Cane AROM exercises eclined back 2x10 of each plane      Shoulder Exercises: Seated   Row Strengthening;Both;20 reps;Theraband    Theraband Level (Shoulder Row) Level 1 (Yellow)    Other Seated Exercises UE ranger all planes 2x10, TC inhibit upper trap      Shoulder Exercises: Standing   Other Standing Exercises Finger ladder 3x reaches # 9, TC/support       Other Standing Exercises 5 cones to 1st shelf 2x with mod asst from pTA      Shoulder Exercises: Pulleys   Flexion --   20x VC when to stop before hiking shoulder too much     Cryotherapy   Number Minutes Cryotherapy 10 Minutes    Cryotherapy Location Shoulder    Type of Cryotherapy Ice pack      Manual Therapy   Manual Therapy Soft tissue mobilization    Soft tissue mobilization Lt upper trap and upper  arm in semi reclined post cane work                     PT Short Term Goals - 12/27/19 1905      PT SHORT TERM GOAL #1   Title The patient will demonstrate regular compliance of initial ROM and strengthing HEP    Time 4    Period Weeks    Status New    Target Date 01/24/20      PT SHORT TERM GOAL #2   Title The patient will report a 30% improvement in functional use of left UE to turn faucets on/off, flush the toilet and reach opposite side to put on deodorant    Time 4    Period Weeks    Status New      PT SHORT TERM GOAL #3   Title Left shoulder active assisted elevation to 125 degrees in semi-reclined position    Time 4    Period Weeks    Status New             PT Long Term Goals - 12/27/19 1911      PT LONG TERM GOAL #1   Title The patient will be independent in safe self progression of HEP    Time 8    Period Weeks    Status New    Target Date 02/21/20      PT LONG TERM GOAL #2   Title The patient will report a 50% improvement in functional use of left UE and pain level for light cooking, grooming and dressing tasks    Time 8    Period Weeks    Status New      PT LONG TERM GOAL #3   Title The patient will 100 degrees of seated or standing elevation needed for reaching into lower kitchen cabinets    Time  8    Period Weeks    Status New      PT LONG TERM GOAL #4   Title The patient will have improved left shoulder strength and elbow strength to 3+/5 needed to lift the tea pitcher into the lower shelf of the fridge (with 2 hands)    Time 8    Period Weeks    Status New      PT LONG TERM GOAL #5   Title FOTO functional outcome score improved from 73% limitation to 40% indicating improved function and pain    Time 8    Period Weeks    Status New                 Plan - 12/28/19 1450    Clinical Impression Statement Pt arrives with reports of her shoulder feeling better, just a little soreness to start. Pt was able to do mostly  semireclined AAROM exercises withthe cane in all planes; no real pain just fatigue and soreness from use. Pt attempted the finger ladder, reaching level 9 with fair mechanics. Pt requires moderate assistance to put a cone on the first shelf. Pt required both verbal and tactile cuing to inhibit upper trap.    Personal Factors and Comorbidities Age;Fitness;Comorbidity 1;Comorbidity 2;Comorbidity 3+    Comorbidities osteoporosis; asthma; fibromyalgia, depression; cardiac; left LE multiple knee surgeries related to infection    Examination-Activity Limitations Bathing;Reach Overhead;Carry;Lift;Hygiene/Grooming;Dressing;Toileting    Examination-Participation Restrictions Meal Prep;Cleaning;Shop;Laundry    Stability/Clinical Decision Making Evolving/Moderate complexity    Rehab Potential Good    PT Frequency 2x / week    PT Duration 8 weeks    PT Treatment/Interventions ADLs/Self Care Home Management;Cryotherapy;Electrical Stimulation;Ultrasound;Moist Heat;Therapeutic exercise;Therapeutic activities;Patient/family education;Manual techniques;Taping;Vasopneumatic Device;Neuromuscular re-education    PT Next Visit Plan supine on wedge active assisted ROM; rythmic stabilization (while holding cane if needed);  try seated yellow band rows and extensions; biceps/triceps strengthening; low level UE Ranger; ice pack or Game Ready vasocompression    PT Home Exercise Plan [id number]    Consulted and Agree with Plan of Care Patient           Patient will benefit from skilled therapeutic intervention in order to improve the following deficits and impairments:  Pain, Impaired UE functional use, Decreased range of motion, Decreased strength, Impaired perceived functional ability  Visit Diagnosis: No diagnosis found.     Problem List Patient Active Problem List   Diagnosis Date Noted  . S/P reverse total shoulder arthroplasty, left 10/25/2019  . History of adenomatous polyp of colon 09/30/2018  . Shortness  of breath   . ACS (acute coronary syndrome) ([place]) 04/11/2017  . Chest pain, rule out acute myocardial infarction 04/10/2017  . Bigeminy 04/10/2017  . Chronic cystitis 04/09/2017  . Thrombocytopenic disorder ([place]) 02/25/2017  . Osteoporosis 12/15/2016  . 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 Incomplete emptying of bladder 07/30/2016  . Left foot pain 12/11/2015  . 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 Stopped smoking with greater than 40 pack year history 10/23/2015  . 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 Mixed incontinence urge and stress 09/16/2011  . Cancer of right breast, stage 2 ([place]) 05/28/2011  . 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 Hypothyroid 05/28/2011  . GERD (gastroesophageal reflux disease) 05/28/2011  . Fibromyalgia 05/28/2011  . 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 Mild depression ([place]) 05/28/2011  . Thrombocytopenia, unspecified ([place]) 05/28/2011  .  Morbid obesity ([place]) 04/10/2011  . GERD 11/09/2009  . ALLERGIC RHINITIS 08/23/2007  . ASTHMA 08/23/2007  . COUGH 08/23/2007  . SKIN CANCER, HX OF 08/23/2007    Alija Riano, PTA 12/28/2019, 2:55 PM  [facility] [address], [place], [place], [address] Phone: [phone]   Fax:  [phone]  Name: TAYNA SMETHURST MRN: [id number] Date of Birth: 24-Jan-1946

## 2019-12-30 ENCOUNTER — Ambulatory Visit: Payer: Medicare HMO | Admitting: Physical Therapy

## 2020-01-02 ENCOUNTER — Ambulatory Visit: Payer: Medicare HMO | Admitting: Physical Therapy

## 2020-01-02 ENCOUNTER — Other Ambulatory Visit: Payer: Self-pay

## 2020-01-02 DIAGNOSIS — M25612 Stiffness of left shoulder, not elsewhere classified: Secondary | ICD-10-CM | POA: Diagnosis not present

## 2020-01-02 DIAGNOSIS — M6281 Muscle weakness (generalized): Secondary | ICD-10-CM

## 2020-01-02 DIAGNOSIS — M25512 Pain in left shoulder: Secondary | ICD-10-CM

## 2020-01-02 DIAGNOSIS — R600 Localized edema: Secondary | ICD-10-CM

## 2020-01-02 NOTE — Therapy (Signed)
[facility] [address] [place], [place], [address] Phone: [phone]   Fax:  [phone]  Physical Therapy Treatment  Patient Details  Name: [name] MRN: [id number] Date of Birth: 1945/06/17 Referring Provider (PT): Dr. Marlou Sa    Encounter Date: 01/02/2020   PT End of Session - 01/02/20 1116    Visit Number 3    Date for PT Re-Evaluation 02/21/20    Authorization Type [employer]    PT Start Time 1106    PT Stop Time 1155    PT Time Calculation (min) 49 min    Activity Tolerance Patient tolerated treatment well    Behavior During Therapy [facility] for tasks assessed/performed           Past Medical History:  Diagnosis Date  . ACS (acute coronary syndrome) ([place]) 04/11/2017  . ALLERGIC RHINITIS 08/23/2007   Qualifier: Diagnosis of  By: Doy Mince LPN, Megan    . Anxiety   . Arthritis   . Asthma   . ASTHMA 08/23/2007   Qualifier: Diagnosis of  By: Doy Mince LPN, Megan    . Bigeminy 04/10/2017  . 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 Cancer of right breast, stage 2 ([place]) 05/28/2011   2.8 cm invasive 1 node pos ER pos S/P mastectomy 06/1995 then AC x 4 then Tam X 5   . Chest pain, rule out acute myocardial infarction 04/10/2017  . Chronic constipation   . Chronic cystitis 04/09/2017  . Cough 08/23/2007   Qualifier: Diagnosis of  By: Gwenette Greet MD, Armando Reichert   . Depression   . 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 Dysrhythmia 2020   Had Afib  . 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 Fibromyalgia   . GERD (gastroesophageal reflux disease)   . History of adenomatous polyp of colon 09/30/2018  . 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 History of colon polyps     2007 hyperplagia  . History of esophageal dilatation    2008  . History of peptic ulcer   . History of small bowel obstruction    2012  . Hypothyroid 05/28/2011  . Hypothyroidism   . Incomplete emptying of bladder 07/30/2016  . Left foot pain 12/11/2015   Overview:  Dr Cyril Mourning [facility]  . Macular degeneration of right eye   . Mild depression ([place]) 05/28/2011  . Mixed incontinence urge and stress 09/16/2011   S/p lumax desires surgery   . Morbid obesity ([place]) 04/10/2011  . Numbness of left foot    4 toes  . Osteoporosis 12/15/2016   Overview:  Dr Cletis Media  . Paget's disease of vulva   . Personal history of chemotherapy 1997  . Personal history of radiation therapy 2005  . PONV (postoperative nausea and vomiting)    and HARD TO WAKE  . Seasonal allergies   . Shortness of breath   . SKIN CANCER, HX OF 08/23/2007   Qualifier: Diagnosis of  By: Doy Mince LPN, Megan    . SUI (stress urinary incontinence, female)   . Thrombocytopenic disorder ([place]) 02/25/2017  . Wears glasses     Past Surgical History:  Procedure Laterality Date  . BLADDER SUSPENSION  10/13/2011   Procedure: TRANSVAGINAL TAPE (TVT)  PROCEDURE;  Surgeon: Delice Lesch, MD;  Location: [facility];  Service: Gynecology;  Laterality: N/A;  . BREAST BIOPSY Left 03/19/2004  . BREAST LUMPECTOMY Left 03-27-2004  . CARDIAC CATHETERIZATION  10-30-2000  dr Wynonia Lawman   normal coronary arteries and LVF/  ef 65-70%  . CLOSED MANIPULATION  W/ OPEN REDUCTION EXCHANGE TIBIAL FEMORAL BEARING POST TOTAL LEFT KNEE  04-14-2001  . CYSTOSCOPY  10/13/2011   Procedure: CYSTOSCOPY;  Surgeon: Delice Lesch, MD;  Location: [facility];  Service: Gynecology;  Laterality: Bilateral;  . DIAGNOSTIC LAPAROSCOPY     Colon d/t SBO  . DILATION AND CURETTAGE OF UTERUS  1968  . FOOT SURGERY Left 02-01-2009   TRIPLE ARTHRODESIS  . FOOT SURGERY Left 11/14   I&D left foot with woundvac placement  . INCISIONAL HERNIA REPAIR  05/05/2011   Procedure: LAPAROSCOPIC  INCISIONAL HERNIA;  Surgeon: Edward Jolly, MD;  Location: [facility];  Service: General;  Laterality: N/A;  LAPAROSCOPIC REPAIR INCARCERATED INCISIONAL HERNIA  WITH MESH  . KNEE ARTHROSCOPY Bilateral left [phone] & 04-1992/   right [phone]  . LAPAROSCOPIC CHOLECYSTECTOMY  06-21-2010  . LEFT HEART CATH AND CORONARY ANGIOGRAPHY N/A 04/13/2017   Procedure: LEFT HEART CATH AND CORONARY ANGIOGRAPHY;  Surgeon: Jettie Booze, MD;  Location: [facility];  Service: Cardiovascular;  Laterality: N/A;  . LUMBAR SPINE SURGERY  1998   L5 -- S1  . MASTECTOMY Right 1997   W/  LYMPH NODE DISSECTION AND RECONSTRUCTION WITH IMPLANTS AND EXPANDER  . NEGATIVE SLEEP STUDY  12/26/2015  . PORT-A-CATH PLACEMENT AND REMOVAL  1997  . REMOVAL RIGHT BREAST IMPLANT AND CAPSULECTOMY  06-03-1999  . REVERSE SHOULDER ARTHROPLASTY Left 10/25/2019   Procedure: LEFT REVERSE SHOULDER ARTHROPLASTY;  Surgeon: Meredith Pel, MD;  Location: [facility];  Service: Orthopedics;  Laterality: Left;  . REVISION TOTAL KNEE ARTHROPLASTY Left 07-07-2005  &  12-10-2001   12-10-2001  REMOVAL TOTAL KNEE/ I&D / INSERTION ANTIBIOTIC SPACER  . REVISION TOTAL KNEE ARTHROPLASTY Right 07-07-2005  . ROTATOR CUFF REPAIR Right   . TENDON REPAIR RIGHT RING FINGER  2011  . THUMB TRIGGER RELEASE Right 1993  . THYROIDECTOMY, PARTIAL  1983  . TONSILLECTOMY  1968  . TOTAL KNEE ARTHROPLASTY Bilateral right 05-04-2000/  left 03-29-2001  . VULVECTOMY  05/27/2012   Procedure: WIDE EXCISION VULVECTOMY;  Surgeon: Imagene Gurney A. Alycia Rossetti, MD;  Location: [facility];  Service: Gynecology;  Laterality: N/A;  Wide Local Excision of Vulva   . VULVECTOMY N/A 11/23/2012   Procedure: WIDE LOCAL EXCISION OF THE VULVA;  Surgeon: Imagene Gurney A. Alycia Rossetti, MD;  Location: [facility];  Service: Gynecology;  Laterality: N/A;  left inferior vulvar biopsy excision left superior lesion left inferior vulvar excision  . VULVECTOMY N/A 08/02/2013   Procedure: WIDE LOCAL  EXCISION VULVA;   Surgeon: Imagene Gurney A. Alycia Rossetti, MD;  Location: [facility];  Service: Gynecology;  Laterality: N/A;  . VULVECTOMY Left 03/29/2014   Procedure: WIDE LOCAL EXCISION OF LEFT VULVA ;  Surgeon: Imagene Gurney A. Alycia Rossetti, MD;  Location: [facility];  Service: Gynecology;  Laterality: Left;  Marland Kitchen VULVECTOMY Bilateral 01/24/2015   Procedure: WIDE LOCAL EXCISION VULVA;  Surgeon: [name], MD;  Location: [facility];  Service: Gynecology;  Laterality: Bilateral;  . VULVECTOMY PARTIAL  07-16-2010    There were no vitals filed for this visit.   Subjective Assessment - 01/02/20 1124    Subjective I was pretty sore after last session, about a day then  it was better.    Pertinent History "Shanika";  Left total shoulder replacement 10/25/19;  TKR had multiple infections; right hand dominant;  MD 8/30;  her husband had previous PT at this clinic with [facility]    Currently in Pain? Yes    Pain Score 3     Pain Location Shoulder    Pain Orientation Left    Pain Descriptors / Indicators Sore    Aggravating Factors  using it    Pain Relieving Factors ice                             [facility] Adult PT Treatment/Exercise - 01/02/20 0001      Shoulder Exercises: Supine   Flexion AAROM;Left    Flexion Limitations Semi reclined with cane 3x10, tehn with PTA assist 3x10      Shoulder Exercises: Seated   Other Seated Exercises UE ranger all planes 2x10, TC inhibit upper trap      Shoulder Exercises: Sidelying   ABduction AAROM;Left    ABduction Limitations 3x10, PTA assist, elbow bent, TC to inhibit UT      Shoulder Exercises: Pulleys   Flexion 2 minutes    Flexion Limitations VC/TC to keep from hiking a shoulder      Shoulder Exercises: Isometric Strengthening   Extension 5X10"    External Rotation 5X10"      Cryotherapy   Number Minutes Cryotherapy 10 Minutes    Cryotherapy Location Shoulder    Type of Cryotherapy Ice pack      Manual Therapy   Manual Therapy Soft tissue  mobilization;Passive ROM    Soft tissue mobilization Lt upper trap and upper arm in semi reclined post cane work     Passive ROM ER, IR, flexion                     PT Short Term Goals - 12/27/19 1905      PT SHORT TERM GOAL #1   Title The patient will demonstrate regular compliance of initial ROM and strengthing HEP    Time 4    Period Weeks    Status New    Target Date 01/24/20      PT SHORT TERM GOAL #2   Title The patient will report a 30% improvement in functional use of left UE to turn faucets on/off, flush the toilet and reach opposite side to put on deodorant    Time 4    Period Weeks    Status New      PT SHORT TERM GOAL #3   Title Left shoulder active assisted elevation to 125 degrees in semi-reclined position    Time 4    Period Weeks    Status New             PT Long Term Goals - 12/27/19 1911      PT LONG TERM GOAL #1   Title The patient will be independent in safe self progression of HEP    Time 8    Period Weeks    Status New    Target Date 02/21/20      PT LONG TERM GOAL #2   Title The patient will report a 50% improvement in functional use of left UE and pain level for light cooking, grooming and dressing tasks    Time 8    Period Weeks    Status New      PT LONG  TERM GOAL #3   Title The patient will 100 degrees of seated or standing elevation needed for reaching into lower kitchen cabinets    Time 8    Period Weeks    Status New      PT LONG TERM GOAL #4   Title The patient will have improved left shoulder strength and elbow strength to 3+/5 needed to lift the tea pitcher into the lower shelf of the fridge (with 2 hands)    Time 8    Period Weeks    Status New      PT LONG TERM GOAL #5   Title FOTO functional outcome score improved from 73% limitation to 40% indicating improved function and pain    Time 8    Period Weeks    Status New                 Plan - 01/02/20 1116    Clinical Impression Statement Pt has  difficulty with hiking her Lt upper trap to perform her AAROM exercises, requiring tactile cuing and assistance from PTA. Pt did show some progress with this as the treatment evolved today. Pain does not appears to be a limiting factor.    Personal Factors and Comorbidities Age;Fitness;Comorbidity 1;Comorbidity 2;Comorbidity 3+    Comorbidities osteoporosis; asthma; fibromyalgia, depression; cardiac; left LE multiple knee surgeries related to infection    Examination-Activity Limitations Bathing;Reach Overhead;Carry;Lift;Hygiene/Grooming;Dressing;Toileting    Examination-Participation Restrictions Meal Prep;Cleaning;Shop;Laundry    Stability/Clinical Decision Making Evolving/Moderate complexity    Rehab Potential Good    PT Frequency 2x / week    PT Duration 8 weeks    PT Treatment/Interventions ADLs/Self Care Home Management;Cryotherapy;Electrical Stimulation;Ultrasound;Moist Heat;Therapeutic exercise;Therapeutic activities;Patient/family education;Manual techniques;Taping;Vasopneumatic Device;Neuromuscular re-education    PT Next Visit Plan supine on wedge active assisted ROM; rythmic stabilization (while holding cane if needed);  try seated yellow band rows and extensions; biceps/triceps strengthening; low level UE Ranger; ice pack or Game Ready vasocompression    PT Home Exercise Plan [id number]    Consulted and Agree with Plan of Care Patient           Patient will benefit from skilled therapeutic intervention in order to improve the following deficits and impairments:  Pain, Impaired UE functional use, Decreased range of motion, Decreased strength, Impaired perceived functional ability  Visit Diagnosis: Stiffness of left shoulder, not elsewhere classified  Acute pain of left shoulder  Muscle weakness (generalized)  Localized edema     Problem List Patient Active Problem List   Diagnosis Date Noted  . S/P reverse total shoulder arthroplasty, left 10/25/2019  . History of  adenomatous polyp of colon 09/30/2018  . Shortness of breath   . ACS (acute coronary syndrome) ([place]) 04/11/2017  . Chest pain, rule out acute myocardial infarction 04/10/2017  . Bigeminy 04/10/2017  . Chronic cystitis 04/09/2017  . Thrombocytopenic disorder ([place]) 02/25/2017  . Osteoporosis 12/15/2016  . 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 Incomplete emptying of bladder 07/30/2016  . Left foot pain 12/11/2015  . 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 Stopped smoking with greater than 40 pack year history 10/23/2015  . 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 Mixed incontinence urge and stress 09/16/2011  . Cancer of right breast, stage 2 ([place]) 05/28/2011  . 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 Hypothyroid 05/28/2011  . GERD (gastroesophageal reflux disease) 05/28/2011  . Fibromyalgia 05/28/2011  . 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  Mild depression ([place]) 05/28/2011  . Thrombocytopenia, unspecified ([place]) 05/28/2011  . Morbid obesity ([place]) 04/10/2011  . GERD 11/09/2009  . ALLERGIC RHINITIS 08/23/2007  . ASTHMA 08/23/2007  . COUGH 08/23/2007  . SKIN CANCER, HX OF 08/23/2007    Robecca Fulgham, PTA 01/02/2020, 12:33 PM  [facility] [address], [place], [place], [address] Phone: [phone]   Fax:  [phone]  Name: [name] MRN: [id number] Date of Birth: 1945-10-17

## 2020-01-04 ENCOUNTER — Ambulatory Visit: Payer: Medicare HMO | Admitting: Physical Therapy

## 2020-01-04 ENCOUNTER — Other Ambulatory Visit: Payer: Self-pay

## 2020-01-04 ENCOUNTER — Encounter: Payer: Self-pay | Admitting: Physical Therapy

## 2020-01-04 DIAGNOSIS — M25612 Stiffness of left shoulder, not elsewhere classified: Secondary | ICD-10-CM

## 2020-01-04 DIAGNOSIS — M25512 Pain in left shoulder: Secondary | ICD-10-CM

## 2020-01-04 DIAGNOSIS — M6281 Muscle weakness (generalized): Secondary | ICD-10-CM

## 2020-01-04 DIAGNOSIS — R600 Localized edema: Secondary | ICD-10-CM

## 2020-01-04 NOTE — Therapy (Signed)
[facility] [address], [place], [place], [address] Phone: [phone]   Fax:  [phone]  Physical Therapy Treatment  Patient Details  Name: [name] MRN: [id number] Date of Birth: 10-18-1945 Referring Provider (PT): Dr. Marlou Sa    Encounter Date: 01/04/2020   PT End of Session - 01/04/20 1402    Visit Number 4    Date for PT Re-Evaluation 02/21/20    Authorization Type [employer]    PT Start Time 1401    PT Stop Time 1455    PT Time Calculation (min) 54 min    Activity Tolerance Patient tolerated treatment well    Behavior During Therapy [facility] for tasks assessed/performed           Past Medical History:  Diagnosis Date  . ACS (acute coronary syndrome) ([place]) 04/11/2017  . ALLERGIC RHINITIS 08/23/2007   Qualifier: Diagnosis of  By: Doy Mince LPN, Megan    . Anxiety   . Arthritis   . Asthma   . ASTHMA 08/23/2007   Qualifier: Diagnosis of  By: Doy Mince LPN, Megan    . Bigeminy 04/10/2017  . 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 Cancer of right breast, stage 2 ([place]) 05/28/2011   2.8 cm invasive 1 node pos ER pos S/P mastectomy 06/1995 then AC x 4 then Tam X 5   . Chest pain, rule out acute myocardial infarction 04/10/2017  . Chronic constipation   . Chronic cystitis 04/09/2017  . Cough 08/23/2007   Qualifier: Diagnosis of  By: Gwenette Greet MD, Armando Reichert   . Depression   . 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 Dysrhythmia 2020   Had Afib  . 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 Fibromyalgia   . GERD (gastroesophageal reflux disease)   . History of adenomatous polyp of colon 09/30/2018  . 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 History of colon polyps     2007 hyperplagia  . History of esophageal dilatation    2008  . History of peptic ulcer   . History of small bowel obstruction    2012  . Hypothyroid 05/28/2011  . Hypothyroidism   . Incomplete emptying of bladder 07/30/2016  . Left foot pain 12/11/2015   Overview:  Dr Cyril Mourning [facility]  . Macular degeneration of right eye   . Mild depression ([place]) 05/28/2011  . Mixed incontinence urge and stress 09/16/2011   S/p lumax desires surgery   . Morbid obesity ([place]) 04/10/2011  . Numbness of left foot    4 toes  . Osteoporosis 12/15/2016   Overview:  Dr Cletis Media  . Paget's disease of vulva   . Personal history of chemotherapy 1997  . Personal history of radiation therapy 2005  . PONV (postoperative nausea and vomiting)    and HARD TO WAKE  . Seasonal allergies   . Shortness of breath   . SKIN CANCER, HX OF 08/23/2007   Qualifier: Diagnosis of  By: Doy Mince LPN, Megan    . SUI (stress urinary incontinence, female)   . Thrombocytopenic disorder ([place]) 02/25/2017  . Wears glasses     Past Surgical History:  Procedure Laterality Date  . BLADDER SUSPENSION  10/13/2011   Procedure: TRANSVAGINAL TAPE (TVT)  PROCEDURE;  Surgeon: Delice Lesch, MD;  Location: [facility];  Service: Gynecology;  Laterality: N/A;  . BREAST BIOPSY Left 03/19/2004  . BREAST LUMPECTOMY Left 03-27-2004  . CARDIAC CATHETERIZATION  10-30-2000  dr Wynonia Lawman   normal coronary arteries and LVF/  ef 65-70%  . CLOSED MANIPULATION  W/ OPEN REDUCTION EXCHANGE TIBIAL FEMORAL BEARING POST TOTAL LEFT KNEE  04-14-2001  . CYSTOSCOPY  10/13/2011   Procedure: CYSTOSCOPY;  Surgeon: Delice Lesch, MD;  Location: [facility];  Service: Gynecology;  Laterality: Bilateral;  . DIAGNOSTIC LAPAROSCOPY     Colon d/t SBO  . DILATION AND CURETTAGE OF UTERUS  1968  . FOOT SURGERY Left 02-01-2009   TRIPLE ARTHRODESIS  . FOOT SURGERY Left 11/14   I&D left foot with woundvac placement  . INCISIONAL HERNIA REPAIR  05/05/2011   Procedure: LAPAROSCOPIC  INCISIONAL HERNIA;  Surgeon: Edward Jolly, MD;  Location: [facility];  Service: General;  Laterality: N/A;  LAPAROSCOPIC REPAIR INCARCERATED INCISIONAL HERNIA  WITH MESH  . KNEE ARTHROSCOPY Bilateral left [id number] & 04-1992/   right [phone]  . LAPAROSCOPIC CHOLECYSTECTOMY  06-21-2010  . LEFT HEART CATH AND CORONARY ANGIOGRAPHY N/A 04/13/2017   Procedure: LEFT HEART CATH AND CORONARY ANGIOGRAPHY;  Surgeon: Jettie Booze, MD;  Location: [facility];  Service: Cardiovascular;  Laterality: N/A;  . LUMBAR SPINE SURGERY  1998   L5 -- S1  . MASTECTOMY Right 1997   W/  LYMPH NODE DISSECTION AND RECONSTRUCTION WITH IMPLANTS AND EXPANDER  . NEGATIVE SLEEP STUDY  12/26/2015  . PORT-A-CATH PLACEMENT AND REMOVAL  1997  . REMOVAL RIGHT BREAST IMPLANT AND CAPSULECTOMY  06-03-1999  . REVERSE SHOULDER ARTHROPLASTY Left 10/25/2019   Procedure: LEFT REVERSE SHOULDER ARTHROPLASTY;  Surgeon: Meredith Pel, MD;  Location: [facility];  Service: Orthopedics;  Laterality: Left;  . REVISION TOTAL KNEE ARTHROPLASTY Left 07-07-2005  &  12-10-2001   12-10-2001  REMOVAL TOTAL KNEE/ I&D / INSERTION ANTIBIOTIC SPACER  . REVISION TOTAL KNEE ARTHROPLASTY Right 07-07-2005  . ROTATOR CUFF REPAIR Right   . TENDON REPAIR RIGHT RING FINGER  2011  . THUMB TRIGGER RELEASE Right 1993  . THYROIDECTOMY, PARTIAL  1983  . TONSILLECTOMY  1968  . TOTAL KNEE ARTHROPLASTY Bilateral right 05-04-2000/  left 03-29-2001  . VULVECTOMY  05/27/2012   Procedure: WIDE EXCISION VULVECTOMY;  Surgeon: Imagene Gurney A. Alycia Rossetti, MD;  Location: [facility];  Service: Gynecology;  Laterality: N/A;  Wide Local Excision of Vulva   . VULVECTOMY N/A 11/23/2012   Procedure: WIDE LOCAL EXCISION OF THE VULVA;  Surgeon: Imagene Gurney A. Alycia Rossetti, MD;  Location: [facility];  Service: Gynecology;  Laterality: N/A;  left inferior vulvar biopsy excision left superior lesion left inferior vulvar excision  . VULVECTOMY N/A 08/02/2013   Procedure: WIDE LOCAL  EXCISION VULVA;   Surgeon: Imagene Gurney A. Alycia Rossetti, MD;  Location: [facility];  Service: Gynecology;  Laterality: N/A;  . VULVECTOMY Left 03/29/2014   Procedure: WIDE LOCAL EXCISION OF LEFT VULVA ;  Surgeon: Imagene Gurney A. Alycia Rossetti, MD;  Location: [facility];  Service: Gynecology;  Laterality: Left;  Marland Kitchen VULVECTOMY Bilateral 01/24/2015   Procedure: WIDE LOCAL EXCISION VULVA;  Surgeon: [name], MD;  Location: [facility];  Service: Gynecology;  Laterality: Bilateral;  . VULVECTOMY PARTIAL  07-16-2010    There were no vitals filed for this visit.   Subjective Assessment - 01/04/20 1404    Subjective i was not as sore last session. No pain or soreness  today.    Pertinent History "[place]";  Left total shoulder replacement 10/25/19;  TKR had multiple infections; right hand dominant;  MD 8/30;  her husband had previous PT at this clinic with [facility]    Currently in Pain? No/denies                             [facility] Adult PT Treatment/Exercise - 01/04/20 0001      Shoulder Exercises: Seated   Flexion AAROM;Left   with cane and PT Aassit, TC at UT 3x10   Other Seated Exercises UE ranger 20x in all planes, less tactile cues needed.     Other Seated Exercises scapular retraction and depression with PTA cuing manually 5 sec 2x10       Shoulder Exercises: Standing   Flexion AAROM;Left;20 reps    Flexion Limitations Arm supported on the hi lo table     Row Strengthening;Left;Both;Theraband    Theraband Level (Shoulder Row) Level 2 (Red)      Shoulder Exercises: Pulleys   Flexion 3 minutes    Flexion Limitations VC to start small ROM then increase as she can keep proper mechanics       Cryotherapy   Number Minutes Cryotherapy 10 Minutes    Cryotherapy Location Shoulder    Type of Cryotherapy Ice pack      Manual Therapy   Passive ROM ER, IR, flexion, scaption 20x each  pt semi reclined                      PT Short Term Goals - 12/27/19 1905      PT SHORT TERM GOAL  #1   Title The patient will demonstrate regular compliance of initial ROM and strengthing HEP    Time 4    Period Weeks    Status New    Target Date 01/24/20      PT SHORT TERM GOAL #2   Title The patient will report a 30% improvement in functional use of left UE to turn faucets on/off, flush the toilet and reach opposite side to put on deodorant    Time 4    Period Weeks    Status New      PT SHORT TERM GOAL #3   Title Left shoulder active assisted elevation to 125 degrees in semi-reclined position    Time 4    Period Weeks    Status New             PT Long Term Goals - 12/27/19 1911      PT LONG TERM GOAL #1   Title The patient will be independent in safe self progression of HEP    Time 8    Period Weeks    Status New    Target Date 02/21/20      PT LONG TERM GOAL #2   Title The patient will report a 50% improvement in functional use of left UE and pain level for light cooking, grooming and dressing tasks    Time 8    Period Weeks    Status New      PT LONG TERM GOAL #3   Title The patient will 100 degrees of seated or standing elevation needed for reaching into lower kitchen cabinets    Time 8    Period Weeks    Status New      PT LONG TERM GOAL #4   Title The patient  will have improved left shoulder strength and elbow strength to 3+/5 needed to lift the tea pitcher into the lower shelf of the fridge (with 2 hands)    Time 8    Period Weeks    Status New      PT LONG TERM GOAL #5   Title FOTO functional outcome score improved from 73% limitation to 40% indicating improved function and pain    Time 8    Period Weeks    Status New                 Plan - 01/04/20 1403    Clinical Impression Statement Pt arrives with no pain. Treatment focuses around [facility] of the LT shoulder. Pt needs a variety of cuing to inhibit her upper trap. pt showed improvement throughout the session.    Personal Factors and Comorbidities Age;Fitness;Comorbidity 1;Comorbidity  2;Comorbidity 3+    Comorbidities osteoporosis; asthma; fibromyalgia, depression; cardiac; left LE multiple knee surgeries related to infection    Examination-Activity Limitations Bathing;Reach Overhead;Carry;Lift;Hygiene/Grooming;Dressing;Toileting    Examination-Participation Restrictions Meal Prep;Cleaning;Shop;Laundry    Stability/Clinical Decision Making Evolving/Moderate complexity    Rehab Potential Good    PT Frequency 2x / week    PT Duration 8 weeks    PT Treatment/Interventions ADLs/Self Care Home Management;Cryotherapy;Electrical Stimulation;Ultrasound;Moist Heat;Therapeutic exercise;Therapeutic activities;Patient/family education;Manual techniques;Taping;Vasopneumatic Device;Neuromuscular re-education    PT Next Visit Plan P/AAROM LT shoulder scap stabilization supine and seated    PT Home Exercise Plan [id number]    Consulted and Agree with Plan of Care Patient           Patient will benefit from skilled therapeutic intervention in order to improve the following deficits and impairments:  Pain, Impaired UE functional use, Decreased range of motion, Decreased strength, Impaired perceived functional ability  Visit Diagnosis: Stiffness of left shoulder, not elsewhere classified  Acute pain of left shoulder  Muscle weakness (generalized)  Localized edema     Problem List Patient Active Problem List   Diagnosis Date Noted  . S/P reverse total shoulder arthroplasty, left 10/25/2019  . History of adenomatous polyp of colon 09/30/2018  . Shortness of breath   . ACS (acute coronary syndrome) ([place]) 04/11/2017  . Chest pain, rule out acute myocardial infarction 04/10/2017  . Bigeminy 04/10/2017  . Chronic cystitis 04/09/2017  . Thrombocytopenic disorder ([place]) 02/25/2017  . Osteoporosis 12/15/2016  . 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 Incomplete emptying of bladder 07/30/2016  . Left foot pain 12/11/2015  . Ductal carcinoma in situ (DCIS) of  left breast 10/23/2015  . Stopped smoking with greater than 40 pack year history 10/23/2015  . 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 Mixed incontinence urge and stress 09/16/2011  . Cancer of right breast, stage 2 ([place]) 05/28/2011  . 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 Hypothyroid 05/28/2011  . GERD (gastroesophageal reflux disease) 05/28/2011  . Fibromyalgia 05/28/2011  . 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 Mild depression ([place]) 05/28/2011  . Thrombocytopenia, unspecified ([place]) 05/28/2011  . Morbid obesity ([place]) 04/10/2011  . GERD 11/09/2009  . ALLERGIC RHINITIS 08/23/2007  . ASTHMA 08/23/2007  . COUGH 08/23/2007  . SKIN CANCER, HX OF 08/23/2007    [name], PTA 01/04/2020, 2:47 PM  [facility] [address], [place], [place], [address] Phone: [phone]   Fax:  [phone]  Name: [name] MRN: [id number] Date of Birth: 01-04-1946

## 2020-01-06 ENCOUNTER — Ambulatory Visit: Payer: Medicare HMO | Admitting: Physical Therapy

## 2020-01-10 ENCOUNTER — Other Ambulatory Visit: Payer: Self-pay

## 2020-01-10 ENCOUNTER — Encounter: Payer: Self-pay | Admitting: Physical Therapy

## 2020-01-10 ENCOUNTER — Ambulatory Visit: Payer: Medicare HMO | Admitting: Physical Therapy

## 2020-01-10 DIAGNOSIS — R600 Localized edema: Secondary | ICD-10-CM

## 2020-01-10 DIAGNOSIS — M6281 Muscle weakness (generalized): Secondary | ICD-10-CM

## 2020-01-10 DIAGNOSIS — M25512 Pain in left shoulder: Secondary | ICD-10-CM

## 2020-01-10 DIAGNOSIS — M25612 Stiffness of left shoulder, not elsewhere classified: Secondary | ICD-10-CM | POA: Diagnosis not present

## 2020-01-10 NOTE — Therapy (Signed)
[facility] [address] [place], [place], [address] Phone: [phone]   Fax:  [phone]  Physical Therapy Treatment  Patient Details  Name: [name] MRN: [id number] Date of Birth: 18-Dec-1945 Referring Provider (PT): Dr. Marlou Sa    Encounter Date: 01/10/2020   PT End of Session - 01/10/20 1434    Visit Number 5    Date for PT Re-Evaluation 02/21/20    Authorization Type [employer]    PT Start Time 2505    PT Stop Time 1452    PT Time Calculation (min) 48 min    Activity Tolerance Patient tolerated treatment well;No increased pain    Behavior During Therapy WFL for tasks assessed/performed           Past Medical History:  Diagnosis Date  . ACS (acute coronary syndrome) ([place]) 04/11/2017  . ALLERGIC RHINITIS 08/23/2007   Qualifier: Diagnosis of  By: Doy Mince LPN, Megan    . Anxiety   . Arthritis   . Asthma   . ASTHMA 08/23/2007   Qualifier: Diagnosis of  By: Doy Mince LPN, Megan    . Bigeminy 04/10/2017  . 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 Cancer of right breast, stage 2 ([place]) 05/28/2011   2.8 cm invasive 1 node pos ER pos S/P mastectomy 06/1995 then AC x 4 then Tam X 5   . Chest pain, rule out acute myocardial infarction 04/10/2017  . Chronic constipation   . Chronic cystitis 04/09/2017  . Cough 08/23/2007   Qualifier: Diagnosis of  By: Gwenette Greet MD, Armando Reichert   . Depression   . 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 Dysrhythmia 2020   Had Afib  . 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 Fibromyalgia   . GERD (gastroesophageal reflux disease)   . History of adenomatous polyp of colon 09/30/2018  . 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 History of  colon polyps    2007 hyperplagia  . History of esophageal dilatation    2008  . History of peptic ulcer   . History of small bowel obstruction    2012  . Hypothyroid 05/28/2011  . Hypothyroidism   . Incomplete emptying of bladder 07/30/2016  . Left foot pain 12/11/2015   Overview:  Dr Cyril Mourning [facility]  . Macular degeneration of right eye   . Mild depression ([place]) 05/28/2011  . Mixed incontinence urge and stress 09/16/2011   S/p lumax desires surgery   . Morbid obesity ([place]) 04/10/2011  . Numbness of left foot    4 toes  . Osteoporosis 12/15/2016   Overview:  Dr Cletis Media  . Paget's disease of vulva   . Personal history of chemotherapy 1997  . Personal history of radiation therapy 2005  . PONV (postoperative nausea and vomiting)    and HARD TO WAKE  . Seasonal allergies   . Shortness of breath   . SKIN CANCER, HX OF 08/23/2007   Qualifier: Diagnosis of  By: Doy Mince LPN, Megan    . SUI (stress urinary incontinence, female)   . Thrombocytopenic disorder ([place]) 02/25/2017  . Wears glasses     Past Surgical History:  Procedure Laterality Date  . BLADDER SUSPENSION  10/13/2011   Procedure: TRANSVAGINAL  TAPE (TVT) PROCEDURE;  Surgeon: Delice Lesch, MD;  Location: [facility];  Service: Gynecology;  Laterality: N/A;  . BREAST BIOPSY Left 03/19/2004  . BREAST LUMPECTOMY Left 03-27-2004  . CARDIAC CATHETERIZATION  10-30-2000  dr Wynonia Lawman   normal coronary arteries and LVF/  ef 65-70%  . CLOSED MANIPULATION  W/ OPEN REDUCTION EXCHANGE TIBIAL FEMORAL BEARING POST TOTAL LEFT KNEE  04-14-2001  . CYSTOSCOPY  10/13/2011   Procedure: CYSTOSCOPY;  Surgeon: Delice Lesch, MD;  Location: [facility];  Service: Gynecology;  Laterality: Bilateral;  . DIAGNOSTIC LAPAROSCOPY     Colon d/t SBO  . DILATION AND CURETTAGE OF UTERUS  1968  . FOOT SURGERY Left 02-01-2009   TRIPLE ARTHRODESIS  . FOOT SURGERY Left 11/14   I&D left foot with woundvac placement  . INCISIONAL HERNIA REPAIR  05/05/2011   Procedure:  LAPAROSCOPIC INCISIONAL HERNIA;  Surgeon: Edward Jolly, MD;  Location: [facility];  Service: General;  Laterality: N/A;  LAPAROSCOPIC REPAIR INCARCERATED INCISIONAL HERNIA  WITH MESH  . KNEE ARTHROSCOPY Bilateral left [phone] & 04-1992/   right [phone]  . LAPAROSCOPIC CHOLECYSTECTOMY  06-21-2010  . LEFT HEART CATH AND CORONARY ANGIOGRAPHY N/A 04/13/2017   Procedure: LEFT HEART CATH AND CORONARY ANGIOGRAPHY;  Surgeon: Jettie Booze, MD;  Location: [facility];  Service: Cardiovascular;  Laterality: N/A;  . LUMBAR SPINE SURGERY  1998   L5 -- S1  . MASTECTOMY Right 1997   W/  LYMPH NODE DISSECTION AND RECONSTRUCTION WITH IMPLANTS AND EXPANDER  . NEGATIVE SLEEP STUDY  12/26/2015  . PORT-A-CATH PLACEMENT AND REMOVAL  1997  . REMOVAL RIGHT BREAST IMPLANT AND CAPSULECTOMY  06-03-1999  . REVERSE SHOULDER ARTHROPLASTY Left 10/25/2019   Procedure: LEFT REVERSE SHOULDER ARTHROPLASTY;  Surgeon: Meredith Pel, MD;  Location: [facility];  Service: Orthopedics;  Laterality: Left;  . REVISION TOTAL KNEE ARTHROPLASTY Left 07-07-2005  &  12-10-2001   12-10-2001  REMOVAL TOTAL KNEE/ I&D / INSERTION ANTIBIOTIC SPACER  . REVISION TOTAL KNEE ARTHROPLASTY Right 07-07-2005  . ROTATOR CUFF REPAIR Right   . TENDON REPAIR RIGHT RING FINGER  2011  . THUMB TRIGGER RELEASE Right 1993  . THYROIDECTOMY, PARTIAL  1983  . TONSILLECTOMY  1968  . TOTAL KNEE ARTHROPLASTY Bilateral right 05-04-2000/  left 03-29-2001  . VULVECTOMY  05/27/2012   Procedure: WIDE EXCISION VULVECTOMY;  Surgeon: Imagene Gurney A. Alycia Rossetti, MD;  Location: [facility];  Service: Gynecology;  Laterality: N/A;  Wide Local Excision of Vulva   . VULVECTOMY N/A 11/23/2012   Procedure: WIDE LOCAL EXCISION OF THE VULVA;  Surgeon: Imagene Gurney A. Alycia Rossetti, MD;  Location: [facility];  Service: Gynecology;  Laterality: N/A;  left inferior vulvar biopsy excision left superior lesion left inferior vulvar excision  . VULVECTOMY N/A 08/02/2013   Procedure: WIDE LOCAL  EXCISION  VULVA;  Surgeon: Imagene Gurney A. Alycia Rossetti, MD;  Location: [facility];  Service: Gynecology;  Laterality: N/A;  . VULVECTOMY Left 03/29/2014   Procedure: WIDE LOCAL EXCISION OF LEFT VULVA ;  Surgeon: Imagene Gurney A. Alycia Rossetti, MD;  Location[facility];  Service: Gynecology;  Laterality: Left;  Marland Kitchen VULVECTOMY Bilateral 01/24/2015   Procedure: WIDE LOCAL EXCISION VULVA;  Surgeon: [name], MD;  Location: [facility];  Service: Gynecology;  Laterality: Bilateral;  . VULVECTOMY PARTIAL  07-16-2010    There were no vitals filed for this visit.   Subjective Assessment - 01/10/20 1410    Subjective Pt states that things are going well. She likes  having the ice on her shoulder at the end.    Pertinent History "No[place]";  Left total shoulder replacement 10/25/19;  TKR had multiple infections; right hand dominant;  MD 8/30;  her husband had previous PT at this clinic with [facility]    Currently in Pain? No/denies                             [facility] Adult PT Treatment/Exercise - 01/10/20 0001      Shoulder Exercises: Supine   Flexion AAROM;Both;10 reps    Flexion Limitations incline     Other Supine Exercises incline Lt bicep curls #2 3x10 reps       Shoulder Exercises: Seated   Other Seated Exercises Lt UE ranger on floor x20 reps circles CW and CCW, flexion x20 reps       Shoulder Exercises: ROM/Strengthening   Proximal Shoulder Strengthening, Supine shoulder elevation holding SPC with PT providing external perturbations 2x20 reps     Ball on Wall Lt UE on incline mat- 2x20 sec perturbations at elbow/forearm      Cryotherapy   Number Minutes Cryotherapy 10 Minutes    Cryotherapy Location Shoulder    Type of Cryotherapy Ice pack                  PT Education - 01/10/20 1434    Education Details technique with therex    Person(s) Educated Patient    Methods Explanation;Verbal cues    Comprehension Verbalized understanding;Returned demonstration             PT Short Term Goals - 12/27/19 1905      PT SHORT TERM GOAL #1   Title The patient will demonstrate regular compliance of initial ROM and strengthing HEP    Time 4    Period Weeks    Status New    Target Date 01/24/20      PT SHORT TERM GOAL #2   Title The patient will report a 30% improvement in functional use of left UE to turn faucets on/off, flush the toilet and reach opposite side to put on deodorant    Time 4    Period Weeks    Status New      PT SHORT TERM GOAL #3   Title Left shoulder active assisted elevation to 125 degrees in semi-reclined position    Time 4    Period Weeks    Status New             PT Long Term Goals - 12/27/19 1911      PT LONG TERM GOAL #1   Title The patient will be independent in safe self progression of HEP    Time 8    Period Weeks    Status New    Target Date 02/21/20      PT LONG TERM GOAL #2   Title The patient will report a 50% improvement in functional use of left UE and pain level for light cooking, grooming and dressing tasks    Time 8    Period Weeks    Status New      PT LONG TERM GOAL #3   Title The patient will 100 degrees of seated or standing elevation needed for reaching into lower kitchen cabinets    Time 8    Period Weeks    Status New      PT LONG TERM GOAL #4   Title The patient  will have improved left shoulder strength and elbow strength to 3+/5 needed to lift the tea pitcher into the lower shelf of the fridge (with 2 hands)    Time 8    Period Weeks    Status New      PT LONG TERM GOAL #5   Title FOTO functional outcome score improved from 73% limitation to 40% indicating improved function and pain    Time 8    Period Weeks    Status New                 Plan - 01/10/20 1456    Clinical Impression Statement Pt's awareness of shoulder shrug compensation has greatly improved. She was able to complete bicep curls with light resistance, noting no pain or compensations with technique. PT also  addressed glenohumeral control with rhytmic stabilization in open and closed chain positions. Ended with ice pack to prevent inflammation.    Personal Factors and Comorbidities Age;Fitness;Comorbidity 1;Comorbidity 2;Comorbidity 3+    Comorbidities osteoporosis; asthma; fibromyalgia, depression; cardiac; left LE multiple knee surgeries related to infection    Examination-Activity Limitations Bathing;Reach Overhead;Carry;Lift;Hygiene/Grooming;Dressing;Toileting    Examination-Participation Restrictions Meal Prep;Cleaning;Shop;Laundry    Stability/Clinical Decision Making Evolving/Moderate complexity    Rehab Potential Good    PT Frequency 2x / week    PT Duration 8 weeks    PT Treatment/Interventions ADLs/Self Care Home Management;Cryotherapy;Electrical Stimulation;Ultrasound;Moist Heat;Therapeutic exercise;Therapeutic activities;Patient/family education;Manual techniques;Taping;Vasopneumatic Device;Neuromuscular re-education    PT Next Visit Plan P/AAROM LT shoulder scap stabilization supine and seated    PT Home Exercise Plan [id number]    Consulted and Agree with Plan of Care Patient           Patient will benefit from skilled therapeutic intervention in order to improve the following deficits and impairments:  Pain, Impaired UE functional use, Decreased range of motion, Decreased strength, Impaired perceived functional ability  Visit Diagnosis: Stiffness of left shoulder, not elsewhere classified  Acute pain of left shoulder  Muscle weakness (generalized)  Localized edema     Problem List Patient Active Problem List   Diagnosis Date Noted  . S/P reverse total shoulder arthroplasty, left 10/25/2019  . History of adenomatous polyp of colon 09/30/2018  . Shortness of breath   . ACS (acute coronary syndrome) ([place]) 04/11/2017  . Chest pain, rule out acute myocardial infarction 04/10/2017  . Bigeminy 04/10/2017  . Chronic cystitis 04/09/2017  . Thrombocytopenic disorder ([place])  02/25/2017  . Osteoporosis 12/15/2016  . 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 Incomplete emptying of bladder 07/30/2016  . Left foot pain 12/11/2015  . 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 Stopped smoking with greater than 40 pack year history 10/23/2015  . 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 Mixed incontinence urge and stress 09/16/2011  . Cancer of right breast, stage 2 ([place]) 05/28/2011  . 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 Hypothyroid 05/28/2011  . GERD (gastroesophageal reflux disease) 05/28/2011  . Fibromyalgia 05/28/2011  . 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 Mild depression ([place]) 05/28/2011  . Thrombocytopenia, unspecified ([place]) 05/28/2011  . Morbid obesity ([place]) 04/10/2011  . GERD 11/09/2009  . ALLERGIC RHINITIS 08/23/2007  . ASTHMA 08/23/2007  . COUGH 08/23/2007  . SKIN CANCER, HX OF 08/23/2007   3:05 PM,01/10/20 Sherol Dade PT, DPT [facility] [address] [place],  [place], [address] Phone: [phone]   Fax:  [phone]  Name: [name] MRN: [id number] Date of Birth: June 03, 1945

## 2020-01-11 ENCOUNTER — Other Ambulatory Visit: Payer: Self-pay

## 2020-01-11 ENCOUNTER — Ambulatory Visit: Payer: Medicare HMO | Admitting: Physical Therapy

## 2020-01-11 DIAGNOSIS — M6281 Muscle weakness (generalized): Secondary | ICD-10-CM

## 2020-01-11 DIAGNOSIS — M25612 Stiffness of left shoulder, not elsewhere classified: Secondary | ICD-10-CM | POA: Diagnosis not present

## 2020-01-11 DIAGNOSIS — R600 Localized edema: Secondary | ICD-10-CM

## 2020-01-11 DIAGNOSIS — M25512 Pain in left shoulder: Secondary | ICD-10-CM

## 2020-01-11 NOTE — Therapy (Signed)
[facility] [address], [place], [place], [address] Phone: [phone]   Fax:  [phone]  Physical Therapy Treatment  Patient Details  Name: [name] MRN: [id number] Date of Birth: Jul 22, 1945 Referring Provider (PT): Dr. Marlou Sa    Encounter Date: 01/11/2020   PT End of Session - 01/11/20 1104    Visit Number 6    Date for PT Re-Evaluation 02/21/20    Authorization Type [employer]    PT Start Time 0932    PT Stop Time 1024    PT Time Calculation (min) 52 min    Activity Tolerance Patient tolerated treatment well;No increased pain    Behavior During Therapy WFL for tasks assessed/performed           Past Medical History:  Diagnosis Date  . ACS (acute coronary syndrome) ([place]) 04/11/2017  . ALLERGIC RHINITIS 08/23/2007   Qualifier: Diagnosis of  By: Doy Mince LPN, Megan    . Anxiety   . Arthritis   . Asthma   . ASTHMA 08/23/2007   Qualifier: Diagnosis of  By: Doy Mince LPN, Megan    . Bigeminy 04/10/2017  . 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 Cancer of right breast, stage 2 ([place]) 05/28/2011   2.8 cm invasive 1 node pos ER pos S/P mastectomy 06/1995 then AC x 4 then Tam X 5   . Chest pain, rule out acute myocardial infarction 04/10/2017  . Chronic constipation   . Chronic cystitis 04/09/2017  . Cough 08/23/2007   Qualifier: Diagnosis of  By: Gwenette Greet MD, Armando Reichert   . Depression   . 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 Dysrhythmia 2020   Had Afib  . 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 Fibromyalgia   . GERD (gastroesophageal reflux disease)   . History of adenomatous polyp of colon 09/30/2018  . 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 History of  colon polyps    2007 hyperplagia  . History of esophageal dilatation    2008  . History of peptic ulcer   . History of small bowel obstruction    2012  . Hypothyroid 05/28/2011  . Hypothyroidism   . Incomplete emptying of bladder 07/30/2016  . Left foot pain 12/11/2015   Overview:  Dr Cyril Mourning [facility]  . Macular degeneration of right eye   . Mild depression ([place]) 05/28/2011  . Mixed incontinence urge and stress 09/16/2011   S/p lumax desires surgery   . Morbid obesity ([place]) 04/10/2011  . Numbness of left foot    4 toes  . Osteoporosis 12/15/2016   Overview:  Dr Cletis Media  . Paget's disease of vulva   . Personal history of chemotherapy 1997  . Personal history of radiation therapy 2005  . PONV (postoperative nausea and vomiting)    and HARD TO WAKE  . Seasonal allergies   . Shortness of breath   . SKIN CANCER, HX OF 08/23/2007   Qualifier: Diagnosis of  By: Doy Mince LPN, Megan    . SUI (stress urinary incontinence, female)   . Thrombocytopenic disorder ([place]) 02/25/2017  . Wears glasses     Past Surgical History:  Procedure Laterality Date  . BLADDER SUSPENSION  10/13/2011   Procedure: TRANSVAGINAL  TAPE (TVT) PROCEDURE;  Surgeon: Delice Lesch, MD;  Location: [facility];  Service: Gynecology;  Laterality: N/A;  . BREAST BIOPSY Left 03/19/2004  . BREAST LUMPECTOMY Left 03-27-2004  . CARDIAC CATHETERIZATION  10-30-2000  dr Wynonia Lawman   normal coronary arteries and LVF/  ef 65-70%  . CLOSED MANIPULATION  W/ OPEN REDUCTION EXCHANGE TIBIAL FEMORAL BEARING POST TOTAL LEFT KNEE  04-14-2001  . CYSTOSCOPY  10/13/2011   Procedure: CYSTOSCOPY;  Surgeon: Delice Lesch, MD;  Location: [facility];  Service: Gynecology;  Laterality: Bilateral;  . DIAGNOSTIC LAPAROSCOPY     Colon d/t SBO  . DILATION AND CURETTAGE OF UTERUS  1968  . FOOT SURGERY Left 02-01-2009   TRIPLE ARTHRODESIS  . FOOT SURGERY Left 11/14   I&D left foot with woundvac placement  . INCISIONAL HERNIA REPAIR  05/05/2011   Procedure:  LAPAROSCOPIC INCISIONAL HERNIA;  Surgeon: Edward Jolly, MD;  Location: [facility];  Service: General;  Laterality: N/A;  LAPAROSCOPIC REPAIR INCARCERATED INCISIONAL HERNIA  WITH MESH  . KNEE ARTHROSCOPY Bilateral left [phone] & 04-1992/   right [phone]  . LAPAROSCOPIC CHOLECYSTECTOMY  06-21-2010  . LEFT HEART CATH AND CORONARY ANGIOGRAPHY N/A 04/13/2017   Procedure: LEFT HEART CATH AND CORONARY ANGIOGRAPHY;  Surgeon: Jettie Booze, MD;  Location: [facility];  Service: Cardiovascular;  Laterality: N/A;  . LUMBAR SPINE SURGERY  1998   L5 -- S1  . MASTECTOMY Right 1997   W/  LYMPH NODE DISSECTION AND RECONSTRUCTION WITH IMPLANTS AND EXPANDER  . NEGATIVE SLEEP STUDY  12/26/2015  . PORT-A-CATH PLACEMENT AND REMOVAL  1997  . REMOVAL RIGHT BREAST IMPLANT AND CAPSULECTOMY  06-03-1999  . REVERSE SHOULDER ARTHROPLASTY Left 10/25/2019   Procedure: LEFT REVERSE SHOULDER ARTHROPLASTY;  Surgeon: Meredith Pel, MD;  Location: [facility];  Service: Orthopedics;  Laterality: Left;  . REVISION TOTAL KNEE ARTHROPLASTY Left 07-07-2005  &  12-10-2001   12-10-2001  REMOVAL TOTAL KNEE/ I&D / INSERTION ANTIBIOTIC SPACER  . REVISION TOTAL KNEE ARTHROPLASTY Right 07-07-2005  . ROTATOR CUFF REPAIR Right   . TENDON REPAIR RIGHT RING FINGER  2011  . THUMB TRIGGER RELEASE Right 1993  . THYROIDECTOMY, PARTIAL  1983  . TONSILLECTOMY  1968  . TOTAL KNEE ARTHROPLASTY Bilateral right 05-04-2000/  left 03-29-2001  . VULVECTOMY  05/27/2012   Procedure: WIDE EXCISION VULVECTOMY;  Surgeon: Imagene Gurney A. Alycia Rossetti, MD;  Location: [facility];  Service: Gynecology;  Laterality: N/A;  Wide Local Excision of Vulva   . VULVECTOMY N/A 11/23/2012   Procedure: WIDE LOCAL EXCISION OF THE VULVA;  Surgeon: Imagene Gurney A. Alycia Rossetti, MD;  Location: [facility];  Service: Gynecology;  Laterality: N/A;  left inferior vulvar biopsy excision left superior lesion left inferior vulvar excision  . VULVECTOMY N/A 08/02/2013   Procedure: WIDE LOCAL  EXCISION  VULVA;  Surgeon: Imagene Gurney A. Alycia Rossetti, MD;  Location: [facility];  Service: Gynecology;  Laterality: N/A;  . VULVECTOMY Left 03/29/2014   Procedure: WIDE LOCAL EXCISION OF LEFT VULVA ;  Surgeon: Imagene Gurney A. Alycia Rossetti, MD;  Location: [facility];  Service: Gynecology;  Laterality: Left;  Marland Kitchen VULVECTOMY Bilateral 01/24/2015   Procedure: WIDE LOCAL EXCISION VULVA;  Surgeon: [name], MD;  Location: [facility];  Service: Gynecology;  Laterality: Bilateral;  . VULVECTOMY PARTIAL  07-16-2010    There were no vitals filed for this visit.   Subjective Assessment - 01/11/20 0943    Subjective Pt states that her shoulder was not too sore.  Pertinent History "[place]";  Left total shoulder replacement 10/25/19;  TKR had multiple infections; right hand dominant;  MD 8/30;  her husband had previous PT at this clinic with [facility]    Currently in Pain? No/denies                             [facility] Adult PT Treatment/Exercise - 01/11/20 0001      Shoulder Exercises: Seated   Flexion AAROM;Left;15 reps    Flexion Limitations slide on table; repeated in scaption x15 reps on table     Other Seated Exercises Lt tricep extension red TB 2x15 reps; Lt bicep curls 2x10 reps #3    Other Seated Exercises Lt isometric hold 5x5 sec hold: flexion, ER, IR      Cryotherapy   Number Minutes Cryotherapy 10 Minutes    Cryotherapy Location Shoulder    Type of Cryotherapy Ice pack      Manual Therapy   Manual therapy comments active assit into scaption x5 reps     Passive ROM ER, IR, flexion, scaption 20x each  pt semi reclined                    PT Education - 01/11/20 1104    Education Details technique with therex    Person(s) Educated Patient    Methods Explanation;Verbal cues;Tactile cues    Comprehension Verbalized understanding;Returned demonstration            PT Short Term Goals - 12/27/19 1905      PT SHORT TERM GOAL #1   Title The patient will  demonstrate regular compliance of initial ROM and strengthing HEP    Time 4    Period Weeks    Status New    Target Date 01/24/20      PT SHORT TERM GOAL #2   Title The patient will report a 30% improvement in functional use of left UE to turn faucets on/off, flush the toilet and reach opposite side to put on deodorant    Time 4    Period Weeks    Status New      PT SHORT TERM GOAL #3   Title Left shoulder active assisted elevation to 125 degrees in semi-reclined position    Time 4    Period Weeks    Status New             PT Long Term Goals - 12/27/19 1911      PT LONG TERM GOAL #1   Title The patient will be independent in safe self progression of HEP    Time 8    Period Weeks    Status New    Target Date 02/21/20      PT LONG TERM GOAL #2   Title The patient will report a 50% improvement in functional use of left UE and pain level for light cooking, grooming and dressing tasks    Time 8    Period Weeks    Status New      PT LONG TERM GOAL #3   Title The patient will 100 degrees of seated or standing elevation needed for reaching into lower kitchen cabinets    Time 8    Period Weeks    Status New      PT LONG TERM GOAL #4   Title The patient will have improved left shoulder strength and elbow strength to 3+/5 needed to lift the tea  pitcher into the lower shelf of the fridge (with 2 hands)    Time 8    Period Weeks    Status New      PT LONG TERM GOAL #5   Title FOTO functional outcome score improved from 73% limitation to 40% indicating improved function and pain    Time 8    Period Weeks    Status New                 Plan - 01/11/20 1056    Clinical Impression Statement Today's session continued with therex to promote shoulder strength and ROM. Pt denied soreness following her last session. She required PT cuing with active external rotation ROM and isometrics but denied pain with all these exercises. Pt was able to actively raise her Lt arm to 90  deg in a reclined position, but she required PT assistance for the last 5-10 degrees of motion. Will continue with current POC.    Personal Factors and Comorbidities Age;Fitness;Comorbidity 1;Comorbidity 2;Comorbidity 3+    Comorbidities osteoporosis; asthma; fibromyalgia, depression; cardiac; left LE multiple knee surgeries related to infection    Examination-Activity Limitations Bathing;Reach Overhead;Carry;Lift;Hygiene/Grooming;Dressing;Toileting    Examination-Participation Restrictions Meal Prep;Cleaning;Shop;Laundry    Stability/Clinical Decision Making Evolving/Moderate complexity    Rehab Potential Good    PT Frequency 2x / week    PT Duration 8 weeks    PT Treatment/Interventions ADLs/Self Care Home Management;Cryotherapy;Electrical Stimulation;Ultrasound;Moist Heat;Therapeutic exercise;Therapeutic activities;Patient/family education;Manual techniques;Taping;Vasopneumatic Device;Neuromuscular re-education    PT Next Visit Plan P/AAROM LT shoulder scap stabilization supine and seated    PT Home Exercise Plan [id number]    Consulted and Agree with Plan of Care Patient           Patient will benefit from skilled therapeutic intervention in order to improve the following deficits and impairments:  Pain, Impaired UE functional use, Decreased range of motion, Decreased strength, Impaired perceived functional ability  Visit Diagnosis: Stiffness of left shoulder, not elsewhere classified  Acute pain of left shoulder  Muscle weakness (generalized)  Localized edema     Problem List Patient Active Problem List   Diagnosis Date Noted  . S/P reverse total shoulder arthroplasty, left 10/25/2019  . History of adenomatous polyp of colon 09/30/2018  . Shortness of breath   . ACS (acute coronary syndrome) ([place]) 04/11/2017  . Chest pain, rule out acute myocardial infarction 04/10/2017  . Bigeminy 04/10/2017  . Chronic cystitis 04/09/2017  . Thrombocytopenic disorder ([place]) 02/25/2017  .  Osteoporosis 12/15/2016  . 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 Incomplete emptying of bladder 07/30/2016  . Left foot pain 12/11/2015  . 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 Stopped smoking with greater than 40 pack year history 10/23/2015  . 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 Mixed incontinence urge and stress 09/16/2011  . Cancer of right breast, stage 2 ([place]) 05/28/2011  . 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 Hypothyroid 05/28/2011  . GERD (gastroesophageal reflux disease) 05/28/2011  . Fibromyalgia 05/28/2011  . 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 Mild depression ([place]) 05/28/2011  . Thrombocytopenia, unspecified ([place]) 05/28/2011  . Morbid obesity ([place]) 04/10/2011  . GERD 11/09/2009  . ALLERGIC RHINITIS 08/23/2007  . ASTHMA 08/23/2007  . COUGH 08/23/2007  . SKIN CANCER, HX OF 08/23/2007    11:20 AM,01/11/20 Sherol Dade PT, DPT [facility] [address], [place], [place], [address] Phone: [phone]   Fax:  [phone]  Name: KELISHA DALL MRN: [id number] Date of Birth: [birth year]

## 2020-01-13 ENCOUNTER — Encounter: Payer: Medicare HMO | Admitting: Physical Therapy

## 2020-01-16 ENCOUNTER — Encounter: Payer: Medicare HMO | Admitting: Physical Therapy

## 2020-01-18 ENCOUNTER — Ambulatory Visit: Payer: Medicare HMO | Admitting: Physical Therapy

## 2020-01-18 ENCOUNTER — Other Ambulatory Visit: Payer: Self-pay

## 2020-01-18 ENCOUNTER — Encounter: Payer: Self-pay | Admitting: Physical Therapy

## 2020-01-18 DIAGNOSIS — M6281 Muscle weakness (generalized): Secondary | ICD-10-CM

## 2020-01-18 DIAGNOSIS — M25612 Stiffness of left shoulder, not elsewhere classified: Secondary | ICD-10-CM

## 2020-01-18 DIAGNOSIS — M25512 Pain in left shoulder: Secondary | ICD-10-CM

## 2020-01-18 DIAGNOSIS — R600 Localized edema: Secondary | ICD-10-CM

## 2020-01-18 NOTE — Therapy (Signed)
[facility] [address] [place], [place], [address] Phone: [phone]   Fax:  [phone]  Physical Therapy Treatment  Patient Details  Name: [name] MRN: [id number] Date of Birth: November 28, 1945 Referring Provider (PT): Dr. Marlou Sa    Encounter Date: 01/18/2020   PT End of Session - 01/18/20 1407    Visit Number 7    Date for PT Re-Evaluation 02/21/20    Authorization Type [employer]    PT Start Time 1572   pt late then extended period in rest room   PT Stop Time 1457    PT Time Calculation (min) 50 min    Activity Tolerance Patient tolerated treatment well;No increased pain    Behavior During Therapy WFL for tasks assessed/performed           Past Medical History:  Diagnosis Date  . ACS (acute coronary syndrome) ([place]) 04/11/2017  . ALLERGIC RHINITIS 08/23/2007   Qualifier: Diagnosis of  By: Doy Mince LPN, Megan    . Anxiety   . Arthritis   . Asthma   . ASTHMA 08/23/2007   Qualifier: Diagnosis of  By: Doy Mince LPN, Megan    . Bigeminy 04/10/2017  . 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 Cancer of right breast, stage 2 ([facility]) 05/28/2011   2.8 cm invasive 1 node pos ER pos S/P mastectomy 06/1995 then AC x 4 then Tam X 5   . Chest pain, rule out acute myocardial infarction 04/10/2017  . Chronic constipation   . Chronic cystitis 04/09/2017  . Cough 08/23/2007   Qualifier: Diagnosis of  By: Gwenette Greet MD, Armando Reichert   . Depression   . 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 Dysrhythmia 2020   Had Afib  . 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 Fibromyalgia   . GERD (gastroesophageal reflux disease)   . History of adenomatous polyp of colon 09/30/2018  . History of breast cancer no recurrence   1997  right breast cancer  s/p  mastectomy w/ snl dissection and chemoradiation/  2005  left breast cancer DCIS   s/p  lumpectomy and radiation  . History of colon polyps    2007 hyperplagia  . History of esophageal dilatation    2008  . History of peptic ulcer   . History of small bowel obstruction    2012  . Hypothyroid 05/28/2011  . Hypothyroidism   . Incomplete emptying of bladder 07/30/2016  . Left foot pain 12/11/2015   Overview:  Dr Cyril Mourning [facility]  . Macular degeneration of right eye   . Mild depression ([place]) 05/28/2011  . Mixed incontinence urge and stress 09/16/2011   S/p lumax desires surgery   . Morbid obesity ([place]) 04/10/2011  . Numbness of left foot    4 toes  . Osteoporosis 12/15/2016   Overview:  Dr Cletis Media  . Paget's disease of vulva   . Personal history of chemotherapy 1997  . Personal history of radiation therapy 2005  . PONV (postoperative nausea and vomiting)    and HARD TO WAKE  . Seasonal allergies   . Shortness of breath   . SKIN CANCER, HX OF 08/23/2007   Qualifier: Diagnosis of  By: Doy Mince LPN, Megan    . SUI (stress urinary incontinence, female)   . Thrombocytopenic disorder ([place]) 02/25/2017  . Wears glasses     Past Surgical History:  Procedure Laterality Date  .  BLADDER SUSPENSION  10/13/2011   Procedure: TRANSVAGINAL TAPE (TVT) PROCEDURE;  Surgeon: Delice Lesch, MD;  Location: [facility];  Service: Gynecology;  Laterality: N/A;  . BREAST BIOPSY Left 03/19/2004  . BREAST LUMPECTOMY Left 03-27-2004  . CARDIAC CATHETERIZATION  10-30-2000  dr Wynonia Lawman   normal coronary arteries and LVF/  ef 65-70%  . CLOSED MANIPULATION  W/ OPEN REDUCTION EXCHANGE TIBIAL FEMORAL BEARING POST TOTAL LEFT KNEE  04-14-2001  . CYSTOSCOPY  10/13/2011   Procedure: CYSTOSCOPY;  Surgeon: Delice Lesch, MD;  Location: [facility];  Service: Gynecology;  Laterality: Bilateral;  . DIAGNOSTIC LAPAROSCOPY     Colon d/t SBO  . DILATION AND CURETTAGE OF UTERUS  1968  . FOOT SURGERY Left 02-01-2009   TRIPLE ARTHRODESIS  . FOOT SURGERY Left 11/14   I&D left foot with woundvac placement  .  INCISIONAL HERNIA REPAIR  05/05/2011   Procedure: LAPAROSCOPIC INCISIONAL HERNIA;  Surgeon: Edward Jolly, MD;  Location: [facility];  Service: General;  Laterality: N/A;  LAPAROSCOPIC REPAIR INCARCERATED INCISIONAL HERNIA  WITH MESH  . KNEE ARTHROSCOPY Bilateral left [phone] & 04-1992/   right [phone]  . LAPAROSCOPIC CHOLECYSTECTOMY  06-21-2010  . LEFT HEART CATH AND CORONARY ANGIOGRAPHY N/A 04/13/2017   Procedure: LEFT HEART CATH AND CORONARY ANGIOGRAPHY;  Surgeon: Jettie Booze, MD;  Location: [facility];  Service: Cardiovascular;  Laterality: N/A;  . LUMBAR SPINE SURGERY  1998   L5 -- S1  . MASTECTOMY Right 1997   W/  LYMPH NODE DISSECTION AND RECONSTRUCTION WITH IMPLANTS AND EXPANDER  . NEGATIVE SLEEP STUDY  12/26/2015  . PORT-A-CATH PLACEMENT AND REMOVAL  1997  . REMOVAL RIGHT BREAST IMPLANT AND CAPSULECTOMY  06-03-1999  . REVERSE SHOULDER ARTHROPLASTY Left 10/25/2019   Procedure: LEFT REVERSE SHOULDER ARTHROPLASTY;  Surgeon: Meredith Pel, MD;  Location: [facility];  Service: Orthopedics;  Laterality: Left;  . REVISION TOTAL KNEE ARTHROPLASTY Left 07-07-2005  &  12-10-2001   12-10-2001  REMOVAL TOTAL KNEE/ I&D / INSERTION ANTIBIOTIC SPACER  . REVISION TOTAL KNEE ARTHROPLASTY Right 07-07-2005  . ROTATOR CUFF REPAIR Right   . TENDON REPAIR RIGHT RING FINGER  2011  . THUMB TRIGGER RELEASE Right 1993  . THYROIDECTOMY, PARTIAL  1983  . TONSILLECTOMY  1968  . TOTAL KNEE ARTHROPLASTY Bilateral right 05-04-2000/  left 03-29-2001  . VULVECTOMY  05/27/2012   Procedure: WIDE EXCISION VULVECTOMY;  Surgeon: Imagene Gurney A. Alycia Rossetti, MD;  Location: [facility];  Service: Gynecology;  Laterality: N/A;  Wide Local Excision of Vulva   . VULVECTOMY N/A 11/23/2012   Procedure: WIDE LOCAL EXCISION OF THE VULVA;  Surgeon: Imagene Gurney A. Alycia Rossetti, MD;  Location: [facility];  Service: Gynecology;  Laterality: N/A;  left inferior vulvar biopsy excision left superior lesion left inferior vulvar excision  . VULVECTOMY  N/A 08/02/2013   Procedure: WIDE LOCAL  EXCISION VULVA;  Surgeon: Imagene Gurney A. Alycia Rossetti, MD;  Location: [facility];  Service: Gynecology;  Laterality: N/A;  . VULVECTOMY Left 03/29/2014   Procedure: WIDE LOCAL EXCISION OF LEFT VULVA ;  Surgeon: Imagene Gurney A. Alycia Rossetti, MD;  Location[facility];  Service: Gynecology;  Laterality: Left;  Marland Kitchen VULVECTOMY Bilateral 01/24/2015   Procedure: WIDE LOCAL EXCISION VULVA;  Surgeon: [name], MD;  Location: [place];  Service: Gynecology;  Laterality: Bilateral;  . VULVECTOMY PARTIAL  07-16-2010    There were no vitals filed for this visit.   Subjective Assessment - 01/18/20 1411    Subjective No  new complaints, can get my tea pitcher up in fridge a littel easier. No current pain    Pertinent History "[place]";  Left total shoulder replacement 10/25/19;  TKR had multiple infections; right hand dominant;  MD 8/30;  her husband had previous PT at this clinic with [facility]    Currently in Pain? No/denies                             [facility] Adult PT Treatment/Exercise - 01/18/20 0001      Shoulder Exercises: Supine   Flexion AAROM;Left   3x10 with PTA asst   Shoulder Flexion Weight (lbs) repeat with Bil UE using cane 20x   added 1# on 3rd set   Flexion Limitations incline     Other Supine Exercises manually resisted ER 4x10      Shoulder Exercises: Seated   Row Strengthening;Both;20 reps;Theraband   3x10   Theraband Level (Shoulder Row) Level 2 (Red)    Other Seated Exercises UE ranger 20x each direction. VC TC for scap depression      Shoulder Exercises: Sidelying   ABduction PROM;AROM;Left   3x10     Shoulder Exercises: Standing   Other Standing Exercises Finger ladder 3x to #10 with PTA guiding scap.      Cryotherapy   Number Minutes Cryotherapy 10 Minutes    Cryotherapy Location Shoulder    Type of Cryotherapy Ice pack      Manual Therapy   Passive ROM ER, IR, flexion, scaption 20x each  pt semi reclined                        PT Short Term Goals - 12/27/19 1905      PT SHORT TERM GOAL #1   Title The patient will demonstrate regular compliance of initial ROM and strengthing HEP    Time 4    Period Weeks    Status New    Target Date 01/24/20      PT SHORT TERM GOAL #2   Title The patient will report a 30% improvement in functional use of left UE to turn faucets on/off, flush the toilet and reach opposite side to put on deodorant    Time 4    Period Weeks    Status New      PT SHORT TERM GOAL #3   Title Left shoulder active assisted elevation to 125 degrees in semi-reclined position    Time 4    Period Weeks    Status New             PT Long Term Goals - 12/27/19 1911      PT LONG TERM GOAL #1   Title The patient will be independent in safe self progression of HEP    Time 8    Period Weeks    Status New    Target Date 02/21/20      PT LONG TERM GOAL #2   Title The patient will report a 50% improvement in functional use of left UE and pain level for light cooking, grooming and dressing tasks    Time 8    Period Weeks    Status New      PT LONG TERM GOAL #3   Title The patient will 100 degrees of seated or standing elevation needed for reaching into lower kitchen cabinets    Time 8    Period Weeks  Status New      PT LONG TERM GOAL #4   Title The patient will have improved left shoulder strength and elbow strength to 3+/5 needed to lift the tea pitcher into the lower shelf of the fridge (with 2 hands)    Time 8    Period Weeks    Status New      PT LONG TERM GOAL #5   Title FOTO functional outcome score improved from 73% limitation to 40% indicating improved function and pain    Time 8    Period Weeks    Status New                 Plan - 01/18/20 1408    Clinical Impression Statement Pt reports no pain today and in general low level to no pain. Pt reports putting her tea pitcher up in the fridge is getting easier. pt still struggles with  AROM in sitting and requires tactile cuing to inhibit the upper trap. No pain with exercises.    Personal Factors and Comorbidities Age;Fitness;Comorbidity 1;Comorbidity 2;Comorbidity 3+    Comorbidities osteoporosis; asthma; fibromyalgia, depression; cardiac; left LE multiple knee surgeries related to infection    Examination-Activity Limitations Bathing;Reach Overhead;Carry;Lift;Hygiene/Grooming;Dressing;Toileting    Examination-Participation Restrictions Meal Prep;Cleaning;Shop;Laundry    Stability/Clinical Decision Making Evolving/Moderate complexity    Rehab Potential Good    PT Frequency 2x / week    PT Treatment/Interventions ADLs/Self Care Home Management;Cryotherapy;Electrical Stimulation;Ultrasound;Moist Heat;Therapeutic exercise;Therapeutic activities;Patient/family education;Manual techniques;Taping;Vasopneumatic Device;Neuromuscular re-education    PT Next Visit Plan P/AAROM LT shoulder scap stabilization supine and seated    PT Home Exercise Plan [id number]    Consulted and Agree with Plan of Care Patient           Patient will benefit from skilled therapeutic intervention in order to improve the following deficits and impairments:  Pain, Impaired UE functional use, Decreased range of motion, Decreased strength, Impaired perceived functional ability  Visit Diagnosis: Stiffness of left shoulder, not elsewhere classified  Acute pain of left shoulder  Muscle weakness (generalized)  Localized edema     Problem List Patient Active Problem List   Diagnosis Date Noted  . S/P reverse total shoulder arthroplasty, left 10/25/2019  . History of adenomatous polyp of colon 09/30/2018  . Shortness of breath   . ACS (acute coronary syndrome) ([place]) 04/11/2017  . Chest pain, rule out acute myocardial infarction 04/10/2017  . Bigeminy 04/10/2017  . Chronic cystitis 04/09/2017  . Thrombocytopenic disorder ([place]) 02/25/2017  . Osteoporosis 12/15/2016  . Bladder neoplasm of uncertain  malignant potential 09/11/2016  . Incomplete emptying of bladder 07/30/2016  . Left foot pain 12/11/2015  . 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 Stopped smoking with greater than 40 pack year history 10/23/2015  . 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 Mixed incontinence urge and stress 09/16/2011  . Cancer of right breast, stage 2 ([place]) 05/28/2011  . 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 Hypothyroid 05/28/2011  . GERD (gastroesophageal reflux disease) 05/28/2011  . Fibromyalgia 05/28/2011  . 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 Mild depression ([place]) 05/28/2011  . Thrombocytopenia, unspecified ([place]) 05/28/2011  . Morbid obesity ([place]) 04/10/2011  . GERD 11/09/2009  . ALLERGIC RHINITIS 08/23/2007  . ASTHMA 08/23/2007  . COUGH 08/23/2007  . SKIN CANCER, HX OF 08/23/2007    Sianne Tejada, PTA 01/18/2020, 3:30 PM  [facility] [address] [place],  [place], [address] Phone: [phone]   Fax:  [phone]  Name: [name] MRN: [id number] Date of Birth: 1945/09/01

## 2020-01-20 ENCOUNTER — Encounter: Payer: Self-pay | Admitting: Physical Therapy

## 2020-01-20 ENCOUNTER — Ambulatory Visit: Payer: Medicare HMO | Admitting: Physical Therapy

## 2020-01-20 ENCOUNTER — Telehealth: Payer: Self-pay | Admitting: *Deleted

## 2020-01-20 ENCOUNTER — Other Ambulatory Visit: Payer: Self-pay

## 2020-01-20 DIAGNOSIS — M25512 Pain in left shoulder: Secondary | ICD-10-CM

## 2020-01-20 DIAGNOSIS — M25612 Stiffness of left shoulder, not elsewhere classified: Secondary | ICD-10-CM

## 2020-01-20 DIAGNOSIS — R600 Localized edema: Secondary | ICD-10-CM

## 2020-01-20 DIAGNOSIS — I493 Ventricular premature depolarization: Secondary | ICD-10-CM

## 2020-01-20 DIAGNOSIS — M6281 Muscle weakness (generalized): Secondary | ICD-10-CM

## 2020-01-20 NOTE — Therapy (Addendum)
[facility] [address] [place], [place], [address] Phone: [phone]   Fax:  [phone]  Physical Therapy Treatment/Discharge Summary   Patient Details  Name: ANYAH SWALLOW MRN: [id number] Date of Birth: 06-22-1945 Referring Provider (PT): Dr. Marlou Sa    Encounter Date: 01/20/2020   PT End of Session - 01/20/20 1023    Visit Number 8    Date for PT Re-Evaluation 02/21/20    Authorization Type [employer]    PT Start Time 1020    PT Stop Time 1110    PT Time Calculation (min) 50 min    Activity Tolerance Patient tolerated treatment well    Behavior During Therapy [facility] for tasks assessed/performed           Past Medical History:  Diagnosis Date  . ACS (acute coronary syndrome) ([place]) 04/11/2017  . ALLERGIC RHINITIS 08/23/2007   Qualifier: Diagnosis of  By: Doy Mince LPN, Megan    . Anxiety   . Arthritis   . Asthma   . ASTHMA 08/23/2007   Qualifier: Diagnosis of  By: Doy Mince LPN, Megan    . Bigeminy 04/10/2017  . 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 Cancer of right breast, stage 2 ([place]) 05/28/2011   2.8 cm invasive 1 node pos ER pos S/P mastectomy 06/1995 then AC x 4 then Tam X 5   . Chest pain, rule out acute myocardial infarction 04/10/2017  . Chronic constipation   . Chronic cystitis 04/09/2017  . Cough 08/23/2007   Qualifier: Diagnosis of  By: Gwenette Greet MD, Armando Reichert   . Depression   . 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 Dysrhythmia 2020   Had Afib  . 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 Fibromyalgia   . GERD (gastroesophageal reflux disease)   . History of adenomatous polyp of colon 09/30/2018  . 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 History of  colon polyps    2007 hyperplagia  . History of esophageal dilatation    2008  . History of peptic ulcer   . History of small bowel obstruction    2012  . Hypothyroid 05/28/2011  . Hypothyroidism   . Incomplete emptying of bladder 07/30/2016  . Left foot pain 12/11/2015   Overview:  Dr Cyril Mour[facility]  . Macular degeneration of right eye   . Mild depression ([place]) 05/28/2011  . Mixed incontinence urge and stress 09/16/2011   S/p lumax desires surgery   . Morbid obesity ([place]) 04/10/2011  . Numbness of left foot    4 toes  . Osteoporosis 12/15/2016   Overview:  Dr Cletis Media  . Paget's disease of vulva   . Personal history of chemotherapy 1997  . Personal history of radiation therapy 2005  . PONV (postoperative nausea and vomiting)    and HARD TO WAKE  . Seasonal allergies   . Shortness of breath   . SKIN CANCER, HX OF 08/23/2007   Qualifier: Diagnosis of  By: Doy Mince LPN, Megan    . SUI (stress urinary incontinence, female)   . Thrombocytopenic disorder ([place]) 02/25/2017  . Wears glasses     Past Surgical History:  Procedure Laterality Date  . BLADDER SUSPENSION  10/13/2011   Procedure: TRANSVAGINAL  TAPE (TVT) PROCEDURE;  Surgeon: Delice Lesch, MD;  Location: [facility];  Service: Gynecology;  Laterality: N/A;  . BREAST BIOPSY Left 03/19/2004  . BREAST LUMPECTOMY Left 03-27-2004  . CARDIAC CATHETERIZATION  10-30-2000  dr Wynonia Lawman   normal coronary arteries and LVF/  ef 65-70%  . CLOSED MANIPULATION  W/ OPEN REDUCTION EXCHANGE TIBIAL FEMORAL BEARING POST TOTAL LEFT KNEE  04-14-2001  . CYSTOSCOPY  10/13/2011   Procedure: CYSTOSCOPY;  Surgeon: Delice Lesch, MD;  Location: [facility];  Service: Gynecology;  Laterality: Bilateral;  . DIAGNOSTIC LAPAROSCOPY     Colon d/t SBO  . DILATION AND CURETTAGE OF UTERUS  1968  . FOOT SURGERY Left 02-01-2009   TRIPLE ARTHRODESIS  . FOOT SURGERY Left 11/14   I&D left foot with woundvac placement  . INCISIONAL HERNIA REPAIR  05/05/2011   Procedure:  LAPAROSCOPIC INCISIONAL HERNIA;  Surgeon: Edward Jolly, MD;  [facility];  Service: General;  Laterality: N/A;  LAPAROSCOPIC REPAIR INCARCERATED INCISIONAL HERNIA  WITH MESH  . KNEE ARTHROSCOPY Bilateral left [phone] & 04-1992/   right [phone]  . LAPAROSCOPIC CHOLECYSTECTOMY  06-21-2010  . LEFT HEART CATH AND CORONARY ANGIOGRAPHY N/A 04/13/2017   Procedure: LEFT HEART CATH AND CORONARY ANGIOGRAPHY;  Surgeon: Jettie Booze, MD;  Location: [place] [facility];  Service: Cardiovascular;  Laterality: N/A;  . LUMBAR SPINE SURGERY  1998   L5 -- S1  . MASTECTOMY Right 1997   W/  LYMPH NODE DISSECTION AND RECONSTRUCTION WITH IMPLANTS AND EXPANDER  . NEGATIVE SLEEP STUDY  12/26/2015  . PORT-A-CATH PLACEMENT AND REMOVAL  1997  . REMOVAL RIGHT BREAST IMPLANT AND CAPSULECTOMY  06-03-1999  . REVERSE SHOULDER ARTHROPLASTY Left 10/25/2019   Procedure: LEFT REVERSE SHOULDER ARTHROPLASTY;  Surgeon: Meredith Pel, MD;  Location: [place];  Service: Orthopedics;  Laterality: Left;  . REVISION TOTAL KNEE ARTHROPLASTY Left 07-07-2005  &  12-10-2001   12-10-2001  REMOVAL TOTAL KNEE/ I&D / INSERTION ANTIBIOTIC SPACER  . REVISION TOTAL KNEE ARTHROPLASTY Right 07-07-2005  . ROTATOR CUFF REPAIR Right   . TENDON REPAIR RIGHT RING FINGER  2011  . THUMB TRIGGER RELEASE Right 1993  . THYROIDECTOMY, PARTIAL  1983  . TONSILLECTOMY  1968  . TOTAL KNEE ARTHROPLASTY Bilateral right 05-04-2000/  left 03-29-2001  . VULVECTOMY  05/27/2012   Procedure: WIDE EXCISION VULVECTOMY;  Surgeon: Imagene Gurney A. Alycia Rossetti, MD;  [facility]: [facility];  Service: Gynecology;  Laterality: N/A;  Wide Local Excision of Vulva   . VULVECTOMY N/A 11/23/2012   Procedure: WIDE LOCAL EXCISION OF THE VULVA;  Surgeon: Imagene Gurney A. Alycia Rossetti, MD;  [facility];  Service: Gynecology;  Laterality: N/A;  left inferior vulvar biopsy excision left superior lesion left inferior vulvar excision  . VULVECTOMY N/A 08/02/2013   Procedure: WIDE LOCAL  EXCISION  VULVA;  Surgeon: Imagene Gurney A. Alycia Rossetti, MD;  [facility];  Service: Gynecology;  Laterality: N/A;  . VULVECTOMY Left 03/29/2014   Procedure: WIDE LOCAL EXCISION OF LEFT VULVA ;  Surgeon: Imagene Gurney A. Alycia Rossetti, MD;  Location: [facility];  Service: Gynecology;  Laterality: Left;  Marland Kitchen VULVECTOMY Bilateral 01/24/2015   Procedure: WIDE LOCAL EXCISION VULVA;  Surgeon: [name], MD;  Location: [place];  Service: Gynecology;  Laterality: Bilateral;  . VULVECTOMY PARTIAL  07-16-2010    There were no vitals filed for this visit.   Subjective Assessment - 01/20/20 1026    Subjective No pain this AM. Did fine after last session.  I see the MD on Monday    Pertinent History "Lannie";  Left total shoulder replacement 10/25/19;  TKR had multiple infections; right hand dominant;  MD 8/30;  her husband had previous PT at this clinic with [facility]    Currently in Pain? No/denies    Multiple Pain Sites No              [facility] PT Assessment - 01/20/20 0001      AROM   Left Shoulder Flexion 70 Degrees    with hands clasped and no scap elevation   Left Shoulder ABduction 0 Degrees    Left Shoulder External Rotation --   Functionally can touch earlobe     PROM   Overall PROM Comments in supine propped on wedge with cane and clasped hand assist 100 degrees                          [facility] Adult PT Treatment/Exercise - 01/20/20 0001      Shoulder Exercises: Supine   Flexion AROM;AAROM;Strengthening;Both;Weights   3x10 of AROM   Shoulder Flexion Weight (lbs) 1   on cane 2x15   Flexion Limitations incline       Shoulder Exercises: Seated   Other Seated Exercises UE ranger 20x each direction. VC TC for scap depression    Other Seated Exercises Scap squuezes 10x 5 sec hold   Green band rows 3x10     Shoulder Exercises: Pulleys   Flexion 3 minutes      Shoulder Exercises: Stretch   Other Shoulder Stretches inclined with cane flexion overhead stretch 5x 10 sec    Reviewd for how to do at home to gain more ROM     Cryotherapy   Number Minutes Cryotherapy 10 Minutes    Cryotherapy Location Shoulder    Type of Cryotherapy Ice pack      Manual Therapy   Passive ROM ,AROM & manually resisted ER 10x3  in incline                    PT Short Term Goals - 12/27/19 1905      PT SHORT TERM GOAL #1   Title The patient will demonstrate regular compliance of initial ROM and strengthing HEP    Time 4    Period Weeks    Status New    Target Date 01/24/20      PT SHORT TERM GOAL #2   Title The patient will report a 30% improvement in functional use of left UE to turn faucets on/off, flush the toilet and reach opposite side to put on deodorant    Time 4    Period Weeks    Status New      PT SHORT TERM GOAL #3   Title Left shoulder active assisted elevation to 125 degrees in semi-reclined position    Time 4    Period Weeks    Status New             PT Long Term Goals - 12/27/19 1911      PT LONG TERM GOAL #1   Title The patient will be independent in safe self progression of HEP    Time 8    Period Weeks    Status New    Target Date 02/21/20      PT LONG TERM GOAL #2   Title The patient will report a 50% improvement in functional use of left UE and  pain level for light cooking, grooming and dressing tasks    Time 8    Period Weeks    Status New      PT LONG TERM GOAL #3   Title The patient will 100 degrees of seated or standing elevation needed for reaching into lower kitchen cabinets    Time 8    Period Weeks    Status New      PT LONG TERM GOAL #4   Title The patient will have improved left shoulder strength and elbow strength to 3+/5 needed to lift the tea pitcher into the lower shelf of the fridge (with 2 hands)    Time 8    Period Weeks    Status New      PT LONG TERM GOAL #5   Title FOTO functional outcome score improved from 73% limitation to 40% indicating improved function and pain    Time 8    Period Weeks     Status New                 Plan - 01/20/20 1035    Clinical Impression Statement Pt arrives with no pain. Pt demonstrates improving GH movement with less scapular elevation. Pt still requires to be positioned semi-reclined to best perform her exercises that involve lifting of the LTUE. Pt can lift arm in sitting with assistance of RTUE to 70 degrees. No pain during todays session.    Personal Factors and Comorbidities Age;Fitness;Comorbidity 1;Comorbidity 2;Comorbidity 3+    Comorbidities osteoporosis; asthma; fibromyalgia, depression; cardiac; left LE multiple knee surgeries related to infection    Examination-Activity Limitations Bathing;Reach Overhead;Carry;Lift;Hygiene/Grooming;Dressing;Toileting    Examination-Participation Restrictions Meal Prep;Cleaning;Shop;Laundry    Stability/Clinical Decision Making Evolving/Moderate complexity    Rehab Potential Good    PT Frequency 2x / week    PT Treatment/Interventions ADLs/Self Care Home Management;Cryotherapy;Electrical Stimulation;Ultrasound;Moist Heat;Therapeutic exercise;Therapeutic activities;Patient/family education;Manual techniques;Taping;Vasopneumatic Device;Neuromuscular re-education    PT Next Visit Plan P/AAROM LT shoulder scap stabilization supine and seated    PT Home Exercise Plan [id number]    Consulted and Agree with Plan of Care Patient           Patient will benefit from skilled therapeutic intervention in order to improve the following deficits and impairments:  Pain, Impaired UE functional use, Decreased range of motion, Decreased strength, Impaired perceived functional ability  Visit Diagnosis: Stiffness of left shoulder, not elsewhere classified  Acute pain of left shoulder  Muscle weakness (generalized)  Localized edema    PHYSICAL THERAPY DISCHARGE SUMMARY  Visits from Start of Care: 8  Current functional level related to goals / functional outcomes: The patient called to request discharge and  cancelled her remaining appts.  She states she is "going to wait to see the doctor at the end of the month."  She will need a new PT order if she wishes to return.     Remaining deficits: As above   Education / Equipment: HEP Plan: Patient agrees to discharge.  Patient goals were not met. Patient is being discharged due to the patient's request.  ?????      Problem List Patient Active Problem List   Diagnosis Date Noted  . S/P reverse total shoulder arthroplasty, left 10/25/2019  . History of adenomatous polyp of colon 09/30/2018  . Shortness of breath   . ACS (acute coronary syndrome) ([place]) 04/11/2017  . Chest pain, rule out acute myocardial infarction 04/10/2017  . Bigeminy 04/10/2017  . Chronic cystitis 04/09/2017  . Thrombocytopenic disorder (  [place]) 02/25/2017  . Osteoporosis 12/15/2016  . 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 Incomplete emptying of bladder 07/30/2016  . Left foot pain 12/11/2015  . 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 Stopped smoking with greater than 40 pack year history 10/23/2015  . 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 Mixed incontinence urge and stress 09/16/2011  . Cancer of right breast, stage 2 ([place]) 05/28/2011  . 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 Hypothyroid 05/28/2011  . GERD (gastroesophageal reflux disease) 05/28/2011  . Fibromyalgia 05/28/2011  . 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 Mild depression ([place]) 05/28/2011  . Thrombocytopenia, unspecified ([place]) 05/28/2011  . Morbid obesity ([place]) 04/10/2011  . GERD 11/09/2009  . ALLERGIC RHINITIS 08/23/2007  . ASTHMA 08/23/2007  . COUGH 08/23/2007  . SKIN CANCER, HX OF 08/23/2007    Delpha Perko, PTA 01/20/2020, 5:34 PM  [facility] [address], [place], [place], [address] Phone: [phone]   Fax:  [phone]  Name:  ALEXANDRE LIGHTSEY MRN: [id number] Date of Birth: Feb 17, 1946

## 2020-01-20 NOTE — Telephone Encounter (Signed)
Pt reports that she is taking Mexiletine. Aware that she will need a 2 day monitor prior to f/u w/ Dr. Curt Bears.  9/2 OV cancelled. Agreeable to monitor.  Asking if she can pick up from office instead of mailing.  Aware I will look into this and let her know next week.  Also discussed sleep study, she reports she had one done several years ago through [place] in [place].   Aware I will look into this more.

## 2020-01-23 ENCOUNTER — Encounter: Payer: Self-pay | Admitting: Orthopedic Surgery

## 2020-01-23 ENCOUNTER — Ambulatory Visit: Payer: Medicare HMO | Admitting: Orthopedic Surgery

## 2020-01-23 VITALS — Ht 65.0 in | Wt 244.0 lb

## 2020-01-23 DIAGNOSIS — Z96612 Presence of left artificial shoulder joint: Secondary | ICD-10-CM

## 2020-01-24 ENCOUNTER — Encounter: Payer: Self-pay | Admitting: Orthopedic Surgery

## 2020-01-24 NOTE — Progress Notes (Signed)
Post-Op Visit Note   Patient: [name]           Date of Birth: 03-08-1946           MRN: [id number] Visit Date: 01/23/2020 PCP: System, Pcp Not In   Assessment & Plan:  Chief Complaint:  Chief Complaint  Patient presents with  . Left Shoulder - Follow-up    10/25/2019 Left RSA   Visit Diagnoses:  1. Status post reverse total replacement of left shoulder     Plan: [name] is a 74 year old patient who is now about 3 months out from left reverse shoulder replacement.  Overall her pain is improving but her range of motion is improving slower.  She is doing physical therapy and home exercise program every other day.  On examination she does not quite have forward flexion abduction yet over 90.  I want her to continue therapy for 4 2-4 more weeks 1-2 times a week as her finances allow.  Continue with home exercise program daily after that.  Follow-up in 3 months for final check.  Follow-Up Instructions: Return in about 3 months (around 04/24/2020).   Orders:  No orders of the defined types were placed in this encounter.  No orders of the defined types were placed in this encounter.   Imaging: No results found.  PMFS History: Patient Active Problem List   Diagnosis Date Noted  . S/P reverse total shoulder arthroplasty, left 10/25/2019  . History of adenomatous polyp of colon 09/30/2018  . Shortness of breath   . ACS (acute coronary syndrome) ([place]) 04/11/2017  . Chest pain, rule out acute myocardial infarction 04/10/2017  . Bigeminy 04/10/2017  . Chronic cystitis 04/09/2017  . Thrombocytopenic disorder ([place]) 02/25/2017  . Osteoporosis 12/15/2016  . 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 Incomplete emptying of bladder 07/30/2016  . Left foot pain 12/11/2015  . 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 Stopped smoking with greater than 40 pack year history 10/23/2015  . 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 Mixed  incontinence urge and stress 09/16/2011  . Cancer of right breast, stage 2 ([place]) 05/28/2011  . 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 Hypothyroid 05/28/2011  . GERD (gastroesophageal reflux disease) 05/28/2011  . Fibromyalgia 05/28/2011  . 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 Mild depression ([place]) 05/28/2011  . Thrombocytopenia, unspecified ([place]) 05/28/2011  . Morbid obesity ([place]) 04/10/2011  . GERD 11/09/2009  . ALLERGIC RHINITIS 08/23/2007  . ASTHMA 08/23/2007  . COUGH 08/23/2007  . SKIN CANCER, HX OF 08/23/2007   Past Medical History:  Diagnosis Date  . ACS (acute coronary syndrome) ([place]) 04/11/2017  . ALLERGIC RHINITIS 08/23/2007   Qualifier: Diagnosis of  By: Doy Mince LPN, Megan    . Anxiety   . Arthritis   . Asthma   . ASTHMA 08/23/2007   Qualifier: Diagnosis of  By: Doy Mince LPN, Megan    . Bigeminy 04/10/2017  . 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 Cancer of right breast, stage 2 ([place]) 05/28/2011   2.8 cm invasive 1 node pos ER pos S/P mastectomy 06/1995 then AC x 4 then Tam X 5   . Chest pain, rule out acute myocardial infarction 04/10/2017  . Chronic constipation   . Chronic cystitis 04/09/2017  . Cough 08/23/2007   Qualifier: Diagnosis of  By: Gwenette Greet MD, Armando Reichert   . Depression   . DJD (degenerative joint disease)  of lumbar spine 05/28/2011  . 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 Dysrhythmia 2020   Had Afib  . 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 Fibromyalgia   . GERD (gastroesophageal reflux disease)   . History of adenomatous polyp of colon 09/30/2018  . 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 History of colon polyps    2007 hyperplagia  . History of esophageal dilatation    2008  . History of peptic ulcer   . History of  small bowel obstruction    2012  . Hypothyroid 05/28/2011  . Hypothyroidism   . Incomplete emptying of bladder 07/30/2016  . Left foot pain 12/11/2015   Overview:  Dr Cyril Mourning [facility]  . Macular degeneration of right eye   . Mild depression ([place]) 05/28/2011  . Mixed incontinence urge and stress 09/16/2011   S/p lumax desires surgery   . Morbid obesity ([place]) 04/10/2011  . Numbness of left foot    4 toes  . Osteoporosis 12/15/2016   Overview:  Dr Cletis Media  . Paget's disease of vulva   . Personal history of chemotherapy 1997  . Personal history of radiation therapy 2005  . PONV (postoperative nausea and vomiting)    and HARD TO WAKE  . Seasonal allergies   . Shortness of breath   . SKIN CANCER, HX OF 08/23/2007   Qualifier: Diagnosis of  By: Doy Mince LPN, Megan    . SUI (stress urinary incontinence, female)   . Thrombocytopenic disorder ([place]) 02/25/2017  . Wears glasses     Family History  Problem Relation Age of Onset  . Cancer Father 60       lung and kidney   . Alzheimer's disease Mother   . Heart attack Maternal Grandmother   . Alcohol abuse Brother   . Breast cancer Paternal Aunt   . Breast cancer Cousin   . Breast cancer Cousin     Past Surgical History:  Procedure Laterality Date  . BLADDER SUSPENSION  10/13/2011   Procedure: TRANSVAGINAL TAPE (TVT) PROCEDURE;  Surgeon: Delice Lesch, MD;  Location: [facility];  Service: Gynecology;  Laterality: N/A;  . BREAST BIOPSY Left 03/19/2004  . BREAST LUMPECTOMY Left 03-27-2004  . CARDIAC CATHETERIZATION  10-30-2000  dr Wynonia Lawman   normal coronary arteries and LVF/  ef 65-70%  . CLOSED MANIPULATION  W/ OPEN REDUCTION EXCHANGE TIBIAL FEMORAL BEARING POST TOTAL LEFT KNEE  04-14-2001  . CYSTOSCOPY  10/13/2011   Procedure: CYSTOSCOPY;  Surgeon: Delice Lesch, MD;  Location: [facility];  Service: Gynecology;  Laterality: Bilateral;  . DIAGNOSTIC LAPAROSCOPY     Colon d/t SBO  . DILATION AND CURETTAGE OF UTERUS  1968  . FOOT SURGERY Left  02-01-2009   TRIPLE ARTHRODESIS  . FOOT SURGERY Left 11/14   I&D left foot with woundvac placement  . INCISIONAL HERNIA REPAIR  05/05/2011   Procedure: LAPAROSCOPIC INCISIONAL HERNIA;  Surgeon: Edward Jolly, MD;  [facility];  Service: General;  Laterality: N/A;  LAPAROSCOPIC REPAIR INCARCERATED INCISIONAL HERNIA  WITH MESH  . KNEE ARTHROSCOPY Bilateral left [phone] & 04-1992/   right [phone]  . LAPAROSCOPIC CHOLECYSTECTOMY  06-21-2010  . LEFT HEART CATH AND CORONARY ANGIOGRAPHY N/A 04/13/2017   Procedure: LEFT HEART CATH AND CORONARY ANGIOGRAPHY;  Surgeon: Jettie Booze, MD;  Location: [facility];  Service: Cardiovascular;  Laterality: N/A;  . LUMBAR SPINE SURGERY  1998   L5 -- S1  . MASTECTOMY Right 1997   W/  LYMPH NODE DISSECTION AND RECONSTRUCTION WITH IMPLANTS AND EXPANDER  . NEGATIVE SLEEP STUDY  12/26/2015  . PORT-A-CATH PLACEMENT AND REMOVAL  1997  . REMOVAL RIGHT BREAST IMPLANT AND CAPSULECTOMY  06-03-1999  . REVERSE SHOULDER ARTHROPLASTY Left 10/25/2019   Procedure: LEFT REVERSE SHOULDER ARTHROPLASTY;  Surgeon: Meredith Pel, MD;  Location: [facility];  Service: Orthopedics;  Laterality: Left;  . REVISION TOTAL KNEE ARTHROPLASTY Left 07-07-2005  &  12-10-2001   12-10-2001  REMOVAL TOTAL KNEE/ I&D / INSERTION ANTIBIOTIC SPACER  . REVISION TOTAL KNEE ARTHROPLASTY Right 07-07-2005  . ROTATOR CUFF REPAIR Right   . TENDON REPAIR RIGHT RING FINGER  2011  . THUMB TRIGGER RELEASE Right 1993  . THYROIDECTOMY, PARTIAL  1983  . TONSILLECTOMY  1968  . TOTAL KNEE ARTHROPLASTY Bilateral right 05-04-2000/  left 03-29-2001  . VULVECTOMY  05/27/2012   Procedure: WIDE EXCISION VULVECTOMY;  Surgeon: Imagene Gurney A. Alycia Rossetti, MD;  [facility];  Service: Gynecology;  Laterality: N/A;  Wide Local Excision of Vulva   . VULVECTOMY N/A 11/23/2012   Procedure: WIDE LOCAL EXCISION OF THE VULVA;  Surgeon: Imagene Gurney A. Alycia Rossetti, MD;  [facility];  Service: Gynecology;  Laterality: N/A;   left inferior vulvar biopsy excision left superior lesion left inferior vulvar excision  . VULVECTOMY N/A 08/02/2013   Procedure: WIDE LOCAL  EXCISION VULVA;  Surgeon: Imagene Gurney A. Alycia Rossetti, MD;  [facility];  Service: Gynecology;  Laterality: N/A;  . VULVECTOMY Left 03/29/2014   Procedure: WIDE LOCAL EXCISION OF LEFT VULVA ;  Surgeon: Imagene Gurney A. Alycia Rossetti, MD;  Location: [facility];  Service: Gynecology;  Laterality: Left;  Marland Kitchen VULVECTOMY Bilateral 01/24/2015   Procedure: WIDE LOCAL EXCISION VULVA;  Surgeon: [name], MD;  Location: [facility];  Service: Gynecology;  Laterality: Bilateral;  . VULVECTOMY PARTIAL  07-16-2010   Social History   Occupational History  . Not on file  Tobacco Use  . Smoking status: Former Smoker    Packs/day: 2.00    Years: 41.00    Pack years: 82.00    Types: Cigarettes    Quit date: 06/05/1999    Years since quitting: 20.6  . Smokeless tobacco: Never Used  Vaping Use  . Vaping Use: Never used  Substance and Sexual Activity  . Alcohol use: No  . Drug use: No  . Sexual activity: Not on file

## 2020-01-25 ENCOUNTER — Encounter: Payer: Medicare HMO | Admitting: Physical Therapy

## 2020-01-26 ENCOUNTER — Ambulatory Visit: Payer: Medicare HMO | Admitting: Cardiology

## 2020-01-27 ENCOUNTER — Encounter: Payer: Medicare HMO | Admitting: Physical Therapy

## 2020-02-01 ENCOUNTER — Encounter: Payer: Medicare HMO | Admitting: Physical Therapy

## 2020-02-01 NOTE — Telephone Encounter (Signed)
Apologized to pt for not following up last week, pt understandable. Aware that office will call to arrange to have holter placed in office per pt request. Aware scheduler will call her to arrange follow up w/ [place] after monitor has been completed. Patient verbalized understanding and agreeable to plan.

## 2020-02-02 ENCOUNTER — Encounter: Payer: Medicare HMO | Admitting: Physical Therapy

## 2020-02-07 ENCOUNTER — Ambulatory Visit (INDEPENDENT_AMBULATORY_CARE_PROVIDER_SITE_OTHER): Payer: Medicare HMO

## 2020-02-07 ENCOUNTER — Other Ambulatory Visit: Payer: Self-pay

## 2020-02-07 DIAGNOSIS — I493 Ventricular premature depolarization: Secondary | ICD-10-CM | POA: Diagnosis not present

## 2020-02-16 ENCOUNTER — Ambulatory Visit: Payer: Medicare HMO | Admitting: Physical Therapy

## 2020-02-28 ENCOUNTER — Encounter: Payer: Self-pay | Admitting: Cardiology

## 2020-02-28 ENCOUNTER — Ambulatory Visit (INDEPENDENT_AMBULATORY_CARE_PROVIDER_SITE_OTHER): Payer: Medicare HMO | Admitting: Cardiology

## 2020-02-28 ENCOUNTER — Other Ambulatory Visit: Payer: Self-pay

## 2020-02-28 VITALS — BP 130/80 | HR 61 | Ht 65.0 in | Wt 246.8 lb

## 2020-02-28 DIAGNOSIS — I493 Ventricular premature depolarization: Secondary | ICD-10-CM | POA: Diagnosis not present

## 2020-02-28 NOTE — Progress Notes (Signed)
Electrophysiology Office Note   Date:  02/28/2020   ID:  [name], DOB 1946-05-26, MRN [id number]  PCP:  Pcp, No  Cardiologist:  [facility] Primary Electrophysiologist:  Ellyse Rotolo Meredith Leeds, MD    Chief Complaint: PVC   History of Present Illness: [name] is a 74 y.o. female who is being seen today for the evaluation of PVC at the request of No ref. provider found. Presenting today for electrophysiology evaluation.  She has a history of diastolic heart failure, mild nonobstructive coronary artery disease by cath in 2018 and PVCs.  She had a normal Myoview in 2020.  She wore a 48-hour monitor which showed 30% PVC burden.  Today, denies symptoms of palpitations, chest pain,  orthopnea, PND, lower extremity edema, claudication, dizziness, presyncope, syncope, bleeding, or neurologic sequela. The patient is tolerating medications without difficulties.  She continues to have shortness of breath.  She does notice an improvement in her palpitations.  Since starting mexiletine, her PVC burden is now down to 8.4%.  She does continue to get short of breath with exertion.  She says that she has been taking Lasix and has been diuresing well, taking an extra dose when she feels like she is significantly volume overloaded.  Past Medical History:  Diagnosis Date  . ACS (acute coronary syndrome) ([place]) 04/11/2017  . ALLERGIC RHINITIS 08/23/2007   Qualifier: Diagnosis of  By: Doy Mince LPN, Megan    . Anxiety   . Arthritis   . Asthma   . ASTHMA 08/23/2007   Qualifier: Diagnosis of  By: Doy Mince LPN, Megan    . Bigeminy 04/10/2017  . 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 Cancer of right breast, stage 2 ([place]) 05/28/2011   2.8 cm invasive 1 node pos ER pos S/P mastectomy 06/1995 then AC x 4 then Tam X 5   . Chest pain, rule out acute myocardial infarction 04/10/2017  . Chronic constipation   . Chronic cystitis 04/09/2017  . Cough 08/23/2007   Qualifier: Diagnosis of   By: Gwenette Greet MD, Armando Reichert   . Depression   . 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 Dysrhythmia 2020   Had Afib  . 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 Fibromyalgia   . GERD (gastroesophageal reflux disease)   . History of adenomatous polyp of colon 09/30/2018  . 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 History of colon polyps    2007 hyperplagia  . History of esophageal dilatation    2008  . History of peptic ulcer   . History of small bowel obstruction    2012  . Hypothyroid 05/28/2011  . Hypothyroidism   . Incomplete emptying of bladder 07/30/2016  . Left foot pain 12/11/2015   Overview:  Dr Cyril Mourning [facility]  . Macular degeneration of right eye   . Mild depression ([place]) 05/28/2011  . Mixed incontinence urge and stress 09/16/2011   S/p lumax desires surgery   . Morbid obesity ([place]) 04/10/2011  . Numbness of left foot    4 toes  . Osteoporosis 12/15/2016   Overview:  Dr Cletis Media  . 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[place])   . Personal history of chemotherapy 1997  . Personal history of radiation therapy 2005  .  PONV (postoperative nausea and vomiting)    and HARD TO WAKE  . Seasonal allergies   . Shortness of breath   . SKIN CANCER, HX OF 08/23/2007   Qualifier: Diagnosis of  By: Doy Mince LPN, Megan    . SUI (stress urinary incontinence, female)   . Thrombocytopenic disorder ([place]) 02/25/2017  . Wears glasses    Past Surgical History:  Procedure Laterality Date  . BLADDER SUSPENSION  10/13/2011   Procedure: TRANSVAGINAL TAPE (TVT) PROCEDURE;  Surgeon: Delice Lesch, MD;  Location: [facility];  Service: Gynecology;  Laterality: N/A;  . BREAST BIOPSY Left 03/19/2004  . BREAST LUMPECTOMY Left 03-27-2004  . CARDIAC CATHETERIZATION  10-30-2000  dr Wynonia Lawman   normal  coronary arteries and LVF/  ef 65-70%  . CLOSED MANIPULATION  W/ OPEN REDUCTION EXCHANGE TIBIAL FEMORAL BEARING POST TOTAL LEFT KNEE  04-14-2001  . CYSTOSCOPY  10/13/2011   Procedure: CYSTOSCOPY;  Surgeon: Delice Lesch, MD;  Location: [facility];  Service: Gynecology;  Laterality: Bilateral;  . DIAGNOSTIC LAPAROSCOPY     Colon d/t SBO  . DILATION AND CURETTAGE OF UTERUS  1968  . FOOT SURGERY Left 02-01-2009   TRIPLE ARTHRODESIS  . FOOT SURGERY Left 11/14   I&D left foot with woundvac placement  . INCISIONAL HERNIA REPAIR  05/05/2011   Procedure: LAPAROSCOPIC INCISIONAL HERNIA;  Surgeon: Edward Jolly, MD;  Location: [facility];  Service: General;  Laterality: N/A;  LAPAROSCOPIC REPAIR INCARCERATED INCISIONAL HERNIA  WITH MESH  . KNEE ARTHROSCOPY Bilateral left [phone] & 04-1992/   right [phone]  . LAPAROSCOPIC CHOLECYSTECTOMY  06-21-2010  . LEFT HEART CATH AND CORONARY ANGIOGRAPHY N/A 04/13/2017   Procedure: LEFT HEART CATH AND CORONARY ANGIOGRAPHY;  Surgeon: Jettie Booze, MD;  Location: [facility];  Service: Cardiovascular;  Laterality: N/A;  . LUMBAR SPINE SURGERY  1998   L5 -- S1  . MASTECTOMY Right 1997   W/  LYMPH NODE DISSECTION AND RECONSTRUCTION WITH IMPLANTS AND EXPANDER  . NEGATIVE SLEEP STUDY  12/26/2015  . PORT-A-CATH PLACEMENT AND REMOVAL  1997  . REMOVAL RIGHT BREAST IMPLANT AND CAPSULECTOMY  06-03-1999  . REVERSE SHOULDER ARTHROPLASTY Left 10/25/2019   Procedure: LEFT REVERSE SHOULDER ARTHROPLASTY;  Surgeon: Meredith Pel, MD;  Location: [facility];  Service: Orthopedics;  Laterality: Left;  . REVISION TOTAL KNEE ARTHROPLASTY Left 07-07-2005  &  12-10-2001   12-10-2001  REMOVAL TOTAL KNEE/ I&D / INSERTION ANTIBIOTIC SPACER  . REVISION TOTAL KNEE ARTHROPLASTY Right 07-07-2005  . ROTATOR CUFF REPAIR Right   . TENDON REPAIR RIGHT RING FINGER  2011  . THUMB TRIGGER RELEASE Right 1993  . THYROIDECTOMY, PARTIAL  1983  . TONSILLECTOMY  1968  . TOTAL KNEE  ARTHROPLASTY Bilateral right 05-04-2000/  left 03-29-2001  . VULVECTOMY  05/27/2012   Procedure: WIDE EXCISION VULVECTOMY;  Surgeon: Imagene Gurney A. Alycia Rossetti, MD;  Location: [facility];  Service: Gynecology;  Laterality: N/A;  Wide Local Excision of Vulva   . VULVECTOMY N/A 11/23/2012   Procedure: WIDE LOCAL EXCISION OF THE VULVA;  Surgeon: Imagene Gurney A. Alycia Rossetti, MD;  Location: [facility];  Service: Gynecology;  Laterality: N/A;  left inferior vulvar biopsy excision left superior lesion left inferior vulvar excision  . VULVECTOMY N/A 08/02/2013   Procedure: WIDE LOCAL  EXCISION VULVA;  Surgeon: Imagene Gurney A. Alycia Rossetti, MD;  Location: [facility];  Service: Gynecology;  Laterality: N/A;  . VULVECTOMY Left 03/29/2014   Procedure: WIDE LOCAL EXCISION OF LEFT VULVA ;  Surgeon: Imagene Gurney A.  Alycia Rossetti, MD;  Location: [facility];  Service: Gynecology;  Laterality: Left;  Marland Kitchen VULVECTOMY Bilateral 01/24/2015   Procedure: WIDE LOCAL EXCISION VULVA;  Surgeon: [name], MD;  Location: [facility];  Service: Gynecology;  Laterality: Bilateral;  . VULVECTOMY PARTIAL  07-16-2010     Current Outpatient Medications  Medication Sig Dispense Refill  . acetaminophen (TYLENOL) 500 MG tablet Take 1,000 mg by mouth every 6 (six) hours as needed for mild pain or moderate pain.    Marland Kitchen albuterol (PROVENTIL HFA;VENTOLIN HFA) 108 (90 BASE) MCG/ACT inhaler Inhale 2 puffs into the lungs every 4 (four) hours as needed for wheezing or shortness of breath. Reported on 08/01/2015    . aspirin EC 81 MG EC tablet Take 1 tablet (81 mg total) daily by mouth. 30 tablet 0  . atorvastatin (LIPITOR) 40 MG tablet Take 1 tablet (40 mg total) daily at 6 PM by mouth. 30 tablet 0  . bisoprolol (ZEBETA) 5 MG tablet TAKE 1 TABLET BY MOUTH EVERY DAY 90 tablet 3  . budesonide-formoterol (SYMBICORT) 160-4.5 MCG/ACT inhaler Inhale 2 puffs into the lungs daily as needed (Wheezing in the winter).     Marland Kitchen buPROPion (WELLBUTRIN XL) 150 MG 24 hr tablet Take 150 mg by  mouth every morning.     . celecoxib (CELEBREX) 200 MG capsule Take 1 capsule (200 mg total) by mouth daily. 30 capsule 0  . Cholecalciferol (VITAMIN D3) 1000 UNITS CAPS Take 2,000 Units by mouth daily.     . citalopram (CELEXA) 40 MG tablet Take 40 mg by mouth every morning.     . furosemide (LASIX) 40 MG tablet Take 1 tablet (40 mg total) by mouth 2 (two) times daily. 180 tablet 2  . hydrOXYzine (VISTARIL) 25 MG capsule Take 25 mg by mouth 3 (three) times daily as needed for itching.    . ibandronate (BONIVA) 150 MG tablet Take 150 mg every 30 (thirty) days by mouth. Take in the morning with a full glass of water, on an empty stomach, and do not take anything else by mouth or lie down for the next 30 min.    Marland Kitchen ipratropium-albuterol (DUONEB) 0.5-2.5 (3) MG/3ML SOLN Take 3 mLs by nebulization every 4 (four) hours as needed (Wheezing).     Marland Kitchen ketoconazole (NIZORAL) 2 % shampoo Apply 1 application topically every 14 (fourteen) days.    Marland Kitchen levothyroxine (SYNTHROID) 200 MCG tablet Take 200 mcg by mouth daily before breakfast.     . methocarbamol (ROBAXIN) 500 MG tablet Take 1 tablet (500 mg total) by mouth every 8 (eight) hours as needed for muscle spasms. 30 tablet 0  . methocarbamol (ROBAXIN) 500 MG tablet 1 po q 8hrs prn 30 tablet 0  . mexiletine (MEXITIL) 150 MG capsule Take 1 capsule (150 mg total) by mouth 3 (three) times daily. 60 capsule 3  . montelukast (SINGULAIR) 10 MG tablet Take 10 mg daily by mouth.     . Multiple Vitamin (MULTIVITAMIN WITH MINERALS) TABS tablet Take 1 tablet daily by mouth.    . nystatin (MYCOSTATIN/NYSTOP) powder Apply topically 4 (four) times daily. 60 g 6  . nystatin cream (MYCOSTATIN) Apply 1 application topically 2 (two) times daily. 30 g 6  . pantoprazole (PROTONIX) 40 MG tablet Take 40 mg by mouth daily.     . polyvinyl alcohol (LIQUIFILM TEARS) 1.4 % ophthalmic solution Place 1 drop into both eyes daily as needed for dry eyes.     . potassium chloride (K-DUR)  10  MEQ tablet Take 10 mEq by mouth daily.      No current facility-administered medications for this visit.    Allergies:   Tape, Hydromorphone hcl, Macrobid [nitrofurantoin], Morphine and related, Penicillins, and Valisone [betamethasone]   Social History:  The patient  reports that she quit smoking about 20 years ago. Her smoking use included cigarettes. She has a 82.00 pack-year smoking history. She has never used smokeless tobacco. She reports that she does not drink alcohol and does not use drugs.   Family History:  The patient's family history includes Alcohol abuse in her brother; Alzheimer's disease in her mother; Breast cancer in her cousin, cousin, and paternal aunt; Cancer (age of onset: 53) in her father; Heart attack in her maternal grandmother.   ROS:  Please see the history of present illness.   Otherwise, review of systems is positive for none.   All other systems are reviewed and negative.   PHYSICAL EXAM: VS:  BP 130/80   Pulse 61   Ht 5\' 5"  (1.651 m)   Wt 246 lb 12.8 oz (111.9 kg)   SpO2 95%   BMI 41.07 kg/m  , BMI Body mass index is 41.07 kg/m. GEN: Well nourished, well developed, in no acute distress  HEENT: normal  Neck: no JVD, carotid bruits, or masses Cardiac: RRR; no murmurs, rubs, or gallops,no edema  Respiratory:  clear to auscultation bilaterally, normal work of breathing GI: soft, nontender, nondistended, + BS MS: no deformity or atrophy  Skin: warm and dry Neuro:  Strength and sensation are intact Psych: euthymic mood, full affect  EKG:  EKG is ordered today. Personal review of the ekg ordered shows sinus rhythm  Recent Labs: 10/21/2019: BUN 17; Creatinine, Ser 0.68; Hemoglobin 12.9; Platelets 134; Potassium 4.1; Sodium 142    Lipid Panel  No results found for: CHOL, TRIG, HDL, CHOLHDL, VLDL, LDLCALC, LDLDIRECT   Wt Readings from Last 3 Encounters:  02/28/20 246 lb 12.8 oz (111.9 kg)  01/23/20 244 lb (110.7 kg)  10/25/19 242 lb (109.8 kg)       Other studies Reviewed: Additional studies/ records that were reviewed today include: TTE  07/13/19 Review of the above records today demonstrates:  Wall thickness is normal. Systolic  function is low normal. EF: 50-55%. Wall motion is within normal limits.  No apical thrombus Doppler parameters consistent with restrictive filling  pattern and markedly elevated LA pressure. Mitral valve E/A ratio 2.4. Mitral valve structure is normal. There is mild to moderate mitral  regurgitation.  Cardiac monitor 02/17/2020 personally reviewed Max 130 bpm 09:07pm, 09/15 Min 51 bpm 04:06am, 09/17 Avg 64 bpm Less than 1% PACs 8.7% PVCs Predominant rhythm was sinus rhythm Symptoms associated with sinus rhythm and artifact   ASSESSMENT AND PLAN:  1.  PVCs: Burden of 30% on Holter monitor.  PVCs appear to be coming from the RV base, though they are atypical for outflow tract PVCs.  Currently on mexiletine (ECG monitoring for high risk medication).  Fortunately her PVC burden is 8.4%.  She is noticing less palpitations.  No changes.  2.  Chronic diastolic heart failure: Her weight is up from her baseline weight.  I told her to take Lasix twice daily for the next 3 days to see if this [name] help improve her overall shortness of breath and fluid retention.  If this does improve her shortness of breath, she [name] call her primary cardiologist for possibly chronic increased Lasix.  3.  Dyspnea on exertion: Likely  multifactorial.  She has morbid obesity, COPD, diastolic heart failure, and PVCs.  Also potentially due to obstructive sleep apnea.    Case discussed with primary cardiology  Current medicines are reviewed at length with the patient today.   The patient does not have concerns regarding her medicines.  The following changes were made today: none  Labs/ tests ordered today include:  Orders Placed This Encounter  Procedures  . EKG 12-Lead     Disposition:   FU with [name] 6  months  Signed, Vanessa Mckoy Meredith Leeds, MD  02/28/2020 3:38 PM     [facility] [address] [place] [address] [phone] (office) [phone] (fax)

## 2020-02-28 NOTE — Progress Notes (Signed)
Thanks, [name] 

## 2020-02-29 ENCOUNTER — Encounter: Payer: Self-pay | Admitting: Podiatry

## 2020-02-29 ENCOUNTER — Ambulatory Visit (INDEPENDENT_AMBULATORY_CARE_PROVIDER_SITE_OTHER): Payer: Medicare HMO | Admitting: Podiatry

## 2020-02-29 DIAGNOSIS — M779 Enthesopathy, unspecified: Secondary | ICD-10-CM

## 2020-02-29 DIAGNOSIS — L84 Corns and callosities: Secondary | ICD-10-CM | POA: Diagnosis not present

## 2020-02-29 NOTE — Progress Notes (Signed)
Subjective:   Patient ID: [name], female   DOB: 74 y.o.   MRN: [id number]   HPI Patient states she is getting a lot of pain with callus formation left and also has some inflammation pain of the third metatarsal phalangeal joint and slightly to the third interspace left   ROS      Objective:  Physical Exam  Neurovascular status intact with keratotic lesion x2 left that are painful when pressed with inflammation fluid of the left third MPJ and third interspace     Assessment:  Inflammatory capsulitis left third interspace left third MPJ and keratotic lesion x2 left     Plan:  H&P reviewed both conditions and sterile debridement of lesions on the left accomplished and did inject the third MPJ and interspace 3 mg Dexasone Kenalog 5 mg Xylocaine and reappoint as needed

## 2020-03-26 ENCOUNTER — Telehealth: Payer: Self-pay | Admitting: Oncology

## 2020-03-26 NOTE — Telephone Encounter (Signed)
Received a new a call frm [name] to re-establish care for breast cancer. She last saw Dr. Jana Hakim in 2017 and had gone to [place]. Pt has been scheduled to see Dr. Jana Hakim on 12/8 at 4pm. Pt aware to arrive 15 minutes early.

## 2020-03-28 ENCOUNTER — Other Ambulatory Visit: Payer: Self-pay

## 2020-03-28 ENCOUNTER — Encounter: Payer: Self-pay | Admitting: Orthopedic Surgery

## 2020-03-28 ENCOUNTER — Ambulatory Visit (INDEPENDENT_AMBULATORY_CARE_PROVIDER_SITE_OTHER): Payer: Medicare HMO

## 2020-03-28 ENCOUNTER — Ambulatory Visit: Payer: Medicare HMO | Admitting: Orthopedic Surgery

## 2020-03-28 DIAGNOSIS — M25512 Pain in left shoulder: Secondary | ICD-10-CM

## 2020-03-28 NOTE — Progress Notes (Signed)
Office Visit Note   Patient: [name]           Date of Birth: 1945-11-23           MRN: [id number] Visit Date: 03/28/2020 Requested by: No referring provider defined for this encounter. PCP: Pcp, No  Subjective: Chief Complaint  Patient presents with  . Left Shoulder - Routine Post Op    HPI: [name] is a 74 year old patient who is now 5 months out left reverse shoulder replacement.  Been doing well overall but had an episode where she felt a pop in the left shoulder about 2 weeks ago.  Did not finish therapy due to co-pay issues.  In general however her pain relief has been improved since the surgery.              ROS: All systems reviewed are negative as they relate to the chief complaint within the history of present illness.  Patient denies  fevers or chills.   Assessment & Plan: Visit Diagnoses:  1. Left shoulder pain, unspecified chronicity     Plan: Impression is left shoulder fairly well-functioning reverse shoulder replacement with good pain relief somewhat limited range of motion but we are starting from a very stiff place to begin with.  Plan is to continue with self-directed rehabilitative exercises and follow-up as needed.  Follow-Up Instructions: Return if symptoms worsen or fail to improve.   Orders:  Orders Placed This Encounter  Procedures  . XR Shoulder Left   No orders of the defined types were placed in this encounter.     Procedures: No procedures performed   Clinical Data: No additional findings.  Objective: Vital Signs: There were no vitals taken for this visit.  Physical Exam:   Constitutional: Patient appears well-developed HEENT:  Head: Normocephalic Eyes:EOM are normal Neck: Normal range of motion Cardiovascular: Normal rate Pulmonary/chest: Effort normal Neurologic: Patient is alert Skin: Skin is warm Psychiatric: Patient has normal mood and affect    Ortho Exam: Ortho exam demonstrates functional deltoid.  Prosthesis  is located.  Motor sensory function hand is intact.  No bruising or ecchymosis around that shoulder girdle region.  External rotation of 15 degrees of abduction is about 20 degrees.  Isolated glenohumeral forward flexion abduction both slightly below 90.  Incision intact.  Specialty Comments:  No specialty comments available.  Imaging: XR Shoulder Left  Result Date: 03/28/2020 AP axillary outlet left shoulder reviewed.  Reverse shoulder prosthesis in good position alignment with no complicating features.  No acromial fracture.  No scapular notching.    PMFS History: Patient Active Problem List   Diagnosis Date Noted  . S/P reverse total shoulder arthroplasty, left 10/25/2019  . History of adenomatous polyp of colon 09/30/2018  . Shortness of breath   . ACS (acute coronary syndrome) ([place]) 04/11/2017  . Chest pain, rule out acute myocardial infarction 04/10/2017  . Bigeminy 04/10/2017  . Chronic cystitis 04/09/2017  . Thrombocytopenic disorder ([place]) 02/25/2017  . Osteoporosis 12/15/2016  . 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 Incomplete emptying of bladder 07/30/2016  . Left foot pain 12/11/2015  . 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 Stopped smoking with greater than 40 pack year history 10/23/2015  . 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 Mixed incontinence urge and stress 09/16/2011  . Cancer of right breast, stage 2 ([place]) 05/28/2011  . 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 Hypothyroid 05/28/2011  . GERD (  gastroesophageal reflux disease) 05/28/2011  . Fibromyalgia 05/28/2011  . 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 Mild depression ([place]) 05/28/2011  . Thrombocytopenia, unspecified ([place]) 05/28/2011  . Morbid obesity ([place]) 04/10/2011  . GERD 11/09/2009  . ALLERGIC RHINITIS 08/23/2007  . ASTHMA 08/23/2007  . COUGH 08/23/2007  . SKIN CANCER, HX OF 08/23/2007   Past Medical  History:  Diagnosis Date  . ACS (acute coronary syndrome) ([place]) 04/11/2017  . ALLERGIC RHINITIS 08/23/2007   Qualifier: Diagnosis of  By: Doy Mince LPN, Megan    . Anxiety   . Arthritis   . Asthma   . ASTHMA 08/23/2007   Qualifier: Diagnosis of  By: Doy Mince LPN, Megan    . Bigeminy 04/10/2017  . 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 Cancer of right breast, stage 2 ([place]) 05/28/2011   2.8 cm invasive 1 node pos ER pos S/P mastectomy 06/1995 then AC x 4 then Tam X 5   . Chest pain, rule out acute myocardial infarction 04/10/2017  . Chronic constipation   . Chronic cystitis 04/09/2017  . Cough 08/23/2007   Qualifier: Diagnosis of  By: Gwenette Greet MD, Armando Reichert   . Depression   . 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 Dysrhythmia 2020   Had Afib  . 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 Fibromyalgia   . GERD (gastroesophageal reflux disease)   . History of adenomatous polyp of colon 09/30/2018  . 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 History of colon polyps    2007 hyperplagia  . History of esophageal dilatation    2008  . History of peptic ulcer   . History of small bowel obstruction    2012  . Hypothyroid 05/28/2011  . Hypothyroidism   . Incomplete emptying of bladder 07/30/2016  . Left foot pain 12/11/2015   Overview:  Dr Cyril Mourning [facility]  . Macular degeneration of right eye   . Mild depression ([place]) 05/28/2011  . Mixed incontinence urge and stress 09/16/2011   S/p lumax desires surgery   . Morbid obesity ([place]) 04/10/2011  . Numbness of left foot    4 toes  . Osteoporosis 12/15/2016   Overview:  Dr Cletis Media  . 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[place])   . Personal history of chemotherapy 1997  . Personal history of radiation therapy 2005  . PONV  (postoperative nausea and vomiting)    and HARD TO WAKE  . Seasonal allergies   . Shortness of breath   . SKIN CANCER, HX OF 08/23/2007   Qualifier: Diagnosis of  By: Doy Mince LPN, Megan    . SUI (stress urinary incontinence, female)   . Thrombocytopenic disorder ([place]) 02/25/2017  . Wears glasses     Family History  Problem Relation Age of Onset  . Cancer Father 58       lung and kidney   . Alzheimer's disease Mother   . Heart attack Maternal Grandmother   . Alcohol abuse Brother   . Breast cancer Paternal Aunt   . Breast cancer Cousin   . Breast cancer Cousin     Past Surgical History:  Procedure Laterality Date  . BLADDER SUSPENSION  10/13/2011   Procedure: TRANSVAGINAL TAPE (TVT) PROCEDURE;  Surgeon: Harvie Bridge  Mancel Bale, MD;  Location: [facility];  Service: Gynecology;  Laterality: N/A;  . BREAST BIOPSY Left 03/19/2004  . BREAST LUMPECTOMY Left 03-27-2004  . CARDIAC CATHETERIZATION  10-30-2000  dr Wynonia Lawman   normal coronary arteries and LVF/  ef 65-70%  . CLOSED MANIPULATION  W/ OPEN REDUCTION EXCHANGE TIBIAL FEMORAL BEARING POST TOTAL LEFT KNEE  04-14-2001  . CYSTOSCOPY  10/13/2011   Procedure: CYSTOSCOPY;  Surgeon: Delice Lesch, MD;  Location: [facility];  Service: Gynecology;  Laterality: Bilateral;  . DIAGNOSTIC LAPAROSCOPY     Colon d/t SBO  . DILATION AND CURETTAGE OF UTERUS  1968  . FOOT SURGERY Left 02-01-2009   TRIPLE ARTHRODESIS  . FOOT SURGERY Left 11/14   I&D left foot with woundvac placement  . INCISIONAL HERNIA REPAIR  05/05/2011   Procedure: LAPAROSCOPIC INCISIONAL HERNIA;  Surgeon: Edward Jolly, MD;  Location: [facility];  Service: General;  Laterality: N/A;  LAPAROSCOPIC REPAIR INCARCERATED INCISIONAL HERNIA  WITH MESH  . KNEE ARTHROSCOPY Bilateral left [phone] & 04-1992/   right [phone]  . LAPAROSCOPIC CHOLECYSTECTOMY  06-21-2010  . LEFT HEART CATH AND CORONARY ANGIOGRAPHY N/A 04/13/2017   Procedure: LEFT HEART CATH AND CORONARY ANGIOGRAPHY;  Surgeon: Jettie Booze, MD;  Location: [facility];  Service: Cardiovascular;  Laterality: N/A;  . LUMBAR SPINE SURGERY  1998   L5 -- S1  . MASTECTOMY Right 1997   W/  LYMPH NODE DISSECTION AND RECONSTRUCTION WITH IMPLANTS AND EXPANDER  . NEGATIVE SLEEP STUDY  12/26/2015  . PORT-A-CATH PLACEMENT AND REMOVAL  1997  . REMOVAL RIGHT BREAST IMPLANT AND CAPSULECTOMY  06-03-1999  . REVERSE SHOULDER ARTHROPLASTY Left 10/25/2019   Procedure: LEFT REVERSE SHOULDER ARTHROPLASTY;  Surgeon: Meredith Pel, MD;  Location: [facility];  Service: Orthopedics;  Laterality: Left;  . REVISION TOTAL KNEE ARTHROPLASTY Left 07-07-2005  &  12-10-2001   12-10-2001  REMOVAL TOTAL KNEE/ I&D / INSERTION ANTIBIOTIC SPACER  . REVISION TOTAL KNEE ARTHROPLASTY Right 07-07-2005  . ROTATOR CUFF REPAIR Right   . TENDON REPAIR RIGHT RING FINGER  2011  . THUMB TRIGGER RELEASE Right 1993  . THYROIDECTOMY, PARTIAL  1983  . TONSILLECTOMY  1968  . TOTAL KNEE ARTHROPLASTY Bilateral right 05-04-2000/  left 03-29-2001  . VULVECTOMY  05/27/2012   Procedure: WIDE EXCISION VULVECTOMY;  Surgeon: Imagene Gurney A. Alycia Rossetti, MD;  Location: [facility];  Service: Gynecology;  Laterality: N/A;  Wide Local Excision of Vulva   . VULVECTOMY N/A 11/23/2012   Procedure: WIDE LOCAL EXCISION OF THE VULVA;  Surgeon: Imagene Gurney A. Alycia Rossetti, MD;  Location: [facility];  Service: Gynecology;  Laterality: N/A;  left inferior vulvar biopsy excision left superior lesion left inferior vulvar excision  . VULVECTOMY N/A 08/02/2013   Procedure: WIDE LOCAL  EXCISION VULVA;  Surgeon: Imagene Gurney A. Alycia Rossetti, MD;  Location: [facility];  Service: Gynecology;  Laterality: N/A;  . VULVECTOMY Left 03/29/2014   Procedure: WIDE LOCAL EXCISION OF LEFT VULVA ;  Surgeon: Imagene Gurney A. Alycia Rossetti, MD;  Location: [facility];  Service: Gynecology;  Laterality: Left;  Marland Kitchen VULVECTOMY Bilateral 01/24/2015   Procedure: WIDE LOCAL EXCISION VULVA;  Surgeon: [name], MD;  Location: [facility];  Service:  Gynecology;  Laterality: Bilateral;  . VULVECTOMY PARTIAL  07-16-2010   Social History   Occupational History  . Not on file  Tobacco Use  . Smoking status: Former Smoker    Packs/day: 2.00    Years: 41.00    Pack years: 82.00  Types: Cigarettes    Quit date: 06/05/1999    Years since quitting: 20.8  . Smokeless tobacco: Never Used  Vaping Use  . Vaping Use: Never used  Substance and Sexual Activity  . Alcohol use: No  . Drug use: No  . Sexual activity: Not on file

## 2020-05-01 NOTE — Progress Notes (Signed)
[name]  [phone] [phone]     ID: [name] DOB: Feb 13, 1946  MR#: [id number]  KCM#:[id number]  Patient Care Team: Pcp, No as PCP - General Constance Haw, MD as PCP - Electrophysiology (Cardiology) [name], MD as Attending Physician (Gynecologic Oncology) Lynze Reddy, Virgie Dad, MD as Consulting Physician (Oncology) Excell Seltzer, MD (Inactive) as Consulting Physician (General Surgery) Delsa Bern, MD as Consulting Physician (Obstetrics and Gynecology) Annia Belt, MD as Consulting Physician (Oncology) Croitoru, Dani Gobble, MD as Consulting Physician (Cardiology) Chauncey Cruel, MD OTHER MD:  CHIEF COMPLAINT: 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 Subsequently in 03/28/2004 she underwent left excisional biopsy for ductal carcinoma in situ, grade 2, measuring 2 cm, with negative margins, estrogen and progesterone receptor positive. She was treated with letrozole after that point, again for six years  She also has a history of Paget's disease of the vulva followed by Dr. Alycia Rossetti at [facility].  Her most recent biopsy November 2021 was still positive and she is treating this with local agents  The patient's subsequent history is as detailed below.   INTERVAL HISTORY: Allison was evaluated in the breast cancer clinic on 05/02/2020. She was last seen here on 10/23/2015 for right chest wall pain.  Her most recent left breast screening mammogram was performed July 2021 at Dr. Boyd Kerbs    REVIEW OF SYSTEMS: [name] does have a new diagnosis of macular degeneration and this is being treated.  She has multiple  arthritic issues and walks with a cane but denies recent falls.  She is not on a special diet at this point.  She is not able to exercise regularly.  A detailed review of systems was otherwise stable   COVID 19 VACCINATION STATUS: Status post Pfizer x2+ booster November 2021   PAST MEDICAL HISTORY: Past Medical History:  Diagnosis Date  . ACS (acute coronary syndrome) ([place]) 04/11/2017  . ALLERGIC RHINITIS 08/23/2007   Qualifier: Diagnosis of  By: Doy Mince LPN, Megan    . Anxiety   . Arthritis   . Asthma   . ASTHMA 08/23/2007   Qualifier: Diagnosis of  By: Doy Mince LPN, Megan    . Bigeminy 04/10/2017  . 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 Cancer of right breast, stage 2 ([place]) 05/28/2011   2.8 cm invasive 1 node pos ER pos S/P mastectomy 06/1995 then AC x 4 then Tam X 5   . Chest pain, rule out acute myocardial infarction 04/10/2017  . Chronic constipation   . Chronic cystitis 04/09/2017  . Cough 08/23/2007   Qualifier: Diagnosis of  By: Gwenette Greet MD, Armando Reichert   . Depression   . 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 Dysrhythmia 2020   Had Afib  . 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 Fibromyalgia   . GERD (gastroesophageal reflux disease)   . History of adenomatous polyp of Moreno 09/30/2018  . History of breast cancer no recurrence   1997  right breast cancer  s/p  mastectomy w/ snl dissection and chemoradiation/  2005  left breast cancer DCIS  s/p  lumpectomy and  radiation  . History of Moreno polyps    2007 hyperplagia  . History of esophageal dilatation    2008  . History of peptic ulcer   . History of small bowel obstruction    2012  . Hypothyroid 05/28/2011  . Hypothyroidism   . Incomplete emptying of bladder 07/30/2016  . Left foot pain 12/11/2015   Overview:  Dr Cyril Mourning [facility]  . Macular degeneration of right eye   . Mild depression ([place]) 05/28/2011  . Mixed  incontinence urge and stress 09/16/2011   S/p lumax desires surgery   . Morbid obesity ([place]) 04/10/2011  . Numbness of left foot    4 toes  . Osteoporosis 12/15/2016   Overview:  Dr Cletis Media  . 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[place])   . Personal history of chemotherapy 1997  . Personal history of radiation therapy 2005  . PONV (postoperative nausea and vomiting)    and HARD TO WAKE  . Seasonal allergies   . Shortness of breath   . SKIN CANCER, HX OF 08/23/2007   Qualifier: Diagnosis of  By: Doy Mince LPN, Megan    . SUI (stress urinary incontinence, female)   . Thrombocytopenic disorder ([place]) 02/25/2017  . Wears glasses     PAST SURGICAL HISTORY: Past Surgical History:  Procedure Laterality Date  . BLADDER SUSPENSION  10/13/2011   Procedure: TRANSVAGINAL TAPE (TVT) PROCEDURE;  Surgeon: Delice Lesch, MD;  Location: [facility];  Service: Gynecology;  Laterality: N/A;  . BREAST BIOPSY Left 03/19/2004  . BREAST LUMPECTOMY Left 03-27-2004  . CARDIAC CATHETERIZATION  10-30-2000  dr Wynonia Lawman   normal coronary arteries and LVF/  ef 65-70%  . CLOSED MANIPULATION  W/ OPEN REDUCTION EXCHANGE TIBIAL FEMORAL BEARING POST TOTAL LEFT KNEE  04-14-2001  . CYSTOSCOPY  10/13/2011   Procedure: CYSTOSCOPY;  Surgeon: Delice Lesch, MD;  Location: [facility];  Service: Gynecology;  Laterality: Bilateral;  . DIAGNOSTIC LAPAROSCOPY     Moreno d/t SBO  . DILATION AND CURETTAGE OF UTERUS  1968  . FOOT SURGERY Left 02-01-2009   TRIPLE ARTHRODESIS  . FOOT SURGERY Left 11/14   I&D left foot with woundvac placement  . INCISIONAL HERNIA REPAIR  05/05/2011   Procedure: LAPAROSCOPIC INCISIONAL HERNIA;  Surgeon: Edward Jolly, MD;  Location: [facility];  Service: General;  Laterality: N/A;  LAPAROSCOPIC REPAIR INCARCERATED INCISIONAL HERNIA  WITH MESH  . KNEE ARTHROSCOPY Bilateral left [phone] & 04-1992/   right [phone]  . LAPAROSCOPIC CHOLECYSTECTOMY  06-21-2010  . LEFT HEART CATH AND CORONARY ANGIOGRAPHY N/A 04/13/2017    Procedure: LEFT HEART CATH AND CORONARY ANGIOGRAPHY;  Surgeon: Jettie Booze, MD;  Location: [facility];  Service: Cardiovascular;  Laterality: N/A;  . LUMBAR SPINE SURGERY  1998   L5 -- S1  . MASTECTOMY Right 1997   W/  LYMPH NODE DISSECTION AND RECONSTRUCTION WITH IMPLANTS AND EXPANDER  . NEGATIVE SLEEP STUDY  12/26/2015  . PORT-A-CATH PLACEMENT AND REMOVAL  1997  . REMOVAL RIGHT BREAST IMPLANT AND CAPSULECTOMY  06-03-1999  . REVERSE SHOULDER ARTHROPLASTY Left 10/25/2019   Procedure: LEFT REVERSE SHOULDER ARTHROPLASTY;  Surgeon: Meredith Pel, MD;  Location: [facility];  Service: Orthopedics;  Laterality: Left;  . REVISION TOTAL KNEE ARTHROPLASTY Left 07-07-2005  &  12-10-2001   12-10-2001  REMOVAL TOTAL KNEE/ I&D / INSERTION ANTIBIOTIC SPACER  . REVISION TOTAL KNEE ARTHROPLASTY Right 07-07-2005  . ROTATOR CUFF REPAIR Right   . TENDON REPAIR RIGHT RING FINGER  2011  .  THUMB TRIGGER RELEASE Right 1993  . THYROIDECTOMY, PARTIAL  1983  . TONSILLECTOMY  1968  . TOTAL KNEE ARTHROPLASTY Bilateral right 05-04-2000/  left 03-29-2001  . VULVECTOMY  05/27/2012   Procedure: WIDE EXCISION VULVECTOMY;  Surgeon: Imagene Gurney A. Alycia Rossetti, MD;  Location: [facility];  Service: Gynecology;  Laterality: N/A;  Wide Local Excision of Vulva   . VULVECTOMY N/A 11/23/2012   Procedure: WIDE LOCAL EXCISION OF THE VULVA;  Surgeon: Imagene Gurney A. Alycia Rossetti, MD;  Location: [facility];  Service: Gynecology;  Laterality: N/A;  left inferior vulvar biopsy excision left superior lesion left inferior vulvar excision  . VULVECTOMY N/A 08/02/2013   Procedure: WIDE LOCAL  EXCISION VULVA;  Surgeon: Imagene Gurney A. Alycia Rossetti, MD;  Location: [facility];  Service: Gynecology;  Laterality: N/A;  . VULVECTOMY Left 03/29/2014   Procedure: WIDE LOCAL EXCISION OF LEFT VULVA ;  Surgeon: Imagene Gurney A. Alycia Rossetti, MD;  Location[facility];  Service: Gynecology;  Laterality: Left;  Marland Kitchen VULVECTOMY Bilateral 01/24/2015   Procedure: WIDE LOCAL EXCISION VULVA;   Surgeon: [name], MD;  Location: [facility];  Service: Gynecology;  Laterality: Bilateral;  . VULVECTOMY PARTIAL  07-16-2010    FAMILY HISTORY: Family History  Problem Relation Age of Onset  . Cancer Father 79       lung and kidney   . Alzheimer's disease Mother   . Heart attack Maternal Grandmother   . Alcohol abuse Brother   . Breast cancer Paternal Aunt   . Breast cancer Cousin   . Breast cancer Cousin   The patient's father had a history of lung cancer metastatic to the kidneys. He died at age 78. The patient's mother had Alzheimer's disease and died at age 24. The patient had 3 brothers, 2 sisters. One brother has been diagnosed with prostate cancer at age 27. There is also: Lung and brain cancers in cousins and prostate cancer in a paternal uncle.   GYNECOLOGIC HISTORY:  No LMP recorded. Patient is postmenopausal. Menarche: 74 years old Age at first live birth: 74 years old Levy P 1 (stillborn) LMP 1997, with chemotherapy Contraceptive: remote use without complications Hysterectomy? no BSO? 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 Husband Ronalee Belts is retired from working in Engineer, materials.     ADVANCED DIRECTIVES: In the absence of any documentation to the contrary, the patient's spouse is their HCPOA.    HEALTH MAINTENANCE: Social History   Tobacco Use  . Smoking status: Former Smoker    Packs/day: 2.00    Years: 41.00    Pack years: 82.00    Types: Cigarettes    Quit date: 06/05/1999    Years since quitting: 20.9  . Smokeless tobacco: Never Used  Vaping Use  . Vaping Use: Never used  Substance Use Topics  . Alcohol use: No  . Drug use: No     Colonoscopy: [facility]  PAP: Rivard  Bone density: Rivard   Allergies  Allergen Reactions  . Tape     "thick clear plastic tape" causes blisters   . Hydromorphone Hcl Itching and Rash  . Macrobid [Nitrofurantoin] Rash  . Morphine And Related  Itching and Rash  . Penicillins Hives and Swelling    1968  . Valisone [Betamethasone] Rash    Current Outpatient Medications  Medication Sig Dispense Refill  . acetaminophen (TYLENOL) 500 MG tablet Take 1,000 mg by mouth every 6 (six) hours as needed for mild pain or  moderate pain.    Marland Kitchen albuterol (PROVENTIL HFA;VENTOLIN HFA) 108 (90 BASE) MCG/ACT inhaler Inhale 2 puffs into the lungs every 4 (four) hours as needed for wheezing or shortness of breath. Reported on 08/01/2015    . aspirin EC 81 MG EC tablet Take 1 tablet (81 mg total) daily by mouth. 30 tablet 0  . atorvastatin (LIPITOR) 40 MG tablet Take 1 tablet (40 mg total) daily at 6 PM by mouth. 30 tablet 0  . bisoprolol (ZEBETA) 5 MG tablet TAKE 1 TABLET BY MOUTH EVERY DAY 90 tablet 3  . budesonide-formoterol (SYMBICORT) 160-4.5 MCG/ACT inhaler Inhale 2 puffs into the lungs daily as needed (Wheezing in the winter).     Marland Kitchen buPROPion (WELLBUTRIN XL) 150 MG 24 hr tablet Take 150 mg by mouth every morning.     . celecoxib (CELEBREX) 200 MG capsule Take 1 capsule (200 mg total) by mouth daily. 30 capsule 0  . Cholecalciferol (VITAMIN D3) 1000 UNITS CAPS Take 2,000 Units by mouth daily.     . citalopram (CELEXA) 40 MG tablet Take 40 mg by mouth every morning.     . citalopram (CELEXA) 40 MG tablet Take 1 tablet by mouth daily.    . furosemide (LASIX) 40 MG tablet Take 1 tablet (40 mg total) by mouth 2 (two) times daily. 180 tablet 2  . hydrOXYzine (VISTARIL) 25 MG capsule Take 25 mg by mouth 3 (three) times daily as needed for itching.    . ibandronate (BONIVA) 150 MG tablet Take 150 mg every 30 (thirty) days by mouth. Take in the morning with a full glass of water, on an empty stomach, and do not take anything else by mouth or lie down for the next 30 min.    Marland Kitchen ipratropium-albuterol (DUONEB) 0.5-2.5 (3) MG/3ML SOLN Take 3 mLs by nebulization every 4 (four) hours as needed (Wheezing).     Marland Kitchen ketoconazole (NIZORAL) 2 % shampoo Apply 1 application  topically every 14 (fourteen) days.    Marland Kitchen levothyroxine (SYNTHROID) 200 MCG tablet Take 200 mcg by mouth daily before breakfast.     . methocarbamol (ROBAXIN) 500 MG tablet Take 1 tablet (500 mg total) by mouth every 8 (eight) hours as needed for muscle spasms. 30 tablet 0  . mexiletine (MEXITIL) 150 MG capsule Take 1 capsule (150 mg total) by mouth 3 (three) times daily. 60 capsule 3  . montelukast (SINGULAIR) 10 MG tablet Take 10 mg daily by mouth.     . Multiple Vitamin (MULTIVITAMIN WITH MINERALS) TABS tablet Take 1 tablet daily by mouth.    . nystatin (MYCOSTATIN/NYSTOP) powder Apply topically 4 (four) times daily. 60 g 6  . nystatin cream (MYCOSTATIN) Apply 1 application topically 2 (two) times daily. 30 g 6  . pantoprazole (PROTONIX) 40 MG tablet Take 40 mg by mouth daily.     . polyvinyl alcohol (LIQUIFILM TEARS) 1.4 % ophthalmic solution Place 1 drop into both eyes daily as needed for dry eyes.     . potassium chloride (K-DUR) 10 MEQ tablet Take 10 mEq by mouth daily.     . clindamycin (CLEOCIN) 150 MG capsule Take 150 mg by mouth 3 (three) times daily.     No current facility-administered medications for this visit.    OBJECTIVE: White woman who appears stated age  23:   05/02/20 1604  BP: (!) 158/57  Pulse: 72  Resp: 19  Temp: 97.7 F (36.5 C)  SpO2: 98%     Body mass index is 42.67  kg/m.   Wt Readings from Last 3 Encounters:  05/02/20 256 lb 6.4 oz (116.3 kg)  02/28/20 246 lb 12.8 oz (111.9 kg)  01/23/20 244 lb (110.7 kg)      ECOG FS:2 - Symptomatic, <50% confined to bed  Ocular: Sclerae unicteric, pupils round and equal Ear-nose-throat: Wearing a mask Lymphatic: No cervical or supraclavicular adenopathy Lungs no rales or rhonchi Heart regular rate and rhythm Abd soft, obese, nontender, positive bowel sounds MSK no focal spinal tenderness, walks with a cane Neuro: non-focal, well-oriented, appropriate affect Breasts: The right breast is status post  mastectomy.  There is no evidence of chest wall recurrence.  The left breast is status post lumpectomy.  There are no palpable masses or skin changes of concern.  Both axillae are benign   LAB RESULTS:  CMP     Component Value Date/Time   NA 142 10/21/2019 1133   NA 143 10/23/2015 1542   K 4.1 10/21/2019 1133   K 4.7 10/23/2015 1542   CL 103 10/21/2019 1133   CO2 29 10/21/2019 1133   CO2 28 10/23/2015 1542   GLUCOSE 107 (H) 10/21/2019 1133   GLUCOSE 97 10/23/2015 1542   BUN 17 10/21/2019 1133   BUN 12.2 10/23/2015 1542   CREATININE 0.68 10/21/2019 1133   CREATININE 0.7 10/23/2015 1542   CALCIUM 9.1 10/21/2019 1133   CALCIUM 9.7 10/23/2015 1542   PROT 6.6 10/23/2015 1542   ALBUMIN 3.9 10/23/2015 1542   AST 17 10/23/2015 1542   ALT 14 10/23/2015 1542   ALKPHOS 85 10/23/2015 1542   BILITOT 0.32 10/23/2015 1542   GFRNONAA >60 10/21/2019 1133   GFRAA >60 10/21/2019 1133    No results found for: TOTALPROTELP, ALBUMINELP, A1GS, A2GS, BETS, BETA2SER, GAMS, MSPIKE, SPEI  Lab Results  Component Value Date   WBC 4.2 10/21/2019   NEUTROABS 2,856 08/11/2019   HGB 12.9 10/21/2019   HCT 41.3 10/21/2019   MCV 89.2 10/21/2019   PLT 134 (L) 10/21/2019    No results found for: LABCA2  No components found for: QIHKVQ259  No results for input(s): INR in the last 168 hours.  No results found for: LABCA2  No results found for: DGL875  No results found for: IEP329  No results found for: JJO841  No results found for: CA2729  No components found for: HGQUANT  No results found for: CEA1 / No results found for: CEA1   No results found for: AFPTUMOR  No results found for: CHROMOGRNA  No results found for: KPAFRELGTCHN, LAMBDASER, KAPLAMBRATIO (kappa/lambda light chains)  No results found for: HGBA, HGBA2QUANT, HGBFQUANT, HGBSQUAN (Hemoglobinopathy evaluation)   Lab Results  Component Value Date   LDH 235 09/08/2011    No results found for: IRON, TIBC,  IRONPCTSAT (Iron and TIBC)  No results found for: FERRITIN  Urinalysis    Component Value Date/Time   COLORURINE STRAW (A) 10/21/2019 1200   APPEARANCEUR CLEAR 10/21/2019 1200   LABSPEC 1.006 10/21/2019 1200   LABSPEC 1.010 06/15/2013 1534   PHURINE 7.0 10/21/2019 1200   GLUCOSEU NEGATIVE 10/21/2019 1200   GLUCOSEU Negative 06/15/2013 1534   HGBUR SMALL (A) 10/21/2019 1200   BILIRUBINUR NEGATIVE 10/21/2019 1200   BILIRUBINUR Negative 06/15/2013 1534   KETONESUR NEGATIVE 10/21/2019 1200   PROTEINUR NEGATIVE 10/21/2019 1200   UROBILINOGEN 0.2 06/15/2013 1534   NITRITE NEGATIVE 10/21/2019 1200   LEUKOCYTESUR NEGATIVE 10/21/2019 1200   LEUKOCYTESUR Small 06/15/2013 1534     STUDIES: No results found.   ELIGIBLE FOR  AVAILABLE RESEARCH PROTOCOL: no  ASSESSMENT: 74 y.o. [place] woman  (1) status post right mastectomy and axillary lymph node dissection in 1997 for a stage II, estrogen receptor positive invasive breast cancer, treated adjuvantly with chemotherapy (agents not retrievable), followed by tamoxifen for 6 years  (2) status post left excisional biopsy 03/28/2004 for ductal carcinoma in situ, grade 2, measuring 2 cm, with negative margins, estrogen and progesterone receptor positive. She received letrozole x6 years after this.  (3) greater than 40-pack-year smoking history, the patient quitting in 2001   PLAN: Coretta is now 24 years out from her invasive breast cancer and 16 years out from her noninvasive breast cancer with no evidence of disease recurrence.  This is very favorable.  She knows that I would be comfortable releasing her to her primary care physician but she would prefer to see [place] on a once a year basis and I am glad to accommodate that.  She has not been genetically tested.  We discussed that at length today and I have gone ahead and put the order in for next week.    She is worried about her platelets.  I reassured her that so long  as her platelet count is greater than 50,000 she should be fine.  I do not find any readings below 100,000.  The bruising she is experiencing is really due to senile skin atrophy and it is all on the outer none in the inner aspect of the arms and legs.  Nevertheless I am going to obtain platelet reading and citrate to see if we can clarify that further.  Note that her hemoglobin and white cell count are unremarkable  Finally she does have a more than 40-pack-year smoking history.  She has been off cigarettes for 21years.  She deserves a low radiation noncontrast screening CT of the chest and I have ordered that as well  Assuming all is negative she will see me again in October 2022  Total encounter time 45 minutes.*  She will call with any problems that may develop before her next visit here.   Virgie Dad. Jacari Kirsten, MD 05/02/2020 4:57 PM Medical Oncology and Hematology [facility] [place], [place] [address] Tel. [phone]    Fax. [phone]   This document serves as a record of services personally performed by Lurline Del, MD. It was created on his behalf by Wilburn Mylar, a trained medical scribe. The creation of this record is based on the scribe's personal observations and the provider's statements to them.   I, Lurline Del MD, have reviewed the above documentation for accuracy and completeness, and I agree with the above.    *Total Encounter Time as defined by the Centers for Medicare and Medicaid Services includes, in addition to the face-to-face time of a patient visit (documented in the note above) non-face-to-face time: obtaining and reviewing outside history, ordering and reviewing medications, tests or procedures, care coordination (communications with other health care professionals or caregivers) and documentation in the medical record.

## 2020-05-02 ENCOUNTER — Encounter: Payer: Self-pay | Admitting: Podiatry

## 2020-05-02 ENCOUNTER — Ambulatory Visit: Payer: Medicare HMO | Admitting: Podiatry

## 2020-05-02 ENCOUNTER — Other Ambulatory Visit: Payer: Self-pay

## 2020-05-02 ENCOUNTER — Other Ambulatory Visit: Payer: Self-pay | Admitting: Genetic Counselor

## 2020-05-02 ENCOUNTER — Other Ambulatory Visit: Payer: Self-pay | Admitting: *Deleted

## 2020-05-02 ENCOUNTER — Inpatient Hospital Stay: Payer: Medicare HMO | Attending: Oncology | Admitting: Oncology

## 2020-05-02 VITALS — BP 158/57 | HR 72 | Temp 97.7°F | Resp 19 | Ht 65.0 in | Wt 256.4 lb

## 2020-05-02 DIAGNOSIS — Z9221 Personal history of antineoplastic chemotherapy: Secondary | ICD-10-CM | POA: Diagnosis not present

## 2020-05-02 DIAGNOSIS — Z853 Personal history of malignant neoplasm of breast: Secondary | ICD-10-CM | POA: Diagnosis present

## 2020-05-02 DIAGNOSIS — Z85828 Personal history of other malignant neoplasm of skin: Secondary | ICD-10-CM | POA: Diagnosis not present

## 2020-05-02 DIAGNOSIS — Z9011 Acquired absence of right breast and nipple: Secondary | ICD-10-CM

## 2020-05-02 DIAGNOSIS — M818 Other osteoporosis without current pathological fracture: Secondary | ICD-10-CM

## 2020-05-02 DIAGNOSIS — Z923 Personal history of irradiation: Secondary | ICD-10-CM | POA: Diagnosis not present

## 2020-05-02 DIAGNOSIS — Z86010 Personal history of colonic polyps: Secondary | ICD-10-CM | POA: Insufficient documentation

## 2020-05-02 DIAGNOSIS — Z87891 Personal history of nicotine dependence: Secondary | ICD-10-CM | POA: Diagnosis not present

## 2020-05-02 DIAGNOSIS — D0512 Intraductal carcinoma in situ of left breast: Secondary | ICD-10-CM

## 2020-05-02 DIAGNOSIS — L84 Corns and callosities: Secondary | ICD-10-CM | POA: Diagnosis not present

## 2020-05-02 DIAGNOSIS — Z803 Family history of malignant neoplasm of breast: Secondary | ICD-10-CM

## 2020-05-02 DIAGNOSIS — E039 Hypothyroidism, unspecified: Secondary | ICD-10-CM | POA: Insufficient documentation

## 2020-05-02 DIAGNOSIS — D696 Thrombocytopenia, unspecified: Secondary | ICD-10-CM

## 2020-05-02 DIAGNOSIS — C4499 Other specified malignant neoplasm of skin, unspecified: Secondary | ICD-10-CM

## 2020-05-02 DIAGNOSIS — Z8042 Family history of malignant neoplasm of prostate: Secondary | ICD-10-CM

## 2020-05-02 DIAGNOSIS — C50911 Malignant neoplasm of unspecified site of right female breast: Secondary | ICD-10-CM

## 2020-05-02 NOTE — Progress Notes (Signed)
Subjective:   Patient ID: [name], female   DOB: 74 y.o.   MRN: [id number]   HPI Patient presents with 4 lesions that are very painful when pressed and states that she only gets about 8 weeks of relief   ROS      Objective:  Physical Exam  Neurovascular status intact with severe keratotic lesion plantar aspect both feet     Assessment:  Chronic lesion formation secondary to bone structure     Plan:  Debridement of lesion bilateral no iatrogenic bleeding reappoint routine care

## 2020-05-04 ENCOUNTER — Telehealth: Payer: Self-pay | Admitting: Oncology

## 2020-05-04 NOTE — Telephone Encounter (Signed)
Scheduled appts per 12/8 los. Pt's spouse confirmed appt dates and times.

## 2020-05-09 ENCOUNTER — Other Ambulatory Visit: Payer: Medicare HMO

## 2020-05-10 ENCOUNTER — Inpatient Hospital Stay: Payer: Medicare HMO

## 2020-05-10 ENCOUNTER — Other Ambulatory Visit: Payer: Self-pay

## 2020-05-10 ENCOUNTER — Other Ambulatory Visit: Payer: Self-pay | Admitting: Oncology

## 2020-05-10 ENCOUNTER — Ambulatory Visit (HOSPITAL_COMMUNITY)
Admission: RE | Admit: 2020-05-10 | Discharge: 2020-05-10 | Disposition: A | Discharge status: Home / Self Care | Payer: Medicare HMO | Source: Ambulatory Visit | Attending: Oncology | Admitting: Oncology

## 2020-05-10 DIAGNOSIS — D0512 Intraductal carcinoma in situ of left breast: Secondary | ICD-10-CM | POA: Diagnosis present

## 2020-05-10 DIAGNOSIS — C50911 Malignant neoplasm of unspecified site of right female breast: Secondary | ICD-10-CM

## 2020-05-10 DIAGNOSIS — Z87891 Personal history of nicotine dependence: Secondary | ICD-10-CM

## 2020-05-10 DIAGNOSIS — C4499 Other specified malignant neoplasm of skin, unspecified: Secondary | ICD-10-CM

## 2020-05-10 DIAGNOSIS — R071 Chest pain on breathing: Secondary | ICD-10-CM | POA: Diagnosis present

## 2020-05-10 DIAGNOSIS — Z8042 Family history of malignant neoplasm of prostate: Secondary | ICD-10-CM

## 2020-05-10 DIAGNOSIS — Z853 Personal history of malignant neoplasm of breast: Secondary | ICD-10-CM | POA: Diagnosis not present

## 2020-05-10 DIAGNOSIS — Z803 Family history of malignant neoplasm of breast: Secondary | ICD-10-CM

## 2020-05-10 LAB — CBC (CANCER CENTER ONLY)
HCT: 41 % (ref 36.0–46.0)
Hemoglobin: 12.7 g/dL (ref 12.0–15.0)
MCH: 27.5 pg (ref 26.0–34.0)
MCHC: 31 g/dL (ref 30.0–36.0)
MCV: 88.7 fL (ref 80.0–100.0)
Platelet Count: 127 10*3/uL — ABNORMAL LOW (ref 150–400)
RBC: 4.62 MIL/uL (ref 3.87–5.11)
RDW: 14.8 % (ref 11.5–15.5)
WBC Count: 3.9 10*3/uL — ABNORMAL LOW (ref 4.0–10.5)
nRBC: 0 % (ref 0.0–0.2)

## 2020-05-10 LAB — CMP (CANCER CENTER ONLY)
ALT: 17 U/L (ref 0–44)
AST: 21 U/L (ref 15–41)
Albumin: 3.8 g/dL (ref 3.5–5.0)
Alkaline Phosphatase: 97 U/L (ref 38–126)
Anion gap: 8 (ref 5–15)
BUN: 10 mg/dL (ref 8–23)
CO2: 32 mmol/L (ref 22–32)
Calcium: 9.6 mg/dL (ref 8.9–10.3)
Chloride: 107 mmol/L (ref 98–111)
Creatinine: 0.77 mg/dL (ref 0.44–1.00)
GFR, Estimated: >60 mL/min (ref >60)
Glucose, Bld: 90 mg/dL (ref 70–99)
Potassium: 4.5 mmol/L (ref 3.5–5.1)
Sodium: 147 mmol/L — ABNORMAL HIGH (ref 135–145)
Total Bilirubin: 0.5 mg/dL (ref 0.3–1.2)
Total Protein: 6.8 g/dL (ref 6.5–8.1)

## 2020-05-10 LAB — PLATELET BY CITRATE

## 2020-05-10 LAB — GENETIC SCREENING ORDER

## 2020-05-11 ENCOUNTER — Other Ambulatory Visit: Payer: Self-pay | Admitting: Cardiology

## 2020-05-23 ENCOUNTER — Encounter: Payer: Self-pay | Admitting: Genetic Counselor

## 2020-05-23 DIAGNOSIS — Z1379 Encounter for other screening for genetic and chromosomal anomalies: Secondary | ICD-10-CM | POA: Insufficient documentation

## 2020-05-28 ENCOUNTER — Encounter: Payer: Medicare HMO | Admitting: Genetic Counselor

## 2020-06-01 ENCOUNTER — Telehealth: Payer: Self-pay | Admitting: *Deleted

## 2020-06-01 NOTE — Telephone Encounter (Signed)
Pt called to follow up on genetic testing obtained 05/10/2021.  Noted as drawn on above date- but not noted results in [place]- this note will be forwarded to MD for further inquiry as no note in system from our [facility] department.

## 2020-06-08 ENCOUNTER — Other Ambulatory Visit: Payer: Self-pay | Admitting: Orthopedic Surgery

## 2020-06-11 NOTE — Telephone Encounter (Signed)
Please advise. Thanks.  

## 2020-06-12 ENCOUNTER — Other Ambulatory Visit: Payer: Self-pay | Admitting: Cardiology

## 2020-06-13 NOTE — Telephone Encounter (Signed)
This is Dr. Croitoru's pt 

## 2020-07-04 ENCOUNTER — Telehealth: Payer: Self-pay

## 2020-07-04 NOTE — Telephone Encounter (Signed)
RN returned call, no answer no option to leave voicemail.

## 2020-07-12 ENCOUNTER — Ambulatory Visit: Payer: Medicare HMO | Admitting: Orthopedic Surgery

## 2020-07-12 ENCOUNTER — Other Ambulatory Visit: Payer: Self-pay

## 2020-07-12 ENCOUNTER — Ambulatory Visit (INDEPENDENT_AMBULATORY_CARE_PROVIDER_SITE_OTHER): Payer: Medicare HMO

## 2020-07-12 DIAGNOSIS — M25561 Pain in right knee: Secondary | ICD-10-CM | POA: Diagnosis not present

## 2020-07-12 MED ORDER — METHOCARBAMOL 500 MG PO TABS
500.0000 mg | ORAL_TABLET | Freq: Three times a day (TID) | ORAL | 0 refills | Status: DC | PRN
Start: 2020-07-12 — End: 2020-09-04

## 2020-07-15 ENCOUNTER — Encounter: Payer: Self-pay | Admitting: Orthopedic Surgery

## 2020-07-15 NOTE — Progress Notes (Signed)
Office Visit Note   Patient: [name]           Date of Birth: 08-22-1945           MRN: [id number] Visit Date: 07/12/2020 Requested by: No referring provider defined for this encounter. PCP: Pcp, No  Subjective: Chief Complaint  Patient presents with  . Right Knee - Pain    HPI: [name] is a patient with right knee pain and proximal tibial catching.  Radiates to the top of her foot.  Has a history of right total knee replacement and revision.  She had a fall February 11.  Denies any groin pain.  Is on osteoporosis medication.  Has a history of brace use years ago.  Uses a walker and a cane.  Uses muscle relaxer and Tylenol.  Denies any fevers or chills.              ROS: All systems reviewed are negative as they relate to the chief complaint within the history of present illness.  Patient denies  fevers or chills.   Assessment & Plan: Visit Diagnoses:  1. Right knee pain, unspecified chronicity     Plan: Impression is right knee pain with well-functioning revision knee replacement and no radiographic abnormalities.  I will refill her Robaxin and get her another hinged knee brace.  Follow-up as needed.  No indication for further work-up at this time.  Follow-Up Instructions: Return if symptoms worsen or fail to improve.   Orders:  Orders Placed This Encounter  Procedures  . XR Knee 1-2 Views Right   Meds ordered this encounter  Medications  . methocarbamol (ROBAXIN) 500 MG tablet    Sig: Take 1 tablet (500 mg total) by mouth every 8 (eight) hours as needed for muscle spasms.    Dispense:  40 tablet    Refill:  0      Procedures: No procedures performed   Clinical Data: No additional findings.  Objective: Vital Signs: There were no vitals taken for this visit.  Physical Exam:   Constitutional: Patient appears well-developed HEENT:  Head: Normocephalic Eyes:EOM are normal Neck: Normal range of motion Cardiovascular: Normal rate Pulmonary/chest: Effort  normal Neurologic: Patient is alert Skin: Skin is warm Psychiatric: Patient has normal mood and affect    Ortho Exam: Ortho exam demonstrates no effusion in the right knee.  No groin pain on the right with internal extra rotation of the leg.  Ankle dorsiflexion intact and foot is perfused.  Collaterals are stable and extensor mechanism is intact in the right knee.  Patient can flex just shy of 90 degrees.  Specialty Comments:  No specialty comments available.  Imaging: No results found.   PMFS History: Patient Active Problem List   Diagnosis Date Noted  . Genetic testing 05/23/2020  . S/P reverse total shoulder arthroplasty, left 10/25/2019  . History of adenomatous polyp of colon 09/30/2018  . Shortness of breath   . ACS (acute coronary syndrome) ([place]) 04/11/2017  . Chest pain, rule out acute myocardial infarction 04/10/2017  . Bigeminy 04/10/2017  . Chronic cystitis 04/09/2017  . Thrombocytopenic disorder ([place]) 02/25/2017  . Osteoporosis 12/15/2016  . 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 Incomplete emptying of bladder 07/30/2016  . Left foot pain 12/11/2015  . 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 Stopped smoking with greater than 40 pack year history 10/23/2015  . 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 Mixed incontinence urge and stress 09/16/2011  .  Cancer of right breast, stage 2 ([place]) 05/28/2011  . 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 Hypothyroid 05/28/2011  . GERD (gastroesophageal reflux disease) 05/28/2011  . Fibromyalgia 05/28/2011  . 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 Mild depression ([place]) 05/28/2011  . Thrombocytopenia, unspecified ([place]) 05/28/2011  . Morbid obesity ([place]) 04/10/2011  . GERD 11/09/2009  . ALLERGIC RHINITIS 08/23/2007  . ASTHMA 08/23/2007  . COUGH 08/23/2007  . SKIN CANCER, HX OF 08/23/2007   Past Medical History:  Diagnosis Date  . ACS  (acute coronary syndrome) ([place]) 04/11/2017  . ALLERGIC RHINITIS 08/23/2007   Qualifier: Diagnosis of  By: Doy Mince LPN, Megan    . Anxiety   . Arthritis   . Asthma   . ASTHMA 08/23/2007   Qualifier: Diagnosis of  By: Doy Mince LPN, Megan    . Bigeminy 04/10/2017  . 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 Cancer of right breast, stage 2 ([place]) 05/28/2011   2.8 cm invasive 1 node pos ER pos S/P mastectomy 06/1995 then AC x 4 then Tam X 5   . Chest pain, rule out acute myocardial infarction 04/10/2017  . Chronic constipation   . Chronic cystitis 04/09/2017  . Cough 08/23/2007   Qualifier: Diagnosis of  By: Gwenette Greet MD, Armando Reichert   . Depression   . 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 Dysrhythmia 2020   Had Afib  . 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 Fibromyalgia   . GERD (gastroesophageal reflux disease)   . History of adenomatous polyp of colon 09/30/2018  . 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 History of colon polyps    2007 hyperplagia  . History of esophageal dilatation    2008  . History of peptic ulcer   . History of small bowel obstruction    2012  . Hypothyroid 05/28/2011  . Hypothyroidism   . Incomplete emptying of bladder 07/30/2016  . Left foot pain 12/11/2015   Overview:  Dr Cyril Mourning [facility]  . Macular degeneration of right eye   . Mild depression ([place]) 05/28/2011  . Mixed incontinence urge and stress 09/16/2011   S/p lumax desires surgery   . Morbid obesity ([place]) 04/10/2011  . Numbness of left foot    4 toes  . Osteoporosis 12/15/2016   Overview:  Dr Cletis Media  . 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[place])   . Personal history of chemotherapy 1997  . Personal history of radiation therapy 2005  . PONV (postoperative nausea and vomiting)     and HARD TO WAKE  . Seasonal allergies   . Shortness of breath   . SKIN CANCER, HX OF 08/23/2007   Qualifier: Diagnosis of  By: Doy Mince LPN, Megan    . SUI (stress urinary incontinence, female)   . Thrombocytopenic disorder ([place]) 02/25/2017  . Wears glasses     Family History  Problem Relation Age of Onset  . Cancer Father 5       lung and kidney   . Alzheimer's disease Mother   . Heart attack Maternal Grandmother   . Alcohol abuse Brother   . Breast cancer Paternal Aunt   . Breast cancer Cousin   . Breast cancer Cousin  Past Surgical History:  Procedure Laterality Date  . BLADDER SUSPENSION  10/13/2011   Procedure: TRANSVAGINAL TAPE (TVT) PROCEDURE;  Surgeon: Delice Lesch, MD;  Location: [facility];  Service: Gynecology;  Laterality: N/A;  . BREAST BIOPSY Left 03/19/2004  . BREAST LUMPECTOMY Left 03-27-2004  . CARDIAC CATHETERIZATION  10-30-2000  dr Wynonia Lawman   normal coronary arteries and LVF/  ef 65-70%  . CLOSED MANIPULATION  W/ OPEN REDUCTION EXCHANGE TIBIAL FEMORAL BEARING POST TOTAL LEFT KNEE  04-14-2001  . CYSTOSCOPY  10/13/2011   Procedure: CYSTOSCOPY;  Surgeon: Delice Lesch, MD;  Location: [facility];  Service: Gynecology;  Laterality: Bilateral;  . DIAGNOSTIC LAPAROSCOPY     Colon d/t SBO  . DILATION AND CURETTAGE OF UTERUS  1968  . FOOT SURGERY Left 02-01-2009   TRIPLE ARTHRODESIS  . FOOT SURGERY Left 11/14   I&D left foot with woundvac placement  . INCISIONAL HERNIA REPAIR  05/05/2011   Procedure: LAPAROSCOPIC INCISIONAL HERNIA;  Surgeon: Edward Jolly, MD;  Location: [facility];  Service: General;  Laterality: N/A;  LAPAROSCOPIC REPAIR INCARCERATED INCISIONAL HERNIA  WITH MESH  . KNEE ARTHROSCOPY Bilateral left [phone] & 04-1992/   right [phone]  . LAPAROSCOPIC CHOLECYSTECTOMY  06-21-2010  . LEFT HEART CATH AND CORONARY ANGIOGRAPHY N/A 04/13/2017   Procedure: LEFT HEART CATH AND CORONARY ANGIOGRAPHY;  Surgeon: Jettie Booze, MD;  Location: [facility];  Service: Cardiovascular;  Laterality: N/A;  . LUMBAR SPINE SURGERY  1998   L5 -- S1  . MASTECTOMY Right 1997   W/  LYMPH NODE DISSECTION AND RECONSTRUCTION WITH IMPLANTS AND EXPANDER  . NEGATIVE SLEEP STUDY  12/26/2015  . PORT-A-CATH PLACEMENT AND REMOVAL  1997  . REMOVAL RIGHT BREAST IMPLANT AND CAPSULECTOMY  06-03-1999  . REVERSE SHOULDER ARTHROPLASTY Left 10/25/2019   Procedure: LEFT REVERSE SHOULDER ARTHROPLASTY;  Surgeon: Meredith Pel, MD;  Location: [facility];  Service: Orthopedics;  Laterality: Left;  . REVISION TOTAL KNEE ARTHROPLASTY Left 07-07-2005  &  12-10-2001   12-10-2001  REMOVAL TOTAL KNEE/ I&D / INSERTION ANTIBIOTIC SPACER  . REVISION TOTAL KNEE ARTHROPLASTY Right 07-07-2005  . ROTATOR CUFF REPAIR Right   . TENDON REPAIR RIGHT RING FINGER  2011  . THUMB TRIGGER RELEASE Right 1993  . THYROIDECTOMY, PARTIAL  1983  . TONSILLECTOMY  1968  . TOTAL KNEE ARTHROPLASTY Bilateral right 05-04-2000/  left 03-29-2001  . VULVECTOMY  05/27/2012   Procedure: WIDE EXCISION VULVECTOMY;  Surgeon: Imagene Gurney A. Alycia Rossetti, MD;  Location: [facility];  Service: Gynecology;  Laterality: N/A;  Wide Local Excision of Vulva   . VULVECTOMY N/A 11/23/2012   Procedure: WIDE LOCAL EXCISION OF THE VULVA;  Surgeon: Imagene Gurney A. Alycia Rossetti, MD;  Location: [facility];  Service: Gynecology;  Laterality: N/A;  left inferior vulvar biopsy excision left superior lesion left inferior vulvar excision  . VULVECTOMY N/A 08/02/2013   Procedure: WIDE LOCAL  EXCISION VULVA;  Surgeon: Imagene Gurney A. Alycia Rossetti, MD;  Location: [facility];  Service: Gynecology;  Laterality: N/A;  . VULVECTOMY Left 03/29/2014   Procedure: WIDE LOCAL EXCISION OF LEFT VULVA ;  Surgeon: Imagene Gurney A. Alycia Rossetti, MD;  Location: [facility];  Service: Gynecology;  Laterality: Left;  Marland Kitchen VULVECTOMY Bilateral 01/24/2015   Procedure: WIDE LOCAL EXCISION VULVA;  Surgeon: [name], MD;  Location: [facility];  Service: Gynecology;  Laterality:  Bilateral;  . VULVECTOMY PARTIAL  07-16-2010   Social History   Occupational History  . Not on file  Tobacco Use  . Smoking status: Former Smoker    Packs/day: 2.00    Years: 41.00    Pack years: 82.00    Types: Cigarettes    Quit date: 06/05/1999    Years since quitting: 21.1  . Smokeless tobacco: Never Used  Vaping Use  . Vaping Use: Never used  Substance and Sexual Activity  . Alcohol use: No  . Drug use: No  . Sexual activity: Not on file

## 2020-07-16 ENCOUNTER — Inpatient Hospital Stay: Payer: Medicare HMO | Attending: Oncology | Admitting: Genetic Counselor

## 2020-07-16 ENCOUNTER — Other Ambulatory Visit: Payer: Self-pay

## 2020-07-16 ENCOUNTER — Encounter: Payer: Self-pay | Admitting: Genetic Counselor

## 2020-07-16 DIAGNOSIS — Z86000 Personal history of in-situ neoplasm of breast: Secondary | ICD-10-CM

## 2020-07-16 DIAGNOSIS — Z853 Personal history of malignant neoplasm of breast: Secondary | ICD-10-CM

## 2020-07-16 DIAGNOSIS — Z85828 Personal history of other malignant neoplasm of skin: Secondary | ICD-10-CM | POA: Diagnosis not present

## 2020-07-16 DIAGNOSIS — Z808 Family history of malignant neoplasm of other organs or systems: Secondary | ICD-10-CM | POA: Diagnosis not present

## 2020-07-16 DIAGNOSIS — Z1379 Encounter for other screening for genetic and chromosomal anomalies: Secondary | ICD-10-CM

## 2020-07-16 DIAGNOSIS — Z8051 Family history of malignant neoplasm of kidney: Secondary | ICD-10-CM | POA: Diagnosis not present

## 2020-07-16 DIAGNOSIS — D0512 Intraductal carcinoma in situ of left breast: Secondary | ICD-10-CM

## 2020-07-16 DIAGNOSIS — Z803 Family history of malignant neoplasm of breast: Secondary | ICD-10-CM

## 2020-07-16 NOTE — Progress Notes (Signed)
HPI:  [name] was previously seen in the [facility] due to a personal and family history of cancer and concerns regarding a hereditary predisposition to cancer.  [name] recent genetic test results were disclosed to her, as were recommendations warranted by these results. These results and recommendations are discussed in more detail below.  CANCER HISTORY:   In 1997, around the age of 9, [name] was diagnosed with right breast cancer.  Treatment included right mastectomy, adjuvant chemotherapy, and tamoxifen.  In 2005, around the age of 80, [name] was diagnosed with ductal carcinoma in situ of the left breast.  She also has a history of basal cell carcinoma approximately 10 years ago.   Oncology History  Extramammary Paget's disease  03/30/2012 Initial Diagnosis   Extramammary Paget's disease   05/27/2012 Surgery   W[facility] vulva left   11/23/2012 Surgery   [facility] vulva on left   08/02/2013 Surgery   W[facility] vulva   03/29/2014 Surgery   [facility] vulva on left with +  margin 6-9:00 and 12:00   01/24/2015 Surgery   Bilateral [facility]    RISK FACTORS:  Menarche was at age 73.  First full term pregnancy at age 49. History of OCP use. Ovaries intact: yes.  Hysterectomy: no.  Menopausal status: postmenopausal.  HRT use: 0 years. Colonoscopy: yes; most recent in 2020; plans to follow-up this year due to polyps. Mammogram within the last year: yes. Reported exposures: environmental exposures at [employer]:  We obtained a detailed, 4-generation family history.  Significant diagnoses are listed below: Family History  Problem Relation Age of Onset  . Kidney cancer Father 65       metastatic   . Other Mother        ? Cancer  . Bladder Cancer Brother        dx 99s  . Breast cancer Paternal Aunt        dx after 38  . Bladder Cancer Paternal Aunt        dx after 9  . Breast cancer Cousin        two paternal female cousins; unknown age of diagnosis   . Cancer Maternal Aunt        ? gallbladder; dx 75s  . Cancer Maternal Uncle        unknown type; ? pancreatic ? colon; dx after 63  . Brain cancer Paternal Uncle        unknown age of dx  . Melanoma Brother        dx late 29s-early 60s  . Bone cancer Maternal Uncle        dx after 50  . Leukemia Maternal Uncle        dx after 31  . Bone cancer Maternal Uncle        dx after 50  . Lung cancer Maternal Uncle        dx after 50  . Brain cancer Cousin        two maternal female cousins; dx after 78  . Stomach cancer Paternal Aunt        unknown age of dx     No family history of previous hereditary cancer genetic testing was reported.  There is no reported Jewish ancestry.  There is no reported consanguinity.   GENETIC TEST RESULTS: Genetic testing reported out on May 18, 2020.  The [facility] Multi-Cancer Panel found no pathogenic mutations. The Multi-Cancer Panel offered by [facility]  includes sequencing and/or deletion duplication testing of the following 85 genes: AIP, ALK, APC, ATM, AXIN2,BAP1,  BARD1, BLM, BMPR1A, BRCA1, BRCA2, BRIP1, CASR, CDC73, CDH1, CDK4, CDKN1B, CDKN1C, CDKN2A (p14ARF), CDKN2A (p16INK4a), CEBPA, CHEK2, CTNNA1, DICER1, DIS3L2, EGFR (c.2369C>T, p.Thr790Met variant only), EPCAM (Deletion/duplication testing only), FH, FLCN, GATA2, GPC3, GREM1 (Promoter region deletion/duplication testing only), HOXB13 (c.251G>A, p.Gly84Glu), HRAS, KIT, MAX, MEN1, MET, MITF (c.952G>A, p.Glu318Lys variant only), MLH1, MSH2, MSH3, MSH6, MUTYH, NBN, NF1, NF2, NTHL1, PALB2, PDGFRA, PHOX2B, PMS2, POLD1, POLE, POT1, PRKAR1A, PTCH1, PTEN, RAD50, RAD51C, RAD51D, RB1, RECQL4, RET, RNF43, RUNX1, SDHAF2, SDHA (sequence changes only), SDHB, SDHC, SDHD, SMAD4, SMARCA4, SMARCB1, SMARCE1, STK11, SUFU, TERC, TERT, TMEM127, TP53, TSC1, TSC2, VHL, WRN and WT1.   The test report has been scanned into EPIC and is located under the Molecular Pathology section of the Results Review tab.  A portion of the  result report is included below for reference.     We discussed with [name] that because current genetic testing is not perfect, it is possible there may be a gene mutation in one of these genes that current testing cannot detect, but that chance is small.  We also discussed, that there could be another gene that has not yet been discovered, or that we have not yet tested, that is responsible for the cancer diagnoses in the family. It is also possible there is a hereditary cause for the cancer in the family that [name] did not inherit and therefore was not identified in her testing.  Therefore, it is important to remain in touch with cancer genetics in the future so that we can continue to offer [name] the most up to date genetic testing.   Genetic testing did identify a variant of uncertain significance (VUS) was identified in the [facility] gene called c.295T>C (p.Tyr99His).  At this time, it is unknown if this variant is associated with increased cancer risk or if this is a normal finding, but most variants such as this get reclassified to being inconsequential. It should not be used to make medical management decisions. With time, we suspect the lab will determine the significance of this variant, if any. If we do learn more about it, we will try to contact [name] to discuss it further. However, it is important to stay in touch with [place] periodically and keep the address and phone number up to date. ADDITIONAL GENETIC TESTING: We discussed with [name] that her genetic testing was fairly extensive.  If there are genes identified to increase cancer risk that can be analyzed in the future, we would be happy to discuss and coordinate this testing at that time.    CANCER SCREENING RECOMMENDATIONS: [name] test result is considered negative (normal).  This means that we have not identified a hereditary cause for her personal history of breast cancer at this time. Most cancers happen by chance  and this negative test suggests that her cancer may fall into this category.    While reassuring, this does not definitively rule out a hereditary predisposition to cancer. It is still possible that there could be genetic mutations that are undetectable by current technology. There could be genetic mutations in genes that have not been tested or identified to increase cancer risk.  Therefore, it is recommended she continue to follow the cancer management and screening guidelines provided by her oncology and primary healthcare provider.   An individual's cancer risk and medical management are not determined by genetic test results alone. Overall  cancer risk assessment incorporates additional factors, including personal medical history, family history, and any available genetic information that may result in a personalized plan for cancer prevention and surveillance  RECOMMENDATIONS FOR FAMILY MEMBERS:  Individuals in this family might be at some increased risk of developing cancer, over the general population risk, simply due to the family history of cancer.  We recommended women in this family have a yearly mammogram beginning at age 61, or 9 years younger than the earliest onset of cancer, an annual clinical breast exam, and perform monthly breast self-exams. Women in this family should also have a gynecological exam as recommended by their primary provider. All family members should be referred for colonoscopy starting at age 63.  It is also possible there is a hereditary cause for the cancer in [name]'s family that she did not inherit and therefore was not identified in her.  Based on [name] family history, we recommended her maternal family members with a breast cancer history have genetic counseling and testing.   FOLLOW-UP: Lastly, we discussed with [name] that cancer genetics is a rapidly advancing field and it is possible that new genetic tests will be appropriate for her and/or her  family members in the future. We encouraged her to remain in contact with cancer genetics on an annual basis so we can update her personal and family histories and let her know of advances in cancer genetics that may benefit this family.   Our contact number was provided. [name] questions were answered to her satisfaction, and she knows she is welcome to call us at anytime with additional questions or concerns.    Korinne Greenstein M. Joette Catching, [place], [facility] Genetic Counselor [email or web address] (P) [phone]

## 2020-08-14 ENCOUNTER — Ambulatory Visit: Payer: Medicare HMO | Admitting: Cardiology

## 2020-08-14 ENCOUNTER — Encounter: Payer: Self-pay | Admitting: Cardiology

## 2020-08-14 ENCOUNTER — Other Ambulatory Visit: Payer: Self-pay

## 2020-08-14 VITALS — BP 154/74 | HR 66 | Ht 65.0 in | Wt 266.0 lb

## 2020-08-14 DIAGNOSIS — I493 Ventricular premature depolarization: Secondary | ICD-10-CM | POA: Diagnosis not present

## 2020-08-14 MED ORDER — FUROSEMIDE 40 MG PO TABS: ORAL_TABLET | ORAL | 0 refills | Status: DC

## 2020-08-14 MED ORDER — POTASSIUM CHLORIDE ER 10 MEQ PO TBCR: EXTENDED_RELEASE_TABLET | ORAL | 0 refills | Status: DC

## 2020-08-14 NOTE — Patient Instructions (Signed)
Medication Instructions:  Your physician has recommended you make the following change in your medication:  1. INCREASE Lasix to 40 mg TWICE daily for the next 7 days, then return to normal dosing. 2. INCREASE Potassium to 20 mEq daily for the next 7 days, then return to normal dosing.  *If you need a refill on your cardiac medications before your next appointment, please call your pharmacy*   Lab Work: None ordered   Testing/Procedures: None ordered   Follow-Up: At [facility], you and your health needs are our priority.  As part of our continuing mission to provide you with exceptional heart care, we have created designated Provider Care Teams.  These Care Teams include your primary Cardiologist (physician) and Advanced Practice Providers (APPs -  Physician Assistants and Nurse Practitioners) who all work together to provide you with the care you need, when you need it.  We recommend signing up for the patient portal called "MyChart".  Sign up information is provided on this After Visit Summary.  MyChart is used to connect with patients for Virtual Visits (Telemedicine).  Patients are able to view lab/test results, encounter notes, upcoming appointments, etc.  Non-urgent messages can be sent to your provider as well.   To learn more about what you can do with MyChart, go to [email or web address].    Your next appointment:   2 - 4 week(s)  The format for your next appointment:   In Person  Provider:   You may see one of the following Advanced Practice Providers on your designated Care Team:    Chanetta Marshall, NP  Tommye Standard, PA-C  Legrand Como "[place]" [place], [place]     Thank you for choosing [facility]!!   Trinidad Curet, RN [phone]   Other Instructions

## 2020-08-14 NOTE — Progress Notes (Signed)
Electrophysiology Office Note   Date:  08/14/2020   ID:  [name], DOB 1946/04/26, MRN [id number]  PCP:  Pcp, No  Cardiologist:  [name] Primary Electrophysiologist:  Will Vanessa Leeds, MD    Chief Complaint: PVC   History of Present Illness: [name] is a 75 y.o. female who is being seen today for the evaluation of PVC at the request of No ref. provider found. Presenting today for electrophysiology evaluation.  She has a history of diastolic heart failure, nonobstructive coronary artery disease, and PVCs.  She had normal Myoview in 2020.  She wore a 48-hour monitor that showed a 30% PVC burden.  She was started on mexiletine which reduced her PVC burden down to 8.4%.  Today, denies symptoms of palpitations, chest pain,  orthopnea, PND, lower extremity edema, claudication, dizziness, presyncope, syncope, bleeding, or neurologic sequela. The patient is tolerating medications without difficulties.  She is unaware of palpitations.  She has no chest pain.  Her issue is shortness of breath, increased abdominal girth, and lower extremity swelling.  She states that she gets short of breath when doing much walking at all.  She does have Lasix at home and has been taking it once a day.  She has been instructed in the past that she can take it twice a day if necessary.  Past Medical History:  Diagnosis Date  . ACS (acute coronary syndrome) ([place]) 04/11/2017  . ALLERGIC RHINITIS 08/23/2007   Qualifier: Diagnosis of  By: Vanessa Mince LPN, [name]    . Anxiety   . Arthritis   . Asthma   . ASTHMA 08/23/2007   Qualifier: Diagnosis of  By: Vanessa Mince LPN, [name]    . Bigeminy 04/10/2017  . 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 Cancer of right breast, stage 2 ([name]) 05/28/2011   2.8 cm invasive 1 node pos ER pos S/P mastectomy 06/1995 then AC x 4 then Tam X 5   . Chest pain, rule out acute myocardial infarction 04/10/2017  . Chronic constipation   . Chronic cystitis  04/09/2017  . Cough 08/23/2007   Qualifier: Diagnosis of  By: Gwenette Greet MD, Armando Reichert   . Depression   . 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 Dysrhythmia 2020   Had Afib  . 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 Fibromyalgia   . GERD (gastroesophageal reflux disease)   . History of adenomatous polyp of colon 09/30/2018  . 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 History of colon polyps    2007 hyperplagia  . History of esophageal dilatation    2008  . History of peptic ulcer   . History of small bowel obstruction    2012  . Hypothyroid 05/28/2011  . Hypothyroidism   . Incomplete emptying of bladder 07/30/2016  . Left foot pain 12/11/2015   Overview:  Dr Cyril Mourning [facility]  . Macular degeneration of right eye   . Mild depression ([name]) 05/28/2011  . Mixed incontinence urge and stress 09/16/2011   S/p lumax desires surgery   . Morbid obesity ([name]) 04/10/2011  . Numbness of left foot    4 toes  . Osteoporosis 12/15/2016   Overview:  Dr Cletis Media  . 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[name])   .  Personal history of chemotherapy 1997  . Personal history of radiation therapy 2005  . PONV (postoperative nausea and vomiting)    and HARD TO WAKE  . Seasonal allergies   . Shortness of breath   . SKIN CANCER, HX OF 08/23/2007   Qualifier: Diagnosis of  By: Vanessa Mince LPN, [name]    . SUI (stress urinary incontinence, female)   . Thrombocytopenic disorder ([name]) 02/25/2017  . Wears glasses    Past Surgical History:  Procedure Laterality Date  . BLADDER SUSPENSION  10/13/2011   Procedure: TRANSVAGINAL TAPE (TVT) PROCEDURE;  Surgeon: Vanessa Lesch, MD;  Location: [facility];  Service: Gynecology;  Laterality: N/A;  . BREAST BIOPSY Left 03/19/2004  . BREAST LUMPECTOMY Left 03-27-2004  .  CARDIAC CATHETERIZATION  10-30-2000  dr Wynonia Lawman   normal coronary arteries and LVF/  ef 65-70%  . CLOSED MANIPULATION  W/ OPEN REDUCTION EXCHANGE TIBIAL FEMORAL BEARING POST TOTAL LEFT KNEE  04-14-2001  . CYSTOSCOPY  10/13/2011   Procedure: CYSTOSCOPY;  Surgeon: Vanessa Lesch, MD;  Location: [facility];  Service: Gynecology;  Laterality: Bilateral;  . DIAGNOSTIC LAPAROSCOPY     Colon d/t SBO  . DILATION AND CURETTAGE OF UTERUS  1968  . FOOT SURGERY Left 02-01-2009   TRIPLE ARTHRODESIS  . FOOT SURGERY Left 11/14   I&D left foot with woundvac placement  . INCISIONAL HERNIA REPAIR  05/05/2011   Procedure: LAPAROSCOPIC INCISIONAL HERNIA;  Surgeon: Vanessa Jolly, MD;  Location: [facility];  Service: General;  Laterality: N/A;  LAPAROSCOPIC REPAIR INCARCERATED INCISIONAL HERNIA  WITH MESH  . KNEE ARTHROSCOPY Bilateral left [phone] & 04-1992/   right [phone]  . LAPAROSCOPIC CHOLECYSTECTOMY  06-21-2010  . LEFT HEART CATH AND CORONARY ANGIOGRAPHY N/A 04/13/2017   Procedure: LEFT HEART CATH AND CORONARY ANGIOGRAPHY;  Surgeon: Vanessa Booze, MD;  Location: [facility];  Service: Cardiovascular;  Laterality: N/A;  . LUMBAR SPINE SURGERY  1998   L5 -- S1  . MASTECTOMY Right 1997   W/  LYMPH NODE DISSECTION AND RECONSTRUCTION WITH IMPLANTS AND EXPANDER  . NEGATIVE SLEEP STUDY  12/26/2015  . PORT-A-CATH PLACEMENT AND REMOVAL  1997  . REMOVAL RIGHT BREAST IMPLANT AND CAPSULECTOMY  06-03-1999  . REVERSE SHOULDER ARTHROPLASTY Left 10/25/2019   Procedure: LEFT REVERSE SHOULDER ARTHROPLASTY;  Surgeon: Vanessa Pel, MD;  Location: [facility];  Service: Orthopedics;  Laterality: Left;  . REVISION TOTAL KNEE ARTHROPLASTY Left 07-07-2005  &  12-10-2001   12-10-2001  REMOVAL TOTAL KNEE/ I&D / INSERTION ANTIBIOTIC SPACER  . REVISION TOTAL KNEE ARTHROPLASTY Right 07-07-2005  . ROTATOR CUFF REPAIR Right   . TENDON REPAIR RIGHT RING FINGER  2011  . THUMB TRIGGER RELEASE Right 1993  . THYROIDECTOMY,  PARTIAL  1983  . TONSILLECTOMY  1968  . TOTAL KNEE ARTHROPLASTY Bilateral right 05-04-2000/  left 03-29-2001  . VULVECTOMY  05/27/2012   Procedure: WIDE EXCISION VULVECTOMY;  Surgeon: Imagene Gurney A. Alycia Rossetti, MD;  Location: [facility];  Service: Gynecology;  Laterality: N/A;  Wide Local Excision of Vulva   . VULVECTOMY N/A 11/23/2012   Procedure: WIDE LOCAL EXCISION OF THE VULVA;  Surgeon: Imagene Gurney A. Alycia Rossetti, MD;  Location: [facility];  Service: Gynecology;  Laterality: N/A;  left inferior vulvar biopsy excision left superior lesion left inferior vulvar excision  . VULVECTOMY N/A 08/02/2013   Procedure: WIDE LOCAL  EXCISION VULVA;  Surgeon: Imagene Gurney A. Alycia Rossetti, MD;  Location: [facility];  Service: Gynecology;  Laterality: N/A;  . VULVECTOMY Left  03/29/2014   Procedure: WIDE LOCAL EXCISION OF LEFT VULVA ;  Surgeon: Imagene Gurney A. Alycia Rossetti, MD;  Location: [facility];  Service: Gynecology;  Laterality: Left;  Marland Kitchen VULVECTOMY Bilateral 01/24/2015   Procedure: WIDE LOCAL EXCISION VULVA;  Surgeon: [name], MD;  Location: [place];  Service: Gynecology;  Laterality: Bilateral;  . VULVECTOMY PARTIAL  07-16-2010     Current Outpatient Medications  Medication Sig Dispense Refill  . acetaminophen (TYLENOL) 500 MG tablet Take 1,000 mg by mouth every 6 (six) hours as needed for mild pain or moderate pain.    Marland Kitchen albuterol (PROVENTIL HFA;VENTOLIN HFA) 108 (90 BASE) MCG/ACT inhaler Inhale 2 puffs into the lungs every 4 (four) hours as needed for wheezing or shortness of breath. Reported on 08/01/2015    . aspirin EC 81 MG EC tablet Take 1 tablet (81 mg total) daily by mouth. 30 tablet 0  . atorvastatin (LIPITOR) 40 MG tablet Take 1 tablet (40 mg total) daily at 6 PM by mouth. 30 tablet 0  . bisoprolol (ZEBETA) 5 MG tablet TAKE 1 TABLET BY MOUTH EVERY DAY 90 tablet 3  . budesonide-formoterol (SYMBICORT) 160-4.5 MCG/ACT inhaler Inhale 2 puffs into the lungs daily as needed (Wheezing in the winter).     Marland Kitchen buPROPion  (WELLBUTRIN XL) 150 MG 24 hr tablet Take 150 mg by mouth every morning.    . celecoxib (CELEBREX) 200 MG capsule Take 1 capsule (200 mg total) by mouth daily. 30 capsule 0  . Cholecalciferol (VITAMIN D3) 1000 UNITS CAPS Take 2,000 Units by mouth daily.     . citalopram (CELEXA) 40 MG tablet Take 40 mg by mouth every morning.    . citalopram (CELEXA) 40 MG tablet Take 1 tablet by mouth daily.    . clindamycin (CLEOCIN) 150 MG capsule Take 150 mg by mouth 3 (three) times daily.    . furosemide (LASIX) 40 MG tablet Take 1 tablet (40 mg total) by mouth 2 (two) times daily. 180 tablet 2  . hydrOXYzine (VISTARIL) 25 MG capsule Take 25 mg by mouth 3 (three) times daily as needed for itching.    . ibandronate (BONIVA) 150 MG tablet Take 150 mg by mouth every 30 (thirty) days. Take in the morning with a full glass of water, on an empty stomach, and do not take anything else by mouth or lie down for the next 30 min.    Marland Kitchen ipratropium-albuterol (DUONEB) 0.5-2.5 (3) MG/3ML SOLN Take 3 mLs by nebulization every 4 (four) hours as needed (Wheezing).     Marland Kitchen ketoconazole (NIZORAL) 2 % shampoo Apply 1 application topically every 14 (fourteen) days.    Marland Kitchen levothyroxine (SYNTHROID) 200 MCG tablet Take 200 mcg by mouth daily before breakfast.     . methocarbamol (ROBAXIN) 500 MG tablet TAKE 1 TABLET BY MOUTH EVERY 8 HOURS AS NEEDED 30 tablet 0  . methocarbamol (ROBAXIN) 500 MG tablet Take 1 tablet (500 mg total) by mouth every 8 (eight) hours as needed for muscle spasms. 40 tablet 0  . mexiletine (MEXITIL) 150 MG capsule TAKE 1 CAPSULE (150 MG TOTAL) BY MOUTH 3 (THREE) TIMES DAILY. 90 capsule 2  . montelukast (SINGULAIR) 10 MG tablet Take 10 mg daily by mouth.     . Multiple Vitamin (MULTIVITAMIN WITH MINERALS) TABS tablet Take 1 tablet daily by mouth.    . nystatin (MYCOSTATIN/NYSTOP) powder Apply topically 4 (four) times daily. 60 g 6  . nystatin cream (MYCOSTATIN) Apply 1 application topically 2 (two)  times daily. 30 g  6  . pantoprazole (PROTONIX) 40 MG tablet Take 40 mg by mouth daily.    . polyvinyl alcohol (LIQUIFILM TEARS) 1.4 % ophthalmic solution Place 1 drop into both eyes daily as needed for dry eyes.     . potassium chloride (K-DUR) 10 MEQ tablet Take 10 mEq by mouth daily.      No current facility-administered medications for this visit.    Allergies:   Tape, Hydromorphone hcl, Macrobid [nitrofurantoin], Morphine and related, Penicillins, and Valisone [betamethasone]   Social History:  The patient  reports that she quit smoking about 21 years ago. Her smoking use included cigarettes. She has a 82.00 pack-year smoking history. She has never used smokeless tobacco. She reports that she does not drink alcohol and does not use drugs.   Family History:  The patient's family history includes Alcohol abuse in her brother; Alzheimer's disease in her mother; Bladder Cancer in her brother and paternal aunt; Bone cancer in her maternal uncle and maternal uncle; Brain cancer in her cousin and paternal uncle; Breast cancer in her cousin and paternal aunt; Cancer in her maternal aunt and maternal uncle; Heart attack in her maternal grandmother; Kidney cancer (age of onset: 72) in her father; Leukemia in her maternal uncle; Lung cancer in her maternal uncle; Melanoma in her brother; Other in her mother; Stomach cancer in her paternal aunt.   ROS:  Please see the history of present illness.   Otherwise, review of systems is positive for none.   All other systems are reviewed and negative.   PHYSICAL EXAM: VS:  BP (!) 154/74   Pulse 66   Ht 5\' 5"  (1.651 m)   Wt 266 lb (120.7 kg)   SpO2 96%   BMI 44.26 kg/m  , BMI Body mass index is 44.26 kg/m. GEN: Well nourished, well developed, in no acute distress  HEENT: normal  Neck: no JVD, carotid bruits, or masses Cardiac: RRR; no murmurs, rubs, or gallops,no edema  Respiratory:  clear to auscultation bilaterally, normal work of breathing GI: soft, nontender,  nondistended, + BS MS: no deformity or atrophy  Skin: warm and dry Neuro:  Strength and sensation are intact Psych: euthymic mood, full affect  EKG:  EKG is ordered today. Personal review of the ekg ordered shows sinus rhythm, rate 66  Recent Labs:  05/10/2020: ALT 17; BUN 10; Creatinine 0.77; Hemoglobin 12.7; Platelet Count 127; Potassium 4.5; Sodium 147    Lipid Panel  No results found for: CHOL, TRIG, HDL, CHOLHDL, VLDL, LDLCALC, LDLDIRECT   Wt Readings from Last 3 Encounters:  08/14/20 266 lb (120.7 kg)  05/02/20 256 lb 6.4 oz (116.3 kg)  02/28/20 246 lb 12.8 oz (111.9 kg)      Other studies Reviewed: Additional studies/ records that were reviewed today include: TTE  07/13/19 Review of the above records today demonstrates:  Wall thickness is normal. Systolic  function is low normal. EF: 50-55%. Wall motion is within normal limits.  No apical thrombus Doppler parameters consistent with restrictive filling  pattern and markedly elevated LA pressure. Mitral valve E/A ratio 2.4. Mitral valve structure is normal. There is mild to moderate mitral  regurgitation.  Cardiac monitor 02/17/2020 personally reviewed Max 130 bpm 09:07pm, 09/15 Min 51 bpm 04:06am, 09/17 Avg 64 bpm Less than 1% PACs 8.7% PVCs Predominant rhythm was sinus rhythm Symptoms associated with sinus rhythm and artifact   ASSESSMENT AND PLAN:  1.  PVCs: Burden of 30% on Holter monitor.  PVCs appear to be coming from the RV base, though they are atypical from outflow tract PVCs.  Currently on mexiletine.  Monitoring for high risk medication.  Down to 8.4% on most recent monitor.  No changes.    2.  Chronic diastolic heart failure: She has lower extremity edema and quite a bit of shortness of breath.  She has not taken her Lasix today.  She takes it twice daily at times.  I have asked her to take 40 mg twice daily for the next week.  She will also increase her potassium.  I will have her see an EP after in  1 month to determine if she remains volume overloaded.  Current medicines are reviewed at length with the patient today.   The patient does not have concerns regarding her medicines.  The following changes were made today: None  Labs/ tests ordered today include:  Orders Placed This Encounter  Procedures  . EKG 12-Lead     Disposition:   FU  1 months  Signed, Will Vanessa Leeds, MD  08/14/2020 4:04 PM     [address] [facility] [place] [address] [phone] (office) [phone] (fax)

## 2020-08-20 ENCOUNTER — Encounter: Payer: Self-pay | Admitting: Podiatry

## 2020-08-20 ENCOUNTER — Other Ambulatory Visit: Payer: Self-pay

## 2020-08-20 ENCOUNTER — Ambulatory Visit: Payer: Medicare HMO | Admitting: Podiatry

## 2020-08-20 DIAGNOSIS — L84 Corns and callosities: Secondary | ICD-10-CM

## 2020-08-22 NOTE — Progress Notes (Signed)
Subjective:   Patient ID: [name], female   DOB: 75 y.o.   MRN: [id number]   HPI Patient presents stating she has 2 painful calluses on the left foot that make it hard for her to walk   ROS      Objective:  Physical Exam  Neurovascular status unchanged with thick keratotic lesion subthird metatarsal left subfifth metatarsal base left with keratotic tissue formation     Assessment:  Chronic lesion x2 left with foot structural issues creating increased pressure points     Plan:  H&P done debridement of lesions did have a small amount of bleeding underneath the third metatarsal I applied Neosporin with dressing and advised on soaks and padding

## 2020-09-04 ENCOUNTER — Encounter: Payer: Self-pay | Admitting: Student

## 2020-09-04 ENCOUNTER — Other Ambulatory Visit: Payer: Self-pay

## 2020-09-04 ENCOUNTER — Ambulatory Visit: Payer: Medicare HMO | Admitting: Student

## 2020-09-04 VITALS — BP 140/70 | HR 63 | Ht 65.0 in | Wt 254.0 lb

## 2020-09-04 DIAGNOSIS — I5032 Chronic diastolic (congestive) heart failure: Secondary | ICD-10-CM

## 2020-09-04 DIAGNOSIS — I493 Ventricular premature depolarization: Secondary | ICD-10-CM | POA: Diagnosis not present

## 2020-09-04 LAB — BASIC METABOLIC PANEL
BUN/Creatinine Ratio: 26 (ref 12–28)
BUN: 14 mg/dL (ref 8–27)
CO2: 26 mmol/L (ref 20–29)
Calcium: 9 mg/dL (ref 8.7–10.3)
Chloride: 99 mmol/L (ref 96–106)
Creatinine, Ser: 0.53 mg/dL — ABNORMAL LOW (ref 0.57–1.00)
Glucose: 96 mg/dL (ref 65–99)
Potassium: 4.7 mmol/L (ref 3.5–5.2)
Sodium: 139 mmol/L (ref 134–144)
eGFR: 97 mL/min/{1.73_m2} (ref >59)

## 2020-09-04 MED ORDER — POTASSIUM CHLORIDE ER 10 MEQ PO TBCR: 10.0000 meq | EXTENDED_RELEASE_TABLET | Freq: Every day | ORAL | 3 refills | Status: DC

## 2020-09-04 MED ORDER — FUROSEMIDE 40 MG PO TABS: 40.0000 mg | ORAL_TABLET | Freq: Every day | ORAL | 3 refills | Status: DC

## 2020-09-04 NOTE — Patient Instructions (Signed)
Medication Instructions:  Your physician has recommended you make the following change in your medication:   CHANGE: Take Furosemide 40mg  daily. Weigh daily and if you gain 3lbs overnight or 5lbs in one week take an extra                   Furosemide. CHANGE: Potassium to 70meq daily. Take extra on the days you take an extra Furosemide  *If you need a refill on your cardiac medications before your next appointment, please call your pharmacy*   Lab Work: TODAY: BMET  If you have labs (blood work) drawn today and your tests are completely normal, you will receive your results only by: Marland Kitchen MyChart Message (if you have MyChart) OR . A paper copy in the mail If you have any lab test that is abnormal or we need to change your treatment, we will call you to review the results.  Follow-Up: At [facility], you and your health needs are our priority.  As part of our continuing mission to provide you with exceptional heart care, we have created designated Provider Care Teams.  These Care Teams include your primary Cardiologist (physician) and Advanced Practice Providers (APPs -  Physician Assistants and Nurse Practitioners) who all work together to provide you with the care you need, when you need it.  Your next appointment:   As scheduled with Oda Kilts, PA

## 2020-09-04 NOTE — Progress Notes (Signed)
PCP:  Chesley Noon, MD Primary Cardiologist: No primary care provider on file. Electrophysiologist: Will Meredith Leeds, MD   [name] is a 75 y.o. female seen today for Will Meredith Leeds, MD for routine electrophysiology followup.  Since last being seen in our clinic the patient reports doing better. Lasix has improved her edema and she is down approx 12 lbs. Reviewed salt and fluid restrictions. She is drinking 2-30oz glasses of Mt. Dew and 4-5 bottles of water daily. Denies orthopnea. Not able to be very active due to history of left total knee and complications requiring left foot/ankle surgery.   Past Medical History:  Diagnosis Date  . ACS (acute coronary syndrome) ([place]) 04/11/2017  . ALLERGIC RHINITIS 08/23/2007   Qualifier: Diagnosis of  By: Doy Mince LPN, Megan    . Anxiety   . Arthritis   . Asthma   . ASTHMA 08/23/2007   Qualifier: Diagnosis of  By: Doy Mince LPN, Megan    . Bigeminy 04/10/2017  . 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 Cancer of right breast, stage 2 ([place]) 05/28/2011   2.8 cm invasive 1 node pos ER pos S/P mastectomy 06/1995 then AC x 4 then Tam X 5   . Chest pain, rule out acute myocardial infarction 04/10/2017  . Chronic constipation   . Chronic cystitis 04/09/2017  . Cough 08/23/2007   Qualifier: Diagnosis of  By: Gwenette Greet MD, Armando Reichert   . Depression   . 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 Dysrhythmia 2020   Had Afib  . 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 Fibromyalgia   . GERD (gastroesophageal reflux disease)   . History of adenomatous polyp of colon 09/30/2018  . 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 History of colon polyps    2007 hyperplagia  . History of esophageal dilatation     2008  . History of peptic ulcer   . History of small bowel obstruction    2012  . Hypothyroid 05/28/2011  . Hypothyroidism   . Incomplete emptying of bladder 07/30/2016  . Left foot pain 12/11/2015   Overview:  Dr Cyril Mourning [facility]  . Macular degeneration of right eye   . Mild depression ([place]) 05/28/2011  . Mixed incontinence urge and stress 09/16/2011   S/p lumax desires surgery   . Morbid obesity ([place]) 04/10/2011  . Numbness of left foot    4 toes  . Osteoporosis 12/15/2016   Overview:  Dr Cletis Media  . 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[place])   . Personal history of chemotherapy 1997  . Personal history of radiation therapy 2005  . PONV (postoperative nausea and vomiting)    and HARD TO WAKE  . Seasonal allergies   . Shortness of breath   . SKIN CANCER, HX OF 08/23/2007   Qualifier: Diagnosis of  By: Doy Mince LPN, Megan    . SUI (stress urinary incontinence, female)   . Thrombocytopenic disorder ([place]) 02/25/2017  . Wears glasses    Past Surgical History:  Procedure Laterality Date  . BLADDER SUSPENSION  10/13/2011   Procedure: TRANSVAGINAL TAPE (TVT) PROCEDURE;  Surgeon: Delice Lesch, MD;  Location: [facility];  Service: Gynecology;  Laterality: N/A;  . BREAST BIOPSY Left 03/19/2004  .  BREAST LUMPECTOMY Left 03-27-2004  . CARDIAC CATHETERIZATION  10-30-2000  dr Wynonia Lawman   normal coronary arteries and LVF/  ef 65-70%  . CLOSED MANIPULATION  W/ OPEN REDUCTION EXCHANGE TIBIAL FEMORAL BEARING POST TOTAL LEFT KNEE  04-14-2001  . CYSTOSCOPY  10/13/2011   Procedure: CYSTOSCOPY;  Surgeon: Delice Lesch, MD;  Location: [facility];  Service: Gynecology;  Laterality: Bilateral;  . DIAGNOSTIC LAPAROSCOPY     Colon d/t SBO  . DILATION AND CURETTAGE OF UTERUS  1968  . FOOT SURGERY Left 02-01-2009   TRIPLE ARTHRODESIS  . FOOT SURGERY Left 11/14   I&D left foot with woundvac placement  . INCISIONAL HERNIA REPAIR  05/05/2011   Procedure: LAPAROSCOPIC INCISIONAL HERNIA;  Surgeon: Edward Jolly, MD;   Location: [facility];  Service: General;  Laterality: N/A;  LAPAROSCOPIC REPAIR INCARCERATED INCISIONAL HERNIA  WITH MESH  . KNEE ARTHROSCOPY Bilateral left [phone] & 04-1992/   right [phone]  . LAPAROSCOPIC CHOLECYSTECTOMY  06-21-2010  . LEFT HEART CATH AND CORONARY ANGIOGRAPHY N/A 04/13/2017   Procedure: LEFT HEART CATH AND CORONARY ANGIOGRAPHY;  Surgeon: Jettie Booze, MD;  Location: [facility];  Service: Cardiovascular;  Laterality: N/A;  . LUMBAR SPINE SURGERY  1998   L5 -- S1  . MASTECTOMY Right 1997   W/  LYMPH NODE DISSECTION AND RECONSTRUCTION WITH IMPLANTS AND EXPANDER  . NEGATIVE SLEEP STUDY  12/26/2015  . PORT-A-CATH PLACEMENT AND REMOVAL  1997  . REMOVAL RIGHT BREAST IMPLANT AND CAPSULECTOMY  06-03-1999  . REVERSE SHOULDER ARTHROPLASTY Left 10/25/2019   Procedure: LEFT REVERSE SHOULDER ARTHROPLASTY;  Surgeon: Meredith Pel, MD;  Location: [facility];  Service: Orthopedics;  Laterality: Left;  . REVISION TOTAL KNEE ARTHROPLASTY Left 07-07-2005  &  12-10-2001   12-10-2001  REMOVAL TOTAL KNEE/ I&D / INSERTION ANTIBIOTIC SPACER  . REVISION TOTAL KNEE ARTHROPLASTY Right 07-07-2005  . ROTATOR CUFF REPAIR Right   . TENDON REPAIR RIGHT RING FINGER  2011  . THUMB TRIGGER RELEASE Right 1993  . THYROIDECTOMY, PARTIAL  1983  . TONSILLECTOMY  1968  . TOTAL KNEE ARTHROPLASTY Bilateral right 05-04-2000/  left 03-29-2001  . VULVECTOMY  05/27/2012   Procedure: WIDE EXCISION VULVECTOMY;  Surgeon: Imagene Gurney A. Alycia Rossetti, MD;  Location: [facility];  Service: Gynecology;  Laterality: N/A;  Wide Local Excision of Vulva   . VULVECTOMY N/A 11/23/2012   Procedure: WIDE LOCAL EXCISION OF THE VULVA;  Surgeon: Imagene Gurney A. Alycia Rossetti, MD;  Location: [facility];  Service: Gynecology;  Laterality: N/A;  left inferior vulvar biopsy excision left superior lesion left inferior vulvar excision  . VULVECTOMY N/A 08/02/2013   Procedure: WIDE LOCAL  EXCISION VULVA;  Surgeon: Imagene Gurney A. Alycia Rossetti, MD;  Location: [facility];  Service:  Gynecology;  Laterality: N/A;  . VULVECTOMY Left 03/29/2014   Procedure: WIDE LOCAL EXCISION OF LEFT VULVA ;  Surgeon: Imagene Gurney A. Alycia Rossetti, MD;  Location[facility];  Service: Gynecology;  Laterality: Left;  Marland Kitchen VULVECTOMY Bilateral 01/24/2015   Procedure: WIDE LOCAL EXCISION VULVA;  Surgeon: [name], MD;  Location: [facility];  Service: Gynecology;  Laterality: Bilateral;  . VULVECTOMY PARTIAL  07-16-2010    Current Outpatient Medications  Medication Sig Dispense Refill  . acetaminophen (TYLENOL) 500 MG tablet Take 1,000 mg by mouth every 6 (six) hours as needed for mild pain or moderate pain.    Marland Kitchen albuterol (PROVENTIL HFA;VENTOLIN HFA) 108 (90 BASE) MCG/ACT inhaler Inhale 2 puffs into the lungs every 4 (four) hours  as needed for wheezing or shortness of breath. Reported on 08/01/2015    . aspirin EC 81 MG EC tablet Take 1 tablet (81 mg total) daily by mouth. 30 tablet 0  . atorvastatin (LIPITOR) 40 MG tablet Take 1 tablet (40 mg total) daily at 6 PM by mouth. 30 tablet 0  . bisoprolol (ZEBETA) 5 MG tablet TAKE 1 TABLET BY MOUTH EVERY DAY 90 tablet 3  . budesonide-formoterol (SYMBICORT) 160-4.5 MCG/ACT inhaler Inhale 2 puffs into the lungs daily as needed (Wheezing in the winter).     Marland Kitchen buPROPion (WELLBUTRIN XL) 150 MG 24 hr tablet Take 150 mg by mouth every morning.    . celecoxib (CELEBREX) 200 MG capsule Take 1 capsule (200 mg total) by mouth daily. 30 capsule 0  . Cholecalciferol (VITAMIN D3) 1000 UNITS CAPS Take 2,000 Units by mouth daily.     . citalopram (CELEXA) 40 MG tablet Take 1 tablet by mouth daily.    . clindamycin (CLEOCIN) 150 MG capsule Take 150 mg by mouth 3 (three) times daily. Only foe dental visits    . furosemide (LASIX) 40 MG tablet Take 1 tablet (40 mg total) by mouth 2 (two) times daily. 180 tablet 2  . furosemide (LASIX) 40 MG tablet Take Lasix 40 mg twice a day for the next 7 days, then return to normal dosing 7 tablet 0  . hydrOXYzine  (VISTARIL) 25 MG capsule Take 25 mg by mouth 3 (three) times daily as needed for itching.    . ibandronate (BONIVA) 150 MG tablet Take 150 mg by mouth every 30 (thirty) days. Take in the morning with a full glass of water, on an empty stomach, and do not take anything else by mouth or lie down for the next 30 min.    Marland Kitchen ipratropium-albuterol (DUONEB) 0.5-2.5 (3) MG/3ML SOLN Take 3 mLs by nebulization every 4 (four) hours as needed (Wheezing).     Marland Kitchen ketoconazole (NIZORAL) 2 % shampoo Apply 1 application topically every 14 (fourteen) days.    Marland Kitchen levothyroxine (SYNTHROID) 200 MCG tablet Take 200 mcg by mouth daily before breakfast.     . methocarbamol (ROBAXIN) 500 MG tablet TAKE 1 TABLET BY MOUTH EVERY 8 HOURS AS NEEDED 30 tablet 0  . mexiletine (MEXITIL) 150 MG capsule TAKE 1 CAPSULE (150 MG TOTAL) BY MOUTH 3 (THREE) TIMES DAILY. 90 capsule 2  . montelukast (SINGULAIR) 10 MG tablet Take 10 mg daily by mouth.     . Multiple Vitamin (MULTIVITAMIN WITH MINERALS) TABS tablet Take 1 tablet daily by mouth.    . nystatin (MYCOSTATIN/NYSTOP) powder Apply topically 4 (four) times daily. 60 g 6  . nystatin cream (MYCOSTATIN) Apply 1 application topically 2 (two) times daily. 30 g 6  . pantoprazole (PROTONIX) 40 MG tablet Take 40 mg by mouth daily.    . polyvinyl alcohol (LIQUIFILM TEARS) 1.4 % ophthalmic solution Place 1 drop into both eyes daily as needed for dry eyes.     . potassium chloride (K-DUR) 10 MEQ tablet Take 20 mEq by mouth daily. Two tabs daily     No current facility-administered medications for this visit.    Allergies  Allergen Reactions  . Tape     "thick clear plastic tape" causes blisters   . Hydromorphone Hcl Itching and Rash  . Macrobid [Nitrofurantoin] Rash  . Morphine And Related Itching and Rash  . Penicillins Hives and Swelling    1968  . Valisone [Betamethasone] Rash    Social History  Socioeconomic History  . Marital status: Married    Spouse name: Not on file  .  Number of children: Not on file  . Years of education: Not on file  . Highest education level: Not on file  Occupational History  . Not on file  Tobacco Use  . Smoking status: Former Smoker    Packs/day: 2.00    Years: 41.00    Pack years: 82.00    Types: Cigarettes    Quit date: 06/05/1999    Years since quitting: 21.2  . Smokeless tobacco: Never Used  Vaping Use  . Vaping Use: Never used  Substance and Sexual Activity  . Alcohol use: No  . Drug use: No  . Sexual activity: Not on file  Other Topics Concern  . Not on file  Social History Narrative  . Not on file   Social Determinants of Health   Financial Resource Strain: Not on file  Food Insecurity: Not on file  Transportation Needs: Not on file  Physical Activity: Not on file  Stress: Not on file  Social Connections: Not on file  Intimate Partner Violence: Not on file     Review of Systems: General: No chills, fever, night sweats or weight changes  Cardiovascular:  No chest pain, dyspnea on exertion, edema, orthopnea, palpitations, paroxysmal nocturnal dyspnea Dermatological: No rash, lesions or masses Respiratory: No cough, dyspnea Urologic: No hematuria, dysuria Abdominal: No nausea, vomiting, diarrhea, bright red blood per rectum, melena, or hematemesis Neurologic: No visual changes, weakness, changes in mental status All other systems reviewed and are otherwise negative except as noted above.  Physical Exam: Vitals:   09/04/20 1045  BP: 140/70  Pulse: 63  SpO2: 98%  Weight: 254 lb (115.2 kg)  Height: 5\' 5"  (1.651 m)   Wt Readings from Last 3 Encounters:  09/04/20 254 lb (115.2 kg)  08/14/20 266 lb (120.7 kg)  05/02/20 256 lb 6.4 oz (116.3 kg)     GEN- The patient is well appearing, alert and oriented x 3 today.   HEENT: normocephalic, atraumatic; sclera clear, conjunctiva pink; hearing intact; oropharynx clear; neck supple, no JVP Lymph- no cervical lymphadenopathy Lungs- Clear to ausculation  bilaterally, normal work of breathing.  No wheezes, rales, rhonchi Heart- Regular rate and rhythm, no murmurs, rubs or gallops, PMI not laterally displaced GI- soft, non-tender, non-distended, bowel sounds present, no hepatosplenomegaly Extremities- no clubbing, cyanosis, or edema; DP/PT/radial pulses 2+ bilaterally MS- no significant deformity or atrophy Skin- warm and dry, no rash or lesion Psych- euthymic mood, full affect Neuro- strength and sensation are intact  EKG is not ordered.   Additional studies reviewed include: TTE  07/13/19 Review of the above records today demonstrates:  Wall thickness is normal. Systolic  function is low normal. EF: 50-55%. Wall motion is within normal limits.  No apical thrombus Doppler parameters consistent with restrictive filling  pattern and markedly elevated LA pressure. Mitral valve E/A ratio 2.4. Mitral valve structure is normal. There is mild to moderate mitral  regurgitation.  Cardiac monitor 02/17/2020 personally reviewed Max 130 bpm 09:07pm, 09/15 Min 51 bpm 04:06am, 09/17 Avg 64 bpm Less than 1% PACs 8.7% PVCs Predominant rhythm was sinus rhythm Symptoms associated with sinus rhythm and artifact   Assessment and Plan:  1.  PVCs:  Improved recently by monitor on mexiletine.  No changes today.   2.  Chronic diastolic heart failure:  She is down 12 lbs on lasix BID.  Decrease lasix to 40 mg daily with extra  as needed for 3 lb weight gain over night or 5 lbs within one week. We discussed that she will likely need this extra dose on Friday and Monday Take potassium 10 meq daily; Take an extra 10 onn days she takes extra lasix.  Discussed fluid restriction. She is drinking between 3-4 L of fluid a day. Encouraged her to cut back gradually to a goal of around 2 L a day (don't want to overshoot and cause dehydration)    RTC 2 month for further assessment of volume status and sliding scale diuretics. Labs today.   Shirley Friar, PA-C  09/04/20 10:54 AM

## 2020-09-13 ENCOUNTER — Ambulatory Visit: Payer: Medicare HMO | Admitting: Physician Assistant

## 2020-09-26 ENCOUNTER — Telehealth: Payer: Self-pay | Admitting: Podiatry

## 2020-09-26 ENCOUNTER — Other Ambulatory Visit: Payer: Self-pay | Admitting: Podiatry

## 2020-09-26 ENCOUNTER — Other Ambulatory Visit: Payer: Self-pay

## 2020-09-26 ENCOUNTER — Encounter: Payer: Self-pay | Admitting: Podiatry

## 2020-09-26 ENCOUNTER — Ambulatory Visit: Payer: Medicare HMO | Admitting: Podiatry

## 2020-09-26 DIAGNOSIS — L600 Ingrowing nail: Secondary | ICD-10-CM

## 2020-09-26 DIAGNOSIS — M79672 Pain in left foot: Secondary | ICD-10-CM

## 2020-09-26 MED ORDER — HYDROCODONE-ACETAMINOPHEN 10-325 MG PO TABS: 1.0000 | ORAL_TABLET | Freq: Three times a day (TID) | ORAL | 0 refills | Status: DC | PRN

## 2020-09-26 NOTE — Telephone Encounter (Signed)
Patient is requesting pain medication after having toenail removed today. Please advise and send to pharmacy on file.

## 2020-09-26 NOTE — Progress Notes (Signed)
Subjective:   Patient ID: [name], female   DOB: 75 y.o.   MRN: [id number]   HPI Patient states she developed a thick damaged painful second nail right that is impossible to cut and they cannot take care of in her left foot structure is still bothersome for her with ambulation with lesion formation   ROS      Objective:  Physical Exam  Neurovascular status intact bilateral good digital perfusion noted second nail bed right very thickened deformed and on the left foot there is severe cavus foot structure with chronic lesion formation which is improved but still present     Assessment:  Damaged second nail bed right with thickness along with damaged structure of the left foot     Plan:  H&P reviewed all conditions recommended nail removal right explained procedure risk and patient wants surgery and today I infiltrated 60 mg like Marcaine mixture sterile prep done and using sterile instrumentation remove the second nail exposed matrix applied phenol 3 applications 30 seconds followed by alcohol lavage sterile dressing and for the left I went ahead today and I discussed her foot structure different types of shoes which could be of benefit for her cushioning and patient will be seen back to recheck for chronic treatment of the lesion formations

## 2020-09-26 NOTE — Patient Instructions (Signed)

## 2020-09-27 ENCOUNTER — Telehealth: Payer: Self-pay | Admitting: Podiatry

## 2020-09-27 ENCOUNTER — Other Ambulatory Visit: Payer: Self-pay | Admitting: Podiatry

## 2020-09-27 MED ORDER — HYDROCODONE-ACETAMINOPHEN 10-325 MG PO TABS: 1.0000 | ORAL_TABLET | Freq: Three times a day (TID) | ORAL | 0 refills | Status: AC | PRN

## 2020-09-27 NOTE — Telephone Encounter (Signed)
Right, but I called the pharmacy myself and they dont have it.

## 2020-09-27 NOTE — Telephone Encounter (Signed)
Patient called inquiring about prescription, stated pharmacy telling her they haven't received the order to fill Please Advise

## 2020-09-27 NOTE — Telephone Encounter (Signed)
[facility]

## 2020-09-27 NOTE — Telephone Encounter (Signed)
Patient states the pharmacy does not have her pain medication, or the order. I see the prescription was sent in yesterday ([facility]). Please advise.

## 2020-09-27 NOTE — Telephone Encounter (Signed)
She should go there to get it

## 2020-10-01 ENCOUNTER — Telehealth: Payer: Self-pay | Admitting: Podiatry

## 2020-10-01 ENCOUNTER — Other Ambulatory Visit: Payer: Self-pay | Admitting: Podiatry

## 2020-10-01 MED ORDER — DOXYCYCLINE HYCLATE 100 MG PO TABS: 100.0000 mg | ORAL_TABLET | Freq: Two times a day (BID) | ORAL | 1 refills | Status: DC

## 2020-10-01 NOTE — Telephone Encounter (Signed)
Patient calling to request a call back in regards to blistering and redness after ingrown removal

## 2020-10-01 NOTE — Telephone Encounter (Signed)
Patient is wondering if she can get an antibiotic sent in for her foot. The blisters have burst and her toe is getting dark. She does not want to loose her toe.

## 2020-10-01 NOTE — Telephone Encounter (Signed)
Please call and check on her. She can go to soapy water instead of epson salts for soak

## 2020-10-01 NOTE — Telephone Encounter (Signed)
Called patient to let her know that Dr. Paulla Dolly sent in an antibiotic and that she should switch over to the soapy water soaks. I did let her know if she felt like the toe was not getting better after a few days then she would need to call the office and schedule an appointment to come in and see Dr. Paulla Dolly. She stated that she understood.

## 2020-10-01 NOTE — Telephone Encounter (Signed)
Patient had an ingrown done by Dr. Paulla Dolly on 09/26/2020 and the patient stated that its has some redness around the toe and there are some blisters. Please call patient.

## 2020-10-10 ENCOUNTER — Ambulatory Visit: Payer: Medicare HMO | Admitting: Podiatry

## 2020-10-10 ENCOUNTER — Other Ambulatory Visit: Payer: Self-pay

## 2020-10-10 DIAGNOSIS — L03031 Cellulitis of right toe: Secondary | ICD-10-CM

## 2020-10-10 MED ORDER — HYDROCODONE-ACETAMINOPHEN 10-325 MG PO TABS: 1.0000 | ORAL_TABLET | Freq: Three times a day (TID) | ORAL | 0 refills | Status: AC | PRN

## 2020-10-10 MED ORDER — DOXYCYCLINE HYCLATE 100 MG PO TABS: 100.0000 mg | ORAL_TABLET | Freq: Two times a day (BID) | ORAL | 1 refills | Status: DC

## 2020-10-10 NOTE — Progress Notes (Signed)
Subjective:   Patient ID: [name], female   DOB: 74 y.o.   MRN: [id number]   HPI Patient presents stating my second toe right foot has been discolored and I had some blistering with it and I am just worried and wanted it checked   ROS      Objective:  Physical Exam  Neurovascular status is found to be intact and robust with patient second digit right showing blistering and some bleeding within the actual toe itself where the nail was removed is healing well     Assessment:  Traumatized right second toe difficult to say if it was a reaction or paronychia type infection localized no proximal edema erythema or drainage noted     Plan:  H&P cleaned area up discussed continued soaks applied sterile dressing Silvadene with compression and also placed back on an antibiotic precautionary for the next 10 days.  I do think all this tissue will eventually slough off and will be clean healthy tissue underneath but I do think that she needs to be careful with this and if any redness drainage or proximal signs of infection were to occur she is to let us know immediately

## 2020-10-19 ENCOUNTER — Telehealth: Payer: Self-pay | Admitting: *Deleted

## 2020-10-19 NOTE — Telephone Encounter (Signed)
Scheduled the patient for a follow up appt with Dr Denman George on 6/3

## 2020-10-24 ENCOUNTER — Encounter: Payer: Self-pay | Admitting: Gynecologic Oncology

## 2020-10-26 ENCOUNTER — Other Ambulatory Visit: Payer: Self-pay

## 2020-10-26 ENCOUNTER — Encounter: Payer: Self-pay | Admitting: Gynecologic Oncology

## 2020-10-26 ENCOUNTER — Inpatient Hospital Stay: Payer: Medicare HMO | Attending: Gynecologic Oncology | Admitting: Gynecologic Oncology

## 2020-10-26 VITALS — BP 150/59 | HR 64 | Temp 97.0°F | Resp 18 | Ht 65.0 in | Wt 254.6 lb

## 2020-10-26 DIAGNOSIS — N762 Acute vulvitis: Secondary | ICD-10-CM | POA: Insufficient documentation

## 2020-10-26 DIAGNOSIS — Z9079 Acquired absence of other genital organ(s): Secondary | ICD-10-CM | POA: Insufficient documentation

## 2020-10-26 DIAGNOSIS — Z8544 Personal history of malignant neoplasm of other female genital organs: Secondary | ICD-10-CM | POA: Diagnosis not present

## 2020-10-26 DIAGNOSIS — Z87891 Personal history of nicotine dependence: Secondary | ICD-10-CM | POA: Diagnosis not present

## 2020-10-26 DIAGNOSIS — C4499 Other specified malignant neoplasm of skin, unspecified: Secondary | ICD-10-CM

## 2020-10-26 NOTE — Patient Instructions (Signed)
Dr Denman George recommends returning to see her in 3 months.

## 2020-10-26 NOTE — Progress Notes (Signed)
Follow-up Note: Gyn-Onc   [name] 75 y.o. female  Chief Complaint  Patient presents with  . Extramammary Paget's disease    Assessment : Long-standing history of Paget's disease of the vulva. No gross disease on today's exam.  Plan:    Follow-up in 3 months for vulvar inspection.   We will refill her nystatin by phone if she calls needing a refill.   HPI:  Patient is a 75 year old with a history of a partial simple vulvectomy in February of 2012 for vulvar Paget's disease. Some of the surgical margins were positive. Her postoperative course was uncomplicated. She was last seen by our service in 12/14. At that time there was no evidence of any lesions consistent with Paget's. However, there is considerable amount of excoriations throughout the entire vulvar and medial thighs consistent with chronic vulvitis.In December 2013 she had a hernia repair due to bowel obstruction by Dr. Excell Seltzer with permanent mesh. In May 2013 she also had a TVT bladder by Dr. Everett Graff.   She underwent a wide local excision of the left vulva on January 2 of year 2014. Operative findings included 2 separate 3 x 3 and 1.2 cm lesions consistent with Paget's on the left.  Pathology revealed 2 foci of extramammary Paget's disease extending to the left inked margin in the right and anterior tip margins at multiple foci. The posterior margin was negative for malignancy.   On 11/23/2012 showed a repeat wide local excision of vulva x2 with the posterior fourchette biopsy. Findings revealed along the left labia minora/vagina is a Paget's disease measuring approximately 2.5 x 1 cm in the superior aspect of her prior left vulvar excision. The inferior aspect of a similar lesion. Biopsy and excision was performed in this area showing extramammary paget disease in all sites.  She had been seen in January of 2015. At that time exam was concerning for Paget's.   On 08/02/2013 she underwent wide local excisions  of the vulva x2. Operative findings included pale pink raised lesion measuring 1 x 0.5 cm the left upper vulva near the prior excision site. There is a pale raised lesion measuring 1.5 x 1 cm on the left crossing midline perineal body. Pathology revealed left superior vulvar excision with extramammary Paget's disease extending to the 9:00 margin. The other margins were negative for tumor. Of the left of midline lesion she extramammary Paget's disease extending to the 12:00 margin and focally at the 6:00 margin but otherwise negative margins.   She was seen on March 16, 2014 at which time there was an area on the left vulva with hyperkeratosis. A biopsy of that area was performed consistent with Paget's disease. On March 29, 2014 she underwent wide local excision of the vulva. On exam she had a 1 cm left vulvar lesion just below the left labia minora. She underwent a wide local excision. He received a 3.7 x 1.7 x 0.3 cm lesion. There was extramammary Paget's disease extending to the 6 to 9:00 margin in the 12:00 margin. This is despite grossly negative margins on physical examination.   She had an excision in November, 2015 that revealed extramammary pagets disease, extending to 6-9 o'clock and 12 o'clock margins.   She was seen in February 2016 at which time there was concern for recurrent disease. She decided to proceed with Aldara therapy. She used for approximate 4 weeks and then decided to stop it secondary to the expense of the medication. She then started using the clobetasol.  While she was using the Aldara all the pruritus in her vulva resolved itself. Since she stopped it and was using the steroid cream the pruritus returned.  She was seen in May 2016 at which time she has small area consistent with Paget's on the right labia minora and a larger patch on the left. Biopsy on the right revealed extramammary Paget's. After discussion she wished to try the Aldara again.   She was seen December 27, 2014 at which time there was evidence clearly a Paget's disease and she scheduled for surgery. On January 24, 2015 she underwent bilateral wide local excisions of the vulva. On the right vulva there was a 2 cm patch of Paget's disease in the mid labia majora. On the left side. The remaining labia minora in the area prior to the excision are smaller lesion consistent with Paget's disease. Pathology confirmed pagets with positive margins.  The patient was taken to the operating room on 01/31/2016 for preoperative mapping of her lesion for her planned wide local excision of the left vulva and smaller excision on the right. Operative findings at that time were notable for surgically absent inferior aspect of the left labium minora and majora. Multiple areas with white flaky skin changes were biopsied and the abnormal-appearing tissue. Changes noted in the area of the clitoris and perineum and at the introitus just distal to the hymen. Biopsies were collected on all of the sites. Pagets disease was confirmed with positive margins.   She was evaluated in September, 2020 and biopsy of the vulva confirmed recurrent Pagets disease. She underwent bilateral simple partial vulvectomy on 03/04/19 with plastic surgery reconstruction (gracilis flap). Pathology confirmed recurrent pagets with positive margins bilaterally.   In June, 2021 she had a vulvar biopsy positive for pagets which was treated with Imiquimod by Dr Alycia Rossetti at [facility]  Interval History:  She returned to see me today for establishing follow-up as Dr Alycia Rossetti is no longer with the practice. The patient has symptoms of candida but no symptoms of pagets irritation.    Allergies  Allergen Reactions  . Tape     "thick clear plastic tape" causes blisters   . Hydromorphone Hcl Itching and Rash  . Macrobid [Nitrofurantoin] Rash  . Morphine And Related Itching and Rash  . Penicillins Hives and Swelling    1968  . Valisone [Betamethasone] Rash     Past Medical History:  Diagnosis Date  . ACS (acute coronary syndrome) ([place]) 04/11/2017  . ALLERGIC RHINITIS 08/23/2007   Qualifier: Diagnosis of  By: Doy Mince LPN, Megan    . Anxiety   . Arthritis   . Asthma   . ASTHMA 08/23/2007   Qualifier: Diagnosis of  By: Doy Mince LPN, Megan    . Bigeminy 04/10/2017  . 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 Cancer of right breast, stage 2 ([place]) 05/28/2011   2.8 cm invasive 1 node pos ER pos S/P mastectomy 06/1995 then AC x 4 then Tam X 5   . Chest pain, rule out acute myocardial infarction 04/10/2017  . Chronic constipation   . Chronic cystitis 04/09/2017  . Cough 08/23/2007   Qualifier: Diagnosis of  By: Gwenette Greet MD, Armando Reichert   . Depression   . 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 Dysrhythmia 2020   Had Afib  . 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 Fibromyalgia   . GERD (gastroesophageal reflux  disease)   . History of adenomatous polyp of colon 09/30/2018  . 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 History of colon polyps    2007 hyperplagia  . History of esophageal dilatation    2008  . History of peptic ulcer   . History of small bowel obstruction    2012  . Hypothyroid 05/28/2011  . Hypothyroidism   . Incomplete emptying of bladder 07/30/2016  . Left foot pain 12/11/2015   Overview:  Dr Cyril Mourning [facility]  . Macular degeneration of right eye   . Mild depression ([place]) 05/28/2011  . Mixed incontinence urge and stress 09/16/2011   S/p lumax desires surgery   . Morbid obesity ([name]) 04/10/2011  . Numbness of left foot    4 toes  . Osteoporosis 12/15/2016   Overview:  Dr Cletis Media  . 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[place])   . Personal history of chemotherapy 1997  . Personal history of radiation  therapy 2005  . PONV (postoperative nausea and vomiting)    and HARD TO WAKE  . Seasonal allergies   . Shortness of breath   . SKIN CANCER, HX OF 08/23/2007   Qualifier: Diagnosis of  By: Doy Mince LPN, Megan    . SUI (stress urinary incontinence, female)   . Thrombocytopenic disorder ([place]) 02/25/2017  . Wears glasses     Past Surgical History:  Procedure Laterality Date  . BLADDER SUSPENSION  10/13/2011   Procedure: TRANSVAGINAL TAPE (TVT) PROCEDURE;  Surgeon: Delice Lesch, MD;  Location: [facility];  Service: Gynecology;  Laterality: N/A;  . BREAST BIOPSY Left 03/19/2004  . BREAST LUMPECTOMY Left 03-27-2004  . CARDIAC CATHETERIZATION  10-30-2000  dr Wynonia Lawman   normal coronary arteries and LVF/  ef 65-70%  . CLOSED MANIPULATION  W/ OPEN REDUCTION EXCHANGE TIBIAL FEMORAL BEARING POST TOTAL LEFT KNEE  04-14-2001  . CYSTOSCOPY  10/13/2011   Procedure: CYSTOSCOPY;  Surgeon: Delice Lesch, MD;  Location: [facility];  Service: Gynecology;  Laterality: Bilateral;  . DIAGNOSTIC LAPAROSCOPY     Colon d/t SBO  . DILATION AND CURETTAGE OF UTERUS  1968  . FOOT SURGERY Left 02-01-2009   TRIPLE ARTHRODESIS  . FOOT SURGERY Left 11/14   I&D left foot with woundvac placement  . INCISIONAL HERNIA REPAIR  05/05/2011   Procedure: LAPAROSCOPIC INCISIONAL HERNIA;  Surgeon: Edward Jolly, MD;  Location: [facility];  Service: General;  Laterality: N/A;  LAPAROSCOPIC REPAIR INCARCERATED INCISIONAL HERNIA  WITH MESH  . KNEE ARTHROSCOPY Bilateral left [id number] & 04-1992/   right [phone]  . LAPAROSCOPIC CHOLECYSTECTOMY  06-21-2010  . LEFT HEART CATH AND CORONARY ANGIOGRAPHY N/A 04/13/2017   Procedure: LEFT HEART CATH AND CORONARY ANGIOGRAPHY;  Surgeon: Jettie Booze, MD;  Location: [facility];  Service: Cardiovascular;  Laterality: N/A;  . LUMBAR SPINE SURGERY  1998   L5 -- S1  . MASTECTOMY Right 1997   W/  LYMPH NODE DISSECTION AND RECONSTRUCTION WITH IMPLANTS AND EXPANDER  . NEGATIVE SLEEP  STUDY  12/26/2015  . PORT-A-CATH PLACEMENT AND REMOVAL  1997  . REMOVAL RIGHT BREAST IMPLANT AND CAPSULECTOMY  06-03-1999  . REVERSE SHOULDER ARTHROPLASTY Left 10/25/2019   Procedure: LEFT REVERSE SHOULDER ARTHROPLASTY;  Surgeon: Meredith Pel, MD;  Location: [facility];  Service: Orthopedics;  Laterality: Left;  . REVISION TOTAL KNEE  ARTHROPLASTY Left 07-07-2005  &  12-10-2001   12-10-2001  REMOVAL TOTAL KNEE/ I&D / INSERTION ANTIBIOTIC SPACER  . REVISION TOTAL KNEE ARTHROPLASTY Right 07-07-2005  . ROTATOR CUFF REPAIR Right   . TENDON REPAIR RIGHT RING FINGER  2011  . THUMB TRIGGER RELEASE Right 1993  . THYROIDECTOMY, PARTIAL  1983  . TONSILLECTOMY  1968  . TOTAL KNEE ARTHROPLASTY Bilateral right 05-04-2000/  left 03-29-2001  . VULVECTOMY  05/27/2012   Procedure: WIDE EXCISION VULVECTOMY;  Surgeon: Imagene Gurney A. Alycia Rossetti, MD;  Location: [facility];  Service: Gynecology;  Laterality: N/A;  Wide Local Excision of Vulva   . VULVECTOMY N/A 11/23/2012   Procedure: WIDE LOCAL EXCISION OF THE VULVA;  Surgeon: Imagene Gurney A. Alycia Rossetti, MD;  Location: [facility];  Service: Gynecology;  Laterality: N/A;  left inferior vulvar biopsy excision left superior lesion left inferior vulvar excision  . VULVECTOMY N/A 08/02/2013   Procedure: WIDE LOCAL  EXCISION VULVA;  Surgeon: Imagene Gurney A. Alycia Rossetti, MD;  Location: [facility];  Service: Gynecology;  Laterality: N/A;  . VULVECTOMY Left 03/29/2014   Procedure: WIDE LOCAL EXCISION OF LEFT VULVA ;  Surgeon: Imagene Gurney A. Alycia Rossetti, MD;  Location: [facility];  Service: Gynecology;  Laterality: Left;  Marland Kitchen VULVECTOMY Bilateral 01/24/2015   Procedure: WIDE LOCAL EXCISION VULVA;  Surgeon: [name], MD;  Location: [facility];  Service: Gynecology;  Laterality: Bilateral;  . VULVECTOMY PARTIAL  07-16-2010    Current Outpatient Medications  Medication Sig Dispense Refill  . acetaminophen (TYLENOL) 500 MG tablet Take 1,000 mg by mouth every 6 (six) hours as needed for mild pain or  moderate pain.    Marland Kitchen aspirin EC 81 MG EC tablet Take 1 tablet (81 mg total) daily by mouth. 30 tablet 0  . atorvastatin (LIPITOR) 40 MG tablet Take 1 tablet (40 mg total) daily at 6 PM by mouth. 30 tablet 0  . bisoprolol (ZEBETA) 5 MG tablet TAKE 1 TABLET BY MOUTH EVERY DAY 90 tablet 3  . buPROPion (WELLBUTRIN XL) 150 MG 24 hr tablet Take 150 mg by mouth every morning.    . celecoxib (CELEBREX) 200 MG capsule Take 1 capsule (200 mg total) by mouth daily. 30 capsule 0  . Cholecalciferol (VITAMIN D3) 1000 UNITS CAPS Take 2,000 Units by mouth daily.     . citalopram (CELEXA) 40 MG tablet Take 1 tablet by mouth daily.    . furosemide (LASIX) 40 MG tablet Take 1 tablet (40 mg total) by mouth daily. Take extra as needed for weight gain. 90 tablet 3  . hydrOXYzine (VISTARIL) 25 MG capsule Take 25 mg by mouth 3 (three) times daily as needed for itching.    . ibandronate (BONIVA) 150 MG tablet Take 150 mg by mouth every 30 (thirty) days. Take in the morning with a full glass of water, on an empty stomach, and do not take anything else by mouth or lie down for the next 30 min.    Marland Kitchen ketoconazole (NIZORAL) 2 % shampoo Apply 1 application topically every 14 (fourteen) days.    Marland Kitchen levothyroxine (SYNTHROID) 200 MCG tablet Take 200 mcg by mouth daily before breakfast.     . methocarbamol (ROBAXIN) 500 MG tablet TAKE 1 TABLET BY MOUTH EVERY 8 HOURS AS NEEDED 30 tablet 0  . montelukast (SINGULAIR) 10 MG tablet Take 10 mg by mouth daily.    . Multiple Vitamin (MULTIVITAMIN WITH MINERALS) TABS tablet Take 1 tablet daily by mouth.    . nystatin (MYCOSTATIN/NYSTOP)  powder Apply topically 4 (four) times daily. 60 g 6  . nystatin cream (MYCOSTATIN) Apply 1 application topically 2 (two) times daily. 30 g 6  . pantoprazole (PROTONIX) 40 MG tablet Take 40 mg by mouth daily.    . polyvinyl alcohol (LIQUIFILM TEARS) 1.4 % ophthalmic solution Place 1 drop into both eyes daily as needed for dry eyes.     . potassium chloride  (KLOR-CON) 10 MEQ tablet Take 1 tablet (10 mEq total) by mouth daily. Take extra tablet when you take extra Lasix 90 tablet 3  . albuterol (PROVENTIL HFA;VENTOLIN HFA) 108 (90 BASE) MCG/ACT inhaler Inhale 2 puffs into the lungs every 4 (four) hours as needed for wheezing or shortness of breath. Reported on 08/01/2015 (Patient not taking: Reported on 10/24/2020)    . budesonide-formoterol (SYMBICORT) 160-4.5 MCG/ACT inhaler Inhale 2 puffs into the lungs daily as needed (Wheezing in the winter).  (Patient not taking: Reported on 10/24/2020)    . clindamycin (CLEOCIN) 150 MG capsule Take 150 mg by mouth 3 (three) times daily. Only foe dental visits (Patient not taking: Reported on 10/24/2020)    . doxycycline (VIBRA-TABS) 100 MG tablet Take 1 tablet (100 mg total) by mouth 2 (two) times daily. 20 tablet 1  . doxycycline (VIBRA-TABS) 100 MG tablet Take 1 tablet (100 mg total) by mouth 2 (two) times daily. 20 tablet 1  . ipratropium-albuterol (DUONEB) 0.5-2.5 (3) MG/3ML SOLN Take 3 mLs by nebulization every 4 (four) hours as needed (Wheezing).  (Patient not taking: Reported on 10/24/2020)    . mexiletine (MEXITIL) 150 MG capsule TAKE 1 CAPSULE (150 MG TOTAL) BY MOUTH 3 (THREE) TIMES DAILY. (Patient not taking: Reported on 10/24/2020) 90 capsule 2   No current facility-administered medications for this visit.    Social History   Socioeconomic History  . Marital status: Married    Spouse name: Not on file  . Number of children: Not on file  . Years of education: Not on file  . Highest education level: Not on file  Occupational History  . Not on file  Tobacco Use  . Smoking status: Former Smoker    Packs/day: 2.00    Years: 41.00    Pack years: 82.00    Types: Cigarettes    Quit date: 06/05/1999    Years since quitting: 21.4  . Smokeless tobacco: Never Used  Vaping Use  . Vaping Use: Never used  Substance and Sexual Activity  . Alcohol use: No  . Drug use: No  . Sexual activity: Not on file  Other  Topics Concern  . Not on file  Social History Narrative  . Not on file   Social Determinants of Health   Financial Resource Strain: Not on file  Food Insecurity: Not on file  Transportation Needs: Not on file  Physical Activity: Not on file  Stress: Not on file  Social Connections: Not on file  Intimate Partner Violence: Not on file    Family History  Problem Relation Age of Onset  . Kidney cancer Father 64       metastatic   . Alzheimer's disease Mother   . Other Mother        ? Cancer  . Heart attack Maternal Grandmother   . Alcohol abuse Brother   . Bladder Cancer Brother        dx 55s  . Breast cancer Paternal Aunt        dx after 37  . Bladder Cancer Paternal Aunt  dx after 50  . Breast cancer Cousin        two paternal female cousins; unknown age of diagnosis  . Cancer Maternal Aunt        ? gallbladder; dx 4s  . Cancer Maternal Uncle        unknown type; ? pancreatic ? colon; dx after 21  . Brain cancer Paternal Uncle        unknown age of dx  . Melanoma Brother        dx late 40s-early 60s  . Bone cancer Maternal Uncle        dx after 50  . Leukemia Maternal Uncle        dx after 34  . Bone cancer Maternal Uncle        dx after 50  . Lung cancer Maternal Uncle        dx after 50  . Brain cancer Cousin        two maternal female cousins; dx after 26  . Stomach cancer Paternal Aunt        unknown age of dx   Vitals: Blood pressure (!) 137/59, pulse 91, temperature 98.5 F (36.9 C), temperature source Oral, resp. rate 18, height 5\' 5"  (1.651 m), weight 242 lb (109.8 kg), SpO2 97 %.  Physical Exam: General : The patient is a healthy woman in no acute distress.   Groins: No lymphadenopathy. Candidal changes present.   Vulva: There is a patch of erythematous skin change on the right labia minora which is stable in appearance. There is perianal erythema, also stable in appearance. No grossly invasive disease.      Thereasa Solo, MD 10/26/2020,  4:03 PM

## 2020-11-12 ENCOUNTER — Ambulatory Visit: Payer: Medicare HMO | Admitting: Student

## 2020-11-15 ENCOUNTER — Ambulatory Visit: Payer: Medicare HMO | Admitting: Student

## 2020-11-17 ENCOUNTER — Other Ambulatory Visit: Payer: Self-pay | Admitting: Cardiovascular Disease

## 2020-11-19 NOTE — Telephone Encounter (Signed)
Rx(s) sent to pharmacy electronically.  

## 2020-11-27 NOTE — Progress Notes (Signed)
PCP:  Chesley Noon, MD Primary Cardiologist: None Electrophysiologist: Will Meredith Leeds, MD   [name] is a 75 y.o. female seen today for Will Meredith Leeds, MD for routine electrophysiology followup.  Since last being seen in our clinic the patient reports doing OK. Continues to have edema. Takes lasix BID several times a week for edema. Notices frequent urination on days she takes extra. She denies chest pain, palpitations, dyspnea, PND, orthopnea, nausea, vomiting, dizziness, syncope, weight gain, or early satiety.  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   Cancer of right breast, stage 2 ([place]) 05/28/2011   2.8 cm invasive 1 node pos ER pos S/P mastectomy 06/1995 then AC x 4 then Tam X 5    Chest pain, rule out acute myocardial infarction 04/10/2017   Chronic constipation    Chronic cystitis 04/09/2017   Cough 08/23/2007   Qualifier: Diagnosis of  By: Gwenette Greet MD, Armando Reichert    Depression    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   Dysrhythmia 2020   Had Afib   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   Fibromyalgia    GERD (gastroesophageal reflux disease)    History of adenomatous polyp of colon 09/30/2018   History of breast cancer no recurrence   1997  right breast cancer  s/p  mastectomy w/ snl dissection and chemoradiation/  2005  left breast cancer DCIS  s/p  lumpectomy and radiation   History of colon polyps    2007 hyperplagia   History of esophageal dilatation    2008   History of peptic ulcer    History of small bowel obstruction    2012   Hypothyroid 05/28/2011    Hypothyroidism    Incomplete emptying of bladder 07/30/2016   Left foot pain 12/11/2015   Overview:  Dr Cyril Mourning [facility]   Macular degeneration of right eye    Mild depression ([place]) 05/28/2011   Mixed incontinence urge and stress 09/16/2011   S/p lumax desires surgery    Morbid obesity ([place]) 04/10/2011   Numbness of left foot    4 toes   Osteoporosis 12/15/2016   Overview:  Dr Cletis Media   Paget's disease of vulva [facility])    Personal history of chemotherapy 1997   Personal history of radiation therapy 2005   PONV (postoperative nausea and vomiting)    and HARD TO WAKE   Seasonal allergies    Shortness of breath    SKIN CANCER, HX OF 08/23/2007   Qualifier: Diagnosis of  By: Doy Mince LPN, Megan     SUI (stress urinary incontinence, female)    Thrombocytopenic disorder ([place]) 02/25/2017   Wears glasses    Past Surgical History:  Procedure Laterality Date   BLADDER SUSPENSION  10/13/2011   Procedure: TRANSVAGINAL TAPE (TVT) PROCEDURE;  Surgeon: Delice Lesch, MD;  Location: [facility];  Service: Gynecology;  Laterality: N/A;   BREAST BIOPSY Left 03/19/2004   BREAST LUMPECTOMY Left 03-27-2004   CARDIAC CATHETERIZATION  10-30-2000  dr Wynonia Lawman   normal coronary arteries and LVF/  ef 65-70%   CLOSED MANIPULATION  W/ OPEN REDUCTION EXCHANGE TIBIAL FEMORAL BEARING POST TOTAL LEFT KNEE  04-14-2001   CYSTOSCOPY  10/13/2011   Procedure: CYSTOSCOPY;  Surgeon: Delice Lesch, MD;  Location: [facility];  Service: Gynecology;  Laterality: Bilateral;   DIAGNOSTIC LAPAROSCOPY     Colon d/t SBO   DILATION AND CURETTAGE OF UTERUS  1968   FOOT SURGERY Left 02-01-2009   TRIPLE ARTHRODESIS   FOOT SURGERY Left 11/14   I&D left foot with woundvac placement   INCISIONAL HERNIA REPAIR  05/05/2011   Procedure: LAPAROSCOPIC INCISIONAL HERNIA;  Surgeon: Edward Jolly, MD;  Location: [facility];  Service: General;  Laterality: N/A;  LAPAROSCOPIC REPAIR INCARCERATED INCISIONAL HERNIA  WITH MESH   KNEE ARTHROSCOPY  Bilateral left 01-1984 & 04-1992/   right [id number]   LAPAROSCOPIC CHOLECYSTECTOMY  06-21-2010   LEFT HEART CATH AND CORONARY ANGIOGRAPHY N/A 04/13/2017   Procedure: LEFT HEART CATH AND CORONARY ANGIOGRAPHY;  Surgeon: Jettie Booze, MD;  Location: [facility];  Service: Cardiovascular;  Laterality: N/A;   LUMBAR SPINE SURGERY  1998   L5 -- S1   MASTECTOMY Right 1997   W/  LYMPH NODE DISSECTION AND RECONSTRUCTION WITH IMPLANTS AND EXPANDER   NEGATIVE SLEEP STUDY  12/26/2015   PORT-A-CATH PLACEMENT AND REMOVAL  1997   REMOVAL RIGHT BREAST IMPLANT AND CAPSULECTOMY  06-03-1999   REVERSE SHOULDER ARTHROPLASTY Left 10/25/2019   Procedure: LEFT REVERSE SHOULDER ARTHROPLASTY;  Surgeon: Meredith Pel, MD;  Location: [facility];  Service: Orthopedics;  Laterality: Left;   REVISION TOTAL KNEE ARTHROPLASTY Left 07-07-2005  &  12-10-2001   12-10-2001  REMOVAL TOTAL KNEE/ I&D / INSERTION ANTIBIOTIC SPACER   REVISION TOTAL KNEE ARTHROPLASTY Right 07-07-2005   ROTATOR CUFF REPAIR Right    TENDON REPAIR RIGHT RING FINGER  2011   THUMB TRIGGER RELEASE Right 1993   THYROIDECTOMY, PARTIAL  1983   TONSILLECTOMY  1968   TOTAL KNEE ARTHROPLASTY Bilateral right 05-04-2000/  left 03-29-2001   VULVECTOMY  05/27/2012   Procedure: WIDE EXCISION VULVECTOMY;  Surgeon: Imagene Gurney A. Alycia Rossetti, MD;  Location: [facility];  Service: Gynecology;  Laterality: N/A;  Wide Local Excision of Vulva    VULVECTOMY N/A 11/23/2012   Procedure: WIDE LOCAL EXCISION OF THE VULVA;  Surgeon: Imagene Gurney A. Alycia Rossetti, MD;  Location: [facility];  Service: Gynecology;  Laterality: N/A;  left inferior vulvar biopsy excision left superior lesion left inferior vulvar excision   VULVECTOMY N/A 08/02/2013   Procedure: WIDE LOCAL  EXCISION VULVA;  Surgeon: Imagene Gurney A. Alycia Rossetti, MD;  Location: [facility];  Service: Gynecology;  Laterality: N/A;   VULVECTOMY Left 03/29/2014   Procedure: WIDE LOCAL EXCISION OF LEFT VULVA ;  Surgeon: Imagene Gurney A. Alycia Rossetti, MD;  Location: [facility];  Service: Gynecology;  Laterality: Left;   VULVECTOMY Bilateral 01/24/2015   Procedure: WIDE LOCAL EXCISION VULVA;  Surgeon: [name], MD;  Location: [facility];  Service: Gynecology;  Laterality: Bilateral;   VULVECTOMY PARTIAL  07-16-2010    Current Outpatient Medications  Medication Sig Dispense Refill   acetaminophen (TYLENOL) 500 MG tablet Take 1,000 mg by mouth every 6 (six) hours as needed for mild pain or moderate pain.     albuterol (PROVENTIL HFA;VENTOLIN HFA) 108 (90 BASE) MCG/ACT inhaler Inhale 2 puffs into the lungs every 4 (four) hours as needed for wheezing or shortness of breath. Reported on 08/01/2015     aspirin EC 81 MG EC tablet  Take 1 tablet (81 mg total) daily by mouth. 30 tablet 0   atorvastatin (LIPITOR) 40 MG tablet Take 1 tablet (40 mg total) daily at 6 PM by mouth. 30 tablet 0   bisoprolol (ZEBETA) 5 MG tablet TAKE 1 TABLET BY MOUTH EVERY DAY 90 tablet 3   budesonide-formoterol (SYMBICORT) 160-4.5 MCG/ACT inhaler Inhale 2 puffs into the lungs daily as needed (Wheezing in the winter).     buPROPion (WELLBUTRIN XL) 150 MG 24 hr tablet Take 150 mg by mouth every morning.     celecoxib (CELEBREX) 200 MG capsule Take 1 capsule (200 mg total) by mouth daily. 30 capsule 0   Cholecalciferol (VITAMIN D3) 1000 UNITS CAPS Take 2,000 Units by mouth daily.      citalopram (CELEXA) 40 MG tablet Take 1 tablet by mouth daily.     clindamycin (CLEOCIN) 150 MG capsule Take 150 mg by mouth 3 (three) times daily. Only foe dental visits     hydrOXYzine (VISTARIL) 25 MG capsule Take 25 mg by mouth 3 (three) times daily as needed for itching.     ibandronate (BONIVA) 150 MG tablet Take 150 mg by mouth every 30 (thirty) days. Take in the morning with a full glass of water, on an empty stomach, and do not take anything else by mouth or lie down for the next 30 min.     ipratropium-albuterol (DUONEB) 0.5-2.5 (3) MG/3ML SOLN Take 3 mLs by nebulization every  4 (four) hours as needed (Wheezing).     ketoconazole (NIZORAL) 2 % shampoo Apply 1 application topically every 14 (fourteen) days.     levothyroxine (SYNTHROID) 200 MCG tablet Take 200 mcg by mouth daily before breakfast.      methocarbamol (ROBAXIN) 500 MG tablet TAKE 1 TABLET BY MOUTH EVERY 8 HOURS AS NEEDED 30 tablet 0   mexiletine (MEXITIL) 150 MG capsule TAKE 1 CAPSULE (150 MG TOTAL) BY MOUTH 3 (THREE) TIMES DAILY. 90 capsule 2   montelukast (SINGULAIR) 10 MG tablet Take 10 mg by mouth daily.     Multiple Vitamin (MULTIVITAMIN WITH MINERALS) TABS tablet Take 1 tablet daily by mouth.     nystatin (MYCOSTATIN/NYSTOP) powder Apply topically 4 (four) times daily. 60 g 6   nystatin cream (MYCOSTATIN) Apply 1 application topically 2 (two) times daily. 30 g 6   pantoprazole (PROTONIX) 40 MG tablet Take 40 mg by mouth daily.     polyvinyl alcohol (LIQUIFILM TEARS) 1.4 % ophthalmic solution Place 1 drop into both eyes daily as needed for dry eyes.      spironolactone (ALDACTONE) 25 MG tablet Take 1 tablet (25 mg total) by mouth daily. 90 tablet 3   furosemide (LASIX) 40 MG tablet Take 1.5 tablets (60 mg total) by mouth daily. Take extra 1/2 tablet as needed for weight gain. 150 tablet 3   No current facility-administered medications for this visit.    Allergies  Allergen Reactions   Tape     "thick clear plastic tape" causes blisters    Hydromorphone Hcl Itching and Rash   Macrobid [Nitrofurantoin] Rash   Morphine And Related Itching and Rash   Penicillins Hives and Swelling    1968   Valisone [Betamethasone] Rash    Social History   Socioeconomic History   Marital status: Married    Spouse name: Not on file   Number of children: Not on file   Years of education: Not on file   Highest education level: Not on file  Occupational History  Not on file  Tobacco Use   Smoking status: Former    Packs/day: 2.00    Years: 41.00    Pack years: 82.00    Types: Cigarettes    Quit  date: 06/05/1999    Years since quitting: 21.4   Smokeless tobacco: Never  Vaping Use   Vaping Use: Never used  Substance and Sexual Activity   Alcohol use: No   Drug use: No   Sexual activity: Not on file  Other Topics Concern   Not on file  Social History Narrative   Not on file   Social Determinants of Health   Financial Resource Strain: Not on file  Food Insecurity: Not on file  Transportation Needs: Not on file  Physical Activity: Not on file  Stress: Not on file  Social Connections: Not on file  Intimate Partner Violence: Not on file   Review of Systems: All other systems reviewed and are otherwise negative except as noted above.  Physical Exam: Vitals:   11/28/20 1101  BP: (!) 152/76  Pulse: 68  SpO2: 98%  Weight: 255 lb (115.7 kg)  Height: 5' 5.5" (1.664 m)   Wt Readings from Last 3 Encounters:  11/28/20 255 lb (115.7 kg)  10/26/20 254 lb 9.6 oz (115.5 kg)  09/04/20 254 lb (115.2 kg)    GEN- The patient is well appearing, alert and oriented x 3 today.   HEENT: normocephalic, atraumatic; sclera clear, conjunctiva pink; hearing intact; oropharynx clear; neck supple, no JVP Lymph- no cervical lymphadenopathy Lungs- Clear to ausculation bilaterally, normal work of breathing.  No wheezes, rales, rhonchi Heart- Regular rate and rhythm, no murmurs, rubs or gallops, PMI not laterally displaced GI- soft, non-tender, non-distended, bowel sounds present, no hepatosplenomegaly Extremities- no clubbing, cyanosis, or edema; DP/PT/radial pulses 2+ bilaterally MS- no significant deformity or atrophy Skin- warm and dry, no rash or lesion Psych- euthymic mood, full affect Neuro- strength and sensation are intact  EKG is not ordered.  Kardia applied in office showed no PVCs on a 30 second strip  Additional studies reviewed include: TTE  07/13/19 Review of the above records today demonstrates: Wall thickness is normal. Systolic  function is low normal. EF: 50-55%. Wall  motion is within normal limits.   No apical thrombus Doppler parameters consistent with restrictive filling  pattern and markedly elevated LA pressure.  Mitral valve E/A ratio 2.4. Mitral valve structure is normal. There is mild to moderate mitral  regurgitation.   Cardiac monitor 02/17/2020 personally reviewed Max 130 bpm 09:07pm, 09/15 Min 51 bpm 04:06am, 09/17 Avg 64 bpm Less than 1% PACs 8.7% PVCs Predominant rhythm was sinus rhythm Symptoms associated with sinus rhythm and artifact  Assessment and Plan:  1.  PVCs:  Was improved recently by monitor on mexiletine.  No changes today.     2.  Chronic diastolic heart failure:  Volume status looks OK on exam, taking extra lasix several times a week. NYHA II-III symptoms chronically Increase lasix to 60 mg daily.  Stop potassium with addition of spiro.  Add spironolactone 25 mg daily   BMET today and 10 days. RTC 3 month pending normal lab work and repeat. Sooner with issues.   3. HTN Adding spiro as above.  Follow as we adjust diuretics.   Shirley Friar, PA-C  11/28/20 11:32 AM

## 2020-11-28 ENCOUNTER — Ambulatory Visit (INDEPENDENT_AMBULATORY_CARE_PROVIDER_SITE_OTHER): Payer: Medicare HMO

## 2020-11-28 ENCOUNTER — Encounter: Payer: Self-pay | Admitting: Orthopedic Surgery

## 2020-11-28 ENCOUNTER — Other Ambulatory Visit: Payer: Self-pay

## 2020-11-28 ENCOUNTER — Ambulatory Visit: Payer: Medicare HMO | Admitting: Orthopedic Surgery

## 2020-11-28 ENCOUNTER — Ambulatory Visit: Payer: Medicare HMO | Admitting: Student

## 2020-11-28 ENCOUNTER — Encounter: Payer: Self-pay | Admitting: Student

## 2020-11-28 VITALS — BP 152/76 | HR 68 | Ht 65.5 in | Wt 255.0 lb

## 2020-11-28 DIAGNOSIS — M79672 Pain in left foot: Secondary | ICD-10-CM

## 2020-11-28 DIAGNOSIS — I5032 Chronic diastolic (congestive) heart failure: Secondary | ICD-10-CM

## 2020-11-28 DIAGNOSIS — I493 Ventricular premature depolarization: Secondary | ICD-10-CM

## 2020-11-28 LAB — BASIC METABOLIC PANEL
BUN/Creatinine Ratio: 36 — ABNORMAL HIGH (ref 12–28)
BUN: 21 mg/dL (ref 8–27)
CO2: 26 mmol/L (ref 20–29)
Calcium: 9.4 mg/dL (ref 8.7–10.3)
Chloride: 102 mmol/L (ref 96–106)
Creatinine, Ser: 0.58 mg/dL (ref 0.57–1.00)
Glucose: 112 mg/dL — ABNORMAL HIGH (ref 65–99)
Potassium: 4.8 mmol/L (ref 3.5–5.2)
Sodium: 141 mmol/L (ref 134–144)
eGFR: 95 mL/min/{1.73_m2} (ref >59)

## 2020-11-28 MED ORDER — FUROSEMIDE 40 MG PO TABS: 60.0000 mg | ORAL_TABLET | Freq: Every day | ORAL | 3 refills | Status: DC

## 2020-11-28 MED ORDER — HYDROCODONE-ACETAMINOPHEN 5-325 MG PO TABS: 1.0000 | ORAL_TABLET | Freq: Three times a day (TID) | ORAL | 0 refills | Status: DC | PRN

## 2020-11-28 MED ORDER — SPIRONOLACTONE 25 MG PO TABS: 25.0000 mg | ORAL_TABLET | Freq: Every day | ORAL | 3 refills | Status: DC

## 2020-11-28 NOTE — Patient Instructions (Signed)
Medication Instructions:  Your physician has recommended you make the following change in your medication:   STOP: Potassium START: Spironolactone 25mg  daily INCREASE: Furosemide to 60mg  daily and you may take an extra 1/2 tablet as needed  *If you need a refill on your cardiac medications before your next appointment, please call your pharmacy*   Lab Work: TODAY: BMET BMET in 10 days  If you have labs (blood work) drawn today and your tests are completely normal, you will receive your results only by: [place] (if you have MyChart) OR A paper copy in the mail If you have any lab test that is abnormal or we need to change your treatment, we will call you to review the results. Follow-Up: At [facility], you and your health needs are our priority.  As part of our continuing mission to provide you with exceptional heart care, we have created designated Provider Care Teams.  These Care Teams include your primary Cardiologist (physician) and Advanced Practice Providers (APPs -  Physician Assistants and Nurse Practitioners) who all work together to provide you with the care you need, when you need it.   Your next appointment:   3 month(s)  The format for your next appointment:   In Person  Provider:   Legrand Como "Oda Kilts, PA-C

## 2020-11-28 NOTE — Progress Notes (Signed)
Office Visit Note   Patient: [name]           Date of Birth: [birth year]           MRN: [id number] Visit Date: 11/28/2020 Requested by: Chesley Noon, MD [place],  [place] [address] PCP: Chesley Noon, MD  Subjective: Chief Complaint  Patient presents with   Other    Left foot/ankle pain    HPI: [name] is a 75 year old patient with left foot and ankle pain.  She has a history of left halo tibial calcaneal fusion done in 2014 by Dr. Clyde Canterbury in [place].  She had some wound complication problems and required revision wound surgery 2 months later.  She has done well since then but reports pain on the side of her foot which is the lateral side.  She has had prior surgery by Dr. Sharol Given on the fifth metatarsal head.  She has a wide shoe which she ambulates in.  She is reporting pain and callus formation on the foot.  She usually sees Dr. Paulla Dolly for callus management but she does not want to see him anymore.  Dr. Clyde Canterbury is no longer listed as a physician with [facility].  She has had multiple surgeries on the left knee for what sounds like a complicated primary knee replacement which required revision.  The knee has been relatively quiescent but mildly painful for years since its implantation she denies any fevers or chills              ROS: All systems reviewed are negative as they relate to the chief complaint within the history of present illness.  Patient denies  fevers or chills.   Assessment & Plan: Visit Diagnoses:  1. Left foot pain     Plan: Impression is left foot pain from abnormal gait and the patient has mild supination of the foot following tibia no calcaneal fusion.  She had prior surgery on the metatarsal #5 by Dr. Sharol Given.  She does not wish to return to see him.  In general I think it is going to have to be a very high bar for operative intervention to be worth the risk of wound problem distally and infection traveling upstream to the knee.   Nonetheless she has a complicated foot surgery history and I think she would be best served with another opinion which she would like to do.  I will send her to Dr. Wylene Simmer for his opinion on the appropriateness of any type of surgical intervention on the fifth metatarsal.  Any surgery I think may only transfer the loadbearing from 1 region of the foot to another.  We will see her back as needed.  Follow-Up Instructions: No follow-ups on file.   Orders:  Orders Placed This Encounter  Procedures   XR Ankle Complete Left   XR Foot Complete Left   Ambulatory referral to Orthopedic Surgery   Meds ordered this encounter  Medications   HYDROcodone-acetaminophen (NORCO/VICODIN) 5-325 MG tablet    Sig: Take 1 tablet by mouth every 8 (eight) hours as needed for moderate pain.    Dispense:  30 tablet    Refill:  0      Procedures: No procedures performed   Clinical Data: No additional findings.  Objective: Vital Signs: There were no vitals taken for this visit.  Physical Exam:   Constitutional: Patient appears well-developed HEENT:  Head: Normocephalic Eyes:EOM are normal Neck: Normal range of motion Cardiovascular: Normal  rate Pulmonary/chest: Effort normal Neurologic: Patient is alert Skin: Skin is warm Psychiatric: Patient has normal mood and affect   Ortho Exam: Ortho exam demonstrates well-healed surgical incisions around the foot.  The foot does have sensation.  Has callus formation over the fifth metatarsal head plantar.  Foot is in mild supination but is improved from its prior appearance.  Multiple knee incisions present and there is no effusion or warmth to the ankle or knee.  Specialty Comments:  No specialty comments available.  Imaging: XR Ankle Complete Left  Result Date: 11/28/2020 AP lateral oblique radiographs left ankle reviewed.  Tibiocalcaneal fusion rod is present with no obvious lucencies around the hardware or backing out of the hardware.  Good  fusion appears to have occurred.  Degenerative changes present in the midfoot region.  No acute fracture  XR Foot Complete Left  Result Date: 11/28/2020 AP lateral oblique radiographs left foot reviewed.  Prior surgery on the fifth metatarsal head has been performed.  No obvious osteomyelitis or tarsometatarsal malalignment.  Mild degenerative changes present within the inner phalangeal joints.    PMFS History: Patient Active Problem List   Diagnosis Date Noted   Personal history of breast cancer 07/16/2020   Family history of brain cancer 07/16/2020   Family history of kidney cancer 07/16/2020   Genetic testing 05/23/2020   S/P reverse total shoulder arthroplasty, left 10/25/2019   History of adenomatous polyp of colon 09/30/2018   Shortness of breath    ACS (acute coronary syndrome) ([place]) 04/11/2017   Chest pain, rule out acute myocardial infarction 04/10/2017   Bigeminy 04/10/2017   Chronic cystitis 04/09/2017   Thrombocytopenic disorder ([place]) 02/25/2017   Osteoporosis 12/15/2016   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   Incomplete emptying of bladder 07/30/2016   Left foot pain 12/11/2015   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   Stopped smoking with greater than 40 pack year history 10/23/2015   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   Mixed incontinence urge and stress 09/16/2011   Cancer of right breast, stage 2 ([place]) 05/28/2011   DCIS (ductal carcinoma in situ) of breast 05/28/2011   Hypothyroid 05/28/2011   GERD (gastroesophageal reflux disease) 05/28/2011   Fibromyalgia 05/28/2011   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   Mild depression ([place]) 05/28/2011   Thrombocytopenia, unspecified ([place]) 05/28/2011   Morbid obesity ([place]) 04/10/2011   GERD 11/09/2009   ALLERGIC RHINITIS 08/23/2007   ASTHMA 08/23/2007   COUGH 08/23/2007   SKIN CANCER, HX OF 08/23/2007   Past Medical History:  Diagnosis Date    ACS (acute coronary syndrome) ([place]) 04/11/2017   ALLERGIC RHINITIS 08/23/2007   Qualifier: Diagnosis of  By: Doy Mince LPN, Megan     Anxiety    Arthritis    Asthma    ASTHMA 08/23/2007   Qualifier: Diagnosis of  By: Doy Mince LPN, Megan     Bigeminy 04/10/2017   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   Cancer of right breast, stage 2 ([place]) 05/28/2011   2.8 cm invasive 1 node pos ER pos S/P mastectomy 06/1995 then AC x 4 then Tam X 5    Chest pain, rule out acute myocardial infarction 04/10/2017   Chronic constipation    Chronic cystitis 04/09/2017   Cough 08/23/2007   Qualifier: Diagnosis of  By: Gwenette Greet MD, Armando Reichert    Depression    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   Dysrhythmia 2020   Had Afib   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   Fibromyalgia    GERD (gastroesophageal reflux disease)    History of adenomatous polyp of colon 09/30/2018   History of breast cancer no recurrence   1997  right breast cancer  s/p  mastectomy w/ snl dissection and chemoradiation/  2005  left breast cancer DCIS  s/p  lumpectomy and radiation   History of colon polyps    2007 hyperplagia   History of esophageal dilatation    2008   History of peptic ulcer    History of small bowel obstruction    2012   Hypothyroid 05/28/2011   Hypothyroidism    Incomplete emptying of bladder 07/30/2016   Left foot pain 12/11/2015   Overview:  Dr Cyril Mourning [facility]   Macular degeneration of right eye    Mild depression ([place]) 05/28/2011   Mixed incontinence urge and stress 09/16/2011   S/p lumax desires surgery    Morbid obesity ([place]) 04/10/2011   Numbness of left foot    4 toes   Osteoporosis 12/15/2016   Overview:  Dr Cletis Media   Paget's disease of vulva [facility])    Personal history of chemotherapy 1997   Personal history of radiation therapy 2005   PONV (postoperative nausea and vomiting)    and HARD TO WAKE   Seasonal  allergies    Shortness of breath    SKIN CANCER, HX OF 08/23/2007   Qualifier: Diagnosis of  By: Doy Mince LPN, Megan     SUI (stress urinary incontinence, female)    Thrombocytopenic disorder ([place]) 02/25/2017   Wears glasses     Family History  Problem Relation Age of Onset   Kidney cancer Father 58       metastatic    Alzheimer's disease Mother    Other Mother        ? Cancer   Heart attack Maternal Grandmother    Alcohol abuse Brother    Bladder Cancer Brother        dx 74s   Breast cancer Paternal Aunt        dx after 36   Bladder Cancer Paternal Aunt        dx after 41   Breast cancer Cousin        two paternal female cousins; unknown age of diagnosis   Cancer Maternal Aunt        ? gallbladder; dx 23s   Cancer Maternal Uncle        unknown type; ? pancreatic ? colon; dx after 25   Brain cancer Paternal Uncle        unknown age of dx   Melanoma Brother        dx late 68s-early 60s   Bone cancer Maternal Uncle        dx after 26   Leukemia Maternal Uncle        dx after 58   Bone cancer Maternal Uncle        dx after 12   Lung cancer Maternal Uncle        dx after 22   Brain cancer Cousin        two maternal female cousins; dx after 76   Stomach cancer Paternal Aunt        unknown age of dx    Past Surgical History:  Procedure Laterality Date   BLADDER SUSPENSION  10/13/2011   Procedure: TRANSVAGINAL TAPE (TVT) PROCEDURE;  Surgeon: Delice Lesch, MD;  Location: [facility];  Service: Gynecology;  Laterality: N/A;   BREAST BIOPSY Left 03/19/2004   BREAST LUMPECTOMY Left 03-27-2004   CARDIAC CATHETERIZATION  10-30-2000  dr Wynonia Lawman   normal coronary arteries and LVF/  ef 65-70%   CLOSED MANIPULATION  W/ OPEN REDUCTION EXCHANGE TIBIAL FEMORAL BEARING POST TOTAL LEFT KNEE  04-14-2001   CYSTOSCOPY  10/13/2011   Procedure: CYSTOSCOPY;  Surgeon: Delice Lesch, MD;  Location: [facility];  Service: Gynecology;  Laterality: Bilateral;   DIAGNOSTIC LAPAROSCOPY     Colon d/t SBO    DILATION AND CURETTAGE OF UTERUS  1968   FOOT SURGERY Left 02-01-2009   TRIPLE ARTHRODESIS   FOOT SURGERY Left 11/14   I&D left foot with woundvac placement   INCISIONAL HERNIA REPAIR  05/05/2011   Procedure: LAPAROSCOPIC INCISIONAL HERNIA;  Surgeon: Edward Jolly, MD;  Location: [facility];  Service: General;  Laterality: N/A;  LAPAROSCOPIC REPAIR INCARCERATED INCISIONAL HERNIA  WITH MESH   KNEE ARTHROSCOPY Bilateral left 01-1984 & 04-1992/   right 16-1096   LAPAROSCOPIC CHOLECYSTECTOMY  06-21-2010   LEFT HEART CATH AND CORONARY ANGIOGRAPHY N/A 04/13/2017   Procedure: LEFT HEART CATH AND CORONARY ANGIOGRAPHY;  Surgeon: Jettie Booze, MD;  Location: [facility];  Service: Cardiovascular;  Laterality: N/A;   LUMBAR SPINE SURGERY  1998   L5 -- S1   MASTECTOMY Right 1997   W/  LYMPH NODE DISSECTION AND RECONSTRUCTION WITH IMPLANTS AND EXPANDER   NEGATIVE SLEEP STUDY  12/26/2015   PORT-A-CATH PLACEMENT AND REMOVAL  1997   REMOVAL RIGHT BREAST IMPLANT AND CAPSULECTOMY  06-03-1999   REVERSE SHOULDER ARTHROPLASTY Left 10/25/2019   Procedure: LEFT REVERSE SHOULDER ARTHROPLASTY;  Surgeon: Meredith Pel, MD;  Location: [facility];  Service: Orthopedics;  Laterality: Left;   REVISION TOTAL KNEE ARTHROPLASTY Left 07-07-2005  &  12-10-2001   12-10-2001  REMOVAL TOTAL KNEE/ I&D / INSERTION ANTIBIOTIC SPACER   REVISION TOTAL KNEE ARTHROPLASTY Right 07-07-2005   ROTATOR CUFF REPAIR Right    TENDON REPAIR RIGHT RING FINGER  2011   THUMB TRIGGER RELEASE Right 1993   THYROIDECTOMY, PARTIAL  1983   TONSILLECTOMY  1968   TOTAL KNEE ARTHROPLASTY Bilateral right 05-04-2000/  left 03-29-2001   VULVECTOMY  05/27/2012   Procedure: WIDE EXCISION VULVECTOMY;  Surgeon: Imagene Gurney A. Alycia Rossetti, MD;  Location: [facility];  Service: Gynecology;  Laterality: N/A;  Wide Local Excision of Vulva    VULVECTOMY N/A 11/23/2012   Procedure: WIDE LOCAL EXCISION OF THE VULVA;  Surgeon: Imagene Gurney A. Alycia Rossetti, MD;  Location: [facility];  Service: Gynecology;  Laterality: N/A;  left inferior vulvar biopsy excision left superior lesion left inferior vulvar excision   VULVECTOMY N/A 08/02/2013   Procedure: WIDE LOCAL  EXCISION VULVA;  Surgeon: Imagene Gurney A. Alycia Rossetti, MD;  Location: [facility];  Service: Gynecology;  Laterality: N/A;   VULVECTOMY Left 03/29/2014   Procedure: WIDE LOCAL EXCISION OF LEFT VULVA ;  Surgeon: Imagene Gurney A. Alycia Rossetti, MD;  Location: [facility];  Service: Gynecology;  Laterality: Left;   VULVECTOMY Bilateral 01/24/2015   Procedure: WIDE LOCAL EXCISION VULVA;  Surgeon: [name], MD;  Location: [facility];  Service: Gynecology;  Laterality: Bilateral;   VULVECTOMY PARTIAL  07-16-2010   Social History   Occupational History   Not on file  Tobacco Use   Smoking status: Former    Packs/day: 2.00    Years: 41.00    Pack  years: 82.00    Types: Cigarettes    Quit date: 06/05/1999    Years since quitting: 21.4   Smokeless tobacco: Never  Vaping Use   Vaping Use: Never used  Substance and Sexual Activity   Alcohol use: No   Drug use: No   Sexual activity: Not on file

## 2020-12-04 ENCOUNTER — Ambulatory Visit: Payer: Medicare HMO | Admitting: Surgical

## 2020-12-10 ENCOUNTER — Other Ambulatory Visit: Payer: Medicare HMO | Admitting: *Deleted

## 2020-12-10 ENCOUNTER — Other Ambulatory Visit: Payer: Self-pay

## 2020-12-10 DIAGNOSIS — I493 Ventricular premature depolarization: Secondary | ICD-10-CM

## 2020-12-10 DIAGNOSIS — I5032 Chronic diastolic (congestive) heart failure: Secondary | ICD-10-CM

## 2020-12-10 LAB — BASIC METABOLIC PANEL
BUN/Creatinine Ratio: 31 — ABNORMAL HIGH (ref 12–28)
BUN: 20 mg/dL (ref 8–27)
CO2: 28 mmol/L (ref 20–29)
Calcium: 9.6 mg/dL (ref 8.7–10.3)
Chloride: 99 mmol/L (ref 96–106)
Creatinine, Ser: 0.64 mg/dL (ref 0.57–1.00)
Glucose: 124 mg/dL — ABNORMAL HIGH (ref 65–99)
Potassium: 4.6 mmol/L (ref 3.5–5.2)
Sodium: 141 mmol/L (ref 134–144)
eGFR: 93 mL/min/{1.73_m2} (ref >59)

## 2020-12-15 ENCOUNTER — Other Ambulatory Visit: Payer: Self-pay | Admitting: Student

## 2020-12-21 ENCOUNTER — Other Ambulatory Visit: Payer: Self-pay | Admitting: Student

## 2021-01-08 ENCOUNTER — Telehealth: Payer: Self-pay | Admitting: Orthopedic Surgery

## 2021-01-08 NOTE — Telephone Encounter (Signed)
Patient called needing Rx refilled Hydrocodone. The number to contact patient is [phone] Patient said the doctor that she was scheduled for was the wrong doctor. The doctor was Dr. Victorino December. The doctor told her he does not do that type of surgery. Patient said she is rescheduled with a PA  Mechele Claude on  August 24th.  Patient said she will not see the doctor until September. The number to contact patient is   [phone]

## 2021-01-08 NOTE — Telephone Encounter (Signed)
Please advise 

## 2021-01-29 ENCOUNTER — Other Ambulatory Visit: Payer: Self-pay

## 2021-01-29 ENCOUNTER — Inpatient Hospital Stay: Payer: Medicare HMO | Attending: Gynecologic Oncology | Admitting: Gynecologic Oncology

## 2021-01-29 VITALS — BP 154/55 | HR 70 | Temp 98.3°F | Resp 18 | Wt 255.3 lb

## 2021-01-29 DIAGNOSIS — M1990 Unspecified osteoarthritis, unspecified site: Secondary | ICD-10-CM | POA: Insufficient documentation

## 2021-01-29 DIAGNOSIS — J45909 Unspecified asthma, uncomplicated: Secondary | ICD-10-CM | POA: Insufficient documentation

## 2021-01-29 DIAGNOSIS — M797 Fibromyalgia: Secondary | ICD-10-CM | POA: Insufficient documentation

## 2021-01-29 DIAGNOSIS — Z9013 Acquired absence of bilateral breasts and nipples: Secondary | ICD-10-CM | POA: Insufficient documentation

## 2021-01-29 DIAGNOSIS — Z8544 Personal history of malignant neoplasm of other female genital organs: Secondary | ICD-10-CM | POA: Diagnosis present

## 2021-01-29 DIAGNOSIS — M810 Age-related osteoporosis without current pathological fracture: Secondary | ICD-10-CM | POA: Insufficient documentation

## 2021-01-29 DIAGNOSIS — K219 Gastro-esophageal reflux disease without esophagitis: Secondary | ICD-10-CM | POA: Insufficient documentation

## 2021-01-29 DIAGNOSIS — C4499 Other specified malignant neoplasm of skin, unspecified: Secondary | ICD-10-CM

## 2021-01-29 DIAGNOSIS — F32A Depression, unspecified: Secondary | ICD-10-CM | POA: Diagnosis not present

## 2021-01-29 DIAGNOSIS — Z87891 Personal history of nicotine dependence: Secondary | ICD-10-CM | POA: Insufficient documentation

## 2021-01-29 DIAGNOSIS — E039 Hypothyroidism, unspecified: Secondary | ICD-10-CM | POA: Insufficient documentation

## 2021-01-29 DIAGNOSIS — D696 Thrombocytopenia, unspecified: Secondary | ICD-10-CM | POA: Insufficient documentation

## 2021-01-29 DIAGNOSIS — M47816 Spondylosis without myelopathy or radiculopathy, lumbar region: Secondary | ICD-10-CM | POA: Insufficient documentation

## 2021-01-29 DIAGNOSIS — Z7982 Long term (current) use of aspirin: Secondary | ICD-10-CM | POA: Insufficient documentation

## 2021-01-29 DIAGNOSIS — Z9221 Personal history of antineoplastic chemotherapy: Secondary | ICD-10-CM | POA: Diagnosis not present

## 2021-01-29 DIAGNOSIS — F419 Anxiety disorder, unspecified: Secondary | ICD-10-CM | POA: Diagnosis not present

## 2021-01-29 DIAGNOSIS — Z9079 Acquired absence of other genital organ(s): Secondary | ICD-10-CM | POA: Insufficient documentation

## 2021-01-29 DIAGNOSIS — I249 Acute ischemic heart disease, unspecified: Secondary | ICD-10-CM | POA: Diagnosis not present

## 2021-01-29 DIAGNOSIS — Z853 Personal history of malignant neoplasm of breast: Secondary | ICD-10-CM | POA: Diagnosis not present

## 2021-01-29 DIAGNOSIS — Z791 Long term (current) use of non-steroidal anti-inflammatories (NSAID): Secondary | ICD-10-CM | POA: Diagnosis not present

## 2021-01-29 DIAGNOSIS — N762 Acute vulvitis: Secondary | ICD-10-CM | POA: Diagnosis not present

## 2021-01-29 DIAGNOSIS — Z923 Personal history of irradiation: Secondary | ICD-10-CM | POA: Diagnosis not present

## 2021-01-29 DIAGNOSIS — Z79899 Other long term (current) drug therapy: Secondary | ICD-10-CM | POA: Diagnosis not present

## 2021-01-29 NOTE — Progress Notes (Signed)
Follow-up Note: Gyn-Onc   [name] 75 y.o. female  Chief Complaint  Patient presents with   Extramammary Paget's disease    Assessment : Long-standing history of Paget's disease of the vulva. No gross disease on today's exam.  Plan:    Follow-up in 3 months for vulvar inspection with Dr Berline Lopes.   I see no cause for her abdominal cramps, and suspect they are colonic. If they persist, recommend TVUS.   HPI:  Patient is a 75 year old with a history of a partial simple vulvectomy in February of 2012 for vulvar Paget's disease treated for many years by Dr Alycia Rossetti. Some of the surgical margins were positive. Her postoperative course was uncomplicated. She was last seen by our service in 12/14. At that time there was no evidence of any lesions consistent with Paget's. However, there is considerable amount of excoriations throughout the entire vulvar and medial thighs consistent with chronic vulvitis.In December 2013 she had a hernia repair due to bowel obstruction by Dr. Excell Seltzer with permanent mesh. In May 2013 she also had a TVT bladder by Dr. Everett Graff.    She underwent a wide local excision of the left vulva on January 2 of year 2014. Operative findings included 2 separate 3 x 3 and 1.2 cm lesions consistent with Paget's on the left.  Pathology revealed 2 foci of extramammary Paget's disease extending to the left inked margin in the right and anterior tip margins at multiple foci. The posterior margin was negative for malignancy.    On 11/23/2012 showed a repeat wide local excision of vulva x2 with the posterior fourchette biopsy. Findings revealed along the left labia minora/vagina is a Paget's disease measuring approximately 2.5 x 1 cm in the superior aspect of her prior left vulvar excision. The inferior aspect of a similar lesion. Biopsy and excision was performed in this area showing extramammary paget disease in all sites.   She had been seen in January of 2015. At that time  exam was concerning for Paget's.    On 08/02/2013 she underwent wide local excisions of the vulva x2. Operative findings included pale pink raised lesion measuring 1 x 0.5 cm the left upper vulva near the prior excision site. There is a pale raised lesion measuring 1.5 x 1 cm on the left crossing midline perineal body. Pathology revealed left superior vulvar excision with extramammary Paget's disease extending to the 9:00 margin. The other margins were negative for tumor. Of the left of midline lesion she extramammary Paget's disease extending to the 12:00 margin and focally at the 6:00 margin but otherwise negative margins.    She was seen on March 16, 2014 at which time there was an area on the left vulva with hyperkeratosis. A biopsy of that area was performed consistent with Paget's disease. On March 29, 2014 she underwent wide local excision of the vulva. On exam she had a 1 cm left vulvar lesion just below the left labia minora. She underwent a wide local excision. He received a 3.7 x 1.7 x 0.3 cm lesion. There was extramammary Paget's disease extending to the 6 to 9:00 margin in the 12:00 margin. This is despite grossly negative margins on physical examination.    She had an excision in November, 2015 that revealed extramammary pagets disease, extending to 6-9 o'clock and 12 o'clock margins.    She was seen in February 2016 at which time there was concern for recurrent disease. She decided to proceed with Aldara therapy. She used for  approximate 4 weeks and then decided to stop it secondary to the expense of the medication. She then started using the clobetasol. While she was using the Aldara all the pruritus in her vulva resolved itself. Since she stopped it and was using the steroid cream the pruritus returned.   She was seen in May 2016 at which time she has small area consistent with Paget's on the right labia minora and a larger patch on the left. Biopsy on the right revealed extramammary  Paget's. After discussion she wished to try the Aldara again.    She was seen December 27, 2014 at which time there was evidence clearly a Paget's disease and she scheduled for surgery. On January 24, 2015 she underwent bilateral wide local excisions of the vulva. On the right vulva there was a 2 cm patch of Paget's disease in the mid labia majora. On the left side. The remaining labia minora in the area prior to the excision are smaller lesion consistent with Paget's disease. Pathology confirmed pagets with positive margins.    The patient was taken to the operating room on 01/31/2016 for preoperative mapping of her lesion for her planned wide local excision of the left vulva and smaller excision on the right. Operative findings at that time were notable for surgically absent inferior aspect of the left labium minora and majora. Multiple areas with white flaky skin changes were biopsied and the abnormal-appearing tissue. Changes noted in the area of the clitoris and perineum and at the introitus just distal to the hymen. Biopsies were collected on all of the sites. Pagets disease was confirmed with positive margins.   She was evaluated in September, 2020 and biopsy of the vulva confirmed recurrent Pagets disease. She underwent bilateral simple partial vulvectomy on 03/04/19 with plastic surgery reconstruction (gracilis flap). Pathology confirmed recurrent pagets with positive margins bilaterally.   In June, 2021 she had a vulvar biopsy positive for pagets which was treated with Imiquimod by Dr Alycia Rossetti at [facility].  Interval History:  She returned to see me today for follow-up The patient has symptoms of candida but no symptoms of pagets irritation. She had menstrual like cramps this morning and "lots of gas".    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   Cancer of right breast, stage 2 ([place]) 05/28/2011   2.8 cm invasive 1 node pos ER pos S/P mastectomy 06/1995 then AC x 4 then Tam X 5    Chest pain, rule out acute myocardial infarction 04/10/2017   Chronic constipation    Chronic cystitis 04/09/2017   Cough 08/23/2007   Qualifier: Diagnosis of  By: Gwenette Greet MD, Armando Reichert    Depression    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   Dysrhythmia 2020   Had Afib   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   Fibromyalgia    GERD (gastroesophageal reflux disease)    History of adenomatous polyp of colon 09/30/2018   History of breast cancer no recurrence   1997  right breast cancer  s/p  mastectomy w/ snl dissection and chemoradiation/  2005  left breast cancer DCIS  s/p  lumpectomy and radiation   History of colon polyps    2007 hyperplagia   History of esophageal dilatation    2008   History of peptic ulcer    History of small bowel obstruction    2012   Hypothyroid 05/28/2011   Hypothyroidism    Incomplete emptying of bladder 07/30/2016   Left foot pain 12/11/2015   Overview:  Dr Cyril Mourning [facility]   Macular degeneration of right eye    Mild depression ([place]) 05/28/2011   Mixed incontinence urge and stress 09/16/2011   S/p lumax desires surgery    Morbid obesity ([place]) 04/10/2011   Numbness of left foot    4 toes   Osteoporosis 12/15/2016   Overview:  Dr Cletis Media   Paget's disease of vulva [facility])    Personal history of  chemotherapy 1997   Personal history of radiation therapy 2005   PONV (postoperative nausea and vomiting)    and HARD TO WAKE   Seasonal allergies    Shortness of breath    SKIN CANCER, HX OF 08/23/2007   Qualifier: Diagnosis of  By: Doy Mince LPN, Megan     SUI (stress urinary incontinence, female)    Thrombocytopenic disorder ([place]) 02/25/2017   Wears glasses     Past Surgical History:  Procedure Laterality Date   BLADDER SUSPENSION  10/13/2011   Procedure: TRANSVAGINAL TAPE (TVT) PROCEDURE;  Surgeon: Delice Lesch, MD;  Location: [facility];  Service: Gynecology;  Laterality: N/A;   BREAST BIOPSY Left 03/19/2004   BREAST LUMPECTOMY Left 03-27-2004   CARDIAC CATHETERIZATION  10-30-2000  dr Wynonia Lawman   normal coronary arteries and LVF/  ef 65-70%   CLOSED MANIPULATION  W/ OPEN REDUCTION EXCHANGE TIBIAL FEMORAL BEARING POST TOTAL LEFT KNEE  04-14-2001   CYSTOSCOPY  10/13/2011   Procedure: CYSTOSCOPY;  Surgeon: Delice Lesch, MD;  Location: [facility];  Service: Gynecology;  Laterality: Bilateral;   DIAGNOSTIC LAPAROSCOPY     Colon d/t SBO   DILATION AND CURETTAGE OF UTERUS  1968   FOOT SURGERY Left 02-01-2009   TRIPLE ARTHRODESIS   FOOT SURGERY Left 11/14   I&D left foot with woundvac placement   INCISIONAL HERNIA REPAIR  05/05/2011   Procedure: LAPAROSCOPIC INCISIONAL HERNIA;  Surgeon: Edward Jolly, MD;  Location: [facility];  Service: General;  Laterality: N/A;  LAPAROSCOPIC REPAIR INCARCERATED INCISIONAL HERNIA  WITH MESH   KNEE ARTHROSCOPY Bilateral left 01-1984 & 04-1992/   right [id number]   LAPAROSCOPIC CHOLECYSTECTOMY  06-21-2010   LEFT HEART CATH AND CORONARY ANGIOGRAPHY N/A 04/13/2017   Procedure: LEFT HEART CATH AND CORONARY ANGIOGRAPHY;  Surgeon: Jettie Booze, MD;  Location: [place] [facility];  Service: Cardiovascular;  Laterality: N/A;   LUMBAR SPINE SURGERY  1998   L5 -- S1   MASTECTOMY Right 1997   W/  LYMPH NODE DISSECTION AND RECONSTRUCTION WITH IMPLANTS AND  EXPANDER   NEGATIVE SLEEP STUDY  12/26/2015   PORT-A-CATH PLACEMENT AND REMOVAL  1997   REMOVAL RIGHT BREAST IMPLANT AND CAPSULECTOMY  06-03-1999   REVERSE SHOULDER ARTHROPLASTY Left 10/25/2019  Procedure: LEFT REVERSE SHOULDER ARTHROPLASTY;  Surgeon: Meredith Pel, MD;  Location: [facility];  Service: Orthopedics;  Laterality: Left;   REVISION TOTAL KNEE ARTHROPLASTY Left 07-07-2005  &  12-10-2001   12-10-2001  REMOVAL TOTAL KNEE/ I&D / INSERTION ANTIBIOTIC SPACER   REVISION TOTAL KNEE ARTHROPLASTY Right 07-07-2005   ROTATOR CUFF REPAIR Right    TENDON REPAIR RIGHT RING FINGER  2011   THUMB TRIGGER RELEASE Right 1993   THYROIDECTOMY, PARTIAL  1983   TONSILLECTOMY  1968   TOTAL KNEE ARTHROPLASTY Bilateral right 05-04-2000/  left 03-29-2001   VULVECTOMY  05/27/2012   Procedure: WIDE EXCISION VULVECTOMY;  Surgeon: Imagene Gurney A. Alycia Rossetti, MD;  Location: [facility];  Service: Gynecology;  Laterality: N/A;  Wide Local Excision of Vulva    VULVECTOMY N/A 11/23/2012   Procedure: WIDE LOCAL EXCISION OF THE VULVA;  Surgeon: Imagene Gurney A. Alycia Rossetti, MD;  Location: [facility];  Service: Gynecology;  Laterality: N/A;  left inferior vulvar biopsy excision left superior lesion left inferior vulvar excision   VULVECTOMY N/A 08/02/2013   Procedure: WIDE LOCAL  EXCISION VULVA;  Surgeon: Imagene Gurney A. Alycia Rossetti, MD;  Location: [facility];  Service: Gynecology;  Laterality: N/A;   VULVECTOMY Left 03/29/2014   Procedure: WIDE LOCAL EXCISION OF LEFT VULVA ;  Surgeon: Imagene Gurney A. Alycia Rossetti, MD;  Location: [facility];  Service: Gynecology;  Laterality: Left;   VULVECTOMY Bilateral 01/24/2015   Procedure: WIDE LOCAL EXCISION VULVA;  Surgeon: [name], MD;  Location: [place];  Service: Gynecology;  Laterality: Bilateral;   VULVECTOMY PARTIAL  07-16-2010    Current Outpatient Medications  Medication Sig Dispense Refill   acetaminophen (TYLENOL) 500 MG tablet Take 1,000 mg by mouth every 6 (six) hours as needed for mild  pain or moderate pain.     albuterol (PROVENTIL HFA;VENTOLIN HFA) 108 (90 BASE) MCG/ACT inhaler Inhale 2 puffs into the lungs every 4 (four) hours as needed for wheezing or shortness of breath. Reported on 08/01/2015     aspirin EC 81 MG EC tablet Take 1 tablet (81 mg total) daily by mouth. 30 tablet 0   atorvastatin (LIPITOR) 40 MG tablet Take 1 tablet (40 mg total) daily at 6 PM by mouth. 30 tablet 0   buPROPion (WELLBUTRIN XL) 150 MG 24 hr tablet Take 1 tablet by mouth every morning.     celecoxib (CELEBREX) 200 MG capsule Take 1 capsule (200 mg total) by mouth daily. 30 capsule 0   Cholecalciferol (VITAMIN D3) 1000 UNITS CAPS Take 2,000 Units by mouth daily.      citalopram (CELEXA) 40 MG tablet Take 1 tablet by mouth daily.     clindamycin (CLEOCIN) 150 MG capsule Take 150 mg by mouth 3 (three) times daily. Only foe dental visits     furosemide (LASIX) 40 MG tablet Take 1.5 tablets (60 mg total) by mouth daily. Take extra 1/2 tablet as needed for weight gain. 150 tablet 3   hydrOXYzine (VISTARIL) 25 MG capsule Take 25 mg by mouth 3 (three) times daily as needed for itching.     ibandronate (BONIVA) 150 MG tablet Take 150 mg by mouth every 30 (thirty) days. Take in the morning with a full glass of water, on an empty stomach, and do not take anything else by mouth or lie down for the next 30 min.     ipratropium-albuterol (DUONEB) 0.5-2.5 (3) MG/3ML SOLN Take 3 mLs by nebulization every 4 (four) hours as needed (Wheezing).  ketoconazole (NIZORAL) 2 % shampoo Apply 1 application topically every 14 (fourteen) days.     levothyroxine (SYNTHROID) 200 MCG tablet Take 200 mcg by mouth daily before breakfast.      montelukast (SINGULAIR) 10 MG tablet Take 10 mg by mouth daily.     Multiple Vitamin (MULTIVITAMIN WITH MINERALS) TABS tablet Take 1 tablet daily by mouth.     nystatin (MYCOSTATIN/NYSTOP) powder Apply topically 4 (four) times daily. 60 g 6   nystatin cream (MYCOSTATIN) Apply 1 application  topically 2 (two) times daily. 30 g 6   pantoprazole (PROTONIX) 40 MG tablet Take 40 mg by mouth daily.     polyvinyl alcohol (LIQUIFILM TEARS) 1.4 % ophthalmic solution Place 1 drop into both eyes daily as needed for dry eyes.      spironolactone (ALDACTONE) 25 MG tablet Take 1 tablet (25 mg total) by mouth daily. 90 tablet 3   bisoprolol (ZEBETA) 5 MG tablet TAKE 1 TABLET BY MOUTH EVERY DAY (Patient not taking: Reported on 01/29/2021) 90 tablet 3   budesonide-formoterol (SYMBICORT) 160-4.5 MCG/ACT inhaler Inhale 2 puffs into the lungs daily as needed (Wheezing in the winter). (Patient not taking: Reported on 01/29/2021)     HYDROcodone-acetaminophen (NORCO/VICODIN) 5-325 MG tablet Take 1 tablet by mouth every 8 (eight) hours as needed for moderate pain. (Patient not taking: Reported on 01/29/2021) 30 tablet 0   methocarbamol (ROBAXIN) 500 MG tablet TAKE 1 TABLET BY MOUTH EVERY 8 HOURS AS NEEDED (Patient not taking: Reported on 01/29/2021) 30 tablet 0   mexiletine (MEXITIL) 150 MG capsule TAKE 1 CAPSULE (150 MG TOTAL) BY MOUTH 3 (THREE) TIMES DAILY. (Patient not taking: Reported on 01/29/2021) 90 capsule 2   No current facility-administered medications for this visit.    Social History   Socioeconomic History   Marital status: Married    Spouse name: Not on file   Number of children: Not on file   Years of education: Not on file   Highest education level: Not on file  Occupational History   Not on file  Tobacco Use   Smoking status: Former    Packs/day: 2.00    Years: 41.00    Pack years: 82.00    Types: Cigarettes    Quit date: 06/05/1999    Years since quitting: 21.6   Smokeless tobacco: Never  Vaping Use   Vaping Use: Never used  Substance and Sexual Activity   Alcohol use: No   Drug use: No   Sexual activity: Not on file  Other Topics Concern   Not on file  Social History Narrative   Not on file   Social Determinants of Health   Financial Resource Strain: Not on file  Food  Insecurity: Not on file  Transportation Needs: Not on file  Physical Activity: Not on file  Stress: Not on file  Social Connections: Not on file  Intimate Partner Violence: Not on file    Family History  Problem Relation Age of Onset   Kidney cancer Father 32       metastatic    Alzheimer's disease Mother    Other Mother        ? Cancer   Heart attack Maternal Grandmother    Alcohol abuse Brother    Bladder Cancer Brother        dx 5s   Breast cancer Paternal Aunt        dx after 51   Bladder Cancer Paternal Aunt  dx after 69   Breast cancer Cousin        two paternal female cousins; unknown age of diagnosis   Cancer Maternal Aunt        ? gallbladder; dx 76s   Cancer Maternal Uncle        unknown type; ? pancreatic ? colon; dx after 3   Brain cancer Paternal Uncle        unknown age of dx   Melanoma Brother        dx late 14s-early 60s   Bone cancer Maternal Uncle        dx after 34   Leukemia Maternal Uncle        dx after 82   Bone cancer Maternal Uncle        dx after 62   Lung cancer Maternal Uncle        dx after 66   Brain cancer Cousin        two maternal female cousins; dx after 40   Stomach cancer Paternal Aunt        unknown age of dx   Vitals: Blood pressure (!) 137/59, pulse 91, temperature 98.5 F (36.9 C), temperature source Oral, resp. rate 18, height '5\' 5"'$  (1.651 m), weight 242 lb (109.8 kg), SpO2 97 %.  Physical Exam: General : The patient is a healthy woman in no acute distress.   Groins: No lymphadenopathy. Candidal changes present.   Vulva: There is a patch of erythematous skin change on the right labia minora which is stable in appearance. There is perianal erythema, also stable in appearance. No grossly invasive disease.    Thereasa Solo, MD 01/29/2021, 1:43 PM

## 2021-01-29 NOTE — Patient Instructions (Addendum)
Dr Denman George recommends 3 month follow-up with her partner Dr Berline Lopes.  Her office can be reached a [place].  If your abdominal cramps increase prior to that visit, notify her office and we will order an ultrasound.

## 2021-03-03 NOTE — Progress Notes (Signed)
PCP:  Chesley Noon, MD Primary Cardiologist: None Electrophysiologist: Will Meredith Leeds, MD   VENNESA Moreno is a 75 y.o. female seen today for Will Meredith Leeds, MD for routine electrophysiology followup.  Since last being seen in our clinic the patient reports doing OK. She has noticed more orthopnea and DOE over the past several weeks. More edema, especially in L leg (multiple surgeries). On several occasions, she has had a mild chest pressure accompanying her SOB. Relieves with rest. Denies lightheadedness or dizziness. No lightheadedness or syncope.   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   Cancer of right breast, stage 2 ([place]) 05/28/2011   2.8 cm invasive 1 node pos ER pos S/P mastectomy 06/1995 then AC x 4 then Tam X 5    Chest pain, rule out acute myocardial infarction 04/10/2017   Chronic constipation    Chronic cystitis 04/09/2017   Cough 08/23/2007   Qualifier: Diagnosis of  By: Gwenette Greet MD, Armando Reichert    Depression    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   Dysrhythmia 2020   Had Afib   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   Fibromyalgia    GERD (gastroesophageal reflux disease)    History of adenomatous polyp of colon 09/30/2018   History of breast cancer no recurrence   1997  right breast cancer  s/p  mastectomy w/ snl dissection and chemoradiation/  2005  left breast cancer DCIS  s/p  lumpectomy and radiation   History of colon polyps    2007 hyperplagia   History of esophageal dilatation    2008   History of peptic ulcer    History of small bowel obstruction     2012   Hypothyroid 05/28/2011   Hypothyroidism    Incomplete emptying of bladder 07/30/2016   Left foot pain 12/11/2015   Overview:  Dr Cyril Mour[facility]   Macular degeneration of right eye    Mild depression 05/28/2011   Mixed incontinence urge and stress 09/16/2011   S/p lumax desires surgery    Morbid obesity ([place]) 04/10/2011   Numbness of left foot    4 toes   Osteoporosis 12/15/2016   Overview:  Dr Cletis Media   Paget's disease of vulva [facility])    Personal history of chemotherapy 1997   Personal history of radiation therapy 2005   PONV (postoperative nausea and vomiting)    and HARD TO WAKE   Seasonal allergies    Shortness of breath    SKIN CANCER, HX OF 08/23/2007   Qualifier: Diagnosis of  By: Doy Mince LPN, Megan     SUI (stress urinary incontinence, female)    Thrombocytopenic disorder ([place]) 02/25/2017   Wears glasses    Past Surgical History:  Procedure Laterality Date   BLADDER SUSPENSION  10/13/2011   Procedure: TRANSVAGINAL TAPE (TVT) PROCEDURE;  Surgeon: Delice Lesch, MD;  Location: [facility];  Service: Gynecology;  Laterality: N/A;   BREAST BIOPSY Left 03/19/2004   BREAST LUMPECTOMY Left 03-27-2004   CARDIAC CATHETERIZATION  10-30-2000  dr Wynonia Lawman  normal coronary arteries and LVF/  ef 65-70%   CLOSED MANIPULATION  W/ OPEN REDUCTION EXCHANGE TIBIAL FEMORAL BEARING POST TOTAL LEFT KNEE  04-14-2001   CYSTOSCOPY  10/13/2011   Procedure: CYSTOSCOPY;  Surgeon: Delice Lesch, MD;  Location: [facility];  Service: Gynecology;  Laterality: Bilateral;   DIAGNOSTIC LAPAROSCOPY     Colon d/t SBO   DILATION AND CURETTAGE OF UTERUS  1968   FOOT SURGERY Left 02-01-2009   TRIPLE ARTHRODESIS   FOOT SURGERY Left 11/14   I&D left foot with woundvac placement   INCISIONAL HERNIA REPAIR  05/05/2011   Procedure: LAPAROSCOPIC INCISIONAL HERNIA;  Surgeon: Edward Jolly, MD;  Location: [facility];  Service: General;  Laterality: N/A;  LAPAROSCOPIC REPAIR INCARCERATED INCISIONAL HERNIA  WITH MESH    KNEE ARTHROSCOPY Bilateral left 01-1984 & 04-1992/   right [id number]   LAPAROSCOPIC CHOLECYSTECTOMY  06-21-2010   LEFT HEART CATH AND CORONARY ANGIOGRAPHY N/A 04/13/2017   Procedure: LEFT HEART CATH AND CORONARY ANGIOGRAPHY;  Surgeon: Jettie Booze, MD;  Location: [facility];  Service: Cardiovascular;  Laterality: N/A;   LUMBAR SPINE SURGERY  1998   L5 -- S1   MASTECTOMY Right 1997   W/  LYMPH NODE DISSECTION AND RECONSTRUCTION WITH IMPLANTS AND EXPANDER   NEGATIVE SLEEP STUDY  12/26/2015   PORT-A-CATH PLACEMENT AND REMOVAL  1997   REMOVAL RIGHT BREAST IMPLANT AND CAPSULECTOMY  06-03-1999   REVERSE SHOULDER ARTHROPLASTY Left 10/25/2019   Procedure: LEFT REVERSE SHOULDER ARTHROPLASTY;  Surgeon: Meredith Pel, MD;  Location: [facility];  Service: Orthopedics;  Laterality: Left;   REVISION TOTAL KNEE ARTHROPLASTY Left 07-07-2005  &  12-10-2001   12-10-2001  REMOVAL TOTAL KNEE/ I&D / INSERTION ANTIBIOTIC SPACER   REVISION TOTAL KNEE ARTHROPLASTY Right 07-07-2005   ROTATOR CUFF REPAIR Right    TENDON REPAIR RIGHT RING FINGER  2011   THUMB TRIGGER RELEASE Right 1993   THYROIDECTOMY, PARTIAL  1983   TONSILLECTOMY  1968   TOTAL KNEE ARTHROPLASTY Bilateral right 05-04-2000/  left 03-29-2001   VULVECTOMY  05/27/2012   Procedure: WIDE EXCISION VULVECTOMY;  Surgeon: Imagene Gurney A. Alycia Rossetti, MD;  Location: [facility];  Service: Gynecology;  Laterality: N/A;  Wide Local Excision of Vulva    VULVECTOMY N/A 11/23/2012   Procedure: WIDE LOCAL EXCISION OF THE VULVA;  Surgeon: Imagene Gurney A. Alycia Rossetti, MD;  Location: [facility];  Service: Gynecology;  Laterality: N/A;  left inferior vulvar biopsy excision left superior lesion left inferior vulvar excision   VULVECTOMY N/A 08/02/2013   Procedure: WIDE LOCAL  EXCISION VULVA;  Surgeon: Imagene Gurney A. Alycia Rossetti, MD;  Location: [facility];  Service: Gynecology;  Laterality: N/A;   VULVECTOMY Left 03/29/2014   Procedure: WIDE LOCAL EXCISION OF LEFT VULVA ;  Surgeon: Imagene Gurney A. Alycia Rossetti, MD;   Location: [facility];  Service: Gynecology;  Laterality: Left;   VULVECTOMY Bilateral 01/24/2015   Procedure: WIDE LOCAL EXCISION VULVA;  Surgeon: [name], MD;  Location: [facility];  Service: Gynecology;  Laterality: Bilateral;   VULVECTOMY PARTIAL  07-16-2010    Current Outpatient Medications  Medication Sig Dispense Refill   acetaminophen (TYLENOL) 500 MG tablet Take 1,000 mg by mouth every 6 (six) hours as needed for mild pain or moderate pain.     albuterol (PROVENTIL HFA;VENTOLIN HFA) 108 (90 BASE) MCG/ACT inhaler Inhale 2 puffs into the lungs every 4 (four) hours as needed for wheezing or shortness of breath. Reported on 08/01/2015  aspirin EC 81 MG EC tablet Take 1 tablet (81 mg total) daily by mouth. 30 tablet 0   atorvastatin (LIPITOR) 40 MG tablet Take 1 tablet (40 mg total) daily at 6 PM by mouth. 30 tablet 0   bisoprolol (ZEBETA) 5 MG tablet TAKE 1 TABLET BY MOUTH EVERY DAY 90 tablet 3   budesonide-formoterol (SYMBICORT) 160-4.5 MCG/ACT inhaler Inhale 2 puffs into the lungs daily as needed (Wheezing in the winter).     buPROPion (WELLBUTRIN XL) 150 MG 24 hr tablet Take 1 tablet by mouth every morning.     celecoxib (CELEBREX) 200 MG capsule Take 1 capsule (200 mg total) by mouth daily. 30 capsule 0   Cholecalciferol (VITAMIN D3) 1000 UNITS CAPS Take 2,000 Units by mouth daily.      citalopram (CELEXA) 40 MG tablet Take 1 tablet by mouth daily.     clindamycin (CLEOCIN) 150 MG capsule Take 150 mg by mouth 3 (three) times daily. Only foe dental visits     furosemide (LASIX) 40 MG tablet Take 1.5 tablets (60 mg total) by mouth daily. Take extra 1/2 tablet as needed for weight gain. 150 tablet 3   hydrOXYzine (VISTARIL) 25 MG capsule Take 25 mg by mouth 3 (three) times daily as needed for itching.     ibandronate (BONIVA) 150 MG tablet Take 150 mg by mouth every 30 (thirty) days. Take in the morning with a full glass of water, on an empty stomach,  and do not take anything else by mouth or lie down for the next 30 min.     ipratropium-albuterol (DUONEB) 0.5-2.5 (3) MG/3ML SOLN Take 3 mLs by nebulization every 4 (four) hours as needed (Wheezing).     ketoconazole (NIZORAL) 2 % shampoo Apply 1 application topically every 14 (fourteen) days.     levothyroxine (SYNTHROID) 200 MCG tablet Take 200 mcg by mouth daily before breakfast.      mexiletine (MEXITIL) 150 MG capsule TAKE 1 CAPSULE (150 MG TOTAL) BY MOUTH 3 (THREE) TIMES DAILY. 90 capsule 2   montelukast (SINGULAIR) 10 MG tablet Take 10 mg by mouth daily.     Multiple Vitamin (MULTIVITAMIN WITH MINERALS) TABS tablet Take 1 tablet daily by mouth.     nystatin (MYCOSTATIN/NYSTOP) powder Apply topically 4 (four) times daily. 60 g 6   nystatin cream (MYCOSTATIN) Apply 1 application topically 2 (two) times daily. 30 g 6   pantoprazole (PROTONIX) 40 MG tablet Take 40 mg by mouth daily.     polyvinyl alcohol (LIQUIFILM TEARS) 1.4 % ophthalmic solution Place 1 drop into both eyes daily as needed for dry eyes.      HYDROcodone-acetaminophen (NORCO/VICODIN) 5-325 MG tablet Take 1 tablet by mouth every 8 (eight) hours as needed for moderate pain. (Patient not taking: Reported on 03/04/2021) 30 tablet 0   methocarbamol (ROBAXIN) 500 MG tablet TAKE 1 TABLET BY MOUTH EVERY 8 HOURS AS NEEDED (Patient not taking: Reported on 03/04/2021) 30 tablet 0   spironolactone (ALDACTONE) 25 MG tablet Take 1 tablet (25 mg total) by mouth daily. 90 tablet 3   No current facility-administered medications for this visit.    Allergies  Allergen Reactions   Tape     "thick clear plastic tape" causes blisters    Hydromorphone Hcl Itching and Rash   Macrobid [Nitrofurantoin] Rash   Morphine And Related Itching and Rash   Penicillins Hives and Swelling    1968   Valisone [Betamethasone] Rash    Social History   Socioeconomic History  Marital status: Married    Spouse name: Not on file   Number of children:  Not on file   Years of education: Not on file   Highest education level: Not on file  Occupational History   Not on file  Tobacco Use   Smoking status: Former    Packs/day: 2.00    Years: 41.00    Pack years: 82.00    Types: Cigarettes    Quit date: 06/05/1999    Years since quitting: 21.7   Smokeless tobacco: Never  Vaping Use   Vaping Use: Never used  Substance and Sexual Activity   Alcohol use: No   Drug use: No   Sexual activity: Not on file  Other Topics Concern   Not on file  Social History Narrative   Not on file   Social Determinants of Health   Financial Resource Strain: Not on file  Food Insecurity: Not on file  Transportation Needs: Not on file  Physical Activity: Not on file  Stress: Not on file  Social Connections: Not on file  Intimate Partner Violence: Not on file     Review of Systems: All other systems reviewed and are otherwise negative except as noted above.  Physical Exam: Vitals:   03/04/21 1055  BP: 128/74  Pulse: 70  SpO2: 95%  Weight: 254 lb (115.2 kg)  Height: 5' 5.5" (1.664 m)    GEN- The patient is well appearing, alert and oriented x 3 today.   HEENT: normocephalic, atraumatic; sclera clear, conjunctiva pink; hearing intact; oropharynx clear; neck supple, no JVP Lymph- no cervical lymphadenopathy Lungs- Clear to ausculation bilaterally, normal work of breathing.  No wheezes, rales, rhonchi Heart- Regular rate and rhythm, no murmurs, rubs or gallops, PMI not laterally displaced GI- soft, non-tender, non-distended, bowel sounds present, no hepatosplenomegaly Extremities- no clubbing, cyanosis, or edema; DP/PT/radial pulses 2+ bilaterally MS- no significant deformity or atrophy Skin- warm and dry, no rash or lesion Psych- euthymic mood, full affect Neuro- strength and sensation are intact  EKG is not ordered.   Additional studies reviewed include: Previous EP office notes.   Assessment and Plan:  1.  PVCs:  Stable to  improved on recent monitor    2.  Chronic diastolic heart failure:  NYHA III symptoms Continue lasix 60 mg daily Continue spiro 25 mg daily Labs today.  Drinking lots of fluid. Encouraged her to limit sodium and fluid to < 2000 mg and 2000 mL, respectively.  Update Echo.    3. HTN Stable on current regimen   4. Mild chest discomfort Accompanied by her SOB.  She had mild non-obstructive CAD years ago. With shared decision making will proceed first with Echo, and if consistent with chronic diastolic CHF, will work on fluid.  If symptoms worsen or become more typical, will address at that time.   Shirley Friar, PA-C  03/04/21 11:11 AM

## 2021-03-04 ENCOUNTER — Ambulatory Visit (INDEPENDENT_AMBULATORY_CARE_PROVIDER_SITE_OTHER): Payer: Medicare HMO | Admitting: Student

## 2021-03-04 ENCOUNTER — Other Ambulatory Visit: Payer: Self-pay

## 2021-03-04 ENCOUNTER — Encounter: Payer: Self-pay | Admitting: Student

## 2021-03-04 ENCOUNTER — Other Ambulatory Visit: Payer: Self-pay | Admitting: *Deleted

## 2021-03-04 VITALS — BP 128/74 | HR 70 | Ht 65.5 in | Wt 254.0 lb

## 2021-03-04 DIAGNOSIS — D0512 Intraductal carcinoma in situ of left breast: Secondary | ICD-10-CM

## 2021-03-04 DIAGNOSIS — I493 Ventricular premature depolarization: Secondary | ICD-10-CM

## 2021-03-04 DIAGNOSIS — I5032 Chronic diastolic (congestive) heart failure: Secondary | ICD-10-CM | POA: Diagnosis not present

## 2021-03-04 DIAGNOSIS — I10 Essential (primary) hypertension: Secondary | ICD-10-CM | POA: Diagnosis not present

## 2021-03-04 LAB — BASIC METABOLIC PANEL
BUN/Creatinine Ratio: 26 (ref 12–28)
BUN: 15 mg/dL (ref 8–27)
CO2: 27 mmol/L (ref 20–29)
Calcium: 9.5 mg/dL (ref 8.7–10.3)
Chloride: 104 mmol/L (ref 96–106)
Creatinine, Ser: 0.57 mg/dL (ref 0.57–1.00)
Glucose: 105 mg/dL — ABNORMAL HIGH (ref 70–99)
Potassium: 4.9 mmol/L (ref 3.5–5.2)
Sodium: 144 mmol/L (ref 134–144)
eGFR: 95 mL/min/{1.73_m2} (ref >59)

## 2021-03-04 NOTE — Patient Instructions (Signed)
Medication Instructions:  Your physician recommends that you continue on your current medications as directed. Please refer to the Current Medication list given to you today.  *If you need a refill on your cardiac medications before your next appointment, please call your pharmacy*   Lab Work: TODAY: BMET  If you have labs (blood work) drawn today and your tests are completely normal, you will receive your results only by: [place] (if you have MyChart) OR A paper copy in the mail If you have any lab test that is abnormal or we need to change your treatment, we will call you to review the results.   Testing/Procedures: Your physician has requested that you have an echocardiogram. Echocardiography is a painless test that uses sound waves to create images of your heart. It provides your doctor with information about the size and shape of your heart and how well your heart's chambers and valves are working. This procedure takes approximately one hour. There are no restrictions for this procedure.   Follow-Up: At [facility], you and your health needs are our priority.  As part of our continuing mission to provide you with exceptional heart care, we have created designated Provider Care Teams.  These Care Teams include your primary Cardiologist (physician) and Advanced Practice Providers (APPs -  Physician Assistants and Nurse Practitioners) who all work together to provide you with the care you need, when you need it.  Your next appointment:   6 month(s)  The format for your next appointment:   In Person  Provider:   You may see Will Meredith Leeds, MD or one of the following Advanced Practice Providers on your designated Care Team:   Tommye Standard, [place] Legrand Como "[facility]" [place], [place]

## 2021-03-04 NOTE — Progress Notes (Signed)
[place]  [phone] [phone]     ID: [name] DOB: Sep 10, 1945  MR#: [id number]  HYQ#:[id number]  Patient Care Team: Chesley Noon, MD as PCP - General (Family Medicine) [name], MD as Attending Physician (Gynecologic Oncology) Yolonda Purtle, Virgie Dad, MD as Consulting Physician (Oncology) Delsa Bern, MD as Consulting Physician (Obstetrics and Gynecology) Sanda Klein, MD as Consulting Physician (Cardiology) Coralie Keens, MD as Consulting Physician (General Surgery) Lafonda Mosses, MD as Consulting Physician (Gynecologic Oncology) Chauncey Cruel, MD OTHER MD:  CHIEF COMPLAINT: 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 She was re-evaluated in the breast cancer clinic on 05/02/2020. She continues under observation.  Since her last visit, she underwent genetic testing on 05/10/2020. Results were negative.  She gets her mammograms done at Dr. Boyd Kerbs office.  She tells me her most recent mammogram was in May of this year and that it was "fine".  She also said she had a bone density which was "improved".   REVIEW OF SYSTEMS: [name] is very stable.  She is not exercising.  She sees multiple doctors including heart, electrophysiology, surgery, gynecology, and of course of primary care and she sees [place] on a once a year basis as well.   COVID 19 VACCINATION STATUS: Status post Pfizer x2+ booster November 2021, has not had the disease so far   HISTORY OF CURRENT ILLNESS: From the original intake note:  [name] has a remote history of right-sided breast cancer, status post modified radical mastectomy in 1997 for a stage II tumor, for which she received adjuvant chemotherapy and tamoxifen for six years. Subsequently in 03/28/2004 she underwent left  excisional biopsy for ductal carcinoma in situ, grade 2, measuring 2 cm, with negative margins, estrogen and progesterone receptor positive. She was treated with letrozole after that point, again for six years  She also has a history of Paget's disease of the vulva followed by Dr. Alycia Rossetti at [facility].  Her most recent biopsy November 2021 was still positive and she is treating this with local agents  The patient's subsequent history is as detailed below.   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   Cancer of right breast, stage 2 ([place]) 05/28/2011   2.8 cm invasive 1 node pos ER pos S/P mastectomy 06/1995 then AC x 4 then Tam X 5    Chest pain, rule out acute myocardial infarction 04/10/2017   Chronic constipation    Chronic cystitis 04/09/2017   Cough 08/23/2007   Qualifier: Diagnosis of  By: Gwenette Greet MD, Armando Reichert    Depression    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   Dysrhythmia 2020   Had Afib   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   Fibromyalgia    GERD (gastroesophageal reflux disease)    History of adenomatous polyp of colon 09/30/2018   History of breast cancer  no recurrence   1997  right breast cancer  s/p  mastectomy w/ snl dissection and chemoradiation/  2005  left breast cancer DCIS  s/p  lumpectomy and radiation   History of colon polyps    2007 hyperplagia   History of esophageal dilatation    2008   History of peptic ulcer    History of small bowel obstruction    2012   Hypothyroid 05/28/2011   Hypothyroidism    Incomplete emptying of bladder 07/30/2016    Left foot pain 12/11/2015   Overview:  Dr Cyril Mour[facility]   Macular degeneration of right eye    Mild depression 05/28/2011   Mixed incontinence urge and stress 09/16/2011   S/p lumax desires surgery    Morbid obesity ([place]) 04/10/2011   Numbness of left foot    4 toes   Osteoporosis 12/15/2016   Overview:  Dr Cletis Media   Paget's disease of vulva [facility])    Personal history of chemotherapy 1997   Personal history of radiation therapy 2005   PONV (postoperative nausea and vomiting)    and HARD TO WAKE   Seasonal allergies    Shortness of breath    SKIN CANCER, HX OF 08/23/2007   Qualifier: Diagnosis of  By: Doy Mince LPN, Megan     SUI (stress urinary incontinence, female)    Thrombocytopenic disorder ([place]) 02/25/2017   Wears glasses     PAST SURGICAL HISTORY: Past Surgical History:  Procedure Laterality Date   BLADDER SUSPENSION  10/13/2011   Procedure: TRANSVAGINAL TAPE (TVT) PROCEDURE;  Surgeon: Delice Lesch, MD;  Location: [facility];  Service: Gynecology;  Laterality: N/A;   BREAST BIOPSY Left 03/19/2004   BREAST LUMPECTOMY Left 03-27-2004   CARDIAC CATHETERIZATION  10-30-2000  dr Wynonia Lawman   normal coronary arteries and LVF/  ef 65-70%   CLOSED MANIPULATION  W/ OPEN REDUCTION EXCHANGE TIBIAL FEMORAL BEARING POST TOTAL LEFT KNEE  04-14-2001   CYSTOSCOPY  10/13/2011   Procedure: CYSTOSCOPY;  Surgeon: Delice Lesch, MD;  Location: [facility];  Service: Gynecology;  Laterality: Bilateral;   DIAGNOSTIC LAPAROSCOPY     Colon d/t SBO   DILATION AND CURETTAGE OF UTERUS  1968   FOOT SURGERY Left 02-01-2009   TRIPLE ARTHRODESIS   FOOT SURGERY Left 11/14   I&D left foot with woundvac placement   INCISIONAL HERNIA REPAIR  05/05/2011   Procedure: LAPAROSCOPIC INCISIONAL HERNIA;  Surgeon: Edward Jolly, MD;  Location: [facility];  Service: General;  Laterality: N/A;  LAPAROSCOPIC REPAIR INCARCERATED INCISIONAL HERNIA  WITH MESH   KNEE ARTHROSCOPY Bilateral left 01-1984 & 04-1992/   right [id number]    LAPAROSCOPIC CHOLECYSTECTOMY  06-21-2010   LEFT HEART CATH AND CORONARY ANGIOGRAPHY N/A 04/13/2017   Procedure: LEFT HEART CATH AND CORONARY ANGIOGRAPHY;  Surgeon: Jettie Booze, MD;  Location: [facility];  Service: Cardiovascular;  Laterality: N/A;   LUMBAR SPINE SURGERY  1998   L5 -- S1   MASTECTOMY Right 1997   W/  LYMPH NODE DISSECTION AND RECONSTRUCTION WITH IMPLANTS AND EXPANDER   NEGATIVE SLEEP STUDY  12/26/2015   PORT-A-CATH PLACEMENT AND REMOVAL  1997   REMOVAL RIGHT BREAST IMPLANT AND CAPSULECTOMY  06-03-1999   REVERSE SHOULDER ARTHROPLASTY Left 10/25/2019   Procedure: LEFT REVERSE SHOULDER ARTHROPLASTY;  Surgeon: Meredith Pel, MD;  Location: [facility];  Service: Orthopedics;  Laterality: Left;   REVISION TOTAL KNEE ARTHROPLASTY Left 07-07-2005  &  12-10-2001   12-10-2001  REMOVAL TOTAL KNEE/ I&D /  INSERTION ANTIBIOTIC SPACER   REVISION TOTAL KNEE ARTHROPLASTY Right 07-07-2005   ROTATOR CUFF REPAIR Right    TENDON REPAIR RIGHT RING FINGER  2011   THUMB TRIGGER RELEASE Right 1993   THYROIDECTOMY, PARTIAL  1983   TONSILLECTOMY  1968   TOTAL KNEE ARTHROPLASTY Bilateral right 05-04-2000/  left 03-29-2001   VULVECTOMY  05/27/2012   Procedure: WIDE EXCISION VULVECTOMY;  Surgeon: Imagene Gurney A. Alycia Rossetti, MD;  Location: [facility];  Service: Gynecology;  Laterality: N/A;  Wide Local Excision of Vulva    VULVECTOMY N/A 11/23/2012   Procedure: WIDE LOCAL EXCISION OF THE VULVA;  Surgeon: Imagene Gurney A. Alycia Rossetti, MD;  Location: [facility];  Service: Gynecology;  Laterality: N/A;  left inferior vulvar biopsy excision left superior lesion left inferior vulvar excision   VULVECTOMY N/A 08/02/2013   Procedure: WIDE LOCAL  EXCISION VULVA;  Surgeon: Imagene Gurney A. Alycia Rossetti, MD;  Location: [facility];  Service: Gynecology;  Laterality: N/A;   VULVECTOMY Left 03/29/2014   Procedure: WIDE LOCAL EXCISION OF LEFT VULVA ;  Surgeon: Imagene Gurney A. Alycia Rossetti, MD;  Location: [facility];  Service: Gynecology;   Laterality: Left;   VULVECTOMY Bilateral 01/24/2015   Procedure: WIDE LOCAL EXCISION VULVA;  Surgeon: [name], MD;  Location: [facility];  Service: Gynecology;  Laterality: Bilateral;   VULVECTOMY PARTIAL  07-16-2010    FAMILY HISTORY: Family History  Problem Relation Age of Onset   Kidney cancer Father 50       metastatic    Alzheimer's disease Mother    Other Mother        ? Cancer   Heart attack Maternal Grandmother    Alcohol abuse Brother    Bladder Cancer Brother        dx 50s   Breast cancer Paternal Aunt        dx after 20   Bladder Cancer Paternal Aunt        dx after 2   Breast cancer Cousin        two paternal female cousins; unknown age of diagnosis   Cancer Maternal Aunt        ? gallbladder; dx 59s   Cancer Maternal Uncle        unknown type; ? pancreatic ? colon; dx after 30   Brain cancer Paternal Uncle        unknown age of dx   Melanoma Brother        dx late 21s-early 60s   Bone cancer Maternal Uncle        dx after 63   Leukemia Maternal Uncle        dx after 78   Bone cancer Maternal Uncle        dx after 77   Lung cancer Maternal Uncle        dx after 73   Brain cancer Cousin        two maternal female cousins; dx after 69   Stomach cancer Paternal Aunt        unknown age of dx  The patient's father had a history of lung cancer metastatic to the kidneys. He died at age 43. The patient's mother had Alzheimer's disease and died at age 81. The patient had 3 brothers, 2 sisters. One brother has been diagnosed with prostate cancer at age 75. There is also: Lung and brain cancers in cousins and prostate cancer in a paternal uncle.   GYNECOLOGIC HISTORY:  No LMP recorded. Patient is postmenopausal. Menarche:  75 years old Age at first live birth: 75 years old [name] P 1 (stillborn) LMP 1997, with chemotherapy Contraceptive: remote use without complications Hysterectomy? no BSO? 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 Husband Ronalee Belts is retired from working in Engineer, materials.     ADVANCED DIRECTIVES: In the absence of any documentation to the contrary, the patient's spouse is their HCPOA.    HEALTH MAINTENANCE: Social History   Tobacco Use   Smoking status: Former    Packs/day: 2.00    Years: 41.00    Pack years: 82.00    Types: Cigarettes    Quit date: 06/05/1999    Years since quitting: 21.7   Smokeless tobacco: Never  Vaping Use   Vaping Use: Never used  Substance Use Topics   Alcohol use: No   Drug use: No     Colonoscopy: [facility]  PAP: [facility]  Bone density: [facility]ard   Allergies  Allergen Reactions   Tape     "thick clear plastic tape" causes blisters    Hydromorphone Hcl Itching and Rash   Macrobid [Nitrofurantoin] Rash   Morphine And Related Itching and Rash   Penicillins Hives and Swelling    1968   Valisone [Betamethasone] Rash    Current Outpatient Medications  Medication Sig Dispense Refill   acetaminophen (TYLENOL) 500 MG tablet Take 1,000 mg by mouth every 6 (six) hours as needed for mild pain or moderate pain.     albuterol (PROVENTIL HFA;VENTOLIN HFA) 108 (90 BASE) MCG/ACT inhaler Inhale 2 puffs into the lungs every 4 (four) hours as needed for wheezing or shortness of breath. Reported on 08/01/2015     aspirin EC 81 MG EC tablet Take 1 tablet (81 mg total) daily by mouth. 30 tablet 0   atorvastatin (LIPITOR) 40 MG tablet Take 1 tablet (40 mg total) daily at 6 PM by mouth. 30 tablet 0   bisoprolol (ZEBETA) 5 MG tablet TAKE 1 TABLET BY MOUTH EVERY DAY 90 tablet 3   budesonide-formoterol (SYMBICORT) 160-4.5 MCG/ACT inhaler Inhale 2 puffs into the lungs daily as needed (Wheezing in the winter).     buPROPion (WELLBUTRIN XL) 150 MG 24 hr tablet Take 1 tablet by mouth every morning.     celecoxib (CELEBREX) 200 MG capsule Take 1 capsule (200 mg total) by mouth daily. 30 capsule 0   Cholecalciferol (VITAMIN D3) 1000 UNITS CAPS Take 2,000  Units by mouth daily.      citalopram (CELEXA) 40 MG tablet Take 1 tablet by mouth daily.     clindamycin (CLEOCIN) 150 MG capsule Take 150 mg by mouth 3 (three) times daily. Only foe dental visits     furosemide (LASIX) 40 MG tablet Take 1.5 tablets (60 mg total) by mouth daily. Take extra 1/2 tablet as needed for weight gain. 150 tablet 3   HYDROcodone-acetaminophen (NORCO/VICODIN) 5-325 MG tablet Take 1 tablet by mouth every 8 (eight) hours as needed for moderate pain. (Patient not taking: Reported on 03/04/2021) 30 tablet 0   hydrOXYzine (VISTARIL) 25 MG capsule Take 25 mg by mouth 3 (three) times daily as needed for itching.     ibandronate (BONIVA) 150 MG tablet Take 150 mg by mouth every 30 (thirty) days. Take in the morning with a full glass of water, on an empty stomach, and do not take anything else by mouth or lie down for the next 30 min.     ipratropium-albuterol (DUONEB) 0.5-2.5 (3)  MG/3ML SOLN Take 3 mLs by nebulization every 4 (four) hours as needed (Wheezing).     ketoconazole (NIZORAL) 2 % shampoo Apply 1 application topically every 14 (fourteen) days.     levothyroxine (SYNTHROID) 200 MCG tablet Take 200 mcg by mouth daily before breakfast.      methocarbamol (ROBAXIN) 500 MG tablet TAKE 1 TABLET BY MOUTH EVERY 8 HOURS AS NEEDED (Patient not taking: Reported on 03/04/2021) 30 tablet 0   mexiletine (MEXITIL) 150 MG capsule TAKE 1 CAPSULE (150 MG TOTAL) BY MOUTH 3 (THREE) TIMES DAILY. 90 capsule 2   montelukast (SINGULAIR) 10 MG tablet Take 10 mg by mouth daily.     Multiple Vitamin (MULTIVITAMIN WITH MINERALS) TABS tablet Take 1 tablet daily by mouth.     nystatin (MYCOSTATIN/NYSTOP) powder Apply topically 4 (four) times daily. 60 g 6   nystatin cream (MYCOSTATIN) Apply 1 application topically 2 (two) times daily. 30 g 6   pantoprazole (PROTONIX) 40 MG tablet Take 40 mg by mouth daily.     polyvinyl alcohol (LIQUIFILM TEARS) 1.4 % ophthalmic solution Place 1 drop into both eyes  daily as needed for dry eyes.      spironolactone (ALDACTONE) 25 MG tablet Take 1 tablet (25 mg total) by mouth daily. 90 tablet 3   No current facility-administered medications for this visit.    OBJECTIVE: White woman who appears older than stated age  31:   03/05/21 0916  BP: (!) 155/50  Pulse: 67  Resp: 18  Temp: 97.6 F (36.4 C)  SpO2: 96%      Body mass index is 42.2 kg/m.   Wt Readings from Last 3 Encounters:  03/05/21 253 lb 9.6 oz (115 kg)  03/04/21 254 lb (115.2 kg)  01/29/21 255 lb 4.8 oz (115.8 kg)      ECOG FS:2 - Symptomatic, <50% confined to bed  Sclerae unicteric, EOMs intact Wearing a mask No cervical or supraclavicular adenopathy Lungs no rales or rhonchi Heart regular rate and rhythm Abd soft, obese, nontender, positive bowel sounds MSK no focal spinal tenderness, no upper extremity lymphedema; had great difficulty climbing onto the examination table Neuro: nonfocal, well oriented, pleasant affect Breasts: Status post right mastectomy.  There is no evidence of chest wall recurrence.  There is mild tenderness to palpation which is not new.  The left breast is status postlumpectomy with no evidence of local recurrence.  Both axillae are benign.   LAB RESULTS:  CMP     Component Value Date/Time   NA 142 03/05/2021 0859   NA 144 03/04/2021 1141   NA 143 10/23/2015 1542   K 4.9 03/05/2021 0859   K 4.7 10/23/2015 1542   CL 105 03/05/2021 0859   CO2 29 03/05/2021 0859   CO2 28 10/23/2015 1542   GLUCOSE 110 (H) 03/05/2021 0859   GLUCOSE 97 10/23/2015 1542   BUN 14 03/05/2021 0859   BUN 15 03/04/2021 1141   BUN 12.2 10/23/2015 1542   CREATININE 0.73 03/05/2021 0859   CREATININE 0.7 10/23/2015 1542   CALCIUM 9.5 03/05/2021 0859   CALCIUM 9.7 10/23/2015 1542   PROT 6.6 03/05/2021 0859   PROT 6.6 10/23/2015 1542   ALBUMIN 4.0 03/05/2021 0859   ALBUMIN 3.9 10/23/2015 1542   AST 19 03/05/2021 0859   AST 17 10/23/2015 1542   ALT 15 03/05/2021  0859   ALT 14 10/23/2015 1542   ALKPHOS 85 03/05/2021 0859   ALKPHOS 85 10/23/2015 1542   BILITOT 0.6 03/05/2021 0859  BILITOT 0.32 10/23/2015 1542   GFRNONAA >60 03/05/2021 0859   GFRAA >60 10/21/2019 1133    No results found for: TOTALPROTELP, ALBUMINELP, A1GS, A2GS, BETS, BETA2SER, GAMS, MSPIKE, SPEI  Lab Results  Component Value Date   WBC 4.7 03/05/2021   NEUTROABS 3.1 03/05/2021   HGB 12.9 03/05/2021   HCT 39.8 03/05/2021   MCV 87.7 03/05/2021   PLT 122 (L) 03/05/2021    No results found for: LABCA2  No components found for: DJMEQA834  No results for input(s): INR in the last 168 hours.  No results found for: LABCA2  No results found for: HDQ222  No results found for: LNL892  No results found for: JJH417  No results found for: CA2729  No components found for: HGQUANT  No results found for: CEA1 / No results found for: CEA1   No results found for: AFPTUMOR  No results found for: CHROMOGRNA  No results found for: KPAFRELGTCHN, LAMBDASER, KAPLAMBRATIO (kappa/lambda light chains)  No results found for: HGBA, HGBA2QUANT, HGBFQUANT, HGBSQUAN (Hemoglobinopathy evaluation)   Lab Results  Component Value Date   LDH 235 09/08/2011    No results found for: IRON, TIBC, IRONPCTSAT (Iron and TIBC)  No results found for: FERRITIN  Urinalysis    Component Value Date/Time   COLORURINE STRAW (A) 10/21/2019 1200   APPEARANCEUR CLEAR 10/21/2019 1200   LABSPEC 1.006 10/21/2019 1200   LABSPEC 1.010 06/15/2013 1534   PHURINE 7.0 10/21/2019 1200   GLUCOSEU NEGATIVE 10/21/2019 1200   GLUCOSEU Negative 06/15/2013 1534   HGBUR SMALL (A) 10/21/2019 1200   BILIRUBINUR NEGATIVE 10/21/2019 1200   BILIRUBINUR Negative 06/15/2013 1534   KETONESUR NEGATIVE 10/21/2019 1200   PROTEINUR NEGATIVE 10/21/2019 1200   UROBILINOGEN 0.2 06/15/2013 1534   NITRITE NEGATIVE 10/21/2019 1200   LEUKOCYTESUR NEGATIVE 10/21/2019 1200   LEUKOCYTESUR Small 06/15/2013 1534     STUDIES: No results found.   ELIGIBLE FOR AVAILABLE RESEARCH PROTOCOL: no  ASSESSMENT: 75 y.o. [employer] woman   (1) status post right mastectomy and axillary lymph node dissection in 1997 for a stage II, estrogen receptor positive invasive breast cancer, treated adjuvantly with chemotherapy (agents not retrievable), followed by tamoxifen for 6 years  (2) status post left excisional biopsy 03/28/2004 for ductal carcinoma in situ, grade 2, measuring 2 cm, with negative margins, estrogen and progesterone receptor positive. She received letrozole x6 years after this.  (3) greater than 40-pack-year smoking history, the patient quitting in 2001  (4) genetic testing 05/18/2020 through the [facility] found no deleterious mutations in AIP, ALK, APC, ATM, AXIN2,BAP1,  BARD1, BLM, BMPR1A, BRCA1, BRCA2, BRIP1, CASR, CDC73, CDH1, CDK4, CDKN1B, CDKN1C, CDKN2A (p14ARF), CDKN2A (p16INK4a), CEBPA, CHEK2, CTNNA1, DICER1, DIS3L2, EGFR (c.2369C>T, p.Thr790Met variant only), EPCAM (Deletion/duplication testing only), FH, FLCN, GATA2, GPC3, GREM1 (Promoter region deletion/duplication testing only), HOXB13 (c.251G>A, p.Gly84Glu), HRAS, KIT, MAX, MEN1, MET, MITF (c.952G>A, p.Glu318Lys variant only), MLH1, MSH2, MSH3, MSH6, MUTYH, NBN, NF1, NF2, NTHL1, PALB2, PDGFRA, PHOX2B, PMS2, POLD1, POLE, POT1, PRKAR1A, PTCH1, PTEN, RAD50, RAD51C, RAD51D, RB1, RECQL4, RET, RNF43, RUNX1, SDHAF2, SDHA (sequence changes only), SDHB, SDHC, SDHD, SMAD4, SMARCA4, SMARCB1, SMARCE1, STK11, SUFU, TERC, TERT, TMEM127, TP53, TSC1, TSC2, VHL, WRN and WT1.  (A) Variant of uncertain significance detected in [facility] at c.295T>C (p.Tyr99His).  PLAN: [name] is now 25 years out from definitive surgery for her invasive breast cancer and 17 years out from her noninvasive breast cancer with no evidence of disease recurrence.  This is very favorable.  I am very comfortable releasing  her to her primary care physicians.  She gets  her mammography through Dr. Cletis Media and Dr. Cletis Media also does a breast exam and that really is almost needs at this point.  Nevertheless she is interested in participating in our survivorship program and I have scheduled her to see our nurse practitioner June 2023.  I have encouraged [name] to consider joining the [employer] or some other water aerobics or similar program.  I am afraid she is becoming very deconditioned and had a great deal of difficulty even climbing on the examination table today  I gave her a copy of her genetics results for her records.  Luckily they were benign.  She also understands that while she has mild chronic thrombocytopenia this is of no concern and she may take aspirin or other nonsteroidals as needed with  Total encounter time 20 minute  Sarajane Jews C. Kimball Appleby, MD 03/05/2021 9:51 AM Medical Oncology and Hematology [facility] [place], [place] [address] Tel. [phone]    Fax. [phone]   This document serves as a record of services personally performed by Lurline Del, MD. It was created on his behalf by Wilburn Mylar, a trained medical scribe. The creation of this record is based on the scribe's personal observations and the provider's statements to them.   I, Lurline Del MD, have reviewed the above documentation for accuracy and completeness, and I agree with the above.   *Total Encounter Time as defined by the Centers for Medicare and Medicaid Services includes, in addition to the face-to-face time of a patient visit (documented in the note above) non-face-to-face time: obtaining and reviewing outside history, ordering and reviewing medications, tests or procedures, care coordination (communications with other health care professionals or caregivers) and documentation in the medical record.

## 2021-03-05 ENCOUNTER — Inpatient Hospital Stay (HOSPITAL_BASED_OUTPATIENT_CLINIC_OR_DEPARTMENT_OTHER): Payer: Medicare HMO | Admitting: Oncology

## 2021-03-05 ENCOUNTER — Inpatient Hospital Stay: Payer: Medicare HMO | Attending: Gynecologic Oncology

## 2021-03-05 VITALS — BP 155/50 | HR 67 | Temp 97.6°F | Resp 18 | Ht 65.0 in | Wt 253.6 lb

## 2021-03-05 DIAGNOSIS — C50911 Malignant neoplasm of unspecified site of right female breast: Secondary | ICD-10-CM

## 2021-03-05 DIAGNOSIS — D0512 Intraductal carcinoma in situ of left breast: Secondary | ICD-10-CM | POA: Diagnosis not present

## 2021-03-05 DIAGNOSIS — D696 Thrombocytopenia, unspecified: Secondary | ICD-10-CM | POA: Diagnosis not present

## 2021-03-05 DIAGNOSIS — Z87891 Personal history of nicotine dependence: Secondary | ICD-10-CM | POA: Insufficient documentation

## 2021-03-05 DIAGNOSIS — Z86000 Personal history of in-situ neoplasm of breast: Secondary | ICD-10-CM | POA: Diagnosis not present

## 2021-03-05 DIAGNOSIS — Z9221 Personal history of antineoplastic chemotherapy: Secondary | ICD-10-CM | POA: Insufficient documentation

## 2021-03-05 DIAGNOSIS — Z853 Personal history of malignant neoplasm of breast: Secondary | ICD-10-CM | POA: Insufficient documentation

## 2021-03-05 LAB — CMP (CANCER CENTER ONLY)
ALT: 15 U/L (ref 0–44)
AST: 19 U/L (ref 15–41)
Albumin: 4 g/dL (ref 3.5–5.0)
Alkaline Phosphatase: 85 U/L (ref 38–126)
Anion gap: 8 (ref 5–15)
BUN: 14 mg/dL (ref 8–23)
CO2: 29 mmol/L (ref 22–32)
Calcium: 9.5 mg/dL (ref 8.9–10.3)
Chloride: 105 mmol/L (ref 98–111)
Creatinine: 0.73 mg/dL (ref 0.44–1.00)
GFR, Estimated: >60 mL/min (ref >60)
Glucose, Bld: 110 mg/dL — ABNORMAL HIGH (ref 70–99)
Potassium: 4.9 mmol/L (ref 3.5–5.1)
Sodium: 142 mmol/L (ref 135–145)
Total Bilirubin: 0.6 mg/dL (ref 0.3–1.2)
Total Protein: 6.6 g/dL (ref 6.5–8.1)

## 2021-03-05 LAB — CBC WITH DIFFERENTIAL (CANCER CENTER ONLY)
Abs Immature Granulocytes: 0.02 10*3/uL (ref 0.00–0.07)
Basophils Absolute: 0 10*3/uL (ref 0.0–0.1)
Basophils Relative: 1 %
Eosinophils Absolute: 0.1 10*3/uL (ref 0.0–0.5)
Eosinophils Relative: 2 %
HCT: 39.8 % (ref 36.0–46.0)
Hemoglobin: 12.9 g/dL (ref 12.0–15.0)
Immature Granulocytes: 0 %
Lymphocytes Relative: 24 %
Lymphs Abs: 1.1 10*3/uL (ref 0.7–4.0)
MCH: 28.4 pg (ref 26.0–34.0)
MCHC: 32.4 g/dL (ref 30.0–36.0)
MCV: 87.7 fL (ref 80.0–100.0)
Monocytes Absolute: 0.3 10*3/uL (ref 0.1–1.0)
Monocytes Relative: 7 %
Neutro Abs: 3.1 10*3/uL (ref 1.7–7.7)
Neutrophils Relative %: 66 %
Platelet Count: 122 10*3/uL — ABNORMAL LOW (ref 150–400)
RBC: 4.54 MIL/uL (ref 3.87–5.11)
RDW: 14.2 % (ref 11.5–15.5)
WBC Count: 4.7 10*3/uL (ref 4.0–10.5)
nRBC: 0 % (ref 0.0–0.2)

## 2021-03-19 ENCOUNTER — Other Ambulatory Visit: Payer: Self-pay

## 2021-03-19 ENCOUNTER — Ambulatory Visit (HOSPITAL_COMMUNITY): Payer: Medicare HMO | Attending: Internal Medicine

## 2021-03-19 DIAGNOSIS — I5032 Chronic diastolic (congestive) heart failure: Secondary | ICD-10-CM | POA: Insufficient documentation

## 2021-03-19 LAB — ECHOCARDIOGRAM COMPLETE
Area-P 1/2: 2.31 cm2
S' Lateral: 3.8 cm

## 2021-04-05 ENCOUNTER — Other Ambulatory Visit: Payer: Self-pay | Admitting: Cardiology

## 2021-05-02 ENCOUNTER — Encounter: Payer: Self-pay | Admitting: Gynecologic Oncology

## 2021-05-02 NOTE — Progress Notes (Signed)
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 Some of the surgical margins were positive. Her postoperative course was uncomplicated. She was last seen by our service in 12/14. At that time there was no evidence of any lesions consistent with Paget's. However, there is considerable amount of excoriations throughout the entire vulvar and medial thighs consistent with chronic vulvitis.In December 2013 she had a hernia repair due to bowel obstruction by Dr. Excell Seltzer with permanent mesh. In May 2013 she also had a TVT bladder by Dr. Everett Graff.    She underwent a wide local excision of the left vulva on January 2 of year 2014. Operative findings included 2 separate 3 x 3 and 1.2 cm lesions consistent with Paget's on the left.  Pathology revealed 2 foci of extramammary Paget's disease extending to the left inked margin in the right and anterior tip margins at multiple foci. The posterior margin was negative for malignancy.    On 11/23/2012 showed a repeat wide local excision of vulva x2 with the posterior fourchette biopsy. Findings revealed along the left labia minora/vagina is a Paget's disease measuring approximately 2.5 x 1 cm in the superior aspect of her prior left vulvar excision. The inferior aspect of a similar lesion. Biopsy and excision was performed in this area showing extramammary paget disease in all sites.   She had been seen in  January of 2015. At that time exam was concerning for Paget's.    On 08/02/2013 she underwent wide local excisions of the vulva x2. Operative findings included pale pink raised lesion measuring 1 x 0.5 cm the left upper vulva near the prior excision site. There is a pale raised lesion measuring 1.5 x 1 cm on the left crossing midline perineal body. Pathology revealed left superior vulvar excision with extramammary Paget's disease extending to the 9:00 margin. The other margins were negative for tumor. Of the left of midline lesion she extramammary Paget's disease extending to the 12:00 margin and focally at the 6:00 margin but otherwise negative margins.    She was seen on March 16, 2014 at which time there was an area on the left vulva with hyperkeratosis. A biopsy of that area was performed consistent with Paget's disease. On March 29, 2014 she underwent wide local excision of the vulva. On exam she had a 1 cm left vulvar lesion just below the left labia minora. She underwent a wide local excision. He received a 3.7 x 1.7 x 0.3 cm lesion. There was extramammary Paget's disease extending to the 6 to 9:00 margin in the 12:00 margin. This is despite grossly negative margins on physical examination.    She had an excision in November, 2015 that revealed extramammary pagets disease, extending to 6-9 o'clock and 12 o'clock margins.    She was seen in February 2016 at which time there was concern for recurrent disease. She decided to  proceed with Aldara therapy. She used for approximate 4 weeks and then decided to stop it secondary to the expense of the medication. She then started using the clobetasol. While she was using the Aldara all the pruritus in her vulva resolved itself. Since she stopped it and was using the steroid cream the pruritus returned.   She was seen in May 2016 at which time she has small area consistent with Paget's on the right labia minora and a larger patch on the left. Biopsy on the  right revealed extramammary Paget's. After discussion she wished to try the Aldara again.    She was seen December 27, 2014 at which time there was evidence clearly a Paget's disease and she scheduled for surgery. On January 24, 2015 she underwent bilateral wide local excisions of the vulva. On the right vulva there was a 2 cm patch of Paget's disease in the mid labia majora. On the left side. The remaining labia minora in the area prior to the excision are smaller lesion consistent with Paget's disease. Pathology confirmed pagets with positive margins.    The patient was taken to the operating room on 01/31/2016 for preoperative mapping of her lesion for her planned wide local excision of the left vulva and smaller excision on the right. Operative findings at that time were notable for surgically absent inferior aspect of the left labium minora and majora. Multiple areas with white flaky skin changes were biopsied and the abnormal-appearing tissue. Changes noted in the area of the clitoris and perineum and at the introitus just distal to the hymen. Biopsies were collected on all of the sites. Pagets disease was confirmed with positive margins.    She was evaluated in September, 2020 and biopsy of the vulva confirmed recurrent Pagets disease. She underwent bilateral simple partial vulvectomy on 03/04/19 with plastic surgery reconstruction (gracilis flap). Pathology confirmed recurrent pagets with positive margins bilaterally.    In June, 2021 she had a vulvar biopsy positive for pagets which was treated with Imiquimod by Dr Alycia Rossetti at [facility].  Interval History: Presents today for follow-up.  Notes about a week of vulvar pruritus along her right labia.  Has been using nystatin powder as needed, which was helped some with the symptoms.  She also switched soaps again recently.  She had originally use Dial, switch to [employer], and is now back to using Dial soap.  She denies any vaginal bleeding or  discharge.  Has occasional pain in bilateral groins, at baseline.  Also continues to have occasional pelvic/abdominal pain that seems to radiate from bilateral hip areas.  She struggles with constipation, is using Dulcolax and MiraLAX with good relief.  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   Cancer of right breast, stage 2 ([place]) 05/28/2011   2.8 cm invasive 1 node pos ER pos S/P mastectomy 06/1995 then AC x 4 then Tam X 5    Chest pain, rule out acute myocardial infarction 04/10/2017   Chronic constipation    Chronic cystitis 04/09/2017   Cough 08/23/2007   Qualifier: Diagnosis of  By: Gwenette Greet MD,  Armando Reichert    Depression    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   Dysrhythmia 2020   Had Afib   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   Fibromyalgia    GERD (gastroesophageal reflux disease)    History of adenomatous polyp of colon 09/30/2018   History of breast cancer no recurrence   1997  right breast cancer  s/p  mastectomy w/ snl dissection and chemoradiation/  2005  left breast cancer DCIS  s/p  lumpectomy and radiation   History of colon polyps    2007 hyperplagia   History of esophageal dilatation    2008   History of peptic ulcer    History of small bowel obstruction    2012   Hypothyroid 05/28/2011   Hypothyroidism    Incomplete emptying of bladder 07/30/2016   Left foot pain 12/11/2015   Overview:  Dr Cyril Mourning [facility]   Macular degeneration of right eye    Mild depression 05/28/2011   Mixed incontinence urge and stress 09/16/2011   S/p lumax desires  surgery    Morbid obesity ([place]) 04/10/2011   Numbness of left foot    4 toes   Osteoporosis 12/15/2016   Overview:  Dr Cletis Media   Paget's disease of vulva [facility])    Personal history of chemotherapy 1997   Personal history of radiation therapy 2005   PONV (postoperative nausea and vomiting)    and HARD TO WAKE   Seasonal allergies    Shortness of breath    SKIN CANCER, HX OF 08/23/2007   Qualifier: Diagnosis of  By: Doy Mince LPN, Megan     SUI (stress urinary incontinence, female)    Thrombocytopenic disorder ([place]) 02/25/2017   Wears glasses     Past Surgical History:  Procedure Laterality Date   BLADDER SUSPENSION  10/13/2011   Procedure: TRANSVAGINAL TAPE (TVT) PROCEDURE;  Surgeon: Delice Lesch, MD;  Location: [facility];  Service: Gynecology;  Laterality: N/A;   BREAST BIOPSY Left 03/19/2004   BREAST LUMPECTOMY Left 03-27-2004   CARDIAC CATHETERIZATION  10-30-2000  dr Wynonia Lawman   normal coronary arteries and LVF/  ef 65-70%   CLOSED MANIPULATION  W/ OPEN REDUCTION EXCHANGE TIBIAL FEMORAL BEARING POST TOTAL LEFT KNEE  04-14-2001   CYSTOSCOPY  10/13/2011   Procedure: CYSTOSCOPY;  Surgeon: Delice Lesch, MD;  Location: [facility];  Service: Gynecology;  Laterality: Bilateral;   DIAGNOSTIC LAPAROSCOPY     Colon d/t SBO   DILATION AND CURETTAGE OF UTERUS  1968   FOOT SURGERY Left 02-01-2009   TRIPLE ARTHRODESIS   FOOT SURGERY Left 11/14   I&D left foot with woundvac placement   INCISIONAL HERNIA REPAIR  05/05/2011   Procedure: LAPAROSCOPIC INCISIONAL HERNIA;  Surgeon: Edward Jolly, MD;  Location: [facility];  Service: General;  Laterality: N/A;  LAPAROSCOPIC REPAIR INCARCERATED INCISIONAL HERNIA  WITH MESH   KNEE ARTHROSCOPY Bilateral left 01-1984 & 04-1992/   right [id number]   LAPAROSCOPIC CHOLECYSTECTOMY  06-21-2010   LEFT HEART CATH AND CORONARY ANGIOGRAPHY N/A 04/13/2017   Procedure: LEFT HEART CATH AND CORONARY ANGIOGRAPHY;  Surgeon: Jettie Booze, MD;  Location: [facility];  Service: Cardiovascular;  Laterality: N/A;   LUMBAR SPINE SURGERY  1998   L5 -- S1   MASTECTOMY Right 1997   W/  LYMPH NODE DISSECTION AND RECONSTRUCTION WITH IMPLANTS AND EXPANDER   NEGATIVE SLEEP STUDY  12/26/2015   PORT-A-CATH  PLACEMENT AND REMOVAL  1997   REMOVAL RIGHT BREAST IMPLANT AND CAPSULECTOMY  06-03-1999   REVERSE SHOULDER ARTHROPLASTY Left 10/25/2019   Procedure: LEFT REVERSE SHOULDER ARTHROPLASTY;  Surgeon: Meredith Pel, MD;  Location: [facility];  Service: Orthopedics;  Laterality: Left;   REVISION TOTAL KNEE ARTHROPLASTY Left 07-07-2005  &  12-10-2001   12-10-2001  REMOVAL TOTAL KNEE/ I&D / INSERTION ANTIBIOTIC SPACER   REVISION TOTAL KNEE ARTHROPLASTY Right 07-07-2005   ROTATOR CUFF REPAIR Right    TENDON REPAIR RIGHT RING FINGER  2011   THUMB TRIGGER RELEASE Right 1993   THYROIDECTOMY, PARTIAL  1983   TONSILLECTOMY  1968   TOTAL KNEE ARTHROPLASTY Bilateral right 05-04-2000/  left 03-29-2001   VULVECTOMY  05/27/2012   Procedure: WIDE EXCISION VULVECTOMY;  Surgeon: Imagene Gurney A. Alycia Rossetti, MD;  Location: [facility];  Service: Gynecology;  Laterality: N/A;  Wide Local Excision of Vulva    VULVECTOMY N/A 11/23/2012   Procedure: WIDE LOCAL EXCISION OF THE VULVA;  Surgeon: Imagene Gurney A. Alycia Rossetti, MD;  Location: [facility];  Service: Gynecology;  Laterality: N/A;  left inferior vulvar biopsy excision left superior lesion left inferior vulvar excision   VULVECTOMY N/A 08/02/2013   Procedure: WIDE LOCAL  EXCISION VULVA;  Surgeon: Imagene Gurney A. Alycia Rossetti, MD;  Location: [facility];  Service: Gynecology;  Laterality: N/A;   VULVECTOMY Left 03/29/2014   Procedure: WIDE LOCAL EXCISION OF LEFT VULVA ;  Surgeon: Imagene Gurney A. Alycia Rossetti, MD;  Location: [facility];  Service: Gynecology;  Laterality: Left;   VULVECTOMY Bilateral 01/24/2015   Procedure: WIDE LOCAL EXCISION VULVA;  Surgeon: [name], MD;  Location: [facility];  Service: Gynecology;  Laterality: Bilateral;   VULVECTOMY PARTIAL   07-16-2010    Family History  Problem Relation Age of Onset   Kidney cancer Father 14       metastatic    Alzheimer's disease Mother    Other Mother        ? Cancer   Heart attack Maternal Grandmother    Alcohol abuse Brother    Bladder Cancer Brother        dx 80s   Breast cancer Paternal Aunt        dx after 86   Bladder Cancer Paternal Aunt        dx after 43   Breast cancer Cousin        two paternal female cousins; unknown age of diagnosis   Cancer Maternal Aunt        ? gallbladder; dx 73s   Cancer Maternal Uncle        unknown type; ? pancreatic ? colon; dx after 69   Brain cancer Paternal Uncle        unknown age of dx   Melanoma Brother        dx late 84s-early 60s   Bone cancer Maternal Uncle        dx after 78   Leukemia Maternal Uncle        dx after 94   Bone cancer Maternal Uncle        dx after 77   Lung cancer Maternal Uncle        dx after 51   Brain cancer Cousin        two maternal female cousins; dx after 76   Stomach cancer Paternal Aunt        unknown age of dx    Social History   Socioeconomic History   Marital  status: Married    Spouse name: Not on file   Number of children: Not on file   Years of education: Not on file   Highest education level: Not on file  Occupational History   Not on file  Tobacco Use   Smoking status: Former    Packs/day: 2.00    Years: 41.00    Pack years: 82.00    Types: Cigarettes    Quit date: 06/05/1999    Years since quitting: 21.9   Smokeless tobacco: Never  Vaping Use   Vaping Use: Never used  Substance and Sexual Activity   Alcohol use: No   Drug use: No   Sexual activity: Not on file  Other Topics Concern   Not on file  Social History Narrative   Not on file   Social Determinants of Health   Financial Resource Strain: Not on file  Food Insecurity: Not on file  Transportation Needs: Not on file  Physical Activity: Not on file  Stress: Not on file  Social Connections: Not on file     Current Medications:  Current Outpatient Medications:    acetaminophen (TYLENOL) 500 MG tablet, Take 1,000 mg by mouth every 6 (six) hours as needed for mild pain or moderate pain., Disp: , Rfl:    albuterol (PROVENTIL HFA;VENTOLIN HFA) 108 (90 BASE) MCG/ACT inhaler, Inhale 2 puffs into the lungs every 4 (four) hours as needed for wheezing or shortness of breath. Reported on 08/01/2015, Disp: , Rfl:    aspirin EC 81 MG EC tablet, Take 1 tablet (81 mg total) daily by mouth., Disp: 30 tablet, Rfl: 0   atorvastatin (LIPITOR) 40 MG tablet, TAKE 1 TABLET BY MOUTH DAILY *MUST MAKE APPT*, Disp: 90 tablet, Rfl: 3   bisoprolol (ZEBETA) 5 MG tablet, TAKE 1 TABLET BY MOUTH EVERY DAY, Disp: 90 tablet, Rfl: 3   budesonide-formoterol (SYMBICORT) 160-4.5 MCG/ACT inhaler, Inhale 2 puffs into the lungs daily as needed (Wheezing in the winter)., Disp: , Rfl:    buPROPion (WELLBUTRIN XL) 150 MG 24 hr tablet, Take 1 tablet by mouth every morning., Disp: , Rfl:    celecoxib (CELEBREX) 200 MG capsule, Take 1 capsule (200 mg total) by mouth daily., Disp: 30 capsule, Rfl: 0   Cholecalciferol (VITAMIN D3) 1000 UNITS CAPS, Take 2,000 Units by mouth daily. , Disp: , Rfl:    citalopram (CELEXA) 40 MG tablet, Take 1 tablet by mouth daily., Disp: , Rfl:    furosemide (LASIX) 40 MG tablet, Take 1.5 tablets (60 mg total) by mouth daily. Take extra 1/2 tablet as needed for weight gain., Disp: 150 tablet, Rfl: 3   hydrOXYzine (VISTARIL) 25 MG capsule, Take 25 mg by mouth 3 (three) times daily as needed for itching., Disp: , Rfl:    ibandronate (BONIVA) 150 MG tablet, Take 150 mg by mouth every 30 (thirty) days. Take in the morning with a full glass of water, on an empty stomach, and do not take anything else by mouth or lie down for the next 30 min., Disp: , Rfl:    ipratropium-albuterol (DUONEB) 0.5-2.5 (3) MG/3ML SOLN, Take 3 mLs by nebulization every 4 (four) hours as needed (Wheezing)., Disp: , Rfl:    ketoconazole (NIZORAL)  2 % shampoo, Apply 1 application topically every 14 (fourteen) days., Disp: , Rfl:    levothyroxine (SYNTHROID) 200 MCG tablet, Take 200 mcg by mouth daily before breakfast. , Disp: , Rfl:    montelukast (SINGULAIR) 10 MG tablet, Take 10 mg by mouth  daily., Disp: , Rfl:    Multiple Vitamin (MULTIVITAMIN WITH MINERALS) TABS tablet, Take 1 tablet daily by mouth., Disp: , Rfl:    nystatin (MYCOSTATIN/NYSTOP) powder, Apply topically 4 (four) times daily., Disp: 60 g, Rfl: 6   nystatin cream (MYCOSTATIN), Apply 1 application topically 2 (two) times daily., Disp: 30 g, Rfl: 6   pantoprazole (PROTONIX) 40 MG tablet, Take 40 mg by mouth daily., Disp: , Rfl:    polyvinyl alcohol (LIQUIFILM TEARS) 1.4 % ophthalmic solution, Place 1 drop into both eyes daily as needed for dry eyes. , Disp: , Rfl:    clindamycin (CLEOCIN) 150 MG capsule, Take 150 mg by mouth 3 (three) times daily. Only foe dental visits (Patient not taking: Reported on 05/02/2021), Disp: , Rfl:    HYDROcodone-acetaminophen (NORCO/VICODIN) 5-325 MG tablet, Take 1 tablet by mouth every 8 (eight) hours as needed for moderate pain. (Patient not taking: Reported on 03/04/2021), Disp: 30 tablet, Rfl: 0   methocarbamol (ROBAXIN) 500 MG tablet, TAKE 1 TABLET BY MOUTH EVERY 8 HOURS AS NEEDED (Patient not taking: Reported on 03/04/2021), Disp: 30 tablet, Rfl: 0   mexiletine (MEXITIL) 150 MG capsule, TAKE 1 CAPSULE (150 MG TOTAL) BY MOUTH 3 (THREE) TIMES DAILY., Disp: 90 capsule, Rfl: 2   spironolactone (ALDACTONE) 25 MG tablet, Take 1 tablet (25 mg total) by mouth daily., Disp: 90 tablet, Rfl: 3  Review of Systems: Pertinent positives include unexpected weight change, hearing loss at times, ringing in ears, leg swelling, shortness of breath, abdominal distention, abdominal pain, constipation, urinary frequency, pelvic pain, joint pain, itching. Denies appetite changes, fevers, chills, fatigue. Denies neck lumps or masses, mouth sores or voice  changes. Denies cough or wheezing.   Denies chest pain or palpitations. Denies leg swelling. Denies blood in stools, diarrhea, nausea, vomiting, or early satiety. Denies pain with intercourse, dysuria, hematuria or incontinence. Denies hot flashes, vaginal bleeding or vaginal discharge.   Denies back pain or muscle pain/cramps. Denies rash, or wounds. Denies dizziness, headaches, numbness or seizures. Denies swollen lymph nodes or glands, denies easy bruising or bleeding. Denies anxiety, depression, confusion, or decreased concentration.  Physical Exam: BP (!) 154/65 (BP Location: Left Arm, Patient Position: Sitting)   Pulse 66   Temp 97.7 F (36.5 C) (Tympanic)   Resp 18   Ht 5\' 5"  (1.651 m)   Wt 261 lb (118.4 kg)   SpO2 98%   BMI 43.43 kg/m  General: Alert, oriented, no acute distress. HEENT: Normocephalic, atraumatic, sclera anicteric. Chest: Unlabored breathing on room air. Extremities: Grossly normal range of motion.  Warm, well perfused.  1+ edema bilaterally. Skin: Mild erythema within both groin creases consistent with Candida infection. Lymphatics: No cervical, supraclavicular, or inguinal adenopathy. GU: Vulva notable for significant anatomic changes related to prior surgeries.  There is a small area of erythematous skin along the right mid labia with some desquamation, tender to palpation.  No other significant areas of erythema noted.  Vulvar biopsy Preoperative diagnosis: Vulvar lesion, history of Paget's disease Postoperative diagnosis: Same as above Physician: Berline Lopes MD Estimated blood loss: Less than 10 cc Specimen: Vulvar biopsy, right Procedure: After the procedure was discussed with the patient and verbal consent was obtained, the patient was placed in dorsolithotomy position.  The vulvar area was identified to be biopsy and cleansed with Betadine x3.  Approximately 3 cc of 1% lidocaine was injected into the subdermal tissue before adequate anesthesia was  achieved.  3 mm punch biopsy was taken and placed in formalin.  Hemostasis was achieved with pressure and silver nitrate.  Overall the patient tolerated the procedure.  Laboratory & Radiologic Studies: None new  Assessment & Plan: [name] is a 75 y.o. woman with long history of Paget's disease of the vulva.  Area concerning for possible recurrent of Paget's biopsy today.  I will call the patient with biopsy results once available.  If this shows recurrent Paget's disease, given location and small area involved, will favor topical treatment with Aldara.  Patient has some Aldara left, but will likely need new prescription.  We will otherwise plan for 44-month surveillance visit.  34 minutes of total time was spent for this patient encounter, including preparation, face-to-face counseling with the patient and coordination of care, and documentation of the encounter.  Jeral Pinch, MD  Division of Gynecologic Oncology  Department of Obstetrics and Gynecology  [facility]

## 2021-05-03 ENCOUNTER — Encounter: Payer: Self-pay | Admitting: Gynecologic Oncology

## 2021-05-03 ENCOUNTER — Other Ambulatory Visit: Payer: Self-pay

## 2021-05-03 ENCOUNTER — Inpatient Hospital Stay: Payer: Medicare HMO | Attending: Gynecologic Oncology | Admitting: Gynecologic Oncology

## 2021-05-03 VITALS — BP 154/65 | HR 66 | Temp 97.7°F | Resp 18 | Ht 65.0 in | Wt 261.0 lb

## 2021-05-03 DIAGNOSIS — J45909 Unspecified asthma, uncomplicated: Secondary | ICD-10-CM | POA: Diagnosis not present

## 2021-05-03 DIAGNOSIS — Z791 Long term (current) use of non-steroidal anti-inflammatories (NSAID): Secondary | ICD-10-CM | POA: Insufficient documentation

## 2021-05-03 DIAGNOSIS — Z9079 Acquired absence of other genital organ(s): Secondary | ICD-10-CM | POA: Insufficient documentation

## 2021-05-03 DIAGNOSIS — M810 Age-related osteoporosis without current pathological fracture: Secondary | ICD-10-CM | POA: Insufficient documentation

## 2021-05-03 DIAGNOSIS — C4499 Other specified malignant neoplasm of skin, unspecified: Secondary | ICD-10-CM

## 2021-05-03 DIAGNOSIS — Z6841 Body Mass Index (BMI) 40.0 and over, adult: Secondary | ICD-10-CM | POA: Insufficient documentation

## 2021-05-03 DIAGNOSIS — Z8544 Personal history of malignant neoplasm of other female genital organs: Secondary | ICD-10-CM | POA: Diagnosis not present

## 2021-05-03 DIAGNOSIS — Z7951 Long term (current) use of inhaled steroids: Secondary | ICD-10-CM | POA: Diagnosis not present

## 2021-05-03 DIAGNOSIS — Z79899 Other long term (current) drug therapy: Secondary | ICD-10-CM | POA: Diagnosis not present

## 2021-05-03 DIAGNOSIS — M1990 Unspecified osteoarthritis, unspecified site: Secondary | ICD-10-CM | POA: Diagnosis not present

## 2021-05-03 DIAGNOSIS — I4891 Unspecified atrial fibrillation: Secondary | ICD-10-CM | POA: Insufficient documentation

## 2021-05-03 DIAGNOSIS — E039 Hypothyroidism, unspecified: Secondary | ICD-10-CM | POA: Diagnosis not present

## 2021-05-03 DIAGNOSIS — F419 Anxiety disorder, unspecified: Secondary | ICD-10-CM | POA: Insufficient documentation

## 2021-05-03 DIAGNOSIS — K219 Gastro-esophageal reflux disease without esophagitis: Secondary | ICD-10-CM | POA: Insufficient documentation

## 2021-05-03 DIAGNOSIS — Z853 Personal history of malignant neoplasm of breast: Secondary | ICD-10-CM | POA: Diagnosis not present

## 2021-05-03 DIAGNOSIS — F32A Depression, unspecified: Secondary | ICD-10-CM | POA: Diagnosis not present

## 2021-05-03 DIAGNOSIS — I252 Old myocardial infarction: Secondary | ICD-10-CM | POA: Diagnosis not present

## 2021-05-03 DIAGNOSIS — Z7982 Long term (current) use of aspirin: Secondary | ICD-10-CM | POA: Insufficient documentation

## 2021-05-03 DIAGNOSIS — N9089 Other specified noninflammatory disorders of vulva and perineum: Secondary | ICD-10-CM

## 2021-05-03 NOTE — Patient Instructions (Addendum)
It was a pleasure meeting you today.  I we will call you when I get the results of your biopsy from today.  If this confirms Paget's disease, given the small area affected, I would favor that we use the Aldara cream.  We will plan on a follow-up visit in 3 months, although this may change depending on biopsy results.

## 2021-05-06 ENCOUNTER — Telehealth: Payer: Self-pay | Admitting: *Deleted

## 2021-05-06 LAB — SURGICAL PATHOLOGY

## 2021-05-06 NOTE — Telephone Encounter (Signed)
Patient called and stated that she needs a refill on her Aldara. Patient request the script to be called into [facility]

## 2021-05-07 ENCOUNTER — Other Ambulatory Visit: Payer: Self-pay | Admitting: Gynecologic Oncology

## 2021-05-07 ENCOUNTER — Telehealth: Payer: Self-pay | Admitting: *Deleted

## 2021-05-07 ENCOUNTER — Other Ambulatory Visit (HOSPITAL_COMMUNITY): Payer: Self-pay

## 2021-05-07 DIAGNOSIS — C4499 Other specified malignant neoplasm of skin, unspecified: Secondary | ICD-10-CM

## 2021-05-07 MED ORDER — IMIQUIMOD 5 % EX CREA: TOPICAL_CREAM | CUTANEOUS | 1 refills | Status: DC

## 2021-05-07 NOTE — Telephone Encounter (Signed)
Called and left the patient a message that the prescription was called in to her pharmacy

## 2021-05-07 NOTE — Telephone Encounter (Signed)
Spoke with Ms. Swiech this afternoon. Advised patient that her Aldara prescription can be filled at [facility] out patient pharmacy for $13.61. She reports finding imiquimod 5% cream from Dr. Alycia Rossetti and asked if she can use that. Instructed that imiquimod is the generic form of aldara and is ok to use as long as its not expired. Patient states its not and will use that. Patient inquiring how often to use ad for how long. Instructed to apply 3 times a week until her follow up appointment with Dr. Berline Lopes on 08/02/21. Patient verbalized understanding.  She will call our office when she runs out of aldara cream so new prescription can be sent to [facility].   Instructed to call with any questions or concerns.

## 2021-05-07 NOTE — Telephone Encounter (Signed)
Patient called and stated "I called and spoke with pharmacy about the Aldara. One it is to expense. Plus the pharmacist told me I would have to wear gloves with the Aldara and it would take off my skin. I want to go back to the Myocatatin the Dr. Alycia Rossetti gave me before." Explained that the message would be given to Dr Berline Lopes and the office would call her back

## 2021-07-31 ENCOUNTER — Telehealth: Payer: Self-pay | Admitting: *Deleted

## 2021-07-31 NOTE — Telephone Encounter (Signed)
error 

## 2021-08-02 ENCOUNTER — Inpatient Hospital Stay: Payer: Medicare HMO | Admitting: Gynecologic Oncology

## 2021-08-02 ENCOUNTER — Ambulatory Visit: Payer: Medicare HMO | Admitting: Gynecologic Oncology

## 2021-08-06 ENCOUNTER — Encounter: Payer: Self-pay | Admitting: Gynecologic Oncology

## 2021-08-12 ENCOUNTER — Telehealth: Payer: Self-pay | Admitting: *Deleted

## 2021-08-12 ENCOUNTER — Inpatient Hospital Stay: Payer: Medicare HMO | Admitting: Gynecologic Oncology

## 2021-08-12 DIAGNOSIS — C4499 Other specified malignant neoplasm of skin, unspecified: Secondary | ICD-10-CM

## 2021-08-12 NOTE — Telephone Encounter (Signed)
Returned the patient's call and rescheduled her appt from today to 3/31 per her request  ?

## 2021-08-12 NOTE — Telephone Encounter (Signed)
Patient called and moved her appt from 3/31 to 4/3 ?

## 2021-08-23 ENCOUNTER — Ambulatory Visit: Payer: Medicare HMO | Admitting: Gynecologic Oncology

## 2021-08-26 ENCOUNTER — Other Ambulatory Visit: Payer: Self-pay

## 2021-08-26 ENCOUNTER — Inpatient Hospital Stay: Payer: Medicare HMO | Attending: Gynecologic Oncology | Admitting: Gynecologic Oncology

## 2021-08-26 ENCOUNTER — Encounter: Payer: Self-pay | Admitting: Gynecologic Oncology

## 2021-08-26 VITALS — BP 150/73 | HR 67 | Temp 97.8°F | Resp 18 | Ht 65.0 in | Wt 253.8 lb

## 2021-08-26 DIAGNOSIS — Z8544 Personal history of malignant neoplasm of other female genital organs: Secondary | ICD-10-CM | POA: Insufficient documentation

## 2021-08-26 DIAGNOSIS — C4499 Other specified malignant neoplasm of skin, unspecified: Secondary | ICD-10-CM

## 2021-08-26 DIAGNOSIS — Z9079 Acquired absence of other genital organ(s): Secondary | ICD-10-CM | POA: Insufficient documentation

## 2021-08-26 NOTE — Patient Instructions (Addendum)
Overall, things appear stable on your exam.  I am not seeing changes that look concerning for Paget's.  I will plan to see you in 3 months.  Please call if you develop any symptoms (like itching, bleeding, discharge, pain) before your next visit. ? ?You do not need to use the cream over the next 3 months. ?

## 2021-08-26 NOTE — Progress Notes (Signed)
Gynecologic Oncology Return Clinic Visit ? ?08/26/21 ? ?Reason for Visit: history of Paget's diseas ? ?Treatment History: ?Oncology History  ?Extramammary Paget's disease  ?03/30/2012 Initial Diagnosis  ? Extramammary Paget's disease ?  ?05/27/2012 Surgery  ? WLE vulva left ?  ?11/23/2012 Surgery  ? WLE vulva on left ?  ?08/02/2013 Surgery  ? WLE vulva ?  ?03/29/2014 Surgery  ? WLE vulva on left with +  margin 6-9:00 and 12:00 ?  ?01/24/2015 Surgery  ? Bilateral WLE ?  ? ?Patient is a 76 year old with a history of a partial simple vulvectomy in February of 2012 for vulvar Paget's disease treated for many years by Dr Alycia Rossetti. Some of the surgical margins were positive. Her postoperative course was uncomplicated. She was last seen by our service in 12/14. At that time there was no evidence of any lesions consistent with Paget's. However, there is considerable amount of excoriations throughout the entire vulvar and medial thighs consistent with chronic vulvitis.In December 2013 she had a hernia repair due to bowel obstruction by Dr. Excell Seltzer with permanent mesh. In May 2013 she also had a TVT bladder by Dr. Everett Graff.  ?  ?She underwent a wide local excision of the left vulva on January 2 of year 2014. Operative findings included 2 separate 3 x 3 and 1.2 cm lesions consistent with Paget's on the left.  ?Pathology revealed 2 foci of extramammary Paget's disease extending to the left inked margin in the right and anterior tip margins at multiple foci. The posterior margin was negative for malignancy.  ?  ?On 11/23/2012 showed a repeat wide local excision of vulva x2 with the posterior fourchette biopsy. Findings revealed along the left labia minora/vagina is a Paget's disease measuring approximately 2.5 x 1 cm in the superior aspect of her prior left vulvar excision. The inferior aspect of a similar lesion. Biopsy and excision was performed in this area showing extramammary paget disease in all sites. ?  ?She had been seen in  January of 2015. At that time exam was concerning for Paget's.  ?  ?On 08/02/2013 she underwent wide local excisions of the vulva x2. Operative findings included pale pink raised lesion measuring 1 x 0.5 cm the left upper vulva near the prior excision site. There is a pale raised lesion measuring 1.5 x 1 cm on the left crossing midline perineal body. Pathology revealed left superior vulvar excision with extramammary Paget's disease extending to the 9:00 margin. The other margins were negative for tumor. Of the left of midline lesion she extramammary Paget's disease extending to the 12:00 margin and focally at the 6:00 margin but otherwise negative margins.  ?  ?She was seen on March 16, 2014 at which time there was an area on the left vulva with hyperkeratosis. A biopsy of that area was performed consistent with Paget's disease. On March 29, 2014 she underwent wide local excision of the vulva. On exam she had a 1 cm left vulvar lesion just below the left labia minora. She underwent a wide local excision. He received a 3.7 x 1.7 x 0.3 cm lesion. There was extramammary Paget's disease extending to the 6 to 9:00 margin in the 12:00 margin. This is despite grossly negative margins on physical examination.  ?  ?She had an excision in November, 2015 that revealed extramammary pagets disease, extending to 6-9 o'clock and 12 o'clock margins.  ?  ?She was seen in February 2016 at which time there was concern for recurrent disease. She decided to  proceed with Aldara therapy. She used for approximate 4 weeks and then decided to stop it secondary to the expense of the medication. She then started using the clobetasol. While she was using the Aldara all the pruritus in her vulva resolved itself. Since she stopped it and was using the steroid cream the pruritus returned. ?  ?She was seen in May 2016 at which time she has small area consistent with Paget's on the right labia minora and a larger patch on the left. Biopsy on the  right revealed extramammary Paget's. After discussion she wished to try the Aldara again.  ?  ?She was seen December 27, 2014 at which time there was evidence clearly a Paget's disease and she scheduled for surgery. On January 24, 2015 she underwent bilateral wide local excisions of the vulva. On the right vulva there was a 2 cm patch of Paget's disease in the mid labia majora. On the left side. The remaining labia minora in the area prior to the excision are smaller lesion consistent with Paget's disease. Pathology confirmed pagets with positive margins. ?   ?The patient was taken to the operating room on 01/31/2016 for preoperative mapping of her lesion for her planned wide local excision of the left vulva and smaller excision on the right. Operative findings at that time were notable for surgically absent inferior aspect of the left labium minora and majora. Multiple areas with white flaky skin changes were biopsied and the abnormal-appearing tissue. Changes noted in the area of the clitoris and perineum and at the introitus just distal to the hymen. Biopsies were collected on all of the sites. Pagets disease was confirmed with positive margins.  ?  ?She was evaluated in September, 2020 and biopsy of the vulva confirmed recurrent Pagets disease. She underwent bilateral simple partial vulvectomy on 03/04/19 with plastic surgery reconstruction (gracilis flap). Pathology confirmed recurrent pagets with positive margins bilaterally.  ?  ?In June, 2021 she had a vulvar biopsy positive for pagets which was treated with Imiquimod by Dr Alycia Rossetti at [facility]. ? ?Interval History: ?Doing well.  Used the Aldara for about 4 weeks and then stopped.  She stopped after her vulvar symptoms resolved.  Denies any pruritus, pain, or discharge.  Continues to struggle with constipation, uses Dulcolax and stool softener with good relief.  Denies urinary symptoms. ? ?Past Medical/Surgical History: ?Past Medical History:  ?Diagnosis Date   ? ACS (acute coronary syndrome) ([place]) 04/11/2017  ? ALLERGIC RHINITIS 08/23/2007  ? Qualifier: Diagnosis of  By: Doy Mince LPN, Megan    ? Anxiety   ? Arthritis   ? Asthma   ? ASTHMA 08/23/2007  ? Qualifier: Diagnosis of  By: Doy Mince LPN, Megan    ? Bigeminy 04/10/2017  ? 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 Cancer of right breast, stage 2 ([place]) 05/28/2011  ? 2.8 cm invasive 1 node pos ER pos S/P mastectomy 06/1995 then AC x 4 then Tam X 5   ? Chest pain, rule out acute myocardial infarction 04/10/2017  ? Chronic constipation   ? Chronic cystitis 04/09/2017  ? Cough 08/23/2007  ? Qualifier: Diagnosis of  By: Gwenette Greet MD, Armando Reichert   ? Depression   ? 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 Dysrhythmia 2020  ? Had Afib  ? 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  ? Fibromyalgia   ? GERD (gastroesophageal reflux disease)   ? History  of adenomatous polyp of colon 09/30/2018  ? History of breast cancer no recurrence  ? 1997  right breast cancer  s/p  mastectomy w/ snl dissection and chemoradiation/  2005  left breast cancer DCIS  s/p  lumpectomy and radiation  ? History of colon polyps   ? 2007 hyperplagia  ? History of esophageal dilatation   ? 2008  ? History of peptic ulcer   ? History of small bowel obstruction   ? 2012  ? Hypothyroid 05/28/2011  ? Hypothyroidism   ? Incomplete emptying of bladder 07/30/2016  ? Left foot pain 12/11/2015  ? Overview:  Dr Cyril Mourning [facility]  ? Macular degeneration of right eye   ? Mild depression 05/28/2011  ? Mixed incontinence urge and stress 09/16/2011  ? S/p lumax desires surgery   ? Morbid obesity ([place]) 04/10/2011  ? Numbness of left foot   ? 4 toes  ? Osteoporosis 12/15/2016  ? Overview:  Dr Cletis Media  ? 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[place])   ? Personal history of chemotherapy 1997  ? Personal history of radiation therapy 2005  ? PONV (postoperative nausea and vomiting)   ? and HARD TO WAKE  ? Seasonal  allergies   ? Shortness of breath   ? SKIN CANCER, HX OF 08/23/2007  ? Qualifier: Diagnosis of  By: Doy Mince LPN, Megan    ? SUI (stress urinary incontinence, female)   ? Thrombocytopenic disorder ([place]) 1

## 2021-10-15 ENCOUNTER — Telehealth: Payer: Self-pay | Admitting: Adult Health

## 2021-10-15 NOTE — Telephone Encounter (Signed)
.  Called patient to schedule appointment per 5/22 inbasket, patient is aware of date and time.

## 2021-11-04 ENCOUNTER — Other Ambulatory Visit: Payer: Medicare HMO

## 2021-11-04 ENCOUNTER — Encounter: Payer: Medicare HMO | Admitting: Adult Health

## 2021-11-18 ENCOUNTER — Other Ambulatory Visit: Payer: Self-pay | Admitting: Student

## 2021-11-27 ENCOUNTER — Other Ambulatory Visit: Payer: Self-pay | Admitting: Cardiology

## 2021-12-02 ENCOUNTER — Encounter: Payer: Self-pay | Admitting: *Deleted

## 2021-12-03 ENCOUNTER — Telehealth: Payer: Self-pay | Admitting: *Deleted

## 2021-12-03 NOTE — Telephone Encounter (Signed)
Late entry----patient called yesterday and scheduled a follow up appt with Dr [name] of 7/27. Patient also canceled her SCP appt and didn't want to reschedule

## 2021-12-06 ENCOUNTER — Encounter: Payer: Medicare HMO | Admitting: Adult Health

## 2021-12-06 ENCOUNTER — Other Ambulatory Visit: Payer: Medicare HMO

## 2021-12-19 ENCOUNTER — Inpatient Hospital Stay: Payer: Medicare HMO | Admitting: Gynecologic Oncology

## 2021-12-19 NOTE — Progress Notes (Deleted)
PCP:  Chesley Noon, MD Primary Cardiologist: None Electrophysiologist: None   [name] is a 76 y.o. female seen today for None for routine electrophysiology followup.  Since last being seen in our clinic the patient reports doing ***.  she denies chest pain, palpitations, dyspnea, PND, orthopnea, nausea, vomiting, dizziness, syncope, edema, weight gain, or early satiety.  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   Cancer of right breast, stage 2 ([place]) 05/28/2011   2.8 cm invasive 1 node pos ER pos S/P mastectomy 06/1995 then AC x 4 then Tam X 5    Chest pain, rule out acute myocardial infarction 04/10/2017   Chronic constipation    Chronic cystitis 04/09/2017   Cough 08/23/2007   Qualifier: Diagnosis of  By: Gwenette Greet MD, Armando Reichert    Depression    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   Dysrhythmia 2020   Had Afib   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   Fibromyalgia    GERD (gastroesophageal reflux disease)    History of adenomatous polyp of colon 09/30/2018   History of breast cancer no recurrence   1997  right breast cancer  s/p  mastectomy w/ snl dissection and chemoradiation/  2005  left breast cancer DCIS  s/p  lumpectomy and radiation   History of colon polyps    2007 hyperplagia   History of esophageal dilatation    2008   History of peptic ulcer    History of small bowel obstruction    2012   Hypothyroid 05/28/2011   Hypothyroidism    Incomplete emptying of bladder 07/30/2016   Left foot pain 12/11/2015   Overview:  Dr Cyril Mourning [facility]   Macular degeneration of right  eye    Mild depression 05/28/2011   Mixed incontinence urge and stress 09/16/2011   S/p lumax desires surgery    Morbid obesity ([place]) 04/10/2011   Numbness of left foot    4 toes   Osteoporosis 12/15/2016   Overview:  Dr Cletis Media   Paget's disease of vulva [facility])    Personal history of chemotherapy 1997   Personal history of radiation therapy 2005   PONV (postoperative nausea and vomiting)    and HARD TO WAKE   Seasonal allergies    Shortness of breath    SKIN CANCER, HX OF 08/23/2007   Qualifier: Diagnosis of  By: Doy Mince LPN, Megan     SUI (stress urinary incontinence, female)    Thrombocytopenic disorder ([place]) 02/25/2017   Wears glasses    Past Surgical History:  Procedure Laterality Date   BLADDER SUSPENSION  10/13/2011   Procedure: TRANSVAGINAL TAPE (TVT) PROCEDURE;  Surgeon: Delice Lesch, MD;  Location: [facility];  Service: Gynecology;  Laterality: N/A;   BREAST BIOPSY Left 03/19/2004   BREAST LUMPECTOMY Left 03-27-2004   CARDIAC CATHETERIZATION  10-30-2000  dr Wynonia Lawman   normal coronary arteries and LVF/  ef 65-70%   CLOSED MANIPULATION  W/ OPEN REDUCTION EXCHANGE TIBIAL FEMORAL BEARING POST TOTAL LEFT KNEE  04-14-2001   CYSTOSCOPY  10/13/2011  Procedure: CYSTOSCOPY;  Surgeon: Delice Lesch, MD;  Location: [facility];  Service: Gynecology;  Laterality: Bilateral;   DIAGNOSTIC LAPAROSCOPY     Colon d/t SBO   DILATION AND CURETTAGE OF UTERUS  1968   FOOT SURGERY Left 02-01-2009   TRIPLE ARTHRODESIS   FOOT SURGERY Left 11/14   I&D left foot with woundvac placement   INCISIONAL HERNIA REPAIR  05/05/2011   Procedure: LAPAROSCOPIC INCISIONAL HERNIA;  Surgeon: Edward Jolly, MD;  Location: [facility];  Service: General;  Laterality: N/A;  LAPAROSCOPIC REPAIR INCARCERATED INCISIONAL HERNIA  WITH MESH   KNEE ARTHROSCOPY Bilateral left 01-1984 & 04-1992/   right [id number]   LAPAROSCOPIC CHOLECYSTECTOMY  06-21-2010   LEFT HEART CATH AND CORONARY ANGIOGRAPHY N/A 04/13/2017   Procedure:  LEFT HEART CATH AND CORONARY ANGIOGRAPHY;  Surgeon: Jettie Booze, MD;  Location: [facility];  Service: Cardiovascular;  Laterality: N/A;   LUMBAR SPINE SURGERY  1998   L5 -- S1   MASTECTOMY Right 1997   W/  LYMPH NODE DISSECTION AND RECONSTRUCTION WITH IMPLANTS AND EXPANDER   NEGATIVE SLEEP STUDY  12/26/2015   PORT-A-CATH PLACEMENT AND REMOVAL  1997   REMOVAL RIGHT BREAST IMPLANT AND CAPSULECTOMY  06-03-1999   REVERSE SHOULDER ARTHROPLASTY Left 10/25/2019   Procedure: LEFT REVERSE SHOULDER ARTHROPLASTY;  Surgeon: Meredith Pel, MD;  Location: [facility];  Service: Orthopedics;  Laterality: Left;   REVISION TOTAL KNEE ARTHROPLASTY Left 07-07-2005  &  12-10-2001   12-10-2001  REMOVAL TOTAL KNEE/ I&D / INSERTION ANTIBIOTIC SPACER   REVISION TOTAL KNEE ARTHROPLASTY Right 07-07-2005   ROTATOR CUFF REPAIR Right    TENDON REPAIR RIGHT RING FINGER  2011   THUMB TRIGGER RELEASE Right 1993   THYROIDECTOMY, PARTIAL  1983   TONSILLECTOMY  1968   TOTAL KNEE ARTHROPLASTY Bilateral right 05-04-2000/  left 03-29-2001   VULVECTOMY  05/27/2012   Procedure: WIDE EXCISION VULVECTOMY;  Surgeon: Imagene Gurney A. Alycia Rossetti, MD;  Location: [facility];  Service: Gynecology;  Laterality: N/A;  Wide Local Excision of Vulva    VULVECTOMY N/A 11/23/2012   Procedure: WIDE LOCAL EXCISION OF THE VULVA;  Surgeon: Imagene Gurney A. Alycia Rossetti, MD;  Location: [facility];  Service: Gynecology;  Laterality: N/A;  left inferior vulvar biopsy excision left superior lesion left inferior vulvar excision   VULVECTOMY N/A 08/02/2013   Procedure: WIDE LOCAL  EXCISION VULVA;  Surgeon: Imagene Gurney A. Alycia Rossetti, MD;  Location: [facility];  Service: Gynecology;  Laterality: N/A;   VULVECTOMY Left 03/29/2014   Procedure: WIDE LOCAL EXCISION OF LEFT VULVA ;  Surgeon: Imagene Gurney A. Alycia Rossetti, MD;  Location: [facility];  Service: Gynecology;  Laterality: Left;   VULVECTOMY Bilateral 01/24/2015   Procedure: WIDE LOCAL EXCISION VULVA;  Surgeon: [name], MD;   Location: [place] [facility];  Service: Gynecology;  Laterality: Bilateral;   VULVECTOMY PARTIAL  07-16-2010    Current Outpatient Medications  Medication Sig Dispense Refill   acetaminophen (TYLENOL) 500 MG tablet Take 1,000 mg by mouth every 6 (six) hours as needed for mild pain or moderate pain.     albuterol (PROVENTIL HFA;VENTOLIN HFA) 108 (90 BASE) MCG/ACT inhaler Inhale 2 puffs into the lungs every 4 (four) hours as needed for wheezing or shortness of breath. Reported on 08/01/2015     aspirin EC 81 MG EC tablet Take 1 tablet (81 mg total) daily by mouth. 30 tablet 0   atorvastatin (LIPITOR) 40 MG tablet TAKE 1 TABLET BY MOUTH DAILY *MUST MAKE  APPT* 90 tablet 3   bisoprolol (ZEBETA) 5 MG tablet TAKE 1 TABLET BY MOUTH EVERY DAY 90 tablet 0   budesonide-formoterol (SYMBICORT) 160-4.5 MCG/ACT inhaler Inhale 2 puffs into the lungs daily as needed (Wheezing in the winter).     buPROPion (WELLBUTRIN XL) 150 MG 24 hr tablet Take 1 tablet by mouth every morning.     Calcium Carbonate-Vitamin D (OYSTER SHELL CALCIUM/D) 500-5 MG-MCG TABS Take by mouth.     celecoxib (CELEBREX) 200 MG capsule Take 1 capsule (200 mg total) by mouth daily. 30 capsule 0   Cholecalciferol (VITAMIN D3) 1000 UNITS CAPS Take 2,000 Units by mouth daily.      citalopram (CELEXA) 40 MG tablet Take 1 tablet by mouth daily.     Docusate Sodium (DSS) 100 MG CAPS Take by mouth.     furosemide (LASIX) 40 MG tablet Take 1.5 tablets (60 mg total) by mouth daily. Take extra 1/2 tablet as needed for weight gain. 150 tablet 3   HYDROcodone-acetaminophen (NORCO/VICODIN) 5-325 MG tablet Take 1 tablet by mouth every 8 (eight) hours as needed for moderate pain. 30 tablet 0   hydrOXYzine (VISTARIL) 25 MG capsule Take 25 mg by mouth 3 (three) times daily as needed for itching.     ibandronate (BONIVA) 150 MG tablet Take 150 mg by mouth every 30 (thirty) days. Take in the morning with a full glass of water, on an empty stomach, and do  not take anything else by mouth or lie down for the next 30 min.     imiquimod (ALDARA) 5 % cream Apply topically 3 (three) times a week. 12 each 1   ipratropium-albuterol (DUONEB) 0.5-2.5 (3) MG/3ML SOLN Take 3 mLs by nebulization every 4 (four) hours as needed (Wheezing).     ketoconazole (NIZORAL) 2 % shampoo Apply 1 application topically every 14 (fourteen) days.     levothyroxine (SYNTHROID) 200 MCG tablet Take 200 mcg by mouth daily before breakfast.      methocarbamol (ROBAXIN) 500 MG tablet TAKE 1 TABLET BY MOUTH EVERY 8 HOURS AS NEEDED 30 tablet 0   mexiletine (MEXITIL) 150 MG capsule TAKE 1 CAPSULE (150 MG TOTAL) BY MOUTH 3 (THREE) TIMES DAILY. 90 capsule 2   montelukast (SINGULAIR) 10 MG tablet Take 10 mg by mouth daily.     Multiple Vitamin (MULTIVITAMIN WITH MINERALS) TABS tablet Take 1 tablet daily by mouth.     nystatin (MYCOSTATIN/NYSTOP) powder Apply topically 4 (four) times daily. 60 g 6   nystatin cream (MYCOSTATIN) Apply 1 application topically 2 (two) times daily. 30 g 6   pantoprazole (PROTONIX) 40 MG tablet Take 40 mg by mouth daily.     polyvinyl alcohol (LIQUIFILM TEARS) 1.4 % ophthalmic solution Place 1 drop into both eyes daily as needed for dry eyes.      spironolactone (ALDACTONE) 25 MG tablet Take 1 tablet (25 mg total) by mouth daily. 90 tablet 0   No current facility-administered medications for this visit.    Allergies  Allergen Reactions   Tape     "thick clear plastic tape" causes blisters    Hydromorphone Hcl Itching and Rash   Macrobid [Nitrofurantoin] Rash   Morphine And Related Itching and Rash   Penicillins Hives and Swelling    1968   Valisone [Betamethasone] Rash    Social History   Socioeconomic History   Marital status: Married    Spouse name: Not on file   Number of children: Not on file   Years  of education: Not on file   Highest education level: Not on file  Occupational History   Not on file  Tobacco Use   Smoking status:  Former    Packs/day: 2.00    Years: 41.00    Total pack years: 82.00    Types: Cigarettes    Quit date: 06/05/1999    Years since quitting: 22.5   Smokeless tobacco: Never  Vaping Use   Vaping Use: Never used  Substance and Sexual Activity   Alcohol use: No   Drug use: No   Sexual activity: Not on file  Other Topics Concern   Not on file  Social History Narrative   Not on file   Social Determinants of Health   Financial Resource Strain: Not on file  Food Insecurity: Not on file  Transportation Needs: Not on file  Physical Activity: Not on file  Stress: Not on file  Social Connections: Not on file  Intimate Partner Violence: Not on file     Review of Systems: All other systems reviewed and are otherwise negative except as noted above.  Physical Exam: There were no vitals filed for this visit.  GEN- The patient is well appearing, alert and oriented x 3 today.   HEENT: normocephalic, atraumatic; sclera clear, conjunctiva pink; hearing intact; oropharynx clear; neck supple, no JVP Lymph- no cervical lymphadenopathy Lungs- Clear to ausculation bilaterally, normal work of breathing.  No wheezes, rales, rhonchi Heart- Regular rate and rhythm, no murmurs, rubs or gallops, PMI not laterally displaced GI- soft, non-tender, non-distended, bowel sounds present, no hepatosplenomegaly Extremities- no clubbing, cyanosis, or edema; DP/PT/radial pulses 2+ bilaterally MS- no significant deformity or atrophy Skin- warm and dry, no rash or lesion Psych- euthymic mood, full affect Neuro- strength and sensation are intact  EKG is ordered. Personal review of EKG from today shows ***  Additional studies reviewed include: Previous EP office notes. ***  Assessment and Plan:  1.  PVCs:  Stable to improved on recent monitor    2.  Chronic diastolic heart failure:  NYHA III symptoms Continue lasix 60 mg daily Continue spiro 25 mg daily Labs today Drinking lots of fluid. Encouraged  her to limit sodium and fluid to < 2000 mg and 2000 mL, respectively.  Echo 02/2021 LVEF 55-60%   3. HTN Stable on current regimen    4. Mild chest discomfort *** Accompanied by her SOB.  She had mild non-obstructive CAD years ago. With shared decision making will proceed first with Echo, and if consistent with chronic diastolic CHF, will work on fluid.  If symptoms worsen or become more typical, will address at that time.   Follow up with Dr. Curt Bears in 6 months   Shirley Friar, PA-C  12/19/21 2:28 PM

## 2021-12-24 ENCOUNTER — Ambulatory Visit: Payer: Medicare HMO | Admitting: Student

## 2021-12-24 DIAGNOSIS — I10 Essential (primary) hypertension: Secondary | ICD-10-CM

## 2021-12-24 DIAGNOSIS — I493 Ventricular premature depolarization: Secondary | ICD-10-CM

## 2021-12-24 DIAGNOSIS — I5032 Chronic diastolic (congestive) heart failure: Secondary | ICD-10-CM

## 2021-12-31 NOTE — Progress Notes (Signed)
PCP:  Chesley Noon, MD Primary Cardiologist: None Electrophysiologist: Will Meredith Leeds, MD   [name] is a 76 y.o. female seen today for Will Meredith Leeds, MD for routine electrophysiology followup.  Since last being seen in our clinic the patient reports doing well overall. Has had some increased swelling over the past few weeks and taking extra lasix prn. Dyspnea has been slightly worse. Attributing this to the weather.  she denies chest pain, palpitations,  PND, orthopnea, nausea, vomiting, dizziness, syncope,  weight gain, or early satiety.  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   Cancer of right breast, stage 2 ([place]) 05/28/2011   2.8 cm invasive 1 node pos ER pos S/P mastectomy 06/1995 then AC x 4 then Tam X 5    Chest pain, rule out acute myocardial infarction 04/10/2017   Chronic constipation    Chronic cystitis 04/09/2017   Cough 08/23/2007   Qualifier: Diagnosis of  By: Gwenette Greet MD, Armando Reichert    Depression    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   Dysrhythmia 2020   Had Afib   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   Fibromyalgia    GERD (gastroesophageal reflux disease)    History of adenomatous polyp of colon 09/30/2018   History of breast cancer no recurrence   1997  right breast cancer  s/p  mastectomy w/ snl dissection and chemoradiation/  2005  left breast cancer DCIS  s/p  lumpectomy and radiation   History of colon polyps    2007 hyperplagia   History of esophageal dilatation    2008   History of peptic ulcer    History of small bowel obstruction     2012   Hypothyroid 05/28/2011   Hypothyroidism    Incomplete emptying of bladder 07/30/2016   Left foot pain 12/11/2015   Overview:  Dr Cyril Mourning [facility]   Macular degeneration of right eye    Mild depression 05/28/2011   Mixed incontinence urge and stress 09/16/2011   S/p lumax desires surgery    Morbid obesity ([place]) 04/10/2011   Numbness of left foot    4 toes   Osteoporosis 12/15/2016   Overview:  Dr Cletis Media   Paget's disease of vulva [facility])    Personal history of chemotherapy 1997   Personal history of radiation therapy 2005   PONV (postoperative nausea and vomiting)    and HARD TO WAKE   Seasonal allergies    Shortness of breath    SKIN CANCER, HX OF 08/23/2007   Qualifier: Diagnosis of  By: Doy Mince LPN, Megan     SUI (stress urinary incontinence, female)    Thrombocytopenic disorder ([place]) 02/25/2017   Wears glasses    Past Surgical History:  Procedure Laterality Date   BLADDER SUSPENSION  10/13/2011   Procedure: TRANSVAGINAL TAPE (TVT) PROCEDURE;  Surgeon: Delice Lesch, MD;  Location: [facility];  Service: Gynecology;  Laterality: N/A;   BREAST BIOPSY Left 03/19/2004   BREAST LUMPECTOMY Left 03-27-2004   CARDIAC CATHETERIZATION  10-30-2000  dr Wynonia Lawman  normal coronary arteries and LVF/  ef 65-70%   CLOSED MANIPULATION  W/ OPEN REDUCTION EXCHANGE TIBIAL FEMORAL BEARING POST TOTAL LEFT KNEE  04-14-2001   CYSTOSCOPY  10/13/2011   Procedure: CYSTOSCOPY;  Surgeon: Delice Lesch, MD;  Location: [facility];  Service: Gynecology;  Laterality: Bilateral;   DIAGNOSTIC LAPAROSCOPY     Colon d/t SBO   DILATION AND CURETTAGE OF UTERUS  1968   FOOT SURGERY Left 02-01-2009   TRIPLE ARTHRODESIS   FOOT SURGERY Left 11/14   I&D left foot with woundvac placement   INCISIONAL HERNIA REPAIR  05/05/2011   Procedure: LAPAROSCOPIC INCISIONAL HERNIA;  Surgeon: Edward Jolly, MD;  Location: [facility];  Service: General;  Laterality: N/A;  LAPAROSCOPIC REPAIR INCARCERATED INCISIONAL HERNIA  WITH MESH    KNEE ARTHROSCOPY Bilateral left 01-1984 & 04-1992/   right [id number]   LAPAROSCOPIC CHOLECYSTECTOMY  06-21-2010   LEFT HEART CATH AND CORONARY ANGIOGRAPHY N/A 04/13/2017   Procedure: LEFT HEART CATH AND CORONARY ANGIOGRAPHY;  Surgeon: Jettie Booze, MD;  Location: [facility];  Service: Cardiovascular;  Laterality: N/A;   LUMBAR SPINE SURGERY  1998   L5 -- S1   MASTECTOMY Right 1997   W/  LYMPH NODE DISSECTION AND RECONSTRUCTION WITH IMPLANTS AND EXPANDER   NEGATIVE SLEEP STUDY  12/26/2015   PORT-A-CATH PLACEMENT AND REMOVAL  1997   REMOVAL RIGHT BREAST IMPLANT AND CAPSULECTOMY  06-03-1999   REVERSE SHOULDER ARTHROPLASTY Left 10/25/2019   Procedure: LEFT REVERSE SHOULDER ARTHROPLASTY;  Surgeon: Meredith Pel, MD;  Location: [facility];  Service: Orthopedics;  Laterality: Left;   REVISION TOTAL KNEE ARTHROPLASTY Left 07-07-2005  &  12-10-2001   12-10-2001  REMOVAL TOTAL KNEE/ I&D / INSERTION ANTIBIOTIC SPACER   REVISION TOTAL KNEE ARTHROPLASTY Right 07-07-2005   ROTATOR CUFF REPAIR Right    TENDON REPAIR RIGHT RING FINGER  2011   THUMB TRIGGER RELEASE Right 1993   THYROIDECTOMY, PARTIAL  1983   TONSILLECTOMY  1968   TOTAL KNEE ARTHROPLASTY Bilateral right 05-04-2000/  left 03-29-2001   VULVECTOMY  05/27/2012   Procedure: WIDE EXCISION VULVECTOMY;  Surgeon: Imagene Gurney A. Alycia Rossetti, MD;  Location: [facility];  Service: Gynecology;  Laterality: N/A;  Wide Local Excision of Vulva    VULVECTOMY N/A 11/23/2012   Procedure: WIDE LOCAL EXCISION OF THE VULVA;  Surgeon: Imagene Gurney A. Alycia Rossetti, MD;  Location: [facility];  Service: Gynecology;  Laterality: N/A;  left inferior vulvar biopsy excision left superior lesion left inferior vulvar excision   VULVECTOMY N/A 08/02/2013   Procedure: WIDE LOCAL  EXCISION VULVA;  Surgeon: Imagene Gurney A. Alycia Rossetti, MD;  Location: [facility];  Service: Gynecology;  Laterality: N/A;   VULVECTOMY Left 03/29/2014   Procedure: WIDE LOCAL EXCISION OF LEFT VULVA ;  Surgeon: Imagene Gurney A. Alycia Rossetti, MD;   Location: [facility];  Service: Gynecology;  Laterality: Left;   VULVECTOMY Bilateral 01/24/2015   Procedure: WIDE LOCAL EXCISION VULVA;  Surgeon: [name], MD;  Location: [facility];  Service: Gynecology;  Laterality: Bilateral;   VULVECTOMY PARTIAL  07-16-2010    Current Outpatient Medications  Medication Sig Dispense Refill   acetaminophen (TYLENOL) 500 MG tablet Take 1,000 mg by mouth every 6 (six) hours as needed for mild pain or moderate pain.     albuterol (PROVENTIL HFA;VENTOLIN HFA) 108 (90 BASE) MCG/ACT inhaler Inhale 2 puffs into the lungs every 4 (four) hours as needed for wheezing or shortness of breath. Reported on 08/01/2015  aspirin EC 81 MG EC tablet Take 1 tablet (81 mg total) daily by mouth. 30 tablet 0   atorvastatin (LIPITOR) 40 MG tablet TAKE 1 TABLET BY MOUTH DAILY *MUST MAKE APPT* 90 tablet 3   bisoprolol (ZEBETA) 5 MG tablet TAKE 1 TABLET BY MOUTH EVERY DAY 90 tablet 0   budesonide-formoterol (SYMBICORT) 160-4.5 MCG/ACT inhaler Inhale 2 puffs into the lungs daily as needed (Wheezing in the winter).     buPROPion (WELLBUTRIN XL) 150 MG 24 hr tablet Take 1 tablet by mouth every morning.     Calcium Carbonate-Vitamin D (OYSTER SHELL CALCIUM/D) 500-5 MG-MCG TABS Take by mouth.     celecoxib (CELEBREX) 200 MG capsule Take 1 capsule (200 mg total) by mouth daily. 30 capsule 0   Cholecalciferol (VITAMIN D3) 1000 UNITS CAPS Take 2,000 Units by mouth daily.      citalopram (CELEXA) 40 MG tablet Take 1 tablet by mouth daily.     Docusate Sodium (DSS) 100 MG CAPS Take by mouth.     furosemide (LASIX) 40 MG tablet Take 1.5 tablets (60 mg total) by mouth daily. Take extra 1/2 tablet as needed for weight gain. 150 tablet 3   HYDROcodone-acetaminophen (NORCO/VICODIN) 5-325 MG tablet Take 1 tablet by mouth every 8 (eight) hours as needed for moderate pain. 30 tablet 0   hydrOXYzine (VISTARIL) 25 MG capsule Take 25 mg by mouth 3 (three) times daily as  needed for itching.     ibandronate (BONIVA) 150 MG tablet Take 150 mg by mouth every 30 (thirty) days. Take in the morning with a full glass of water, on an empty stomach, and do not take anything else by mouth or lie down for the next 30 min.     imiquimod (ALDARA) 5 % cream Apply topically 3 (three) times a week. 12 each 1   ipratropium-albuterol (DUONEB) 0.5-2.5 (3) MG/3ML SOLN Take 3 mLs by nebulization every 4 (four) hours as needed (Wheezing).     ketoconazole (NIZORAL) 2 % shampoo Apply 1 application topically every 14 (fourteen) days.     levothyroxine (SYNTHROID) 200 MCG tablet Take 200 mcg by mouth daily before breakfast.      methocarbamol (ROBAXIN) 500 MG tablet TAKE 1 TABLET BY MOUTH EVERY 8 HOURS AS NEEDED 30 tablet 0   mexiletine (MEXITIL) 150 MG capsule TAKE 1 CAPSULE (150 MG TOTAL) BY MOUTH 3 (THREE) TIMES DAILY. 90 capsule 2   montelukast (SINGULAIR) 10 MG tablet Take 10 mg by mouth daily.     Multiple Vitamin (MULTIVITAMIN WITH MINERALS) TABS tablet Take 1 tablet daily by mouth.     nystatin (MYCOSTATIN/NYSTOP) powder Apply topically 4 (four) times daily. 60 g 6   nystatin cream (MYCOSTATIN) Apply 1 application topically 2 (two) times daily. 30 g 6   pantoprazole (PROTONIX) 40 MG tablet Take 40 mg by mouth daily.     polyvinyl alcohol (LIQUIFILM TEARS) 1.4 % ophthalmic solution Place 1 drop into both eyes daily as needed for dry eyes.      spironolactone (ALDACTONE) 25 MG tablet Take 1 tablet (25 mg total) by mouth daily. 90 tablet 0   No current facility-administered medications for this visit.    Allergies  Allergen Reactions   Tape     "thick clear plastic tape" causes blisters    Hydromorphone Hcl Itching and Rash   Macrobid [Nitrofurantoin] Rash   Morphine And Related Itching and Rash   Penicillins Hives and Swelling    1968   Valisone [Betamethasone] Rash  Social History   Socioeconomic History   Marital status: Married    Spouse name: Not on file    Number of children: Not on file   Years of education: Not on file   Highest education level: Not on file  Occupational History   Not on file  Tobacco Use   Smoking status: Former    Packs/day: 2.00    Years: 41.00    Total pack years: 82.00    Types: Cigarettes    Quit date: 06/05/1999    Years since quitting: 22.6   Smokeless tobacco: Never  Vaping Use   Vaping Use: Never used  Substance and Sexual Activity   Alcohol use: No   Drug use: No   Sexual activity: Not on file  Other Topics Concern   Not on file  Social History Narrative   Not on file   Social Determinants of Health   Financial Resource Strain: Not on file  Food Insecurity: Not on file  Transportation Needs: Not on file  Physical Activity: Not on file  Stress: Not on file  Social Connections: Not on file  Intimate Partner Violence: Not on file     Review of Systems: All other systems reviewed and are otherwise negative except as noted above.  Physical Exam: Vitals:   01/07/22 1153  BP: 118/80  Pulse: 66  SpO2: 95%  Weight: 247 lb 9.6 oz (112.3 kg)  Height: '5\' 5"'$  (1.651 m)    GEN- The patient is well appearing, alert and oriented x 3 today.   HEENT: normocephalic, atraumatic; sclera clear, conjunctiva pink; hearing intact; oropharynx clear; neck supple, no JVP Lymph- no cervical lymphadenopathy Lungs- Clear to ausculation bilaterally, normal work of breathing.  No wheezes, rales, rhonchi Heart- Regular rate and rhythm, no murmurs, rubs or gallops, PMI not laterally displaced GI- soft, non-tender, non-distended, bowel sounds present, no hepatosplenomegaly Extremities- no clubbing, cyanosis, or edema; DP/PT/radial pulses 2+ bilaterally MS- no significant deformity or atrophy Skin- warm and dry, no rash or lesion Psych- euthymic mood, full affect Neuro- strength and sensation are intact  EKG is ordered. Personal review of EKG from today shows NSR at 66 bpm  Additional studies reviewed  include: Previous EP office notes.   Assessment and Plan:  1. PVCs Stable Continue bisoprolol  2. Chronic diastolic CHF Echo 95/1884 LVEF 55-60% NYHA III symptoms Continue lasix 60 mg daily Continue spiro 25 mg daily  3. HTN Stable on current regimen   Follow up with Dr. Curt Bears in 6 months   Shirley Friar, PA-C  01/07/22 11:56 AM

## 2022-01-07 ENCOUNTER — Ambulatory Visit: Payer: Medicare HMO | Admitting: Student

## 2022-01-07 ENCOUNTER — Encounter: Payer: Self-pay | Admitting: Student

## 2022-01-07 VITALS — BP 118/80 | HR 66 | Ht 65.0 in | Wt 247.6 lb

## 2022-01-07 DIAGNOSIS — I5032 Chronic diastolic (congestive) heart failure: Secondary | ICD-10-CM | POA: Diagnosis not present

## 2022-01-07 DIAGNOSIS — I493 Ventricular premature depolarization: Secondary | ICD-10-CM | POA: Diagnosis not present

## 2022-01-07 DIAGNOSIS — I10 Essential (primary) hypertension: Secondary | ICD-10-CM | POA: Diagnosis not present

## 2022-01-07 NOTE — Patient Instructions (Addendum)
Medication Instructions:  Your physician recommends that you continue on your current medications as directed. Please refer to the Current Medication list given to you today.  *If you need a refill on your cardiac medications before your next appointment, please call your pharmacy*   Lab Work: TODAY: BMET, ProBNP  If you have labs (blood work) drawn today and your tests are completely normal, you will receive your results only by: [place] (if you have MyChart) OR A paper copy in the mail If you have any lab test that is abnormal or we need to change your treatment, we will call you to review the results.   Follow-Up: At [facility], you and your health needs are our priority.  As part of our continuing mission to provide you with exceptional heart care, we have created designated Provider Care Teams.  These Care Teams include your primary Cardiologist (physician) and Advanced Practice Providers (APPs -  Physician Assistants and Nurse Practitioners) who all work together to provide you with the care you need, when you need it.   Your next appointment:   6 month(s)  The format for your next appointment:   In Person  Provider:   Allegra Lai, MD

## 2022-01-08 ENCOUNTER — Encounter: Payer: Self-pay | Admitting: Gynecologic Oncology

## 2022-01-08 LAB — BASIC METABOLIC PANEL
BUN/Creatinine Ratio: 14 (ref 12–28)
BUN: 12 mg/dL (ref 8–27)
CO2: 23 mmol/L (ref 20–29)
Calcium: 9.7 mg/dL (ref 8.7–10.3)
Chloride: 102 mmol/L (ref 96–106)
Creatinine, Ser: 0.84 mg/dL (ref 0.57–1.00)
Glucose: 93 mg/dL (ref 70–99)
Potassium: 4.8 mmol/L (ref 3.5–5.2)
Sodium: 142 mmol/L (ref 134–144)
eGFR: 72 mL/min/{1.73_m2} (ref >59)

## 2022-01-08 LAB — PRO B NATRIURETIC PEPTIDE: NT-Pro BNP: 190 pg/mL (ref 0–738)

## 2022-01-10 ENCOUNTER — Other Ambulatory Visit: Payer: Self-pay

## 2022-01-10 ENCOUNTER — Encounter: Payer: Self-pay | Admitting: Gynecologic Oncology

## 2022-01-10 ENCOUNTER — Inpatient Hospital Stay: Payer: Medicare HMO | Attending: Gynecologic Oncology | Admitting: Gynecologic Oncology

## 2022-01-10 VITALS — BP 151/58 | HR 74 | Temp 97.8°F | Resp 16 | Ht 65.5 in | Wt 250.0 lb

## 2022-01-10 DIAGNOSIS — C4499 Other specified malignant neoplasm of skin, unspecified: Secondary | ICD-10-CM

## 2022-01-10 DIAGNOSIS — C519 Malignant neoplasm of vulva, unspecified: Secondary | ICD-10-CM | POA: Insufficient documentation

## 2022-01-10 DIAGNOSIS — R600 Localized edema: Secondary | ICD-10-CM | POA: Diagnosis not present

## 2022-01-10 DIAGNOSIS — Z9079 Acquired absence of other genital organ(s): Secondary | ICD-10-CM | POA: Diagnosis not present

## 2022-01-10 DIAGNOSIS — N9089 Other specified noninflammatory disorders of vulva and perineum: Secondary | ICD-10-CM

## 2022-01-10 NOTE — Progress Notes (Signed)
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 Some of the surgical margins were positive. Her postoperative course was uncomplicated. She was last seen by our service in 12/14. At that time there was no evidence of any lesions consistent with Paget's. However, there is considerable amount of excoriations throughout the entire vulvar and medial thighs consistent with chronic vulvitis.In December 2013 she had a hernia repair due to bowel obstruction by Dr. Excell Seltzer with permanent mesh. In May 2013 she also had a TVT bladder by Dr. Everett Graff.    She underwent a wide local excision of the left vulva on January 2 of year 2014. Operative findings included 2 separate 3 x 3 and 1.2 cm lesions consistent with Paget's on the left.  Pathology revealed 2 foci of extramammary Paget's disease extending to the left inked margin in the right and anterior tip margins at multiple foci. The posterior margin was negative for malignancy.    On 11/23/2012 showed a repeat wide local excision of vulva x2 with the posterior fourchette biopsy. Findings revealed along the left labia minora/vagina is a Paget's disease measuring approximately 2.5 x 1 cm in the superior aspect of her prior left vulvar excision. The inferior aspect of a similar lesion. Biopsy and excision was performed in this area showing extramammary paget disease in all  sites.   She had been seen in January of 2015. At that time exam was concerning for Paget's.    On 08/02/2013 she underwent wide local excisions of the vulva x2. Operative findings included pale pink raised lesion measuring 1 x 0.5 cm the left upper vulva near the prior excision site. There is a pale raised lesion measuring 1.5 x 1 cm on the left crossing midline perineal body. Pathology revealed left superior vulvar excision with extramammary Paget's disease extending to the 9:00 margin. The other margins were negative for tumor. Of the left of midline lesion she extramammary Paget's disease extending to the 12:00 margin and focally at the 6:00 margin but otherwise negative margins.    She was seen on March 16, 2014 at which time there was an area on the left vulva with hyperkeratosis. A biopsy of that area was performed consistent with Paget's disease. On March 29, 2014 she underwent wide local excision of the vulva. On exam she had a 1 cm left vulvar lesion just below the left labia minora. She underwent a wide local excision. He received a 3.7 x 1.7 x 0.3 cm lesion. There was extramammary Paget's disease extending to the 6 to 9:00 margin in the 12:00 margin. This is despite grossly negative margins on physical examination.    She had an excision in November, 2015 that revealed extramammary pagets disease, extending to 6-9 o'clock and 12 o'clock margins.    She was seen in February 2016 at which time there was concern for recurrent  disease. She decided to proceed with Aldara therapy. She used for approximate 4 weeks and then decided to stop it secondary to the expense of the medication. She then started using the clobetasol. While she was using the Aldara all the pruritus in her vulva resolved itself. Since she stopped it and was using the steroid cream the pruritus returned.   She was seen in May 2016 at which time she has small area consistent with Paget's on the right labia minora and a larger  patch on the left. Biopsy on the right revealed extramammary Paget's. After discussion she wished to try the Aldara again.    She was seen December 27, 2014 at which time there was evidence clearly a Paget's disease and she scheduled for surgery. On January 24, 2015 she underwent bilateral wide local excisions of the vulva. On the right vulva there was a 2 cm patch of Paget's disease in the mid labia majora. On the left side. The remaining labia minora in the area prior to the excision are smaller lesion consistent with Paget's disease. Pathology confirmed pagets with positive margins.    The patient was taken to the operating room on 01/31/2016 for preoperative mapping of her lesion for her planned wide local excision of the left vulva and smaller excision on the right. Operative findings at that time were notable for surgically absent inferior aspect of the left labium minora and majora. Multiple areas with white flaky skin changes were biopsied and the abnormal-appearing tissue. Changes noted in the area of the clitoris and perineum and at the introitus just distal to the hymen. Biopsies were collected on all of the sites. Pagets disease was confirmed with positive margins.    She was evaluated in September, 2020 and biopsy of the vulva confirmed recurrent Pagets disease. She underwent bilateral simple partial vulvectomy on 03/04/19 with plastic surgery reconstruction (gracilis flap). Pathology confirmed recurrent pagets with positive margins bilaterally.    In June, 2021 she had a vulvar biopsy positive for pagets which was treated with Imiquimod by Dr Alycia Rossetti at [facility].  Interval History: Patient reports overall doing well.  She has had mild intermittent vulvar pruritus, using nystatin.  Was recently treated for urinary tract infection.  Denies any bleeding or discharge.  Endorses having a little bit of dizziness last night.  Notes having left leg edema and pain on the top side of her leg.  Took 2  diuretic pills this morning.  Recently saw her cardiologist.  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   Cancer of right breast, stage 2 ([place]) 05/28/2011   2.8 cm invasive 1 node pos ER pos S/P mastectomy 06/1995 then AC x 4 then Tam X 5    Chest pain, rule out acute myocardial infarction 04/10/2017   Chronic constipation    Chronic cystitis 04/09/2017   Cough 08/23/2007   Qualifier: Diagnosis of  By: Gwenette Greet MD, Armando Reichert    Depression    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   Dysrhythmia 2020  Had Afib   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   Fibromyalgia    GERD (gastroesophageal reflux disease)    History of adenomatous polyp of colon 09/30/2018   History of breast cancer no recurrence   1997  right breast cancer  s/p  mastectomy w/ snl dissection and chemoradiation/  2005  left breast cancer DCIS  s/p  lumpectomy and radiation   History of colon polyps    2007 hyperplagia   History of esophageal dilatation    2008   History of peptic ulcer    History of small bowel obstruction    2012   Hypothyroid 05/28/2011   Hypothyroidism    Incomplete emptying of bladder 07/30/2016   Left foot pain 12/11/2015   Overview:  Dr Cyril Mourning [facility]   Macular degeneration of right eye    Mild depression 05/28/2011   Mixed incontinence urge and stress 09/16/2011   S/p lumax desires surgery    Morbid obesity ([place]) 04/10/2011   Numbness of left foot    4 toes   Osteoporosis 12/15/2016   Overview:  Dr Cletis Media   Paget's disease of vulva [facility])    Personal history of  chemotherapy 1997   Personal history of radiation therapy 2005   PONV (postoperative nausea and vomiting)    and HARD TO WAKE   Seasonal allergies    Shortness of breath    SKIN CANCER, HX OF 08/23/2007   Qualifier: Diagnosis of  By: Doy Mince LPN, Megan     SUI (stress urinary incontinence, female)    Thrombocytopenic disorder ([place]) 02/25/2017   Wears glasses     Past Surgical History:  Procedure Laterality Date   BLADDER SUSPENSION  10/13/2011   Procedure: TRANSVAGINAL TAPE (TVT) PROCEDURE;  Surgeon: Delice Lesch, MD;  Location: [facility];  Service: Gynecology;  Laterality: N/A;   BREAST BIOPSY Left 03/19/2004   BREAST LUMPECTOMY Left 03-27-2004   CARDIAC CATHETERIZATION  10-30-2000  dr Wynonia Lawman   normal coronary arteries and LVF/  ef 65-70%   CLOSED MANIPULATION  W/ OPEN REDUCTION EXCHANGE TIBIAL FEMORAL BEARING POST TOTAL LEFT KNEE  04-14-2001   CYSTOSCOPY  10/13/2011   Procedure: CYSTOSCOPY;  Surgeon: Delice Lesch, MD;  Location: [facility];  Service: Gynecology;  Laterality: Bilateral;   DIAGNOSTIC LAPAROSCOPY     Colon d/t SBO   DILATION AND CURETTAGE OF UTERUS  1968   FOOT SURGERY Left 02-01-2009   TRIPLE ARTHRODESIS   FOOT SURGERY Left 11/14   I&D left foot with woundvac placement   INCISIONAL HERNIA REPAIR  05/05/2011   Procedure: LAPAROSCOPIC INCISIONAL HERNIA;  Surgeon: Edward Jolly, MD;  Location: [facility];  Service: General;  Laterality: N/A;  LAPAROSCOPIC REPAIR INCARCERATED INCISIONAL HERNIA  WITH MESH   KNEE ARTHROSCOPY Bilateral left 01-1984 & 04-1992/   right [id number]   LAPAROSCOPIC CHOLECYSTECTOMY  06-21-2010   LEFT HEART CATH AND CORONARY ANGIOGRAPHY N/A 04/13/2017   Procedure: LEFT HEART CATH AND CORONARY ANGIOGRAPHY;  Surgeon: Jettie Booze, MD;  Location: [facility];  Service: Cardiovascular;  Laterality: N/A;   LUMBAR SPINE SURGERY  1998   L5 -- S1   MASTECTOMY Right 1997   W/  LYMPH NODE DISSECTION AND RECONSTRUCTION WITH IMPLANTS AND  EXPANDER   NEGATIVE SLEEP STUDY  12/26/2015   PORT-A-CATH PLACEMENT AND REMOVAL  1997   REMOVAL RIGHT BREAST IMPLANT AND CAPSULECTOMY  06-03-1999   REVERSE SHOULDER ARTHROPLASTY Left 10/25/2019   Procedure: LEFT REVERSE SHOULDER ARTHROPLASTY;  Surgeon: Meredith Pel,  MD;  Location: [facility];  Service: Orthopedics;  Laterality: Left;   REVISION TOTAL KNEE ARTHROPLASTY Left 07-07-2005  &  12-10-2001   12-10-2001  REMOVAL TOTAL KNEE/ I&D / INSERTION ANTIBIOTIC SPACER   REVISION TOTAL KNEE ARTHROPLASTY Right 07-07-2005   ROTATOR CUFF REPAIR Right    TENDON REPAIR RIGHT RING FINGER  2011   THUMB TRIGGER RELEASE Right 1993   THYROIDECTOMY, PARTIAL  1983   TONSILLECTOMY  1968   TOTAL KNEE ARTHROPLASTY Bilateral right 05-04-2000/  left 03-29-2001   VULVECTOMY  05/27/2012   Procedure: WIDE EXCISION VULVECTOMY;  Surgeon: Imagene Gurney A. Alycia Rossetti, MD;  Location: [facility];  Service: Gynecology;  Laterality: N/A;  Wide Local Excision of Vulva    VULVECTOMY N/A 11/23/2012   Procedure: WIDE LOCAL EXCISION OF THE VULVA;  Surgeon: Imagene Gurney A. Alycia Rossetti, MD;  Location: [facility];  Service: Gynecology;  Laterality: N/A;  left inferior vulvar biopsy excision left superior lesion left inferior vulvar excision   VULVECTOMY N/A 08/02/2013   Procedure: WIDE LOCAL  EXCISION VULVA;  Surgeon: Imagene Gurney A. Alycia Rossetti, MD;  Location: [facility];  Service: Gynecology;  Laterality: N/A;   VULVECTOMY Left 03/29/2014   Procedure: WIDE LOCAL EXCISION OF LEFT VULVA ;  Surgeon: Imagene Gurney A. Alycia Rossetti, MD;  Location: [facility];  Service: Gynecology;  Laterality: Left;   VULVECTOMY Bilateral 01/24/2015   Procedure: WIDE LOCAL EXCISION VULVA;  Surgeon: [name], MD;  Location: [facility];  Service: Gynecology;  Laterality: Bilateral;   VULVECTOMY PARTIAL  07-16-2010    Family History  Problem Relation Age of Onset   Kidney cancer Father 58       metastatic    Alzheimer's disease Mother    Other Mother        ? Cancer   Heart  attack Maternal Grandmother    Alcohol abuse Brother    Bladder Cancer Brother        dx 76s   Breast cancer Paternal Aunt        dx after 14   Bladder Cancer Paternal Aunt        dx after 17   Breast cancer Cousin        two paternal female cousins; unknown age of diagnosis   Cancer Maternal Aunt        ? gallbladder; dx 52s   Cancer Maternal Uncle        unknown type; ? pancreatic ? colon; dx after 56   Brain cancer Paternal Uncle        unknown age of dx   Melanoma Brother        dx late 67s-early 60s   Bone cancer Maternal Uncle        dx after 4   Leukemia Maternal Uncle        dx after 83   Bone cancer Maternal Uncle        dx after 30   Lung cancer Maternal Uncle        dx after 9   Brain cancer Cousin        two maternal female cousins; dx after 40   Stomach cancer Paternal Aunt        unknown age of dx    Social History   Socioeconomic History   Marital status: Married    Spouse name: Not on file   Number of children: Not on file   Years of education: Not on file   Highest education level: Not on file  Occupational History   Not on file  Tobacco Use   Smoking status: Former    Packs/day: 2.00    Years: 41.00    Total pack years: 82.00    Types: Cigarettes    Quit date: 06/05/1999    Years since quitting: 22.6   Smokeless tobacco: Never  Vaping Use   Vaping Use: Never used  Substance and Sexual Activity   Alcohol use: No   Drug use: No   Sexual activity: Not Currently    Birth control/protection: Post-menopausal  Other Topics Concern   Not on file  Social History Narrative   Not on file   Social Determinants of Health   Financial Resource Strain: Not on file  Food Insecurity: Not on file  Transportation Needs: Not on file  Physical Activity: Not on file  Stress: Not on file  Social Connections: Not on file    Current Medications:  Current Outpatient Medications:    acetaminophen (TYLENOL) 500 MG tablet, Take 1,000 mg by mouth every 6  (six) hours as needed for mild pain or moderate pain., Disp: , Rfl:    albuterol (PROVENTIL HFA;VENTOLIN HFA) 108 (90 BASE) MCG/ACT inhaler, Inhale 2 puffs into the lungs every 4 (four) hours as needed for wheezing or shortness of breath. Reported on 08/01/2015, Disp: , Rfl:    aspirin EC 81 MG EC tablet, Take 1 tablet (81 mg total) daily by mouth., Disp: 30 tablet, Rfl: 0   atorvastatin (LIPITOR) 40 MG tablet, TAKE 1 TABLET BY MOUTH DAILY *MUST MAKE APPT*, Disp: 90 tablet, Rfl: 3   bisoprolol (ZEBETA) 5 MG tablet, TAKE 1 TABLET BY MOUTH EVERY DAY, Disp: 90 tablet, Rfl: 0   budesonide-formoterol (SYMBICORT) 160-4.5 MCG/ACT inhaler, Inhale 2 puffs into the lungs daily as needed (Wheezing in the winter)., Disp: , Rfl:    buPROPion (WELLBUTRIN XL) 150 MG 24 hr tablet, Take 1 tablet by mouth every morning., Disp: , Rfl:    Calcium Carbonate-Vitamin D (OYSTER SHELL CALCIUM/D) 500-5 MG-MCG TABS, Take by mouth., Disp: , Rfl:    celecoxib (CELEBREX) 200 MG capsule, Take 1 capsule (200 mg total) by mouth daily., Disp: 30 capsule, Rfl: 0   Cholecalciferol (VITAMIN D3) 1000 UNITS CAPS, Take 2,000 Units by mouth daily. , Disp: , Rfl:    citalopram (CELEXA) 40 MG tablet, Take 1 tablet by mouth daily., Disp: , Rfl:    furosemide (LASIX) 40 MG tablet, Take 1.5 tablets (60 mg total) by mouth daily. Take extra 1/2 tablet as needed for weight gain., Disp: 150 tablet, Rfl: 3   hydrOXYzine (VISTARIL) 25 MG capsule, Take 25 mg by mouth 3 (three) times daily as needed for itching., Disp: , Rfl:    ibandronate (BONIVA) 150 MG tablet, Take 150 mg by mouth every 30 (thirty) days. Take in the morning with a full glass of water, on an empty stomach, and do not take anything else by mouth or lie down for the next 30 min., Disp: , Rfl:    imiquimod (ALDARA) 5 % cream, Apply topically 3 (three) times a week., Disp: 12 each, Rfl: 1   ipratropium-albuterol (DUONEB) 0.5-2.5 (3) MG/3ML SOLN, Take 3 mLs by nebulization every 4 (four)  hours as needed (Wheezing)., Disp: , Rfl:    ketoconazole (NIZORAL) 2 % shampoo, Apply 1 application topically every 14 (fourteen) days., Disp: , Rfl:    levothyroxine (SYNTHROID) 200 MCG tablet, Take 200 mcg by mouth daily before breakfast. , Disp: , Rfl:    methocarbamol (ROBAXIN)  500 MG tablet, TAKE 1 TABLET BY MOUTH EVERY 8 HOURS AS NEEDED, Disp: 30 tablet, Rfl: 0   montelukast (SINGULAIR) 10 MG tablet, Take 10 mg by mouth daily., Disp: , Rfl:    Multiple Vitamin (MULTIVITAMIN WITH MINERALS) TABS tablet, Take 1 tablet daily by mouth., Disp: , Rfl:    nystatin (MYCOSTATIN/NYSTOP) powder, Apply topically 4 (four) times daily., Disp: 60 g, Rfl: 6   nystatin cream (MYCOSTATIN), Apply 1 application topically 2 (two) times daily., Disp: 30 g, Rfl: 6   pantoprazole (PROTONIX) 40 MG tablet, Take 40 mg by mouth daily., Disp: , Rfl:    polyvinyl alcohol (LIQUIFILM TEARS) 1.4 % ophthalmic solution, Place 1 drop into both eyes daily as needed for dry eyes. , Disp: , Rfl:    spironolactone (ALDACTONE) 25 MG tablet, Take 1 tablet (25 mg total) by mouth daily., Disp: 90 tablet, Rfl: 0  Review of Systems: Denies appetite changes, fevers, chills, fatigue, unexplained weight changes. Denies hearing loss, neck lumps or masses, mouth sores, ringing in ears or voice changes. Denies cough or wheezing.   Denies chest pain or palpitations.  Denies abdominal distention, pain, blood in stools, constipation, diarrhea, nausea, vomiting, or early satiety. Denies pain with intercourse, dysuria, frequency, hematuria or incontinence. Denies hot flashes, pelvic pain, vaginal bleeding or vaginal discharge.   Denies joint pain, back pain or muscle pain/cramps. Denies rash, or wounds. Denies headaches, numbness or seizures. Denies swollen lymph nodes or glands, denies easy bruising or bleeding. Denies anxiety, depression, confusion, or decreased concentration.  Physical Exam: BP (!) 151/58 (BP Location: Left Arm, Patient  Position: Sitting) Comment: MD notified  Pulse 74   Temp 97.8 F (36.6 C) (Oral)   Resp 16   Ht 5' 5.5" (1.664 m)   Wt 250 lb (113.4 kg)   BMI 40.97 kg/m  General: Alert, oriented, no acute distress. HEENT: Normocephalic, atraumatic, sclera anicteric. Chest: Unlabored breathing on room air Extremities: Grossly normal range of motion.  Warm, well perfused.  1-2+ edema left lower extremity, very mild erythema along the anterior lower leg. Skin: No rashes or lesions noted. Lymphatics: No cervical, supraclavicular, or inguinal adenopathy. GU: Vulva notable for significant anatomic changes related to prior surgeries.  No areas tender to palpation.  There are 2 areas of very mild desquamation and erythema, which I suspect represent Paget's disease along the right inner labia and left inner labia at approximately 9 and 3:00.  No other significant areas of erythema noted.    Laboratory & Radiologic Studies: None new  Assessment & Plan: [name] is a 76 y.o. woman with a long history of Paget's disease of the vulva.   Biopsy deferred today.  Given exam findings and symptoms, I suspect that 2 areas I see on either side of her labia represent Paget's disease.  We will plan for treatment with Aldara.  I have asked her to use Aldara on both areas 3 times a week at night.  She will do this until I see her back for follow-up in 3 months.  She will call if she has any worsening or new symptoms before then.  Given her unilateral lower extremity edema and pain, recommended Doppler to rule out DVT.  This was ordered and scheduled today.  Encouraged her to reach back out to her primary care provider and cardiologist.  24 minutes of total time was spent for this patient encounter, including preparation, face-to-face counseling with the patient and coordination of care, and documentation of the  encounter.  Jeral Pinch, MD  Division of Gynecologic Oncology  Department of Obstetrics and Gynecology   [facility]

## 2022-01-10 NOTE — Patient Instructions (Addendum)
It was good to see you today.  I would like you to treat the 2 spots on your vulva with Aldara until the next time I see you (3x a week in the evening).   I will see you for follow-up in 3 months.  Please call with any new or worsening symptoms.

## 2022-01-13 ENCOUNTER — Telehealth: Payer: Self-pay

## 2022-01-13 ENCOUNTER — Ambulatory Visit (HOSPITAL_COMMUNITY)
Admission: RE | Admit: 2022-01-13 | Discharge: 2022-01-13 | Disposition: A | Discharge status: Home / Self Care | Payer: Medicare HMO | Source: Ambulatory Visit | Attending: Gynecologic Oncology | Admitting: Gynecologic Oncology

## 2022-01-13 DIAGNOSIS — R600 Localized edema: Secondary | ICD-10-CM | POA: Diagnosis present

## 2022-01-13 NOTE — Telephone Encounter (Signed)
Told Ms [name] that the Doppler study preliminary results show no signs of a DVT per Dr. Berline Lopes. Will let her know final results when available.  Ms [name] stated that she needs the Aldara called in for her at [place].. She cannot find the medication from previous use in December 2022.

## 2022-01-13 NOTE — Progress Notes (Signed)
Left lower extremity venous duplex completed. Refer to "CV Proc" under chart review to view preliminary results.  01/13/2022 1:35 PM Kelby Aline., MHA, RVT, RDCS, RDMS

## 2022-01-14 ENCOUNTER — Other Ambulatory Visit: Payer: Self-pay | Admitting: Gynecologic Oncology

## 2022-01-14 DIAGNOSIS — C4499 Other specified malignant neoplasm of skin, unspecified: Secondary | ICD-10-CM

## 2022-01-14 MED ORDER — IMIQUIMOD 5 % EX CREA: TOPICAL_CREAM | CUTANEOUS | 3 refills | Status: DC

## 2022-01-14 NOTE — Telephone Encounter (Signed)
Per [name] NP, pt is aware of negative for DVT doppler. This was the final report. Pt also aware the refill for the Aldara cream has been sent to her pharmacy on file. She was thankful for the call.

## 2022-01-27 ENCOUNTER — Other Ambulatory Visit: Payer: Self-pay | Admitting: Cardiology

## 2022-02-01 ENCOUNTER — Other Ambulatory Visit: Payer: Self-pay | Admitting: Cardiology

## 2022-02-28 ENCOUNTER — Other Ambulatory Visit: Payer: Self-pay | Admitting: Cardiology

## 2022-04-15 ENCOUNTER — Inpatient Hospital Stay: Payer: Medicare HMO | Admitting: Gynecologic Oncology

## 2022-05-08 ENCOUNTER — Encounter: Payer: Self-pay | Admitting: Gynecologic Oncology

## 2022-05-09 ENCOUNTER — Encounter: Payer: Self-pay | Admitting: Gynecologic Oncology

## 2022-05-09 ENCOUNTER — Inpatient Hospital Stay: Payer: Medicare HMO | Attending: Gynecologic Oncology | Admitting: Gynecologic Oncology

## 2022-05-09 ENCOUNTER — Other Ambulatory Visit: Payer: Self-pay

## 2022-05-09 VITALS — BP 144/58 | HR 70 | Temp 98.5°F | Resp 17 | Ht 65.0 in | Wt 250.0 lb

## 2022-05-09 DIAGNOSIS — C4499 Other specified malignant neoplasm of skin, unspecified: Secondary | ICD-10-CM

## 2022-05-09 DIAGNOSIS — Z9079 Acquired absence of other genital organ(s): Secondary | ICD-10-CM | POA: Insufficient documentation

## 2022-05-09 DIAGNOSIS — C519 Malignant neoplasm of vulva, unspecified: Secondary | ICD-10-CM | POA: Diagnosis present

## 2022-05-09 DIAGNOSIS — N9089 Other specified noninflammatory disorders of vulva and perineum: Secondary | ICD-10-CM

## 2022-05-09 DIAGNOSIS — B379 Candidiasis, unspecified: Secondary | ICD-10-CM

## 2022-05-09 DIAGNOSIS — B3731 Acute candidiasis of vulva and vagina: Secondary | ICD-10-CM | POA: Diagnosis not present

## 2022-05-09 MED ORDER — NYSTATIN 100000 UNIT/GM EX POWD: 1.0000 | Freq: Three times a day (TID) | CUTANEOUS | 3 refills | Status: AC

## 2022-05-09 MED ORDER — NYSTATIN 100000 UNIT/GM EX CREA: 1.0000 | TOPICAL_CREAM | Freq: Two times a day (BID) | CUTANEOUS | 6 refills | Status: DC

## 2022-05-09 MED ORDER — IMIQUIMOD 5 % EX CREA: TOPICAL_CREAM | CUTANEOUS | 3 refills | Status: AC

## 2022-05-09 NOTE — Patient Instructions (Signed)
It was good to see you today.  I will call you with your biopsy results next week.  Please plan to use the Aldara cream on both labia, where we pointed out today.  You will put the cream on in the evening 3 times a week from now until I see you next.  We will plan on a follow-up visit in 3 months.  If the biopsy shows something different than Paget's, I will call so that we talk about treatment options.

## 2022-05-09 NOTE — Progress Notes (Signed)
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 Some of the surgical margins were positive. Her postoperative course was uncomplicated. She was last seen by our service in 12/14. At that time there was no evidence of any lesions consistent with Paget's. However, there is considerable amount of excoriations throughout the entire vulvar and medial thighs consistent with chronic vulvitis.In December 2013 she had a hernia repair due to bowel obstruction by Dr. Excell Seltzer with permanent mesh. In May 2013 she also had a TVT bladder by Dr. Everett Graff.    She underwent a wide local excision of the left vulva on January 2 of year 2014. Operative findings included 2 separate 3 x 3 and 1.2 cm lesions consistent with Paget's on the left.  Pathology revealed 2 foci of extramammary Paget's disease extending to the left inked margin in the right and anterior tip margins at multiple foci. The posterior margin was negative for malignancy.   On 11/23/2012 showed a repeat wide local excision of vulva x2 with the posterior fourchette biopsy. Findings revealed along the left labia minora/vagina is a Paget's disease measuring approximately 2.5 x 1 cm in the superior aspect of her prior left vulvar excision. The inferior aspect of a similar lesion. Biopsy and excision was performed in this area showing extramammary paget disease in all  sites.   She had been seen in January of 2015. At that time exam was concerning for Paget's.    On 08/02/2013 she underwent wide local excisions of the vulva x2. Operative findings included pale pink raised lesion measuring 1 x 0.5 cm the left upper vulva near the prior excision site. There is a pale raised lesion measuring 1.5 x 1 cm on the left crossing midline perineal body. Pathology revealed left superior vulvar excision with extramammary Paget's disease extending to the 9:00 margin. The other margins were negative for tumor. Of the left of midline lesion she extramammary Paget's disease extending to the 12:00 margin and focally at the 6:00 margin but otherwise negative margins.    She was seen on March 16, 2014 at which time there was an area on the left vulva with hyperkeratosis. A biopsy of that area was performed consistent with Paget's disease. On March 29, 2014 she underwent wide local excision of the vulva. On exam she had a 1 cm left vulvar lesion just below the left labia minora. She underwent a wide local excision. He received a 3.7 x 1.7 x 0.3 cm lesion. There was extramammary Paget's disease extending to the 6 to 9:00 margin in the 12:00 margin. This is despite grossly negative margins on physical examination.    She had an excision in November, 2015 that revealed extramammary pagets disease, extending to 6-9 o'clock and 12 o'clock margins.    She was seen in February 2016 at which time there was concern for recurrent  disease. She decided to proceed with Aldara therapy. She used for approximate 4 weeks and then decided to stop it secondary to the expense of the medication. She then started using the clobetasol. While she was using the Aldara all the pruritus in her vulva resolved itself. Since she stopped it and was using the steroid cream the pruritus returned.   She was seen in May 2016 at which time she has small area consistent with Paget's on the right labia minora and a larger  patch on the left. Biopsy on the right revealed extramammary Paget's. After discussion she wished to try the Aldara again.    She was seen December 27, 2014 at which time there was evidence clearly a Paget's disease and she scheduled for surgery. On January 24, 2015 she underwent bilateral wide local excisions of the vulva. On the right vulva there was a 2 cm patch of Paget's disease in the mid labia majora. On the left side. The remaining labia minora in the area prior to the excision are smaller lesion consistent with Paget's disease. Pathology confirmed pagets with positive margins.    The patient was taken to the operating room on 01/31/2016 for preoperative mapping of her lesion for her planned wide local excision of the left vulva and smaller excision on the right. Operative findings at that time were notable for surgically absent inferior aspect of the left labium minora and majora. Multiple areas with white flaky skin changes were biopsied and the abnormal-appearing tissue. Changes noted in the area of the clitoris and perineum and at the introitus just distal to the hymen. Biopsies were collected on all of the sites. Pagets disease was confirmed with positive margins.    She was evaluated in September, 2020 and biopsy of the vulva confirmed recurrent Pagets disease. She underwent bilateral simple partial vulvectomy on 03/04/19 with plastic surgery reconstruction (gracilis flap). Pathology confirmed recurrent pagets with positive margins bilaterally.    In June, 2021 she had a vulvar biopsy positive for pagets which was treated with Imiquimod by Dr Alycia Rossetti at [facility].  Interval History: Reports doing well.  Unfortunately, had run out of her Aldara cream at the last visit but did not call to ask for refill to be sent.  She denies any vaginal bleeding or discharge.  She has occasional vulvar burning, usually related to urination.  Denies any new vulvar pruritus, bleeding, or discharge.  Endorses  some low back pain.  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   Cancer of right breast, stage 2 ([place]) 05/28/2011   2.8 cm invasive 1 node pos ER pos S/P mastectomy 06/1995 then AC x 4 then Tam X 5    Chest pain, rule out acute myocardial infarction 04/10/2017   Chronic constipation    Chronic cystitis 04/09/2017   Cough 08/23/2007   Qualifier: Diagnosis of  By: Gwenette Greet MD, Armando Reichert    Depression    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   Dysrhythmia 2020   Had Afib  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   Fibromyalgia    GERD (gastroesophageal reflux disease)    History of adenomatous polyp of colon 09/30/2018   History of breast cancer no recurrence   1997  right breast cancer  s/p  mastectomy w/ snl dissection and chemoradiation/  2005  left breast cancer DCIS  s/p  lumpectomy and radiation   History of colon polyps    2007 hyperplagia   History of esophageal dilatation    2008   History of peptic ulcer    History of small bowel obstruction    2012   Hypothyroid 05/28/2011   Hypothyroidism    Incomplete emptying of bladder 07/30/2016   Left foot pain 12/11/2015   Overview:  Dr Cyril Mourning [facility]   Macular degeneration of right eye    Mild depression 05/28/2011   Mixed incontinence urge and stress 09/16/2011   S/p lumax desires surgery    Morbid obesity ([place]) 04/10/2011   Numbness of left foot    4 toes   Osteoporosis 12/15/2016   Overview:  Dr Cletis Media   Paget's disease of vulva [facility])    Personal history of chemotherapy 1997   Personal history of radiation  therapy 2005   PONV (postoperative nausea and vomiting)    and HARD TO WAKE   Seasonal allergies    Shortness of breath    SKIN CANCER, HX OF 08/23/2007   Qualifier: Diagnosis of  By: Doy Mince LPN, Megan     SUI (stress urinary incontinence, female)    Thrombocytopenic disorder ([place]) 02/25/2017   Wears glasses     Past Surgical History:  Procedure Laterality Date   BLADDER SUSPENSION  10/13/2011   Procedure: TRANSVAGINAL TAPE (TVT) PROCEDURE;  Surgeon: Delice Lesch, MD;  Location: [facility];  Service: Gynecology;  Laterality: N/A;   BREAST BIOPSY Left 03/19/2004   BREAST LUMPECTOMY Left 03-27-2004   CARDIAC CATHETERIZATION  10-30-2000  dr Wynonia Lawman   normal coronary arteries and LVF/  ef 65-70%   CLOSED MANIPULATION  W/ OPEN REDUCTION EXCHANGE TIBIAL FEMORAL BEARING POST TOTAL LEFT KNEE  04-14-2001   CYSTOSCOPY  10/13/2011   Procedure: CYSTOSCOPY;  Surgeon: Delice Lesch, MD;  Location: [facility];  Service: Gynecology;  Laterality: Bilateral;   DIAGNOSTIC LAPAROSCOPY     Colon d/t SBO   DILATION AND CURETTAGE OF UTERUS  1968   FOOT SURGERY Left 02-01-2009   TRIPLE ARTHRODESIS   FOOT SURGERY Left 11/14   I&D left foot with woundvac placement   INCISIONAL HERNIA REPAIR  05/05/2011   Procedure: LAPAROSCOPIC INCISIONAL HERNIA;  Surgeon: Edward Jolly, MD;  Location: [facility];  Service: General;  Laterality: N/A;  LAPAROSCOPIC REPAIR INCARCERATED INCISIONAL HERNIA  WITH MESH   KNEE ARTHROSCOPY Bilateral left 01-1984 & 04-1992/   right [id number]   LAPAROSCOPIC CHOLECYSTECTOMY  06-21-2010   LEFT HEART CATH AND CORONARY ANGIOGRAPHY N/A 04/13/2017   Procedure: LEFT HEART CATH AND CORONARY ANGIOGRAPHY;  Surgeon: Jettie Booze, MD;  Location: [facility];  Service: Cardiovascular;  Laterality: N/A;   LUMBAR SPINE SURGERY  1998   L5 -- S1   MASTECTOMY Right 1997   W/  LYMPH NODE DISSECTION AND RECONSTRUCTION WITH IMPLANTS AND EXPANDER   NEGATIVE SLEEP STUDY  12/26/2015    PORT-A-CATH PLACEMENT AND REMOVAL  1997   REMOVAL RIGHT BREAST IMPLANT AND CAPSULECTOMY  06-03-1999   REVERSE SHOULDER ARTHROPLASTY Left 10/25/2019   Procedure: LEFT REVERSE SHOULDER ARTHROPLASTY;  Surgeon: Meredith Pel, MD;  Location: [facility]  OR;  Service: Orthopedics;  Laterality: Left;   REVISION TOTAL KNEE ARTHROPLASTY Left 07-07-2005  &  12-10-2001   12-10-2001  REMOVAL TOTAL KNEE/ I&D / INSERTION ANTIBIOTIC SPACER   REVISION TOTAL KNEE ARTHROPLASTY Right 07-07-2005   ROTATOR CUFF REPAIR Right    TENDON REPAIR RIGHT RING FINGER  2011   THUMB TRIGGER RELEASE Right 1993   THYROIDECTOMY, PARTIAL  1983   TONSILLECTOMY  1968   TOTAL KNEE ARTHROPLASTY Bilateral right 05-04-2000/  left 03-29-2001   VULVECTOMY  05/27/2012   Procedure: WIDE EXCISION VULVECTOMY;  Surgeon: Imagene Gurney A. Alycia Rossetti, MD;  Location: [facility];  Service: Gynecology;  Laterality: N/A;  Wide Local Excision of Vulva    VULVECTOMY N/A 11/23/2012   Procedure: WIDE LOCAL EXCISION OF THE VULVA;  Surgeon: Imagene Gurney A. Alycia Rossetti, MD;  Location: [facility];  Service: Gynecology;  Laterality: N/A;  left inferior vulvar biopsy excision left superior lesion left inferior vulvar excision   VULVECTOMY N/A 08/02/2013   Procedure: WIDE LOCAL  EXCISION VULVA;  Surgeon: Imagene Gurney A. Alycia Rossetti, MD;  Location: [facility];  Service: Gynecology;  Laterality: N/A;   VULVECTOMY Left 03/29/2014   Procedure: WIDE LOCAL EXCISION OF LEFT VULVA ;  Surgeon: Imagene Gurney A. Alycia Rossetti, MD;  Location: [facility];  Service: Gynecology;  Laterality: Left;   VULVECTOMY Bilateral 01/24/2015   Procedure: WIDE LOCAL EXCISION VULVA;  Surgeon: [name], MD;  Location: [facility];  Service: Gynecology;  Laterality: Bilateral;   VULVECTOMY PARTIAL  07-16-2010    Family History  Problem Relation Age of Onset   Kidney cancer Father 46       metastatic    Alzheimer's disease Mother    Other Mother        ? Cancer   Heart attack Maternal Grandmother    Alcohol abuse  Brother    Bladder Cancer Brother        dx 47s   Breast cancer Paternal Aunt        dx after 87   Bladder Cancer Paternal Aunt        dx after 66   Breast cancer Cousin        two paternal female cousins; unknown age of diagnosis   Cancer Maternal Aunt        ? gallbladder; dx 30s   Cancer Maternal Uncle        unknown type; ? pancreatic ? colon; dx after 57   Brain cancer Paternal Uncle        unknown age of dx   Melanoma Brother        dx late 71s-early 60s   Bone cancer Maternal Uncle        dx after 15   Leukemia Maternal Uncle        dx after 30   Bone cancer Maternal Uncle        dx after 24   Lung cancer Maternal Uncle        dx after 59   Brain cancer Cousin        two maternal female cousins; dx after 88   Stomach cancer Paternal Aunt        unknown age of dx    Social History   Socioeconomic History   Marital status: Married    Spouse name: Not on file   Number of children: Not on file   Years of education: Not on file   Highest education level: Not on file  Occupational History  Not on file  Tobacco Use   Smoking status: Former    Packs/day: 2.00    Years: 41.00    Total pack years: 82.00    Types: Cigarettes    Quit date: 06/05/1999    Years since quitting: 22.9   Smokeless tobacco: Never  Vaping Use   Vaping Use: Never used  Substance and Sexual Activity   Alcohol use: No   Drug use: No   Sexual activity: Not Currently    Birth control/protection: Post-menopausal  Other Topics Concern   Not on file  Social History Narrative   Not on file   Social Determinants of Health   Financial Resource Strain: Not on file  Food Insecurity: Not on file  Transportation Needs: Not on file  Physical Activity: Not on file  Stress: Not on file  Social Connections: Not on file    Current Medications:  Current Outpatient Medications:    acetaminophen (TYLENOL) 500 MG tablet, Take 1,000 mg by mouth every 6 (six) hours as needed for mild pain or  moderate pain., Disp: , Rfl:    albuterol (PROVENTIL HFA;VENTOLIN HFA) 108 (90 BASE) MCG/ACT inhaler, Inhale 2 puffs into the lungs every 4 (four) hours as needed for wheezing or shortness of breath. Reported on 08/01/2015, Disp: , Rfl:    aspirin EC 81 MG EC tablet, Take 1 tablet (81 mg total) daily by mouth., Disp: 30 tablet, Rfl: 0   atorvastatin (LIPITOR) 40 MG tablet, Take 1 tablet (40 mg total) by mouth daily., Disp: 90 tablet, Rfl: 3   bisoprolol (ZEBETA) 5 MG tablet, TAKE 1 TABLET BY MOUTH EVERY DAY, Disp: 90 tablet, Rfl: 3   budesonide-formoterol (SYMBICORT) 160-4.5 MCG/ACT inhaler, Inhale 2 puffs into the lungs daily as needed (Wheezing in the winter)., Disp: , Rfl:    buPROPion (WELLBUTRIN XL) 150 MG 24 hr tablet, Take 1 tablet by mouth every morning., Disp: , Rfl:    Calcium Carbonate-Vitamin D (OYSTER SHELL CALCIUM/D) 500-5 MG-MCG TABS, Take by mouth., Disp: , Rfl:    celecoxib (CELEBREX) 200 MG capsule, Take 1 capsule (200 mg total) by mouth daily., Disp: 30 capsule, Rfl: 0   Cholecalciferol (VITAMIN D3) 1000 UNITS CAPS, Take 2,000 Units by mouth daily. , Disp: , Rfl:    citalopram (CELEXA) 40 MG tablet, Take 1 tablet by mouth daily., Disp: , Rfl:    furosemide (LASIX) 40 MG tablet, Take 1.5 tablets (60 mg total) by mouth daily. Take extra 1/2 tablet as needed for weight gain. (Patient taking differently: Take 40 mg by mouth daily. Take extra 1/2 tablet as needed for extra fluid), Disp: 150 tablet, Rfl: 3   hydrOXYzine (VISTARIL) 25 MG capsule, Take 25 mg by mouth 3 (three) times daily as needed for itching., Disp: , Rfl:    ibandronate (BONIVA) 150 MG tablet, Take 150 mg by mouth every 30 (thirty) days. Take in the morning with a full glass of water, on an empty stomach, and do not take anything else by mouth or lie down for the next 30 min., Disp: , Rfl:    ipratropium-albuterol (DUONEB) 0.5-2.5 (3) MG/3ML SOLN, Take 3 mLs by nebulization every 4 (four) hours as needed (Wheezing)., Disp:  , Rfl:    levothyroxine (SYNTHROID) 200 MCG tablet, Take 200 mcg by mouth daily before breakfast. , Disp: , Rfl:    methocarbamol (ROBAXIN) 500 MG tablet, TAKE 1 TABLET BY MOUTH EVERY 8 HOURS AS NEEDED, Disp: 30 tablet, Rfl: 0   montelukast (SINGULAIR) 10 MG  tablet, Take 10 mg by mouth daily., Disp: , Rfl:    Multiple Vitamin (MULTIVITAMIN WITH MINERALS) TABS tablet, Take 1 tablet daily by mouth., Disp: , Rfl:    nystatin (MYCOSTATIN/NYSTOP) powder, Apply 1 Application topically 3 (three) times daily., Disp: 15 g, Rfl: 3   pantoprazole (PROTONIX) 40 MG tablet, Take 40 mg by mouth daily., Disp: , Rfl:    polyvinyl alcohol (LIQUIFILM TEARS) 1.4 % ophthalmic solution, Place 1 drop into both eyes daily as needed for dry eyes. , Disp: , Rfl:    spironolactone (ALDACTONE) 25 MG tablet, TAKE 1 TABLET (25 MG TOTAL) BY MOUTH DAILY., Disp: 90 tablet, Rfl: 3   imiquimod (ALDARA) 5 % cream, Apply topically 3 (three) times a week. To two areas on the vulva near the labia for total of 12 weeks, Disp: 12 each, Rfl: 3  Review of Systems: + Occasional ringing in ears, vision problems, shortness of breath intermittently, wheezing intermittently, leg swelling, bloating, occasional abdominal pain, occasional constipation, occasional pelvic pain, left lower leg and knee pain, back pain, itch, dizziness at times, numbness in left leg, easy bruising, anxiety, depression. Denies appetite changes, fevers, chills, fatigue, unexplained weight changes. Denies hearing loss, neck lumps or masses, mouth sores, or voice changes. Denies cough.   Denies chest pain or palpitations.  Denies blood in stools, diarrhea, nausea, vomiting, or early satiety. Denies pain with intercourse, dysuria, frequency, hematuria or incontinence. Denies hot flashes, vaginal bleeding or vaginal discharge.   Denies muscle pain/cramps. Denies rash, or wounds. Denies headaches or seizures. Denies swollen lymph nodes or glands. Denies confusion, or  decreased concentration.  Physical Exam: BP (!) 144/58 (BP Location: Left Arm, Patient Position: Sitting)   Pulse 70   Temp 98.5 F (36.9 C) (Oral)   Resp 17   Ht '5\' 5"'$  (1.651 m)   Wt 250 lb (113.4 kg)   SpO2 98%   BMI 41.60 kg/m  General: Alert, oriented, no acute distress. HEENT: Normocephalic, atraumatic, sclera anicteric. Chest: Unlabored breathing on room air Extremities: Grossly normal range of motion.  Warm, well perfused.  1-2+ edema left lower extremity, very mild erythema along the anterior lower leg. Skin: Significant erythema and findings consistent with candidiasis along the pannus and to the left groin. Lymphatics: No cervical, supraclavicular, or inguinal adenopathy. GU: Vulva notable for significant anatomic changes related to prior surgeries.  The right labia is somewhat tender to palpation.  The area of desquamation and leukoplakia along the right labia is now more extensive, measuring approximately 4 cm.  Small area on the left labia at approximately 3:00 is similar in size to her last visit.  Vulvar biopsy procedure Preoperative diagnosis: history of Pagets, suspected recurrent Pagets Postoperative diagnosis: Same as above Physician: Berline Lopes MD Estimated blood loss: Minimal Specimens: Vulvar biopsy Procedure: After the procedure was discussed with the patient including risks and benefits, she gave verbal consent.  She was then placed in dorsolithotomy position.  The area to be biopsied was identified and cleansed with Betadine x 3.  Approximately 2cc of 2% lidocaine was then injected for local anesthesia.  Her adequate time for anesthesia was given, a 3 mm punch biopsy was taken from the right labia at approximately 9:00.  This was placed in formalin.  Silver nitrate was used to achieve hemostasis.  Overall the patient tolerated the procedure well.    Laboratory & Radiologic Studies: None new  Assessment & Plan: [name] is a 76 y.o. woman with long history  of Paget's  disease of the vulva.  Now with growing right labial lesion and stable left labial lesion, both consistent with Paget's.  Biopsy performed today.  Unfortunately, the patient did not let me know that she had run out of Aldara, so she has not been using any treatment for the last 3 months.  She very much wishes to defer any sort of surgical intervention.  Given this, I recommended biopsy today which was performed.  I will call her with these results.  As long as this confirms Paget's without underlying adenocarcinoma or other findings necessitating surgery, I think it is reasonable to attempt treatment with Aldara.  We brought the patient's husband into the I could show him the areas that need to be treated topically 3 times a week for the next 12 weeks.  I have asked her to call if she develops any new or worsening symptoms between now and her next visit.  Patient has had recent onset of symptoms related to candidiasis.  Nystatin powder sent to her pharmacy.  26 minutes of total time was spent for this patient encounter, including preparation, face-to-face counseling with the patient and coordination of care, and documentation of the encounter.  Jeral Pinch, MD  Division of Gynecologic Oncology  Department of Obstetrics and Gynecology  [facility]

## 2022-05-12 ENCOUNTER — Telehealth: Payer: Self-pay | Admitting: Orthopedic Surgery

## 2022-05-12 NOTE — Telephone Encounter (Signed)
Patient called advised she got her PCP to complete the handicap placard and to disregard her previous note.   Please see previous note.

## 2022-05-12 NOTE — Telephone Encounter (Signed)
Patient called advised her handicap Placard will run out at the end of the week. Patient asked if she can get another one? Patient said she will bring the form over to Dr. Marlou Sa. The number to contact patient is [phone] or [phone] cell

## 2022-05-12 NOTE — Telephone Encounter (Signed)
noted 

## 2022-05-13 LAB — SURGICAL PATHOLOGY

## 2022-05-13 NOTE — Telephone Encounter (Signed)
Sure thx

## 2022-05-23 ENCOUNTER — Telehealth: Payer: Self-pay | Admitting: Cardiology

## 2022-05-23 NOTE — Telephone Encounter (Signed)
Pt would like a print out of all the copays she paid this year. Please advise

## 2022-07-21 ENCOUNTER — Telehealth: Payer: Self-pay | Admitting: *Deleted

## 2022-07-21 NOTE — Telephone Encounter (Signed)
This RN spoke with per her call stating she had labs done 2 weeks ago for thyroid check up- with noted decreased WBC and Plts.

## 2022-07-24 ENCOUNTER — Telehealth: Payer: Self-pay | Admitting: *Deleted

## 2022-07-24 ENCOUNTER — Telehealth: Payer: Self-pay | Admitting: Hematology and Oncology

## 2022-07-24 NOTE — Telephone Encounter (Signed)
Spoke with patient confirming upcoming appointment  

## 2022-07-24 NOTE — Telephone Encounter (Signed)
Per review of labs obtained recently at [facility] and concerns noted by midlevel - appt request for visit with lab post visit for discussion of concerns further labs if needed.  This RN spoke with pt who is in agreement to above.  Appointment request sent to scheduling.

## 2022-07-24 NOTE — Telephone Encounter (Signed)
Per review of labs and pt's concern by midlevel - appt requested for assessment and possible further labs.  Pt is in agreement to above- request for appt sent to scheduling.

## 2022-08-05 ENCOUNTER — Other Ambulatory Visit: Payer: Self-pay

## 2022-08-05 ENCOUNTER — Encounter: Payer: Self-pay | Admitting: Adult Health

## 2022-08-05 ENCOUNTER — Inpatient Hospital Stay: Payer: Medicare HMO | Attending: Gynecologic Oncology | Admitting: Adult Health

## 2022-08-05 ENCOUNTER — Inpatient Hospital Stay: Payer: Medicare HMO

## 2022-08-05 ENCOUNTER — Telehealth: Payer: Self-pay | Admitting: *Deleted

## 2022-08-05 VITALS — BP 127/94 | HR 64 | Temp 97.3°F | Resp 18 | Ht 65.0 in | Wt 252.6 lb

## 2022-08-05 DIAGNOSIS — H8111 Benign paroxysmal vertigo, right ear: Secondary | ICD-10-CM | POA: Diagnosis not present

## 2022-08-05 DIAGNOSIS — Z87891 Personal history of nicotine dependence: Secondary | ICD-10-CM | POA: Diagnosis not present

## 2022-08-05 DIAGNOSIS — D708 Other neutropenia: Secondary | ICD-10-CM

## 2022-08-05 DIAGNOSIS — Z853 Personal history of malignant neoplasm of breast: Secondary | ICD-10-CM | POA: Diagnosis not present

## 2022-08-05 DIAGNOSIS — Z9013 Acquired absence of bilateral breasts and nipples: Secondary | ICD-10-CM | POA: Diagnosis not present

## 2022-08-05 DIAGNOSIS — D0512 Intraductal carcinoma in situ of left breast: Secondary | ICD-10-CM | POA: Diagnosis not present

## 2022-08-05 LAB — CBC WITH DIFFERENTIAL (CANCER CENTER ONLY)
Abs Immature Granulocytes: 0.02 10*3/uL (ref 0.00–0.07)
Basophils Absolute: 0 10*3/uL (ref 0.0–0.1)
Basophils Relative: 0 %
Eosinophils Absolute: 0.1 10*3/uL (ref 0.0–0.5)
Eosinophils Relative: 1 %
HCT: 39.6 % (ref 36.0–46.0)
Hemoglobin: 13 g/dL (ref 12.0–15.0)
Immature Granulocytes: 0 %
Lymphocytes Relative: 22 %
Lymphs Abs: 1.1 10*3/uL (ref 0.7–4.0)
MCH: 29.1 pg (ref 26.0–34.0)
MCHC: 32.8 g/dL (ref 30.0–36.0)
MCV: 88.8 fL (ref 80.0–100.0)
Monocytes Absolute: 0.3 10*3/uL (ref 0.1–1.0)
Monocytes Relative: 7 %
Neutro Abs: 3.4 10*3/uL (ref 1.7–7.7)
Neutrophils Relative %: 70 %
Platelet Count: 132 10*3/uL — ABNORMAL LOW (ref 150–400)
RBC: 4.46 MIL/uL (ref 3.87–5.11)
RDW: 13.4 % (ref 11.5–15.5)
WBC Count: 4.9 10*3/uL (ref 4.0–10.5)
nRBC: 0 % (ref 0.0–0.2)

## 2022-08-05 LAB — CMP (CANCER CENTER ONLY)
ALT: 18 U/L (ref 0–44)
AST: 20 U/L (ref 15–41)
Albumin: 4.3 g/dL (ref 3.5–5.0)
Alkaline Phosphatase: 66 U/L (ref 38–126)
Anion gap: 5 (ref 5–15)
BUN: 15 mg/dL (ref 8–23)
CO2: 31 mmol/L (ref 22–32)
Calcium: 9.5 mg/dL (ref 8.9–10.3)
Chloride: 104 mmol/L (ref 98–111)
Creatinine: 0.63 mg/dL (ref 0.44–1.00)
GFR, Estimated: >60 mL/min (ref >60)
Glucose, Bld: 107 mg/dL — ABNORMAL HIGH (ref 70–99)
Potassium: 4.6 mmol/L (ref 3.5–5.1)
Sodium: 140 mmol/L (ref 135–145)
Total Bilirubin: 0.6 mg/dL (ref 0.3–1.2)
Total Protein: 6.7 g/dL (ref 6.5–8.1)

## 2022-08-05 LAB — VITAMIN B12: Vitamin B-12: 559 pg/mL (ref 180–914)

## 2022-08-05 LAB — SAVE SMEAR(SSMR), FOR PROVIDER SLIDE REVIEW

## 2022-08-05 LAB — FOLATE: Folate: >40 ng/mL (ref >5.9)

## 2022-08-05 NOTE — Telephone Encounter (Signed)
Patient moved appt from 3/14 to 4/25

## 2022-08-05 NOTE — Assessment & Plan Note (Addendum)
[name] is a 77 year old woman with h/o stage II right breast cancer diagnosed in 1997 s/p mastectomy, chemotherapy, and antiestrogen therapy, and left breast DCIS diagnosed in 2005 status post lumpectomy and adjuvant antiestrogen therapy with letrozole for 5 years.  Gabriel Rung is here today for follow-up and evaluation of 2 issues and whether these could possibly be related to her history of breast cancer.  First she has a slightly decreased white blood cell count along with a slightly decreased platelet count.  I reviewed her lab testing results with Dr. Lindi Adie.  We will repeat a CBC with differential today along with the CM P, B12, folate, and ANA.  She also has vertigo and I referred her to neuro rehab for evaluation and treatment to see if the Epley maneuver will help with this.  We will see her back in 4 weeks for follow-up and repeat lab testing as indicated.

## 2022-08-05 NOTE — Progress Notes (Signed)
[facility] Follow up:    Chesley Noon, MD [address] [place] [address]   DIAGNOSIS: History of right breast invasive and left breast non invasive breast cancers  SUMMARY OF ONCOLOGIC HISTORY: [facility] woman   (1) status post right mastectomy and axillary lymph node dissection in 1997 for a stage II, estrogen receptor positive invasive breast cancer, treated adjuvantly with chemotherapy (agents not retrievable), followed by tamoxifen for 6 years   (2) status post left excisional biopsy 03/28/2004 for ductal carcinoma in situ, grade 2, measuring 2 cm, with negative margins, estrogen and progesterone receptor positive. She received letrozole x6 years after this.   (3) greater than 40-pack-year smoking history, the patient quitting in 2001   (4) genetic testing 05/18/2020 through the Multi-Gene Panel found no deleterious mutations in AIP, ALK, APC, ATM, AXIN2,BAP1,  BARD1, BLM, BMPR1A, BRCA1, BRCA2, BRIP1, CASR, CDC73, CDH1, CDK4, CDKN1B, CDKN1C, CDKN2A (p14ARF), CDKN2A (p16INK4a), CEBPA, CHEK2, CTNNA1, DICER1, DIS3L2, EGFR (c.2369C>T, p.Thr790Met variant only), EPCAM (Deletion/duplication testing only), FH, FLCN, GATA2, GPC3, GREM1 (Promoter region deletion/duplication testing only), HOXB13 (c.251G>A, p.Gly84Glu), HRAS, KIT, MAX, MEN1, MET, MITF (c.952G>A, p.Glu318Lys variant only), MLH1, MSH2, MSH3, MSH6, MUTYH, NBN, NF1, NF2, NTHL1, PALB2, PDGFRA, PHOX2B, PMS2, POLD1, POLE, POT1, PRKAR1A, PTCH1, PTEN, RAD50, RAD51C, RAD51D, RB1, RECQL4, RET, RNF43, RUNX1, SDHAF2, SDHA (sequence changes only), SDHB, SDHC, SDHD, SMAD4, SMARCA4, SMARCB1, SMARCE1, STK11, SUFU, TERC, TERT, TMEM127, TP53, TSC1, TSC2, VHL, WRN and WT1.  (A) Variant of uncertain significance detected in [facility] at c.295T>C (p.Tyr99His).  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  She has  undergone follow-up with GYN oncology and repeat biopsies and treatment since 2012.  Most recently using Aldara.  Please see Dr. Charisse March notes in epic for more detail.  CURRENT THERAPY:Observation  INTERVAL HISTORY: AVNOOR WUNDERLE 77 y.o. female returns for follow-up of her history of right breast invasive and left breast noninvasive breast cancer.  She has continued on observation alone.  She is here today a due to a concern of 2 things.  She has been experiencing dizzy spells.  She says that the room will spin towards her right side and she will feel imbalance.  This led to a fall last week when she was going to change the water in her dog's water bowl.  She also underwent some recent blood testing which showed a slightly decreased white blood cell count of 3.2 and a platelet count of 111.  She has not recent infections or new medications.  She is concerned about this being related and is here to follow-up for evaluation and further testing as indicated.  Her most recent left breast screening mammogram occurred on January 29, 2022 showing no mammographic evidence of malignancy and breast density category B.  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  Incomplete emptying of bladder 07/30/2016   Left foot pain 12/11/2015   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   Stopped  smoking with greater than 40 pack year history 10/23/2015   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   Mixed incontinence urge and stress 09/16/2011   Cancer of right breast, stage 2 ([place]) 05/28/2011   DCIS (ductal carcinoma in situ) of breast 05/28/2011   Hypothyroid 05/28/2011   GERD (gastroesophageal reflux disease) 05/28/2011   Fibromyalgia 05/28/2011   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   Cancer of right breast, stage 2 ([place]) 05/28/2011   2.8 cm invasive 1 node pos ER pos S/P mastectomy 06/1995 then AC x 4 then Tam X 5    Chest pain, rule out acute myocardial infarction 04/10/2017   Chronic constipation    Chronic cystitis 04/09/2017   Cough 08/23/2007   Qualifier: Diagnosis of  By: Gwenette Greet MD, Armando Reichert    Depression    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   Dysrhythmia 2020   Had Afib   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   Fibromyalgia    GERD (gastroesophageal reflux disease)    History of adenomatous polyp of colon 09/30/2018   History of breast cancer no recurrence   1997  right breast cancer  s/p  mastectomy w/ snl dissection  and chemoradiation/  2005  left breast cancer DCIS  s/p  lumpectomy and radiation   History of colon polyps    2007 hyperplagia   History of esophageal dilatation    2008   History of peptic ulcer    History of small bowel obstruction    2012   Hypothyroid 05/28/2011   Hypothyroidism    Incomplete emptying of bladder 07/30/2016   Left foot pain 12/11/2015   Overview:  Dr Cyril Mourning [facility]   Macular degeneration of right eye    Mild depression 05/28/2011   Mixed incontinence urge and stress 09/16/2011   S/p lumax desires surgery    Morbid obesity ([place]) 04/10/2011   Numbness of left foot    4 toes   Osteoporosis 12/15/2016   Overview:  Dr Cletis Media   Paget's disease of vulva [facility])    Personal history of chemotherapy 1997   Personal history of radiation therapy 2005   PONV (postoperative nausea and vomiting)    and HARD TO WAKE   Seasonal allergies    Shortness of breath    SKIN CANCER, HX OF 08/23/2007   Qualifier: Diagnosis of  By: Doy Mince LPN, Megan     SUI (stress urinary incontinence, female)    Thrombocytopenic disorder ([place]) 02/25/2017   Wears glasses  SURGICAL HISTORY: Past Surgical History:  Procedure Laterality Date   BLADDER SUSPENSION  10/13/2011   Procedure: TRANSVAGINAL TAPE (TVT) PROCEDURE;  Surgeon: Delice Lesch, MD;  Location: [facility];  Service: Gynecology;  Laterality: N/A;   BREAST BIOPSY Left 03/19/2004   BREAST LUMPECTOMY Left 03-27-2004   CARDIAC CATHETERIZATION  10-30-2000  dr Wynonia Lawman   normal coronary arteries and LVF/  ef 65-70%   CLOSED MANIPULATION  W/ OPEN REDUCTION EXCHANGE TIBIAL FEMORAL BEARING POST TOTAL LEFT KNEE  04-14-2001   CYSTOSCOPY  10/13/2011   Procedure: CYSTOSCOPY;  Surgeon: Delice Lesch, MD;  Location: [facility];  Service: Gynecology;  Laterality: Bilateral;   DIAGNOSTIC LAPAROSCOPY     Colon d/t SBO   DILATION AND CURETTAGE OF UTERUS  1968   FOOT SURGERY Left 02-01-2009   TRIPLE ARTHRODESIS   FOOT SURGERY Left 11/14   I&D left foot  with woundvac placement   INCISIONAL HERNIA REPAIR  05/05/2011   Procedure: LAPAROSCOPIC INCISIONAL HERNIA;  Surgeon: Edward Jolly, MD;  Location: [facility];  Service: General;  Laterality: N/A;  LAPAROSCOPIC REPAIR INCARCERATED INCISIONAL HERNIA  WITH MESH   KNEE ARTHROSCOPY Bilateral left 01-1984 & 04-1992/   right [id number]   LAPAROSCOPIC CHOLECYSTECTOMY  06-21-2010   LEFT HEART CATH AND CORONARY ANGIOGRAPHY N/A 04/13/2017   Procedure: LEFT HEART CATH AND CORONARY ANGIOGRAPHY;  Surgeon: Jettie Booze, MD;  Location: [facility];  Service: Cardiovascular;  Laterality: N/A;   LUMBAR SPINE SURGERY  1998   L5 -- S1   MASTECTOMY Right 1997   W/  LYMPH NODE DISSECTION AND RECONSTRUCTION WITH IMPLANTS AND EXPANDER   NEGATIVE SLEEP STUDY  12/26/2015   PORT-A-CATH PLACEMENT AND REMOVAL  1997   REMOVAL RIGHT BREAST IMPLANT AND CAPSULECTOMY  06-03-1999   REVERSE SHOULDER ARTHROPLASTY Left 10/25/2019   Procedure: LEFT REVERSE SHOULDER ARTHROPLASTY;  Surgeon: Meredith Pel, MD;  Location: [facility];  Service: Orthopedics;  Laterality: Left;   REVISION TOTAL KNEE ARTHROPLASTY Left 07-07-2005  &  12-10-2001   12-10-2001  REMOVAL TOTAL KNEE/ I&D / INSERTION ANTIBIOTIC SPACER   REVISION TOTAL KNEE ARTHROPLASTY Right 07-07-2005   ROTATOR CUFF REPAIR Right    TENDON REPAIR RIGHT RING FINGER  2011   THUMB TRIGGER RELEASE Right 1993   THYROIDECTOMY, PARTIAL  1983   TONSILLECTOMY  1968   TOTAL KNEE ARTHROPLASTY Bilateral right 05-04-2000/  left 03-29-2001   VULVECTOMY  05/27/2012   Procedure: WIDE EXCISION VULVECTOMY;  Surgeon: Imagene Gurney A. Alycia Rossetti, MD;  Location: [facility];  Service: Gynecology;  Laterality: N/A;  Wide Local Excision of Vulva    VULVECTOMY N/A 11/23/2012   Procedure: WIDE LOCAL EXCISION OF THE VULVA;  Surgeon: Imagene Gurney A. Alycia Rossetti, MD;  Location: [facility];  Service: Gynecology;  Laterality: N/A;  left inferior vulvar biopsy excision left superior lesion left inferior vulvar excision    VULVECTOMY N/A 08/02/2013   Procedure: WIDE LOCAL  EXCISION VULVA;  Surgeon: Imagene Gurney A. Alycia Rossetti, MD;  Location: [facility];  Service: Gynecology;  Laterality: N/A;   VULVECTOMY Left 03/29/2014   Procedure: WIDE LOCAL EXCISION OF LEFT VULVA ;  Surgeon: Imagene Gurney A. Alycia Rossetti, MD;  Location[facility];  Service: Gynecology;  Laterality: Left;   VULVECTOMY Bilateral 01/24/2015   Procedure: WIDE LOCAL EXCISION VULVA;  Surgeon: [name], MD;  Location: [facility];  Service: Gynecology;  Laterality: Bilateral;   VULVECTOMY PARTIAL  07-16-2010    SOCIAL HISTORY: Social History   Socioeconomic History  Marital status: Married    Spouse name: Not on file   Number of children: Not on file   Years of education: Not on file   Highest education level: Not on file  Occupational History   Not on file  Tobacco Use   Smoking status: Former    Packs/day: 2.00    Years: 41.00    Total pack years: 82.00    Types: Cigarettes    Quit date: 06/05/1999    Years since quitting: 23.1   Smokeless tobacco: Never  Vaping Use   Vaping Use: Never used  Substance and Sexual Activity   Alcohol use: No   Drug use: No   Sexual activity: Not Currently    Birth control/protection: Post-menopausal  Other Topics Concern   Not on file  Social History Narrative   Not on file   Social Determinants of Health   Financial Resource Strain: Not on file  Food Insecurity: Not on file  Transportation Needs: Not on file  Physical Activity: Not on file  Stress: Not on file  Social Connections: Not on file  Intimate Partner Violence: Not on file    FAMILY HISTORY: Family History  Problem Relation Age of Onset   Kidney cancer Father 80       metastatic    Alzheimer's disease Mother    Other Mother        ? Cancer   Heart attack Maternal Grandmother    Alcohol abuse Brother    Bladder Cancer Brother        dx 20s   Breast cancer Paternal Aunt        dx after 50   Bladder Cancer  Paternal Aunt        dx after 11   Breast cancer Cousin        two paternal female cousins; unknown age of diagnosis   Cancer Maternal Aunt        ? gallbladder; dx 41s   Cancer Maternal Uncle        unknown type; ? pancreatic ? colon; dx after 35   Brain cancer Paternal Uncle        unknown age of dx   Melanoma Brother        dx late 65s-early 60s   Bone cancer Maternal Uncle        dx after 110   Leukemia Maternal Uncle        dx after 73   Bone cancer Maternal Uncle        dx after 38   Lung cancer Maternal Uncle        dx after 51   Brain cancer Cousin        two maternal female cousins; dx after 46   Stomach cancer Paternal Aunt        unknown age of dx    Review of Systems  Constitutional:  Negative for appetite change, chills, fatigue, fever and unexpected weight change.  HENT:   Negative for hearing loss, lump/mass and trouble swallowing.   Eyes:  Negative for eye problems and icterus.  Respiratory:  Negative for chest tightness, cough and shortness of breath.   Cardiovascular:  Negative for chest pain, leg swelling and palpitations.  Gastrointestinal:  Negative for abdominal distention, abdominal pain, constipation, diarrhea, nausea and vomiting.  Endocrine: Negative for hot flashes.  Genitourinary:  Negative for difficulty urinating.   Musculoskeletal:  Negative for arthralgias.  Skin:  Negative for itching and rash.  Neurological:  Positive for dizziness. Negative for extremity weakness, headaches, light-headedness and numbness.  Hematological:  Negative for adenopathy. Does not bruise/bleed easily.  Psychiatric/Behavioral:  Negative for depression. The patient is not nervous/anxious.       PHYSICAL EXAMINATION  ECOG PERFORMANCE STATUS: 2 - Symptomatic, <50% confined to bed  Vitals:   08/05/22 1104  BP: (!) 127/94  Pulse: 64  Resp: 18  Temp: (!) 97.3 F (36.3 C)  SpO2: 98%    Physical Exam Constitutional:      General: She is not in acute distress.     Appearance: Normal appearance. She is not toxic-appearing.  HENT:     Head: Normocephalic and atraumatic.  Eyes:     General: No scleral icterus. Cardiovascular:     Rate and Rhythm: Normal rate and regular rhythm.     Pulses: Normal pulses.     Heart sounds: Normal heart sounds.  Pulmonary:     Effort: Pulmonary effort is normal.     Breath sounds: Normal breath sounds.  Chest:     Comments: Right breast status postmastectomy no sign of local recurrence left breast status postlumpectomy no sign of local recurrence.   Abdominal:     General: Abdomen is flat. Bowel sounds are normal. There is no distension.     Palpations: Abdomen is soft.     Tenderness: There is no abdominal tenderness.  Musculoskeletal:        General: No swelling.     Cervical back: Neck supple.  Lymphadenopathy:     Cervical: No cervical adenopathy.  Skin:    General: Skin is warm and dry.     Findings: No rash.  Neurological:     General: No focal deficit present.     Mental Status: She is alert and oriented to person, place, and time.     Comments: + right horizontal nystagmus  Psychiatric:        Mood and Affect: Mood normal.        Behavior: Behavior normal.     LABORATORY DATA: Pending   ASSESSMENT and THERAPY PLAN:   Ductal carcinoma in situ (DCIS) of left breast Rickiya is a 77 year old woman with h/o stage II right breast cancer diagnosed in 1997 s/p mastectomy, chemotherapy, and antiestrogen therapy, and left breast DCIS diagnosed in 2005 status post lumpectomy and adjuvant antiestrogen therapy with letrozole for 5 years.  Gabriel Rung is here today for follow-up and evaluation of 2 issues and whether these could possibly be related to her history of breast cancer.  First she has a slightly decreased white blood cell count along with a slightly decreased platelet count.  I reviewed her lab testing results with Dr. Lindi Adie.  We will repeat a CBC with differential today along with the CM P, B12, folate,  and ANA.  She also has vertigo and I referred her to neuro rehab for evaluation and treatment to see if the Epley maneuver will help with this.  We will see her back in 4 weeks for follow-up and repeat lab testing as indicated.   All questions were answered. The patient knows to call the clinic with any problems, questions or concerns. We can certainly see the patient much sooner if necessary.  Total encounter time:40 minutes*in face-to-face visit time, chart review, lab review, care coordination, order entry, and documentation of the encounter time.    Wilber Bihari, NP 08/05/22 12:08 PM Medical Oncology and Hematology [place], [place] [address]  Tel. [phone]    Fax. [phone]  *Total Encounter Time as defined by the Centers for Medicare and Medicaid Services includes, in addition to the face-to-face time of a patient visit (documented in the note above) non-face-to-face time: obtaining and reviewing outside history, ordering and reviewing medications, tests or procedures, care coordination (communications with other health care professionals or caregivers) and documentation in the medical record.

## 2022-08-06 LAB — ANTINUCLEAR ANTIBODIES, IFA: ANA Ab, IFA: NEGATIVE

## 2022-08-07 ENCOUNTER — Inpatient Hospital Stay: Payer: Medicare HMO | Admitting: Gynecologic Oncology

## 2022-08-12 ENCOUNTER — Ambulatory Visit: Payer: Medicare HMO | Admitting: Physical Therapy

## 2022-08-18 ENCOUNTER — Ambulatory Visit: Payer: Medicare HMO | Attending: Cardiology | Admitting: Cardiology

## 2022-08-18 ENCOUNTER — Encounter: Payer: Self-pay | Admitting: Cardiology

## 2022-08-18 VITALS — BP 124/74 | HR 65 | Ht 65.0 in | Wt 245.0 lb

## 2022-08-18 DIAGNOSIS — R0602 Shortness of breath: Secondary | ICD-10-CM

## 2022-08-18 DIAGNOSIS — I493 Ventricular premature depolarization: Secondary | ICD-10-CM | POA: Diagnosis not present

## 2022-08-18 DIAGNOSIS — Z79899 Other long term (current) drug therapy: Secondary | ICD-10-CM

## 2022-08-18 DIAGNOSIS — I5032 Chronic diastolic (congestive) heart failure: Secondary | ICD-10-CM

## 2022-08-18 NOTE — Patient Instructions (Signed)
Medication Instructions:  Your physician recommends that you continue on your current medications as directed. Please refer to the Current Medication list given to you today.  *If you need a refill on your cardiac medications before your next appointment, please call your pharmacy*   Lab Work: Today: BMET & BNP  If you have labs (blood work) drawn today and your tests are completely normal, you will receive your results only by: [place] (if you have MyChart) OR A paper copy in the mail If you have any lab test that is abnormal or we need to change your treatment, we will call you to review the results.   Testing/Procedures: None ordered   Follow-Up: At [facility], you and your health needs are our priority.  As part of our continuing mission to provide you with exceptional heart care, we have created designated Provider Care Teams.  These Care Teams include your primary Cardiologist (physician) and Advanced Practice Providers (APPs -  Physician Assistants and Nurse Practitioners) who all work together to provide you with the care you need, when you need it.  Your next appointment:   6 month(s)  The format for your next appointment:   In Person  Provider:   You will see one of the following Advanced Practice Providers on your designated Care Team:   Tommye Standard, [place] Legrand Como "Jonni Sanger" Chalmers Cater, [place]   Thank you for choosing [facility]!!   Trinidad Curet, RN [phone]  Other Instructions

## 2022-08-18 NOTE — Progress Notes (Signed)
Electrophysiology Office Note   Date:  08/18/2022   ID:  [name], DOB May 17, 1946, MRN [id number]  PCP:  Chesley Noon, MD  Cardiologist:  [facility] Primary Electrophysiologist:  Danahi Reddish Meredith Leeds, MD    Chief Complaint: PVC   History of Present Illness: [name] is a 77 y.o. female who is being seen today for the evaluation of PVC at the request of Chesley Noon, MD. Presenting today for electrophysiology evaluation.  She has a history significant for diastolic heart failure, nonobstructive coronary artery disease, PVCs.  She had a normal Myoview in 2020.  She wore a 48-hour monitor that showed a 30% PVC burden.  She was started on mexiletine which showed a PVC burden down to 8.4%.  Today, denies symptoms of palpitations, chest pain, shortness of breath, orthopnea, PND, lower extremity edema, claudication, dizziness, presyncope, syncope, bleeding, or neurologic sequela. The patient is tolerating medications without difficulties.  She is currently feeling well.  She continues to have episodic palpitations but overall feels well.  She does note that she is more short of breath when she exerts herself and feels better when she is at rest.  She does have chronic left lower extremity edema, but right lower extremity is without edema.  She has had a left knee replacement with multiple knee surgeries.   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   Cancer of right breast, stage 2 ([place]) 05/28/2011   2.8 cm invasive 1 node pos ER pos S/P mastectomy 06/1995 then AC x 4 then Tam X 5    Chest pain, rule out acute myocardial infarction 04/10/2017   Chronic  constipation    Chronic cystitis 04/09/2017   Cough 08/23/2007   Qualifier: Diagnosis of  By: Gwenette Greet MD, Armando Reichert    Depression    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   Dysrhythmia 2020   Had Afib   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   Fibromyalgia    GERD (gastroesophageal reflux disease)    History of adenomatous polyp of colon 09/30/2018   History of breast cancer no recurrence   1997  right breast cancer  s/p  mastectomy w/ snl dissection and chemoradiation/  2005  left breast cancer DCIS  s/p  lumpectomy and radiation   History of colon polyps    2007 hyperplagia   History of esophageal dilatation    2008   History of peptic ulcer    History of small bowel obstruction    2012   Hypothyroid 05/28/2011   Hypothyroidism    Incomplete emptying of bladder 07/30/2016   Left foot pain 12/11/2015   Overview:  Dr Cyril Mourning [facility]   Macular degeneration of right eye    Mild depression 05/28/2011   Mixed incontinence urge and stress 09/16/2011   S/p lumax desires surgery    Morbid obesity ([place]) 04/10/2011   Numbness of left foot    4 toes   Osteoporosis 12/15/2016   Overview:  Dr Cletis Media   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[place])  Personal history of chemotherapy 1997   Personal history of radiation therapy 2005   PONV (postoperative nausea and vomiting)    and HARD TO WAKE   Seasonal allergies    Shortness of breath    SKIN CANCER, HX OF 08/23/2007   Qualifier: Diagnosis of  By: Doy Mince LPN, Megan     SUI (stress urinary incontinence, female)    Thrombocytopenic disorder ([place]) 02/25/2017   Wears glasses    Past Surgical History:  Procedure Laterality Date   BLADDER SUSPENSION  10/13/2011   Procedure: TRANSVAGINAL TAPE (TVT) PROCEDURE;  Surgeon: Delice Lesch, MD;  Location: [facility];  Service: Gynecology;  Laterality: N/A;   BREAST BIOPSY Left 03/19/2004   BREAST LUMPECTOMY Left 03-27-2004   CARDIAC  CATHETERIZATION  10-30-2000  dr Wynonia Lawman   normal coronary arteries and LVF/  ef 65-70%   CLOSED MANIPULATION  W/ OPEN REDUCTION EXCHANGE TIBIAL FEMORAL BEARING POST TOTAL LEFT KNEE  04-14-2001   CYSTOSCOPY  10/13/2011   Procedure: CYSTOSCOPY;  Surgeon: Delice Lesch, MD;  Location: [facility];  Service: Gynecology;  Laterality: Bilateral;   DIAGNOSTIC LAPAROSCOPY     Colon d/t SBO   DILATION AND CURETTAGE OF UTERUS  1968   FOOT SURGERY Left 02-01-2009   TRIPLE ARTHRODESIS   FOOT SURGERY Left 11/14   I&D left foot with woundvac placement   INCISIONAL HERNIA REPAIR  05/05/2011   Procedure: LAPAROSCOPIC INCISIONAL HERNIA;  Surgeon: Edward Jolly, MD;  Location: [facility];  Service: General;  Laterality: N/A;  LAPAROSCOPIC REPAIR INCARCERATED INCISIONAL HERNIA  WITH MESH   KNEE ARTHROSCOPY Bilateral left 01-1984 & 04-1992/   right [id number]   LAPAROSCOPIC CHOLECYSTECTOMY  06-21-2010   LEFT HEART CATH AND CORONARY ANGIOGRAPHY N/A 04/13/2017   Procedure: LEFT HEART CATH AND CORONARY ANGIOGRAPHY;  Surgeon: Jettie Booze, MD;  Location: [facility];  Service: Cardiovascular;  Laterality: N/A;   LUMBAR SPINE SURGERY  1998   L5 -- S1   MASTECTOMY Right 1997   W/  LYMPH NODE DISSECTION AND RECONSTRUCTION WITH IMPLANTS AND EXPANDER   NEGATIVE SLEEP STUDY  12/26/2015   PORT-A-CATH PLACEMENT AND REMOVAL  1997   REMOVAL RIGHT BREAST IMPLANT AND CAPSULECTOMY  06-03-1999   REVERSE SHOULDER ARTHROPLASTY Left 10/25/2019   Procedure: LEFT REVERSE SHOULDER ARTHROPLASTY;  Surgeon: Meredith Pel, MD;  Location: [place];  Service: Orthopedics;  Laterality: Left;   REVISION TOTAL KNEE ARTHROPLASTY Left 07-07-2005  &  12-10-2001   12-10-2001  REMOVAL TOTAL KNEE/ I&D / INSERTION ANTIBIOTIC SPACER   REVISION TOTAL KNEE ARTHROPLASTY Right 07-07-2005   ROTATOR CUFF REPAIR Right    TENDON REPAIR RIGHT RING FINGER  2011   THUMB TRIGGER RELEASE Right 1993   THYROIDECTOMY, PARTIAL  1983   TONSILLECTOMY   1968   TOTAL KNEE ARTHROPLASTY Bilateral right 05-04-2000/  left 03-29-2001   VULVECTOMY  05/27/2012   Procedure: WIDE EXCISION VULVECTOMY;  Surgeon: Imagene Gurney A. Alycia Rossetti, MD;  Location: [facility];  Service: Gynecology;  Laterality: N/A;  Wide Local Excision of Vulva    VULVECTOMY N/A 11/23/2012   Procedure: WIDE LOCAL EXCISION OF THE VULVA;  Surgeon: Imagene Gurney A. Alycia Rossetti, MD;  Location: [facility];  Service: Gynecology;  Laterality: N/A;  left inferior vulvar biopsy excision left superior lesion left inferior vulvar excision   VULVECTOMY N/A 08/02/2013   Procedure: WIDE LOCAL  EXCISION VULVA;  Surgeon: Imagene Gurney A. Alycia Rossetti, MD;  Location: [facility];  Service: Gynecology;  Laterality: N/A;   VULVECTOMY Left  03/29/2014   Procedure: WIDE LOCAL EXCISION OF LEFT VULVA ;  Surgeon: Imagene Gurney A. Alycia Rossetti, MD;  Location[facility];  Service: Gynecology;  Laterality: Left;   VULVECTOMY Bilateral 01/24/2015   Procedure: WIDE LOCAL EXCISION VULVA;  Surgeon: [name], MD;  Location: [place];  Service: Gynecology;  Laterality: Bilateral;   VULVECTOMY PARTIAL  07-16-2010     Current Outpatient Medications  Medication Sig Dispense Refill   acetaminophen (TYLENOL) 500 MG tablet Take 1,000 mg by mouth every 6 (six) hours as needed for mild pain or moderate pain.     albuterol (PROVENTIL HFA;VENTOLIN HFA) 108 (90 BASE) MCG/ACT inhaler Inhale 2 puffs into the lungs every 4 (four) hours as needed for wheezing or shortness of breath. Reported on 08/01/2015     aspirin EC 81 MG EC tablet Take 1 tablet (81 mg total) daily by mouth. 30 tablet 0   atorvastatin (LIPITOR) 40 MG tablet Take 1 tablet (40 mg total) by mouth daily. 90 tablet 3   bisoprolol (ZEBETA) 5 MG tablet TAKE 1 TABLET BY MOUTH EVERY DAY 90 tablet 3   budesonide-formoterol (SYMBICORT) 160-4.5 MCG/ACT inhaler Inhale 2 puffs into the lungs daily as needed (Wheezing in the winter).     buPROPion (WELLBUTRIN XL) 150 MG 24 hr tablet Take 1 tablet by  mouth every morning.     Calcium Carbonate-Vitamin D (OYSTER SHELL CALCIUM/D) 500-5 MG-MCG TABS Take by mouth.     celecoxib (CELEBREX) 200 MG capsule Take 1 capsule (200 mg total) by mouth daily. 30 capsule 0   Cholecalciferol (VITAMIN D3) 1000 UNITS CAPS Take 2,000 Units by mouth daily.      citalopram (CELEXA) 40 MG tablet Take 1 tablet by mouth daily.     furosemide (LASIX) 40 MG tablet Take 1.5 tablets (60 mg total) by mouth daily. Take extra 1/2 tablet as needed for weight gain. (Patient taking differently: Take 40 mg by mouth daily. Take extra 1/2 tablet as needed for extra fluid) 150 tablet 3   hydrOXYzine (VISTARIL) 25 MG capsule Take 25 mg by mouth 3 (three) times daily as needed for itching.     ibandronate (BONIVA) 150 MG tablet Take 150 mg by mouth every 30 (thirty) days. Take in the morning with a full glass of water, on an empty stomach, and do not take anything else by mouth or lie down for the next 30 min.     imiquimod (ALDARA) 5 % cream Apply topically 3 (three) times a week. To two areas on the vulva near the labia for total of 12 weeks 12 each 3   ipratropium-albuterol (DUONEB) 0.5-2.5 (3) MG/3ML SOLN Take 3 mLs by nebulization every 4 (four) hours as needed (Wheezing).     levothyroxine (SYNTHROID) 200 MCG tablet Take 200 mcg by mouth daily before breakfast.      methocarbamol (ROBAXIN) 500 MG tablet TAKE 1 TABLET BY MOUTH EVERY 8 HOURS AS NEEDED 30 tablet 0   montelukast (SINGULAIR) 10 MG tablet Take 10 mg by mouth daily.     Multiple Vitamin (MULTIVITAMIN WITH MINERALS) TABS tablet Take 1 tablet daily by mouth.     nystatin (MYCOSTATIN/NYSTOP) powder Apply 1 Application topically 3 (three) times daily. 15 g 3   pantoprazole (PROTONIX) 40 MG tablet Take 40 mg by mouth daily.     polyvinyl alcohol (LIQUIFILM TEARS) 1.4 % ophthalmic solution Place 1 drop into both eyes daily as needed for dry eyes.      spironolactone (ALDACTONE)  25 MG tablet TAKE 1 TABLET (25 MG TOTAL) BY MOUTH  DAILY. 90 tablet 3   sulfamethoxazole-trimethoprim (BACTRIM DS) 800-160 MG tablet Take 1 tablet by mouth 2 (two) times daily. Take 1 table by mouth twice daily for 7 days     No current facility-administered medications for this visit.    Allergies:   Tape, Hydromorphone hcl, Macrobid [nitrofurantoin], Morphine and related, Penicillins, and Valisone [betamethasone]   Social History:  The patient  reports that she quit smoking about 23 years ago. Her smoking use included cigarettes. She has a 82.00 pack-year smoking history. She has never used smokeless tobacco. She reports that she does not drink alcohol and does not use drugs.   Family History:  The patient's family history includes Alcohol abuse in her brother; Alzheimer's disease in her mother; Bladder Cancer in her brother and paternal aunt; Bone cancer in her maternal uncle and maternal uncle; Brain cancer in her cousin and paternal uncle; Breast cancer in her cousin and paternal aunt; Cancer in her maternal aunt and maternal uncle; Heart attack in her maternal grandmother; Kidney cancer (age of onset: 41) in her father; Leukemia in her maternal uncle; Lung cancer in her maternal uncle; Melanoma in her brother; Other in her mother; Stomach cancer in her paternal aunt.   ROS:  Please see the history of present illness.   Otherwise, review of systems is positive for none.   All other systems are reviewed and negative.   PHYSICAL EXAM: VS:  BP 124/74   Pulse 65   Ht 5\' 5"  (1.651 m)   Wt 245 lb (111.1 kg)   SpO2 97%   BMI 40.77 kg/m  , BMI Body mass index is 40.77 kg/m. GEN: Well nourished, well developed, in no acute distress  HEENT: normal  Neck: no JVD, carotid bruits, or masses Cardiac: RRR; no murmurs, rubs, or gallops,no edema  Respiratory:  clear to auscultation bilaterally, normal work of breathing GI: soft, nontender, nondistended, + BS MS: no deformity or atrophy  Skin: warm and dry Neuro:  Strength and sensation are  intact Psych: euthymic mood, full affect  EKG:  EKG is ordered today. Personal review of the ekg ordered shows sinus rhythm   Recent Labs:  01/07/2022: NT-Pro BNP 190 08/05/2022: ALT 18; BUN 15; Creatinine 0.63; Hemoglobin 13.0; Platelet Count 132; Potassium 4.6; Sodium 140    Lipid Panel  No results found for: "CHOL", "TRIG", "HDL", "CHOLHDL", "VLDL", "LDLCALC", "LDLDIRECT"   Wt Readings from Last 3 Encounters:  08/18/22 245 lb (111.1 kg)  08/05/22 252 lb 9.6 oz (114.6 kg)  05/09/22 250 lb (113.4 kg)      Other studies Reviewed: Additional studies/ records that were reviewed today include: TTE  07/13/19 Review of the above records today demonstrates:  Wall thickness is normal. Systolic  function is low normal. EF: 50-55%. Wall motion is within normal limits.   No apical thrombus Doppler parameters consistent with restrictive filling  pattern and markedly elevated LA pressure.  Mitral valve E/A ratio 2.4. Mitral valve structure is normal. There is mild to moderate mitral  regurgitation.  Cardiac monitor 02/17/2020 personally reviewed Max 130 bpm 09:07pm, 09/15 Min 51 bpm 04:06am, 09/17 Avg 64 bpm Less than 1% PACs 8.7% PVCs Predominant rhythm was sinus rhythm Symptoms associated with sinus rhythm and artifact   ASSESSMENT AND PLAN:  1.  PVCs: Burden of 30% on Holter monitor.  PVCs coming from the RV base.  Currently on mexiletine.  Repeat monitor shows an 8.4%  burden.  Currently feeling well.  Yer Olivencia continue with current management.  2.  Chronic diastolic heart failure: Is short of breath with exertion.  She has been taking Lasix.  Stephaie Dardis order a BNP and BMP.  I do not see obvious volume overload on exam.  Current medicines are reviewed at length with the patient today.   The patient does not have concerns regarding her medicines.  The following changes were made today: None  Labs/ tests ordered today include:  Orders Placed This Encounter  Procedures   Basic metabolic  panel   Pro b natriuretic peptide (BNP)   EKG 12-Lead     Disposition:   FU 6 months  Signed, Dynastie Knoop Meredith Leeds, MD  08/18/2022 5:08 PM     [facility] [address] [place] [address] [phone] (office) [phone] (fax)

## 2022-08-19 LAB — BASIC METABOLIC PANEL
BUN/Creatinine Ratio: 14 (ref 12–28)
BUN: 12 mg/dL (ref 8–27)
CO2: 23 mmol/L (ref 20–29)
Calcium: 9.7 mg/dL (ref 8.7–10.3)
Chloride: 103 mmol/L (ref 96–106)
Creatinine, Ser: 0.83 mg/dL (ref 0.57–1.00)
Glucose: 101 mg/dL — ABNORMAL HIGH (ref 70–99)
Potassium: 4.8 mmol/L (ref 3.5–5.2)
Sodium: 140 mmol/L (ref 134–144)
eGFR: 73 mL/min/{1.73_m2} (ref >59)

## 2022-08-19 LAB — PRO B NATRIURETIC PEPTIDE: NT-Pro BNP: 276 pg/mL (ref 0–738)

## 2022-08-28 ENCOUNTER — Ambulatory Visit: Payer: Medicare HMO | Admitting: Orthopedic Surgery

## 2022-08-28 ENCOUNTER — Other Ambulatory Visit (INDEPENDENT_AMBULATORY_CARE_PROVIDER_SITE_OTHER): Payer: Medicare HMO

## 2022-08-28 ENCOUNTER — Other Ambulatory Visit: Payer: Self-pay

## 2022-08-28 DIAGNOSIS — M25531 Pain in right wrist: Secondary | ICD-10-CM

## 2022-08-28 DIAGNOSIS — M25562 Pain in left knee: Secondary | ICD-10-CM | POA: Diagnosis not present

## 2022-08-28 DIAGNOSIS — M1811 Unilateral primary osteoarthritis of first carpometacarpal joint, right hand: Secondary | ICD-10-CM

## 2022-08-30 ENCOUNTER — Encounter: Payer: Self-pay | Admitting: Orthopedic Surgery

## 2022-08-30 MED ORDER — LIDOCAINE HCL 1 % IJ SOLN
3.0000 mL | INTRAMUSCULAR | Status: AC | PRN
Start: 2022-08-28 — End: 2022-08-28
  Administered 2022-08-28: 3 mL

## 2022-08-30 MED ORDER — BUPIVACAINE HCL 0.25 % IJ SOLN
0.3300 mL | INTRAMUSCULAR | Status: AC | PRN
  Administered 2022-08-28: .33 mL via INTRA_ARTICULAR

## 2022-08-30 MED ORDER — METHYLPREDNISOLONE ACETATE 40 MG/ML IJ SUSP
13.3300 mg | INTRAMUSCULAR | Status: AC | PRN
Start: 2022-08-28 — End: 2022-08-28
  Administered 2022-08-28: 13.33 mg via INTRA_ARTICULAR

## 2022-08-30 NOTE — Progress Notes (Signed)
Office Visit Note   Patient: [name]           Date of Birth: [birth year]           MRN: [id number] Visit Date: 08/28/2022 Requested by: Eartha Inch, MD [address] [place],  [place] [address] PCP: Eartha Inch, MD  Subjective: Chief Complaint  Patient presents with   Left Knee - Pain   Right Wrist - Pain    HPI: [name] is a 77 y.o. female who presents to the office reporting left knee and right wrist pain.  Patient had a fall in July 2023.  Been getting a little bit worse since that time.  She is been ambulating with a limp.  Reports constant pain in the knee region.  Does not really report much in terms of groin pain.  Describes some medial swelling.  Describes some weakness.  Hard for her to stay up on her feet.  Long.  She is using a cart when she gets around at [facility].  Having some throbbing in the knee at that time but no fevers or chills.  Patient also reports some right wrist pain which has increased due to the use of a walker.  Localizes the pain at the base of the thumb..                ROS: All systems reviewed are negative as they relate to the chief complaint within the history of present illness.  Patient denies fevers or chills.  Assessment & Plan: Visit Diagnoses:  1. Left knee pain, unspecified chronicity   2. 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  No acute fracture.  CMC joint is injected today under ultrasound guidance.  No pictures were saved.  Left knee looks good x-ray wise and is possible this could be coming from her back.  She is having no groin symptoms and nothing on exam to suggest this is referred pain from the hip.  She will follow-up as needed.  Follow-Up Instructions: No follow-ups on file.   Orders:  Orders Placed This Encounter  Procedures   XR Wrist Complete Right   XR Knee 1-2 Views Left   US Guided Needle Placement - No Linked Charges    No orders of the defined types were placed in this encounter.     Procedures: Small Joint Inj: R thumb CMC on 08/28/2022 5:03 PM Indications: pain Details: 25 G needle, radial approach  Spinal Needle: No  Medications: 3 mL lidocaine 1 %; 0.33 mL bupivacaine 0.25 %; 13.33 mg methylPREDNISolone acetate 40 MG/ML Outcome: tolerated well, no immediate complications Procedure, treatment alternatives, risks and benefits explained, specific risks discussed. Consent was given by the patient. Immediately prior to procedure a time out was called to verify the correct patient, procedure, equipment, support staff and site/side marked as required. Patient was prepped and draped in the usual sterile fashion.       Clinical Data: No additional findings.  Objective: Vital Signs: There were no vitals taken for this visit.  Physical Exam:  Constitutional: Patient appears well-developed HEENT:  Head: Normocephalic Eyes:EOM are normal Neck: Normal range of motion Cardiovascular: Normal rate Pulmonary/chest: Effort normal Neurologic: Patient is alert Skin: Skin is warm Psychiatric: Patient has normal mood and affect  Ortho Exam: Ortho exam demonstrates positive grind test on that right CMC joint.  EPL FPL interosseous strength  is intact.  Radial pulses intact.  She has pretty good wrist range of motion with about 70 of extension and 75 of flexion.  Left knee is examined.  No effusion.  Flexes to 90.  No warmth to the knee.  No groin pain with internal or external rotation of that leg.  Both hip motions are little bit diminished.  She has some venous stasis changes in both lower extremities and multiple incisions around the left ankle.  Specialty Comments:  No specialty comments available.  Imaging: No results found.   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   Incomplete emptying of bladder 07/30/2016   Left foot pain 12/11/2015   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   Stopped smoking with greater than 40 pack year history 10/23/2015   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   Mixed incontinence urge and stress 09/16/2011   Cancer of right breast, stage 2 05/28/2011   DCIS (ductal carcinoma in situ) of breast 05/28/2011   Hypothyroid 05/28/2011   GERD (gastroesophageal reflux disease) 05/28/2011   Fibromyalgia 05/28/2011   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   Mild depression 05/28/2011   Thrombocytopenia, unspecified 05/28/2011   Morbid obesity 04/10/2011   GERD 11/09/2009   ALLERGIC RHINITIS 08/23/2007   ASTHMA 08/23/2007   COUGH 08/23/2007   SKIN CANCER, HX OF 08/23/2007   Past Medical History:  Diagnosis Date   ACS (acute coronary syndrome) 04/11/2017   ALLERGIC RHINITIS 08/23/2007   Qualifier: Diagnosis of  By: Thad Ranger LPN, Megan     Anxiety    Arthritis    Asthma    ASTHMA 08/23/2007   Qualifier: Diagnosis of  By: Thad Ranger LPN, Megan     Bigeminy 04/10/2017   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   Cancer of right breast, stage 2 05/28/2011   2.8 cm invasive 1 node pos ER pos S/P mastectomy 06/1995 then AC x 4 then Tam X 5    Chest pain, rule out acute myocardial infarction 04/10/2017   Chronic constipation    Chronic cystitis 04/09/2017   Cough 08/23/2007   Qualifier: Diagnosis  of  By: Shelle Iron MD, Maree Krabbe    Depression    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   Dysrhythmia 2020   Had Afib   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   Fibromyalgia    GERD (gastroesophageal reflux disease)    History of adenomatous polyp of colon 09/30/2018   History of breast cancer no recurrence   1997  right breast cancer  s/p  mastectomy w/ snl dissection and chemoradiation/  2005  left breast cancer DCIS  s/p  lumpectomy and radiation   History of colon polyps    2007 hyperplagia   History of esophageal dilatation    2008   History of peptic  ulcer    History of small bowel obstruction    2012   Hypothyroid 05/28/2011   Hypothyroidism    Incomplete emptying of bladder 07/30/2016   Left foot pain 12/11/2015   Overview:  Dr Candis Shine [facility]   Macular degeneration of right eye    Mild depression 05/28/2011   Mixed incontinence urge and stress 09/16/2011   S/p lumax desires surgery    Morbid obesity 04/10/2011   Numbness of left foot    4 toes   Osteoporosis 12/15/2016   Overview:  Dr Estanislado Pandy   Paget's disease of vulva    Personal history of chemotherapy 1997   Personal history of radiation therapy 2005   PONV (postoperative nausea and vomiting)    and HARD TO WAKE   Seasonal allergies    Shortness of breath    SKIN CANCER, HX OF 08/23/2007   Qualifier: Diagnosis of  By: Thad Ranger LPN, Megan     SUI (stress urinary incontinence, female)    Thrombocytopenic disorder 02/25/2017   Wears glasses     Family History  Problem Relation Age of Onset   Kidney cancer Father 7       metastatic    Alzheimer's disease Mother    Other Mother        ? Cancer   Heart attack Maternal Grandmother    Alcohol abuse Brother    Bladder Cancer Brother        dx 64s   Breast cancer Paternal Aunt        dx after 82   Bladder Cancer Paternal Aunt        dx after 47   Breast cancer Cousin        two paternal female  cousins; unknown age of diagnosis   Cancer Maternal Aunt        ? gallbladder; dx 72s   Cancer Maternal Uncle        unknown type; ? pancreatic ? colon; dx after 11   Brain cancer Paternal Uncle        unknown age of dx   Melanoma Brother        dx late 50s-early 65s   Bone cancer Maternal Uncle        dx after 27   Leukemia Maternal Uncle        dx after 50   Bone cancer Maternal Uncle        dx after 50   Lung cancer Maternal Uncle        dx after 80   Brain cancer Cousin        two maternal female cousins; dx after 69   Stomach cancer Paternal Aunt        unknown age of dx    Past Surgical History:  Procedure Laterality Date   BLADDER SUSPENSION  10/13/2011   Procedure: TRANSVAGINAL TAPE (TVT) PROCEDURE;  Surgeon: Purcell Nails, MD;  Location: [facility];  Service: Gynecology;  Laterality: N/A;   BREAST BIOPSY Left 03/19/2004   BREAST LUMPECTOMY Left 03-27-2004   CARDIAC CATHETERIZATION  10-30-2000  dr Donnie Aho   normal coronary arteries and LVF/  ef 65-70%   CLOSED MANIPULATION  W/ OPEN REDUCTION EXCHANGE TIBIAL FEMORAL BEARING POST TOTAL LEFT KNEE  04-14-2001   CYSTOSCOPY  10/13/2011   Procedure: CYSTOSCOPY;  Surgeon: Purcell Nails, MD;  Location: [facility];  Service: Gynecology;  Laterality: Bilateral;   DIAGNOSTIC LAPAROSCOPY     Colon d/t SBO   DILATION AND CURETTAGE  OF UTERUS  1968   FOOT SURGERY Left 02-01-2009   TRIPLE ARTHRODESIS   FOOT SURGERY Left 11/14   I&D left foot with woundvac placement   INCISIONAL HERNIA REPAIR  05/05/2011   Procedure: LAPAROSCOPIC INCISIONAL HERNIA;  Surgeon: Mariella SaaBenjamin T Hoxworth, MD;  Location: [facility];  Service: General;  Laterality: N/A;  LAPAROSCOPIC REPAIR INCARCERATED INCISIONAL HERNIA  WITH MESH   KNEE ARTHROSCOPY Bilateral left 01-1984 & 04-1992/   right 09-1991   LAPAROSCOPIC CHOLECYSTECTOMY  06-21-2010   LEFT HEART CATH AND CORONARY ANGIOGRAPHY N/A 04/13/2017   Procedure: LEFT HEART CATH AND CORONARY ANGIOGRAPHY;  Surgeon:  Corky CraftsVaranasi, Jayadeep S, MD;  Location: [facility];  Service: Cardiovascular;  Laterality: N/A;   LUMBAR SPINE SURGERY  1998   L5 -- S1   MASTECTOMY Right 1997   W/  LYMPH NODE DISSECTION AND RECONSTRUCTION WITH IMPLANTS AND EXPANDER   NEGATIVE SLEEP STUDY  12/26/2015   PORT-A-CATH PLACEMENT AND REMOVAL  1997   REMOVAL RIGHT BREAST IMPLANT AND CAPSULECTOMY  06-03-1999   REVERSE SHOULDER ARTHROPLASTY Left 10/25/2019   Procedure: LEFT REVERSE SHOULDER ARTHROPLASTY;  Surgeon: Cammy Copaean, Demarquis Osley Scott, MD;  Location: [facility];  Service: Orthopedics;  Laterality: Left;   REVISION TOTAL KNEE ARTHROPLASTY Left 07-07-2005  &  12-10-2001   12-10-2001  REMOVAL TOTAL KNEE/ I&D / INSERTION ANTIBIOTIC SPACER   REVISION TOTAL KNEE ARTHROPLASTY Right 07-07-2005   ROTATOR CUFF REPAIR Right    TENDON REPAIR RIGHT RING FINGER  2011   THUMB TRIGGER RELEASE Right 1993   THYROIDECTOMY, PARTIAL  1983   TONSILLECTOMY  1968   TOTAL KNEE ARTHROPLASTY Bilateral right 05-04-2000/  left 03-29-2001   VULVECTOMY  05/27/2012   Procedure: WIDE EXCISION VULVECTOMY;  Surgeon: Rejeana BrockPaola A. Duard BradyGehrig, MD;  Location: [facility];  Service: Gynecology;  Laterality: N/A;  Wide Local Excision of Vulva    VULVECTOMY N/A 11/23/2012   Procedure: WIDE LOCAL EXCISION OF THE VULVA;  Surgeon: Rejeana BrockPaola A. Duard BradyGehrig, MD;  Location: [facility];  Service: Gynecology;  Laterality: N/A;  left inferior vulvar biopsy excision left superior lesion left inferior vulvar excision   VULVECTOMY N/A 08/02/2013   Procedure: WIDE LOCAL  EXCISION VULVA;  Surgeon: Rejeana BrockPaola A. Duard BradyGehrig, MD;  Location: [facility];  Service: Gynecology;  Laterality: N/A;   VULVECTOMY Left 03/29/2014   Procedure: WIDE LOCAL EXCISION OF LEFT VULVA ;  Surgeon: Rejeana BrockPaola A. Duard BradyGehrig, MD;  [facility] [place];  Service: Gynecology;  Laterality: Left;   VULVECTOMY Bilateral 01/24/2015   Procedure: WIDE LOCAL EXCISION VULVA;  Surgeon: Cleda MccreedyPaola Gehrig, MD;  [facility] [place];  Service:  Gynecology;  Laterality: Bilateral;   VULVECTOMY PARTIAL  07-16-2010   Social History   Occupational History   Not on file  Tobacco Use   Smoking status: Former    Packs/day: 2.00    Years: 41.00    Additional pack years: 0.00    Total pack years: 82.00    Types: Cigarettes    Quit date: 06/05/1999    Years since quitting: 23.2   Smokeless tobacco: Never  Vaping Use   Vaping Use: Never used  Substance and Sexual Activity   Alcohol use: No   Drug use: No   Sexual activity: Not Currently    Birth control/protection: Post-menopausal

## 2022-09-01 ENCOUNTER — Other Ambulatory Visit: Payer: Self-pay | Admitting: *Deleted

## 2022-09-01 DIAGNOSIS — D696 Thrombocytopenia, unspecified: Secondary | ICD-10-CM

## 2022-09-01 DIAGNOSIS — D0510 Intraductal carcinoma in situ of unspecified breast: Secondary | ICD-10-CM

## 2022-09-02 ENCOUNTER — Inpatient Hospital Stay: Payer: Medicare HMO | Attending: Gynecologic Oncology

## 2022-09-02 ENCOUNTER — Inpatient Hospital Stay (HOSPITAL_BASED_OUTPATIENT_CLINIC_OR_DEPARTMENT_OTHER): Payer: Medicare HMO | Admitting: Adult Health

## 2022-09-02 ENCOUNTER — Other Ambulatory Visit: Payer: Self-pay

## 2022-09-02 ENCOUNTER — Telehealth: Payer: Self-pay | Admitting: Adult Health

## 2022-09-02 VITALS — BP 144/72 | HR 96 | Temp 99.4°F | Wt 250.1 lb

## 2022-09-02 DIAGNOSIS — Z853 Personal history of malignant neoplasm of breast: Secondary | ICD-10-CM | POA: Diagnosis not present

## 2022-09-02 DIAGNOSIS — Z08 Encounter for follow-up examination after completed treatment for malignant neoplasm: Secondary | ICD-10-CM | POA: Insufficient documentation

## 2022-09-02 DIAGNOSIS — D0510 Intraductal carcinoma in situ of unspecified breast: Secondary | ICD-10-CM | POA: Diagnosis not present

## 2022-09-02 DIAGNOSIS — Z87891 Personal history of nicotine dependence: Secondary | ICD-10-CM | POA: Diagnosis not present

## 2022-09-02 DIAGNOSIS — D696 Thrombocytopenia, unspecified: Secondary | ICD-10-CM

## 2022-09-02 LAB — CMP (CANCER CENTER ONLY)
ALT: 18 U/L (ref 0–44)
AST: 17 U/L (ref 15–41)
Albumin: 4.2 g/dL (ref 3.5–5.0)
Alkaline Phosphatase: 89 U/L (ref 38–126)
Anion gap: 5 (ref 5–15)
BUN: 16 mg/dL (ref 8–23)
CO2: 33 mmol/L — ABNORMAL HIGH (ref 22–32)
Calcium: 9.8 mg/dL (ref 8.9–10.3)
Chloride: 105 mmol/L (ref 98–111)
Creatinine: 0.7 mg/dL (ref 0.44–1.00)
GFR, Estimated: >60 mL/min (ref >60)
Glucose, Bld: 108 mg/dL — ABNORMAL HIGH (ref 70–99)
Potassium: 4.6 mmol/L (ref 3.5–5.1)
Sodium: 143 mmol/L (ref 135–145)
Total Bilirubin: 0.4 mg/dL (ref 0.3–1.2)
Total Protein: 6.6 g/dL (ref 6.5–8.1)

## 2022-09-02 LAB — CBC WITH DIFFERENTIAL (CANCER CENTER ONLY)
Abs Immature Granulocytes: 0.02 10*3/uL (ref 0.00–0.07)
Basophils Absolute: 0 10*3/uL (ref 0.0–0.1)
Basophils Relative: 0 %
Eosinophils Absolute: 0.1 10*3/uL (ref 0.0–0.5)
Eosinophils Relative: 2 %
HCT: 40.1 % (ref 36.0–46.0)
Hemoglobin: 13.1 g/dL (ref 12.0–15.0)
Immature Granulocytes: 0 %
Lymphocytes Relative: 14 %
Lymphs Abs: 0.7 10*3/uL (ref 0.7–4.0)
MCH: 29.2 pg (ref 26.0–34.0)
MCHC: 32.7 g/dL (ref 30.0–36.0)
MCV: 89.3 fL (ref 80.0–100.0)
Monocytes Absolute: 0.3 10*3/uL (ref 0.1–1.0)
Monocytes Relative: 6 %
Neutro Abs: 3.7 10*3/uL (ref 1.7–7.7)
Neutrophils Relative %: 78 %
Platelet Count: 138 10*3/uL — ABNORMAL LOW (ref 150–400)
RBC: 4.49 MIL/uL (ref 3.87–5.11)
RDW: 13.7 % (ref 11.5–15.5)
WBC Count: 4.8 10*3/uL (ref 4.0–10.5)
nRBC: 0 % (ref 0.0–0.2)

## 2022-09-02 NOTE — Telephone Encounter (Signed)
Scheduled appointment per 4/9 los. Patient is aware of the made appointments.

## 2022-09-02 NOTE — Progress Notes (Signed)
[facility] Follow up:    Vanessa Inch, MD [address] [place] [address]   DIAGNOSIS: History of right breast invasive and left breast non invasive breast cancers  SUMMARY OF ONCOLOGIC HISTORY: [name] woman   (1) status post right mastectomy and axillary lymph node dissection in 1997 for a stage II, estrogen receptor positive invasive breast cancer, treated adjuvantly with chemotherapy (agents not retrievable), followed by tamoxifen for 6 years   (2) status post left excisional biopsy 03/28/2004 for ductal carcinoma in situ, grade 2, measuring 2 cm, with negative margins, estrogen and progesterone receptor positive. She received letrozole x6 years after this.   (3) greater than 40-pack-year smoking history, the patient quitting in 2001   (4) genetic testing 05/18/2020 through the [facility] found no deleterious mutations in AIP, ALK, APC, ATM, AXIN2,BAP1,  BARD1, BLM, BMPR1A, BRCA1, BRCA2, BRIP1, CASR, CDC73, CDH1, CDK4, CDKN1B, CDKN1C, CDKN2A (p14ARF), CDKN2A (p16INK4a), CEBPA, CHEK2, CTNNA1, DICER1, DIS3L2, EGFR (c.2369C>T, p.Thr790Met variant only), EPCAM (Deletion/duplication testing only), FH, FLCN, GATA2, GPC3, GREM1 (Promoter region deletion/duplication testing only), HOXB13 (c.251G>A, p.Gly84Glu), HRAS, KIT, MAX, MEN1, MET, MITF (c.952G>A, p.Glu318Lys variant only), MLH1, MSH2, MSH3, MSH6, MUTYH, NBN, NF1, NF2, NTHL1, PALB2, PDGFRA, PHOX2B, PMS2, POLD1, POLE, POT1, PRKAR1A, PTCH1, PTEN, RAD50, RAD51C, RAD51D, RB1, RECQL4, RET, RNF43, RUNX1, SDHAF2, SDHA (sequence changes only), SDHB, SDHC, SDHD, SMAD4, SMARCA4, SMARCB1, SMARCE1, STK11, SUFU, TERC, TERT, TMEM127, TP53, TSC1, TSC2, VHL, WRN and WT1.  (A) Variant of uncertain significance detected in [facility] at c.295T>C (p.Tyr99His).   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  She has  undergone follow-up with GYN oncology and repeat biopsies and treatment since 2012.  Most recently using Aldara.  Please see Dr. Winferd Humphrey notes in epic for more detail.  CURRENT THERAPY: observation  INTERVAL HISTORY: [name] 77 y.o. female returns for follow-up of her previously decreased platelet count.  Since her last visit we have tested her white blood cells and platelets her white blood cells have been normal hemoglobin is normal and her platelet count is very mildly decreased 138 which is likely a normal variation for her.  Her most recent left breast screening mammogram occurred on January 29, 2022 showing no mammographic evidence of malignancy and breast density category B.   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   Incomplete emptying of bladder 07/30/2016   Left foot pain 12/11/2015   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   Stopped smoking with greater than 40 pack year history 10/23/2015   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   Mixed incontinence urge and stress 09/16/2011   Cancer of right breast, stage 2 05/28/2011   DCIS (ductal carcinoma in situ) of breast 05/28/2011   Hypothyroid 05/28/2011   GERD (gastroesophageal reflux disease) 05/28/2011   Fibromyalgia 05/28/2011  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   Cancer of right breast, stage 2 05/28/2011   2.8 cm invasive 1 node pos ER pos S/P mastectomy 06/1995 then AC x 4 then Tam X 5    Chest pain, rule out acute myocardial infarction 04/10/2017   Chronic constipation    Chronic cystitis 04/09/2017   Cough 08/23/2007   Qualifier: Diagnosis of  By: Shelle Iron MD, Maree Krabbe    Depression    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   Dysrhythmia 2020   Had Afib   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   Fibromyalgia    GERD (gastroesophageal reflux disease)    History of adenomatous polyp of colon 09/30/2018   History of breast cancer no recurrence   1997  right breast cancer  s/p  mastectomy w/ snl dissection and chemoradiation/  2005  left breast cancer DCIS  s/p  lumpectomy and radiation   History of colon polyps    2007 hyperplagia   History of esophageal dilatation    2008   History of peptic ulcer    History of small bowel obstruction    2012   Hypothyroid 05/28/2011   Hypothyroidism    Incomplete emptying of bladder 07/30/2016   Left foot pain 12/11/2015   Overview:  Dr Candis Shine [facility]   Macular degeneration of right eye    Mild depression 05/28/2011    Mixed incontinence urge and stress 09/16/2011   S/p lumax desires surgery    Morbid obesity 04/10/2011   Numbness of left foot    4 toes   Osteoporosis 12/15/2016   Overview:  Dr Estanislado Pandy   Paget's disease of vulva    Personal history of chemotherapy 1997   Personal history of radiation therapy 2005   PONV (postoperative nausea and vomiting)    and HARD TO WAKE   Seasonal allergies    Shortness of breath    SKIN CANCER, HX OF 08/23/2007   Qualifier: Diagnosis of  By: Thad Ranger LPN, Megan     SUI (stress urinary incontinence, female)    Thrombocytopenic disorder 02/25/2017   Wears glasses     SURGICAL HISTORY: Past Surgical History:  Procedure Laterality Date   BLADDER SUSPENSION  10/13/2011   Procedure: TRANSVAGINAL TAPE (TVT) PROCEDURE;  Surgeon: Purcell Nails, MD;  Location: [facility];  Service: Gynecology;  Laterality: N/A;   BREAST BIOPSY Left 03/19/2004   BREAST LUMPECTOMY Left 03-27-2004   CARDIAC CATHETERIZATION  10-30-2000  dr Donnie Aho   normal coronary arteries and LVF/  ef 65-70%   CLOSED MANIPULATION  W/ OPEN REDUCTION EXCHANGE TIBIAL FEMORAL BEARING POST TOTAL LEFT KNEE  04-14-2001  CYSTOSCOPY  10/13/2011   Procedure: CYSTOSCOPY;  Surgeon: Purcell NailsAngela Y Roberts, MD;  Location: [facility];  Service: Gynecology;  Laterality: Bilateral;   DIAGNOSTIC LAPAROSCOPY     Colon d/t SBO   DILATION AND CURETTAGE OF UTERUS  1968   FOOT SURGERY Left 02-01-2009   TRIPLE ARTHRODESIS   FOOT SURGERY Left 11/14   I&D left foot with woundvac placement   INCISIONAL HERNIA REPAIR  05/05/2011   Procedure: LAPAROSCOPIC INCISIONAL HERNIA;  Surgeon: Mariella SaaBenjamin T Hoxworth, MD;  Location: [facility];  Service: General;  Laterality: N/A;  LAPAROSCOPIC REPAIR INCARCERATED INCISIONAL HERNIA  WITH MESH   KNEE ARTHROSCOPY Bilateral left 01-1984 & 04-1992/   right 09-1991   LAPAROSCOPIC CHOLECYSTECTOMY  06-21-2010   LEFT HEART CATH AND CORONARY ANGIOGRAPHY N/A 04/13/2017   Procedure: LEFT HEART CATH AND CORONARY  ANGIOGRAPHY;  Surgeon: Corky CraftsVaranasi, Jayadeep S, MD;  Location: [facility];  Service: Cardiovascular;  Laterality: N/A;   LUMBAR SPINE SURGERY  1998   L5 -- S1   MASTECTOMY Right 1997   W/  LYMPH NODE DISSECTION AND RECONSTRUCTION WITH IMPLANTS AND EXPANDER   NEGATIVE SLEEP STUDY  12/26/2015   PORT-A-CATH PLACEMENT AND REMOVAL  1997   REMOVAL RIGHT BREAST IMPLANT AND CAPSULECTOMY  06-03-1999   REVERSE SHOULDER ARTHROPLASTY Left 10/25/2019   Procedure: LEFT REVERSE SHOULDER ARTHROPLASTY;  Surgeon: Cammy Copaean, Gregory Scott, MD;  Location: [facility];  Service: Orthopedics;  Laterality: Left;   REVISION TOTAL KNEE ARTHROPLASTY Left 07-07-2005  &  12-10-2001   12-10-2001  REMOVAL TOTAL KNEE/ I&D / INSERTION ANTIBIOTIC SPACER   REVISION TOTAL KNEE ARTHROPLASTY Right 07-07-2005   ROTATOR CUFF REPAIR Right    TENDON REPAIR RIGHT RING FINGER  2011   THUMB TRIGGER RELEASE Right 1993   THYROIDECTOMY, PARTIAL  1983   TONSILLECTOMY  1968   TOTAL KNEE ARTHROPLASTY Bilateral right 05-04-2000/  left 03-29-2001   VULVECTOMY  05/27/2012   Procedure: WIDE EXCISION VULVECTOMY;  Surgeon: Rejeana BrockPaola A. Duard BradyGehrig, MD;  Location: [facility];  Service: Gynecology;  Laterality: N/A;  Wide Local Excision of Vulva    VULVECTOMY N/A 11/23/2012   Procedure: WIDE LOCAL EXCISION OF THE VULVA;  Surgeon: Rejeana BrockPaola A. Duard BradyGehrig, MD;  Location: [facility];  Service: Gynecology;  Laterality: N/A;  left inferior vulvar biopsy excision left superior lesion left inferior vulvar excision   VULVECTOMY N/A 08/02/2013   Procedure: WIDE LOCAL  EXCISION VULVA;  Surgeon: Rejeana BrockPaola A. Duard BradyGehrig, MD;  Location: [facility];  Service: Gynecology;  Laterality: N/A;   VULVECTOMY Left 03/29/2014   Procedure: WIDE LOCAL EXCISION OF LEFT VULVA ;  Surgeon: Rejeana BrockPaola A. Duard BradyGehrig, MD;  [facility];  Service: Gynecology;  Laterality: Left;   VULVECTOMY Bilateral 01/24/2015   Procedure: WIDE LOCAL EXCISION VULVA;  Surgeon: Cleda MccreedyPaola Gehrig, MD;  [facility];  Service: Gynecology;  Laterality: Bilateral;   VULVECTOMY PARTIAL  07-16-2010    SOCIAL HISTORY: Social History   Socioeconomic History   Marital status: Married    Spouse name: Not on file   Number of children: Not on file   Years of education: Not on file   Highest education level: Not on file  Occupational History   Not on file  Tobacco Use   Smoking status: Former    Packs/day: 2.00    Years: 41.00    Additional pack years: 0.00    Total pack years: 82.00    Types: Cigarettes    Quit date: 06/05/1999  Years since quitting: 23.2   Smokeless tobacco: Never  Vaping Use   Vaping Use: Never used  Substance and Sexual Activity   Alcohol use: No   Drug use: No   Sexual activity: Not Currently    Birth control/protection: Post-menopausal  Other Topics Concern   Not on file  Social History Narrative   Not on file   Social Determinants of Health   Financial Resource Strain: Not on file  Food Insecurity: Not on file  Transportation Needs: Not on file  Physical Activity: Not on file  Stress: Not on file  Social Connections: Not on file  Intimate Partner Violence: Not on file    FAMILY HISTORY: Family History  Problem Relation Age of Onset   Kidney cancer Father 26       metastatic    Alzheimer's disease Mother    Other Mother        ? Cancer   Heart attack Maternal Grandmother    Alcohol abuse Brother    Bladder Cancer Brother        dx 70s   Breast cancer Paternal Aunt        dx after 68   Bladder Cancer Paternal Aunt        dx after 28   Breast cancer Cousin        two paternal female cousins; unknown age of diagnosis   Cancer Maternal Aunt        ? gallbladder; dx 55s   Cancer Maternal Uncle        unknown type; ? pancreatic ? colon; dx after 25   Brain cancer Paternal Uncle        unknown age of dx   Melanoma Brother        dx late 50s-early 19s   Bone cancer Maternal Uncle        dx after 72   Leukemia Maternal Uncle        dx after  50   Bone cancer Maternal Uncle        dx after 50   Lung cancer Maternal Uncle        dx after 22   Brain cancer Cousin        two maternal female cousins; dx after 39   Stomach cancer Paternal Aunt        unknown age of dx    Review of Systems  Constitutional:  Negative for appetite change, chills, fatigue, fever and unexpected weight change.  HENT:   Negative for hearing loss, lump/mass and trouble swallowing.   Eyes:  Negative for eye problems and icterus.  Respiratory:  Negative for chest tightness, cough and shortness of breath.   Cardiovascular:  Negative for chest pain, leg swelling and palpitations.  Gastrointestinal:  Negative for abdominal distention, abdominal pain, constipation, diarrhea, nausea and vomiting.  Endocrine: Negative for hot flashes.  Genitourinary:  Negative for difficulty urinating.   Musculoskeletal:  Negative for arthralgias.  Skin:  Negative for itching and rash.  Neurological:  Negative for dizziness, extremity weakness, headaches and numbness.  Hematological:  Negative for adenopathy. Does not bruise/bleed easily.  Psychiatric/Behavioral:  Negative for depression. The patient is not nervous/anxious.       PHYSICAL EXAMINATION  ECOG PERFORMANCE STATUS: 0 - Asymptomatic  Vitals:   09/02/22 1111  BP: (!) 144/72  Pulse: 96  Temp: 99.4 F (37.4 C)  SpO2: 94%    Physical Exam Constitutional:      General: She is not in  acute distress.    Appearance: Normal appearance. She is not toxic-appearing.  HENT:     Head: Normocephalic and atraumatic.  Eyes:     General: No scleral icterus. Cardiovascular:     Rate and Rhythm: Normal rate and regular rhythm.     Pulses: Normal pulses.     Heart sounds: Normal heart sounds.  Pulmonary:     Effort: Pulmonary effort is normal.     Breath sounds: Normal breath sounds.  Abdominal:     General: Abdomen is flat. Bowel sounds are normal. There is no distension.     Palpations: Abdomen is soft.      Tenderness: There is no abdominal tenderness.  Musculoskeletal:        General: No swelling.     Cervical back: Neck supple.  Lymphadenopathy:     Cervical: No cervical adenopathy.  Skin:    General: Skin is warm and dry.     Findings: No rash.  Neurological:     General: No focal deficit present.     Mental Status: She is alert.  Psychiatric:        Mood and Affect: Mood normal.        Behavior: Behavior normal.     LABORATORY DATA:  CBC    Component Value Date/Time   WBC 4.8 09/02/2022 1037   WBC 4.2 10/21/2019 1133   RBC 4.49 09/02/2022 1037   HGB 13.1 09/02/2022 1037   HGB 13.4 10/23/2015 1542   HCT 40.1 09/02/2022 1037   HCT 40.8 10/23/2015 1542   PLT 138 (L) 09/02/2022 1037   PLT 138 (L) 10/23/2015 1542   MCV 89.3 09/02/2022 1037   MCV 87.3 10/23/2015 1542   MCH 29.2 09/02/2022 1037   MCHC 32.7 09/02/2022 1037   RDW 13.7 09/02/2022 1037   RDW 13.6 10/23/2015 1542   LYMPHSABS 0.7 09/02/2022 1037   LYMPHSABS 1.4 10/23/2015 1542   MONOABS 0.3 09/02/2022 1037   MONOABS 0.3 10/23/2015 1542   EOSABS 0.1 09/02/2022 1037   EOSABS 0.1 10/23/2015 1542   BASOSABS 0.0 09/02/2022 1037   BASOSABS 0.0 10/23/2015 1542    CMP     Component Value Date/Time   NA 143 09/02/2022 1037   NA 140 08/18/2022 1631   NA 143 10/23/2015 1542   K 4.6 09/02/2022 1037   K 4.7 10/23/2015 1542   CL 105 09/02/2022 1037   CO2 33 (H) 09/02/2022 1037   CO2 28 10/23/2015 1542   GLUCOSE 108 (H) 09/02/2022 1037   GLUCOSE 97 10/23/2015 1542   BUN 16 09/02/2022 1037   BUN 12 08/18/2022 1631   BUN 12.2 10/23/2015 1542   CREATININE 0.70 09/02/2022 1037   CREATININE 0.7 10/23/2015 1542   CALCIUM 9.8 09/02/2022 1037   CALCIUM 9.7 10/23/2015 1542   PROT 6.6 09/02/2022 1037   PROT 6.6 10/23/2015 1542   ALBUMIN 4.2 09/02/2022 1037   ALBUMIN 3.9 10/23/2015 1542   AST 17 09/02/2022 1037   AST 17 10/23/2015 1542   ALT 18 09/02/2022 1037   ALT 14 10/23/2015 1542   ALKPHOS 89 09/02/2022  1037   ALKPHOS 85 10/23/2015 1542   BILITOT 0.4 09/02/2022 1037   BILITOT 0.32 10/23/2015 1542   GFRNONAA >60 09/02/2022 1037   GFRAA >60 10/21/2019 1133      ASSESSMENT and THERAPY PLAN:   DCIS (ductal carcinoma in situ) of breast [name] is a 77 year old woman with history of stage II invasive breast cancer from 1997, noninvasive breast cancer  from 2005 and vulvar Paget's disease followed by Dr. Pricilla Holm and GYN oncology.  We reviewed her labs in detail and noted that her platelet count is only mildly decreased therefore recommended that we see her back once a year for labs and long-term survivorship.  Way we will keep an eye on her platelet count but also be available if she has any new breast changes or concerns.  She verbalized understanding of this.  We had a long discussion on different healthcare experiences that she had and she is grateful to have reestablished care with [place] for her history of breast cancer.  She will continue to follow-up with annual mammograms.   All questions were answered. The patient knows to call the clinic with any problems, questions or concerns. We can certainly see the patient much sooner if necessary.  Total encounter time:30 minutes*in face-to-face visit time, chart review, lab review, care coordination, order entry, and documentation of the encounter time.    Lillard Anes, NP 09/03/22 1:19 PM Medical Oncology and Hematology [facility] [address] [place], [place] [address] Tel. [phone]    Fax. [phone]  *Total Encounter Time as defined by the Centers for Medicare and Medicaid Services includes, in addition to the face-to-face time of a patient visit (documented in the note above) non-face-to-face time: obtaining and reviewing outside history, ordering and reviewing medications, tests or procedures, care coordination (communications with other health care professionals or caregivers) and documentation in the medical  record.

## 2022-09-03 ENCOUNTER — Encounter: Payer: Self-pay | Admitting: Adult Health

## 2022-09-03 NOTE — Assessment & Plan Note (Signed)
[name] is a 77 year old woman with history of stage II invasive breast cancer from 1997, noninvasive breast cancer from 2005 and vulvar Paget's disease followed by Dr. Pricilla Holm and GYN oncology.  We reviewed her labs in detail and noted that her platelet count is only mildly decreased therefore recommended that we see her back once a year for labs and long-term survivorship.  Way we will keep an eye on her platelet count but also be available if she has any new breast changes or concerns.  She verbalized understanding of this.  We had a long discussion on different healthcare experiences that she had and she is grateful to have reestablished care with [place] for her history of breast cancer.  She will continue to follow-up with annual mammograms.

## 2022-09-16 ENCOUNTER — Telehealth: Payer: Self-pay | Admitting: *Deleted

## 2022-09-16 NOTE — Telephone Encounter (Signed)
Patient called and canceled appt for 4/25. Patient stated that she will call the office back to reschedule

## 2022-09-18 ENCOUNTER — Inpatient Hospital Stay: Payer: Medicare HMO | Admitting: Gynecologic Oncology

## 2022-12-08 ENCOUNTER — Other Ambulatory Visit: Payer: Self-pay | Admitting: Obstetrics and Gynecology

## 2022-12-08 DIAGNOSIS — M8588 Other specified disorders of bone density and structure, other site: Secondary | ICD-10-CM

## 2022-12-25 ENCOUNTER — Ambulatory Visit: Payer: Medicare HMO | Admitting: Surgical

## 2022-12-25 ENCOUNTER — Other Ambulatory Visit (INDEPENDENT_AMBULATORY_CARE_PROVIDER_SITE_OTHER): Payer: Medicare HMO

## 2022-12-25 ENCOUNTER — Other Ambulatory Visit: Payer: Self-pay

## 2022-12-25 DIAGNOSIS — M1811 Unilateral primary osteoarthritis of first carpometacarpal joint, right hand: Secondary | ICD-10-CM | POA: Diagnosis not present

## 2022-12-25 DIAGNOSIS — M1812 Unilateral primary osteoarthritis of first carpometacarpal joint, left hand: Secondary | ICD-10-CM | POA: Diagnosis not present

## 2022-12-25 DIAGNOSIS — M25532 Pain in left wrist: Secondary | ICD-10-CM

## 2022-12-26 ENCOUNTER — Encounter: Payer: Self-pay | Admitting: Orthopedic Surgery

## 2022-12-26 MED ORDER — METHYLPREDNISOLONE ACETATE 40 MG/ML IJ SUSP
13.3300 mg | INTRAMUSCULAR | Status: AC | PRN
Start: 2022-12-25 — End: 2022-12-25
  Administered 2022-12-25: 13.33 mg via INTRA_ARTICULAR

## 2022-12-26 MED ORDER — LIDOCAINE HCL 1 % IJ SOLN
3.0000 mL | INTRAMUSCULAR | Status: AC | PRN
Start: 2022-12-25 — End: 2022-12-25
  Administered 2022-12-25: 3 mL

## 2022-12-26 MED ORDER — BUPIVACAINE HCL 0.25 % IJ SOLN
0.3000 mL | INTRAMUSCULAR | Status: AC | PRN
Start: 2022-12-25 — End: 2022-12-25
  Administered 2022-12-25: .3 mL via INTRA_ARTICULAR

## 2022-12-26 NOTE — Progress Notes (Signed)
Follow-up Office Visit Note   Patient: [name]           Date of Birth: 01/19/1946           MRN: [id number] Visit Date: 12/25/2022 Requested by: Vanessa Inch, MD [address] [place],  [place] [address] PCP: Vanessa Inch, MD  Subjective: Chief Complaint  Patient presents with   Other     Bilateral wrist pain    HPI: [name] is a 77 y.o. female who returns to the office for follow-up visit.    Plan at last visit was: Impression is right wrist CMC pain with some DRUJ arthritis as well. No acute fracture. CMC joint is injected today under ultrasound guidance. No pictures were saved. Left knee looks good x-ray wise and is possible this could be coming from her back. She is having no groin symptoms and nothing on exam to suggest this is referred pain from the hip. She will follow-up as needed.   Since then, patient notes she had great relief of her right wrist pain from the [facility] injection that lasted for about 2 months.  She would like to repeat this today.  She also wants evaluation of her left wrist pain.  She localizes pain similarly to the right wrist at the base of the left thumb and does have some radiation of pain proximally up to about the level of the radial styloid.  She has occasional numbness and tingling around the volar aspect of the wrist with no extension into the fingers or into the dorsum of the wrist.  No history of recent injury or fall.  No radicular pain.              ROS: All systems reviewed are negative as they relate to the chief complaint within the history of present illness.  Patient denies fevers or chills.  Assessment & Plan: Visit Diagnoses:  1. Arthritis of carpometacarpal (CMC) joint of right thumb   2. Pain in left wrist   3. Arthritis of carpometacarpal [facility]) joint of left thumb     Plan: [name] is a 77 y.o. female who returns to the office for follow-up visit for bilateral wrist pain.  Plan from last  visit was noted above in HPI.  They now return with significant relief from right [place] injection at her last visit on//24.  She would like to repeat this today since it gave about 2 months of relief.  She also has left wrist radiographs taken today demonstrating severe degenerative changes of the left first [facility] as well as the radiocarpal and STT and DRUJ joints.  Seems most of her pain is localizing to the [facility] joint in the left wrist.  She denies any history of diabetes.  Plan is ultrasound-guided bilateral CMC injections today.  Patient tolerated procedure well without complication.  Follow-up with the office as needed.   Follow-Up Instructions: No follow-ups on file.   Orders:  Orders Placed This Encounter  Procedures   XR Wrist Complete Left   US Guided Needle Placement - No Linked Charges   No orders of the defined types were placed in this encounter.     Procedures: Small Joint Inj: bilateral thumb CMC on 12/25/2022 9:01 AM Indications: pain Details: 25 G needle, ultrasound-guided radial approach  Spinal Needle: No  Medications (Right): 3 mL lidocaine 1 %; 13.33 mg methylPREDNISolone acetate 40 MG/ML; 0.3 mL bupivacaine 0.25 % Medications (Left): 3 mL  lidocaine 1 %; 13.33 mg methylPREDNISolone acetate 40 MG/ML; 0.3 mL bupivacaine 0.25 % Outcome: tolerated well, no immediate complications Procedure, treatment alternatives, risks and benefits explained, specific risks discussed. Consent was given by the patient. Immediately prior to procedure a time out was called to verify the correct patient, procedure, equipment, support staff and site/side marked as required. Patient was prepped and draped in the usual sterile fashion.       Clinical Data: No additional findings.  Objective: Vital Signs: There were no vitals taken for this visit.  Physical Exam:  Constitutional: Patient appears well-developed HEENT:  Head: Normocephalic Eyes:EOM are normal Neck: Normal range of  motion Cardiovascular: Normal rate Pulmonary/chest: Effort normal Neurologic: Patient is alert Skin: Skin is warm Psychiatric: Patient has normal mood and affect  Ortho Exam: Ortho exam demonstrates right wrist with no evidence of cellulitis or skin changes.  She has pain with thumb circumduction and pain with thumb abduction.  Intact EPL, FPL, finger abduction bilaterally.  There is no significant atrophy of the thenar or hyperthenar regions.  She has negative Durkan and negative Tinel sign bilaterally over the carpal tunnel.  Does have reproduction of pain with thumb circumduction and abduction of the thumb in the left wrist.  Has tenderness at the base of the thumb as well as through the anatomic snuffbox, scaphoid tubercle.  Has fairly well-preserved extension and flexion of the wrist but somewhat limited supination.  Specialty Comments:  No specialty comments available.  Imaging: No results found.   PMFS History: Patient Active Problem List   Diagnosis Date Noted   Personal history of breast cancer 07/16/2020   Family history of brain cancer 07/16/2020   Family history of kidney cancer 07/16/2020   Genetic testing 05/23/2020   S/P reverse total shoulder arthroplasty, left 10/25/2019   History of adenomatous polyp of colon 09/30/2018   Shortness of breath    ACS (acute coronary syndrome) (HCC) 04/11/2017   Chest pain, rule out acute myocardial infarction 04/10/2017   Bigeminy 04/10/2017   Chronic cystitis 04/09/2017   Thrombocytopenic disorder (HCC) 02/25/2017   Osteoporosis 12/15/2016   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   Incomplete emptying of bladder 07/30/2016   Left foot pain 12/11/2015   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   Stopped smoking with greater than 40 pack year history 10/23/2015   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   Mixed incontinence urge and stress 09/16/2011   Cancer of right breast, stage 2 (HCC)  05/28/2011   DCIS (ductal carcinoma in situ) of breast 05/28/2011   Hypothyroid 05/28/2011   GERD (gastroesophageal reflux disease) 05/28/2011   Fibromyalgia 05/28/2011   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   Mild depression 05/28/2011   Thrombocytopenia, unspecified (HCC) 05/28/2011   Morbid obesity (HCC) 04/10/2011   GERD 11/09/2009   ALLERGIC RHINITIS 08/23/2007   ASTHMA 08/23/2007   COUGH 08/23/2007   SKIN CANCER, HX OF 08/23/2007   Past Medical History:  Diagnosis Date   ACS (acute coronary syndrome) (HCC) 04/11/2017   ALLERGIC RHINITIS 08/23/2007   Qualifier: Diagnosis of  By: Thad Ranger LPN, Megan     Anxiety    Arthritis    Asthma    ASTHMA 08/23/2007   Qualifier: Diagnosis of  By: Thad Ranger LPN, Megan     Bigeminy 04/10/2017   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   Cancer of right breast, stage 2 (HCC) 05/28/2011   2.8 cm invasive 1 node pos ER  pos S/P mastectomy 06/1995 then AC x 4 then Tam X 5    Chest pain, rule out acute myocardial infarction 04/10/2017   Chronic constipation    Chronic cystitis 04/09/2017   Cough 08/23/2007   Qualifier: Diagnosis of  By: Shelle Iron MD, Maree Krabbe    Depression    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   Dysrhythmia 2020   Had Afib   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   Fibromyalgia    GERD (gastroesophageal reflux disease)    History of adenomatous polyp of colon 09/30/2018   History of breast cancer no recurrence   1997  right breast cancer  s/p  mastectomy w/ snl dissection and chemoradiation/  2005  left breast cancer DCIS  s/p  lumpectomy and radiation   History of colon polyps    2007 hyperplagia   History of esophageal dilatation    2008   History of peptic ulcer    History of small bowel obstruction    2012   Hypothyroid 05/28/2011   Hypothyroidism    Incomplete emptying of bladder 07/30/2016   Left  foot pain 12/11/2015   Overview:  Dr Candis Shine [facility]   Macular degeneration of right eye    Mild depression 05/28/2011   Mixed incontinence urge and stress 09/16/2011   S/p lumax desires surgery    Morbid obesity (HCC) 04/10/2011   Numbness of left foot    4 toes   Osteoporosis 12/15/2016   Overview:  Dr Estanislado Pandy   Paget's disease of vulva [facility])    Personal history of chemotherapy 1997   Personal history of radiation therapy 2005   PONV (postoperative nausea and vomiting)    and HARD TO WAKE   Seasonal allergies    Shortness of breath    SKIN CANCER, HX OF 08/23/2007   Qualifier: Diagnosis of  By: Thad Ranger LPN, Megan     SUI (stress urinary incontinence, female)    Thrombocytopenic disorder (HCC) 02/25/2017   Wears glasses     Family History  Problem Relation Age of Onset   Kidney cancer Father 65       metastatic    Alzheimer's disease Mother    Other Mother        ? Cancer   Heart attack Maternal Grandmother    Alcohol abuse Brother    Bladder Cancer Brother        dx 82s   Breast cancer Paternal Aunt        dx after 80   Bladder Cancer Paternal Aunt        dx after 30   Breast cancer Cousin        two paternal female cousins; unknown age of diagnosis   Cancer Maternal Aunt        ? gallbladder; dx 87s   Cancer Maternal Uncle        unknown type; ? pancreatic ? colon; dx after 43   Brain cancer Paternal Uncle        unknown age of dx   Melanoma Brother        dx late 50s-early 9s   Bone cancer Maternal Uncle        dx after 33   Leukemia Maternal Uncle        dx after 50   Bone cancer Maternal Uncle        dx after 50   Lung cancer Maternal Uncle  dx after 46   Brain cancer Cousin        two maternal female cousins; dx after 65   Stomach cancer Paternal Aunt        unknown age of dx    Past Surgical History:  Procedure Laterality Date   BLADDER SUSPENSION  10/13/2011   Procedure: TRANSVAGINAL TAPE (TVT) PROCEDURE;  Surgeon: Purcell Nails, MD;   Location: [facility];  Service: Gynecology;  Laterality: N/A;   BREAST BIOPSY Left 03/19/2004   BREAST LUMPECTOMY Left 03-27-2004   CARDIAC CATHETERIZATION  10-30-2000  dr Donnie Aho   normal coronary arteries and LVF/  ef 65-70%   CLOSED MANIPULATION  W/ OPEN REDUCTION EXCHANGE TIBIAL FEMORAL BEARING POST TOTAL LEFT KNEE  04-14-2001   CYSTOSCOPY  10/13/2011   Procedure: CYSTOSCOPY;  Surgeon: Purcell Nails, MD;  Location: [facility];  Service: Gynecology;  Laterality: Bilateral;   DIAGNOSTIC LAPAROSCOPY     Colon d/t SBO   DILATION AND CURETTAGE OF UTERUS  1968   FOOT SURGERY Left 02-01-2009   TRIPLE ARTHRODESIS   FOOT SURGERY Left 11/14   I&D left foot with woundvac placement   INCISIONAL HERNIA REPAIR  05/05/2011   Procedure: LAPAROSCOPIC INCISIONAL HERNIA;  Surgeon: Mariella Saa, MD;  Location: [facility];  Service: General;  Laterality: N/A;  LAPAROSCOPIC REPAIR INCARCERATED INCISIONAL HERNIA  WITH MESH   KNEE ARTHROSCOPY Bilateral left 01-1984 & 04-1992/   right 09-1991   LAPAROSCOPIC CHOLECYSTECTOMY  06-21-2010   LEFT HEART CATH AND CORONARY ANGIOGRAPHY N/A 04/13/2017   Procedure: LEFT HEART CATH AND CORONARY ANGIOGRAPHY;  Surgeon: Corky Crafts, MD;  Location: [facility];  Service: Cardiovascular;  Laterality: N/A;   LUMBAR SPINE SURGERY  1998   L5 -- S1   MASTECTOMY Right 1997   W/  LYMPH NODE DISSECTION AND RECONSTRUCTION WITH IMPLANTS AND EXPANDER   NEGATIVE SLEEP STUDY  12/26/2015   PORT-A-CATH PLACEMENT AND REMOVAL  1997   REMOVAL RIGHT BREAST IMPLANT AND CAPSULECTOMY  06-03-1999   REVERSE SHOULDER ARTHROPLASTY Left 10/25/2019   Procedure: LEFT REVERSE SHOULDER ARTHROPLASTY;  Surgeon: Cammy Copa, MD;  Location: [facility];  Service: Orthopedics;  Laterality: Left;   REVISION TOTAL KNEE ARTHROPLASTY Left 07-07-2005  &  12-10-2001   12-10-2001  REMOVAL TOTAL KNEE/ I&D / INSERTION ANTIBIOTIC SPACER   REVISION TOTAL KNEE ARTHROPLASTY Right 07-07-2005   ROTATOR CUFF  REPAIR Right    TENDON REPAIR RIGHT RING FINGER  2011   THUMB TRIGGER RELEASE Right 1993   THYROIDECTOMY, PARTIAL  1983   TONSILLECTOMY  1968   TOTAL KNEE ARTHROPLASTY Bilateral right 05-04-2000/  left 03-29-2001   VULVECTOMY  05/27/2012   Procedure: WIDE EXCISION VULVECTOMY;  Surgeon: Rejeana Brock A. Duard Brady, MD;  Location: [facility];  Service: Gynecology;  Laterality: N/A;  Wide Local Excision of Vulva    VULVECTOMY N/A 11/23/2012   Procedure: WIDE LOCAL EXCISION OF THE VULVA;  Surgeon: Rejeana Brock A. Duard Brady, MD;  Location: [facility];  Service: Gynecology;  Laterality: N/A;  left inferior vulvar biopsy excision left superior lesion left inferior vulvar excision   VULVECTOMY N/A 08/02/2013   Procedure: WIDE LOCAL  EXCISION VULVA;  Surgeon: Rejeana Brock A. Duard Brady, MD;  Location: [facility];  Service: Gynecology;  Laterality: N/A;   VULVECTOMY Left 03/29/2014   Procedure: WIDE LOCAL EXCISION OF LEFT VULVA ;  Surgeon: Rejeana Brock A. Duard Brady, MD;  Location: [facility];  Service: Gynecology;  Laterality: Left;   VULVECTOMY Bilateral 01/24/2015  Procedure: WIDE LOCAL EXCISION VULVA;  Surgeon: Cleda Mccreedy, MD;  Location: [facility];  Service: Gynecology;  Laterality: Bilateral;   VULVECTOMY PARTIAL  07-16-2010   Social History   Occupational History   Not on file  Tobacco Use   Smoking status: Former    Current packs/day: 0.00    Average packs/day: 2.0 packs/day for 41.0 years (82.0 ttl pk-yrs)    Types: Cigarettes    Start date: 06/04/1958    Quit date: 06/05/1999    Years since quitting: 23.5   Smokeless tobacco: Never  Vaping Use   Vaping status: Never Used  Substance and Sexual Activity   Alcohol use: No   Drug use: No   Sexual activity: Not Currently    Birth control/protection: Post-menopausal

## 2023-02-17 ENCOUNTER — Ambulatory Visit: Payer: Medicare HMO

## 2023-02-17 ENCOUNTER — Encounter: Payer: Self-pay | Admitting: Physician Assistant

## 2023-02-17 ENCOUNTER — Ambulatory Visit: Payer: Medicare HMO | Attending: Physician Assistant | Admitting: Physician Assistant

## 2023-02-17 VITALS — BP 124/62 | HR 68 | Ht 65.0 in | Wt 250.4 lb

## 2023-02-17 DIAGNOSIS — I493 Ventricular premature depolarization: Secondary | ICD-10-CM

## 2023-02-17 DIAGNOSIS — I5032 Chronic diastolic (congestive) heart failure: Secondary | ICD-10-CM

## 2023-02-17 DIAGNOSIS — R0602 Shortness of breath: Secondary | ICD-10-CM | POA: Diagnosis not present

## 2023-02-17 NOTE — Patient Instructions (Addendum)
Medication Instructions:   Your physician recommends that you continue on your current medications as directed. Please refer to the Current Medication list given to you today.  *If you need a refill on your cardiac medications before your next appointment, please call your pharmacy*   Lab Work:  BMET  BNP AND CBC TODAY    If you have labs (blood work) drawn today and your tests are completely normal, you will receive your results only by: MyChart Message (if you have MyChart) OR A paper copy in the mail If you have any lab test that is abnormal or we need to change your treatment, we will call you to review the results.   Testing/Procedures: Your physician has requested that you have an echocardiogram. Echocardiography is a painless test that uses sound waves to create images of your heart. It provides your doctor with information about the size and shape of your heart and how well your heart's chambers and valves are working. This procedure takes approximately one hour. There are no restrictions for this procedure. Please do NOT wear cologne, perfume, aftershave, or lotions (deodorant is allowed). Please arrive 15 minutes prior to your appointment time.  Your physician has recommended that you wear an event monitor. Event monitors are medical devices that record the heart's electrical activity. Doctors most often [place] these monitors to diagnose arrhythmias. Arrhythmias are problems with the speed or rhythm of the heartbeat. The monitor is a small, portable device. You can wear one while you do your normal daily activities. This is usually used to diagnose what is causing palpitations/syncope (passing out).    Follow-Up: At [facility], you and your health needs are our priority.  As part of our continuing mission to provide you with exceptional heart care, we have created designated Provider Care Teams.  These Care Teams include your primary Cardiologist (physician) and Advanced  Practice Providers (APPs -  Physician Assistants and Nurse Practitioners) who all work together to provide you with the care you need, when you need it.  We recommend signing up for the patient portal called "MyChart".  Sign up information is provided on this After Visit Summary.  MyChart is used to connect with patients for Virtual Visits (Telemedicine).  Patients are able to view lab/test results, encounter notes, upcoming appointments, etc.  Non-urgent messages can be sent to your provider as well.   To learn more about what you can do with MyChart, go to [email or web address].    Your next appointment:   6 month(s)  Provider:   You may see Will Jorja Loa, MD or one of the following Advanced Practice Providers on your designated Care Team:   Francis Dowse, [place]  Other Instructions   ZIO XT- Long Term Monitor Instructions  Your physician has requested you wear a ZIO patch monitor for 3 days.  This is a single patch monitor. [employer] supplies one patch monitor per enrollment. Additional stickers are not available. Please do not apply patch if you will be having a Nuclear Stress Test,  Echocardiogram, Cardiac CT, MRI, or Chest Xray during the period you would be wearing the  monitor. The patch cannot be worn during these tests. You cannot remove and re-apply the  ZIO XT patch monitor.  Your ZIO patch monitor will be mailed 3 day USPS to your address on file. It may take 3-5 days  to receive your monitor after you have been enrolled.  Once you have received your monitor, please review the enclosed instructions. Your  monitor  has already been registered assigning a specific monitor serial # to you.  Billing and Patient Assistance Program Information  We have supplied [employer] with any of your insurance information on file for billing purposes. [employer] offers a sliding scale Patient Assistance Program for patients that do not have  insurance, or whose insurance does not completely  cover the cost of the ZIO monitor.  You must apply for the Patient Assistance Program to qualify for this discounted rate.  To apply, please call [employer] at [phone], select option 4, select option 2, ask to apply for  Patient Assistance Program. Meredeth Ide will ask your household income, and how many people  are in your household. They will quote your out-of-pocket cost based on that information.  [employer] will also be able to set up a 83-month, interest-free payment plan if needed.  Applying the monitor   Shave hair from upper left chest.  Hold abrader disc by orange tab. Rub abrader in 40 strokes over the upper left chest as  indicated in your monitor instructions.  Clean area with 4 enclosed alcohol pads. Let dry.  Apply patch as indicated in monitor instructions. Patch will be placed under collarbone on left  side of chest with arrow pointing upward.  Rub patch adhesive wings for 2 minutes. Remove white label marked "1". Remove the white  label marked "2". Rub patch adhesive wings for 2 additional minutes.  While looking in a mirror, press and release button in center of patch. A small green light will  flash 3-4 times. This will be your only indicator that the monitor has been turned on.  Do not shower for the first 24 hours. You may shower after the first 24 hours.  Press the button if you feel a symptom. You will hear a small click. Record Date, Time and  Symptom in the Patient Logbook.  When you are ready to remove the patch, follow instructions on the last 2 pages of Patient  Logbook. Stick patch monitor onto the last page of Patient Logbook.  Place Patient Logbook in the blue and white box. Use locking tab on box and tape box closed  securely. The blue and white box has prepaid postage on it. Please place it in the mailbox as  soon as possible. Your physician should have your test results approximately 7 days after the  monitor has been mailed back to [facility].  Call [facility] Customer Care at [phone] if you have questions regarding  your ZIO XT patch monitor. Call them immediately if you see an orange light blinking on your  monitor.  If your monitor falls off in less than 4 days, contact our Monitor department at [phone].  If your monitor becomes loose or falls off after 4 days call [employer] at [phone] for  suggestions on securing your monitor

## 2023-02-17 NOTE — Progress Notes (Signed)
Cardiology Office Note:  .   Date:  02/17/2023  ID:  [name], DOB June 16, 1945, MRN [id number] PCP: Vanessa Inch, MD  [facility] Providers Cardiologist:  None Electrophysiologist:  Vanessa Jorja Loa, MD {  History of Present Illness: .   [name] is a 77 y.o. female w/PMHx of breast (right then left)and bladder cancer, fibromyalgia, hypothyroidism, obesity, nonobstructive coronary artery disease, PVCs. She had a normal Myoview in 2020, diastolic CHF, PVCs  PVC burden 30% >> 8.4% on mexiletine  She saw Dr. Elberta Moreno 08/18/22, generally feeling well, reporting exertional SOB, none at rest.  Hx of multiple L knee surgeries with chronic LLE swelling.  Not felt to be clearly volume OL, planned for labs No changes made.   Today's visit is scheduled as a 6 mo visit  ROS:   Largely sounds to be perhaps at about her baseline She reports fairly significant DOE No rest SOB, has slept with 3 pillows for 15 years But her DOE seems to be steadily getting worse perhaps in the lasy year or more.  She has significant bait instability 2/2 her terrible L knee stability and L foot orthopedic issues, foot if fixed with no ROM, and very crooked, ofte stumbles, has fallen a number of times, mostly resulting in shoulder injuries trying to stop the fall. She gets emotional about this, has really been life changing  No CP, intermittent palpitations  that wax/wane in frequency/burden No near syncope or syncope She does get lightheaded when standing up from being bent over (ie: getting something out of the bottom cupboard)    Arrhythmia/AAD hx Mexiletine started may 2021  Studies Reviewed: Marland Kitchen    EKG done today and reviewed by myself:  SR 68bpm, no PVCs   01/13/22: LE venous US Summary:  RIGHT:  - No evidence of common femoral vein obstruction.    LEFT:  - There is no evidence of deep vein thrombosis in the lower extremity.  However, portions of this examination were  limited- see technologist  comments above.    - No cystic structure found in the popliteal fossa.    03/19/21: TTE 1. Left ventricular ejection fraction, by estimation, is 55 to 60%. The  left ventricle has normal function. The left ventricle has no regional  wall motion abnormalities. There is mild left ventricular hypertrophy.  Left ventricular diastolic parameters  are consistent with Grade I diastolic dysfunction (impaired relaxation).   2. Right ventricular systolic function is normal. The right ventricular  size is normal. There is normal pulmonary artery systolic pressure.   3. Left atrial size was mildly dilated.   4. Right atrial size was mildly dilated.   5. The mitral valve is normal in structure. No evidence of mitral valve  regurgitation.   6. The aortic valve is normal in structure. Aortic valve regurgitation is  not visualized. No aortic stenosis is present.   7. No subcostal window. IVC not well visualized.   Conclusion(s)/Recommendation(s): Otherwise normal echocardiogram, with  minor abnormalities described in the report.    Risk Assessment/Calculations:    Physical Exam:   VS:  There were no vitals taken for this visit.   Wt Readings from Last 3 Encounters:  09/02/22 250 lb 1.6 oz (113.4 kg)  08/18/22 245 lb (111.1 kg)  08/05/22 252 lb 9.6 oz (114.6 kg)    GEN: Well nourished, well developed in no acute distress NECK: No JVD; No carotid bruits CARDIAC: RRR, no murmurs, rubs, gallops RESPIRATORY:  CTA b/l without rales, wheezing or rhonchi  ABDOMEN: Soft, non-tender, non-distended EXTREMITIES:  No edema; No deformity    ASSESSMENT AND PLAN: .    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  I think reasonable to update her echo  and monitor She inquires about getting labs done, Vanessa get BMET, BNP, CBC    Dispo: back in 50mo, sooner if needed, pending above results  Signed, Vanessa Pigeon, PA-C

## 2023-02-17 NOTE — Progress Notes (Unsigned)
Enrolled patient for a 3 day Zio XT monitor to be mailed to patients home   [place] to read

## 2023-02-18 LAB — CBC
Hematocrit: 40.1 % (ref 34.0–46.6)
Hemoglobin: 12.7 g/dL (ref 11.1–15.9)
MCH: 28.7 pg (ref 26.6–33.0)
MCHC: 31.7 g/dL (ref 31.5–35.7)
MCV: 91 fL (ref 79–97)
Platelets: 140 10*3/uL — ABNORMAL LOW (ref 150–450)
RBC: 4.42 x10E6/uL (ref 3.77–5.28)
RDW: 13.1 % (ref 11.7–15.4)
WBC: 5.1 10*3/uL (ref 3.4–10.8)

## 2023-02-18 LAB — BASIC METABOLIC PANEL
BUN/Creatinine Ratio: 18 (ref 12–28)
BUN: 13 mg/dL (ref 8–27)
CO2: 24 mmol/L (ref 20–29)
Calcium: 9.6 mg/dL (ref 8.7–10.3)
Chloride: 102 mmol/L (ref 96–106)
Creatinine, Ser: 0.71 mg/dL (ref 0.57–1.00)
Glucose: 110 mg/dL — ABNORMAL HIGH (ref 70–99)
Potassium: 4.3 mmol/L (ref 3.5–5.2)
Sodium: 142 mmol/L (ref 134–144)
eGFR: 88 mL/min/{1.73_m2} (ref >59)

## 2023-02-18 LAB — PRO B NATRIURETIC PEPTIDE: NT-Pro BNP: 191 pg/mL (ref 0–738)

## 2023-02-24 ENCOUNTER — Other Ambulatory Visit: Payer: Self-pay | Admitting: Cardiology

## 2023-02-25 ENCOUNTER — Other Ambulatory Visit: Payer: Self-pay | Admitting: Cardiology

## 2023-03-05 ENCOUNTER — Ambulatory Visit (HOSPITAL_COMMUNITY): Payer: Medicare HMO

## 2023-03-19 ENCOUNTER — Ambulatory Visit (HOSPITAL_COMMUNITY): Payer: Medicare HMO | Attending: Cardiology

## 2023-03-19 DIAGNOSIS — R0602 Shortness of breath: Secondary | ICD-10-CM | POA: Diagnosis present

## 2023-03-19 DIAGNOSIS — I493 Ventricular premature depolarization: Secondary | ICD-10-CM | POA: Insufficient documentation

## 2023-03-19 LAB — ECHOCARDIOGRAM COMPLETE
Area-P 1/2: 3.06 cm2
S' Lateral: 2 cm

## 2023-04-15 ENCOUNTER — Ambulatory Visit: Payer: Medicare HMO | Admitting: Orthopedic Surgery

## 2023-04-15 ENCOUNTER — Other Ambulatory Visit: Payer: Self-pay

## 2023-04-15 DIAGNOSIS — M1811 Unilateral primary osteoarthritis of first carpometacarpal joint, right hand: Secondary | ICD-10-CM

## 2023-04-15 DIAGNOSIS — M1812 Unilateral primary osteoarthritis of first carpometacarpal joint, left hand: Secondary | ICD-10-CM

## 2023-04-17 ENCOUNTER — Encounter: Payer: Self-pay | Admitting: Orthopedic Surgery

## 2023-04-17 NOTE — Progress Notes (Signed)
Office Visit Note   Patient: [name]           Date of Birth: 12-07-1945           MRN: [id number] Visit Date: 04/15/2023 Requested by: Dani Gobble, PA-C [address] [place],  [place] [address] PCP: Dani Gobble, PA-C  Subjective: Chief Complaint  Patient presents with   Other     Bilateral hand/thumb pain    HPI: [name] is a 77 y.o. female who presents to the office reporting bilateral hand and thumb pain right worse than left.  She has had prior CMC injections in April and August of this year.  Patient reports decreased grip strength..                ROS: All systems reviewed are negative as they relate to the chief complaint within the history of present illness.  Patient denies fevers or chills.  Assessment & Plan: Visit Diagnoses:  1. Arthritis of carpometacarpal (CMC) joint of right thumb   2. Arthritis of carpometacarpal (CMC) joint of left thumb     Plan: Impression is bilateral CMC arthritis right worse than left.  Injections have been helping.  Surgery is also a consideration.  For now we will keep on with injections not more than 3/year.  She does have significant osteophytes present in that [facility] joint.  Follow-up as needed.  Follow-Up Instructions: No follow-ups on file.   Orders:  Orders Placed This Encounter  Procedures   US Guided Needle Placement - No Linked Charges   No orders of the defined types were placed in this encounter.     Procedures: Small Joint Inj: R thumb CMC on 04/15/2023 5:08 PM Indications: pain Details: 25 G needle, ultrasound-guided radial approach  Spinal Needle: No  Medications: 3 mL lidocaine 1 %; 0.33 mL bupivacaine 0.25 %; 13.33 mg methylPREDNISolone acetate 40 MG/ML Outcome: tolerated well, no immediate complications Procedure, treatment alternatives, risks and benefits explained, specific risks discussed. Consent was given by the patient. Immediately prior to procedure a time  out was called to verify the correct patient, procedure, equipment, support staff and site/side marked as required. Patient was prepped and draped in the usual sterile fashion.    Small Joint Inj: L thumb CMC on 04/15/2023 5:08 PM Indications: pain Details: 25 G needle, ultrasound-guided radial approach  Spinal Needle: No  Medications: 3 mL lidocaine 1 %; 0.33 mL bupivacaine 0.25 %; 13.33 mg methylPREDNISolone acetate 40 MG/ML Outcome: tolerated well, no immediate complications Procedure, treatment alternatives, risks and benefits explained, specific risks discussed. Consent was given by the patient. Immediately prior to procedure a time out was called to verify the correct patient, procedure, equipment, support staff and site/side marked as required. Patient was prepped and draped in the usual sterile fashion.       Clinical Data: No additional findings.  Objective: Vital Signs: There were no vitals taken for this visit.  Physical Exam:  Constitutional: Patient appears well-developed HEENT:  Head: Normocephalic Eyes:EOM are normal Neck: Normal range of motion Cardiovascular: Normal rate Pulmonary/chest: Effort normal Neurologic: Patient is alert Skin: Skin is warm Psychiatric: Patient has normal mood and affect  Ortho Exam: Ortho exam demonstrates intact EPL FPL interosseous function.  Positive grind test right more than left.  No snuffbox tenderness.  No tenderness over the first dorsal compartment on either wrist.  Grip strength is mildly diminished.  Wrist range of motion intact.  MCP collateral ligaments  are stable in both thumbs.  Specialty Comments:  No specialty comments available.  Imaging: No results found.   PMFS History: Patient Active Problem List   Diagnosis Date Noted   Personal history of breast cancer 07/16/2020   Family history of brain cancer 07/16/2020   Family history of kidney cancer 07/16/2020   Genetic testing 05/23/2020   S/P reverse total  shoulder arthroplasty, left 10/25/2019   History of adenomatous polyp of colon 09/30/2018   Shortness of breath    ACS (acute coronary syndrome) (HCC) 04/11/2017   Chest pain, rule out acute myocardial infarction 04/10/2017   Bigeminy 04/10/2017   Chronic cystitis 04/09/2017   Thrombocytopenic disorder (HCC) 02/25/2017   Osteoporosis 12/15/2016   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   Incomplete emptying of bladder 07/30/2016   Left foot pain 12/11/2015   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   Stopped smoking with greater than 40 pack year history 10/23/2015   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   Mixed incontinence urge and stress 09/16/2011   Cancer of right breast, stage 2 (HCC) 05/28/2011   DCIS (ductal carcinoma in situ) of breast 05/28/2011   Hypothyroid 05/28/2011   GERD (gastroesophageal reflux disease) 05/28/2011   Fibromyalgia 05/28/2011   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   Mild depression 05/28/2011   Thrombocytopenia, unspecified (HCC) 05/28/2011   Morbid obesity (HCC) 04/10/2011   GERD 11/09/2009   ALLERGIC RHINITIS 08/23/2007   ASTHMA 08/23/2007   COUGH 08/23/2007   SKIN CANCER, HX OF 08/23/2007   Past Medical History:  Diagnosis Date   ACS (acute coronary syndrome) (HCC) 04/11/2017   ALLERGIC RHINITIS 08/23/2007   Qualifier: Diagnosis of  By: Thad Ranger LPN, Megan     Anxiety    Arthritis    Asthma    ASTHMA 08/23/2007   Qualifier: Diagnosis of  By: Thad Ranger LPN, Megan     Bigeminy 04/10/2017   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   Cancer of right breast, stage 2 (HCC) 05/28/2011   2.8 cm invasive 1 node pos ER pos S/P mastectomy 06/1995 then AC x 4 then Tam X 5    Chest pain, rule out acute myocardial infarction 04/10/2017   Chronic constipation    Chronic cystitis 04/09/2017   Cough 08/23/2007   Qualifier: Diagnosis of  By: Shelle Iron MD, Maree Krabbe    Depression    DJD (degenerative  joint disease) of lumbar spine 05/28/2011   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   Dysrhythmia 2020   Had Afib   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   Fibromyalgia    GERD (gastroesophageal reflux disease)    History of adenomatous polyp of colon 09/30/2018   History of breast cancer no recurrence   1997  right breast cancer  s/p  mastectomy w/ snl dissection and chemoradiation/  2005  left breast cancer DCIS  s/p  lumpectomy and radiation   History of colon polyps    2007 hyperplagia   History of esophageal dilatation    2008   History of peptic ulcer    History of small bowel obstruction    2012   Hypothyroid 05/28/2011   Hypothyroidism    Incomplete emptying of bladder 07/30/2016   Left foot pain 12/11/2015   Overview:  Dr Candis Shine [facility]   Macular degeneration of right eye    Mild depression 05/28/2011   Mixed incontinence urge and stress 09/16/2011   S/p lumax desires surgery  Morbid obesity (HCC) 04/10/2011   Numbness of left foot    4 toes   Osteoporosis 12/15/2016   Overview:  Dr Estanislado Pandy   Paget's disease of vulva [facility])    Personal history of chemotherapy 1997   Personal history of radiation therapy 2005   PONV (postoperative nausea and vomiting)    and HARD TO WAKE   Seasonal allergies    Shortness of breath    SKIN CANCER, HX OF 08/23/2007   Qualifier: Diagnosis of  By: Thad Ranger LPN, Megan     SUI (stress urinary incontinence, female)    Thrombocytopenic disorder (HCC) 02/25/2017   Wears glasses     Family History  Problem Relation Age of Onset   Kidney cancer Father 8       metastatic    Alzheimer's disease Mother    Other Mother        ? Cancer   Heart attack Maternal Grandmother    Alcohol abuse Brother    Bladder Cancer Brother        dx 56s   Breast cancer Paternal Aunt        dx after 15   Bladder Cancer Paternal Aunt        dx after 18   Breast cancer Cousin        two paternal female cousins; unknown age of diagnosis   Cancer  Maternal Aunt        ? gallbladder; dx 18s   Cancer Maternal Uncle        unknown type; ? pancreatic ? colon; dx after 69   Brain cancer Paternal Uncle        unknown age of dx   Melanoma Brother        dx late 50s-early 48s   Bone cancer Maternal Uncle        dx after 71   Leukemia Maternal Uncle        dx after 50   Bone cancer Maternal Uncle        dx after 50   Lung cancer Maternal Uncle        dx after 58   Brain cancer Cousin        two maternal female cousins; dx after 48   Stomach cancer Paternal Aunt        unknown age of dx    Past Surgical History:  Procedure Laterality Date   BLADDER SUSPENSION  10/13/2011   Procedure: TRANSVAGINAL TAPE (TVT) PROCEDURE;  Surgeon: Purcell Nails, MD;  Location: [facility];  Service: Gynecology;  Laterality: N/A;   BREAST BIOPSY Left 03/19/2004   BREAST LUMPECTOMY Left 03-27-2004   CARDIAC CATHETERIZATION  10-30-2000  dr Donnie Aho   normal coronary arteries and LVF/  ef 65-70%   CLOSED MANIPULATION  W/ OPEN REDUCTION EXCHANGE TIBIAL FEMORAL BEARING POST TOTAL LEFT KNEE  04-14-2001   CYSTOSCOPY  10/13/2011   Procedure: CYSTOSCOPY;  Surgeon: Purcell Nails, MD;  Location: [facility];  Service: Gynecology;  Laterality: Bilateral;   DIAGNOSTIC LAPAROSCOPY     Colon d/t SBO   DILATION AND CURETTAGE OF UTERUS  1968   FOOT SURGERY Left 02-01-2009   TRIPLE ARTHRODESIS   FOOT SURGERY Left 11/14   I&D left foot with woundvac placement   INCISIONAL HERNIA REPAIR  05/05/2011   Procedure: LAPAROSCOPIC INCISIONAL HERNIA;  Surgeon: Mariella Saa, MD;  Location: [facility];  Service: General;  Laterality: N/A;  LAPAROSCOPIC REPAIR INCARCERATED INCISIONAL HERNIA  WITH MESH  KNEE ARTHROSCOPY Bilateral left 01-1984 & 04-1992/   right [id number]   LAPAROSCOPIC CHOLECYSTECTOMY  06-21-2010   LEFT HEART CATH AND CORONARY ANGIOGRAPHY N/A 04/13/2017   Procedure: LEFT HEART CATH AND CORONARY ANGIOGRAPHY;  Surgeon: Corky Crafts, MD;  Location: [facility] INVASIVE CV  LAB;  Service: Cardiovascular;  Laterality: N/A;   LUMBAR SPINE SURGERY  1998   L5 -- S1   MASTECTOMY Right 1997   W/  LYMPH NODE DISSECTION AND RECONSTRUCTION WITH IMPLANTS AND EXPANDER   NEGATIVE SLEEP STUDY  12/26/2015   PORT-A-CATH PLACEMENT AND REMOVAL  1997   REMOVAL RIGHT BREAST IMPLANT AND CAPSULECTOMY  06-03-1999   REVERSE SHOULDER ARTHROPLASTY Left 10/25/2019   Procedure: LEFT REVERSE SHOULDER ARTHROPLASTY;  Surgeon: Cammy Copa, MD;  Location: [facility];  Service: Orthopedics;  Laterality: Left;   REVISION TOTAL KNEE ARTHROPLASTY Left 07-07-2005  &  12-10-2001   12-10-2001  REMOVAL TOTAL KNEE/ I&D / INSERTION ANTIBIOTIC SPACER   REVISION TOTAL KNEE ARTHROPLASTY Right 07-07-2005   ROTATOR CUFF REPAIR Right    TENDON REPAIR RIGHT RING FINGER  2011   THUMB TRIGGER RELEASE Right 1993   THYROIDECTOMY, PARTIAL  1983   TONSILLECTOMY  1968   TOTAL KNEE ARTHROPLASTY Bilateral right 05-04-2000/  left 03-29-2001   VULVECTOMY  05/27/2012   Procedure: WIDE EXCISION VULVECTOMY;  Surgeon: Rejeana Brock A. Duard Brady, MD;  Location: [facility];  Service: Gynecology;  Laterality: N/A;  Wide Local Excision of Vulva    VULVECTOMY N/A 11/23/2012   Procedure: WIDE LOCAL EXCISION OF THE VULVA;  Surgeon: Rejeana Brock A. Duard Brady, MD;  Location: [facility];  Service: Gynecology;  Laterality: N/A;  left inferior vulvar biopsy excision left superior lesion left inferior vulvar excision   VULVECTOMY N/A 08/02/2013   Procedure: WIDE LOCAL  EXCISION VULVA;  Surgeon: Rejeana Brock A. Duard Brady, MD;  Location: [facility];  Service: Gynecology;  Laterality: N/A;   VULVECTOMY Left 03/29/2014   Procedure: WIDE LOCAL EXCISION OF LEFT VULVA ;  Surgeon: Rejeana Brock A. Duard Brady, MD;  Location: [facility];  Service: Gynecology;  Laterality: Left;   VULVECTOMY Bilateral 01/24/2015   Procedure: WIDE LOCAL EXCISION VULVA;  Surgeon: Cleda Mccreedy, MD;  Location: [facility];  Service: Gynecology;  Laterality: Bilateral;   VULVECTOMY PARTIAL   07-16-2010   Social History   Occupational History   Not on file  Tobacco Use   Smoking status: Former    Current packs/day: 0.00    Average packs/day: 2.0 packs/day for 41.0 years (82.0 ttl pk-yrs)    Types: Cigarettes    Start date: 06/04/1958    Quit date: 06/05/1999    Years since quitting: 23.8   Smokeless tobacco: Never  Vaping Use   Vaping status: Never Used  Substance and Sexual Activity   Alcohol use: No   Drug use: No   Sexual activity: Not Currently    Birth control/protection: Post-menopausal

## 2023-04-19 MED ORDER — METHYLPREDNISOLONE ACETATE 40 MG/ML IJ SUSP
13.3300 mg | INTRAMUSCULAR | Status: AC | PRN
  Administered 2023-04-15: 13.33 mg via INTRA_ARTICULAR

## 2023-04-19 MED ORDER — LIDOCAINE HCL 1 % IJ SOLN
3.0000 mL | INTRAMUSCULAR | Status: AC | PRN
  Administered 2023-04-15: 3 mL

## 2023-04-19 MED ORDER — BUPIVACAINE HCL 0.25 % IJ SOLN
0.3300 mL | INTRAMUSCULAR | Status: AC | PRN
  Administered 2023-04-15: .33 mL via INTRA_ARTICULAR

## 2023-06-30 NOTE — Progress Notes (Signed)
.  POV

## 2023-07-15 ENCOUNTER — Ambulatory Visit: Payer: Medicare HMO | Admitting: Surgical

## 2023-07-30 ENCOUNTER — Ambulatory Visit: Payer: Medicare HMO | Admitting: Orthopedic Surgery

## 2023-07-30 ENCOUNTER — Encounter: Payer: Self-pay | Admitting: Orthopedic Surgery

## 2023-07-30 DIAGNOSIS — M25572 Pain in left ankle and joints of left foot: Secondary | ICD-10-CM | POA: Diagnosis not present

## 2023-07-30 NOTE — Progress Notes (Signed)
 Office Visit Note   Patient: [name]           Date of Birth: 01/19/1946           MRN: [id number] Visit Date: 07/30/2023 Requested by: Dani Gobble, PA-C [address] [place],  [place] [address] PCP: Dani Gobble, PA-C  Subjective: No chief complaint on file.   HPI: [name] is a 78 y.o. female who presents to the office reporting right heel pain of 3 weeks duration.  Patient ambulating with a cane.  Has had multiple surgeries on both lower extremities.  Right leg is generally her good leg.  Radiographs reviewed and the patient has some calcification in the plantar aspect of the foot.  Difficult for her to weight-bear.  Describes swelling and throbbing.  She has to slide her foot when she walks.  Elevates the foot at night.  She was actually okay walking 1 month ago.  She does take vitamin D3..                ROS: All systems reviewed are negative as they relate to the chief complaint within the history of present illness.  Patient denies fevers or chills.  Assessment & Plan: Visit Diagnoses:  1. Pain in left ankle and joints of left foot     Plan: Impression is right heel pain unclear etiology.  Could be stress reaction, stress fracture.  Not really classic for plantar fasciitis.  Does have functional eversion and inversion as well as plantarflexion.  Plan MRI right ankle to evaluate the calcaneus.  Follow-up after that study.  Follow-Up Instructions: No follow-ups on file.   Orders:  No orders of the defined types were placed in this encounter.  No orders of the defined types were placed in this encounter.     Procedures: No procedures performed   Clinical Data: No additional findings.  Objective: Vital Signs: There were no vitals taken for this visit.  Physical Exam:  Constitutional: Patient appears well-developed HEENT:  Head: Normocephalic Eyes:EOM are normal Neck: Normal range of motion Cardiovascular: Normal  rate Pulmonary/chest: Effort normal Neurologic: Patient is alert Skin: Skin is warm Psychiatric: Patient has normal mood and affect  Ortho Exam: Ortho exam demonstrates antalgic gait to the right pedal pulses palpable prior incision along the first ray of the right foot noted.  Ankle dorsiflexion plantarflexion intact.  Palpable intact nontender anterior to posterior to peroneal and Achilles tendons.  Does have some tenderness both on the medial and lateral side of the calcaneus as well as on the plantar aspect.  No masses lymphadenopathy or skin changes noted in that right ankle region  Specialty Comments:  No specialty comments available.  Imaging: No results found.   PMFS History: Patient Active Problem List   Diagnosis Date Noted   Personal history of breast cancer 07/16/2020   Family history of brain cancer 07/16/2020   Family history of kidney cancer 07/16/2020   Genetic testing 05/23/2020   S/P reverse total shoulder arthroplasty, left 10/25/2019   History of adenomatous polyp of colon 09/30/2018   Shortness of breath    ACS (acute coronary syndrome) (HCC) 04/11/2017   Chest pain, rule out acute myocardial infarction 04/10/2017   Bigeminy 04/10/2017   Chronic cystitis 04/09/2017   Thrombocytopenic disorder (HCC) 02/25/2017   Osteoporosis 12/15/2016   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   Incomplete emptying of bladder 07/30/2016   Left foot pain 12/11/2015  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   Stopped smoking with greater than 40 pack year history 10/23/2015   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   Mixed incontinence urge and stress 09/16/2011   Cancer of right breast, stage 2 (HCC) 05/28/2011   DCIS (ductal carcinoma in situ) of breast 05/28/2011   Hypothyroid 05/28/2011   GERD (gastroesophageal reflux disease) 05/28/2011   Fibromyalgia 05/28/2011   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   Mild depression  05/28/2011   Thrombocytopenia, unspecified (HCC) 05/28/2011   Morbid obesity (HCC) 04/10/2011   GERD 11/09/2009   ALLERGIC RHINITIS 08/23/2007   ASTHMA 08/23/2007   COUGH 08/23/2007   SKIN CANCER, HX OF 08/23/2007   Past Medical History:  Diagnosis Date   ACS (acute coronary syndrome) (HCC) 04/11/2017   ALLERGIC RHINITIS 08/23/2007   Qualifier: Diagnosis of  By: Thad Ranger LPN, Megan     Anxiety    Arthritis    Asthma    ASTHMA 08/23/2007   Qualifier: Diagnosis of  By: Thad Ranger LPN, Megan     Bigeminy 04/10/2017   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   Cancer of right breast, stage 2 (HCC) 05/28/2011   2.8 cm invasive 1 node pos ER pos S/P mastectomy 06/1995 then AC x 4 then Tam X 5    Chest pain, rule out acute myocardial infarction 04/10/2017   Chronic constipation    Chronic cystitis 04/09/2017   Cough 08/23/2007   Qualifier: Diagnosis of  By: Shelle Iron MD, Maree Krabbe    Depression    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   Dysrhythmia 2020   Had Afib   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   Fibromyalgia    GERD (gastroesophageal reflux disease)    History of adenomatous polyp of colon 09/30/2018   History of breast cancer no recurrence   1997  right breast cancer  s/p  mastectomy w/ snl dissection and chemoradiation/  2005  left breast cancer DCIS  s/p  lumpectomy and radiation   History of colon polyps    2007 hyperplagia   History of esophageal dilatation    2008   History of peptic ulcer    History of small bowel obstruction    2012   Hypothyroid 05/28/2011   Hypothyroidism    Incomplete emptying of bladder 07/30/2016   Left foot pain 12/11/2015   Overview:  Dr Candis Shine [facility]   Macular degeneration of right eye    Mild depression 05/28/2011   Mixed incontinence urge and stress 09/16/2011   S/p lumax desires surgery    Morbid obesity (HCC) 04/10/2011   Numbness of left foot     4 toes   Osteoporosis 12/15/2016   Overview:  Dr Estanislado Pandy   Paget's disease of vulva [facility])    Personal history of chemotherapy 1997   Personal history of radiation therapy 2005   PONV (postoperative nausea and vomiting)    and HARD TO WAKE   Seasonal allergies    Shortness of breath    SKIN CANCER, HX OF 08/23/2007   Qualifier: Diagnosis of  By: Thad Ranger LPN, Megan     SUI (stress urinary incontinence, female)    Thrombocytopenic disorder (HCC) 02/25/2017   Wears glasses     Family History  Problem Relation Age of Onset   Kidney cancer Father 27       metastatic    Alzheimer's disease Mother    Other  Mother        ? Cancer   Heart attack Maternal Grandmother    Alcohol abuse Brother    Bladder Cancer Brother        dx 80s   Breast cancer Paternal Aunt        dx after 39   Bladder Cancer Paternal Aunt        dx after 3   Breast cancer Cousin        two paternal female cousins; unknown age of diagnosis   Cancer Maternal Aunt        ? gallbladder; dx 80s   Cancer Maternal Uncle        unknown type; ? pancreatic ? colon; dx after 73   Brain cancer Paternal Uncle        unknown age of dx   Melanoma Brother        dx late 50s-early 16s   Bone cancer Maternal Uncle        dx after 55   Leukemia Maternal Uncle        dx after 50   Bone cancer Maternal Uncle        dx after 50   Lung cancer Maternal Uncle        dx after 46   Brain cancer Cousin        two maternal female cousins; dx after 65   Stomach cancer Paternal Aunt        unknown age of dx    Past Surgical History:  Procedure Laterality Date   BLADDER SUSPENSION  10/13/2011   Procedure: TRANSVAGINAL TAPE (TVT) PROCEDURE;  Surgeon: Purcell Nails, MD;  Location: [facility];  Service: Gynecology;  Laterality: N/A;   BREAST BIOPSY Left 03/19/2004   BREAST LUMPECTOMY Left 03-27-2004   CARDIAC CATHETERIZATION  10-30-2000  dr Donnie Aho   normal coronary arteries and LVF/  ef 65-70%   CLOSED MANIPULATION  W/ OPEN  REDUCTION EXCHANGE TIBIAL FEMORAL BEARING POST TOTAL LEFT KNEE  04-14-2001   CYSTOSCOPY  10/13/2011   Procedure: CYSTOSCOPY;  Surgeon: Purcell Nails, MD;  Location: [facility];  Service: Gynecology;  Laterality: Bilateral;   DIAGNOSTIC LAPAROSCOPY     Colon d/t SBO   DILATION AND CURETTAGE OF UTERUS  1968   FOOT SURGERY Left 02-01-2009   TRIPLE ARTHRODESIS   FOOT SURGERY Left 11/14   I&D left foot with woundvac placement   INCISIONAL HERNIA REPAIR  05/05/2011   Procedure: LAPAROSCOPIC INCISIONAL HERNIA;  Surgeon: Mariella Saa, MD;  Location: [facility];  Service: General;  Laterality: N/A;  LAPAROSCOPIC REPAIR INCARCERATED INCISIONAL HERNIA  WITH MESH   KNEE ARTHROSCOPY Bilateral left 01-1984 & 04-1992/   right 09-1991   LAPAROSCOPIC CHOLECYSTECTOMY  06-21-2010   LEFT HEART CATH AND CORONARY ANGIOGRAPHY N/A 04/13/2017   Procedure: LEFT HEART CATH AND CORONARY ANGIOGRAPHY;  Surgeon: Corky Crafts, MD;  Location: [facility] INVASIVE CV LAB;  Service: Cardiovascular;  Laterality: N/A;   LUMBAR SPINE SURGERY  1998   L5 -- S1   MASTECTOMY Right 1997   W/  LYMPH NODE DISSECTION AND RECONSTRUCTION WITH IMPLANTS AND EXPANDER   NEGATIVE SLEEP STUDY  12/26/2015   PORT-A-CATH PLACEMENT AND REMOVAL  1997   REMOVAL RIGHT BREAST IMPLANT AND CAPSULECTOMY  06-03-1999   REVERSE SHOULDER ARTHROPLASTY Left 10/25/2019   Procedure: LEFT REVERSE SHOULDER ARTHROPLASTY;  Surgeon: Cammy Copa, MD;  Location: [facility];  Service: Orthopedics;  Laterality: Left;   REVISION TOTAL KNEE ARTHROPLASTY  Left 07-07-2005  &  12-10-2001   12-10-2001  REMOVAL TOTAL KNEE/ I&D / INSERTION ANTIBIOTIC SPACER   REVISION TOTAL KNEE ARTHROPLASTY Right 07-07-2005   ROTATOR CUFF REPAIR Right    TENDON REPAIR RIGHT RING FINGER  2011   THUMB TRIGGER RELEASE Right 1993   THYROIDECTOMY, PARTIAL  1983   TONSILLECTOMY  1968   TOTAL KNEE ARTHROPLASTY Bilateral right 05-04-2000/  left 03-29-2001   VULVECTOMY  05/27/2012   Procedure:  WIDE EXCISION VULVECTOMY;  Surgeon: Rejeana Brock A. Duard Brady, MD;  Location: [facility];  Service: Gynecology;  Laterality: N/A;  Wide Local Excision of Vulva    VULVECTOMY N/A 11/23/2012   Procedure: WIDE LOCAL EXCISION OF THE VULVA;  Surgeon: Rejeana Brock A. Duard Brady, MD;  Location: [facility];  Service: Gynecology;  Laterality: N/A;  left inferior vulvar biopsy excision left superior lesion left inferior vulvar excision   VULVECTOMY N/A 08/02/2013   Procedure: WIDE LOCAL  EXCISION VULVA;  Surgeon: Rejeana Brock A. Duard Brady, MD;  Location: [facility];  Service: Gynecology;  Laterality: N/A;   VULVECTOMY Left 03/29/2014   Procedure: WIDE LOCAL EXCISION OF LEFT VULVA ;  Surgeon: Rejeana Brock A. Duard Brady, MD;  Location: [facility];  Service: Gynecology;  Laterality: Left;   VULVECTOMY Bilateral 01/24/2015   Procedure: WIDE LOCAL EXCISION VULVA;  Surgeon: Cleda Mccreedy, MD;  Location: [facility];  Service: Gynecology;  Laterality: Bilateral;   VULVECTOMY PARTIAL  07-16-2010   Social History   Occupational History   Not on file  Tobacco Use   Smoking status: Former    Current packs/day: 0.00    Average packs/day: 2.0 packs/day for 41.0 years (82.0 ttl pk-yrs)    Types: Cigarettes    Start date: 06/04/1958    Quit date: 06/05/1999    Years since quitting: 24.1   Smokeless tobacco: Never  Vaping Use   Vaping status: Never Used  Substance and Sexual Activity   Alcohol use: No   Drug use: No   Sexual activity: Not Currently    Birth control/protection: Post-menopausal

## 2023-07-31 ENCOUNTER — Encounter: Payer: Self-pay | Admitting: Orthopedic Surgery

## 2023-08-10 ENCOUNTER — Encounter: Payer: Self-pay | Admitting: Orthopedic Surgery

## 2023-08-12 ENCOUNTER — Ambulatory Visit (INDEPENDENT_AMBULATORY_CARE_PROVIDER_SITE_OTHER): Admitting: Orthopedic Surgery

## 2023-08-12 ENCOUNTER — Ambulatory Visit
Admission: RE | Admit: 2023-08-12 | Discharge: 2023-08-12 | Disposition: A | Discharge status: Home / Self Care | Source: Ambulatory Visit | Attending: Orthopedic Surgery | Admitting: Orthopedic Surgery

## 2023-08-12 ENCOUNTER — Ambulatory Visit: Payer: Self-pay

## 2023-08-12 DIAGNOSIS — M1812 Unilateral primary osteoarthritis of first carpometacarpal joint, left hand: Secondary | ICD-10-CM | POA: Diagnosis not present

## 2023-08-12 DIAGNOSIS — M25572 Pain in left ankle and joints of left foot: Secondary | ICD-10-CM

## 2023-08-12 DIAGNOSIS — M1811 Unilateral primary osteoarthritis of first carpometacarpal joint, right hand: Secondary | ICD-10-CM | POA: Diagnosis not present

## 2023-08-12 MED ORDER — HYDROCODONE-ACETAMINOPHEN 5-325 MG PO TABS: ORAL_TABLET | ORAL | 0 refills | Status: DC

## 2023-08-13 ENCOUNTER — Encounter: Payer: Self-pay | Admitting: Orthopedic Surgery

## 2023-08-13 NOTE — Progress Notes (Unsigned)
 Office Visit Note   Patient: [name]           Date of Birth: 31-Aug-1945           MRN: [id number] Visit Date: 08/12/2023 Requested by: Dani Gobble, PA-C [address] [place],  [place] [id number] PCP: Dani Gobble, PA-C  Subjective: Chief Complaint  Patient presents with   Other    Bil thumb pain    HPI: [name] is a 78 y.o. female who presents to the office reporting bilateral hand and wrist pain.  She has a known history of bilateral CMC arthritis.  Last injections about 4 months ago.  They did help.  Patient describes decreased pinch strength.  Does not report much in the way of radicular type symptoms..                ROS: All systems reviewed are negative as they relate to the chief complaint within the history of present illness.  Patient denies fevers or chills.  Assessment & Plan: Visit Diagnoses:  1. Arthritis of carpometacarpal (CMC) joint of right thumb   2. Arthritis of carpometacarpal (CMC) joint of left thumb     Plan: Impression is bilateral CMC arthritis.  CMC injections are performed today.  She really has very end-stage arthritis in that [facility] joint.  Anticipate these injections may help her less and less as time goes by.  One-time prescription for Norco written.  She will follow-up with [place] as needed.  Follow-Up Instructions: No follow-ups on file.   Orders:  Orders Placed This Encounter  Procedures   US Guided Needle Placement - No Linked Charges   Meds ordered this encounter  Medications   HYDROcodone-acetaminophen (NORCO/VICODIN) 5-325 MG tablet    Sig: 1 po q 8 hrs prn pain    Dispense:  25 tablet    Refill:  0      Procedures: Small Joint Inj: R thumb MCP on 08/12/2023 8:38 AM Indications: pain Details: 25 G needle, ultrasound-guided radial approach  Spinal Needle: No  Medications: 3 mL lidocaine 1 %; 0.33 mL bupivacaine 0.25 %; 13.33 mg methylPREDNISolone acetate 40 MG/ML Outcome: tolerated well,  no immediate complications Procedure, treatment alternatives, risks and benefits explained, specific risks discussed. Consent was given by the patient. Immediately prior to procedure a time out was called to verify the correct patient, procedure, equipment, support staff and site/side marked as required. Patient was prepped and draped in the usual sterile fashion.    Small Joint Inj: L thumb MCP on 08/12/2023 8:38 AM Indications: pain Details: 25 G needle, ultrasound-guided radial approach  Spinal Needle: No  Medications: 3 mL lidocaine 1 %; 0.33 mL bupivacaine 0.25 %; 13.33 mg methylPREDNISolone acetate 40 MG/ML Outcome: tolerated well, no immediate complications Procedure, treatment alternatives, risks and benefits explained, specific risks discussed. Consent was given by the patient. Immediately prior to procedure a time out was called to verify the correct patient, procedure, equipment, support staff and site/side marked as required. Patient was prepped and draped in the usual sterile fashion.       Clinical Data: No additional findings.  Objective: Vital Signs: There were no vitals taken for this visit.  Physical Exam:  Constitutional: Patient appears well-developed HEENT:  Head: Normocephalic Eyes:EOM are normal Neck: Normal range of motion Cardiovascular: Normal rate Pulmonary/chest: Effort normal Neurologic: Patient is alert Skin: Skin is warm Psychiatric: Patient has normal mood and affect  Ortho Exam: Ortho exam demonstrates EPL  FPL interosseous strength intact radial pulse intact.  Patient does have positive grind test for [facility] arthritis.  Wrist range of motion otherwise intact.  No radial styloid tenderness.  Specialty Comments:  No specialty comments available.  Imaging: No results found.   PMFS History: Patient Active Problem List   Diagnosis Date Noted   Personal history of breast cancer 07/16/2020   Family history of brain cancer 07/16/2020   Family  history of kidney cancer 07/16/2020   Genetic testing 05/23/2020   S/P reverse total shoulder arthroplasty, left 10/25/2019   History of adenomatous polyp of colon 09/30/2018   Shortness of breath    ACS (acute coronary syndrome) (HCC) 04/11/2017   Chest pain, rule out acute myocardial infarction 04/10/2017   Bigeminy 04/10/2017   Chronic cystitis 04/09/2017   Thrombocytopenic disorder (HCC) 02/25/2017   Osteoporosis 12/15/2016   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   Incomplete emptying of bladder 07/30/2016   Left foot pain 12/11/2015   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   Stopped smoking with greater than 40 pack year history 10/23/2015   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   Mixed incontinence urge and stress 09/16/2011   Cancer of right breast, stage 2 (HCC) 05/28/2011   DCIS (ductal carcinoma in situ) of breast 05/28/2011   Hypothyroid 05/28/2011   GERD (gastroesophageal reflux disease) 05/28/2011   Fibromyalgia 05/28/2011   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   Mild depression 05/28/2011   Thrombocytopenia, unspecified (HCC) 05/28/2011   Morbid obesity (HCC) 04/10/2011   GERD 11/09/2009   ALLERGIC RHINITIS 08/23/2007   ASTHMA 08/23/2007   COUGH 08/23/2007   SKIN CANCER, HX OF 08/23/2007   Past Medical History:  Diagnosis Date   ACS (acute coronary syndrome) (HCC) 04/11/2017   ALLERGIC RHINITIS 08/23/2007   Qualifier: Diagnosis of  By: Thad Ranger LPN, Megan     Anxiety    Arthritis    Asthma    ASTHMA 08/23/2007   Qualifier: Diagnosis of  By: Thad Ranger LPN, Megan     Bigeminy 04/10/2017   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   Cancer of right breast, stage 2 (HCC) 05/28/2011   2.8 cm invasive 1 node pos ER pos S/P mastectomy 06/1995 then AC x 4 then Tam X 5    Chest pain, rule out acute myocardial infarction 04/10/2017   Chronic constipation    Chronic cystitis 04/09/2017   Cough 08/23/2007    Qualifier: Diagnosis of  By: Shelle Iron MD, Maree Krabbe    Depression    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   Dysrhythmia 2020   Had Afib   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   Fibromyalgia    GERD (gastroesophageal reflux disease)    History of adenomatous polyp of colon 09/30/2018   History of breast cancer no recurrence   1997  right breast cancer  s/p  mastectomy w/ snl dissection and chemoradiation/  2005  left breast cancer DCIS  s/p  lumpectomy and radiation   History of colon polyps    2007 hyperplagia   History of esophageal dilatation    2008   History of peptic ulcer    History of small bowel obstruction    2012   Hypothyroid 05/28/2011   Hypothyroidism    Incomplete emptying of bladder 07/30/2016   Left foot pain 12/11/2015   Overview:  Dr Candis Shine [facility]   Macular degeneration of right  eye    Mild depression 05/28/2011   Mixed incontinence urge and stress 09/16/2011   S/p lumax desires surgery    Morbid obesity (HCC) 04/10/2011   Numbness of left foot    4 toes   Osteoporosis 12/15/2016   Overview:  Dr Estanislado Pandy   Paget's disease of vulva [facility])    Personal history of chemotherapy 1997   Personal history of radiation therapy 2005   PONV (postoperative nausea and vomiting)    and HARD TO WAKE   Seasonal allergies    Shortness of breath    SKIN CANCER, HX OF 08/23/2007   Qualifier: Diagnosis of  By: Thad Ranger LPN, Megan     SUI (stress urinary incontinence, female)    Thrombocytopenic disorder (HCC) 02/25/2017   Wears glasses     Family History  Problem Relation Age of Onset   Kidney cancer Father 50       metastatic    Alzheimer's disease Mother    Other Mother        ? Cancer   Heart attack Maternal Grandmother    Alcohol abuse Brother    Bladder Cancer Brother        dx 63s   Breast cancer Paternal Aunt        dx after 45   Bladder Cancer Paternal Aunt        dx after 40   Breast  cancer Cousin        two paternal female cousins; unknown age of diagnosis   Cancer Maternal Aunt        ? gallbladder; dx 30s   Cancer Maternal Uncle        unknown type; ? pancreatic ? colon; dx after 69   Brain cancer Paternal Uncle        unknown age of dx   Melanoma Brother        dx late 50s-early 3s   Bone cancer Maternal Uncle        dx after 59   Leukemia Maternal Uncle        dx after 50   Bone cancer Maternal Uncle        dx after 50   Lung cancer Maternal Uncle        dx after 10   Brain cancer Cousin        two maternal female cousins; dx after 30   Stomach cancer Paternal Aunt        unknown age of dx    Past Surgical History:  Procedure Laterality Date   BLADDER SUSPENSION  10/13/2011   Procedure: TRANSVAGINAL TAPE (TVT) PROCEDURE;  Surgeon: Purcell Nails, MD;  Location: [facility];  Service: Gynecology;  Laterality: N/A;   BREAST BIOPSY Left 03/19/2004   BREAST LUMPECTOMY Left 03-27-2004   CARDIAC CATHETERIZATION  10-30-2000  dr Donnie Aho   normal coronary arteries and LVF/  ef 65-70%   CLOSED MANIPULATION  W/ OPEN REDUCTION EXCHANGE TIBIAL FEMORAL BEARING POST TOTAL LEFT KNEE  04-14-2001   CYSTOSCOPY  10/13/2011   Procedure: CYSTOSCOPY;  Surgeon: Purcell Nails, MD;  Location: [facility];  Service: Gynecology;  Laterality: Bilateral;   DIAGNOSTIC LAPAROSCOPY     Colon d/t SBO   DILATION AND CURETTAGE OF UTERUS  1968   FOOT SURGERY Left 02-01-2009   TRIPLE ARTHRODESIS   FOOT SURGERY Left 11/14   I&D left foot with woundvac placement   INCISIONAL HERNIA REPAIR  05/05/2011   Procedure: LAPAROSCOPIC INCISIONAL HERNIA;  Surgeon: Sharlet Salina  Lurlean Leyden, MD;  Location: [facility];  Service: General;  Laterality: N/A;  LAPAROSCOPIC REPAIR INCARCERATED INCISIONAL HERNIA  WITH MESH   KNEE ARTHROSCOPY Bilateral left 01-1984 & 04-1992/   right [phone]   LAPAROSCOPIC CHOLECYSTECTOMY  06-21-2010   LEFT HEART CATH AND CORONARY ANGIOGRAPHY N/A 04/13/2017   Procedure: LEFT HEART CATH  AND CORONARY ANGIOGRAPHY;  Surgeon: Corky Crafts, MD;  Location: [facility] INVASIVE CV LAB;  Service: Cardiovascular;  Laterality: N/A;   LUMBAR SPINE SURGERY  1998   L5 -- S1   MASTECTOMY Right 1997   W/  LYMPH NODE DISSECTION AND RECONSTRUCTION WITH IMPLANTS AND EXPANDER   NEGATIVE SLEEP STUDY  12/26/2015   PORT-A-CATH PLACEMENT AND REMOVAL  1997   REMOVAL RIGHT BREAST IMPLANT AND CAPSULECTOMY  06-03-1999   REVERSE SHOULDER ARTHROPLASTY Left 10/25/2019   Procedure: LEFT REVERSE SHOULDER ARTHROPLASTY;  Surgeon: Cammy Copa, MD;  Location: [facility];  Service: Orthopedics;  Laterality: Left;   REVISION TOTAL KNEE ARTHROPLASTY Left 07-07-2005  &  12-10-2001   12-10-2001  REMOVAL TOTAL KNEE/ I&D / INSERTION ANTIBIOTIC SPACER   REVISION TOTAL KNEE ARTHROPLASTY Right 07-07-2005   ROTATOR CUFF REPAIR Right    TENDON REPAIR RIGHT RING FINGER  2011   THUMB TRIGGER RELEASE Right 1993   THYROIDECTOMY, PARTIAL  1983   TONSILLECTOMY  1968   TOTAL KNEE ARTHROPLASTY Bilateral right 05-04-2000/  left 03-29-2001   VULVECTOMY  05/27/2012   Procedure: WIDE EXCISION VULVECTOMY;  Surgeon: Rejeana Brock A. Duard Brady, MD;  Location: [facility];  Service: Gynecology;  Laterality: N/A;  Wide Local Excision of Vulva    VULVECTOMY N/A 11/23/2012   Procedure: WIDE LOCAL EXCISION OF THE VULVA;  Surgeon: Rejeana Brock A. Duard Brady, MD;  Location: [facility];  Service: Gynecology;  Laterality: N/A;  left inferior vulvar biopsy excision left superior lesion left inferior vulvar excision   VULVECTOMY N/A 08/02/2013   Procedure: WIDE LOCAL  EXCISION VULVA;  Surgeon: Rejeana Brock A. Duard Brady, MD;  Location: [facility];  Service: Gynecology;  Laterality: N/A;   VULVECTOMY Left 03/29/2014   Procedure: WIDE LOCAL EXCISION OF LEFT VULVA ;  Surgeon: Rejeana Brock A. Duard Brady, MD;  Location: [facility];  Service: Gynecology;  Laterality: Left;   VULVECTOMY Bilateral 01/24/2015   Procedure: WIDE LOCAL EXCISION VULVA;  Surgeon: Cleda Mccreedy, MD;  Location: [facility]  [place];  Service: Gynecology;  Laterality: Bilateral;   VULVECTOMY PARTIAL  07-16-2010   Social History   Occupational History   Not on file  Tobacco Use   Smoking status: Former    Current packs/day: 0.00    Average packs/day: 2.0 packs/day for 41.0 years (82.0 ttl pk-yrs)    Types: Cigarettes    Start date: 06/04/1958    Quit date: 06/05/1999    Years since quitting: 24.2   Smokeless tobacco: Never  Vaping Use   Vaping status: Never Used  Substance and Sexual Activity   Alcohol use: No   Drug use: No   Sexual activity: Not Currently    Birth control/protection: Post-menopausal

## 2023-08-14 ENCOUNTER — Other Ambulatory Visit: Payer: Medicare HMO

## 2023-08-14 MED ORDER — METHYLPREDNISOLONE ACETATE 40 MG/ML IJ SUSP
13.3300 mg | INTRAMUSCULAR | Status: AC | PRN
Start: 2023-08-12 — End: 2023-08-12
  Administered 2023-08-12: 13.33 mg via INTRA_ARTICULAR

## 2023-08-14 MED ORDER — LIDOCAINE HCL 1 % IJ SOLN
3.0000 mL | INTRAMUSCULAR | Status: AC | PRN
  Administered 2023-08-12: 3 mL

## 2023-08-14 MED ORDER — BUPIVACAINE HCL 0.25 % IJ SOLN
0.3300 mL | INTRAMUSCULAR | Status: AC | PRN
  Administered 2023-08-12: .33 mL via INTRA_ARTICULAR

## 2023-08-17 ENCOUNTER — Ambulatory Visit: Payer: Self-pay | Admitting: Pulmonary Disease

## 2023-08-19 ENCOUNTER — Ambulatory Visit: Payer: Medicare HMO | Admitting: Cardiology

## 2023-08-31 ENCOUNTER — Other Ambulatory Visit: Payer: Self-pay | Admitting: Orthopedic Surgery

## 2023-08-31 MED ORDER — HYDROCODONE-ACETAMINOPHEN 5-325 MG PO TABS: ORAL_TABLET | ORAL | 0 refills | Status: DC

## 2023-08-31 NOTE — Progress Notes (Signed)
 I called.  She would like to see Dr. Victorino Dike.  Please make that referral.  Also I refilled her hydrocodone x 1.  Thank you

## 2023-09-01 ENCOUNTER — Other Ambulatory Visit: Payer: Self-pay

## 2023-09-01 ENCOUNTER — Other Ambulatory Visit: Payer: Self-pay | Admitting: *Deleted

## 2023-09-01 DIAGNOSIS — D0510 Intraductal carcinoma in situ of unspecified breast: Secondary | ICD-10-CM

## 2023-09-01 DIAGNOSIS — M25579 Pain in unspecified ankle and joints of unspecified foot: Secondary | ICD-10-CM

## 2023-09-02 ENCOUNTER — Inpatient Hospital Stay: Payer: Medicare HMO | Attending: Adult Health

## 2023-09-02 ENCOUNTER — Inpatient Hospital Stay (HOSPITAL_BASED_OUTPATIENT_CLINIC_OR_DEPARTMENT_OTHER): Payer: Medicare HMO | Admitting: Adult Health

## 2023-09-02 VITALS — BP 122/59 | HR 64 | Temp 98.2°F | Resp 17 | Ht 65.0 in | Wt 249.9 lb

## 2023-09-02 DIAGNOSIS — D0510 Intraductal carcinoma in situ of unspecified breast: Secondary | ICD-10-CM

## 2023-09-02 DIAGNOSIS — Z86000 Personal history of in-situ neoplasm of breast: Secondary | ICD-10-CM

## 2023-09-02 DIAGNOSIS — D0512 Intraductal carcinoma in situ of left breast: Secondary | ICD-10-CM

## 2023-09-02 DIAGNOSIS — C50911 Malignant neoplasm of unspecified site of right female breast: Secondary | ICD-10-CM

## 2023-09-02 DIAGNOSIS — Z853 Personal history of malignant neoplasm of breast: Secondary | ICD-10-CM | POA: Diagnosis not present

## 2023-09-02 DIAGNOSIS — Z9011 Acquired absence of right breast and nipple: Secondary | ICD-10-CM | POA: Insufficient documentation

## 2023-09-02 DIAGNOSIS — Z08 Encounter for follow-up examination after completed treatment for malignant neoplasm: Secondary | ICD-10-CM

## 2023-09-02 LAB — CBC WITH DIFFERENTIAL (CANCER CENTER ONLY)
Abs Immature Granulocytes: 0.02 10*3/uL (ref 0.00–0.07)
Basophils Absolute: 0 10*3/uL (ref 0.0–0.1)
Basophils Relative: 1 %
Eosinophils Absolute: 0.1 10*3/uL (ref 0.0–0.5)
Eosinophils Relative: 2 %
HCT: 39.1 % (ref 36.0–46.0)
Hemoglobin: 12.8 g/dL (ref 12.0–15.0)
Immature Granulocytes: 0 %
Lymphocytes Relative: 23 %
Lymphs Abs: 1.2 10*3/uL (ref 0.7–4.0)
MCH: 28.6 pg (ref 26.0–34.0)
MCHC: 32.7 g/dL (ref 30.0–36.0)
MCV: 87.3 fL (ref 80.0–100.0)
Monocytes Absolute: 0.4 10*3/uL (ref 0.1–1.0)
Monocytes Relative: 7 %
Neutro Abs: 3.7 10*3/uL (ref 1.7–7.7)
Neutrophils Relative %: 67 %
Platelet Count: 127 10*3/uL — ABNORMAL LOW (ref 150–400)
RBC: 4.48 MIL/uL (ref 3.87–5.11)
RDW: 13.7 % (ref 11.5–15.5)
WBC Count: 5.4 10*3/uL (ref 4.0–10.5)
nRBC: 0 % (ref 0.0–0.2)

## 2023-09-02 LAB — CMP (CANCER CENTER ONLY)
ALT: 14 U/L (ref 0–44)
AST: 20 U/L (ref 15–41)
Albumin: 4.2 g/dL (ref 3.5–5.0)
Alkaline Phosphatase: 80 U/L (ref 38–126)
Anion gap: 5 (ref 5–15)
BUN: 13 mg/dL (ref 8–23)
CO2: 31 mmol/L (ref 22–32)
Calcium: 9.8 mg/dL (ref 8.9–10.3)
Chloride: 105 mmol/L (ref 98–111)
Creatinine: 0.67 mg/dL (ref 0.44–1.00)
GFR, Estimated: >60 mL/min (ref >60)
Glucose, Bld: 98 mg/dL (ref 70–99)
Potassium: 5.3 mmol/L — ABNORMAL HIGH (ref 3.5–5.1)
Sodium: 141 mmol/L (ref 135–145)
Total Bilirubin: 0.7 mg/dL (ref 0.0–1.2)
Total Protein: 6.6 g/dL (ref 6.5–8.1)

## 2023-09-02 NOTE — Progress Notes (Unsigned)
 [facility] Cancer Follow up:    Vanessa Gobble, PA-C [address] [place] [address]   DIAGNOSIS: h/o pagets   SUMMARY OF ONCOLOGIC HISTORY: [facility] woman   (1) status post right mastectomy and axillary lymph node dissection in 1997 for a stage II, estrogen receptor positive invasive breast cancer, treated adjuvantly with chemotherapy (agents not retrievable), followed by tamoxifen for 6 years   (2) status post left excisional biopsy 03/28/2004 for ductal carcinoma in situ, grade 2, measuring 2 cm, with negative margins, estrogen and progesterone receptor positive. She received letrozole x6 years after this.   (3) greater than 40-pack-year smoking history, the patient quitting in 2001   (4) genetic testing 05/18/2020 through the Multi-Gene Panel found no deleterious mutations in AIP, ALK, APC, ATM, AXIN2,BAP1,  BARD1, BLM, BMPR1A, BRCA1, BRCA2, BRIP1, CASR, CDC73, CDH1, CDK4, CDKN1B, CDKN1C, CDKN2A (p14ARF), CDKN2A (p16INK4a), CEBPA, CHEK2, CTNNA1, DICER1, DIS3L2, EGFR (c.2369C>T, p.Thr790Met variant only), EPCAM (Deletion/duplication testing only), FH, FLCN, GATA2, GPC3, GREM1 (Promoter region deletion/duplication testing only), HOXB13 (c.251G>A, p.Gly84Glu), HRAS, KIT, MAX, MEN1, MET, MITF (c.952G>A, p.Glu318Lys variant only), MLH1, MSH2, MSH3, MSH6, MUTYH, NBN, NF1, NF2, NTHL1, PALB2, PDGFRA, PHOX2B, PMS2, POLD1, POLE, POT1, PRKAR1A, PTCH1, PTEN, RAD50, RAD51C, RAD51D, RB1, RECQL4, RET, RNF43, RUNX1, SDHAF2, SDHA (sequence changes only), SDHB, SDHC, SDHD, SMAD4, SMARCA4, SMARCB1, SMARCE1, STK11, SUFU, TERC, TERT, TMEM127, TP53, TSC1, TSC2, VHL, WRN and WT1.  (A) Variant of uncertain significance detected in [facility] at c.295T>C (p.Tyr99His).   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  She has undergone follow-up with GYN oncology and repeat biopsies and  treatment since 2012.  Most recently using Aldara.  Please see Dr. Winferd Humphrey notes in epic for more detail.  CURRENT THERAPY:  INTERVAL HISTORY:  Discussed the use of AI scribe software for clinical note transcription with the patient, who gave verbal consent to proceed.  [name] 78 y.o. female returns for    Patient Active Problem List   Diagnosis Date Noted  . Personal history of breast cancer 07/16/2020  . Family history of brain cancer 07/16/2020  . Family history of kidney cancer 07/16/2020  . Genetic testing 05/23/2020  . S/P reverse total shoulder arthroplasty, left 10/25/2019  . History of adenomatous polyp of colon 09/30/2018  . Shortness of breath   . ACS (acute coronary syndrome) (HCC) 04/11/2017  . Chest pain, rule out acute myocardial infarction 04/10/2017  . Bigeminy 04/10/2017  . Chronic cystitis 04/09/2017  . Thrombocytopenic disorder (HCC) 02/25/2017  . Osteoporosis 12/15/2016  . 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 Incomplete emptying of bladder 07/30/2016  . Left foot pain 12/11/2015  . 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 Stopped smoking with greater than 40 pack year history 10/23/2015  . 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 Mixed incontinence urge and stress 09/16/2011  . Cancer of right breast, stage 2 (HCC) 05/28/2011  . 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 Hypothyroid 05/28/2011  . GERD (gastroesophageal reflux disease) 05/28/2011  . Fibromyalgia 05/28/2011  . 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 Mild depression 05/28/2011  . Thrombocytopenia, unspecified (HCC) 05/28/2011  . Morbid obesity (HCC) 04/10/2011  . GERD 11/09/2009  . ALLERGIC RHINITIS 08/23/2007  . ASTHMA 08/23/2007  . COUGH 08/23/2007  . SKIN CANCER, HX OF 08/23/2007    is allergic to tape, hydromorphone  hcl, macrobid [nitrofurantoin], morphine and codeine, penicillins, and valisone  [betamethasone].  MEDICAL HISTORY: Past Medical History:  Diagnosis Date  . ACS (acute coronary syndrome) (HCC) 04/11/2017  . ALLERGIC RHINITIS 08/23/2007   Qualifier: Diagnosis of  By: Thad Ranger LPN, Megan    . Anxiety   . Arthritis   . Asthma   . ASTHMA 08/23/2007   Qualifier: Diagnosis of  By: Thad Ranger LPN, Megan    . Bigeminy 04/10/2017  . 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 Cancer of right breast, stage 2 (HCC) 05/28/2011   2.8 cm invasive 1 node pos ER pos S/P mastectomy 06/1995 then AC x 4 then Tam X 5   . Chest pain, rule out acute myocardial infarction 04/10/2017  . Chronic constipation   . Chronic cystitis 04/09/2017  . Cough 08/23/2007   Qualifier: Diagnosis of  By: Shelle Iron MD, Maree Krabbe   . Depression   . 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 Dysrhythmia 2020   Had Afib  . 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 Fibromyalgia   . GERD (gastroesophageal reflux disease)   . History of adenomatous polyp of colon 09/30/2018  . 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 History of colon polyps    2007 hyperplagia  . History of esophageal dilatation    2008  . History of peptic ulcer   . History of small bowel obstruction    2012  . Hypothyroid 05/28/2011  . Hypothyroidism   . Incomplete emptying of bladder 07/30/2016  . Left foot pain 12/11/2015   Overview:  Dr Candis Shine [facility]  . Macular degeneration of right eye   . Mild depression 05/28/2011  . Mixed incontinence urge and stress 09/16/2011   S/p lumax desires surgery   . Morbid obesity (HCC) 04/10/2011  . Numbness of left foot    4 toes  . Osteoporosis 12/15/2016   Overview:  Dr Estanislado Pandy  . 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HCC)   . Personal history of chemotherapy 1997  . Personal  history of radiation therapy 2005  . PONV (postoperative nausea and vomiting)    and HARD TO WAKE  . Seasonal allergies   . Shortness of breath   . SKIN CANCER, HX OF 08/23/2007   Qualifier: Diagnosis of  By: Thad Ranger LPN, Megan    . SUI (stress urinary incontinence, female)   . Thrombocytopenic disorder (HCC) 02/25/2017  . Wears glasses     SURGICAL HISTORY: Past Surgical History:  Procedure Laterality Date  . BLADDER SUSPENSION  10/13/2011   Procedure: TRANSVAGINAL TAPE (TVT) PROCEDURE;  Surgeon: Purcell Nails, MD;  Location: [facility];  Service: Gynecology;  Laterality: N/A;  . BREAST BIOPSY Left 03/19/2004  . BREAST LUMPECTOMY Left 03-27-2004  . CARDIAC CATHETERIZATION  10-30-2000  dr Donnie Aho   normal coronary arteries and LVF/  ef 65-70%  . CLOSED MANIPULATION  W/ OPEN REDUCTION EXCHANGE TIBIAL FEMORAL BEARING POST TOTAL LEFT KNEE  04-14-2001  . CYSTOSCOPY  10/13/2011   Procedure: CYSTOSCOPY;  Surgeon: Purcell Nails, MD;  Location: [facility];  Service: Gynecology;  Laterality: Bilateral;  . DIAGNOSTIC LAPAROSCOPY     Colon d/t SBO  . DILATION AND CURETTAGE OF UTERUS  1968  . FOOT SURGERY Left 02-01-2009  TRIPLE ARTHRODESIS  . FOOT SURGERY Left 11/14   I&D left foot with woundvac placement  . INCISIONAL HERNIA REPAIR  05/05/2011   Procedure: LAPAROSCOPIC INCISIONAL HERNIA;  Surgeon: Mariella Saa, MD;  Location: [facility];  Service: General;  Laterality: N/A;  LAPAROSCOPIC REPAIR INCARCERATED INCISIONAL HERNIA  WITH MESH  . KNEE ARTHROSCOPY Bilateral left 01-8118 & 04-1992/   right [id number]  . LAPAROSCOPIC CHOLECYSTECTOMY  06-21-2010  . LEFT HEART CATH AND CORONARY ANGIOGRAPHY N/A 04/13/2017   Procedure: LEFT HEART CATH AND CORONARY ANGIOGRAPHY;  Surgeon: Corky Crafts, MD;  Location: [facility] INVASIVE CV LAB;  Service: Cardiovascular;  Laterality: N/A;  . LUMBAR SPINE SURGERY  1998   L5 -- S1  . MASTECTOMY Right 1997   W/  LYMPH NODE DISSECTION AND RECONSTRUCTION WITH  IMPLANTS AND EXPANDER  . NEGATIVE SLEEP STUDY  12/26/2015  . PORT-A-CATH PLACEMENT AND REMOVAL  1997  . REMOVAL RIGHT BREAST IMPLANT AND CAPSULECTOMY  06-03-1999  . REVERSE SHOULDER ARTHROPLASTY Left 10/25/2019   Procedure: LEFT REVERSE SHOULDER ARTHROPLASTY;  Surgeon: Cammy Copa, MD;  Location: [facility] OR;  Service: Orthopedics;  Laterality: Left;  . REVISION TOTAL KNEE ARTHROPLASTY Left 07-07-2005  &  12-10-2001   12-10-2001  REMOVAL TOTAL KNEE/ I&D / INSERTION ANTIBIOTIC SPACER  . REVISION TOTAL KNEE ARTHROPLASTY Right 07-07-2005  . ROTATOR CUFF REPAIR Right   . TENDON REPAIR RIGHT RING FINGER  2011  . THUMB TRIGGER RELEASE Right 1993  . THYROIDECTOMY, PARTIAL  1983  . TONSILLECTOMY  1968  . TOTAL KNEE ARTHROPLASTY Bilateral right 05-04-2000/  left 03-29-2001  . VULVECTOMY  05/27/2012   Procedure: WIDE EXCISION VULVECTOMY;  Surgeon: Rejeana Brock A. Duard Brady, MD;  Location: [facility];  Service: Gynecology;  Laterality: N/A;  Wide Local Excision of Vulva   . VULVECTOMY N/A 11/23/2012   Procedure: WIDE LOCAL EXCISION OF THE VULVA;  Surgeon: Rejeana Brock A. Duard Brady, MD;  Location: [facility];  Service: Gynecology;  Laterality: N/A;  left inferior vulvar biopsy excision left superior lesion left inferior vulvar excision  . VULVECTOMY N/A 08/02/2013   Procedure: WIDE LOCAL  EXCISION VULVA;  Surgeon: Rejeana Brock A. Duard Brady, MD;  Location: [facility];  Service: Gynecology;  Laterality: N/A;  . VULVECTOMY Left 03/29/2014   Procedure: WIDE LOCAL EXCISION OF LEFT VULVA ;  Surgeon: Rejeana Brock A. Duard Brady, MD;  Location: [facility];  Service: Gynecology;  Laterality: Left;  Marland Kitchen VULVECTOMY Bilateral 01/24/2015   Procedure: WIDE LOCAL EXCISION VULVA;  Surgeon: Cleda Mccreedy, MD;  Location: [facility];  Service: Gynecology;  Laterality: Bilateral;  . VULVECTOMY PARTIAL  07-16-2010    SOCIAL HISTORY: Social History   Socioeconomic History  . Marital status: Married    Spouse name: Not on file  . Number of  children: Not on file  . Years of education: Not on file  . Highest education level: Not on file  Occupational History  . Not on file  Tobacco Use  . Smoking status: Former    Current packs/day: 0.00    Average packs/day: 2.0 packs/day for 41.0 years (82.0 ttl pk-yrs)    Types: Cigarettes    Start date: 06/04/1958    Quit date: 06/05/1999    Years since quitting: 24.2  . Smokeless tobacco: Never  Vaping Use  . Vaping status: Never Used  Substance and Sexual Activity  . Alcohol use: No  . Drug use: No  . Sexual activity: Not Currently    Birth control/protection: Post-menopausal  Other Topics Concern  .  Not on file  Social History Narrative  . Not on file   Social Drivers of Health   Financial Resource Strain: Low Risk  (08/12/2022)   Received from [facility], [facility]   Overall Financial Resource Strain (CARDIA)   . Difficulty of Paying Living Expenses: Not very hard  Food Insecurity: Low Risk  (02/02/2023)   Received from [facility]   Hunger Vital Sign   . Worried About Programme researcher, broadcasting/film/video in the Last Year: Never true   . Ran Out of Food in the Last Year: Never true  Transportation Needs: No Transportation Needs (02/02/2023)   Received from [facility]   . In the past 12 months, has lack of reliable transportation kept you from medical appointments, meetings, work or from getting things needed for daily living? : No  Physical Activity: Unknown (08/12/2022)   Received from [facility], [facility]   Exercise Vital Sign   . Days of Exercise per Week: 0 days   . Minutes of Exercise per Session: Not on file  Stress: No Stress Concern Present (08/12/2022)   Received from [facility], [facility] - Occupational Stress Questionnaire   . Feeling of Stress : Not at all  Social Connections: Unknown (09/29/2021)   Received from [facility], [facility]   Social Network   . Social Network: Not on file   Intimate Partner Violence: Not At Risk (08/12/2022)   Received from [facility], [facility]   HITS   . Over the last 12 months how often did your partner physically hurt you?: Never   . Over the last 12 months how often did your partner insult you or talk down to you?: Never   . Over the last 12 months how often did your partner threaten you with physical harm?: Never   . Over the last 12 months how often did your partner scream or curse at you?: Never    FAMILY HISTORY: Family History  Problem Relation Age of Onset  . Kidney cancer Father 8       metastatic   . Alzheimer's disease Mother   . Other Mother        ? Cancer  . Heart attack Maternal Grandmother   . Alcohol abuse Brother   . Bladder Cancer Brother        dx 38s  . Breast cancer Paternal Aunt        dx after 50  . Bladder Cancer Paternal Aunt        dx after 50  . Breast cancer Cousin        two paternal female cousins; unknown age of diagnosis  . Cancer Maternal Aunt        ? gallbladder; dx 76s  . Cancer Maternal Uncle        unknown type; ? pancreatic ? colon; dx after 50  . Brain cancer Paternal Uncle        unknown age of dx  . Melanoma Brother        dx late 50s-early 43s  . Bone cancer Maternal Uncle        dx after 50  . Leukemia Maternal Uncle        dx after 50  . Bone cancer Maternal Uncle        dx after 50  . Lung cancer Maternal Uncle        dx after 50  .  Brain cancer Cousin        two maternal female cousins; dx after 19  . Stomach cancer Paternal Aunt        unknown age of dx    Review of Systems  Constitutional:  Negative for appetite change, chills, fatigue, fever and unexpected weight change.  HENT:   Negative for hearing loss, lump/mass and trouble swallowing.   Eyes:  Negative for eye problems and icterus.  Respiratory:  Negative for chest tightness, cough and shortness of breath.   Cardiovascular:  Negative for chest pain, leg swelling and palpitations.  Gastrointestinal:   Negative for abdominal distention, abdominal pain, constipation, diarrhea, nausea and vomiting.  Endocrine: Negative for hot flashes.  Genitourinary:  Negative for difficulty urinating.   Musculoskeletal:  Negative for arthralgias.  Skin:  Negative for itching and rash.  Neurological:  Negative for dizziness, extremity weakness, headaches and numbness.  Hematological:  Negative for adenopathy. Does not bruise/bleed easily.  Psychiatric/Behavioral:  Negative for depression. The patient is not nervous/anxious.       PHYSICAL EXAMINATION    There were no vitals filed for this visit.  Physical Exam Constitutional:      General: She is not in acute distress.    Appearance: Normal appearance. She is not toxic-appearing.  HENT:     Head: Normocephalic and atraumatic.     Mouth/Throat:     Mouth: Mucous membranes are moist.     Pharynx: Oropharynx is clear. No oropharyngeal exudate or posterior oropharyngeal erythema.  Eyes:     General: No scleral icterus. Cardiovascular:     Rate and Rhythm: Normal rate and regular rhythm.     Pulses: Normal pulses.     Heart sounds: Normal heart sounds.  Pulmonary:     Effort: Pulmonary effort is normal.     Breath sounds: Normal breath sounds.  Abdominal:     General: Abdomen is flat. Bowel sounds are normal. There is no distension.     Palpations: Abdomen is soft.     Tenderness: There is no abdominal tenderness.  Musculoskeletal:        General: No swelling.     Cervical back: Neck supple.  Lymphadenopathy:     Cervical: No cervical adenopathy.  Skin:    General: Skin is warm and dry.     Findings: No rash.  Neurological:     General: No focal deficit present.     Mental Status: She is alert.  Psychiatric:        Mood and Affect: Mood normal.        Behavior: Behavior normal.    LABORATORY DATA:  CBC    Component Value Date/Time   WBC 5.1 02/17/2023 1639   WBC 4.8 09/02/2022 1037   WBC 4.2 10/21/2019 1133   RBC 4.42  02/17/2023 1639   RBC 4.49 09/02/2022 1037   HGB 12.7 02/17/2023 1639   HGB 13.4 10/23/2015 1542   HCT 40.1 02/17/2023 1639   HCT 40.8 10/23/2015 1542   PLT 140 (L) 02/17/2023 1639   MCV 91 02/17/2023 1639   MCV 87.3 10/23/2015 1542   MCH 28.7 02/17/2023 1639   MCH 29.2 09/02/2022 1037   MCHC 31.7 02/17/2023 1639   MCHC 32.7 09/02/2022 1037   RDW 13.1 02/17/2023 1639   RDW 13.6 10/23/2015 1542   LYMPHSABS 0.7 09/02/2022 1037   LYMPHSABS 1.4 10/23/2015 1542   MONOABS 0.3 09/02/2022 1037   MONOABS 0.3 10/23/2015 1542   EOSABS 0.1 09/02/2022 1037  EOSABS 0.1 10/23/2015 1542   BASOSABS 0.0 09/02/2022 1037   BASOSABS 0.0 10/23/2015 1542    CMP     Component Value Date/Time   NA 142 02/17/2023 1639   NA 143 10/23/2015 1542   K 4.3 02/17/2023 1639   K 4.7 10/23/2015 1542   CL 102 02/17/2023 1639   CO2 24 02/17/2023 1639   CO2 28 10/23/2015 1542   GLUCOSE 110 (H) 02/17/2023 1639   GLUCOSE 108 (H) 09/02/2022 1037   GLUCOSE 97 10/23/2015 1542   BUN 13 02/17/2023 1639   BUN 12.2 10/23/2015 1542   CREATININE 0.71 02/17/2023 1639   CREATININE 0.70 09/02/2022 1037   CREATININE 0.7 10/23/2015 1542   CALCIUM 9.6 02/17/2023 1639   CALCIUM 9.7 10/23/2015 1542   PROT 6.6 09/02/2022 1037   PROT 6.6 10/23/2015 1542   ALBUMIN 4.2 09/02/2022 1037   ALBUMIN 3.9 10/23/2015 1542   AST 17 09/02/2022 1037   AST 17 10/23/2015 1542   ALT 18 09/02/2022 1037   ALT 14 10/23/2015 1542   ALKPHOS 89 09/02/2022 1037   ALKPHOS 85 10/23/2015 1542   BILITOT 0.4 09/02/2022 1037   BILITOT 0.32 10/23/2015 1542   GFRNONAA >60 09/02/2022 1037   GFRAA >60 10/21/2019 1133     ASSESSMENT and THERAPY PLAN:   No problem-specific Assessment & Plan notes found for this encounter.     All questions were answered. The patient knows to call the clinic with any problems, questions or concerns. We can certainly see the patient much sooner if necessary.  Total encounter time:*** minutes*in face-to-face  visit time, chart review, lab review, care coordination, order entry, and documentation of the encounter time.    Lillard Anes, NP 09/02/23 10:51 AM Medical Oncology and Hematology [facility] [address] [place], [place] [address] Tel. [phone]    Fax. [phone]  *Total Encounter Time as defined by the Centers for Medicare and Medicaid Services includes, in addition to the face-to-face time of a patient visit (documented in the note above) non-face-to-face time: obtaining and reviewing outside history, ordering and reviewing medications, tests or procedures, care coordination (communications with other health care professionals or caregivers) and documentation in the medical record.

## 2023-09-03 NOTE — Assessment & Plan Note (Signed)
 [name] is a 78 year old woman with h/o stage II right breast cancer diagnosed in 1997 s/p mastectomy, chemotherapy, and antiestrogen therapy, and left breast DCIS diagnosed in 2005 status post lumpectomy and adjuvant antiestrogen therapy with letrozole for 5 years.  Breast Cancer.  She is on observation alone, undergoing annual right breast mammogram. - No sign of breast cancer recurrence - I asked my nurse to obtain patient mammogram from her gyn office. - Recommended continuing mammogram annually.    - Diet rich in fruits/vegetables recommended - Recommended limiting ultra processed food and ETOH. - RTC in 1 year for continued long term f/u.

## 2023-09-07 ENCOUNTER — Ambulatory Visit: Attending: Cardiology | Admitting: Cardiology

## 2023-09-07 ENCOUNTER — Encounter: Payer: Self-pay | Admitting: Cardiology

## 2023-09-07 VITALS — BP 122/68 | HR 68 | Ht 65.0 in | Wt 252.8 lb

## 2023-09-07 DIAGNOSIS — I5032 Chronic diastolic (congestive) heart failure: Secondary | ICD-10-CM

## 2023-09-07 DIAGNOSIS — I493 Ventricular premature depolarization: Secondary | ICD-10-CM

## 2023-09-07 MED ORDER — FUROSEMIDE 40 MG PO TABS: 40.0000 mg | ORAL_TABLET | Freq: Every day | ORAL | 1 refills | Status: DC

## 2023-09-07 NOTE — Progress Notes (Signed)
  Electrophysiology Office Note:   Date:  09/07/2023  ID:  [name], DOB 08-Oct-1945, MRN [id number]  Primary Cardiologist: None Primary Heart Failure: None Electrophysiologist: Maygan Koeller Cortland Ding, MD      History of Present Illness:   [name] is a 78 y.o. female with h/o diastolic heart failure, nonobstructive coronary artery disease, PVCs seen today for routine electrophysiology followup.   Since last being seen in our clinic the patient reports doing overall well.  She has no chest pain.  She does have shortness of breath.  She is short of breath when she exerts herself.  She has been out of her Lasix for the last 1.5 to 2 weeks.  She also has significant lower extremity edema..  she denies chest pain, palpitations, dyspnea, PND, orthopnea, nausea, vomiting, dizziness, syncope, edema, weight gain, or early satiety.   Review of systems complete and found to be negative unless listed in HPI.   EP Information / Studies Reviewed:    EKG is ordered today. Personal review as below.  EKG Interpretation Date/Time:  Monday September 07 2023 16:41:26 EDT Ventricular Rate:  68 PR Interval:  160 QRS Duration:  94 QT Interval:  412 QTC Calculation: 438 R Axis:   36  Text Interpretation: Normal sinus rhythm Normal ECG When compared with ECG of 17-Feb-2023 15:53, No significant change was found Confirmed by Kingslee Mairena ([id number]) on 09/07/2023 4:53:50 PM     Risk Assessment/Calculations:              Physical Exam:   VS:  BP 122/68 (BP Location: Left Arm, Patient Position: Sitting, Cuff Size: Large)   Pulse 68   Ht 5\' 5"  (1.651 m)   Wt 252 lb 12.8 oz (114.7 kg)   SpO2 96%   BMI 42.07 kg/m    Wt Readings from Last 3 Encounters:  09/07/23 252 lb 12.8 oz (114.7 kg)  09/02/23 249 lb 14.4 oz (113.4 kg)  02/17/23 250 lb 6.4 oz (113.6 kg)     GEN: Well nourished, well developed in no acute distress NECK: No JVD; No carotid bruits CARDIAC: Regular rate and rhythm, no murmurs,  rubs, gallops RESPIRATORY:  Clear to auscultation without rales, wheezing or rhonchi  ABDOMEN: Soft, non-tender, non-distended EXTREMITIES: 2+ edema; No deformity   ASSESSMENT AND PLAN:    1.  PVCs: 30% on Holter monitor.  Now on mexiletine with repeat monitor showing 8.4% burden.  She is feeling well without awareness of arrhythmia.  Charmel Pronovost continue with current management.  2.  Chronic diastolic heart failure: If again lower extremity edema and shortness of breath.  She has been out of her Lasix for the last week and 1/2 to 2 weeks.  Carlen Fils refill Lasix.  She Alton Tremblay let us  know if this does not improve her symptoms.  Follow up with EP APP in 6 months  Signed, Amanda Steuart Cortland Ding, MD

## 2023-09-07 NOTE — Patient Instructions (Signed)
 Medication Instructions:  Your physician recommends that you continue on your current medications as directed. Please refer to the Current Medication list given to you today.  *If you need a refill on your cardiac medications before your next appointment, please call your pharmacy*  Lab Work: None ordered.  If you have labs (blood work) drawn today and your tests are completely normal, you will receive your results only by: MyChart Message (if you have MyChart) OR A paper copy in the mail If you have any lab test that is abnormal or we need to change your treatment, we will call you to review the results.  Testing/Procedures: None ordered.   Follow-Up: At [facility], you and your health needs are our priority.  As part of our continuing mission to provide you with exceptional heart care, our providers are all part of one team.  This team includes your primary Cardiologist (physician) and Advanced Practice Providers or APPs (Physician Assistants and Nurse Practitioners) who all work together to provide you with the care you need, when you need it.  Your next appointment:   6 months with Vanessa Abrahams, PA-C      1st Floor: - Lobby - Registration  - Pharmacy  - Lab - Cafe  2nd Floor: - PV Lab - Diagnostic Testing (echo, CT, nuclear med)  3rd Floor: - Vacant  4th Floor: - TCTS (cardiothoracic surgery) - AFib Clinic - Structural Heart Clinic - Vascular Surgery  - Vascular Ultrasound  5th Floor: - HeartCare Cardiology (general and EP) - Clinical Pharmacy for coumadin, hypertension, lipid, weight-loss medications, and med management appointments    Valet parking services will be available as well.

## 2024-01-18 ENCOUNTER — Ambulatory Visit: Admitting: Orthopedic Surgery

## 2024-01-18 DIAGNOSIS — R200 Anesthesia of skin: Secondary | ICD-10-CM | POA: Diagnosis not present

## 2024-01-19 ENCOUNTER — Encounter: Payer: Self-pay | Admitting: Orthopedic Surgery

## 2024-01-19 NOTE — Progress Notes (Signed)
 Office Visit Note   Patient: [name]           Date of Birth: [birth year]           MRN: [id number] Visit Date: 01/18/2024 Requested by: Jacques Camie Pepper, PA-C [address] [place],  [place] [address] PCP: Jacques Camie Pepper, PA-C  Subjective: Chief Complaint  Patient presents with   Other    Bilateral wrist/hand pain    HPI: [name] is a 78 y.o. female who presents to the office reporting bilateral hand and wrist pain.  Patient has had bilateral CMC injections 325, 1124, 824, and 424,.  She also reports severe numbness and tingling in both hands.  She also describes loss of dexterity decreased strength and decreased grip.  Uses a walker at home as well as a cane when she is out.  She is right-hand dominant..                ROS: All systems reviewed are negative as they relate to the chief complaint within the history of present illness.  Patient denies fevers or chills. Assessment & Plan: Visit Diagnoses:  1. 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  I think her best option would be nerve conduction to evaluate what is likely severe carpal tunnel syndrome and subsequent referral for management of both problems at the same time in each wrist..  Follow-up after that study. Follow-Up Instructions: No follow-ups on file.   Orders:  Orders Placed This Encounter  Procedures   Ambulatory referral to Physical Medicine Rehab   No orders of the defined types were placed in this encounter.     Procedures: No procedures performed   Clinical Data: No additional findings.  Objective: Vital Signs: There were no vitals taken for this visit.  Physical Exam:  Constitutional: Patient appears well-developed HEENT:  Head: Normocephalic Eyes:EOM are normal Neck: Normal range of  motion Cardiovascular: Normal rate Pulmonary/chest: Effort normal Neurologic: Patient is alert Skin: Skin is warm Psychiatric: Patient has normal mood and affect  Ortho Exam: Ortho exam demonstrates some abductor pollicis brevis wasting in both hands EPL FPL interosseous strength is intact grip strength is diminished wrist range of motion intact positive grind test bilaterally.  Radial pulse intact bilaterally.  Specialty Comments:  No specialty comments available.  Imaging: No results found.   PMFS History: Patient Active Problem List   Diagnosis Date Noted   Personal history of breast cancer 07/16/2020   Family history of brain cancer 07/16/2020   Family history of kidney cancer 07/16/2020   Genetic testing 05/23/2020   S/P reverse total shoulder arthroplasty, left 10/25/2019   History of adenomatous polyp of colon 09/30/2018   Shortness of breath    ACS (acute coronary syndrome) (HCC) 04/11/2017   Chest pain, rule out acute myocardial infarction 04/10/2017   Bigeminy 04/10/2017   Chronic cystitis 04/09/2017   Thrombocytopenic disorder (HCC) 02/25/2017   Osteoporosis 12/15/2016   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   Incomplete emptying of bladder 07/30/2016   Left foot pain 12/11/2015   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   Stopped smoking with greater than 40 pack year history 10/23/2015   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   Mixed incontinence urge and stress 09/16/2011   Cancer of right breast, stage 2 (HCC)  05/28/2011   DCIS (ductal carcinoma in situ) of breast 05/28/2011   Hypothyroid 05/28/2011   GERD (gastroesophageal reflux disease) 05/28/2011   Fibromyalgia 05/28/2011   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   Mild depression 05/28/2011   Thrombocytopenia, unspecified (HCC) 05/28/2011   Morbid obesity (HCC) 04/10/2011   GERD 11/09/2009   ALLERGIC RHINITIS 08/23/2007   ASTHMA 08/23/2007   COUGH  08/23/2007   SKIN CANCER, HX OF 08/23/2007   Past Medical History:  Diagnosis Date   ACS (acute coronary syndrome) (HCC) 04/11/2017   ALLERGIC RHINITIS 08/23/2007   Qualifier: Diagnosis of  By: Germaine LPN, Megan     Anxiety    Arthritis    Asthma    ASTHMA 08/23/2007   Qualifier: Diagnosis of  By: Germaine LPN, Megan     Bigeminy 04/10/2017   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   Cancer of right breast, stage 2 (HCC) 05/28/2011   2.8 cm invasive 1 node pos ER pos S/P mastectomy 06/1995 then AC x 4 then Tam X 5    Chest pain, rule out acute myocardial infarction 04/10/2017   Chronic constipation    Chronic cystitis 04/09/2017   Cough 08/23/2007   Qualifier: Diagnosis of  By: Corrie MD, Francis HERO    Depression    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   Dysrhythmia 2020   Had Afib   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   Fibromyalgia    GERD (gastroesophageal reflux disease)    History of adenomatous polyp of colon 09/30/2018   History of breast cancer no recurrence   1997  right breast cancer  s/p  mastectomy w/ snl dissection and chemoradiation/  2005  left breast cancer DCIS  s/p  lumpectomy and radiation   History of colon polyps    2007 hyperplagia   History of esophageal dilatation    2008   History of peptic ulcer    History of small bowel obstruction    2012   Hypothyroid 05/28/2011   Hypothyroidism    Incomplete emptying of bladder 07/30/2016   Left foot pain 12/11/2015   Overview:  Dr [facility]   Macular degeneration of right eye    Mild depression 05/28/2011   Mixed incontinence urge and stress 09/16/2011   S/p lumax desires surgery    Morbid obesity (HCC) 04/10/2011   Numbness of left foot    4 toes   Osteoporosis 12/15/2016   Overview:  Dr Darcel   Paget's disease of vulva [facility])    Personal history of chemotherapy 1997   Personal history of radiation therapy  2005   PONV (postoperative nausea and vomiting)    and HARD TO WAKE   Seasonal allergies    Shortness of breath    SKIN CANCER, HX OF 08/23/2007   Qualifier: Diagnosis of  By: Germaine LPN, Megan     SUI (stress urinary incontinence, female)    Thrombocytopenic disorder (HCC) 02/25/2017   Wears glasses     Family History  Problem Relation Age of Onset   Kidney cancer Father 72       metastatic    Alzheimer's disease Mother    Other Mother        ? Cancer   Heart attack Maternal Grandmother    Alcohol  abuse Brother    Bladder Cancer Brother        dx 65s   Breast cancer Paternal Aunt  dx after 50   Bladder Cancer Paternal Aunt        dx after 59   Breast cancer Cousin        two paternal female cousins; unknown age of diagnosis   Cancer Maternal Aunt        ? gallbladder; dx 57s   Cancer Maternal Uncle        unknown type; ? pancreatic ? colon; dx after 5   Brain cancer Paternal Uncle        unknown age of dx   Melanoma Brother        dx late 50s-early 85s   Bone cancer Maternal Uncle        dx after 35   Leukemia Maternal Uncle        dx after 50   Bone cancer Maternal Uncle        dx after 50   Lung cancer Maternal Uncle        dx after 27   Brain cancer Cousin        two maternal female cousins; dx after 72   Stomach cancer Paternal Aunt        unknown age of dx    Past Surgical History:  Procedure Laterality Date   BLADDER SUSPENSION  10/13/2011   Procedure: TRANSVAGINAL TAPE (TVT) PROCEDURE;  Surgeon: Jon CINDERELLA Rummer, MD;  Location: [facility];  Service: Gynecology;  Laterality: N/A;   BREAST BIOPSY Left 03/19/2004   BREAST LUMPECTOMY Left 03-27-2004   CARDIAC CATHETERIZATION  10-30-2000  dr blanca   normal coronary arteries and LVF/  ef 65-70%   CLOSED MANIPULATION  W/ OPEN REDUCTION EXCHANGE TIBIAL FEMORAL BEARING POST TOTAL LEFT KNEE  04-14-2001   CYSTOSCOPY  10/13/2011   Procedure: CYSTOSCOPY;  Surgeon: Jon CINDERELLA Rummer, MD;  Location: [facility];   Service: Gynecology;  Laterality: Bilateral;   DIAGNOSTIC LAPAROSCOPY     Colon d/t SBO   DILATION AND CURETTAGE OF UTERUS  1968   FOOT SURGERY Left 02-01-2009   TRIPLE ARTHRODESIS   FOOT SURGERY Left 11/14   I&D left foot with woundvac placement   INCISIONAL HERNIA REPAIR  05/05/2011   Procedure: LAPAROSCOPIC INCISIONAL HERNIA;  Surgeon: Morene ONEIDA Olives, MD;  Location: [facility];  Service: General;  Laterality: N/A;  LAPAROSCOPIC REPAIR INCARCERATED INCISIONAL HERNIA  WITH MESH   KNEE ARTHROSCOPY Bilateral left 01-1984 & 04-1992/   right 09-1991   LAPAROSCOPIC CHOLECYSTECTOMY  06-21-2010   LEFT HEART CATH AND CORONARY ANGIOGRAPHY N/A 04/13/2017   Procedure: LEFT HEART CATH AND CORONARY ANGIOGRAPHY;  Surgeon: Dann Candyce RAMAN, MD;  Location: [facility];  Service: Cardiovascular;  Laterality: N/A;   LUMBAR SPINE SURGERY  1998   L5 -- S1   MASTECTOMY Right 1997   W/  LYMPH NODE DISSECTION AND RECONSTRUCTION WITH IMPLANTS AND EXPANDER   NEGATIVE SLEEP STUDY  12/26/2015   PORT-A-CATH PLACEMENT AND REMOVAL  1997   REMOVAL RIGHT BREAST IMPLANT AND CAPSULECTOMY  06-03-1999   REVERSE SHOULDER ARTHROPLASTY Left 10/25/2019   Procedure: LEFT REVERSE SHOULDER ARTHROPLASTY;  Surgeon: Addie Cordella Hamilton, MD;  Location: [facility];  Service: Orthopedics;  Laterality: Left;   REVISION TOTAL KNEE ARTHROPLASTY Left 07-07-2005  &  12-10-2001   12-10-2001  REMOVAL TOTAL KNEE/ I&D / INSERTION ANTIBIOTIC SPACER   REVISION TOTAL KNEE ARTHROPLASTY Right 07-07-2005   ROTATOR CUFF REPAIR Right    TENDON REPAIR RIGHT RING FINGER  2011   THUMB TRIGGER RELEASE Right 1993  THYROIDECTOMY, PARTIAL  1983   TONSILLECTOMY  1968   TOTAL KNEE ARTHROPLASTY Bilateral right 05-04-2000/  left 03-29-2001   VULVECTOMY  05/27/2012   Procedure: WIDE EXCISION VULVECTOMY;  Surgeon: Elenore A. Dodie, MD;  Location: [facility];  Service: Gynecology;  Laterality: N/A;  Wide Local Excision of Vulva    VULVECTOMY N/A 11/23/2012    Procedure: WIDE LOCAL EXCISION OF THE VULVA;  Surgeon: Elenore A. Dodie, MD;  Location: [facility];  Service: Gynecology;  Laterality: N/A;  left inferior vulvar biopsy excision left superior lesion left inferior vulvar excision   VULVECTOMY N/A 08/02/2013   Procedure: WIDE LOCAL  EXCISION VULVA;  Surgeon: Elenore A. Dodie, MD;  Location: [facility];  Service: Gynecology;  Laterality: N/A;   VULVECTOMY Left 03/29/2014   Procedure: WIDE LOCAL EXCISION OF LEFT VULVA ;  Surgeon: Elenore A. Dodie, MD;  Location[facility];  Service: Gynecology;  Laterality: Left;   VULVECTOMY Bilateral 01/24/2015   Procedure: WIDE LOCAL EXCISION VULVA;  Surgeon: Elenore Dodie, MD;  Location: [facility];  Service: Gynecology;  Laterality: Bilateral;   VULVECTOMY PARTIAL  07-16-2010   Social History   Occupational History   Not on file  Tobacco Use   Smoking status: Former    Current packs/day: 0.00    Average packs/day: 2.0 packs/day for 41.0 years (82.0 ttl pk-yrs)    Types: Cigarettes    Start date: 06/04/1958    Quit date: 06/05/1999    Years since quitting: 24.6   Smokeless tobacco: Never  Vaping Use   Vaping status: Never Used  Substance and Sexual Activity   Alcohol  use: No   Drug use: No   Sexual activity: Not Currently    Birth control/protection: Post-menopausal

## 2024-02-03 ENCOUNTER — Ambulatory Visit: Admitting: Physical Medicine and Rehabilitation

## 2024-02-03 DIAGNOSIS — M79641 Pain in right hand: Secondary | ICD-10-CM

## 2024-02-03 DIAGNOSIS — R29898 Other symptoms and signs involving the musculoskeletal system: Secondary | ICD-10-CM | POA: Diagnosis not present

## 2024-02-03 DIAGNOSIS — R202 Paresthesia of skin: Secondary | ICD-10-CM

## 2024-02-03 DIAGNOSIS — M1811 Unilateral primary osteoarthritis of first carpometacarpal joint, right hand: Secondary | ICD-10-CM

## 2024-02-03 DIAGNOSIS — M79642 Pain in left hand: Secondary | ICD-10-CM

## 2024-02-03 DIAGNOSIS — M1812 Unilateral primary osteoarthritis of first carpometacarpal joint, left hand: Secondary | ICD-10-CM

## 2024-02-03 NOTE — Progress Notes (Signed)
 Pain Scale   Average Pain 6 Patient advising she has bilateral hand pain, numbness,tingling and weakness. Patient is right hand dominate        +Driver, -BT, -Dye Allergies.

## 2024-02-08 ENCOUNTER — Other Ambulatory Visit: Payer: Self-pay | Admitting: Obstetrics and Gynecology

## 2024-02-08 DIAGNOSIS — Z1231 Encounter for screening mammogram for malignant neoplasm of breast: Secondary | ICD-10-CM

## 2024-02-08 NOTE — Procedures (Signed)
 EMG & NCV Findings: Evaluation of the left median motor and the right median motor nerves showed reduced amplitude (L2.0, R4.3 mV).  The left median (across palm) sensory nerve showed prolonged distal peak latency (3.8 ms).  All remaining nerves (as indicated in the following tables) were within normal limits.  Left vs. Right side comparison data for the median motor nerve indicates abnormal L-R latency difference (0.8 ms).    All examined muscles (as indicated in the following table) showed no evidence of electrical instability.    Impression: Essentially NORMAL electrodiagnostic study of the right upper limb.  There is no significant electrodiagnostic evidence of nerve entrapment, brachial plexopathy or cervical radiculopathy.    As you know, purely sensory or demyelinating radiculopathies and chemical radiculitis may not be detected with this particular electrodiagnostic study. **This electrodiagnostic study cannot rule out small fiber polyneuropathy and dysesthesias from central pain syndromes such as stroke or central pain sensitization syndromes such as fibromyalgia.  Myotomal referral pain from trigger points is also not excluded.  Recommendations: 1.  Follow-up with referring physician. 2.  Continue current management of symptoms.  ___________________________ Prentice Masters FAAPMR Board Certified, American Board of Physical Medicine and Rehabilitation    Nerve Conduction Studies Anti Sensory Summary Table   Stim Site NR Peak (ms) Norm Peak (ms) P-T Amp (V) Norm P-T Amp Site1 Site2 Delta-P (ms) Dist (cm) Vel (m/s) Norm Vel (m/s)  Left Median Acr Palm Anti Sensory (2nd Digit)  31.2C  Wrist    *3.8 <3.6 32.3 >10        Right Median Acr Palm Anti Sensory (2nd Digit)  32.2C  Wrist    3.6 <3.6 20.6 >10 Wrist Palm 1.6 0.0    Palm    2.0 <2.0 23.5         Right Radial Anti Sensory (Base 1st Digit)  32C  Wrist    2.1 <3.1 18.9  Wrist Base 1st Digit 2.1 0.0    Right Ulnar Anti Sensory  (5th Digit)  32.6C  Wrist    3.3 <3.7 19.4 >15.0 Wrist 5th Digit 3.3 14.0 42 >38   Motor Summary Table   Stim Site NR Onset (ms) Norm Onset (ms) O-P Amp (mV) Norm O-P Amp Site1 Site2 Delta-0 (ms) Dist (cm) Vel (m/s) Norm Vel (m/s)  Left Median Motor (Abd Poll Brev)  32.1C  Wrist    4.2 <4.2 *2.0 >5 Elbow Wrist 3.7 19.5 53 >50  Elbow    7.9  1.6         Right Median Motor (Abd Poll Brev)  32.4C  Wrist    3.4 <4.2 *4.3 >5 Elbow Wrist 3.9 20.0 51 >50  Elbow    7.3  4.1         Right Ulnar Motor (Abd Dig Min)  32.5C  Wrist    2.9 <4.2 5.7 >3 B Elbow Wrist 3.2 19.0 59 >53  B Elbow    6.1  6.5  A Elbow B Elbow 1.2 10.0 83 >53  A Elbow    7.3  6.2          EMG   Side Muscle Nerve Root Ins Act Fibs Psw Amp Dur Poly Recrt Int Bruna Comment  Right Abd Poll Brev Median C8-T1 Nml Nml Nml Nml Nml 0 Nml Nml   Right 1stDorInt Ulnar C8-T1 Nml Nml Nml Nml Nml 0 Nml Nml   Right PronatorTeres Median C6-7 Nml Nml Nml Nml Nml 0 Nml Nml   Right Biceps Musculocut C5-6  Nml Nml Nml Nml Nml 0 Nml Nml   Right Deltoid Axillary C5-6 Nml Nml Nml Nml Nml 0 Nml Nml     Nerve Conduction Studies Anti Sensory Left/Right Comparison   Stim Site L Lat (ms) R Lat (ms) L-R Lat (ms) L Amp (V) R Amp (V) L-R Amp (%) Site1 Site2 L Vel (m/s) R Vel (m/s) L-R Vel (m/s)  Median Acr Palm Anti Sensory (2nd Digit)  31.2C  Wrist *3.8 3.6 0.2 32.3 20.6 36.2       Radial Anti Sensory (Base 1st Digit)  32C  Wrist  2.1   18.9  Wrist Base 1st Digit     Ulnar Anti Sensory (5th Digit)  32.6C  Wrist  3.3   19.4  Wrist 5th Digit  42    Motor Left/Right Comparison   Stim Site L Lat (ms) R Lat (ms) L-R Lat (ms) L Amp (mV) R Amp (mV) L-R Amp (%) Site1 Site2 L Vel (m/s) R Vel (m/s) L-R Vel (m/s)  Median Motor (Abd Poll Brev)  32.1C  Wrist 4.2 3.4 *0.8 *2.0 *4.3 53.5 Elbow Wrist 53 51 2  Elbow 7.9 7.3 0.6 1.6 4.1 61.0       Ulnar Motor (Abd Dig Min)  32.5C  Wrist  2.9   5.7  B Elbow Wrist  59   B Elbow  6.1   6.5  A Elbow B  Elbow  83   A Elbow  7.3   6.2           Waveforms:

## 2024-02-15 ENCOUNTER — Encounter: Payer: Self-pay | Admitting: Physical Medicine and Rehabilitation

## 2024-02-15 NOTE — Progress Notes (Signed)
 [name] - 78 y.o. female MRN [id number]  Date of birth: May 19, 1946  Office Visit Note: Visit Date: 02/03/2024 PCP: Jacques Camie Pepper, PA-C Referred by: Addie Cordella Hamilton, MD  Subjective: Chief Complaint  Patient presents with   Right Hand - Numbness, Weakness, Pain   Left Hand - Pain, Numbness, Weakness   HPI: [name] is a 78 y.o. female who comes in today at the request of Dr. JUDITHANN Hamilton Addie for evaluation and management of chronic, worsening and severe pain, numbness and tingling in the Bilateral upper extremities.  Patient is Right hand dominant.  She reports several year history of just worsening bilateral hand pain.  She has been seeing Dr. Addie for many months now receiving bilateral [facility] joint injections with good relief of some of her thumb pain.  She reports bilateral hand pain worse with gripping objects.  She does get significant numbness and tingling that used to be more nocturnal but now has constant during the day.  She reports difficulty with dexterity and manipulating small objects and dropping things.  She is hypothyroid with history of thyroidectomy but medicated.  She does not have diabetes.  He has a history of fibromyalgia.  No history of polyneuropathy.  She has no pain referring down the arms into the hands.  She has had no prior cervical surgery.    I spent more than 30 minutes speaking face-to-face with the patient with 50% of the time in counseling and discussing coordination of care.       Review of Systems  Musculoskeletal:  Positive for back pain, joint pain and neck pain.  Neurological:  Positive for tingling and focal weakness.  All other systems reviewed and are negative.  Otherwise per HPI.  Assessment & Plan: Visit Diagnoses:    ICD-10-CM   1. Paresthesia of skin  R20.2 NCV with EMG (electromyography)    2. Pain in left hand  M79.642     3. Pain in right hand  M79.641     4. Weakness of both hands  R29.898     5. Arthritis  of carpometacarpal (CMC) joint of left thumb  M18.12     6. Arthritis of carpometacarpal (CMC) joint of right thumb  M18.11        Plan: Impression: Clinically likely has some underlying carpal tunnel syndrome but symptoms more consistent with [facility] joint arthritis pain.  Complicated by fibromyalgia.  Electrodiagnostic study performed today.  Essentially NORMAL electrodiagnostic study of the right upper limb.  There is no significant electrodiagnostic evidence of nerve entrapment, brachial plexopathy or cervical radiculopathy.    As you know, purely sensory or demyelinating radiculopathies and chemical radiculitis may not be detected with this particular electrodiagnostic study. **This electrodiagnostic study cannot rule out small fiber polyneuropathy and dysesthesias from central pain syndromes such as stroke or central pain sensitization syndromes such as fibromyalgia.  Myotomal referral pain from trigger points is also not excluded.  Recommendations: 1.  Follow-up with referring physician. 2.  Continue current management of symptoms.  Meds & Orders: No orders of the defined types were placed in this encounter.   Orders Placed This Encounter  Procedures   NCV with EMG (electromyography)    Follow-up: Return for  G. Hamilton Addie, MD.   Procedures: No procedures performed  EMG & NCV Findings: Evaluation of the left median motor and the right median motor nerves showed reduced amplitude (L2.0, R4.3 mV).  The left median (across palm) sensory nerve showed prolonged  distal peak latency (3.8 ms).  All remaining nerves (as indicated in the following tables) were within normal limits.  Left vs. Right side comparison data for the median motor nerve indicates abnormal L-R latency difference (0.8 ms).    All examined muscles (as indicated in the following table) showed no evidence of electrical instability.    Impression: Essentially NORMAL electrodiagnostic study of the right upper limb.  There is  no significant electrodiagnostic evidence of nerve entrapment, brachial plexopathy or cervical radiculopathy.    As you know, purely sensory or demyelinating radiculopathies and chemical radiculitis may not be detected with this particular electrodiagnostic study. **This electrodiagnostic study cannot rule out small fiber polyneuropathy and dysesthesias from central pain syndromes such as stroke or central pain sensitization syndromes such as fibromyalgia.  Myotomal referral pain from trigger points is also not excluded.  Recommendations: 1.  Follow-up with referring physician. 2.  Continue current management of symptoms.  ___________________________ Prentice Masters FAAPMR Board Certified, American Board of Physical Medicine and Rehabilitation    Nerve Conduction Studies Anti Sensory Summary Table   Stim Site NR Peak (ms) Norm Peak (ms) P-T Amp (V) Norm P-T Amp Site1 Site2 Delta-P (ms) Dist (cm) Vel (m/s) Norm Vel (m/s)  Left Median Acr Palm Anti Sensory (2nd Digit)  31.2C  Wrist    *3.8 <3.6 32.3 >10        Right Median Acr Palm Anti Sensory (2nd Digit)  32.2C  Wrist    3.6 <3.6 20.6 >10 Wrist Palm 1.6 0.0    Palm    2.0 <2.0 23.5         Right Radial Anti Sensory (Base 1st Digit)  32C  Wrist    2.1 <3.1 18.9  Wrist Base 1st Digit 2.1 0.0    Right Ulnar Anti Sensory (5th Digit)  32.6C  Wrist    3.3 <3.7 19.4 >15.0 Wrist 5th Digit 3.3 14.0 42 >38   Motor Summary Table   Stim Site NR Onset (ms) Norm Onset (ms) O-P Amp (mV) Norm O-P Amp Site1 Site2 Delta-0 (ms) Dist (cm) Vel (m/s) Norm Vel (m/s)  Left Median Motor (Abd Poll Brev)  32.1C  Wrist    4.2 <4.2 *2.0 >5 Elbow Wrist 3.7 19.5 53 >50  Elbow    7.9  1.6         Right Median Motor (Abd Poll Brev)  32.4C  Wrist    3.4 <4.2 *4.3 >5 Elbow Wrist 3.9 20.0 51 >50  Elbow    7.3  4.1         Right Ulnar Motor (Abd Dig Min)  32.5C  Wrist    2.9 <4.2 5.7 >3 B Elbow Wrist 3.2 19.0 59 >53  B Elbow    6.1  6.5  A Elbow B Elbow 1.2  10.0 83 >53  A Elbow    7.3  6.2          EMG   Side Muscle Nerve Root Ins Act Fibs Psw Amp Dur Poly Recrt Int Bruna Comment  Right Abd Poll Brev Median C8-T1 Nml Nml Nml Nml Nml 0 Nml Nml   Right 1stDorInt Ulnar C8-T1 Nml Nml Nml Nml Nml 0 Nml Nml   Right PronatorTeres Median C6-7 Nml Nml Nml Nml Nml 0 Nml Nml   Right Biceps Musculocut C5-6 Nml Nml Nml Nml Nml 0 Nml Nml   Right Deltoid Axillary C5-6 Nml Nml Nml Nml Nml 0 Nml Nml     Nerve Conduction Studies Anti Sensory  Left/Right Comparison   Stim Site L Lat (ms) R Lat (ms) L-R Lat (ms) L Amp (V) R Amp (V) L-R Amp (%) Site1 Site2 L Vel (m/s) R Vel (m/s) L-R Vel (m/s)  Median Acr Palm Anti Sensory (2nd Digit)  31.2C  Wrist *3.8 3.6 0.2 32.3 20.6 36.2       Radial Anti Sensory (Base 1st Digit)  32C  Wrist  2.1   18.9  Wrist Base 1st Digit     Ulnar Anti Sensory (5th Digit)  32.6C  Wrist  3.3   19.4  Wrist 5th Digit  42    Motor Left/Right Comparison   Stim Site L Lat (ms) R Lat (ms) L-R Lat (ms) L Amp (mV) R Amp (mV) L-R Amp (%) Site1 Site2 L Vel (m/s) R Vel (m/s) L-R Vel (m/s)  Median Motor (Abd Poll Brev)  32.1C  Wrist 4.2 3.4 *0.8 *2.0 *4.3 53.5 Elbow Wrist 53 51 2  Elbow 7.9 7.3 0.6 1.6 4.1 61.0       Ulnar Motor (Abd Dig Min)  32.5C  Wrist  2.9   5.7  B Elbow Wrist  59   B Elbow  6.1   6.5  A Elbow B Elbow  83   A Elbow  7.3   6.2           Waveforms:                Clinical History: No specialty comments available.   She reports that she quit smoking about 24 years ago. Her smoking use included cigarettes. She started smoking about 65 years ago. She has a 82 pack-year smoking history. She has never used smokeless tobacco. No results for input(s): HGBA1C, LABURIC in the last 8760 hours.  Objective:  VS:  HT:    WT:   BMI:     BP:   HR: bpm  TEMP: ( )  RESP:  Physical Exam Vitals and nursing note reviewed.  Constitutional:      General: She is not in acute distress.    Appearance: Normal  appearance. She is well-developed. She is obese. She is not ill-appearing.  HENT:     Head: Normocephalic and atraumatic.  Eyes:     Conjunctiva/sclera: Conjunctivae normal.     Pupils: Pupils are equal, round, and reactive to light.  Cardiovascular:     Rate and Rhythm: Normal rate.     Pulses: Normal pulses.  Pulmonary:     Effort: Pulmonary effort is normal.  Musculoskeletal:        General: Tenderness present. No swelling or deformity.     Right lower leg: No edema.     Left lower leg: No edema.     Comments: Inspection reveals no significant CMC joint arthritic changes bilaterally but atrophy of the bilateral APB or FDI or hand intrinsics. There is no swelling, color changes, allodynia or dystrophic changes. There is 5 out of 5 strength in the bilateral wrist extension, finger abduction and long finger flexion. There is intact sensation to light touch in all dermatomal and peripheral nerve distributions. There is a negative Froment's test bilaterally. There is a negative Tinel's test at the bilateral wrist and elbow. There is a equivocal Phalen's test bilaterally. There is a negative Hoffmann's test bilaterally.  Skin:    General: Skin is warm and dry.     Findings: No erythema or rash.  Neurological:     General: No focal deficit present.     Mental  Status: She is alert and oriented to person, place, and time.     Sensory: No sensory deficit.     Motor: No weakness or abnormal muscle tone.     Coordination: Coordination normal.     Gait: Gait normal.  Psychiatric:        Mood and Affect: Mood normal.        Behavior: Behavior normal.     Ortho Exam  Imaging: No results found.  Past Medical/Family/Surgical/Social History: Medications & Allergies reviewed per EMR, new medications updated. 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   Incomplete emptying of bladder 07/30/2016   Left foot pain 12/11/2015   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   Stopped smoking with greater than 40 pack year history 10/23/2015   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   Mixed incontinence urge and stress 09/16/2011   Cancer of right breast, stage 2 (HCC) 05/28/2011   DCIS (ductal carcinoma in situ) of breast 05/28/2011   Hypothyroid 05/28/2011   GERD (gastroesophageal reflux disease) 05/28/2011   Fibromyalgia 05/28/2011   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   Mild depression 05/28/2011   Thrombocytopenia, unspecified (HCC) 05/28/2011   Morbid obesity (HCC) 04/10/2011   GERD 11/09/2009   ALLERGIC RHINITIS 08/23/2007   ASTHMA 08/23/2007   COUGH 08/23/2007   SKIN CANCER, HX OF 08/23/2007   Past Medical History:  Diagnosis Date   ACS (acute coronary syndrome) (HCC) 04/11/2017   ALLERGIC RHINITIS 08/23/2007   Qualifier: Diagnosis of  By: Germaine LPN, Megan     Anxiety    Arthritis    Asthma    ASTHMA 08/23/2007   Qualifier: Diagnosis of  By: Germaine LPN, Megan     Bigeminy 04/10/2017   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   Cancer of right breast, stage 2 (HCC) 05/28/2011   2.8 cm invasive 1 node pos ER pos S/P mastectomy 06/1995 then AC x 4 then Tam X 5    Chest pain, rule out acute myocardial infarction 04/10/2017   Chronic constipation    Chronic cystitis 04/09/2017   Cough 08/23/2007   Qualifier: Diagnosis  of  By: Corrie MD, Francis HERO    Depression    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   Dysrhythmia 2020   Had Afib   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   Fibromyalgia    GERD (gastroesophageal reflux disease)    History of adenomatous polyp of colon 09/30/2018   History of breast cancer no recurrence   1997  right breast cancer  s/p  mastectomy w/ snl dissection and chemoradiation/  2005  left breast cancer DCIS  s/p  lumpectomy and radiation   History of colon polyps    2007 hyperplagia   History of esophageal dilatation    2008   History of peptic ulcer  History of small bowel obstruction    2012   Hypothyroid 05/28/2011   Hypothyroidism    Incomplete emptying of bladder 07/30/2016   Left foot pain 12/11/2015   Overview:  Dr [facility]   Macular degeneration of right eye    Mild depression 05/28/2011   Mixed incontinence urge and stress 09/16/2011   S/p lumax desires surgery    Morbid obesity (HCC) 04/10/2011   Numbness of left foot    4 toes   Osteoporosis 12/15/2016   Overview:  Dr Darcel   Paget's disease of vulva [facility])    Personal history of chemotherapy 1997   Personal history of radiation therapy 2005   PONV (postoperative nausea and vomiting)    and HARD TO WAKE   Seasonal allergies    Shortness of breath    SKIN CANCER, HX OF 08/23/2007   Qualifier: Diagnosis of  By: Germaine LPN, Megan     SUI (stress urinary incontinence, female)    Thrombocytopenic disorder (HCC) 02/25/2017   Wears glasses    Family History  Problem Relation Age of Onset   Kidney cancer Father 65       metastatic    Alzheimer's disease Mother    Other Mother        ? Cancer   Heart attack Maternal Grandmother    Alcohol  abuse Brother    Bladder Cancer Brother        dx 18s   Breast cancer Paternal Aunt        dx after 78   Bladder Cancer Paternal Aunt        dx after 15   Breast cancer Cousin         two paternal female cousins; unknown age of diagnosis   Cancer Maternal Aunt        ? gallbladder; dx 78s   Cancer Maternal Uncle        unknown type; ? pancreatic ? colon; dx after 12   Brain cancer Paternal Uncle        unknown age of dx   Melanoma Brother        dx late 50s-early 70s   Bone cancer Maternal Uncle        dx after 84   Leukemia Maternal Uncle        dx after 50   Bone cancer Maternal Uncle        dx after 50   Lung cancer Maternal Uncle        dx after 38   Brain cancer Cousin        two maternal female cousins; dx after 63   Stomach cancer Paternal Aunt        unknown age of dx   Past Surgical History:  Procedure Laterality Date   BLADDER SUSPENSION  10/13/2011   Procedure: TRANSVAGINAL TAPE (TVT) PROCEDURE;  Surgeon: Jon CINDERELLA Rummer, MD;  Location: [facility];  Service: Gynecology;  Laterality: N/A;   BREAST BIOPSY Left 03/19/2004   BREAST LUMPECTOMY Left 03-27-2004   CARDIAC CATHETERIZATION  10-30-2000  dr blanca   normal coronary arteries and LVF/  ef 65-70%   CLOSED MANIPULATION  W/ OPEN REDUCTION EXCHANGE TIBIAL FEMORAL BEARING POST TOTAL LEFT KNEE  04-14-2001   CYSTOSCOPY  10/13/2011   Procedure: CYSTOSCOPY;  Surgeon: Jon CINDERELLA Rummer, MD;  Location: [facility];  Service: Gynecology;  Laterality: Bilateral;   DIAGNOSTIC LAPAROSCOPY     Colon d/t SBO   DILATION AND CURETTAGE OF UTERUS  1968   FOOT SURGERY Left 02-01-2009   TRIPLE ARTHRODESIS   FOOT SURGERY Left 11/14   I&D left foot with woundvac placement   INCISIONAL HERNIA REPAIR  05/05/2011   Procedure: LAPAROSCOPIC INCISIONAL HERNIA;  Surgeon: Morene ONEIDA Olives, MD;  Location: [facility];  Service: General;  Laterality: N/A;  LAPAROSCOPIC REPAIR INCARCERATED INCISIONAL HERNIA  WITH MESH   KNEE ARTHROSCOPY Bilateral left 01-1984 & 04-1992/   right [phone]   LAPAROSCOPIC CHOLECYSTECTOMY  06-21-2010   LEFT HEART CATH AND CORONARY ANGIOGRAPHY N/A 04/13/2017   Procedure: LEFT HEART CATH AND CORONARY ANGIOGRAPHY;   Surgeon: Dann Candyce RAMAN, MD;  Location: [facility];  Service: Cardiovascular;  Laterality: N/A;   LUMBAR SPINE SURGERY  1998   L5 -- S1   MASTECTOMY Right 1997   W/  LYMPH NODE DISSECTION AND RECONSTRUCTION WITH IMPLANTS AND EXPANDER   NEGATIVE SLEEP STUDY  12/26/2015   PORT-A-CATH PLACEMENT AND REMOVAL  1997   REMOVAL RIGHT BREAST IMPLANT AND CAPSULECTOMY  06-03-1999   REVERSE SHOULDER ARTHROPLASTY Left 10/25/2019   Procedure: LEFT REVERSE SHOULDER ARTHROPLASTY;  Surgeon: Addie Cordella Hamilton, MD;  Location: [facility];  Service: Orthopedics;  Laterality: Left;   REVISION TOTAL KNEE ARTHROPLASTY Left 07-07-2005  &  12-10-2001   12-10-2001  REMOVAL TOTAL KNEE/ I&D / INSERTION ANTIBIOTIC SPACER   REVISION TOTAL KNEE ARTHROPLASTY Right 07-07-2005   ROTATOR CUFF REPAIR Right    TENDON REPAIR RIGHT RING FINGER  2011   THUMB TRIGGER RELEASE Right 1993   THYROIDECTOMY, PARTIAL  1983   TONSILLECTOMY  1968   TOTAL KNEE ARTHROPLASTY Bilateral right 05-04-2000/  left 03-29-2001   VULVECTOMY  05/27/2012   Procedure: WIDE EXCISION VULVECTOMY;  Surgeon: Elenore A. Dodie, MD;  Location: [facility];  Service: Gynecology;  Laterality: N/A;  Wide Local Excision of Vulva    VULVECTOMY N/A 11/23/2012   Procedure: WIDE LOCAL EXCISION OF THE VULVA;  Surgeon: Elenore A. Dodie, MD;  Location: [facility];  Service: Gynecology;  Laterality: N/A;  left inferior vulvar biopsy excision left superior lesion left inferior vulvar excision   VULVECTOMY N/A 08/02/2013   Procedure: WIDE LOCAL  EXCISION VULVA;  Surgeon: Elenore A. Dodie, MD;  Location: [facility];  Service: Gynecology;  Laterality: N/A;   VULVECTOMY Left 03/29/2014   Procedure: WIDE LOCAL EXCISION OF LEFT VULVA ;  Surgeon: Elenore A. Dodie, MD;  [facility];  Service: Gynecology;  Laterality: Left;   VULVECTOMY Bilateral 01/24/2015   Procedure: WIDE LOCAL EXCISION VULVA;  Surgeon: Elenore Dodie, MD;  Location: [facility] [place];   Service: Gynecology;  Laterality: Bilateral;   VULVECTOMY PARTIAL  07-16-2010   Social History   Occupational History   Not on file  Tobacco Use   Smoking status: Former    Current packs/day: 0.00    Average packs/day: 2.0 packs/day for 41.0 years (82.0 ttl pk-yrs)    Types: Cigarettes    Start date: 06/04/1958    Quit date: 06/05/1999    Years since quitting: 24.7   Smokeless tobacco: Never  Vaping Use   Vaping status: Never Used  Substance and Sexual Activity   Alcohol  use: No   Drug use: No   Sexual activity: Not Currently    Birth control/protection: Post-menopausal

## 2024-02-20 ENCOUNTER — Other Ambulatory Visit: Payer: Self-pay | Admitting: Cardiology

## 2024-02-27 ENCOUNTER — Other Ambulatory Visit: Payer: Self-pay | Admitting: Physician Assistant

## 2024-02-27 ENCOUNTER — Other Ambulatory Visit: Payer: Self-pay | Admitting: Cardiology

## 2024-03-28 ENCOUNTER — Encounter: Payer: Self-pay | Admitting: Radiology

## 2024-06-30 ENCOUNTER — Other Ambulatory Visit: Payer: Self-pay

## 2024-06-30 ENCOUNTER — Ambulatory Visit: Admitting: Surgical

## 2024-06-30 DIAGNOSIS — M25561 Pain in right knee: Secondary | ICD-10-CM

## 2024-06-30 MED ORDER — HYDROCODONE-ACETAMINOPHEN 5-325 MG PO TABS: ORAL_TABLET | ORAL | 0 refills | Status: AC

## 2024-07-01 ENCOUNTER — Encounter: Payer: Self-pay | Admitting: Surgical

## 2024-07-04 ENCOUNTER — Other Ambulatory Visit

## 2024-07-28 ENCOUNTER — Ambulatory Visit: Admitting: Cardiology

## 2024-08-09 ENCOUNTER — Ambulatory Visit: Admitting: Physician Assistant

## 2024-09-01 ENCOUNTER — Inpatient Hospital Stay

## 2024-09-01 ENCOUNTER — Encounter: Admitting: Adult Health
